# Patient Record
Sex: Male | Born: 1955 | Race: White | Hispanic: No | Marital: Married | Smoking: Former smoker
Health system: Southern US, Academic
[De-identification: ages and names within clinical notes are randomized; demographics above are authoritative.]

## PROBLEM LIST (undated history)

## (undated) ENCOUNTER — Encounter (HOSPITAL_COMMUNITY): Admission: RE | Payer: Self-pay | Source: Ambulatory Visit

## (undated) DIAGNOSIS — R12 Heartburn: Secondary | ICD-10-CM

## (undated) DIAGNOSIS — I82409 Acute embolism and thrombosis of unspecified deep veins of unspecified lower extremity: Secondary | ICD-10-CM

## (undated) DIAGNOSIS — M479 Spondylosis, unspecified: Secondary | ICD-10-CM

## (undated) DIAGNOSIS — L719 Rosacea, unspecified: Secondary | ICD-10-CM

## (undated) DIAGNOSIS — C439 Malignant melanoma of skin, unspecified: Secondary | ICD-10-CM

## (undated) DIAGNOSIS — G4730 Sleep apnea, unspecified: Secondary | ICD-10-CM

## (undated) DIAGNOSIS — R6889 Other general symptoms and signs: Secondary | ICD-10-CM

## (undated) DIAGNOSIS — H9319 Tinnitus, unspecified ear: Secondary | ICD-10-CM

## (undated) DIAGNOSIS — M1990 Unspecified osteoarthritis, unspecified site: Secondary | ICD-10-CM

## (undated) DIAGNOSIS — M47815 Spondylosis without myelopathy or radiculopathy, thoracolumbar region: Secondary | ICD-10-CM

## (undated) DIAGNOSIS — I82502 Chronic embolism and thrombosis of unspecified deep veins of left lower extremity: Secondary | ICD-10-CM

## (undated) DIAGNOSIS — K589 Irritable bowel syndrome without diarrhea: Secondary | ICD-10-CM

## (undated) DIAGNOSIS — G8929 Other chronic pain: Secondary | ICD-10-CM

## (undated) DIAGNOSIS — Z6841 Body Mass Index (BMI) 40.0 and over, adult: Secondary | ICD-10-CM

## (undated) DIAGNOSIS — Z973 Presence of spectacles and contact lenses: Secondary | ICD-10-CM

## (undated) DIAGNOSIS — K219 Gastro-esophageal reflux disease without esophagitis: Secondary | ICD-10-CM

## (undated) DIAGNOSIS — E785 Hyperlipidemia, unspecified: Secondary | ICD-10-CM

## (undated) DIAGNOSIS — M539 Dorsopathy, unspecified: Secondary | ICD-10-CM

## (undated) DIAGNOSIS — I10 Essential (primary) hypertension: Secondary | ICD-10-CM

## (undated) DIAGNOSIS — Z9989 Dependence on other enabling machines and devices: Secondary | ICD-10-CM

## (undated) DIAGNOSIS — L309 Dermatitis, unspecified: Secondary | ICD-10-CM

## (undated) DIAGNOSIS — R569 Unspecified convulsions: Secondary | ICD-10-CM

## (undated) DIAGNOSIS — K635 Polyp of colon: Secondary | ICD-10-CM

## (undated) DIAGNOSIS — G44009 Cluster headache syndrome, unspecified, not intractable: Secondary | ICD-10-CM

## (undated) DIAGNOSIS — E7800 Pure hypercholesterolemia, unspecified: Secondary | ICD-10-CM

## (undated) DIAGNOSIS — M542 Cervicalgia: Secondary | ICD-10-CM

## (undated) DIAGNOSIS — Z659 Problem related to unspecified psychosocial circumstances: Secondary | ICD-10-CM

## (undated) DIAGNOSIS — M5134 Other intervertebral disc degeneration, thoracic region: Secondary | ICD-10-CM

## (undated) DIAGNOSIS — M545 Low back pain, unspecified: Secondary | ICD-10-CM

## (undated) HISTORY — PX: GALLBLADDER SURGERY: SHX652

## (undated) HISTORY — PX: HX ERCP: SHX60

## (undated) HISTORY — DX: Irritable bowel syndrome, unspecified: K58.9

## (undated) HISTORY — DX: Body Mass Index (BMI) 40.0 and over, adult: Z684

## (undated) HISTORY — DX: Spondylosis, unspecified: M47.9

## (undated) HISTORY — DX: Malignant melanoma of skin, unspecified: C43.9

## (undated) HISTORY — DX: Morbid (severe) obesity due to excess calories (CMS HCC): E66.01

## (undated) HISTORY — DX: Hyperlipidemia, unspecified: E78.5

## (undated) HISTORY — PX: HX ANKLE FRACTURE TX: SHX122

## (undated) HISTORY — PX: HX APPENDECTOMY: SHX54

## (undated) HISTORY — PX: PB COLONOSCOPY,DIAGNOSTIC: 45378

## (undated) HISTORY — DX: Chronic embolism and thrombosis of unspecified deep veins of left lower extremity (CMS HCC): I82.502

## (undated) HISTORY — DX: Cluster headache syndrome, unspecified, not intractable: G44.009

## (undated) HISTORY — DX: Pure hypercholesterolemia, unspecified: E78.00

## (undated) HISTORY — DX: Low back pain, unspecified: M54.50

## (undated) HISTORY — DX: Low back pain: M54.5

## (undated) HISTORY — DX: Cervicalgia: M54.2

## (undated) HISTORY — DX: Problem related to unspecified psychosocial circumstances: Z65.9

## (undated) HISTORY — PX: COLONOSCOPY: SHX174

## (undated) HISTORY — DX: Polyp of colon: K63.5

## (undated) HISTORY — PX: CERVICAL DISCECTOMY: SHX98

## (undated) HISTORY — PX: KNEE ARTHROSCOPY: SUR90

## (undated) SURGERY — IR ARTHROGRAM - HIP LEFT
Laterality: Left

---
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
## 1988-02-17 ENCOUNTER — Encounter (HOSPITAL_COMMUNITY): Payer: Self-pay | Admitting: Internal Medicine

## 1997-11-12 ENCOUNTER — Ambulatory Visit (INDEPENDENT_AMBULATORY_CARE_PROVIDER_SITE_OTHER): Payer: Self-pay

## 1997-11-14 ENCOUNTER — Ambulatory Visit (INDEPENDENT_AMBULATORY_CARE_PROVIDER_SITE_OTHER): Payer: Self-pay

## 1997-12-11 ENCOUNTER — Ambulatory Visit (INDEPENDENT_AMBULATORY_CARE_PROVIDER_SITE_OTHER): Payer: Self-pay

## 1998-01-16 ENCOUNTER — Ambulatory Visit (INDEPENDENT_AMBULATORY_CARE_PROVIDER_SITE_OTHER): Payer: Self-pay | Admitting: Orthopaedic Surgery

## 1998-02-27 ENCOUNTER — Ambulatory Visit (INDEPENDENT_AMBULATORY_CARE_PROVIDER_SITE_OTHER): Payer: Self-pay | Admitting: Orthopaedic Surgery

## 1998-03-12 ENCOUNTER — Ambulatory Visit (INDEPENDENT_AMBULATORY_CARE_PROVIDER_SITE_OTHER): Payer: Self-pay

## 1998-04-01 ENCOUNTER — Ambulatory Visit (INDEPENDENT_AMBULATORY_CARE_PROVIDER_SITE_OTHER): Payer: Self-pay | Admitting: Orthopaedic Surgery

## 1998-04-16 ENCOUNTER — Ambulatory Visit (INDEPENDENT_AMBULATORY_CARE_PROVIDER_SITE_OTHER): Payer: Self-pay

## 1999-04-15 ENCOUNTER — Ambulatory Visit (INDEPENDENT_AMBULATORY_CARE_PROVIDER_SITE_OTHER): Payer: Self-pay | Admitting: Family Medicine

## 1999-07-11 ENCOUNTER — Ambulatory Visit (INDEPENDENT_AMBULATORY_CARE_PROVIDER_SITE_OTHER): Payer: Self-pay

## 2000-09-14 ENCOUNTER — Ambulatory Visit (INDEPENDENT_AMBULATORY_CARE_PROVIDER_SITE_OTHER): Payer: Self-pay | Admitting: Family Medicine

## 2000-09-22 ENCOUNTER — Ambulatory Visit (INDEPENDENT_AMBULATORY_CARE_PROVIDER_SITE_OTHER): Payer: Self-pay | Admitting: Family Medicine

## 2000-09-22 ENCOUNTER — Other Ambulatory Visit: Payer: Self-pay

## 2000-11-02 ENCOUNTER — Ambulatory Visit (INDEPENDENT_AMBULATORY_CARE_PROVIDER_SITE_OTHER): Payer: Self-pay

## 2001-05-23 ENCOUNTER — Ambulatory Visit (INDEPENDENT_AMBULATORY_CARE_PROVIDER_SITE_OTHER): Payer: Self-pay

## 2001-05-23 ENCOUNTER — Other Ambulatory Visit: Payer: Self-pay

## 2001-05-26 ENCOUNTER — Ambulatory Visit (HOSPITAL_COMMUNITY): Payer: Self-pay

## 2001-05-30 ENCOUNTER — Ambulatory Visit (INDEPENDENT_AMBULATORY_CARE_PROVIDER_SITE_OTHER): Payer: Self-pay

## 2001-06-09 ENCOUNTER — Ambulatory Visit (INDEPENDENT_AMBULATORY_CARE_PROVIDER_SITE_OTHER): Payer: Self-pay

## 2001-06-17 ENCOUNTER — Encounter: Payer: Self-pay | Admitting: Neurosurgery

## 2001-06-21 ENCOUNTER — Encounter: Payer: Self-pay | Admitting: Neurosurgery

## 2001-06-21 ENCOUNTER — Inpatient Hospital Stay (HOSPITAL_COMMUNITY): Admission: RE | Admit: 2001-06-21 | Discharge: 2001-06-24 | Payer: Self-pay | Admitting: Neurosurgery

## 2001-06-23 ENCOUNTER — Ambulatory Visit (INDEPENDENT_AMBULATORY_CARE_PROVIDER_SITE_OTHER): Payer: Self-pay

## 2001-06-23 ENCOUNTER — Other Ambulatory Visit: Payer: Self-pay

## 2001-07-14 ENCOUNTER — Ambulatory Visit (INDEPENDENT_AMBULATORY_CARE_PROVIDER_SITE_OTHER): Payer: Self-pay

## 2001-08-29 ENCOUNTER — Ambulatory Visit (INDEPENDENT_AMBULATORY_CARE_PROVIDER_SITE_OTHER): Payer: Self-pay

## 2001-09-15 ENCOUNTER — Inpatient Hospital Stay (HOSPITAL_COMMUNITY): Admission: EM | Admit: 2001-09-15 | Discharge: 2001-09-19 | Payer: Self-pay | Admitting: *Deleted

## 2001-09-16 ENCOUNTER — Encounter: Payer: Self-pay | Admitting: Internal Medicine

## 2001-11-28 ENCOUNTER — Ambulatory Visit (INDEPENDENT_AMBULATORY_CARE_PROVIDER_SITE_OTHER): Payer: Self-pay

## 2002-01-12 ENCOUNTER — Inpatient Hospital Stay (HOSPITAL_COMMUNITY): Admission: EM | Admit: 2002-01-12 | Discharge: 2002-01-18 | Payer: Self-pay | Admitting: Emergency Medicine

## 2002-01-12 ENCOUNTER — Encounter: Payer: Self-pay | Admitting: Emergency Medicine

## 2002-01-16 ENCOUNTER — Encounter: Payer: Self-pay | Admitting: Gastroenterology

## 2002-01-17 ENCOUNTER — Encounter (INDEPENDENT_AMBULATORY_CARE_PROVIDER_SITE_OTHER): Payer: Self-pay | Admitting: *Deleted

## 2002-01-23 ENCOUNTER — Ambulatory Visit (HOSPITAL_BASED_OUTPATIENT_CLINIC_OR_DEPARTMENT_OTHER): Payer: Self-pay

## 2002-03-20 ENCOUNTER — Ambulatory Visit (INDEPENDENT_AMBULATORY_CARE_PROVIDER_SITE_OTHER): Payer: Self-pay | Admitting: Family Medicine

## 2002-04-19 ENCOUNTER — Encounter: Payer: Self-pay | Admitting: General Surgery

## 2002-04-19 ENCOUNTER — Encounter: Payer: Self-pay | Admitting: *Deleted

## 2002-04-19 ENCOUNTER — Inpatient Hospital Stay (HOSPITAL_COMMUNITY): Admission: EM | Admit: 2002-04-19 | Discharge: 2002-04-23 | Payer: Self-pay | Admitting: Emergency Medicine

## 2002-04-22 ENCOUNTER — Encounter: Payer: Self-pay | Admitting: *Deleted

## 2002-05-11 ENCOUNTER — Ambulatory Visit (HOSPITAL_COMMUNITY): Admission: RE | Admit: 2002-05-11 | Discharge: 2002-05-11 | Payer: Self-pay | Admitting: Gastroenterology

## 2002-05-11 ENCOUNTER — Encounter (INDEPENDENT_AMBULATORY_CARE_PROVIDER_SITE_OTHER): Payer: Self-pay | Admitting: *Deleted

## 2002-06-02 ENCOUNTER — Ambulatory Visit (INDEPENDENT_AMBULATORY_CARE_PROVIDER_SITE_OTHER): Payer: Self-pay | Admitting: Family Medicine

## 2002-06-30 ENCOUNTER — Ambulatory Visit (INDEPENDENT_AMBULATORY_CARE_PROVIDER_SITE_OTHER): Payer: Self-pay | Admitting: Family Medicine

## 2002-10-27 ENCOUNTER — Ambulatory Visit (INDEPENDENT_AMBULATORY_CARE_PROVIDER_SITE_OTHER): Payer: Self-pay | Admitting: Family Medicine

## 2002-11-13 ENCOUNTER — Ambulatory Visit (INDEPENDENT_AMBULATORY_CARE_PROVIDER_SITE_OTHER): Payer: Self-pay | Admitting: Family Medicine

## 2003-01-05 ENCOUNTER — Ambulatory Visit (INDEPENDENT_AMBULATORY_CARE_PROVIDER_SITE_OTHER): Payer: Self-pay

## 2003-03-05 ENCOUNTER — Ambulatory Visit (INDEPENDENT_AMBULATORY_CARE_PROVIDER_SITE_OTHER): Payer: Self-pay | Admitting: Family Medicine

## 2003-04-02 ENCOUNTER — Ambulatory Visit (INDEPENDENT_AMBULATORY_CARE_PROVIDER_SITE_OTHER): Payer: Self-pay | Admitting: Family Medicine

## 2003-05-24 ENCOUNTER — Ambulatory Visit (INDEPENDENT_AMBULATORY_CARE_PROVIDER_SITE_OTHER): Payer: Self-pay | Admitting: Family Medicine

## 2003-06-13 ENCOUNTER — Ambulatory Visit (INDEPENDENT_AMBULATORY_CARE_PROVIDER_SITE_OTHER): Payer: Self-pay | Admitting: Family Medicine

## 2003-06-17 ENCOUNTER — Ambulatory Visit (INDEPENDENT_AMBULATORY_CARE_PROVIDER_SITE_OTHER): Payer: Self-pay

## 2003-06-26 ENCOUNTER — Ambulatory Visit (INDEPENDENT_AMBULATORY_CARE_PROVIDER_SITE_OTHER): Payer: Self-pay | Admitting: Family Medicine

## 2003-07-24 ENCOUNTER — Ambulatory Visit (HOSPITAL_COMMUNITY): Admission: RE | Admit: 2003-07-24 | Discharge: 2003-07-24 | Payer: Self-pay | Admitting: Gastroenterology

## 2004-08-22 ENCOUNTER — Encounter: Admission: RE | Admit: 2004-08-22 | Discharge: 2004-08-22 | Payer: Self-pay | Admitting: Gastroenterology

## 2004-08-25 ENCOUNTER — Ambulatory Visit (INDEPENDENT_AMBULATORY_CARE_PROVIDER_SITE_OTHER): Payer: Self-pay | Admitting: Family Medicine

## 2004-09-26 ENCOUNTER — Emergency Department (HOSPITAL_COMMUNITY): Admission: EM | Admit: 2004-09-26 | Discharge: 2004-09-26 | Payer: Self-pay | Admitting: Emergency Medicine

## 2004-09-26 ENCOUNTER — Ambulatory Visit: Payer: Self-pay | Admitting: Psychiatry

## 2004-09-26 ENCOUNTER — Inpatient Hospital Stay (HOSPITAL_COMMUNITY): Admission: RE | Admit: 2004-09-26 | Discharge: 2004-09-29 | Payer: Self-pay | Admitting: Psychiatry

## 2004-12-25 ENCOUNTER — Encounter: Admission: RE | Admit: 2004-12-25 | Discharge: 2004-12-25 | Payer: Self-pay | Admitting: Orthopedic Surgery

## 2005-07-07 ENCOUNTER — Ambulatory Visit (INDEPENDENT_AMBULATORY_CARE_PROVIDER_SITE_OTHER): Payer: Self-pay | Admitting: Family Medicine

## 2005-11-06 ENCOUNTER — Ambulatory Visit (INDEPENDENT_AMBULATORY_CARE_PROVIDER_SITE_OTHER): Payer: Self-pay | Admitting: Family Medicine

## 2006-01-02 ENCOUNTER — Ambulatory Visit (INDEPENDENT_AMBULATORY_CARE_PROVIDER_SITE_OTHER): Payer: Self-pay

## 2006-03-03 ENCOUNTER — Ambulatory Visit (INDEPENDENT_AMBULATORY_CARE_PROVIDER_SITE_OTHER): Payer: Self-pay | Admitting: Family Medicine

## 2006-03-04 ENCOUNTER — Ambulatory Visit (HOSPITAL_COMMUNITY): Payer: Self-pay | Admitting: GASTROENTEROLOGY

## 2006-04-30 ENCOUNTER — Encounter (FREE_STANDING_LABORATORY_FACILITY)
Admit: 2006-04-30 | Discharge: 2006-04-30 | Disposition: A | Discharge status: Home / Self Care | Payer: BC Managed Care – PPO | Attending: Emergency Medicine | Admitting: Emergency Medicine

## 2006-04-30 ENCOUNTER — Ambulatory Visit (INDEPENDENT_AMBULATORY_CARE_PROVIDER_SITE_OTHER): Payer: BC Managed Care – PPO

## 2006-04-30 DIAGNOSIS — R070 Pain in throat: Secondary | ICD-10-CM | POA: Insufficient documentation

## 2006-08-05 ENCOUNTER — Other Ambulatory Visit (INDEPENDENT_AMBULATORY_CARE_PROVIDER_SITE_OTHER): Payer: Self-pay | Admitting: Family Medicine

## 2006-10-22 ENCOUNTER — Other Ambulatory Visit (INDEPENDENT_AMBULATORY_CARE_PROVIDER_SITE_OTHER): Payer: BC Managed Care – PPO | Admitting: Family Medicine

## 2006-10-22 ENCOUNTER — Ambulatory Visit
Admission: RE | Admit: 2006-10-22 | Discharge: 2006-10-22 | Disposition: A | Discharge status: Home / Self Care | Payer: BC Managed Care – PPO | Attending: Family Medicine | Admitting: Family Medicine

## 2006-10-22 ENCOUNTER — Other Ambulatory Visit (INDEPENDENT_AMBULATORY_CARE_PROVIDER_SITE_OTHER): Payer: Self-pay | Admitting: Family Medicine

## 2006-10-22 DIAGNOSIS — M1710 Unilateral primary osteoarthritis, unspecified knee: Secondary | ICD-10-CM | POA: Insufficient documentation

## 2006-10-22 DIAGNOSIS — M25569 Pain in unspecified knee: Secondary | ICD-10-CM | POA: Insufficient documentation

## 2006-11-22 ENCOUNTER — Other Ambulatory Visit (INDEPENDENT_AMBULATORY_CARE_PROVIDER_SITE_OTHER): Payer: BC Managed Care – PPO | Admitting: Family Medicine

## 2006-12-10 ENCOUNTER — Other Ambulatory Visit (INDEPENDENT_AMBULATORY_CARE_PROVIDER_SITE_OTHER): Payer: BC Managed Care – PPO | Admitting: Family Medicine

## 2006-12-17 ENCOUNTER — Other Ambulatory Visit (INDEPENDENT_AMBULATORY_CARE_PROVIDER_SITE_OTHER): Payer: BC Managed Care – PPO | Admitting: Family Medicine

## 2007-04-22 ENCOUNTER — Other Ambulatory Visit (INDEPENDENT_AMBULATORY_CARE_PROVIDER_SITE_OTHER): Payer: BC Managed Care – PPO | Admitting: Family Medicine

## 2007-10-05 ENCOUNTER — Ambulatory Visit (INDEPENDENT_AMBULATORY_CARE_PROVIDER_SITE_OTHER): Payer: BC Managed Care – PPO

## 2007-10-25 ENCOUNTER — Ambulatory Visit (INDEPENDENT_AMBULATORY_CARE_PROVIDER_SITE_OTHER): Payer: BC Managed Care – PPO | Admitting: Family Medicine

## 2007-10-25 VITALS — BP 130/82 | HR 76 | Temp 99.8°F | Wt 278.0 lb

## 2007-10-25 MED ORDER — ATORVASTATIN 20 MG TABLET
20.00 mg | ORAL_TABLET | Freq: Every evening | ORAL | Status: DC
Start: 2007-10-25 — End: 2009-01-03

## 2007-10-25 NOTE — Progress Notes (Signed)
 Subjective:     Patient ID:  Henry [name] is an 52 y.o. male   Chief Complaint:  Chief Complaint   Patient presents with   . Ear Pain     otits         Ear Pain     The history is provided by the patient. This is a new problem. The current episode started 1 to 4 weeks ago. The problem has been occurring constantly. There is pain in both ears. The problem has been gradually improving since onset. There has been no fever. The pain is at a severity of 2/10. The pain is mild. There has been drainage and hearing loss. Past medical history is significant for no tympanostomy tube.       Review of Systems   HENT: Positive for hearing loss and ear pain.      Objective:   Physical Exam   Constitutional: He appears well-developed and well-nourished.   HENT:   Head: Normocephalic.   Right Ear: External ear normal.   Left Ear: External ear normal.        Tm retracted on both sides slight fluid no signs of infection   Cardiovascular: Normal rate and regular rhythm.    Pulm:  Effort normal, breath sounds normal.      .    Assessment & Plan:     388.70B Ear Pain  (primary encounter diagnosis)  Comment: Normal examination some slight fluid noted likely related to eustachian tube dysfunction no evidence for infection.  Plan: I gave him samples of a nonsedating antihistamine with Sudafed to take for one week then he will return to his regular dosing of Zyrtec.

## 2007-11-04 ENCOUNTER — Other Ambulatory Visit (INDEPENDENT_AMBULATORY_CARE_PROVIDER_SITE_OTHER): Payer: Self-pay | Admitting: Family Medicine

## 2007-11-04 MED ORDER — METOPROLOL SUCCINATE ER 25 MG TABLET,EXTENDED RELEASE 24 HR
25.0000 mg | ORAL_TABLET | Freq: Every day | ORAL | Status: DC
Start: 2007-11-04 — End: 2007-11-07

## 2007-11-04 MED ORDER — DICYCLOMINE 20 MG TABLET
20.00 mg | ORAL_TABLET | Freq: Four times a day (QID) | ORAL | Status: DC
Start: 2007-11-04 — End: 2007-11-07

## 2007-11-05 MED ORDER — EZETIMIBE 10 MG-SIMVASTATIN 20 MG TABLET
1.0000 | ORAL_TABLET | Freq: Every day | ORAL | Status: DC
Start: 2007-11-04 — End: 2008-03-12

## 2007-11-07 MED ORDER — DICYCLOMINE 20 MG TABLET
20.00 mg | ORAL_TABLET | Freq: Four times a day (QID) | ORAL | Status: DC
Start: 2007-11-07 — End: 2008-11-05

## 2007-11-07 MED ORDER — METOPROLOL SUCCINATE ER 25 MG TABLET,EXTENDED RELEASE 24 HR
25.0000 mg | ORAL_TABLET | Freq: Every day | ORAL | Status: DC
Start: 2007-11-07 — End: 2008-11-05

## 2007-11-09 ENCOUNTER — Telehealth (INDEPENDENT_AMBULATORY_CARE_PROVIDER_SITE_OTHER): Payer: Self-pay | Admitting: Family Medicine

## 2007-11-09 NOTE — Telephone Encounter (Signed)
Vytorin 10/20 mg requires a prior auth.   Call:  [phone].  ID# [id number].  Pt must use a generic or brand Crestor or Lipitor.

## 2007-11-14 ENCOUNTER — Encounter (INDEPENDENT_AMBULATORY_CARE_PROVIDER_SITE_OTHER): Payer: BC Managed Care – PPO | Admitting: Family Medicine

## 2008-03-06 ENCOUNTER — Ambulatory Visit
Admission: RE | Admit: 2008-03-06 | Discharge: 2008-03-06 | Disposition: A | Discharge status: Home / Self Care | Payer: BC Managed Care – PPO | Attending: Family Medicine | Admitting: Family Medicine

## 2008-03-06 LAB — CREATININE WITH EGFR
CREATININE: 0.92 mg/dL (ref 0.62–1.27)
ESTIMATED GLOMERULAR FILTRATION RATE: >59 ml/min/1.73m2 (ref >59)

## 2008-03-06 LAB — LIPID PANEL
CHOLESTEROL: 124 mg/dL (ref <200)
HDL-CHOLESTEROL: 37 mg/dL — ABNORMAL LOW (ref >39)
LDL (CALCULATED): 56 mg/dL (ref <100)
TRIGLYCERIDES: 156 mg/dL — ABNORMAL HIGH (ref <150)

## 2008-03-06 LAB — CREATINE KINASE (CK), TOTAL, SERUM OR PLASMA: CREATINE KINASE (CK): 90 U/L (ref 48–222)

## 2008-03-06 LAB — GLUCOSE FASTING: GLUCOSE, FASTING: 95 mg/dL (ref 70–105)

## 2008-03-06 LAB — ELECTROLYTES
ANION GAP: 6 mmol/L (ref 5–16)
CARBON DIOXIDE: 29 mmol/L (ref 23–33)
CHLORIDE: 104 mmol/L (ref 96–111)
POTASSIUM: 4.6 mmol/L (ref 3.5–5.1)
SODIUM: 139 mmol/L (ref 136–145)

## 2008-03-06 LAB — AST (SGOT): AST (SGOT): 26 U/L (ref 10–40)

## 2008-03-06 LAB — ALT (SGPT): ALT (SGPT): 43 U/L (ref 10–46)

## 2008-03-06 LAB — PSA SCREENING: PROSTATE SPECIFIC AG: 3.8 ng/mL (ref 0.0–4.0)

## 2008-03-06 LAB — BUN
BUN/CREAT RATIO: 10 (ref 6–22)
BUN: 9 mg/dL (ref 8–26)

## 2008-03-12 ENCOUNTER — Ambulatory Visit (INDEPENDENT_AMBULATORY_CARE_PROVIDER_SITE_OTHER): Payer: BC Managed Care – PPO | Admitting: Family Medicine

## 2008-03-12 NOTE — Progress Notes (Signed)
 Subjective:     Patient ID:  Henry [name] is an 53 y.o. male   Chief Complaint:  Chief Complaint   Patient presents with   . Hypertension   . Results     Lab work   . Non Cardiac Chest Pain         Hypertension     The history is provided by the patient. This is a chronic problem. The current episode started more than 1 year ago. The problem has been unchanged since onset. There has been chest pain.  There has been no orthopnea, no palpitations and no PND. Agents associated with hypertension include none. Risk factors include obesity and male gender.   Non Cardiac Chest Pain  The history is provided by the patient. This is a new problem. The current episode started more than 2 days ago. The problem has been unchanged since onset. Associated symptoms include chest pain. The symptoms are worsened by nothing. The symptoms are relieved by nothing. He has tried nothing for the symptoms.       Review of Systems   Cardiovascular: Positive for chest pain. Negative for palpitations, orthopnea, claudication, leg swelling and PND.   Respiratory: Negative for cough, hemoptysis and sputum production.      Objective:   Physical Exam   Constitutional: He is oriented. He appears well-developed and well-nourished.   HENT:   Head: Normocephalic and atraumatic.   Eyes: Pupils are equal, round, and reactive to light.   Neck: Normal range of motion.   Cardiovascular: Normal rate, regular rhythm and normal heart sounds.  Exam reveals no friction rub.    No murmur heard.       12-lead EKG obtained in the clinic revealed normal sinus rhythm normal EKG no acute processes.   Pulm:  Effort normal, breath sounds normal.    Abdomen:   Bowel sounds are normal. Soft.   Ortho/Musculoskeletal:   He exhibits no edema.   Neurological: He is alert and oriented.     .    Assessment & Plan:     786.20F Chest Pain  Comment: Chest pain most likely noncardiac but in a male over 50 but overweight I think it's prudent to get an exercise stress test.  Plan:  STRESS TEST - EXERCISE - ADULT            272.0 Hypercholesterolemia  Comment: Reviewed patient's cholesterol profile his total cholesterol HDL ratio is 3:20 PM at less than average risk  Plan: STRESS TEST - EXERCISE - ADULT        Plan as above    401.9 Hypertension  Comment: Blood pressure today except double  Plan: STRESS TEST - EXERCISE - ADULT        Continue current medication.

## 2008-03-26 ENCOUNTER — Ambulatory Visit
Admission: RE | Admit: 2008-03-26 | Discharge: 2008-03-26 | Disposition: A | Discharge status: Home / Self Care | Payer: BC Managed Care – PPO | Attending: Family Medicine | Admitting: Family Medicine

## 2008-03-26 DIAGNOSIS — R079 Chest pain, unspecified: Secondary | ICD-10-CM | POA: Insufficient documentation

## 2008-03-26 DIAGNOSIS — E7800 Pure hypercholesterolemia, unspecified: Secondary | ICD-10-CM | POA: Insufficient documentation

## 2008-03-26 DIAGNOSIS — I10 Essential (primary) hypertension: Secondary | ICD-10-CM | POA: Insufficient documentation

## 2008-05-14 ENCOUNTER — Encounter (INDEPENDENT_AMBULATORY_CARE_PROVIDER_SITE_OTHER): Payer: BC Managed Care – PPO | Admitting: Family Medicine

## 2008-05-14 ENCOUNTER — Encounter (INDEPENDENT_AMBULATORY_CARE_PROVIDER_SITE_OTHER): Payer: Self-pay | Admitting: Family Medicine

## 2008-05-14 ENCOUNTER — Ambulatory Visit (INDEPENDENT_AMBULATORY_CARE_PROVIDER_SITE_OTHER): Payer: BC Managed Care – PPO | Admitting: Family Medicine

## 2008-05-28 ENCOUNTER — Other Ambulatory Visit (INDEPENDENT_AMBULATORY_CARE_PROVIDER_SITE_OTHER): Payer: Self-pay | Admitting: Family Medicine

## 2008-05-28 MED ORDER — NAPROXEN 500 MG TABLET
500.00 mg | ORAL_TABLET | Freq: Two times a day (BID) | ORAL | Status: DC
Start: 2008-05-28 — End: 2009-07-24

## 2008-05-30 NOTE — Telephone Encounter (Addendum)
Naproxen E-rx by doctor.

## 2008-07-23 ENCOUNTER — Encounter: Admission: RE | Admit: 2008-07-23 | Discharge: 2008-07-23 | Payer: Self-pay | Admitting: Family Medicine

## 2008-09-24 ENCOUNTER — Encounter (INDEPENDENT_AMBULATORY_CARE_PROVIDER_SITE_OTHER): Payer: BC Managed Care – PPO | Admitting: Family Medicine

## 2008-10-01 ENCOUNTER — Encounter (INDEPENDENT_AMBULATORY_CARE_PROVIDER_SITE_OTHER): Payer: BC Managed Care – PPO | Admitting: Family Medicine

## 2008-10-03 ENCOUNTER — Encounter (INDEPENDENT_AMBULATORY_CARE_PROVIDER_SITE_OTHER): Payer: BC Managed Care – PPO | Admitting: Family Medicine

## 2008-10-09 ENCOUNTER — Ambulatory Visit (INDEPENDENT_AMBULATORY_CARE_PROVIDER_SITE_OTHER): Payer: BC Managed Care – PPO | Admitting: Family Medicine

## 2008-11-05 ENCOUNTER — Other Ambulatory Visit (INDEPENDENT_AMBULATORY_CARE_PROVIDER_SITE_OTHER): Payer: Self-pay | Admitting: Family Medicine

## 2008-11-05 MED ORDER — METOPROLOL SUCCINATE ER 25 MG TABLET,EXTENDED RELEASE 24 HR
25.0000 mg | ORAL_TABLET | Freq: Every day | ORAL | Status: DC
Start: 2008-11-05 — End: 2009-10-29

## 2008-11-05 MED ORDER — DICYCLOMINE 20 MG TABLET
20.00 mg | ORAL_TABLET | Freq: Four times a day (QID) | ORAL | Status: DC
Start: 2008-11-05 — End: 2010-01-29

## 2008-11-07 MED ORDER — METOPROLOL SUCCINATE ER 25 MG TABLET,EXTENDED RELEASE 24 HR
25.0000 mg | ORAL_TABLET | Freq: Every day | ORAL | Status: DC
Start: 2008-11-05 — End: 2010-01-27

## 2008-11-07 MED ORDER — DICYCLOMINE 20 MG TABLET
20.00 mg | ORAL_TABLET | Freq: Four times a day (QID) | ORAL | Status: DC
Start: 2008-11-05 — End: 2009-10-29

## 2008-11-08 NOTE — Telephone Encounter (Signed)
Bentyl, and toprol xl e-rx by doctor.

## 2008-11-09 ENCOUNTER — Encounter: Admission: RE | Admit: 2008-11-09 | Discharge: 2008-11-09 | Payer: Self-pay | Admitting: Family Medicine

## 2009-01-03 ENCOUNTER — Other Ambulatory Visit (INDEPENDENT_AMBULATORY_CARE_PROVIDER_SITE_OTHER): Payer: Self-pay | Admitting: Family Medicine

## 2009-01-04 MED ORDER — ATORVASTATIN 20 MG TABLET
20.00 mg | ORAL_TABLET | Freq: Every evening | ORAL | Status: DC
Start: 2009-01-03 — End: 2009-03-21

## 2009-01-07 NOTE — Telephone Encounter (Addendum)
Lipitor 62m e-rx by doctor.

## 2009-03-21 ENCOUNTER — Other Ambulatory Visit (INDEPENDENT_AMBULATORY_CARE_PROVIDER_SITE_OTHER): Payer: Self-pay | Admitting: Family Medicine

## 2009-03-21 MED ORDER — ATORVASTATIN 20 MG TABLET
20.00 mg | ORAL_TABLET | Freq: Every evening | ORAL | Status: DC
Start: 2009-03-21 — End: 2010-07-07

## 2009-03-21 NOTE — Telephone Encounter (Signed)
Lipitor e-rxd by doctor.

## 2009-04-10 ENCOUNTER — Encounter (INDEPENDENT_AMBULATORY_CARE_PROVIDER_SITE_OTHER): Payer: BC Managed Care – PPO | Admitting: Family Medicine

## 2009-06-07 ENCOUNTER — Other Ambulatory Visit (INDEPENDENT_AMBULATORY_CARE_PROVIDER_SITE_OTHER): Payer: Self-pay | Admitting: Family Medicine

## 2009-06-10 ENCOUNTER — Encounter (INDEPENDENT_AMBULATORY_CARE_PROVIDER_SITE_OTHER): Payer: BC Managed Care – PPO | Admitting: Family Medicine

## 2009-06-27 ENCOUNTER — Encounter: Admission: RE | Admit: 2009-06-27 | Discharge: 2009-06-27 | Payer: Self-pay | Admitting: Family Medicine

## 2009-07-02 ENCOUNTER — Encounter (INDEPENDENT_AMBULATORY_CARE_PROVIDER_SITE_OTHER): Payer: BC Managed Care – PPO | Admitting: Family Medicine

## 2009-07-11 ENCOUNTER — Inpatient Hospital Stay (HOSPITAL_BASED_OUTPATIENT_CLINIC_OR_DEPARTMENT_OTHER): Admission: RE | Admit: 2009-07-11 | Discharge: 2009-07-11 | Payer: Self-pay | Admitting: Interventional Cardiology

## 2009-07-13 ENCOUNTER — Emergency Department (HOSPITAL_COMMUNITY): Admission: EM | Admit: 2009-07-13 | Discharge: 2009-07-13 | Payer: Self-pay | Admitting: Emergency Medicine

## 2009-07-24 ENCOUNTER — Other Ambulatory Visit (INDEPENDENT_AMBULATORY_CARE_PROVIDER_SITE_OTHER): Payer: Self-pay | Admitting: Family Medicine

## 2009-07-24 MED ORDER — NAPROXEN 500 MG TABLET
500.0000 mg | ORAL_TABLET | Freq: Two times a day (BID) | ORAL | Status: DC
Start: 2009-07-24 — End: 2009-10-04

## 2009-08-04 DIAGNOSIS — R45851 Suicidal ideations: Secondary | ICD-10-CM | POA: Insufficient documentation

## 2009-08-07 ENCOUNTER — Emergency Department (HOSPITAL_COMMUNITY): Admission: EM | Admit: 2009-08-07 | Discharge: 2009-08-08 | Payer: Self-pay | Admitting: Emergency Medicine

## 2009-08-08 ENCOUNTER — Emergency Department (HOSPITAL_COMMUNITY): Admission: EM | Admit: 2009-08-08 | Discharge: 2009-08-08 | Payer: Self-pay | Admitting: Emergency Medicine

## 2009-08-09 ENCOUNTER — Emergency Department (HOSPITAL_COMMUNITY): Admission: EM | Admit: 2009-08-09 | Discharge: 2009-08-10 | Payer: Self-pay | Admitting: Emergency Medicine

## 2009-08-10 ENCOUNTER — Emergency Department (HOSPITAL_COMMUNITY): Admission: EM | Admit: 2009-08-10 | Discharge: 2009-08-10 | Payer: Self-pay | Admitting: Emergency Medicine

## 2009-08-12 ENCOUNTER — Emergency Department (HOSPITAL_COMMUNITY): Admission: EM | Admit: 2009-08-12 | Discharge: 2009-08-12 | Payer: Self-pay | Admitting: Emergency Medicine

## 2009-09-25 ENCOUNTER — Ambulatory Visit (INDEPENDENT_AMBULATORY_CARE_PROVIDER_SITE_OTHER): Payer: BC Managed Care – PPO | Admitting: Family Medicine

## 2009-09-25 ENCOUNTER — Ambulatory Visit
Admission: RE | Admit: 2009-09-25 | Discharge: 2009-09-25 | Disposition: A | Discharge status: Home / Self Care | Payer: BC Managed Care – PPO | Attending: Family Medicine | Admitting: Family Medicine

## 2009-09-25 ENCOUNTER — Other Ambulatory Visit (INDEPENDENT_AMBULATORY_CARE_PROVIDER_SITE_OTHER): Payer: Self-pay | Admitting: Family Medicine

## 2009-09-25 VITALS — BP 136/100 | HR 72 | Temp 97.6°F | Wt 293.0 lb

## 2009-09-25 DIAGNOSIS — M25569 Pain in unspecified knee: Secondary | ICD-10-CM | POA: Insufficient documentation

## 2009-09-25 DIAGNOSIS — M1990 Unspecified osteoarthritis, unspecified site: Secondary | ICD-10-CM | POA: Insufficient documentation

## 2009-09-25 NOTE — Progress Notes (Signed)
Subjective:     Patient ID:  Henry [name] is an 54 y.o. male   Chief Complaint:  Chief Complaint   Patient presents with   . Knee Problem     left         HPI Comments: 54 year old gentleman with a history of left knee problems from college who [place] left knee and left hand. The knee and the hand  took full impact. He had immediate pain and discomfort, has treated the pain for 3 weeks with Naprosyn 500 mg twice daily and regular cold gel pack and/or ice. He continues to have pain pain, it. The pain is worse on the medial aspect of the knee. The pain is starting to affect his gait and beginning to affect his right knee. Going down steps and down inclines are most painful although going up steps can also be painful. He's had no giveaway of the joint, no locking of the joint.    Current outpatient prescriptions:  naproxen (NAPROSYN) 500 mg Tab, take 1 Tab by mouth Twice daily with food. Bid  atorvastatin (LIPITOR) 20 mg Tab, take 1 Tab by mouth QPM.   Dicyclomine (BENTYL) 20 mg Tab, take 1 Tab by mouth Four times a day.   metoprolol succinate (TOPROL XL) 25 mg Tb24, take 1 Tab by mouth Once a day.   ASPIR-81 81 mg TbEC, qd  ZANTAC 150 mg Tab, qd  GLUCOS CHOND CPLX ADVANCED ORAL, qd  M-VIT ORAL, qd  metoprolol succinate (TOPROL XL) 25 mg Tb24, take 1 Tab by mouth Once a day.   Dicyclomine (BENTYL) 20 mg Tab, take 1 Tab by mouth Four times a day.   Chondroitin Sulfate A 250 mg Cap, Twice daily.     ALLERGIES:   -- Sulfa (Sulfonamide Antibiotics) -- Rash        Review of Systems   Constitutional: Negative.    Respiratory: Negative.    Cardiovascular: Negative.    Gastrointestinal: Negative.    Genitourinary: Negative.    Musculoskeletal: Positive for joint pain and falls.     Objective:   Physical Exam   Constitutional: He appears well-developed and well-nourished.        BP 136/100   Pulse 72   Temp 36.4 C (97.6 F)   Wt 132.904 kg (293 lb)      Cardiovascular: Normal rate and regular rhythm.    Pulm:  Breath sounds normal.    Abdomen:   Bowel sounds are normal.   Ortho/Musculoskeletal:            Left Knee Exam   He exhibits swelling.   Tenderness found.        Legs:         The small red circle to notes the area of abrasion and erythema. The air oh at the medial joint line is the area of most tenderness.     .    Assessment & Plan:   719.46AS Knee pain, left  (primary encounter diagnosis)  Comment: Continue his nonsteroidal anti-inflammatory as he spent doing I went to reinitiate the cold compress and ice and I ordered 4 views of the left knee and may referral to sports medicine  Plan: XR KNEE LEFT, AMB CONSULT/REFERRAL FAMILY         MED-SPORTS MED            272.4 Hyperlipidemia  Comment: Fasting labs to assess  Plan: AST (SGOT), ALT (  SGPT), CREATINE KINASE, CK,         LIPID PANEL            401.9 Hypertension  Comment: Labs to assess  Plan: ELECTROLYTES, BUN, CREATININE            600.90AJ BPH (benign prostatic hyperplasia)  Comment: Labs to assess  Plan: PSA SCREENING            V77.1D Diabetes mellitus screening  Comment: Labs to assess  Plan: GLUCOSE FASTING, HGA1C (HEMOGLOBIN A1C WITH EST        AVG GLUCOSE)

## 2009-09-27 ENCOUNTER — Emergency Department (HOSPITAL_COMMUNITY)
Admission: EM | Admit: 2009-09-27 | Discharge: 2009-09-27 | Payer: Self-pay | Source: Home / Self Care | Admitting: Emergency Medicine

## 2009-10-02 ENCOUNTER — Encounter (INDEPENDENT_AMBULATORY_CARE_PROVIDER_SITE_OTHER): Payer: BC Managed Care – PPO | Admitting: Family Medicine

## 2009-10-04 ENCOUNTER — Ambulatory Visit (INDEPENDENT_AMBULATORY_CARE_PROVIDER_SITE_OTHER): Payer: BC Managed Care – PPO | Admitting: Family Medicine

## 2009-10-04 DIAGNOSIS — S83209A Unspecified tear of unspecified meniscus, current injury, unspecified knee, initial encounter: Secondary | ICD-10-CM | POA: Insufficient documentation

## 2009-10-04 MED ORDER — DICLOFENAC SODIUM 75 MG TABLET,DELAYED RELEASE
75.00 mg | DELAYED_RELEASE_TABLET | Freq: Two times a day (BID) | ORAL | Status: DC
Start: 2009-10-04 — End: 2009-11-04

## 2009-10-15 NOTE — Progress Notes (Signed)
Sports Medicine at [facility]. [place] [facility]    Progress Note  Subjective:  Henry [name] is a 54 y.o. male new patient to the [place] here for evaluation of left knee.  I am seeing this patient at the request of Dr. Rosemarie Beath, MD, MD. Dr. Rosemarie Beath, MD, MD is consulting me regarding the causes of and treatment options available for the patient's pain. Pain has been present for 2 years, but especially prominent x 3 weeks. There has been antecedent trauma. Date of Injury (DOI) = 3 [place] ago at the beach. Mechanism of injury was: twisted on the sand and fell directly onto the left knee. The pain is localized to the medial aspect of the joint. Rated as 7 out of 10. Worsened with stairs and uneven ground. Associated symptoms include: no effusions, locking, mechanical sx. Prior treatments include: Naprosyn, ice, brace (made for him in the past. These are helpful. The patient is right handed.  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  PFMSHx: I have reviewed and updated as appropriate the past medical, family and social history.   PMHx: HTN, lipids, IBS, BPH. States he has allergies to sulfa drugs.   SocHx: Building services engineer.      Objective:  General WNWD in NAD.   Bilateral knees were examined. ROM = full. There is mild tenderness to palpation of the peripatellar structures. Palpation of the joint lines reveal medial tenderness. There is no effusion on exam today.  The ligament exams are negative. (In particular, the valgus and varus stress tests are normal. Lachman and pivot shift tests are normal. Posterior drawer and posterior sag signs are normal). The meniscus exams are negative in the office today, though he does have significant crepitus.     There is a normal neurovascular assessment distally.   There are no overlying skin erythema, ecchymosis, rashes or lesions.       Assessment:   1. OA knees  2. Degenerative meniscus tear left knee, medial      Plan:  1. Ice 15 minutes QID and prn.  2. I have written a prescription for physical therapy to improve the patient's symptoms and function. This is the primary treatment option for this patient at this time.  3. NSAID's: I recommend Diclofenac 75 mg PO BID with food for at least a week. The patient is to avoid other NSAID's concurrently.  4. Xrays have been ordered and the results are still pending.  5. Brace: continue to use his brace that he has at home. Bring this in next visit. I inadvertently placed a brace order in the system. I tried to cancel this, but Epic would not accept this. We did not fit a brace for him today.   6. Follow up in 6 weeks, sooner if worsening signs or symptoms.     Mackie Pai, MD  Associate Professor  Director, Division of Sports Medicine  Director, Primary Care Sports Medicine Fellowship  Dept of [place] [facility]

## 2009-10-29 ENCOUNTER — Other Ambulatory Visit (INDEPENDENT_AMBULATORY_CARE_PROVIDER_SITE_OTHER): Payer: Self-pay | Admitting: Family Medicine

## 2009-10-29 MED ORDER — METOPROLOL SUCCINATE ER 25 MG TABLET,EXTENDED RELEASE 24 HR
25.0000 mg | ORAL_TABLET | Freq: Every day | ORAL | Status: DC
Start: 2009-10-29 — End: 2011-12-18

## 2009-10-29 MED ORDER — DICYCLOMINE 20 MG TABLET
20.0000 mg | ORAL_TABLET | Freq: Four times a day (QID) | ORAL | Status: DC
Start: 2009-10-29 — End: 2011-01-29

## 2009-10-29 NOTE — Telephone Encounter (Signed)
Toprol XL and Bentyl E-rxd by doctor

## 2009-11-05 MED ORDER — DICLOFENAC SODIUM 75 MG TABLET,DELAYED RELEASE
75.0000 mg | DELAYED_RELEASE_TABLET | Freq: Two times a day (BID) | ORAL | Status: DC
Start: 2009-11-04 — End: 2010-10-30

## 2009-11-05 NOTE — Telephone Encounter (Signed)
Toprol XL, Bentyl, Voltaren E-rxd by doctor.

## 2009-11-15 ENCOUNTER — Ambulatory Visit (INDEPENDENT_AMBULATORY_CARE_PROVIDER_SITE_OTHER): Payer: BC Managed Care – PPO | Admitting: Family Medicine

## 2009-11-15 ENCOUNTER — Ambulatory Visit
Admission: RE | Admit: 2009-11-15 | Discharge: 2009-11-15 | Disposition: A | Discharge status: Home / Self Care | Payer: BC Managed Care – PPO | Attending: Family Medicine | Admitting: Family Medicine

## 2009-11-15 DIAGNOSIS — S6390XA Sprain of unspecified part of unspecified wrist and hand, initial encounter: Secondary | ICD-10-CM | POA: Insufficient documentation

## 2009-11-15 NOTE — Progress Notes (Signed)
Sports Medicine at [facility]. [place]  Department of [place] [facility]    Progress Note  Subjective:  Henry [name] is a 54 y.o. male here for follow up of left knee OA, meniscus tear. Also has new concern of left 4th finger injury. I previously recommended ice, PT and Diclofenac. The patient states the pain is Better. The patient rates the pain as a 2 out of 10, localized to the anterior and medial aspect of the joint. The pain is worse with stairs, prolonged driving. Redness, bruising, swelling or mechanical sx negative.  Paresis or paresthesias negative. The patient has been to 0 sessions of physical therapy. He also describes volar left 4th PIP pain and swelling. Fell at [employer] in July. Not bruised anymore, but still swollen.  Xrays: 09/25/09 reveal moderate OA left knee. No previous hand films.  Other 10-point ROS are negative.   PFMSHx: I have reviewed and updated as appropriate the past medical, family and social history.   PMHx: HTN, lipids, IBS, BPH. States he has allergies to sulfa drugs.   SocHx: Building services engineer.     Objective:   General WNWD in NAD. BP 126/86   Pulse 72  Hips and back are examined. ROM of the hips are full. Strength is normal. TTP none.   Hamstring flexibility is poor.  Bilateral knees were examined. ROM = full. There is mild tenderness to palpation of the peripatellar structures. Palpation of the joint lines reveal medial tenderness. There is no effusion on exam today. The ligament exams are negative. (In particular, the valgus and varus stress tests are normal. Lachman and pivot shift tests are normal. Posterior drawer and posterior sag signs are normal). The meniscus exams are negative in the office today, though he does have significant crepitus.    Bilateral wrists and hands were examined.  ROM of the wrists are full, TTP none.  Watson's scaphoid shift tests are negative. Ulnar grind tests negative. Finklestein's tests are negative. There is no thenar atrophy. Tinel's is negative; Phalen's is negative.    The hands demonstrate normal strength and tone. Deformities are not seen. There is swelling of the left 4th PIP. TTP volar and ulnar collateral lig as well, but no instability.    There is a normal neurovascular assessment distally.   There are no overlying skin erythema, ecchymosis, rashes or lesions.     Assessment:   1. OA knees   2. Degenerative meniscus tear left knee, medial  3. OA hips  4. New left 4th PIP Volar plate injury DOI July 2011      Plan:  1. Ice 15 minutes QID prn, especially at the finger.  2. We discussed the possibility of the patient learning exercises at formal PT at an outside facility. Due to time and schedule constraints the patient chose to learn an aggressive Home Exercise Program from our ATC. Therefore, I have ordered Therapeutic Exercise with our Product/process development scientist.   3. NSAID's: continue Diclofenac 75 mg PO BID with food as needed. The patient is to avoid other NSAID's concurrently.  4. Xrays: ordered new hand films.  5. Brace: no new braces today.   6. Follow up in 6 months, sooner if worsening signs or symptoms.     Mackie Pai, MD  Associate Professor  Director, Division of Sports Medicine  Director, Primary Care Sports Medicine Fellowship  Dept of [place] [facility]

## 2009-11-15 NOTE — Progress Notes (Signed)
Therapeutic Exercise:    After discussing our program of therapeutic exercise treatment and the other available options in the area, the patient wishes to proceed with therapeutic exercise treatment today. I have instructed this patient on therapeutic exercise. I have provided my typical General Knee Pain Exercise Xpress protocol for this patient in written form today. The patient demonstrated proficiency in these exercises in the office today. The patient is to perform these exercises daily, as discussed in the office. This took 30 minutes.      Aaron Edelman L. Larene Beach, MS, ATC  Director of Tour manager of Sports Medicine   Department of Family Medicine      Mackie Pai, MD  Associate Professor  Director, Division of Sports Medicine  Mudlogger, Primary Care Sports Medicine Fellowship  Dept of [place] [facility]

## 2009-11-15 NOTE — Progress Notes (Signed)
Therapeutic Exercise:    After discussing our program of therapeutic exercise treatment and the other available options in the area, the patient wishes to proceed with therapeutic exercise treatment today. I have instructed this patient on therapeutic exercise. I have provided my typical Finger/Hand Exercise Xpress protocol for this patient in written form today. The patient demonstrated proficiency in these exercises in the office today. The patient is to perform these exercises daily, as discussed in the office.  This took 15 minutes.      Aaron Edelman L. Larene Beach, MS, ATC  Director of Tour manager of Sports Medicine   Department of Family Medicine      Mackie Pai, MD  Associate Professor  Director, Division of Sports Medicine  Mudlogger, Primary Care Sports Medicine Fellowship  Dept of [place] [facility]

## 2009-12-13 ENCOUNTER — Ambulatory Visit (HOSPITAL_COMMUNITY): Admission: RE | Admit: 2009-12-13 | Discharge: 2009-12-13 | Payer: Self-pay | Admitting: Psychiatry

## 2009-12-16 ENCOUNTER — Encounter (INDEPENDENT_AMBULATORY_CARE_PROVIDER_SITE_OTHER): Payer: BC Managed Care – PPO | Admitting: Rehabilitative and Restorative Service Providers"

## 2009-12-18 ENCOUNTER — Ambulatory Visit (INDEPENDENT_AMBULATORY_CARE_PROVIDER_SITE_OTHER): Payer: BC Managed Care – PPO

## 2009-12-18 VITALS — BP 148/96 | HR 92 | Temp 97.2°F | Wt 296.0 lb

## 2009-12-25 ENCOUNTER — Ambulatory Visit (INDEPENDENT_AMBULATORY_CARE_PROVIDER_SITE_OTHER): Payer: Self-pay

## 2009-12-25 NOTE — Telephone Encounter (Signed)
Message copied by Wayland Salinas on Wed Dec 25, 2009  9:16 AM  ------       Message from: [place], [place]       Created: Wed Dec 25, 2009  9:14 AM         >> Jerrye Bushy [facility] 12/25/2009 09:14 AM       [facility]               Pt called. He is still having sleeping problems. It is effecting his job.        Please call him.        Thanks.

## 2009-12-30 ENCOUNTER — Encounter (INDEPENDENT_AMBULATORY_CARE_PROVIDER_SITE_OTHER): Payer: BC Managed Care – PPO | Admitting: Family Medicine
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
## 2009-12-30 NOTE — Telephone Encounter (Signed)
Pt calling that he has faxed the paperwork in; when completed pt states he needs a copy of this also.  Asked that it be faxed to him at : [phone].

## 2009-12-30 NOTE — Telephone Encounter (Signed)
Forms to Dr's mail box.

## 2010-01-01 NOTE — Telephone Encounter (Signed)
Pt calling that his work will need the leave of absence paperwork back by 11.28.11.

## 2010-01-04 ENCOUNTER — Telehealth (INDEPENDENT_AMBULATORY_CARE_PROVIDER_SITE_OTHER): Payer: Self-pay | Admitting: Family Medicine

## 2010-01-04 ENCOUNTER — Encounter (INDEPENDENT_AMBULATORY_CARE_PROVIDER_SITE_OTHER): Payer: Self-pay | Admitting: Family Medicine

## 2010-01-04 NOTE — Telephone Encounter (Signed)
I received forms for FMLA for short term disability, they were filled out and placed in the bin to fax last night.

## 2010-01-06 ENCOUNTER — Telehealth (INDEPENDENT_AMBULATORY_CARE_PROVIDER_SITE_OTHER): Payer: Self-pay | Admitting: Family Medicine

## 2010-01-06 NOTE — Telephone Encounter (Signed)
Completed paperwork faxed to the requested #'s.  Mailed to pt; copy to chart.

## 2010-01-06 NOTE — Telephone Encounter (Addendum)
Form faxed again.

## 2010-01-06 NOTE — Telephone Encounter (Addendum)
Pt calling back.  He received the fax.  Pt states that he worked until 11.12.11 so needs the paper to have from 11.13.11 and would need this re-faxed.

## 2010-01-07 ENCOUNTER — Encounter (INDEPENDENT_AMBULATORY_CARE_PROVIDER_SITE_OTHER): Payer: BC Managed Care – PPO | Admitting: Family Medicine

## 2010-01-07 NOTE — Telephone Encounter (Signed)
[employer] form needs completed.  Per Dr Angelia Mould, needs to see pt on 11.23.11 at noon.  Called pt; he can come in.    Pt scheduled as a special with Dr Angelia Mould.

## 2010-01-08 ENCOUNTER — Encounter (INDEPENDENT_AMBULATORY_CARE_PROVIDER_SITE_OTHER): Payer: Self-pay | Admitting: Family Medicine

## 2010-01-08 ENCOUNTER — Ambulatory Visit (INDEPENDENT_AMBULATORY_CARE_PROVIDER_SITE_OTHER): Payer: BC Managed Care – PPO | Admitting: Family Medicine
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
## 2010-01-08 NOTE — Progress Notes (Signed)
Short term disability forms faxed to [employer] at [place], [place]

## 2010-01-13 ENCOUNTER — Ambulatory Visit
Admission: RE | Admit: 2010-01-13 | Discharge: 2010-01-13 | Disposition: A | Discharge status: Home / Self Care | Payer: BC Managed Care – PPO | Source: Ambulatory Visit | Attending: Family Medicine | Admitting: Family Medicine

## 2010-01-13 DIAGNOSIS — N400 Enlarged prostate without lower urinary tract symptoms: Secondary | ICD-10-CM | POA: Insufficient documentation

## 2010-01-13 DIAGNOSIS — E785 Hyperlipidemia, unspecified: Secondary | ICD-10-CM | POA: Insufficient documentation

## 2010-01-13 DIAGNOSIS — I10 Essential (primary) hypertension: Secondary | ICD-10-CM | POA: Insufficient documentation

## 2010-01-13 LAB — CREATININE WITH EGFR
CREATININE: 0.89 mg/dL (ref 0.62–1.27)
ESTIMATED GLOMERULAR FILTRATION RATE: >59 ml/min/1.73m2 (ref >59)

## 2010-01-13 LAB — GLUCOSE FASTING: GLUCOSE, FASTING: 97 mg/dL (ref 70–105)

## 2010-01-13 LAB — ELECTROLYTES
ANION GAP: 9 mmol/L (ref 5–16)
CARBON DIOXIDE: 26 mmol/L (ref 23–33)
CHLORIDE: 104 mmol/L (ref 96–111)
POTASSIUM: 4.1 mmol/L (ref 3.5–5.1)
SODIUM: 139 mmol/L (ref 136–145)

## 2010-01-13 LAB — ALT (SGPT): ALT (SGPT): 49 U/L (ref 7–55)

## 2010-01-13 LAB — LIPID PANEL
CHOLESTEROL: 172 mg/dL (ref <200)
HDL-CHOLESTEROL: 43 mg/dL (ref >39)
LDL (CALCULATED): 80 mg/dL (ref <100)
NON - HDL (CALCULATED): 129 mg/dL (ref <190)
TRIGLYCERIDES: 247 mg/dL — ABNORMAL HIGH (ref <150)
VLDL (CALCULATED): 49 mg/dL — ABNORMAL HIGH (ref <30)

## 2010-01-13 LAB — BUN
BUN/CREAT RATIO: 12 (ref 6–22)
BUN: 11 mg/dL (ref 8–26)

## 2010-01-13 LAB — AST (SGOT): AST (SGOT): 30 U/L (ref 8–48)

## 2010-01-13 LAB — HGA1C (HEMOGLOBIN A1C WITH EST AVG GLUCOSE)
ESTIMATED AVERAGE GLUCOSE: 108 mg/dL
HEMOGLOBIN A1C: 5.4 % (ref 4.0–6.0)

## 2010-01-13 LAB — PSA SCREENING: PROSTATE SPECIFIC AG: 2.2 ng/mL (ref 0.0–4.0)

## 2010-01-13 LAB — CREATINE KINASE (CK), TOTAL, SERUM OR PLASMA: CREATINE KINASE (CK): 104 U/L (ref 48–222)

## 2010-01-14 NOTE — Telephone Encounter (Addendum)
Pt requesting a copy of the form be faxed to him at [phone].  Form faxed to pt.

## 2010-01-15 ENCOUNTER — Encounter (INDEPENDENT_AMBULATORY_CARE_PROVIDER_SITE_OTHER): Payer: BC Managed Care – PPO | Admitting: Family Medicine

## 2010-01-16 DIAGNOSIS — G4730 Sleep apnea, unspecified: Secondary | ICD-10-CM

## 2010-01-16 HISTORY — DX: Sleep apnea, unspecified: G47.30

## 2010-01-23 ENCOUNTER — Ambulatory Visit: Payer: BC Managed Care – PPO | Attending: Family Medicine

## 2010-01-23 DIAGNOSIS — G4730 Sleep apnea, unspecified: Secondary | ICD-10-CM | POA: Insufficient documentation

## 2010-01-27 ENCOUNTER — Other Ambulatory Visit (INDEPENDENT_AMBULATORY_CARE_PROVIDER_SITE_OTHER): Payer: Self-pay | Admitting: Family Medicine

## 2010-01-28 MED ORDER — METOPROLOL SUCCINATE ER 25 MG TABLET,EXTENDED RELEASE 24 HR
25.0000 mg | ORAL_TABLET | Freq: Every day | ORAL | Status: DC
Start: 2010-01-27 — End: 2010-12-29

## 2010-01-29 ENCOUNTER — Other Ambulatory Visit (INDEPENDENT_AMBULATORY_CARE_PROVIDER_SITE_OTHER): Payer: Self-pay | Admitting: Family Medicine

## 2010-01-29 ENCOUNTER — Encounter (INDEPENDENT_AMBULATORY_CARE_PROVIDER_SITE_OTHER): Payer: BC Managed Care – PPO | Admitting: Family Medicine

## 2010-01-30 ENCOUNTER — Encounter (INDEPENDENT_AMBULATORY_CARE_PROVIDER_SITE_OTHER): Payer: Self-pay | Admitting: Family Medicine

## 2010-01-30 ENCOUNTER — Ambulatory Visit (INDEPENDENT_AMBULATORY_CARE_PROVIDER_SITE_OTHER): Payer: BC Managed Care – PPO | Admitting: Family Medicine

## 2010-01-30 VITALS — BP 126/82 | HR 78 | Temp 97.2°F | Ht 69.0 in | Wt 297.0 lb

## 2010-01-30 MED ORDER — DICYCLOMINE 20 MG TABLET
20.0000 mg | ORAL_TABLET | Freq: Four times a day (QID) | ORAL | Status: DC
Start: 2010-01-29 — End: 2012-01-26

## 2010-01-30 NOTE — Progress Notes (Signed)
Subjective:     Patient ID:  Henry [name] is an 54 y.o. male   Chief Complaint:  Chief Complaint   Patient presents with   . Physical         HPI Comments: 54 year old gentleman here for physical examination following up for chronic medical conditions which include hyper cholesterolemia, hypertension and benign prostatic hypertrophy.  He had a normal colonoscopy in 2008.  His flu vaccine is up to date.    Current outpatient prescriptions:  Omeprazole 20 mg Oral Tablet, Delayed Release (E.C.), take  by mouth.  Diclofenac Sodium (VOLTAREN) 75 mg Oral Tablet, Delayed Release (E.C.), take 1 Tab by mouth Twice daily.  metoprolol succinate (TOPROL XL) 25 mg Oral Tablet Sustained Release 24 hr, take 1 Tab by mouth Once a day.  Dicyclomine (BENTYL) 20 mg Oral Tablet, take 1 Tab by mouth Four times a day.  atorvastatin (LIPITOR) 20 mg Tab, take 1 Tab by mouth QPM.   ASPIR-81 81 mg TbEC, qd  M-VIT ORAL, qd  Dicyclomine (BENTYL) 20 mg Oral Tablet, take 1 Tab by mouth Four times a day.  metoprolol succinate (TOPROL XL) 25 mg Oral Tablet Sustained Release 24 hr, take 1 Tab by mouth Once a day.  Chondroitin Sulfate A 250 mg Cap, Twice daily.   ZANTAC 150 mg Tab, qd  GLUCOS CHOND CPLX ADVANCED ORAL, qd      ALLERGIES:   -- Sulfa (Sulfonamide Antibiotics) -- Rash    Social History    Marital Status: Married             Spouse Name:                       Years of Education:                 Number of children:               Occupational History  Occupation          Fish farm manager            Comment                                   [employer]            Social History Main Topics    Smoking Status: Never Smoker                      Comment: occ. cigar    Alcohol Use: Yes                Comment: occ.    Drug Use: No            Other Topics            Concern  Abuse/Domestic Violence No  Caffeine Concern        No  Calcium intake adequate Yes  Computer Use            Yes  Drives                  Yes  Exercise Concern         No  Seat Belt               Yes  Special Diet            No  Sunscreen used  Yes  Right hand dominant     Yes      Review of patient's family history indicates:    Diabetes                       Mother                    Cancer                         Mother                    High Cholesterol               Mother                    Hypertension                   Mother                    Coronary Artery Disease        Mother                    Migraines                      Mother                    Cancer                         Father                    Asthma                         Father                    COPD                           Father                    Past Medical History:    HTN                                                           HYPERLIPIDEMIA                                                    Review of Systems   Constitutional: Negative.    HENT: Negative.    Eyes: Negative.    Respiratory: Negative.    Cardiovascular: Negative.    Gastrointestinal: Positive for heartburn. Negative for nausea, vomiting, abdominal pain, diarrhea, constipation, blood in stool and melena.   Genitourinary: Negative.    Musculoskeletal: Negative.    Skin: Negative.    Neurological: Negative.    Psychiatric/Behavioral: Negative for depression. The patient is not nervous/anxious and does not have insomnia.  Objective:   Physical Exam   Constitutional: He is oriented to person, place, and time. He appears well-developed and well-nourished.        BP 142/100  Pulse 78  Temp 36.2 C (97.2 F)  Ht 1.753 m (5' 9" )  Wt 134.718 kg (297 lb)  BMI 43.86 kg/m2     HENT:        Normocephalic/atraumatic. Pupils equal round reactive to light and accommodation. TMs clear bilaterally. Extraocular movement intact. Neck is supple no thyromegaly no carotid bruits. Posterior pharynx is clear mucous membranes are moist. Funduscopic exam reveals normal vasculature and flat discs.       Cardiovascular: Normal rate and  regular rhythm.  Exam reveals no gallop and no friction rub.    No murmur heard.  Pulm:  Breath sounds normal.  Observations:  no respiratory distress.    There are no rales present.    There are no wheezes present.  The chest exhibits no tenderness.    Abdomen:   Bowel sounds are normal. He exhibits no mass. No tenderness. He has no guarding.   GU:      Penile exam:  Penis normal to exam.      Prostate Exam:  Prostate normal to palpation.      Rectal Exam:   Rectum normal to exam.   Guaiac negative stool.    Ortho/Musculoskeletal:   He exhibits no edema.   Neurological: He is alert and oriented to person, place, and time. He has normal reflexes.   Skin: Skin is warm and dry.   Psychiatric: He has a normal mood and affect.     .    Assessment & Plan:     401.9 Hypertension  (primary encounter diagnosis)  Comment: Blood pressure well controlled  Plan: Continue current medication    V06.1V Need for DTaP vaccination  Comment: Patient's chart reviewed tetanus updated  Plan: DIPTHERIA/PERTUSSIS/TETANUS, AD(ADMIN)  Injection given without problems or complication    438.00AE Benign Prostatic Hypertropy  Comment: Patient is without significant complaints or symptoms  Plan: We discussed prostate-specific antigen testing his prostate-specific antigen at 2.2 will be followed in a year or 2.    272.0 Hypercholesterolemia  Comment:   Lab Results   Component Value Date    CHOLESTEROL 172 01/13/2010    HDLCHOL 43 01/13/2010    LDLCHOL 80 01/13/2010    TRIG 247* 01/13/2010          Plan: LDL below 100 HDL above 40 continue current care and medication.

## 2010-02-05 ENCOUNTER — Telehealth (INDEPENDENT_AMBULATORY_CARE_PROVIDER_SITE_OTHER): Payer: Self-pay | Admitting: Family Medicine

## 2010-02-05 NOTE — Telephone Encounter (Signed)
Pt calling to ask if Dr has received the results form the Sleep Study done on 12.8.11.  [phone] (home) [phone] (work)     Telephone Information:   Mobile [phone]       Results are in [place].

## 2010-02-06 NOTE — Telephone Encounter (Signed)
I reviewed the results of the sleep study revealing obstructive sleep apnea sleep lab indicates they will contact the patient for trial of CPAP I informed the patient and I asked that he discuss his continued off work status with his employer as this may take one month to 6 weeks to get his study done and his titration to the point he doesn't have symptoms of tiredness and fatigue.  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  Patient will contact me and let me know where we need to go from here.

## 2010-02-11 ENCOUNTER — Telehealth (INDEPENDENT_AMBULATORY_CARE_PROVIDER_SITE_OTHER): Payer: Self-pay | Admitting: Family Medicine

## 2010-02-11 NOTE — Telephone Encounter (Signed)
Pt calling that he is faxing a Fitness for Duty form for Dr to fill out for pt to go back to work on 1.2.12.  Fax the completed form to the pt at:  [phone].    [phone] (home) [phone] (work)

## 2010-02-14 ENCOUNTER — Other Ambulatory Visit (INDEPENDENT_AMBULATORY_CARE_PROVIDER_SITE_OTHER): Payer: Self-pay | Admitting: Family Medicine

## 2010-02-18 ENCOUNTER — Encounter (INDEPENDENT_AMBULATORY_CARE_PROVIDER_SITE_OTHER): Payer: Self-pay | Admitting: Family Medicine

## 2010-02-18 NOTE — Telephone Encounter (Signed)
Pt called that the earliest he can get a Sleep Study scheduled is 1.31.12.  States that Dr will be receiving a fax to extend the disability dates unless his appt can be moved up sooner, will need to have the leave extended to at least 2.14.12 so the results can be received.  [phone] (home) [phone] (work)    Telephone Information:   Mobile [phone]

## 2010-02-18 NOTE — Progress Notes (Signed)
Info faxed to [phone].  [place]

## 2010-02-19 NOTE — Telephone Encounter (Signed)
Pt calling that he sent a form over for Dr to sign today.  States Dr should also have a [employer] form that needs to be completed.  Please call pt with any questions.  [phone] (home) [phone] (work)    Telephone Information:   Mobile [phone]

## 2010-02-20 ENCOUNTER — Encounter (INDEPENDENT_AMBULATORY_CARE_PROVIDER_SITE_OTHER): Payer: Self-pay | Admitting: Family Medicine

## 2010-02-24 NOTE — Telephone Encounter (Signed)
FMLA form faxed to pt and his work.

## 2010-02-27 ENCOUNTER — Encounter (HOSPITAL_BASED_OUTPATIENT_CLINIC_OR_DEPARTMENT_OTHER): Payer: Self-pay

## 2010-03-03 ENCOUNTER — Ambulatory Visit: Payer: BC Managed Care – PPO | Attending: Family Medicine

## 2010-03-10 ENCOUNTER — Encounter: Payer: Self-pay | Admitting: Family Medicine

## 2010-03-17 ENCOUNTER — Encounter (HOSPITAL_BASED_OUTPATIENT_CLINIC_OR_DEPARTMENT_OTHER): Payer: Self-pay | Admitting: Neurology

## 2010-03-17 NOTE — Progress Notes (Signed)
Called pt to give results of CPAP study done on 03/03/10.  Pt seemed to understand results and would like to use [facility] as his DME.  Pt stated that he is looking forward to receiving his machine so that he can get a good night's sleep.  He also stated that he has been off work due to falling asleep.  He questioned when he would be able to go back to work.  I told him that he would need to follow up with his Dr for that.  I was unable to make that decision.  L Ravinder Hofland, RPSGT

## 2010-03-18 ENCOUNTER — Encounter (HOSPITAL_BASED_OUTPATIENT_CLINIC_OR_DEPARTMENT_OTHER): Payer: Self-pay

## 2010-03-19 ENCOUNTER — Telehealth (INDEPENDENT_AMBULATORY_CARE_PROVIDER_SITE_OTHER): Payer: Self-pay | Admitting: Family Medicine

## 2010-03-19 NOTE — Telephone Encounter (Signed)
Pt states that a [employer] form was faxed to Dr Angelia Mould yesterday, 1.31.12.  Please fill out and give to me to fax.  Also, has Dr seen the sleep study results.    Also, pt would like a copy of this.  Pt's fax # [phone].  Seleta Rhymes, RN

## 2010-03-19 NOTE — Telephone Encounter (Signed)
Message copied by Seleta Rhymes on Wed Mar 19, 2010  7:50 AM  ------       Message from: Barnabas Harries       Created: Tue Mar 18, 2010  3:49 PM       Regarding: work leave; sleep study results         Pt. states he has a short-term disability and a work leave that needs extended to 04/02/10 by Dr. Angelia Mould.  He also would like sleep study results.

## 2010-03-20 ENCOUNTER — Encounter (INDEPENDENT_AMBULATORY_CARE_PROVIDER_SITE_OTHER): Payer: Self-pay | Admitting: Family Medicine

## 2010-03-20 NOTE — Progress Notes (Signed)
[employer] papers faxed  Copy to chart, original to patient

## 2010-03-26 ENCOUNTER — Telehealth (INDEPENDENT_AMBULATORY_CARE_PROVIDER_SITE_OTHER): Payer: Self-pay | Admitting: Family Medicine

## 2010-03-26 NOTE — Telephone Encounter (Signed)
Completed form faxed to # provided.  Mailed to pt. Pt has started C-PAP and has found it very helpful.  Pt ready to go back to work.  States the machine has been saying he has been getting 6 hours of sleep a night.

## 2010-03-26 NOTE — Telephone Encounter (Signed)
Pt calling that he needs a fitness for return to duty certification filled out by Dr.  Marcelline Deist problem is that he has two different dates for return to work, on the [place] form the date for return is 2.20.12 but on the leave of absence it is 2.15.12.  Needs Dr to decide which date and put that on the form.  [phone] (home) [phone] (work)  Called pt; he is feeling pretty good and would like Dr to put the 2.15.12 date on the form.  Pt faxed the form earlier today.  Fax the form to [phone].

## 2010-04-09 ENCOUNTER — Encounter (HOSPITAL_BASED_OUTPATIENT_CLINIC_OR_DEPARTMENT_OTHER): Payer: Self-pay

## 2010-05-04 LAB — CBC
Hemoglobin: 10.7 g/dL — ABNORMAL LOW (ref 13.0–17.0)
MCH: 28.8 pg (ref 26.0–34.0)
MCH: 28.9 pg (ref 26.0–34.0)
MCHC: 33.4 g/dL (ref 30.0–36.0)
MCHC: 33.5 g/dL (ref 30.0–36.0)
MCV: 86.1 fL (ref 78.0–100.0)
MCV: 86.6 fL (ref 78.0–100.0)
Platelets: 222 10*3/uL (ref 150–400)
RDW: 15.1 % (ref 11.5–15.5)
RDW: 15.1 % (ref 11.5–15.5)
WBC: 6.2 10*3/uL (ref 4.0–10.5)
WBC: 7.8 10*3/uL (ref 4.0–10.5)

## 2010-05-04 LAB — COMPREHENSIVE METABOLIC PANEL
Alkaline Phosphatase: 29 U/L — ABNORMAL LOW (ref 39–117)
Calcium: 9.9 mg/dL (ref 8.4–10.5)
GFR calc non Af Amer: >60 mL/min (ref >60)
Glucose, Bld: 80 mg/dL (ref 70–99)
Sodium: 143 mEq/L (ref 135–145)

## 2010-05-04 LAB — DIFFERENTIAL
Basophils Absolute: 0 10*3/uL (ref 0.0–0.1)
Basophils Absolute: 0 10*3/uL (ref 0.0–0.1)
Basophils Relative: 0 % (ref 0–1)
Eosinophils Absolute: 0 10*3/uL (ref 0.0–0.7)
Eosinophils Relative: 0 % (ref 0–5)
Eosinophils Relative: 1 % (ref 0–5)
Lymphs Abs: 2.2 10*3/uL (ref 0.7–4.0)
Monocytes Absolute: 0.8 10*3/uL (ref 0.1–1.0)
Monocytes Relative: 13 % — ABNORMAL HIGH (ref 3–12)
Neutrophils Relative %: 50 % (ref 43–77)

## 2010-05-04 LAB — URINALYSIS, ROUTINE W REFLEX MICROSCOPIC
Bilirubin Urine: NEGATIVE
Hgb urine dipstick: NEGATIVE
Protein, ur: NEGATIVE mg/dL
Urobilinogen, UA: 0.2 mg/dL (ref 0.0–1.0)

## 2010-05-04 LAB — BASIC METABOLIC PANEL
BUN: 14 mg/dL (ref 6–23)
Calcium: 9.4 mg/dL (ref 8.4–10.5)
Creatinine, Ser: 0.88 mg/dL (ref 0.4–1.5)
GFR calc Af Amer: >60 mL/min (ref >60)
GFR calc non Af Amer: >60 mL/min (ref >60)
Glucose, Bld: 85 mg/dL (ref 70–99)

## 2010-05-04 LAB — ETHANOL: Alcohol, Ethyl (B): <5 mg/dL (ref 0–10)

## 2010-05-04 LAB — POCT CARDIAC MARKERS
CKMB, poc: 2.9 ng/mL (ref 1.0–8.0)
Myoglobin, poc: 266 ng/mL (ref 12–200)

## 2010-05-04 LAB — RAPID URINE DRUG SCREEN, HOSP PERFORMED
Amphetamines: NOT DETECTED
Amphetamines: NOT DETECTED
Barbiturates: NOT DETECTED
Benzodiazepines: POSITIVE — AB
Cocaine: NOT DETECTED
Opiates: POSITIVE — AB

## 2010-05-04 LAB — HEPATITIS B SURFACE ANTIGEN: Hepatitis B Surface Ag: NEGATIVE

## 2010-05-05 ENCOUNTER — Encounter (HOSPITAL_BASED_OUTPATIENT_CLINIC_OR_DEPARTMENT_OTHER): Payer: Self-pay | Admitting: Neurology

## 2010-05-05 NOTE — Progress Notes (Signed)
I received a compliance report on Henry [name] that was from 03/19/10 to 04/25/10 that shows he is 100% compliant.  His daily average is 5 hours and 52 minutess. His average AHI is 3.8.  He is not showing a problem with leaks. He used the machine for  96.7% of the time for more than 4 hours.  He is currently on CPAP of 7  With [employer] He states he feels better and has less daytime sleepiness.  Peggy Gunnoe,RPSGT

## 2010-05-22 ENCOUNTER — Encounter (HOSPITAL_BASED_OUTPATIENT_CLINIC_OR_DEPARTMENT_OTHER): Payer: BC Managed Care – PPO | Admitting: Neurology

## 2010-06-26 ENCOUNTER — Ambulatory Visit (HOSPITAL_BASED_OUTPATIENT_CLINIC_OR_DEPARTMENT_OTHER): Payer: BC Managed Care – PPO | Admitting: Neurology

## 2010-06-26 VITALS — BP 148/98 | HR 79 | Temp 98.3°F | Wt 295.2 lb

## 2010-06-26 NOTE — Progress Notes (Signed)
Name of Patient: Henry [name]  Date of Birth: [birth year]  Date of Service: 06/26/2010    CHIEF COMPLAINT:  Obstructive sleep apnea     HPI:   We had the pleasure of seeing Henry [name] in the Sleep Disorders Clinic at the [facility] for evaluation of his obstructive sleep apnea. He is currently on CPAP at a setting of 7 cm.    The patient reports that since starting CPAP he is doing excellent.  He reports using his CPAP 7 nights per week with a assessed nasal interface which he wears for approximately 6-8 hours per night.  The interface does fit well, he has not noted facial irritation, and he does not snore when using his machine.      The patient reports he feels CPAP has been helpful and he is less sleepy during the day.      PAST MEDICAL HISTORY:    Patient Active Problem List   Diagnoses Code   . Benign Prostatic Hypertropy 600.00AE   . Hyperlipidemia 272.4   . BPH 600.00N   . Hypertension 401.9   . Irritable bowel syndrome 564.1   . Hypercholesterolemia 272.0   . 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 Tear, meniscus 836.2K   . 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 Normal colonoscopy 2008 V76.51E          Surgeries:   Status post appendectomy and surgery for broken ankle      Outpatient prescriptions prior to encounter:  Dicyclomine (BENTYL) 20 mg Oral Tablet take 1 Tab by mouth Four times a day.   metoprolol succinate (TOPROL XL) 25 mg Oral Tablet Sustained Release 24 hr take 1 Tab by mouth Once a day.   Omeprazole 20 mg Oral Tablet, Delayed Release (E.C.) take  by mouth.   Diclofenac Sodium (VOLTAREN) 75 mg Oral Tablet, Delayed Release (E.C.) take 1 Tab by mouth Twice daily.   metoprolol succinate (TOPROL XL) 25 mg Oral Tablet Sustained Release 24 hr take 1 Tab by mouth Once a day.   Dicyclomine (BENTYL) 20 mg Oral Tablet take 1 Tab by mouth Four times a day.   atorvastatin (LIPITOR) 20 mg Tab take 1 Tab by mouth QPM.     Chondroitin Sulfate A 250 mg Cap Twice daily.    ASPIR-81 81 mg TbEC qd   ZANTAC 150 mg Tab qd   GLUCOS CHOND CPLX ADVANCED ORAL qd   M-VIT ORAL qd           FAMILY HISTORY:  No family history of sleep apnea. Family history positive for diabetes and cancer.    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 He is married with no children.  Patient does not smoke and reports social alcohol use.    REVIEW OF SYMPTOMS  In addition, the patient filled out a 13 point review of systems questionnaire in which all other systems were negative other then noted above.    PHYSICAL EXAM:   Blood pressure 148/98, pulse 79, temperature 36.8 C (98.3 F), weight 133.9 kg (295 lb 3.1 oz), SpO2 96.00%.   Gen: Patient is casually dressed and well-groomed. Pleasant and cooperative with examination. No psychomotor agitation.  Eyes: No abnormalities. No scleral injection.  ENT: Unremarkable The patient has a Mallampati class 3 airway.  Neck: No thyromegaly Neck size: 18 in.  Lymph: No adenopathy  Respiratory: Clear to  auscultation  CV: Regular rate and rhythm  Abdomen: No tenderness  Ext: No significant edema  Skin: No irritation on the face at the site of the CPAP mask  Neuro: Alert and oriented x4. No focal motor weakness. Gait normal.    LABORATORY DATA:   An Epworth Sleepiness Scale was completed by the patient in clinic today and the sum total score was 12.     IMPRESSION/RECOMMENDATIONS:   Obstructive sleep apnea, sleepiness is well controlled on CPAP.  Continue pressure of 7 cm We reminded him to replace his interface at least every 6 months to insure continued proper interface fit and seal.  We asked him to return to Sleep clinic in 6 months for follow up.       Mattalynn Crandle A. Annamaria Boots, MD

## 2010-07-04 NOTE — Discharge Summary (Signed)
NAME:  Martin Singleton, Martin Singleton                          ACCOUNT NO.:  [id number]   MEDICAL RECORD NO.:  [id number]                   PATIENT TYPE:  INP   LOCATION:  4709                                 FACILITY:  [facility]   PHYSICIAN:  Ermalene Searing. Leander Rams, M.D.                DATE OF BIRTH:  August 31, 1955   DATE OF ADMISSION:  04/19/2002  DATE OF DISCHARGE:  04/23/2002                                 DISCHARGE SUMMARY   DISCHARGE DIAGNOSES:  1. Abdominal pain secondary to partial proximal small bowel obstruction,     resolved.  2. Partial proximal small bowel obstruction, resolved.  3. Dehydration, resolved.   DISCHARGE MEDICATIONS:  1. Duragesic patch 75 mcg, change every three days.  2. Protonix 40 mg a day.  3. Effexor 37.5 mg p.o. daily x 7 days and then 75 mg p.o. daily.   ALLERGIES:  CODEINE.   PROCEDURES:  None.   HISTORY OF PRESENT ILLNESS:  This is a 55 year old white male with a long  history of abdominal pain and chronic pain syndrome on a significant number  of medications for his pain syndrome, including fentanyl, Neurontin, and  Celebrex.  The patient also has a known history of depression.  He presented  to the emergency department with a two-day history of emesis followed by  diffuse lower abdominal pain, and fevers on and off.  The emesis is  nonbloody.  No coffee grounds were noted.  The patient reports that the  emesis is yellow or yellow-green and profuse watery diarrhea which is  nonbloody.  No recent travel or no ill contacts.  The patient had problems  similar to this in September of 2003 and November of 2003 for which he was  seen at [facility]. [facility].  He denies other GI symptoms, any  GU symptoms, and problems with his extremities, skin, lymph system,  neurologic system, and endocrine system.  No change in weight.  The patient  was admitted for further evaluation and work-up.   HOSPITAL COURSE:  The patient was admitted to a telemetry bed.  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  CT  scan of the abdomen and pelvis reveals decompressed small bowel loops, no  evidence of bowel obstruction, small cyst noted in the caudate lobe of the  liver, and otherwise unremarkable.  No free fluid, free air, or adenopathy.  CT of the pelvis revealed normal appendix, no free fluid, no free air, no  adenopathy, and bony structure unremarkable.  The patient's abdomen was  decompressed with an NG tube.  The patient was placed NPO.  His symptoms did  resolve over the course of approximately 48 hours, at which point his diet  was advanced and he was able to tolerate the same.  The patient was  discontinued off of all of his medications, except for his fentanyl during  his stay  with the thought that his medication may be significantly  contributing to numerous episodes of bowel obstruction and/or ileus.  He was  also maintained on his Protonix while in the hospital.   The patient's wife had a private conference with the care manager away from  the patient, during which she expressed significant concern about the  patient's mental health in terms of his depression worsening, increasing  depressive symptoms, including inability to sleep, suicidal ideation with a  plan, and increasing narcotic use.  Due to her concerns, the patient did  receive a psychiatric evaluation from Geoffery Lyons, M.D., who recommended  that the patient be discharged on minimal narcotic pain medication, that his  antidepressants be changed to Effexor as noted above, and that he continue  to see his psychiatrist, Andee Poles, M.D.  The patient has an  appointment with Dr. Nolen Mu on May 02, 2002, but Dr. Dub Mikes encouraged him  to seek an appointment with her sooner.   Because the patient was febrile and did have an elevated white blood cell  count on presentation, a surgical consult was obtained from [facility].  Derrell Lolling, M.D., of general  surgery.  The patient was found not to have a  surgical abdomen.  In reviewing the patient's records during his surgical  consults, it was noted that in December of 2003 the patient had been  evaluated with an EGD and an upper GI with small bowel follow through, none  of which had findings significant for the type of pain he is currently  experiencing.  Therefore, it was suggested that the patient may be suffering  from recurrent ileus due to narcotic use.  The patient does have a follow-up  appointment with Danise Edge, M.D., for any other potential tests, such  as encapsulated camera study.  At the time of discharge, the patient's vital  signs are temperature 98.4 degrees, blood pressure 122/74, pulse 68,  respirations 13, and room air saturations 95%.  His white blood cell count  is 8.4.  He is free of abdominal pain, nausea, and vomiting and has been  able to tolerate a regular diet for greater than 24 hours.   DISCHARGE LABORATORY DATA:  Sodium 139, potassium 3.2, glucose 110, BUN 14,  creatinine 0.7.  White blood cell count 8.4, hemoglobin 13.1, hematocrit  37.5, platelet count 224,000.   CONSULTS:  1. Angelia Mould. Derrell Lolling, M.D., general surgery.  2. Geoffery Lyons, M.D., psychiatry.   CONDITION ON DISCHARGE:  Good.   DISPOSITION:  Discharged to home.   FOLLOW-UP:  Follow-up is to be with Danise Edge, M.D., in four weeks.  The patient is to see Molly Maduro L. Foy Guadalajara, M.D., in the next seven days.  He is  to follow up with Andee Poles, M.D., on May 02, 2002, or sooner if  possible regarding his depressive symptoms.     Ellender Hose. Sharol Given. Leander Rams, M.D.    SMD/MEDQ  D:  04/23/2002  T:  04/23/2002  Job:  [id number]   cc:   Danise Edge, M.D.  [address]  [address]  [place]  [place] [address]  Fax: [phone]   Angelia Mould. Derrell Lolling, M.D.  [address], [address]  [place]  [place] [address] Fax: [phone]   Doris Cheadle. Foy Guadalajara, M.D.  [address] [place]  [place] [address]  Fax: [phone]  Andee Poles, M.D.  [address], [address]  [place], [place] [address]  Fax: [phone]
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
## 2010-07-04 NOTE — Op Note (Signed)
   NAMEJAKORI, BURKETT                          ACCOUNT NO.:  [id number]   MEDICAL RECORD NO.:  [id number]                   PATIENT TYPE:  INP   LOCATION:  4736                                 FACILITY:  [facility]   PHYSICIAN:  Danise Edge, M.D.                DATE OF BIRTH:  [birth year]   DATE OF PROCEDURE:  01/12/2002  DATE OF DISCHARGE:                                 OPERATIVE REPORT   PROCEDURE:  Esophagogastroduodenoscopy.   INDICATIONS FOR PROCEDURE:  Mr. Namari Breton is a 55 year old male born  Jun 19, 1955. Mr. Zahner was admitted with unexplained nausea and  vomiting.   An upper GI small bowel follow through x-ray series reveals thickening of  the duodenal and jejunal wall.   ENDOSCOPIST:  Danise Edge, M.D.   PREMEDICATION:  Versed 10 mg, fentanyl 100 mcg.   ENDOSCOPE:  Olympus pediatric colonoscope.   DESCRIPTION OF PROCEDURE:  After obtaining informed consent, Mr. Pafford was  placed in the left lateral decubitus position. I administered intravenous  Versed and intravenous fentanyl to achieve conscious sedation for the  procedure. The patient's blood pressure, oxygen saturation and cardiac  rhythm were monitored throughout the procedure and documented in the medical  record.   The Olympus pediatric colonoscope was passed through the posterior  hypopharynx into the proximal esophagus without difficulty. The hypopharynx  and larynx appeared normal. I did not visualize the vocal cords.   ESOPHAGOSCOPY:  The proximal and mid segments of the esophagus appeared  normal.  There is erosive appearing mucosa at the esophagogastric junction  which could be related to his persistent nausea and vomiting. Four quadrant  biopsies were taken at the esophagogastric junction.   GASTROSCOPY:  There is a moderate sized hiatal hernia. Retroflexed view of  the gastric cardia and fundus was normal. The gastric body, antrum and  pylorus appeared normal.   DUODENOSCOPY:  The  duodenal bulb appeared normal. The mucosa is slightly  erythematous in the mid to distal duodenum and proximal jejunum without  ulceration or nodularity. Multiple biopsies were taken from the small bowel.   ASSESSMENT:  1. Erosive appearing mucosa at the esophagogastric junction biopsied.  2. Hiatal hernia.  3. Small bowel biopsies performed.                                              Danise Edge, M.D.   MJ/MEDQ  D:  01/17/2002  T:  01/17/2002  Job:  [id number]

## 2010-07-04 NOTE — H&P (Signed)
[address]. [facility]  Patient:    Martin Singleton, Martin Singleton Visit Number: [id number] MRN: [id number]          Service Type: SUR Location: 3000 [id number] Attending Physician:  Emeterio Reeve Dictated by:   Payton Doughty, M.D. 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  SERVICE:  Neurosurgery.  HISTORY OF PRESENT ILLNESS:  This is a 55 year old right-handed white gentleman who had an anterior cervical diskectomy and fusion done in 1995, had a visit with Dr. 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  He had some right arm pain.  He has constant right arm pain now and MRI shows a new herniated disk at C4-5.  He is admitted now for exploration of his nonunion, anterior cervical diskectomy and fusion at C4-5 and plating across the whole affair.  PAST MEDICAL HISTORY:  Medical history is otherwise benign.  He has had a knee operation in the past and has headaches.  CURRENT MEDICATIONS: 1. Celebrex 200 mg twice a day. 2. Celexa 60 mg twice a day. 3. Remeron 30 mg twice a day. 4. Neurontin 1200 mg twice a day. 5. Percocet 7.5 mg three times a day. 6. Imitrex on a p.r.n. basis.  ALLERGIES:  He gets headaches with CODEINE and leg cramps with SANSERT.  SOCIAL HISTORY:  He smokes a pack and a half of cigarettes a day, he drinks alcohol socially and is on disability.  FAMILY HISTORY:  Mom is 56 and in good health with hypertension.  Dad is 87 and in good health.  REVIEW OF SYSTEMS:  Review of systems is remarkable for glasses, injuries to his eyes, hearing loss, tinnitus, hypercholesterolemia, incontinence, arm weakness, leg weakness, back pain, arm pain, leg pain, joint pain, neck pain, difficulty with coordination and depression.   PHYSICAL EXAMINATION:  HEENT:  Within normal limits.  NECK:  He has reasonable range of motion in the neck and carries  it stiffly in the anterior flexed position.  CHEST:  Diffuse crackles.  CARDIAC:  Regular rate and rhythm.  ABDOMEN:  Nontender with no hepatosplenomegaly.  EXTREMITIES:  Without clubbing or cyanosis.  GU:  Deferred.  PERIPHERAL PULSES:  Good.  NEUROLOGIC:  He is awake, alert and oriented.  His cranial nerves appear to be intact.  Motor exam shows 5/5 strength throughout the upper extremities with the exception of the right deltoid, which is 4/5; this weakness has developed over the year that I have known him.  Reflexes are 3 at the biceps, 2 at the triceps, and 2 at the brachioradialis.  He has a positive Hoffmanns bilaterally.  Knee jerks are 3, ankle jerks are 3 and there is no clonus.  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  PLAN:  The plan is for an anterior cervical diskectomy and fusion at C4-C5 and re-exploration of the 5-6 fusion.  The risks and benefits of this approach have been discussed with him and he wishes to proceed. Dictated by:   Payton Doughty, M.D. Attending Physician:  Emeterio Reeve DD:  06/21/01 TD:  06/22/01 Job: [id number]

## 2010-07-04 NOTE — Op Note (Signed)
[place]. [facility]  Patient:    Martin Singleton, Martin Singleton Visit Number: [id number] MRN: [id number]          Service Type: SUR Location: 3000 [id number] 01 Attending Physician:  Emeterio Reeve Dictated by:   Payton Doughty, M.D. Proc. 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  POSTOPERATIVE DIAGNOSIS:  Solid fusion at C5-6, herniated disk at C4-5.  PROCEDURE:  Exploration of C5-6 fusion and anterior cervical diskectomy and fusion at C5-C6 with a tethered plate.  SURGEON:  Payton Doughty, M.D.  ANESTHESIA:  General endotracheal.  ESTIMATED BLOOD LOSS:  COMPLICATIONS:  None.  ASSISTANT:  Nurse Basilia Jumbo.  ASSISTANT SURGEON:  Danae Orleans. Venetia Maxon, M.D.  INDICATIONS FOR PROCEDURE:  This is a 55 year old right-handed white gentleman who had an anterior cervical done some years ago at the C5-6 level. There is a question of nonunion.  He also has a herniated disk at C4-5 and is admitted now for an anterior cervical diskectomy and fusion, and exploration of the C5-6 fusion.  DESCRIPTION OF PROCEDURE:  He was taken to the operating room.  He was intubated and placed supine on the operating table in the Holter head traction, following shave, prep and drape in usual sterile fashion.  The skin was incised in the midline and the median border of the sternocleidomastoid on the left side.  The platysma was identified, elevated and undermined.  The sternocleidomastoid was then identified and dissection revealed the carotid artery retracted laterally to left; the trachea and esophagus were retracted laterally to the right -- exposing the bones of the anterior cervical spine. Marker was placed; intraoperative x-ray was obtained.  The marker was at C4-5.  Careful exploration down along the vertebral body below that and to the next vertebral body revealed a solid  fusion across the C5-6 interspace.  It was therefore elected to do only the C4-5 level.  Diskectomy was carried out under gross observation.  The operating microscope was then brought in, and microdissection technique used to dissect the anterior epidural space, remove the remaining disk and decompress the neural foramina.  Having completed this, a 7 mm bone graft fashioned from patellar allograft was tapped into place.  A 14 mm tether plate was then placed, with two 13 mm screws (two NC4 and two NC5).  Intraoperative x-ray showed good placement of bone graft, plate and screws.  The wound was irrigated.  Hemostasis assured.  The platysma was reapproximated with 3-0 Vicryl in interrupted fashion.  Subcutaneous tissue was reapproximated with 3-0 Vicryl in interrupted fashion.  The skin was closed with 4-0 Vicryl in a running subcuticular fashion.  Benzoin and Steri-Strips were placed, made occlusive with Telfa at op site.  The patient then returned to recovery room in good condition. Dictated by:   Payton Doughty, M.D. Attending Physician:  Emeterio Reeve DD:  06/21/01 TD:  06/22/01 Job: [id number]

## 2010-07-04 NOTE — Discharge Summary (Signed)
NAMEARNOL, MCGIBBON                          ACCOUNT NO.:  [id number]   MEDICAL RECORD NO.:  [id number]                   PATIENT TYPE:  INP   LOCATION:  4736                                 FACILITY:  [facility]   PHYSICIAN:  Sherin Quarry, MD                   DATE OF BIRTH:  [birth year]   DATE OF ADMISSION:  01/12/2002  DATE OF DISCHARGE:                                 DISCHARGE SUMMARY   Martin Singleton is a 55 year old man with a previous history of chronic pain  syndrome, who presented on 11/27 with a 24 hour history of persisting nausea  and vomiting.  He was initially admitted by Dr. Julio Sicks who reports on  his physical exam; HEENT exam was within normal limits.  Chest was clear.  Cardiovascular exam revealed normal S1 and S2, without rubs, murmurs or  gallops.  The abdomen was completely benign with normal bowel sounds,  without masses, tenderness or organomegaly.   Initial laboratory studies included an amylase of 22, the sodium was 140,  potassium 3.0, lipase 19, CBC was within normal limits.  A drug screen was  positive for opiates and also for THC.  Urinalysis was normal.   Initially, the patient was treated with IV fluids in the form of D5 in half  normal saline with 10 mEq of Kay Ciel per liter at 150 cc per hour.  He was  also given two boluses of 10 mEq of potassium.  The patient continued to  vomit, Reglan was added to his regimen.  Ultimately, consultation was  obtained with Dr. Danise Edge who first recommended a upper GI and small  bowel follow through.  This showed duodenitis with localized location and  well thickening involving the proximal jejunum.  The remainder of the small  bowel was felt to be normal.  Because of this finding, Dr. Laural Benes went  ahead and did an upper endoscopy.  The upper endoscopy showed normal stomach  and duodenum.  A small bowel biopsy was performed.  The esophagus showed  evidence of erosive mucosa at the esophagogastric junction,  consistent with  reflux.  By 12/3, the patient was eating, he was no longer vomiting and  therefore decision was made to discharge him at that time.   DISCHARGE MEDICATIONS:  Consist of the patient's usual medicines which are;  1. Neurontin 600 mg b.i.d.  2. Remeron 30 mg b.i.d.  3. Lexapro 20 mg daily.   In addition, the patient is advised to take Reglan 10 mg a.c. and h.s. and  Protonix 40 mg daily.  He was strongly encouraged to return to see Dr. Foy Guadalajara  in about seven days as an outpatient.  Presumably, he will continue his  usual  pain regimen which consists of morphine 30 mg four times daily.  I suggested  to him that it would be extremely valuable to consider gradually tapering  his  narcotic medication.   The patient's condition at the time of discharge was good.                                               Sherin Quarry, MD    SY/MEDQ  D:  01/18/2002  T:  01/18/2002  Job:  [id number]   cc:   Molly Maduro L. Foy Guadalajara, M.D.  [address] [place]  [place] [address]  Fax: [phone]   Danise Edge, M.D.  [address]  [place]  [place] [address]  Fax: [phone]

## 2010-07-04 NOTE — Discharge Summary (Signed)
Martin Singleton, Martin Singleton                          ACCOUNT NO.:  [id number]   MEDICAL RECORD NO.:  [id number]                   PATIENT TYPE:  INP   LOCATION:  2033                                 FACILITY:  [facility]   PHYSICIAN:  Beth A. Sheppard Penton, M.D.                  DATE OF BIRTH:  [birth year]   DATE OF ADMISSION:  09/15/2001  DATE OF DISCHARGE:  09/19/2001                                 DISCHARGE SUMMARY   DISCHARGE DIAGNOSES:  1. Partial small bowel obstruction versus ileus.     A. Possibly precipitated by acute poisoning or gastroenteritis.     B. Aggravated by chronic narcotic use.     C. Resolved with conservative measures.  2. Acute narcotic withdrawal secondary to vomiting.     A. Transient confusion, resolved.  3. Rhabdomyolysis.     A. Recent strenuous exercise plus dehydration.     B. Peak creatine kinase 4296, discharge creatine kinase 148.     C. Renal function normal.  4. Transaminitis.     A. Abdominal ultrasound with contracted gallbladder (nonspecific        finding).  No right upper quadrant tenderness.     B. Hepatitis B core antibody negative, hepatitis C antibody negative, and        others pending.  5. Dehydration.     A. Hypokalemia, replaced.     B. Hypomagnesemia, replaced.  6. Diarrhea on the day of discharge.     A. Suspect secondary to three doses of Reglan.  7. Leukocytosis, resolved.  8. Chronic back pain.     A. Status post cervical diskectomy and fusion, C4-5.     B. Chronic narcotic use.  9. Borderline hypertension.  10.      Depression.  11.      History of migraine headaches.  12.      Polysubstance abuse.     A. Opiates and marijuana on urine drug screen.  13.      Hematuria likely secondary to rhabdomyolysis.  14.      Low thyroid-stimulating hormone (0.314).     A. Free T4 normal at 1.48, free T3 normal at 3.6.  15.      Transient hypercalcemia on D5 intravenous fluids.     A. Direct hemoglobin 5.7%.  16.      Tobacco use.     A. One pack  per day.  17.      Low-grade temperatures, etiology unclear.  18.      No known drug allergies.   DISCHARGE MEDICATIONS:  1. (Decreased dose) MS Contin 60 mg q.12h.  Dispense #28 with no refills.     (Previously on 100 mg twice a day.)  2. (Decreased dose) Percocet 7.5 mg one p.o. q.6h. p.r.n. (maximum of six     per day).  (Previously taking four to six per day.)  3. Lexapro 10 mg daily.  4. (Increased dose) Neurontin 600 mg two pills t.i.d. (previously b.i.d.).  5. Remeron 30 mg p.o. b.i.d.  6. Phenergan 25 mg q.6h. p.r.n. nausea.   Do not take Celebrex.   CONDITION ON DISCHARGE:  Stable.  The patient is tolerating a soft solid  diet.  Temperature maximum 100.4 degrees, BP 150/84, heart rate 66,  respiratory rate 18, oxygen saturation 97% on room air.   DISPOSITION:  Going home with wife.   ACTIVITY:  As tolerated.   DIET:  Bland, soft solid, lactose free.  Drink plenty of water.   SPECIAL INSTRUCTIONS:  1. Call or return if you develop pain, vomiting, or fever or have questions.  2. Do not smoke.   FOLLOW-UP:  The patient has an appointment to see Robert L. Foy Guadalajara, M.D., on  Friday, September 23, 2001, at 2:45 p.m.  At that visit, Dr. Foy Guadalajara will be  reassessing his abdominal symptoms, including nausea and diarrhea.  He  should check a comprehensive metabolic panel, focusing on liver function  tests and potassium, as well as rechecking a blood pressure.   CONSULTATIONS:  Surgery, Sandria Bales. Ezzard Standing, M.D.   PROCEDURES:  1. Abdominal CT:  Nonspecific small bowel obstruction.  No other significant     abnormalities.  2. Abdominal ultrasound (September 16, 2001):  Contracted gallbladder with     thickened wall.  Nonspecific finding, but no Murphy sign to suggest an     acute cholecystitis.  3. Chest x-ray (September 15, 2001):  No acute disease.  4. Electrocardiogram:  Normal sinus rhythm, incomplete right bundle branch     block, prolonged QT at 542.  The patient's potassium at the time  of that     EKG was 2.7.  Potassium was replaced.   HOSPITAL COURSE:  1. SMALL BOWEL OBSTRUCTION:  The patient is a 55 year old gentleman with     chronic neck pain who is on high doses of MS Contin daily.  He presents     with a 24-hour history of significant nausea, vomiting, and abdominal     pain.  He apparently had had sausage the evening before and thought that     may have triggered it.  He had not had any intake over the last 24 hours     and did some strenuous yard work the day prior to this happening.  On     presentation, he was noted to be shaking/tremulous with a temperature of     99.4 degrees, blood pressure 161/97, heart rate 61, and respiratory rate     20.  His abdominal exam revealed a mildly distended belly with decreased     bowel sounds and diffusely tender without rebound.  A CT scan done in the     emergency department suggested small bowel obstruction.  Initially this     was felt secondary to high-dose narcotic use.  As things progressed, it     may have been that this was triggered by food poisoning.  Regardless, the     narcotics aggravated it.  We did get surgery to evaluate the patient.     The patient was treated conservatively and did not require an NG tube.     Slowly over the next few days, we advanced his diet and he tolerated     this.  Surgery followed along for the entire hospital stay and on the day     of discharge felt that his ileus was resolving and signed off with no  further follow-up recommended.  We have attempted to reduce the patient's     MS Contin dose to 60 mg twice a day and increased his Neurontin.  He will     follow up with Molly Maduro L. Foy Guadalajara, M.D., and probably get referral to a pain     clinic in order to help minimize his narcotic use.   1. ACUTE NARCOTIC WITHDRAWAL:  Basically the patient went for 24 hours    without his MS Contin and presented with anxiety and confusion.  Once his     pain was controlled with IV morphine, his  mentation quickly improved.   1. RHABDOMYOLYSIS:  On presentation, the patient's total CK was 3300.  His     renal function showed a creatinine of 0.8 and a BUN of 23.  The history     revealed that he had done some strenuous yard work one day prior to     getting sick.  Over the course of the past 24 hours, he had not ate or     drank anything and was significantly dehydrated.  We followed his CKs     throughout the hospital stay.  At the time of discharge, his CK was 148.   1. LEUKOCYTOSIS:  The white count on admission was 18,300.  My guess is that     it was secondary to stress from the nausea and vomiting.  He had a low-     grade fever while he was here and I am not sure of the significance of     that, but he will not be going home on antibiotics as there was no clear     source for infection.  His discharge white count was 8000.   1. DEHYDRATION/HYPOKALEMIA/HYPOMAGNESEMIA:  Secondary to vomiting.  We did     replace this orally.  Ultimately I put him on telemetry to give him IV     magnesium as well.  At the time of discharge, he had been adequately     replaced with a potassium of 2.9 and a magnesium of 2.2.  He had a     prolonged QTC on initial EKG and I think his electrolytes abnormalities     were the etiology.   1. HYPERGLYCEMIA:  Likely stress related.  This was a nonfasting value.  The     glycohemoglobin was normal.   1. TRANSAMINITIS:  The patient had mildly elevated liver function tests with     an SGOT of 66 on admission and an SGPT of 33 with a total bilirubin of     0.7 and his alkaline phosphatase was 37.  He had a strong family history     of gallbladder disease.  Given the liver function tests, I did not     ultrasound his gallbladder.  It was contracted, but this was a     nonspecific finding and he was not felt to have acute cholecystitis.  We     followed these up with repeat blood draws.  On September 17, 2001, his SGOT     was 14 with an SGPT of 53.  On the  day of discharge, I had repeated them     again and his SGOT had come up to 63 and his SGPT at 155.  I did order an     acute viral hepatitis panel, which so far has shown the hepatitis B core     antibody to be negative and the hepatitis  C antibody to be negative.  My     guess is that this is residual from his elevated CK and rhabdomyolysis.     I will have him follow up on Friday with Robert L. Foy Guadalajara, M.D., to repeat     his liver function tests.  I have held his Celebrex as that is one of the     medications that he is on that can effect the liver.  I have instructed     him only to take a maximum of two Percocet per day.   1. CHRONIC NECK PAIN:  We are decreasing his MS Contin dose and increasing     his Neurontin.  He will follow up with Molly Maduro L. Foy Guadalajara, M.D., who will     consider referral to a pain clinic if needed.   LABORATORY DATA:  Total CK 148.  Sodium 141, potassium 3.9, chloride 106,  bicarbonate 27, BUN 11, creatinine 0.8, glucose 106, SGOT 63, SGPT 155,  albumin 3.2.  Hemoglobin 13.2, WBC 8000, platelet count 250.  Free T4 148,  free T3 3.6, TSH 0.314.  Other acute viral hepatitis studies are pending  upon discharge.   I spent greater than 30 minutes arranging discharge.                                               Beth A. Sheppard Penton, M.D.    BAW/MEDQ  D:  09/19/2001  T:  09/19/2001  Job:  [id number]   cc:   Molly Maduro L. Foy Guadalajara, M.D.   Payton Doughty, M.D.   Sandria Bales. Ezzard Standing, M.D.  Fax: [phone]

## 2010-07-04 NOTE — Op Note (Signed)
NAME:  Martin Singleton, Martin Singleton                          ACCOUNT NO.:  [id number]   MEDICAL RECORD NO.:  [id number]                   PATIENT TYPE:  AMB   LOCATION:  ENDO                                 FACILITY:  [facility]   PHYSICIAN:  Danise Edge, M.D.                DATE OF BIRTH:  1955-09-03   DATE OF PROCEDURE:  05/11/2002  DATE OF DISCHARGE:                                 OPERATIVE REPORT   PROCEDURE PERFORMED:  Colonoscopy.   REFERRING PHYSICIAN:  Robert L. Foy Guadalajara, M.D.   ENDOSCOPIST:  Charolett Bumpers, M.D.   INDICATIONS FOR PROCEDURE:  The patient is a 55 year old  male born 1955-07-21.  Over the past year, the patient has been admitted at least three  times to the hospital with symptoms and radiographic signs suggesting  partial small bowel obstruction which resolves after nasogastric suction.  He has undergone a rather complete gastrointestinal evaluation except for  colonoscopy, distal ileoscopy and capsule enteroscopy to evaluate this  problem.  He has been evaluated by Dr. Claud Kelp on a couple of  occasions and found not to have a surgical abdomen.   The patient is undergoing a colonoscopy with distal ileoscopy, looking for  signs of Crohn's disease.   PREMEDICATION:  Demerol 100 mg, Versed 10 mg.   INSTRUMENT USED:  Olympus colonoscope.   DESCRIPTION OF PROCEDURE:  After obtaining informed consent, the patient was  placed in the left lateral decubitus position.  I administered intravenous  Demerol and intravenous Versed to achieve conscious sedation for the  procedure.  The patient's blood pressure, oxygen saturations and cardiac  rhythm were monitored throughout the procedure and documented in the medical  record.   Anal inspection was normal.  Digital rectal exam was normal.  The pediatric  Olympus video colonoscope was introduced into the rectum and easily advanced  to the cecum.  The ileocecal valve was intubated and distal ileum inspected.  Colonic  preparation for the exam today was excellent.   Rectum:  Normal.   Sigmoid colon and descending colon:  Scattered mucosal hemorrhages without  mucosal friability or ulceration.   Splenic flexure:  Normal.   Transverse colon:  Normal.   Hepatic flexure:  Normal.   Ascending colon:  Normal.   Cecum and ileocecal valve:  Normal.   Distal ileum:  The patient has endoscopic signs for the presence of mild  ileitis characterized by patchy red mucosa associated with aphthous ulcers.  Ileal biopsies were obtained.                                                 Danise Edge, M.D.    MJ/MEDQ  D:  05/11/2002  T:  05/12/2002  Job:  [id number]   cc:  Robert L. Foy Guadalajara, M.D.  [address] [place]  [place] [address]  Fax: [phone]   Angelia Mould. Derrell Lolling, M.D.  [address]  [place]  [place] [address]  Fax: [phone]

## 2010-07-04 NOTE — Consult Note (Signed)
Martin Singleton, Martin Singleton                          ACCOUNT NO.:  [id number]   MEDICAL RECORD NO.:  [id number]                   PATIENT TYPE:  INP   LOCATION:  4709                                 FACILITY:  [facility]   PHYSICIAN:  Jimmye Norman III, M.D.               DATE OF BIRTH:  Jan 28, 1956   DATE OF CONSULTATION:  DATE OF DISCHARGE:                                   CONSULTATION   Thank you very much for asking me to see the patient, a 55 year old  gentleman with an apparent proximal partial small bowel obstruction.   HISTORY OF PRESENT ILLNESS:  The patient has been sick since Monday when he  developed nausea and vomiting, some abdominal discomfort, fevers and chills.  He has had similar episodes of this type over the past 6-8 months, requiring  repeat visits to the emergency department, where he has been seen and  subsequently treated and sent home.   Neurosurgical consultation has been done previously. His workup has included  CT scans, upper abdominal series, upper GI series, but he has not had upper  or lower endoscopies. He has not been followed by a gastroenterologist.   His history is significant for chronic pain secondary to a neck injury  starting back in 1992, when he had a fall resulting in a cervical  compression fracture. This has led to 2 different surgeries on his C-spine,  the last one in May of 2003 by Dr. Trey Sailors, from which he continues to have  neck pain. He is currently on multiple pain medications including MS Contin  p.r.n., morphine, a Duragesic patch, Lexapro and Neurontin and he continues  to be followed by the pain service and Dr. Thyra Breed.   This current episode has had some nausea which they said was feculent  smelling fluid, no rebound or guarding. He has had no previous surgery on  his abdomen, although he has had skin cancer on his face and also the neck  surgery as mentioned previously.   MEDICATIONS:  His medications are as mentioned.   ALLERGIES:  1. CODEINE.  2. SANSERT.   PHYSICAL EXAMINATION:  VITAL SIGNS:  Temperature 100.8, other vital signs  were stable, pulse 100, blood pressure 135/84.  GENERAL:  He does appear to be in acute distress in general. He does not  look very comfortable. Skin turgor is not down. He has about 800 cc of sort  of light brownish fluid in the NG cannister.  HEENT:  He is normocephalic.  NECK:  No palpable masses, no thyroid problems.  CHEST:  Clear to auscultation.  CARDIAC:  Regular rhythm, mildly tachycardic. He does have a grade 1/6  murmur at the left lower sternal border. No lifts or heaves.  ABDOMEN:  Flat. He does have very active bowel sounds. No point tenderness.  No rebound or guarding.  RECTAL:  Examination done at an outside facility showed  no masses and was  Guaiac negative by report.   LABORATORY DATA:  White count 15,000 and does have a left shift. Amylase and  lipase have not been done but those will be ordered.   KUB demonstrates air/fluid levels in his small bowel but also gas in the  colon and no markedly dilated small bowel loops but indicative either of  partial obstruction or ileus.   ASSESSMENT:  Based on this examination the patient has an abdominal process  which has not been clearly defined. I do believe that it may be some type of  inflammatory process, possibly a ruptured appendectomy or partial  obstruction, possibly some other type of ruptured viscous with an intra-  abdominal abscess, possible Crohn's disease or ulcerative colitis and  possibly just severe gastroenteritis, but at the current point we cannot  tell. It does not appear to be surgical at this point, however, I intend to  follow the patient closely while he is hospitalized.   Currently, I think a CAT scan will help [place] decide if there is a focal  inflammatory process to rule out a ruptured appendectomy or an abscess, and  if it is the case, then we will go ahead and do surgery to correct  that. If  not, then it may require exploration in the future to define what the  problem is. The risks and benefits of this have been explained to the  patient and his wife, and we will go ahead and proceed accordingly. In the  meantime the patient will be admitted by the general medicine or family  practice service and I will follow along in case it turns into a surgical  problem.                                               Kathrin Ruddy, M.D.    JW/MEDQ  D:  04/19/2002  T:  04/19/2002  Job:  [id number]   cc:   Molly Maduro L. Foy Guadalajara, M.D.  [address] [place]  [place] [address]  Fax: [phone]   Stacie Acres. White, M.D.  [address]  [place]  [place] [address]  Fax: [phone]

## 2010-07-04 NOTE — Discharge Summary (Signed)
[place]  Patient:    Martin Singleton, MONCUS Visit Number: [id number] MRN: [id number]          Service Type: SUR Location: [id number] Attending Physician:  Emeterio Reeve Dictated by:   Payton Doughty, M.D. 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  PROCEDURE:  C4-5 anterior cervical diskectomy and fusion.  COMPLICATIONS:  None.  CONDITION ON DISCHARGE:  Well.  HISTORY OF PRESENT ILLNESS:  A 55 year old right-handed white gentleman whose history and physical is recanted in the chart.  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  He had a new MRI that shows a herniated disk at C4-5 and was admitted for exploration of his possible nonunion as well as diskectomy C4-5.  Medical history is otherwise benign save for cluster headaches and depression.  He says he get headaches with Codeine and leg cramps with Sandsert.  General examination is unremarkable.  Neurological examination is intact.  He was noted to be on MS Contin 100 mg b.i.d. that was not included in his history and physical, nor in his report to my office.  HOSPITAL COURSE:  He was admitted after ascertainment of normal laboratory values and underwent exploration of the 5-6 fusion which was found to be very solid and had a herniated disk at C4-5 on which was performed diskectomy and fusion.  Postoperatively, he complained of excessive pain.  He was given adequate pain management with a PCA.  Following cessation of the PCA yesterday, he was placed on 60 mg of MS Contin twice a day as well as Percocet for pain.  Currently, his examination is intact, his incision is well healing, he is eating and voiding normally.  He is being discharged  home on the above medications with the understanding that they will be tapered by the time he visits my office.  He also knows that if he uses more Percocet than he has been written for, he will not be given any more. Dictated by:   Payton Doughty, M.D. Attending Physician:  Emeterio Reeve DD:  06/24/01 TD:  06/27/01 Job: [id number]

## 2010-07-07 ENCOUNTER — Other Ambulatory Visit (INDEPENDENT_AMBULATORY_CARE_PROVIDER_SITE_OTHER): Payer: Self-pay | Admitting: Family Medicine

## 2010-07-07 MED ORDER — ATORVASTATIN 20 MG TABLET
20.0000 mg | ORAL_TABLET | Freq: Every evening | ORAL | Status: DC
Start: 2010-07-07 — End: 2011-08-10

## 2010-07-21 NOTE — Procedures (Signed)
[facility]   Sleep Laboratory   [address], [place] [address]   [phone]         PATIENT NAME: Henry [name], Henry [name] [facility] NUMBER: [id number]  DATE OF BIRTH: 1955-02-22   DATE OF SERVICE: 03/03/2010      March 02, 2010    Karen Chafe, MD   [place], [place] [address]     Dear Dr. Tish Frederickson:    On the evening of March 03, 2010, Henry [name] returned to the [facility] for his followup sleep study. We originally studied him on January 23, 2010, and during that sleep study, he was found to have a severely elevated apnea-hypopnea index of 53.8 events per hour of sleep with oxygen desaturations down to 63%. He is now returning for a trial of positive airway pressure therapy.    TECHNIQUE: This polysomnography consisted of the following recorded channels: Frontal, central and occipital EEG, chin EMG, ROC and LOC, right and left anterior tibial, EKG, snoring microphone, nasal/oral airflow, thoracic effort, abdominal effort, body position and oxygen saturation.    STUDY RESULTS: On the evening of his sleep study, Henry [name] was provided with a 421-minute sleep opportunity of which he actually slept 372.5 minutes. Sleep efficiency was 93%. Sleep latency was 20.5 minutes and REM latency was 40.5 minutes. He spent 7% of recording time awake with 5.1% stage N1, 57.2% stage N2, 0% N3 and 30.7% REM.     There were 6 apneas recorded, of which 1 was obstructive and 5 were central. In addition, there were 4 obstructive hypopneas recorded. His apnea-hypopnea index for the evening was within normal limits at 1.61 events per hour of sleep. REM AHI was also normal at 4.39 events per hour of sleep. His lowest oxygen saturation went to 86%. He was started on CPAP and the pressure was titrated to a final setting of 7 cm. We studied him for 338.5 minutes at this pressure and 34.1% of the time at this pressure was spent in REM sleep. Apnea-hypopnea index for the period time he was on CPAP at 7 cm was normal at 1.52 events per hour of sleep.    There were 220 myoclonic events scored that had a periodic quality to their occurrence. He had a periodic limb movement index calculated of 35.4 events per hour of sleep, which would be considered elevated. In the ECG tracing, sinus rhythm was noted. Maximum heart rate while asleep was 75 beats per minute and minimum heart rate was 53 beats per minute. There were no significant EEG abnormalities noted. No parasomnias or abnormal behaviors were observed.    CLINICAL INTERPRETATION: The results of Henry [name] follow up sleep study indicates that CPAP at a pressure setting of 7 cm will be effective in maintaining a normal apnea-hypopnea index. I am going to prescribe this pressure for him and also suggest the use of heated humidity with a Fisher-Paykel Zest nasal mask. A followup appointment will be arranged for him in the sleep disorders clinic roughly 1-2 months after he has received his machine so that we can check on his progress. Thank you for referring Henry [name] to the [facility] and please do not hesitate to contact me if you have any questions or comments regarding his evaluation.    Sincerely,        Henry [name]  Ellyn Hack, MD  Director, Sleep Laboratory  [facility] Department of Neurology and Psychiatry     [id number]; D: 03/10/2010 08:13:88; T: 03/11/2010 11:32:12    cc: Henry Kidney MD   Henry [name]

## 2010-07-21 NOTE — Procedures (Signed)
[facility]   Sleep Laboratory   [address] [place], [place] [address]   [phone]         PATIENT NAME: Henry [name], Henry [name]   HOSPITAL NUMBER: [id number]  DATE OF BIRTH: 1955/10/31   DATE OF SERVICE: 01/23/2010      January 23, 2010     Karen Chafe MD  [address] [place], [place] [address]      Dear Dr. Tish Frederickson:    On the evening of January 23, 2010, Quitman Norberto reported to the [facility] to undergo an all-night polysomnographic study. He is a 55 year old, 69-inch, 287 pound male (body mass index of 42.3) who was referred in to evaluate for possible sleep apnea on the basis of fragmented sleep, excessive daytime sleepiness and loud snoring. His past medical history is significant for heartburn and arthritis. He has been prescribed dicyclomine for irritable bowel, metoprolol for hypertension, diclofenac for arthritis, Lipitor for high cholesterol and he also takes omeprazole for heartburn, aspirin, Tylenol, multivitamin and MetroGel ointment for rosacea. He does not smoke and reports drinking alcohol on a weekly basis. He only drinks decaffeinated beverages. His typical bedtime is between 11:00 p.m. and midnight. He usually falls asleep in under 5 minutes. He will awaken 2-3 times during the night and generally gets out of bed around 5:30 in the morning. He does not usually take naps during the day. He had a score of 17 on the Epworth scale which would indicate significant problems with excessive daytime sleepiness. On a routinely administered depression questionnaire, he did not endorse a significant degree of depressive symptoms.     TECHNIQUE: This polysomnography consisted of the following recorded channels: Frontal, central and occipital EEG, chin EMG, ROC and LOC, right and left anterior tibial, EKG, snoring microphone, nasal/oral airflow, thoracic effort, abdominal effort, oxygen saturation and body position.    STUDY RESULTS: On the evening of his sleep study, Hamdan Toscano was provided with a 413.5-minute sleep opportunity of which he actually slept 329 minutes. Sleep efficiency was 80%. Sleep latency was 9 minutes and REM latency was 227 minutes. He spent 15.5% of the recording time awake with 9.0% stage N1, 63.2% stage N2, 0% stage N3 and 12.3% REM.    There was 1 central apnea recorded; however, there were 294 obstructive hypopneas recorded. Apnea-hypopnea index for the evening was severely elevated at 53.8 events per hour of sleep. His respiratory events were associated with significant oxygen desaturations down to 63%.    There were 5 myoclonic events scored that had a periodic quality to their occurrence. He had a periodic limb movement index calculated that was within normal limits at 0.9 events per hour of sleep. In the ECG tracing, maximum heart rate while asleep was 95 beats per minute and minimum heart rate was 61 beats per minute. There were no significant EEG abnormalities noted. No parasomnias or abnormal behaviors were observed.     CLINICAL INTERPRETATION: The results of Sadao Hudgins's all-night polysomnographic study confirms he has a severe problem with obstructive-type sleep breathing problems. His apnea-hypopnea index for the evening was severely elevated at 53.80 events per hour of sleep. His respiratory events were associated with oxygen saturations down to 63%. On the basis of these findings, I am going to suggest that Mr. Welter return for a trial of positive airway pressure therapy to see if this might be an effective intervention for him.  Thank you for referring Primus Gritton to the [facility] and I will be notifying you of the results of his CPAP trial upon its completion.    Sincerely,        Awilda Bill, MD  Director, Sleep Laboratory  [facility] Department of Neurology and Psychiatry    XID/[id number]; D: 01/29/2010 17:03:09; T: 01/29/2010 17:31:23    cc: Stanton Kidney MD   Suanne Marker

## 2010-10-30 ENCOUNTER — Other Ambulatory Visit (INDEPENDENT_AMBULATORY_CARE_PROVIDER_SITE_OTHER): Payer: Self-pay | Admitting: Family Medicine

## 2010-10-30 MED ORDER — DICLOFENAC SODIUM 75 MG TABLET,DELAYED RELEASE
75.0000 mg | DELAYED_RELEASE_TABLET | Freq: Two times a day (BID) | ORAL | Status: DC
Start: 2010-10-30 — End: 2011-04-27

## 2010-11-03 ENCOUNTER — Encounter (INDEPENDENT_AMBULATORY_CARE_PROVIDER_SITE_OTHER): Payer: Self-pay | Admitting: Family Medicine

## 2010-11-03 NOTE — Progress Notes (Signed)
[facility]flu[facility] given at [facility]. Documented.

## 2010-12-25 ENCOUNTER — Encounter (HOSPITAL_BASED_OUTPATIENT_CLINIC_OR_DEPARTMENT_OTHER): Payer: BC Managed Care – PPO | Admitting: Neurology

## 2010-12-29 ENCOUNTER — Other Ambulatory Visit (INDEPENDENT_AMBULATORY_CARE_PROVIDER_SITE_OTHER): Payer: Self-pay | Admitting: Family Medicine

## 2010-12-29 NOTE — Telephone Encounter (Signed)
Pharmacy requesting medication refill

## 2010-12-30 MED ORDER — METOPROLOL SUCCINATE ER 25 MG TABLET,EXTENDED RELEASE 24 HR
25.0000 mg | ORAL_TABLET | Freq: Every day | ORAL | Status: DC
Start: 2010-12-29 — End: 2011-06-04

## 2010-12-30 NOTE — Telephone Encounter (Signed)
Pharmacy requesting medication

## 2011-01-23 DIAGNOSIS — R197 Diarrhea, unspecified: Secondary | ICD-10-CM | POA: Insufficient documentation

## 2011-01-23 DIAGNOSIS — R110 Nausea: Secondary | ICD-10-CM | POA: Insufficient documentation

## 2011-01-29 ENCOUNTER — Other Ambulatory Visit (INDEPENDENT_AMBULATORY_CARE_PROVIDER_SITE_OTHER): Payer: Self-pay | Admitting: Family Medicine

## 2011-01-29 MED ORDER — DICYCLOMINE 20 MG TABLET
20.0000 mg | ORAL_TABLET | Freq: Four times a day (QID) | ORAL | Status: DC
Start: 2011-01-29 — End: 2011-06-04

## 2011-01-29 NOTE — Telephone Encounter (Signed)
Pharmacy requesting medication refill

## 2011-03-19 ENCOUNTER — Encounter (HOSPITAL_BASED_OUTPATIENT_CLINIC_OR_DEPARTMENT_OTHER): Payer: BC Managed Care – PPO | Admitting: Neurology

## 2011-04-13 ENCOUNTER — Ambulatory Visit (INDEPENDENT_AMBULATORY_CARE_PROVIDER_SITE_OTHER): Payer: Self-pay | Admitting: Family Medicine

## 2011-04-13 NOTE — Telephone Encounter (Signed)
 Message copied by Heitor Steinhoff E on Mon Apr 13, 2011  3:28 PM  ------       Message from: GIBSON RAISIN C       Created: Mon Apr 13, 2011  2:11 PM         >> [name] 04/13/2011 02:11 PM       Henry [name]              [name]  Needs order  for blood work before apt on 06/04/11               Thank you

## 2011-04-14 ENCOUNTER — Other Ambulatory Visit (INDEPENDENT_AMBULATORY_CARE_PROVIDER_SITE_OTHER): Payer: Self-pay | Admitting: Family Medicine

## 2011-04-27 ENCOUNTER — Other Ambulatory Visit (INDEPENDENT_AMBULATORY_CARE_PROVIDER_SITE_OTHER): Payer: Self-pay | Admitting: Family Medicine

## 2011-04-27 MED ORDER — DICLOFENAC SODIUM 75 MG TABLET,DELAYED RELEASE
75.0000 mg | DELAYED_RELEASE_TABLET | Freq: Two times a day (BID) | ORAL | Status: DC
Start: 2011-04-27 — End: 2011-08-19

## 2011-04-27 NOTE — Telephone Encounter (Signed)
Voltaren E-rxd

## 2011-05-28 ENCOUNTER — Ambulatory Visit (HOSPITAL_BASED_OUTPATIENT_CLINIC_OR_DEPARTMENT_OTHER): Payer: BC Managed Care – PPO | Admitting: Neurology

## 2011-05-28 ENCOUNTER — Ambulatory Visit
Admission: RE | Admit: 2011-05-28 | Discharge: 2011-05-28 | Disposition: A | Discharge status: Home / Self Care | Payer: BC Managed Care – PPO | Source: Ambulatory Visit | Attending: Family Medicine | Admitting: Family Medicine

## 2011-05-28 VITALS — BP 145/92 | HR 79 | Temp 98.1°F | Wt 297.8 lb

## 2011-05-28 DIAGNOSIS — E7800 Pure hypercholesterolemia, unspecified: Secondary | ICD-10-CM | POA: Insufficient documentation

## 2011-05-28 DIAGNOSIS — I10 Essential (primary) hypertension: Secondary | ICD-10-CM | POA: Insufficient documentation

## 2011-05-28 DIAGNOSIS — N400 Enlarged prostate without lower urinary tract symptoms: Secondary | ICD-10-CM | POA: Insufficient documentation

## 2011-05-28 LAB — BASIC METABOLIC PANEL, FASTING
ANION GAP: 7 mmol/L (ref 5–16)
BUN/CREAT RATIO: 13 (ref 6–22)
BUN: 11 mg/dL (ref 8–26)
CALCIUM: 8.8 mg/dL (ref 8.5–10.4)
CARBON DIOXIDE: 26 mmol/L (ref 23–33)
CHLORIDE: 105 mmol/L (ref 96–111)
CREATININE: 0.86 mg/dL (ref 0.62–1.27)
ESTIMATED GLOMERULAR FILTRATION RATE: >59 ml/min/1.73m2 (ref >59)
GLUCOSE, FASTING: 110 mg/dL — ABNORMAL HIGH (ref 70–105)
POTASSIUM: 4 mmol/L (ref 3.5–5.1)
SODIUM: 138 mmol/L (ref 136–145)

## 2011-05-28 LAB — LIPID PANEL
CHOLESTEROL: 159 mg/dL (ref <200)
HDL-CHOLESTEROL: 40 mg/dL (ref >39)
LDL (CALCULATED): 87 mg/dL (ref <100)
NON - HDL (CALCULATED): 119 mg/dL (ref <190)
TRIGLYCERIDES: 161 mg/dL — ABNORMAL HIGH (ref <150)
VLDL (CALCULATED): 32 mg/dL — ABNORMAL HIGH (ref <30)

## 2011-05-28 LAB — ALT (SGPT): ALT (SGPT): 51 U/L (ref 7–55)

## 2011-05-28 LAB — AST (SGOT): AST (SGOT): 32 U/L (ref 8–48)

## 2011-05-28 LAB — PSA SCREENING: PROSTATE SPECIFIC AG: 2.8 ng/mL (ref 0.0–4.0)

## 2011-05-28 LAB — CREATINE KINASE (CK), TOTAL, SERUM OR PLASMA: CREATINE KINASE (CK): 102 U/L (ref 48–222)

## 2011-05-28 NOTE — Progress Notes (Signed)
 Name of Patient: Henry [name]  Date of Birth: 08-Aug-1955  Date of Service: 05/28/2011    CHIEF COMPLAINT:  Obstructive sleep apnea     SUBJECTIVE:   We had the pleasure of seeing Henry [name] in the Sleep Disorders Clinic at the [place]  [facility] for follow up of his obstructive sleep apnea. The patient reports that he is doing Well in regards to sleep.  He is currently on CPAP at a pressure setting of 7 cm    The patient is using his CPAP 7 nights per week with a Nasal interface which he wears for approximately 6-8 hours per night.  The interface does fit well, he has not noted facial irritation, and he does not snore when using his machine.      The patient reports he feels CPAP has been helpful and he is less sleepy during the day.      Patient Active Problem List   Diagnosis   . Benign Prostatic Hypertropy   . Hyperlipidemia   . BPH   . Hypertension   . Irritable bowel syndrome   . Hypercholesterolemia   . Osteoarthritis, knee   . Tear, meniscus   . Osteoarthritis of hip   . Normal colonoscopy 2008   . OSA on CPAP         Outpatient Prescriptions Prior to Visit:  diclofenac  sodium (VOLTAREN ) 75 mg Oral Tablet, Delayed Release (E.C.) Take 1 Tab (75 mg total) by mouth Twice daily   dicyclomine  (BENTYL ) 20 mg Oral Tablet take 1 Tab by mouth Four times a day.   metoprolol  succinate (TOPROL  XL) 25 mg Oral Tablet Sustained Release 24 hr take 1 Tab by mouth Once a day.   atorvastatin  (LIPITOR) 20 mg Oral Tablet take 1 Tab by mouth QPM.   Dicyclomine  (BENTYL ) 20 mg Oral Tablet take 1 Tab by mouth Four times a day.   Omeprazole  20 mg Oral Tablet, Delayed Release (E.C.) take  by mouth.   metoprolol  succinate (TOPROL  XL) 25 mg Oral Tablet Sustained Release 24 hr take 1 Tab by mouth Once a day.   Chondroitin Sulfate A 250 mg Cap Twice daily.    ASPIR-81 81 mg TbEC qd   ZANTAC 150 mg Tab qd   GLUCOS CHOND CPLX ADVANCED ORAL qd   M-VIT ORAL qd       OBJECTIVE:   Blood  pressure 145/92, pulse 79, temperature 98.1. Weight 135.1 kg. Oxygen saturation on room air 96%.   Gen: Well-dressed and well-groomed. Pleasant and cooperative with examination.   HEENT: Unremarkable  The patient has a Mallampati class 3 airway.  Neck size: 18-1/2 in.  Respiratory: Clear to auscultation in all fields   CV: Regular rate and rhythm without murmur  Skin: No irritation of the face at the site of the CPAP mask   Neuro: Alert and oriented x4. No focal motor weakness. Gait normal.    LABORATORY DATA:   An Epworth Sleepiness Scale was completed by the patient in clinic today and the sum total score was 8.     ASSESSMENT/PLAN:   Obstructive sleep apnea, Sleepiness is well controlled on CPAP.  Continue CPAP at 7 cm  We reminded him to replace his interface at least every 6 months to insure continued proper interface fit and seal.  We asked him to return to Sleep clinic in 12 months for follow up.     Total face-to-face time by staff:   minutes. Greater than  50% of that time was spent on counseling/coordination of care regarding:

## 2011-06-04 ENCOUNTER — Encounter (INDEPENDENT_AMBULATORY_CARE_PROVIDER_SITE_OTHER): Payer: Self-pay | Admitting: Family Medicine

## 2011-06-04 ENCOUNTER — Ambulatory Visit: Payer: BC Managed Care – PPO | Attending: Family Medicine | Admitting: Family Medicine

## 2011-06-04 VITALS — BP 142/80 | HR 100 | Temp 97.9°F | Ht 70.0 in | Wt 296.3 lb

## 2011-06-04 DIAGNOSIS — Z0000 Encounter for general adult medical examination without abnormal findings: Secondary | ICD-10-CM | POA: Insufficient documentation

## 2011-06-04 DIAGNOSIS — E785 Hyperlipidemia, unspecified: Secondary | ICD-10-CM | POA: Insufficient documentation

## 2011-06-04 DIAGNOSIS — G4733 Obstructive sleep apnea (adult) (pediatric): Secondary | ICD-10-CM | POA: Insufficient documentation

## 2011-06-04 DIAGNOSIS — N400 Enlarged prostate without lower urinary tract symptoms: Secondary | ICD-10-CM | POA: Insufficient documentation

## 2011-06-04 DIAGNOSIS — K589 Irritable bowel syndrome without diarrhea: Secondary | ICD-10-CM | POA: Insufficient documentation

## 2011-06-04 NOTE — Progress Notes (Signed)
Subjective:     Patient ID:  Henry [name] is an 56 y.o. male   Chief Complaint:    Chief Complaint   Patient presents with   . Physical       HPI Comments: 56 year old gentleman here for physical examination and followup on chronic medical conditions which include osteoarthritis, hypercholesterolemia, history of BPH. Patient reports relief but not resolution of the pain using over-the-counter medication Osteo Bi-Flex.    Current Outpatient Prescriptions:  diclofenac sodium (VOLTAREN) 75 mg Oral Tablet, Delayed Release (E.C.), Take 1 Tab (75 mg total) by mouth Twice daily  atorvastatin (LIPITOR) 20 mg Oral Tablet, take 1 Tab by mouth QPM.  Dicyclomine (BENTYL) 20 mg Oral Tablet, take 1 Tab by mouth Four times a day.  Omeprazole 20 mg Oral Tablet, Delayed Release (E.C.), take  by mouth.  metoprolol succinate (TOPROL XL) 25 mg Oral Tablet Sustained Release 24 hr, take 1 Tab by mouth Once a day.  ASPIR-81 81 mg TbEC, qd  M-VIT ORAL, qd  DISCONTD: dicyclomine (BENTYL) 20 mg Oral Tablet, take 1 Tab by mouth Four times a day.  DISCONTD: metoprolol succinate (TOPROL XL) 25 mg Oral Tablet Sustained Release 24 hr, take 1 Tab by mouth Once a day.  Chondroitin Sulfate A 250 mg Cap, Twice daily.   ZANTAC 150 mg Tab, qd  GLUCOS CHOND CPLX ADVANCED ORAL, qd      ALLERGIES:   -- Sulfa (Sulfonamides) -- Rash    Past Medical History:    HTN                                                           Hyperlipidemia                                                Past Surgical History:    PB COLONOSCOPY,DIAGNOSTIC                                     HX APPENDECTOMY                                               PB LEG/ANKLE SURGERY PROC UNLISTED                            Social History    Marital Status: Married             Spouse Name:                       Years of Education:                 Number of children:               Occupational History  Occupation          Tax inspector  [employer]            Social History Main Topics    Smoking Status: Never Smoker                      Smokeless Status: Never Used                        Comment: occ. cigar    Alcohol Use: Yes                Comment: occ.    Drug Use: No            Other Topics            Concern  Abuse/Domestic Violence No  Caffeine Concern        No  Calcium intake adequate Yes  Computer Use            Yes  Drives                  Yes  Exercise Concern        No  Seat Belt               Yes  Special Diet            No  Sunscreen used          Yes  Right hand dominant     Yes            Review of Systems   Constitutional: Negative.    HENT: Positive for neck pain.    Eyes: Negative.    Respiratory: Negative.    Cardiovascular: Negative.    Gastrointestinal: Negative.    Genitourinary: Negative.    Musculoskeletal: Positive for back pain.   Skin: Negative.    Neurological: Negative.    Psychiatric/Behavioral: Negative.      Objective:   Physical Exam   Constitutional: He is oriented to person, place, and time. He appears well-developed and well-nourished.        BP 142/80   Pulse 100   Temp(Src) 36.6 C (97.9 F) (Thermal Scan)   Ht 1.778 m (5' 10" )   Wt 134.4 kg (296 lb 4.8 oz)   BMI 42.51 kg/m2     HENT:   Mouth/Throat: Oropharynx is clear and moist. No oropharyngeal exudate.   Eyes: Pupils are equal, round, and reactive to light.   Neck: Neck supple. No thyromegaly present.   Cardiovascular: Normal rate and regular rhythm.  Exam reveals no gallop and no friction rub.    No murmur heard.  Pulm:  Effort normal and breath sounds normal.  Observations:  no respiratory distress.    There are no rales present.    There are no wheezes present.  The chest exhibits no tenderness.    Abdomen:   Bowel sounds are normal. He exhibits no mass. Soft. No tenderness.   GU:      Prostate Exam:  Prostate normal to palpation.      Rectal Exam:   Rectum normal to exam.   Guaiac negative stool.    Ortho/Musculoskeletal:    He exhibits no edema.   Lymphadenopathy:     He has no cervical adenopathy.   Neurological: He is alert and oriented to person, place, and time.   Skin: Skin is warm and dry.        There are several tan to dark brown "stuck on" lesions on the  chest and back consistent with a seborrheic keratosis.   Psychiatric: He has a normal mood and affect. His behavior is normal. Judgment and thought content normal.     .    Assessment & Plan:     V70.0 Physical exam, routine  (primary encounter diagnosis)  Comment: Normal physical examination for age patient is overweight for were filled out recording his BM and body fat percentage which were faxed to his managed care company  Plan:     272.4 Hyperlipidemia  Comment:   Lab Results   Component Value Date    CHOLESTEROL 159 05/28/2011    HDLCHOL 40 05/28/2011    LDLCHOL 87 05/28/2011    TRIG 161* 05/28/2011        Plan: He will continue current care    600.00 BPH  Comment: Patient is asymptomatic  Plan:     401.9 Hypertension  Comment: Blood pressure today acceptable  Plan:     564.1 Irritable bowel syndrome  Comment: Patient is asymptomatic  Plan: Continue current symptomatic care as he is done    327.23 OSA on CPAP  Comment: Patient has good result the CPAP he uses it every night takes it when traveling.  Plan: Continue to encourage use of CPAP.

## 2011-06-04 NOTE — Progress Notes (Signed)
 [facility] Screen form faxed to [phone].  Henry [name]

## 2011-06-08 DIAGNOSIS — F332 Major depressive disorder, recurrent severe without psychotic features: Secondary | ICD-10-CM | POA: Diagnosis not present

## 2011-06-08 DIAGNOSIS — M542 Cervicalgia: Secondary | ICD-10-CM | POA: Diagnosis not present

## 2011-06-08 DIAGNOSIS — F411 Generalized anxiety disorder: Secondary | ICD-10-CM | POA: Diagnosis not present

## 2011-06-08 DIAGNOSIS — G894 Chronic pain syndrome: Secondary | ICD-10-CM | POA: Diagnosis not present

## 2011-08-10 ENCOUNTER — Ambulatory Visit (INDEPENDENT_AMBULATORY_CARE_PROVIDER_SITE_OTHER): Payer: Self-pay | Admitting: Family Medicine

## 2011-08-10 NOTE — Telephone Encounter (Signed)
 Message copied by Levonte Molina on Mon Aug 10, 2011  2:27 PM  ------       Message from: WADDELL LINE       Created: Mon Aug 10, 2011  1:24 PM       Regarding: refill         >> LINE [facility] 08/10/2011 01:24 PM       Dr. Sergio pt.//Pharmacist states that the pt would like to request a 90 day supply.  He states the pt would also like to request that you decrease the mg's on the medicine or change the dosage to every other day since his levels improved.  Thank you.               Refill: atorvastatin  (LIPITOR) 20 mg Oral Tablet, take 1 Tab by mouth QPM.               [facility] [address] [place] [facility] [address]         [address] [facility] [place] [address]         Phone: [phone] Fax: [phone]

## 2011-08-14 MED ORDER — ATORVASTATIN 20 MG TABLET
20.0000 mg | ORAL_TABLET | Freq: Every evening | ORAL | Status: DC
Start: 2011-08-10 — End: 2012-08-09

## 2011-08-19 ENCOUNTER — Other Ambulatory Visit (INDEPENDENT_AMBULATORY_CARE_PROVIDER_SITE_OTHER): Payer: Self-pay | Admitting: Family Medicine

## 2011-08-19 MED ORDER — DICLOFENAC SODIUM 75 MG TABLET,DELAYED RELEASE
75.0000 mg | DELAYED_RELEASE_TABLET | Freq: Two times a day (BID) | ORAL | Status: DC
Start: 2011-08-19 — End: 2012-07-29

## 2011-08-19 NOTE — Telephone Encounter (Signed)
Message copied by Wayland Salinas on Wed Aug 19, 2011  9:13 AM  ------       Message from: [place], [place]: Wed Aug 19, 2011  8:57 AM         >> Salvatore Decent 08/19/2011 08:57 AM       SELBY PT               Pharmacy called for refill-- needs 90 day supply per insurance.               diclofenac sodium (VOLTAREN) 75 mg Oral Tablet, Delayed Release (E.C.) #180  Take 1 Tab (75 mg total) by mouth Twice daily               Preferred Pharmacy          [id number] [place] [place]        [id number]        Phone: [phone]: [phone]                Thanks.

## 2011-09-18 ENCOUNTER — Encounter (HOSPITAL_BASED_OUTPATIENT_CLINIC_OR_DEPARTMENT_OTHER): Payer: Self-pay | Admitting: Neurology

## 2011-09-18 NOTE — Progress Notes (Signed)
Henry [name] called and stated his wife says he is snoring through his mask. We are increasing his pressure from 7 cm to 9 cm to see if this helps. A script was sent to [facility] for this today.  Henry [name] is to call me in two weeks to see if this has improved or if we need to restudy him .   Peggy Gunnoe,RPSGT

## 2011-12-10 DIAGNOSIS — R6882 Decreased libido: Secondary | ICD-10-CM | POA: Diagnosis not present

## 2011-12-10 DIAGNOSIS — G894 Chronic pain syndrome: Secondary | ICD-10-CM | POA: Diagnosis not present

## 2011-12-10 DIAGNOSIS — Z23 Encounter for immunization: Secondary | ICD-10-CM | POA: Diagnosis not present

## 2011-12-18 ENCOUNTER — Other Ambulatory Visit (INDEPENDENT_AMBULATORY_CARE_PROVIDER_SITE_OTHER): Payer: Self-pay | Admitting: Family Medicine

## 2011-12-18 MED ORDER — METOPROLOL SUCCINATE ER 25 MG TABLET,EXTENDED RELEASE 24 HR
25.0000 mg | ORAL_TABLET | Freq: Every day | ORAL | Status: DC
Start: 2011-12-18 — End: 2012-12-13

## 2012-01-26 ENCOUNTER — Other Ambulatory Visit (INDEPENDENT_AMBULATORY_CARE_PROVIDER_SITE_OTHER): Payer: Self-pay | Admitting: Family Medicine

## 2012-01-26 ENCOUNTER — Encounter (INDEPENDENT_AMBULATORY_CARE_PROVIDER_SITE_OTHER): Payer: Self-pay | Admitting: Family Medicine

## 2012-01-27 MED ORDER — DICYCLOMINE 20 MG TABLET
20.0000 mg | ORAL_TABLET | Freq: Four times a day (QID) | ORAL | Status: DC
Start: 2012-01-26 — End: 2013-01-24

## 2012-03-22 ENCOUNTER — Ambulatory Visit (INDEPENDENT_AMBULATORY_CARE_PROVIDER_SITE_OTHER): Payer: BC Managed Care – PPO | Admitting: Family Medicine

## 2012-05-18 DIAGNOSIS — G894 Chronic pain syndrome: Secondary | ICD-10-CM | POA: Diagnosis not present

## 2012-05-18 DIAGNOSIS — H029 Unspecified disorder of eyelid: Secondary | ICD-10-CM | POA: Diagnosis not present

## 2012-05-18 DIAGNOSIS — E7800 Pure hypercholesterolemia, unspecified: Secondary | ICD-10-CM | POA: Diagnosis not present

## 2012-05-26 ENCOUNTER — Encounter (HOSPITAL_BASED_OUTPATIENT_CLINIC_OR_DEPARTMENT_OTHER): Payer: BC Managed Care – PPO | Admitting: Neurology

## 2012-06-02 ENCOUNTER — Ambulatory Visit (HOSPITAL_BASED_OUTPATIENT_CLINIC_OR_DEPARTMENT_OTHER): Payer: Self-pay | Admitting: Neurology

## 2012-06-02 VITALS — BP 150/95 | HR 74 | Temp 98.1°F | Wt 297.0 lb

## 2012-06-02 NOTE — Progress Notes (Signed)
Name of Patient: Henry [name]  Date of Birth: 24-May-1955  Date of Service: 06/02/2012    CHIEF COMPLAINT:  Obstructive sleep apnea     SUBJECTIVE:   We had the pleasure of seeing Henry [name] in the Sleep Disorders Clinic at the [place] for follow up of his obstructive sleep apnea. The patient reports that he is doing well in regards to sleep.  He is currently on CPAP at a pressure setting of 9 cm. Last summer we increase the pressure empirically from 7 cm to 9 cm because his wife was reporting snoring. Since the increased snoring is no longer an issue    The patient is using his CPAP 7 nights per week with a nasal mask interface which he wears for approximately 5 hours per night.  The interface does fit well, he has not noted facial irritation, and he does not snore when using his machine.      The patient reports he feels CPAP has been helpful and he is less sleepy during the day.      Patient Active Problem List   Diagnosis   . Benign Prostatic Hypertropy   . Hyperlipidemia   . BPH   . Hypertension   . Irritable bowel syndrome   . Hypercholesterolemia   . Osteoarthritis, knee   . Tear, meniscus   . Osteoarthritis of hip   . Normal colonoscopy 2008   . OSA on CPAP         Outpatient Prescriptions Prior to Visit:  ASPIR-81 81 mg TbEC qd   atorvastatin (LIPITOR) 20 mg Oral Tablet Take 1 Tab (20 mg total) by mouth Every evening   Chondroitin Sulfate A 250 mg Cap Twice daily.    diclofenac sodium (VOLTAREN) 75 mg Oral Tablet, Delayed Release (E.C.) Take 1 Tab (75 mg total) by mouth Twice daily   dicyclomine (BENTYL) 20 mg Oral Tablet Take 1 Tab (20 mg total) by mouth Four times a day   GLUCOS CHOND CPLX ADVANCED ORAL qd   M-VIT ORAL qd   metoprolol succinate (TOPROL XL) 25 mg Oral Tablet Sustained Release 24 hr Take 1 Tab (25 mg total) by mouth Once a day   Omeprazole 20 mg Oral Tablet, Delayed Release (E.C.) take  by mouth.   ZANTAC 150 mg Tab qd      No facility-administered medications prior to visit.    OBJECTIVE:   Blood pressure 150/95, pulse 74, temperature 98.1, weight 134.7 kg. Oxygen saturation on room air 97%.  Gen: Patient is well-dressed and well-groomed. Pleasant and cooperative.   HEENT: Unremarkable  The patient has a Mallampati class 2-3 airway.  Neck size: 18 in.  Respiratory: Clear to auscultation in all fields   CV: Regular rate and rhythm without murmur  Skin: No irritation of the face at the site of the CPAP mask   Neuro: Alert and oriented x4. No focal motor weakness. Gait normal.    LABORATORY DATA:   An Epworth Sleepiness Scale was completed by the patient in clinic today and the sum total score was 6.     ASSESSMENT/PLAN:   Obstructive sleep apnea, symptoms are well controlled on CPAP.  Continue CPAP at 9 cm  We reminded him to replace his interface at least every 6 months to insure continued proper interface fit and seal.  We asked him to return to Sleep clinic in 12 months for follow up.     Jill Side, MD 06/02/2012,  8:54 AM

## 2012-07-29 ENCOUNTER — Other Ambulatory Visit (INDEPENDENT_AMBULATORY_CARE_PROVIDER_SITE_OTHER): Payer: Self-pay | Admitting: Family Medicine

## 2012-07-31 MED ORDER — DICLOFENAC SODIUM 75 MG TABLET,DELAYED RELEASE
75.0000 mg | DELAYED_RELEASE_TABLET | Freq: Two times a day (BID) | ORAL | Status: DC
Start: 2012-07-29 — End: 2013-05-23

## 2012-08-09 ENCOUNTER — Other Ambulatory Visit (INDEPENDENT_AMBULATORY_CARE_PROVIDER_SITE_OTHER): Payer: Self-pay | Admitting: Family Medicine

## 2012-08-10 MED ORDER — ATORVASTATIN 20 MG TABLET
20.0000 mg | ORAL_TABLET | Freq: Every evening | ORAL | Status: DC
Start: 2012-08-09 — End: 2013-08-21

## 2012-11-21 DIAGNOSIS — G894 Chronic pain syndrome: Secondary | ICD-10-CM | POA: Diagnosis not present

## 2012-12-13 ENCOUNTER — Other Ambulatory Visit (INDEPENDENT_AMBULATORY_CARE_PROVIDER_SITE_OTHER): Payer: Self-pay | Admitting: Family Medicine

## 2012-12-14 MED ORDER — METOPROLOL SUCCINATE ER 25 MG TABLET,EXTENDED RELEASE 24 HR
25.0000 mg | ORAL_TABLET | Freq: Every day | ORAL | Status: DC
Start: 2012-12-13 — End: 2013-09-21

## 2013-01-23 DIAGNOSIS — R079 Chest pain, unspecified: Secondary | ICD-10-CM | POA: Diagnosis not present

## 2013-01-24 ENCOUNTER — Other Ambulatory Visit (INDEPENDENT_AMBULATORY_CARE_PROVIDER_SITE_OTHER): Payer: Self-pay | Admitting: Family Medicine

## 2013-01-24 NOTE — Telephone Encounter (Signed)
Order for refill of dicyclomine pended and routed to Dr. Angelia Mould per patient request.    Thurston Pounds, RN

## 2013-04-03 ENCOUNTER — Other Ambulatory Visit (INDEPENDENT_AMBULATORY_CARE_PROVIDER_SITE_OTHER): Payer: Self-pay | Admitting: Family Medicine

## 2013-04-04 ENCOUNTER — Encounter (HOSPITAL_BASED_OUTPATIENT_CLINIC_OR_DEPARTMENT_OTHER): Payer: Self-pay | Admitting: Orthopaedic Surgery

## 2013-04-04 ENCOUNTER — Ambulatory Visit: Payer: BC Managed Care – PPO | Attending: Orthopaedic Surgery | Admitting: Orthopaedic Surgery

## 2013-04-04 ENCOUNTER — Encounter (INDEPENDENT_AMBULATORY_CARE_PROVIDER_SITE_OTHER): Payer: Self-pay

## 2013-04-04 ENCOUNTER — Ambulatory Visit (HOSPITAL_BASED_OUTPATIENT_CLINIC_OR_DEPARTMENT_OTHER): Payer: BC Managed Care – PPO

## 2013-04-04 VITALS — BP 150/85 | HR 91 | Temp 98.3°F | Ht 69.69 in | Wt 299.6 lb

## 2013-04-04 DIAGNOSIS — G4733 Obstructive sleep apnea (adult) (pediatric): Secondary | ICD-10-CM | POA: Insufficient documentation

## 2013-04-04 DIAGNOSIS — M25551 Pain in right hip: Secondary | ICD-10-CM

## 2013-04-04 DIAGNOSIS — E7800 Pure hypercholesterolemia, unspecified: Secondary | ICD-10-CM | POA: Insufficient documentation

## 2013-04-04 DIAGNOSIS — M1610 Unilateral primary osteoarthritis, unspecified hip: Secondary | ICD-10-CM | POA: Insufficient documentation

## 2013-04-04 DIAGNOSIS — M25552 Pain in left hip: Principal | ICD-10-CM

## 2013-04-04 DIAGNOSIS — M169 Osteoarthritis of hip, unspecified: Secondary | ICD-10-CM

## 2013-04-04 DIAGNOSIS — M25559 Pain in unspecified hip: Secondary | ICD-10-CM

## 2013-04-04 DIAGNOSIS — I10 Essential (primary) hypertension: Secondary | ICD-10-CM | POA: Insufficient documentation

## 2013-04-04 DIAGNOSIS — Z7982 Long term (current) use of aspirin: Secondary | ICD-10-CM | POA: Insufficient documentation

## 2013-04-04 MED ORDER — TRAMADOL 50 MG TABLET
50.00 mg | ORAL_TABLET | Freq: Four times a day (QID) | ORAL | Status: DC | PRN
Start: 2013-04-04 — End: 2013-07-04

## 2013-04-04 NOTE — H&P (Addendum)
[facility]   DEPARTMENT OF ORTHOPAEDICS  HISTORY AND PHYSICAL    CHIEF COMPLAINT    Bilateral hip pain     HISTORY OF PRESENT ILLNESS    [name] is a 58 year old gentleman referred by his primary care physician, Dr. Raleigh Callas, for evaluation of bilateral hip pain. Patient reports right hip pain of 6-7 year duration with a 1 week history of acute left hip pain. He describes bilateral gluteal, lateral hip and right groin pain that he describes as dull, burning and aching. He is concerned that he may have pulled a muscle in his right groin because he has difficulty lifting his right leg. He also describes lower back pain and tightness. He will occasional experience pain that radiates down the legs to his feet. He denies any history of sciatica or prior treatment. He does report a lower back injury many years ago for which he completed physical therapy. He denies any history of hip injections. He has failed over the counter NSAIDs. For pain, he currently takes Tylenol and Voltaren 75 mg daily and has done so for several years. At it's very worst, he rates his pain an 8/10.  His pain is not constant. It is worse with weightbearing activities, primarily walking and standing. His pain is better with rest. He reports associated symptoms of catching, clicking and popping, giving way, weakness, night time pain and joint stiffness of the right hip. His pain will sometimes awaken him at night.  He does not regularly use ambulatory aids, but does rely on a grocery cart while shopping. He is unable to walk more than 100 yards without pain. He reports difficulty with turning in bed and donning and doffing his socks and shoes. He reports limitations in both recreational and routine daily activities.  He also reports a recent 20 lb weight gain which he attributes to the inability to exercise secondary to hip pain.       MEDICATIONS    Current Outpatient Prescriptions   Medication Sig     ASPIR-81 81 mg TbEC qd    atorvastatin (LIPITOR) 20 mg Oral Tablet Take 1 Tab (20 mg total) by mouth Every evening    diclofenac sodium (VOLTAREN) 75 mg Oral Tablet, Delayed Release (E.C.) Take 1 Tab (75 mg total) by mouth Twice daily    dicyclomine (BENTYL) 20 mg Oral Tablet Take 1 Tab (20 mg total) by mouth Four times a day    GLUCOS CHOND CPLX ADVANCED ORAL qd    M-VIT ORAL qd    metoprolol succinate (TOPROL XL) 25 mg Oral Tablet Sustained Release 24 hr Take 1 Tab (25 mg total) by mouth Once a day    Omeprazole 20 mg Oral Tablet, Delayed Release (E.C.) take  by mouth.    traMADol (ULTRAM) 50 mg Oral Tablet Take 1 Tab (50 mg total) by mouth Every 6 hours as needed     ALLERGIES    Shellfish and rash with Sulfa.     PAST MEDICAL HISTORY    Hypertension, benign prostatic hypertrophy, obstructive sleep apnea on CPAP, osteoarthritis, hypercholesterolemia and history of left lower extremity superficial clot after trauma at the age of 36.     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 He is married  and lives with his wife in two-story home with 14 steps. He can live on the primary floor if necessary. He denies tobacco use, but admits to 1 alcoholic beverage weekly.     FAMILY HISTORY    Cancer and arthritis    REVIEW OF SYSTEMS    General: Negative for fevers, chills or night sweats  Cardiovascular: Negative for chest pain  Respiratory: Negative for shortness of breath  Genitourinary: Negative for incontinence  Musculoskeletal: Positive for symptoms mentioned in above HPI. Also, positive for bilateral shoulder pain and chronic lower back pain.   Integumentary: Negative for lesions, abrasions or open sores.    All other systems reviewed and negative.    PHYSICAL EXAMINATION     BP 150/85   Pulse 91   Temp(Src) 36.8 C (98.3 F)    Ht 1.77 m (5' 9.69")   Wt 135.9 kg (299 lb 9.7 oz)   BMI 43.38 kg/m2  GEN:   NAD. Alert and oriented to person, place and time.    NECK:   Full ROM without pain or neurologic changes.  Trachea midline.  CV:   Pulse normal rate and rhythm. PT and DP pulses present bilaterally.   PULM:   Normal respiratory effort.    ABD: Non distended. Abdominal obesity.   MS:   Right hip ROM is 85 degrees flexion with obligate external rotation, 0 degrees internal rotation, 15 degrees external rotation, 15 degrees abduction and 10 degrees adduction. Positive Stinchfield. Groin and gluteal pain reproducible with passive range of motion of hip. Patient slightly tender over lumbar spine. He ambulates with a trendelenburg gait.   NEURO:  Normal DTR's bilaterally. Femoral, peroneal and tibial nerve function intact.   PSYCHOSOCIAL:   Pleasant.  Normal affect.    SKIN:  No lesions, abrasions or open sores over right hip or exposed skin. Prominent left lower extremity varicosities to proximal left leg.      RADIOGRAPHS    Radiographs were performed and reviewed at this visit.    Bilateral hip x-rays interpreted by MD in clinic to show advanced right hip osteoarthritis with complete joint space loss, subchondral cysts and sclerosis.     ASSESSMENT     1. Advanced right hip arthritis  2. Hypertension  3. Obstructive sleep apnea on CPAP    PLAN    Nothing short of a total hip arthroplasty will improve his pain and overall function.  Hip injection and physical therapy would be futile given the advanced stage of his arthritis.  He has failed multiple years of conservative care and reports significant limitations with diminished quality of life. NSAIDs are no longer effectively controlling his pain. The risks, benefits and expectations regarding hip replacement were reviewed. He was provided a hip replacement playbook with additional information to review. He was explained that hip replacement surgery will predictably alleviate the groin pain and  the grinding that he is experiencing. He may have a component of his spine that is amplifying his pain and he understands that. He would like to proceed with surgery. An OR card for a right total hip arthroplasty was completed today. He will require medical clearance. Dental clearance is not necessary since he visited with his dentist last week for a cleaning and denies any problematic teeth. For pain, the patient was prescribed Tramadol 50 mg, 1 tab q6h prn for pain, dispense 120 tab with 0 refills. He will return following surgical clearance for a pre-operative history and physical visit. All of the patient  questions and concerns addressed.     Elyse Jarvis, PA-C 04/04/2013, 15:18     Cc:   Rosemarie Beath, MD  [address]  [facility] [id number]    End stage DJD right hip   Only total hip is able to improve pain and function  Will proceed when he is medically fit  Sees dentist regularly so no need for dental clearance  I personally saw and examined the patient. See physician's assistant note for additional details.     Jacqualine Code, MD

## 2013-04-06 ENCOUNTER — Ambulatory Visit: Payer: BC Managed Care – PPO | Attending: Family Medicine | Admitting: Family Medicine

## 2013-04-06 VITALS — BP 138/82 | HR 81 | Temp 97.6°F | Wt 297.8 lb

## 2013-04-06 DIAGNOSIS — Z7982 Long term (current) use of aspirin: Secondary | ICD-10-CM | POA: Insufficient documentation

## 2013-04-06 DIAGNOSIS — I10 Essential (primary) hypertension: Secondary | ICD-10-CM | POA: Insufficient documentation

## 2013-04-06 DIAGNOSIS — Z01818 Encounter for other preprocedural examination: Secondary | ICD-10-CM | POA: Insufficient documentation

## 2013-04-06 DIAGNOSIS — E785 Hyperlipidemia, unspecified: Secondary | ICD-10-CM | POA: Insufficient documentation

## 2013-04-06 DIAGNOSIS — I517 Cardiomegaly: Secondary | ICD-10-CM

## 2013-04-06 DIAGNOSIS — M25559 Pain in unspecified hip: Secondary | ICD-10-CM

## 2013-04-06 NOTE — Progress Notes (Signed)
04/06/13 1617   Urine test  (Siemens Multistix 10 SG)   Time collected 1617   Color Yellow   Clarity Clear   Glucose Negative   Bilirubin Negative   Ketones Negative   Specific Gravity 1.015   Blood (urine) Trace Hemolyzed   pH 5.0   Protein ! Trace   Urobilinogen Normal    Nitrite Negative   Leukocytes Negative   Bottle Number   (Siemens Multistix 10 SG) [id number]   Lot # [id number]   Expiration Date 07/16/13   Initials dthomas

## 2013-04-06 NOTE — Progress Notes (Signed)
Subjective:     Patient ID:  Henry [name] is an 58 y.o. male   Chief Complaint:    Chief Complaint   Patient presents with    Preop Exam       HPI Comments: 58 year old gentleman here for preoperative evaluation for right total hip arthroplasty requested by Dr. Collier Flowers. Twelve-lead EKG obtained in the clinic today revealed normal sinus rhythm normal EKG. A urine dip obtained in the clinic today was essentially unremarkable no culture was sent.    Current Outpatient Prescriptions:  ASPIR-81 81 mg TbEC, qd  atorvastatin (LIPITOR) 20 mg Oral Tablet, Take 1 Tab (20 mg total) by mouth Every evening  diclofenac sodium (VOLTAREN) 75 mg Oral Tablet, Delayed Release (E.C.), Take 1 Tab (75 mg total) by mouth Twice daily  dicyclomine (BENTYL) 20 mg Oral Tablet, Take 1 Tab (20 mg total) by mouth Four times a day  GLUCOS CHOND CPLX ADVANCED ORAL, qd  M-VIT ORAL, qd  metoprolol succinate (TOPROL XL) 25 mg Oral Tablet Sustained Release 24 hr, Take 1 Tab (25 mg total) by mouth Once a day  Omeprazole 20 mg Oral Tablet, Delayed Release (E.C.), take  by mouth.  traMADol (ULTRAM) 50 mg Oral Tablet, Take 1 Tab (50 mg total) by mouth Every 6 hours as needed      ALLERGIES:   -- Shellfish Containing Products     --  shrimp   -- Sulfa (Sulfonamides) -- Rash    Past Surgical History:    PB COLONOSCOPY,DIAGNOSTIC                                      HX APPENDECTOMY                                                PB LEG/ANKLE SURGERY PROC UNLISTED                           The patient has had no prior complications with anesthesia    Past Medical History:    HTN                                                           Hyperlipidemia                                                BPH                                             11/06/2005     Benign Prostatic Hypertropy                     09/22/2000          Review of Systems   Constitutional: Negative.    HENT:  The patient has a partial plate is fixed.   Respiratory: Negative.     Cardiovascular: Negative.    Gastrointestinal: Negative.    Musculoskeletal: Positive for joint pain.   Skin: Negative.    Neurological: Negative.    Endo/Heme/Allergies: Negative.    Psychiatric/Behavioral: Negative.      Objective:   Physical Exam   Constitutional: He is oriented to person, place, and time. He appears well-developed and well-nourished.   BP 138/82   Pulse 81   Temp(Src) 36.4 C (97.6 F) (Thermal Scan)   Wt 135.1 kg (297 lb 13.5 oz)   BMI 43.12 kg/m2       HENT:   Mouth/Throat: Oropharynx is clear and moist.   Eyes: Pupils are equal, round, and reactive to light.   Neck: Neck supple. No thyromegaly present.   Cardiovascular: Normal rate.    No murmur heard.  Pulm:  Effort normal and breath sounds normal.  Observations:  no respiratory distress.    Abdomen:   Bowel sounds are normal. He exhibits no mass. No tenderness.   Ortho/Musculoskeletal:   He exhibits no edema.   Neurological: He is alert and oriented to person, place, and time. He has normal reflexes.   Psychiatric: He has a normal mood and affect.     .    Assessment & Plan:     (V72.84) Pre-op exam  (primary encounter diagnosis)  Plan: ECG 12-LEAD ([facility] ONLY),         CBC/DIFF, COMPREHENSIVE METABOLIC PANEL,         NON-FASTING, XR CHEST PA AND LATERAL, POCT         URINE DIPSTICK, CANCELED: ECG 12-LEAD  EKG and urine dip in the clinic unremarkable. Provided laboratory data is acceptable and chest x-ray shows no pathology patient is at low risk for complications due to upcoming surgery.

## 2013-04-14 ENCOUNTER — Ambulatory Visit (HOSPITAL_BASED_OUTPATIENT_CLINIC_OR_DEPARTMENT_OTHER)
Admission: RE | Admit: 2013-04-14 | Discharge: 2013-04-14 | Disposition: A | Discharge status: Home / Self Care | Payer: BC Managed Care – PPO | Source: Ambulatory Visit | Attending: Family Medicine | Admitting: Family Medicine

## 2013-04-14 ENCOUNTER — Ambulatory Visit
Admission: RE | Admit: 2013-04-14 | Discharge: 2013-04-14 | Disposition: A | Discharge status: Home / Self Care | Payer: BC Managed Care – PPO | Source: Ambulatory Visit | Attending: Family Medicine | Admitting: Family Medicine

## 2013-04-14 DIAGNOSIS — Z01818 Encounter for other preprocedural examination: Secondary | ICD-10-CM

## 2013-04-14 LAB — CBC/DIFF
BASOPHILS: 1 %
BASOS ABS: 0.067 10*3/uL (ref 0.000–0.200)
BASOS ABS: 0.067 THOU/uL (ref 0.000–0.200)
EOS ABS: 0.392 10*3/uL (ref 0.000–0.500)
EOSINOPHIL: 4 %
HCT: 38.5 % (ref 36.7–47.0)
HGB: 12.2 g/dL — ABNORMAL LOW (ref 12.5–16.3)
LYMPHOCYTES: 34 %
LYMPHS ABS: 3.016 10*3/uL (ref 1.000–4.800)
MCH: 22.5 pg — ABNORMAL LOW (ref 27.4–33.0)
MCHC: 31.6 g/dL — ABNORMAL LOW (ref 32.5–35.8)
MCHC: 31.6 g/dL — ABNORMAL LOW (ref 32.5–35.8)
MCV: 71.2 fL — ABNORMAL LOW (ref 78–100)
MONOCYTES: 7 %
MONOS ABS: 0.625 THOU/uL (ref 0.300–1.000)
MPV: 6.9 fL — ABNORMAL LOW (ref 7.5–11.5)
MPV: 6.9 fL — ABNORMAL LOW (ref 7.5–11.5)
PLATELET COUNT: 329 10*3/uL (ref 140–450)
PMN ABS: 4.769 THOU/uL (ref 1.500–7.700)
PMN'S: 54 %
RBC: 5.42 MIL/uL (ref 4.06–5.63)
RDW: 15.7 % — ABNORMAL HIGH (ref 12.0–15.0)
WBC: 8.9 10*3/uL (ref 3.5–11.0)

## 2013-04-14 LAB — COMPREHENSIVE METABOLIC PANEL, NON-FASTING
ALBUMIN: 3.8 g/dL (ref 3.5–5.0)
ALKALINE PHOSPHATASE: 110 U/L (ref <150)
ALT (SGPT): 43 U/L (ref <55)
ANION GAP: 9 mmol/L (ref 4–13)
AST (SGOT): 25 U/L (ref 8–48)
BILIRUBIN, TOTAL: 0.4 mg/dL (ref 0.3–1.3)
BUN/CREAT RATIO: 15 (ref 6–22)
BUN: 13 mg/dL (ref 8–25)
CALCIUM: 9.4 mg/dL (ref 8.5–10.4)
CARBON DIOXIDE: 28 mmol/L (ref 22–32)
CHLORIDE: 104 mmol/L (ref 96–111)
CREATININE: 0.85 mg/dL (ref 0.62–1.27)
ESTIMATED GLOMERULAR FILTRATION RATE: >59 ml/min/1.73m2 (ref >59)
GLUCOSE,NONFAST: 100 mg/dL (ref 65–139)
POTASSIUM: 4.2 mmol/L (ref 3.5–5.1)
SODIUM: 141 mmol/L (ref 136–145)
TOTAL PROTEIN: 7.3 g/dL (ref 6.4–8.3)

## 2013-05-15 DIAGNOSIS — G894 Chronic pain syndrome: Secondary | ICD-10-CM | POA: Diagnosis not present

## 2013-05-15 DIAGNOSIS — E7800 Pure hypercholesterolemia, unspecified: Secondary | ICD-10-CM | POA: Diagnosis not present

## 2013-05-15 DIAGNOSIS — K5289 Other specified noninfective gastroenteritis and colitis: Secondary | ICD-10-CM | POA: Diagnosis not present

## 2013-05-16 ENCOUNTER — Encounter (FREE_STANDING_LABORATORY_FACILITY)
Admit: 2013-05-16 | Discharge: 2013-05-16 | Disposition: A | Discharge status: Home / Self Care | Payer: BC Managed Care – PPO | Attending: Orthopaedic Surgery | Admitting: Orthopaedic Surgery

## 2013-05-16 ENCOUNTER — Ambulatory Visit (HOSPITAL_BASED_OUTPATIENT_CLINIC_OR_DEPARTMENT_OTHER): Payer: BC Managed Care – PPO

## 2013-05-16 ENCOUNTER — Ambulatory Visit (HOSPITAL_BASED_OUTPATIENT_CLINIC_OR_DEPARTMENT_OTHER): Payer: BC Managed Care – PPO | Admitting: Orthopaedic Surgery

## 2013-05-16 ENCOUNTER — Ambulatory Visit (HOSPITAL_BASED_OUTPATIENT_CLINIC_OR_DEPARTMENT_OTHER): Payer: Self-pay | Admitting: Orthopaedic Surgery

## 2013-05-16 ENCOUNTER — Encounter (HOSPITAL_COMMUNITY): Payer: Self-pay

## 2013-05-16 ENCOUNTER — Ambulatory Visit
Admission: RE | Admit: 2013-05-16 | Discharge: 2013-05-16 | Disposition: A | Discharge status: Home / Self Care | Payer: BC Managed Care – PPO | Source: Ambulatory Visit | Attending: Orthopaedic Surgery | Admitting: Orthopaedic Surgery

## 2013-05-16 ENCOUNTER — Encounter (HOSPITAL_BASED_OUTPATIENT_CLINIC_OR_DEPARTMENT_OTHER): Payer: Self-pay | Admitting: Orthopaedic Surgery

## 2013-05-16 ENCOUNTER — Encounter (HOSPITAL_BASED_OUTPATIENT_CLINIC_OR_DEPARTMENT_OTHER): Payer: Self-pay

## 2013-05-16 VITALS — BP 160/95 | HR 76 | Temp 98.1°F | Ht 69.96 in | Wt 300.7 lb

## 2013-05-16 DIAGNOSIS — R109 Unspecified abdominal pain: Secondary | ICD-10-CM | POA: Insufficient documentation

## 2013-05-16 DIAGNOSIS — M1710 Unilateral primary osteoarthritis, unspecified knee: Secondary | ICD-10-CM

## 2013-05-16 DIAGNOSIS — R29898 Other symptoms and signs involving the musculoskeletal system: Secondary | ICD-10-CM | POA: Insufficient documentation

## 2013-05-16 DIAGNOSIS — Z01818 Encounter for other preprocedural examination: Principal | ICD-10-CM | POA: Insufficient documentation

## 2013-05-16 DIAGNOSIS — E7800 Pure hypercholesterolemia, unspecified: Secondary | ICD-10-CM | POA: Insufficient documentation

## 2013-05-16 DIAGNOSIS — Z8261 Family history of arthritis: Secondary | ICD-10-CM | POA: Insufficient documentation

## 2013-05-16 DIAGNOSIS — IMO0002 Reserved for concepts with insufficient information to code with codable children: Secondary | ICD-10-CM

## 2013-05-16 DIAGNOSIS — K219 Gastro-esophageal reflux disease without esophagitis: Secondary | ICD-10-CM | POA: Insufficient documentation

## 2013-05-16 DIAGNOSIS — I10 Essential (primary) hypertension: Secondary | ICD-10-CM | POA: Insufficient documentation

## 2013-05-16 DIAGNOSIS — M1610 Unilateral primary osteoarthritis, unspecified hip: Secondary | ICD-10-CM | POA: Insufficient documentation

## 2013-05-16 DIAGNOSIS — Z833 Family history of diabetes mellitus: Secondary | ICD-10-CM | POA: Insufficient documentation

## 2013-05-16 DIAGNOSIS — M179 Osteoarthritis of knee, unspecified: Secondary | ICD-10-CM

## 2013-05-16 DIAGNOSIS — G4733 Obstructive sleep apnea (adult) (pediatric): Secondary | ICD-10-CM | POA: Insufficient documentation

## 2013-05-16 DIAGNOSIS — Z8249 Family history of ischemic heart disease and other diseases of the circulatory system: Secondary | ICD-10-CM | POA: Insufficient documentation

## 2013-05-16 DIAGNOSIS — Z809 Family history of malignant neoplasm, unspecified: Secondary | ICD-10-CM | POA: Insufficient documentation

## 2013-05-16 DIAGNOSIS — Z9989 Dependence on other enabling machines and devices: Secondary | ICD-10-CM | POA: Insufficient documentation

## 2013-05-16 DIAGNOSIS — N400 Enlarged prostate without lower urinary tract symptoms: Secondary | ICD-10-CM | POA: Insufficient documentation

## 2013-05-16 DIAGNOSIS — M169 Osteoarthritis of hip, unspecified: Principal | ICD-10-CM | POA: Insufficient documentation

## 2013-05-16 DIAGNOSIS — G4730 Sleep apnea, unspecified: Secondary | ICD-10-CM | POA: Insufficient documentation

## 2013-05-16 HISTORY — DX: Sleep apnea, unspecified: G47.30

## 2013-05-16 HISTORY — DX: Heartburn: R12

## 2013-05-16 HISTORY — DX: Gastro-esophageal reflux disease without esophagitis: K21.9

## 2013-05-16 HISTORY — DX: Essential (primary) hypertension: I10

## 2013-05-16 HISTORY — DX: Dependence on other enabling machines and devices: Z99.89

## 2013-05-16 LAB — ABO/RH AND ANTIBODY SCREEN
ABO/RH(D): O POS
ANTIBODY SCREEN: NEGATIVE
ANTIBODY SCREEN: NEGATIVE

## 2013-05-16 LAB — CBC/DIFF
BASOPHILS: 1 %
BASOS ABS: 0.05 10*3/uL (ref 0.000–0.200)
EOS ABS: 0.3 THOU/uL (ref 0.000–0.500)
EOSINOPHIL: 4 %
HCT: 38.2 % (ref 36.7–47.0)
HGB: 11.8 g/dL — ABNORMAL LOW (ref 12.5–16.3)
LYMPHOCYTES: 31 %
LYMPHS ABS: 2.621 THOU/uL (ref 1.000–4.800)
MCH: 21.9 pg — ABNORMAL LOW (ref 27.4–33.0)
MCHC: 31 g/dL — ABNORMAL LOW (ref 32.5–35.8)
MCHC: 31 g/dL — ABNORMAL LOW (ref 32.5–35.8)
MCV: 70.7 fL — ABNORMAL LOW (ref 78–100)
MONOCYTES: 6 %
MONOS ABS: 0.476 10*3/uL (ref 0.300–1.000)
MPV: 7.5 fL (ref 7.5–11.5)
PLATELET COUNT: 327 THOU/uL (ref 140–450)
PMN ABS: 5.137 THOU/uL (ref 1.500–7.700)
PMN'S: 58 %
RBC: 5.41 MIL/uL (ref 4.06–5.63)
RDW: 16.3 % — ABNORMAL HIGH (ref 12.0–15.0)
WBC: 8.6 THOU/uL (ref 3.5–11.0)

## 2013-05-16 LAB — COMPREHENSIVE METABOLIC PANEL, NON-FASTING
ALBUMIN: 4 g/dL (ref 3.5–5.0)
ALKALINE PHOSPHATASE: 111 U/L (ref <150)
ALT (SGPT): 42 U/L (ref <55)
ANION GAP: 7 mmol/L (ref 4–13)
AST (SGOT): 30 U/L (ref 8–48)
BILIRUBIN, TOTAL: 0.3 mg/dL (ref 0.3–1.3)
BUN/CREAT RATIO: 14 (ref 6–22)
BUN: 11 mg/dL (ref 8–25)
CALCIUM: 9.2 mg/dL (ref 8.5–10.4)
CARBON DIOXIDE: 27 mmol/L (ref 22–32)
CARBON DIOXIDE: 27 mmol/L (ref 22–32)
CHLORIDE: 105 mmol/L (ref 96–111)
CREATININE: 0.81 mg/dL (ref 0.62–1.27)
ESTIMATED GLOMERULAR FILTRATION RATE: >59 mL/min/{1.73_m2} (ref >59)
GLUCOSE,NONFAST: 93 mg/dL (ref 65–139)
POTASSIUM: 4 mmol/L (ref 3.5–5.1)
SODIUM: 139 mmol/L (ref 136–145)
TOTAL PROTEIN: 7.5 g/dL (ref 6.4–8.3)
TOTAL PROTEIN: 7.5 g/dL (ref 6.4–8.3)

## 2013-05-16 NOTE — Anesthesia Preprocedure Evaluation (Addendum)
Physical Exam:     Airway       Mallampati: I    TM distance: >3 FB    Neck ROM: full  Mouth Opening: good.  Facial hair  Beard        Dental       Dentition intact             Pulmonary    Breath sounds clear to auscultation  (-) no rhonchi, no decreased breath sounds, no wheezes, no rales and no stridor     Cardiovascular    Rhythm: regular  Rate: Normal  (-) no friction rub and no murmur     Other findings            Anesthesia Plan:  Planned anesthesia type: spinal  ASA 3       Patient's NPO status is appropriate for Anesthesia.    Anesthetic plan and risks discussed with patient.    Anesthesia issues/risks discussed are: Aspiration, Cardiac Events/MI, Nerve Injuries, PONV, Stroke, Intraoperative Awareness/ Recall and Blood Loss.    Use of blood products discussed with patient whom.     Plan discussed with resident.                    EKG: Within last six months   04/06/2013  CXR: In [place]. 04/14/13. IMPRESSION: Negative chest examination.  Other Studies:   Labs in [employer]. 2013-05-22     Stress test: In [place]. 03/26/08.  Reviewer's Comments : Exercise ECG; Symptom negative, ECG negative; Normal heart rate responses; Exercise capacity for age is Good; Pretest Probability of CAD was Intermediate.  Post-Exercise Test Probability of CAD is Low. Estimated Annual Cardiovascular Mortality is Low.    Consults:   Pre-op H&P on chart per Dr Angelia Mould. 04/06/13. ".. Is at low risk for complications due to upcoming surgery."    STOP BANG Score (0-8): Known sleep apnea.    Patient instructed to take the following medications day of surgery: metoprolol, omeprazole  Patient last took aspirin on 2022/05/23 per surgeon instruction.  Patient instructed to hold diclofenac, glucosamine, mvi 7 days before surgery.    Patient provided anesthesia consent in PAT. Instructed to review consent prior to OR date. Educated that consent will be signed morning of surgery with anesthesiologist.

## 2013-05-16 NOTE — Progress Notes (Signed)
Education material reviewed.  The patient will be on an injectable anti-coagulant post-operatively for DVT prophylaxis

## 2013-05-16 NOTE — H&P (Addendum)
[facility]   DEPARTMENT OF ORTHOPAEDICS  HISTORY AND PHYSICAL    CHIEF COMPLAINT  Pre-op H&P Exam for Right THA    HISTORY OF PRESENT ILLNESS  Henry [name] is a 58 year old gentleman here today for a pre-operative history and physical for a right total hip arthroplasty scheduled for May 22, 2013. In February, he was referred by his primary care physician, Dr. Raleigh [name], for complaints of right hip pain of 6-7 year duration with a 1 week history of acute left hip pain. He describes bilateral gluteal, lateral hip and right groin pain that he describes as dull, burning and aching.  He also describes lower back pain and tightness. He will occasional experience pain that radiates down the legs to his feet. He denies any history of sciatica or prior treatment. He does report a lower back injury many years ago for which he completed physical therapy. He denies any history of hip injections. He has failed over the counter NSAIDs. For pain, he takes Tramadol and Voltaren 75 mg daily. At it's very worst, he rates his pain a 9/10. His pain is not constant. It is worse with weightbearing activities, primarily walking and standing. His pain is better with rest. He reports associated symptoms of catching, clicking and popping, giving way, weakness, night time pain and joint stiffness of the right hip. His pain will sometimes awaken him at night. He does not regularly use ambulatory aids, but does rely on a grocery cart while shopping. He is unable to walk more than 100 yards without pain. He reports difficulty with turning in bed and donning and doffing his socks and shoes. He reports limitations in both recreational and routine daily activities. He also reports a recent 20 lb weight gain which he attributes to the inability to exercise secondary to hip pain.   He denies any overall change in his health or medication changes since his last visit. He is feeling well and is ready to proceed  with surgery.    MEDICATIONS  Current Outpatient Prescriptions   Medication Sig    ASPIR-81 81 mg TbEC qd    atorvastatin (LIPITOR) 20 mg Oral Tablet Take 1 Tab (20 mg total) by mouth Every evening    diclofenac sodium (VOLTAREN) 75 mg Oral Tablet, Delayed Release (E.C.) Take 1 Tab (75 mg total) by mouth Twice daily    dicyclomine (BENTYL) 20 mg Oral Tablet Take 1 Tab (20 mg total) by mouth Four times a day    GLUCOS CHOND CPLX ADVANCED ORAL qd    M-VIT ORAL qd    metoprolol succinate (TOPROL XL) 25 mg Oral Tablet Sustained Release 24 hr Take 1 Tab (25 mg total) by mouth Once a day    metroNIDAZOLE (METROGEL) 1 % Gel with Pump by Apply externally route Once a day    Omeprazole 20 mg Oral Tablet, Delayed Release (E.C.) take  by mouth.    traMADol (ULTRAM) 50 mg Oral Tablet Take 1 Tab (50 mg total) by mouth Every 6 hours as needed       ALLERGIES  Allergies   Allergen Reactions    Shellfish Containing Products      shrimp    Sulfa (Sulfonamides) Rash       PAST MEDICAL HISTORY  Hypertension, benign prostatic hypertrophy, obstructive sleep apnea on CPAP, osteoarthritis, hypercholesterolemia and history of left lower extremity superficial clot after trauma at the age of 40.     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 He is married and lives with his wife in two-story home with 14 steps. He can live on the primary floor if necessary. He denies tobacco use, but admits to 1 alcoholic beverage weekly on average.     FAMILY HISTORY  Cancer, heart disease, diabetes and arthritis.    REVIEW OF SYSTEMS  Positive for symptoms as mentioned in the above HPI.  General: Negative for fevers, chills or night sweats   Cardiovascular: Negative for chest pain   Respiratory: Negative for shortness of breath   Genitourinary: Negative for  incontinence   Gastrointestinal: Positive for abdominal pain and cramping secondary to irritable bowel syndrome.  Musculoskeletal: Positive for bilateral shoulder pain and chronic lower back pain.   Integumentary: Negative for lesions, abrasions or open sores.   All other systems reviewed and negative.      PHYSICAL EXAMINATION   BP 160/95   Pulse 76   Temp(Src) 36.7 C (98.1 F)   Ht 1.777 m (5' 9.96")   Wt 136.4 kg (300 lb 11.3 oz)   BMI 43.2 kg/m2   SpO2 96%  GEN:   NAD. Alert and oriented to person, place and time.  PSYCHOSOCIAL:   Pleasant.  Normal affect.    HEENT:   Normocephalic/Atraumatic. Full neck ROM without pain or neurologic changes.  Trachea midline.  CV:   Pulse normal rate and rhythm. PT and DP pulses present bilaterally.   PULM:   Normal respiratory effort.    ABD:  Obese. Non distended.  MS:  Right hip ROM is 85 degrees flexion with obligate external rotation, 0 degrees internal rotation, 15 degrees external rotation, 20 degrees abduction and 10 degrees adduction. Positive Stinchfield. Groin and gluteal pain reproducible with passive range of motion of hip. He ambulates with a trendelenburg gait.   NEURO:  Normal peroneal, tibial and femoral nerve function.   SKIN/WOUND/INCISION:   Warm and pink. No lesions or abrasions over future surgical site or exposed skin. Prominent left lower extremity varicosities to proximal left leg.      RADIOGRAPHS  Radiographs were performed and reviewed at this visit.   Bilateral hip x-rays interpreted by MD in clinic to show advanced right hip osteoarthritis with complete joint space loss, subchondral cysts and sclerosis.     Hemogram   Lab Results   Component Value Date/Time    WBC 8.6 05/16/2013 10:06 AM    HGB 11.8* 05/16/2013 10:06 AM    HCT 38.2 05/16/2013 10:06 AM    PLTCNT 327 05/16/2013 10:06 AM    RBC 5.41 05/16/2013 10:06 AM    MCV 70.7* 05/16/2013 10:06 AM    MCHC 31.0* 05/16/2013 10:06 AM    MCH 21.9* 05/16/2013 10:06 AM    RDW 16.3* 05/16/2013 10:06 AM    MPV  7.5 05/16/2013 10:06 AM        Electrolytes   Lab Results   Component Value Date/Time    SODIUM 139 05/16/2013 10:06 AM    POTASSIUM 4.0 05/16/2013 10:06 AM    CHLORIDE 105 05/16/2013 10:06 AM    CO2 27 05/16/2013 10:06 AM    ANIONGAP 7 05/16/2013 10:06 AM          ASSESSMENT   1. Advanced right hip arthritis   2. Hypertension   3. Obstructive sleep apnea on  CPAP    PLAN  1.  Past Medical, Surgical and Family History were reviewed at today's visit.    2.  The patient is scheduled for a right total hip arthroplasty to be performed on May 22, 2013.  The patient will be discharged home on aspirin and stockings for DVT Prophylaxis.  The patient reports 14 steps into or at his home.  He is able to live on primary floor. He will require a FWW for ambulation upon hospital discharge.  I anticipate that he will be able to be discharged home with assistance from his wife.  3.  Medical records were reviewed at today's visit and the patient has received surgical clearance from primary care physician.  The risks and benefits of the surgery were discussed in detail with the patient at today's visit to include, but not limited to: infection which could necessitate implant removal or amputation, need for additional surgery, bleeding, dislocation, nerve or vessel damage, fractures, implant loosening, poly wear and anesthetic or medicals complications like DVT, pulmonary embolism, kidney failure, heart attack, stroke, pneumonia, sepsis, even death.  Surgical Consent was obtained and the Consent Form was signed and witnessed.  It is my belief that this was Informed Consent and the patient understood all key aspects of the operation.  4.  Pre-operative orders were placed to include pre-operative antibiotic of Ancef and tranexamic acid.    5.  The patient was also evaluated by the Pre Admission Testing NP today, who was given a packet containing the patient's medical clearance and consent form prior to his leaving the clinic today.  6.  A  pre-operative instruction sheet was issued. The patient was instructed to stop any OTC Supplements 14 days prior to surgery, ASA 7 days prior to surgery and NSAIDS 72 hours prior to surgery.  7.  The patient was instructed to call with any health changes or concerns prior to surgery.  All of the patient's questions were answered at this time and he understood and agreed to this plan.        Henry Jarvis, PA-C 05/16/2013, 10:26     Severe right hip DJD ready for right tha  I personally saw and examined the patient. See physician's assistant note for additional details.     Henry Code, MD      Cc:   Henry Beath, MD  [address] [place] [id number]

## 2013-05-16 NOTE — Progress Notes (Signed)
Total Joint History and Physical Teaching  HPI:   Donaldo Teegarden is a 58 y.o. male seen in [place] for pre op teaching leading up to R THA with Dr. Caryl Comes.    Date of Surg: 4.6.15                       Past Medical History   Diagnosis Date    HTN     Hyperlipidemia     BPH 11/06/2005    Benign Prostatic Hypertropy 09/22/2000                        Past Surgical History   Procedure Laterality Date    Pb colonoscopy,diagnostic      Hx appendectomy      Pb leg/ankle surgery proc unlisted       Social Hx:  reports that he has never smoked. He has never used smokeless tobacco. He reports that he drinks alcohol. He reports that he does not use illicit drugs.                     History   Smoking status    Never Smoker    Smokeless tobacco    Never Used     Comment: occ. cigar                      Pre operative Assessment:   Home environment:  Lives in a split entry house.  Enters at basement level with 1 step up.  Full bathroom and den on that level.  7 and 7 steps to main level with 1 handrail.  Walk in showers.      Social situation:  Lives with wife.  Works at [employer]:  crutches    Current Level of Function: antalgic gait    Patient Goals: walk with wife and go shopping    Pt reviewed Playbook for Total Joint Replacement with patient.   Discussed home set up and adaptations to meet post op needs.      Anticipated D/C Needs:   Equipment: FWW, AE   Recommendation: home with assistance    Therapist :     Judie Bonus, PT 05/16/2013 09:43  Pager #: [phone]

## 2013-05-21 MED ORDER — CEFAZOLIN 10 GRAM SOLUTION FOR INJECTION
3.00 g | Freq: Once | INTRAMUSCULAR | Status: AC
Start: 2013-05-22 — End: 2013-05-22
  Administered 2013-05-22: 2 g via INTRAVENOUS
  Administered 2013-05-22: 3 g via INTRAVENOUS
  Filled 2013-05-21: qty 30

## 2013-05-21 MED ORDER — TRANEXAMIC ACID 1,000 MG/10 ML (100 MG/ML) INTRAVENOUS SOLUTION
1000.0000 mg | Freq: Once | INTRAVENOUS | Status: AC
Start: 2013-05-22 — End: 2013-05-22
  Administered 2013-05-22: 1000 mg via INTRAVENOUS
  Filled 2013-05-21: qty 10

## 2013-05-21 MED ORDER — SODIUM CHLORIDE 0.9 % INTRAVENOUS SOLUTION
1000.00 mg | Freq: Once | INTRAVENOUS | Status: AC
Start: 2013-05-22 — End: 2013-05-22
  Administered 2013-05-22: 1000 mg via INTRAVENOUS
  Filled 2013-05-21: qty 10

## 2013-05-22 ENCOUNTER — Inpatient Hospital Stay (HOSPITAL_COMMUNITY): Payer: BC Managed Care – PPO | Admitting: Orthopaedic Surgery

## 2013-05-22 ENCOUNTER — Inpatient Hospital Stay (HOSPITAL_COMMUNITY): Payer: BC Managed Care – PPO

## 2013-05-22 ENCOUNTER — Inpatient Hospital Stay (HOSPITAL_COMMUNITY): Payer: BC Managed Care – PPO | Admitting: Family

## 2013-05-22 ENCOUNTER — Inpatient Hospital Stay (HOSPITAL_COMMUNITY)
Admission: RE | Admit: 2013-05-22 | Discharge: 2013-05-22 | Disposition: A | Discharge status: Home / Self Care | Payer: BC Managed Care – PPO | Source: Ambulatory Visit

## 2013-05-22 ENCOUNTER — Encounter (HOSPITAL_COMMUNITY)
Admission: RE | Disposition: A | Discharge status: Home / Self Care | Payer: Self-pay | Source: Ambulatory Visit | Attending: Orthopaedic Surgery

## 2013-05-22 ENCOUNTER — Encounter (HOSPITAL_COMMUNITY): Payer: Self-pay

## 2013-05-22 ENCOUNTER — Inpatient Hospital Stay
Admission: RE | Admit: 2013-05-22 | Discharge: 2013-05-23 | DRG: 470 | Disposition: A | Discharge status: Home / Self Care | Payer: BC Managed Care – PPO | Source: Ambulatory Visit | Attending: Orthopaedic Surgery | Admitting: Orthopaedic Surgery

## 2013-05-22 ENCOUNTER — Encounter (HOSPITAL_COMMUNITY): Payer: Self-pay | Admitting: Family

## 2013-05-22 DIAGNOSIS — M169 Osteoarthritis of hip, unspecified: Secondary | ICD-10-CM

## 2013-05-22 DIAGNOSIS — N400 Enlarged prostate without lower urinary tract symptoms: Secondary | ICD-10-CM | POA: Diagnosis present

## 2013-05-22 DIAGNOSIS — M1610 Unilateral primary osteoarthritis, unspecified hip: Principal | ICD-10-CM | POA: Diagnosis present

## 2013-05-22 DIAGNOSIS — K219 Gastro-esophageal reflux disease without esophagitis: Secondary | ICD-10-CM | POA: Diagnosis present

## 2013-05-22 DIAGNOSIS — Z6841 Body Mass Index (BMI) 40.0 and over, adult: Secondary | ICD-10-CM

## 2013-05-22 DIAGNOSIS — Z9889 Other specified postprocedural states: Secondary | ICD-10-CM

## 2013-05-22 DIAGNOSIS — G4733 Obstructive sleep apnea (adult) (pediatric): Secondary | ICD-10-CM | POA: Diagnosis present

## 2013-05-22 DIAGNOSIS — Z96649 Presence of unspecified artificial hip joint: Secondary | ICD-10-CM

## 2013-05-22 DIAGNOSIS — E669 Obesity, unspecified: Secondary | ICD-10-CM | POA: Diagnosis present

## 2013-05-22 DIAGNOSIS — Z7982 Long term (current) use of aspirin: Secondary | ICD-10-CM

## 2013-05-22 DIAGNOSIS — E785 Hyperlipidemia, unspecified: Secondary | ICD-10-CM | POA: Diagnosis present

## 2013-05-22 DIAGNOSIS — I10 Essential (primary) hypertension: Secondary | ICD-10-CM | POA: Diagnosis present

## 2013-05-22 DIAGNOSIS — D62 Acute posthemorrhagic anemia: Secondary | ICD-10-CM | POA: Diagnosis not present

## 2013-05-22 HISTORY — PX: HX HIP REPLACEMENT: SHX124

## 2013-05-22 LAB — TYPE AND CROSS RED CELLS - UNITS
ABO/RH(D): O POS
ANTIBODY SCREEN: NEGATIVE
UNITS ORDERED: 2

## 2013-05-22 SURGERY — ARTHROPLASTY HIP TOTAL
Anesthesia: Spinal | Site: Hip | Laterality: Right | Wound class: Clean Wound: Uninfected operative wounds in which no inflammation occurred

## 2013-05-22 MED ORDER — HYDROMORPHONE 1 MG/ML INJECTION WRAPPER
0.40 mg | INJECTION | INTRAMUSCULAR | Status: DC | PRN
Start: 2013-05-22 — End: 2013-05-22

## 2013-05-22 MED ORDER — OXYCODONE 5 MG TABLET
10.0000 mg | ORAL_TABLET | ORAL | Status: DC | PRN
Start: 2013-05-22 — End: 2013-05-23
  Administered 2013-05-23: 10 mg via ORAL
  Filled 2013-05-22: qty 2

## 2013-05-22 MED ORDER — DIPHENHYDRAMINE 50 MG/ML INJECTION SOLUTION
12.50 mg | INTRAMUSCULAR | Status: DC | PRN
Start: 2013-05-22 — End: 2013-05-23

## 2013-05-22 MED ORDER — GELATIN SPONGE,ABSORBABLE-PORCINE SKIN 100 CM TOPICAL SPONGE
1.00 | VAGINAL_SPONGE | Freq: Once | CUTANEOUS | Status: DC | PRN
Start: 2013-05-22 — End: 2013-05-22
  Administered 2013-05-22: 1 via TOPICAL

## 2013-05-22 MED ORDER — BENZOCAINE-MENTHOL SORE THROAT LOZENGE WRAPPER
1.0000 | LOZENGE | Status: DC | PRN
Start: 2013-05-22 — End: 2013-05-23
  Filled 2013-05-22: qty 3

## 2013-05-22 MED ORDER — SODIUM CHLORIDE 0.9 % INTRAVENOUS SOLUTION
INTRAVENOUS | Status: DC
Start: 2013-05-22 — End: 2013-05-23
  Administered 2013-05-23: 0 via INTRAVENOUS

## 2013-05-22 MED ORDER — SODIUM CHLORIDE 0.9 % (FLUSH) INJECTION SYRINGE
2.00 mL | INJECTION | Freq: Three times a day (TID) | INTRAMUSCULAR | Status: DC
Start: 2013-05-22 — End: 2013-05-22

## 2013-05-22 MED ORDER — METRONIDAZOLE 1 % TOPICAL GEL WITH PUMP
1.0000 | Freq: Every day | CUTANEOUS | Status: DC
Start: 2013-05-23 — End: 2013-05-23
  Administered 2013-05-23: 0 via CUTANEOUS

## 2013-05-22 MED ORDER — DIPHENHYDRAMINE 25 MG CAPSULE
25.0000 mg | ORAL_CAPSULE | Freq: Four times a day (QID) | ORAL | Status: DC | PRN
Start: 2013-05-22 — End: 2013-05-23

## 2013-05-22 MED ORDER — FENTANYL (PF) 50 MCG/ML INJECTION SOLUTION
Freq: Once | INTRAMUSCULAR | Status: DC | PRN
Start: 2013-05-22 — End: 2013-05-22
  Administered 2013-05-22: 100 ug via INTRAVENOUS

## 2013-05-22 MED ORDER — MORPHINE 2 MG/ML INTRAVENOUS CARTRIDGE
2.0000 mg | CARTRIDGE | INTRAVENOUS | Status: DC | PRN
Start: 2013-05-22 — End: 2013-05-23

## 2013-05-22 MED ORDER — MELOXICAM 7.5 MG TABLET
7.5000 mg | ORAL_TABLET | Freq: Every day | ORAL | Status: DC
Start: 2013-05-22 — End: 2013-07-04

## 2013-05-22 MED ORDER — PROCHLORPERAZINE EDISYLATE 10 MG/2 ML (5 MG/ML) INJECTION SOLUTION
5.00 mg | Freq: Once | INTRAMUSCULAR | Status: DC | PRN
Start: 2013-05-22 — End: 2013-05-22

## 2013-05-22 MED ORDER — SODIUM PHOSPHATES 19 GRAM-7 GRAM/118 ML ENEMA
133.0000 mL | ENEMA | Freq: Every day | RECTAL | Status: DC | PRN
Start: 2013-05-22 — End: 2013-05-23

## 2013-05-22 MED ORDER — ONDANSETRON HCL (PF) 4 MG/2 ML INJECTION SOLUTION
Freq: Once | INTRAMUSCULAR | Status: DC | PRN
Start: 2013-05-22 — End: 2013-05-22
  Administered 2013-05-22: 4 mg via INTRAVENOUS

## 2013-05-22 MED ORDER — PHENYLEPHRINE 60MG/250ML IN NS INFUSION
INTRAVENOUS | Status: DC | PRN
Start: 2013-05-22 — End: 2013-05-22
  Administered 2013-05-22: 0.5 ug/kg/min via INTRAVENOUS
  Administered 2013-05-22: 0.3 ug/kg/min via INTRAVENOUS
  Administered 2013-05-22: 0.5 ug/kg/min via INTRAVENOUS
  Administered 2013-05-22 (×2): .4 ug/kg/min via INTRAVENOUS

## 2013-05-22 MED ORDER — LACTATED RINGERS INTRAVENOUS SOLUTION
INTRAVENOUS | Status: DC | PRN
Start: 2013-05-22 — End: 2013-05-22

## 2013-05-22 MED ORDER — OXYCODONE 5 MG TABLET
5.0000 mg | ORAL_TABLET | ORAL | Status: DC | PRN
Start: 2013-05-22 — End: 2013-05-23
  Administered 2013-05-22 – 2013-05-23 (×2): 5 mg via ORAL
  Filled 2013-05-22 (×2): qty 1

## 2013-05-22 MED ORDER — MIDAZOLAM 1 MG/ML INJECTION SOLUTION
Freq: Once | INTRAMUSCULAR | Status: DC | PRN
Start: 2013-05-22 — End: 2013-05-22
  Administered 2013-05-22: 2 mg via INTRAVENOUS

## 2013-05-22 MED ORDER — LACTATED RINGERS INTRAVENOUS SOLUTION
INTRAVENOUS | Status: DC
Start: 2013-05-22 — End: 2013-05-22

## 2013-05-22 MED ORDER — HYDROCODONE 7.5 MG-ACETAMINOPHEN 325 MG TABLET
1.00 | ORAL_TABLET | ORAL | Status: DC | PRN
Start: 2013-05-22 — End: 2013-06-06

## 2013-05-22 MED ORDER — ENOXAPARIN 40 MG/0.4 ML SUBCUTANEOUS SYRINGE
40.0000 mg | INJECTION | Freq: Every day | SUBCUTANEOUS | Status: DC
Start: 2013-05-22 — End: 2013-05-23

## 2013-05-22 MED ORDER — ONDANSETRON HCL (PF) 4 MG/2 ML INJECTION SOLUTION
4.00 mg | Freq: Once | INTRAMUSCULAR | Status: DC | PRN
Start: 2013-05-22 — End: 2013-05-22

## 2013-05-22 MED ORDER — ASPIRIN 81 MG CHEWABLE TABLET
81.0000 mg | CHEWABLE_TABLET | Freq: Every day | ORAL | Status: DC
Start: 2013-05-22 — End: 2013-05-23
  Administered 2013-05-22: 0 mg via ORAL
  Administered 2013-05-23: 81 mg via ORAL
  Filled 2013-05-22: qty 1

## 2013-05-22 MED ORDER — PANTOPRAZOLE 40 MG TABLET,DELAYED RELEASE
40.00 mg | DELAYED_RELEASE_TABLET | Freq: Every morning | ORAL | Status: DC
Start: 2013-05-23 — End: 2013-05-23
  Administered 2013-05-23: 40 mg via ORAL
  Filled 2013-05-22: qty 1

## 2013-05-22 MED ORDER — MORPHINE 2 MG/ML INTRAVENOUS CARTRIDGE
4.0000 mg | CARTRIDGE | INTRAVENOUS | Status: DC | PRN
Start: 2013-05-22 — End: 2013-05-23

## 2013-05-22 MED ORDER — ONDANSETRON HCL 4 MG TABLET
4.00 mg | ORAL_TABLET | Freq: Three times a day (TID) | ORAL | Status: DC | PRN
Start: 2013-05-22 — End: 2013-06-06

## 2013-05-22 MED ORDER — ALBUMIN, HUMAN 5 % INTRAVENOUS SOLUTION
INTRAVENOUS | Status: DC | PRN
Start: 2013-05-22 — End: 2013-05-22

## 2013-05-22 MED ORDER — ONDANSETRON HCL 4 MG TABLET
4.0000 mg | ORAL_TABLET | ORAL | Status: DC | PRN
Start: 2013-05-22 — End: 2013-05-23
  Filled 2013-05-22 (×2): qty 1

## 2013-05-22 MED ORDER — PROPOFOL 10 MG/ML INTRAVENOUS EMULSION
INTRAVENOUS | Status: DC | PRN
Start: 2013-05-22 — End: 2013-05-22
  Administered 2013-05-22: 60 ug/kg/min via INTRAVENOUS
  Administered 2013-05-22: 150 ug/kg/min via INTRAVENOUS
  Administered 2013-05-22: 120 ug/kg/min via INTRAVENOUS
  Administered 2013-05-22: 55 ug/kg/min via INTRAVENOUS
  Administered 2013-05-22: 45 ug/kg/min via INTRAVENOUS
  Administered 2013-05-22: 110 ug/kg/min via INTRAVENOUS
  Administered 2013-05-22: 80 ug/kg/min via INTRAVENOUS
  Administered 2013-05-22: 65 ug/kg/min via INTRAVENOUS
  Administered 2013-05-22: 100 ug/kg/min via INTRAVENOUS
  Administered 2013-05-22: 13:00:00 55 ug/kg/min via INTRAVENOUS
  Administered 2013-05-22: 70 ug/kg/min via INTRAVENOUS
  Administered 2013-05-22: 50 ug/kg/min via INTRAVENOUS

## 2013-05-22 MED ORDER — CEFAZOLIN 10 GRAM SOLUTION FOR INJECTION
2.00 g | Freq: Three times a day (TID) | INTRAMUSCULAR | Status: AC
Start: 2013-05-22 — End: 2013-05-23
  Administered 2013-05-22: 2 g via INTRAVENOUS
  Administered 2013-05-22: 0 g via INTRAVENOUS
  Administered 2013-05-23: 2 g via INTRAVENOUS
  Filled 2013-05-22 (×2): qty 20

## 2013-05-22 MED ORDER — ATORVASTATIN 10 MG TABLET
20.00 mg | ORAL_TABLET | Freq: Every evening | ORAL | Status: DC
Start: 2013-05-22 — End: 2013-05-23
  Administered 2013-05-22: 20 mg via ORAL
  Filled 2013-05-22 (×2): qty 2

## 2013-05-22 MED ORDER — GELATIN SPONGE,ABSORBABLE-PORCINE SKIN 100 CM TOPICAL SPONGE
VAGINAL_SPONGE | CUTANEOUS | Status: AC
Start: 2013-05-22 — End: 2013-05-22
  Filled 2013-05-22: qty 2

## 2013-05-22 MED ORDER — SODIUM CHLORIDE 0.9 % IRRIGATION SOLUTION
1000.00 mL | Status: DC | PRN
Start: 2013-05-22 — End: 2013-05-22
  Administered 2013-05-22: 3000 mL

## 2013-05-22 MED ORDER — PHENYLEPHRINE 0.5 MG/5 ML (100 MCG/ML)IN 0.9 % SOD.CHLORIDE IV SYRINGE
INJECTION | Freq: Once | INTRAVENOUS | Status: DC | PRN
Start: 2013-05-22 — End: 2013-05-22
  Administered 2013-05-22: 120 ug via INTRAVENOUS
  Administered 2013-05-22 (×2): 50 ug via INTRAVENOUS
  Administered 2013-05-22: 100 ug via INTRAVENOUS
  Administered 2013-05-22: 120 ug via INTRAVENOUS

## 2013-05-22 MED ORDER — KETOROLAC 15 MG/ML INJECTION SOLUTION
15.0000 mg | Freq: Four times a day (QID) | INTRAMUSCULAR | Status: DC | PRN
Start: 2013-05-22 — End: 2013-05-23
  Administered 2013-05-22: 15 mg via INTRAVENOUS
  Filled 2013-05-22: qty 1

## 2013-05-22 MED ORDER — DIPHENHYDRAMINE 50 MG/ML INJECTION SOLUTION
25.0000 mg | Freq: Four times a day (QID) | INTRAMUSCULAR | Status: DC | PRN
Start: 2013-05-22 — End: 2013-05-23

## 2013-05-22 MED ORDER — PROCHLORPERAZINE EDISYLATE 10 MG/2 ML (5 MG/ML) INJECTION SOLUTION
5.00 mg | Freq: Once | INTRAMUSCULAR | Status: DC | PRN
Start: 2013-05-22 — End: 2013-05-23

## 2013-05-22 MED ORDER — SODIUM CHLORIDE 0.9 % (FLUSH) INJECTION SYRINGE
2.0000 mL | INJECTION | INTRAMUSCULAR | Status: DC | PRN
Start: 2013-05-22 — End: 2013-05-23

## 2013-05-22 MED ORDER — NALBUPHINE 10 MG/ML INJECTION SOLUTION
10.00 mg | INTRAMUSCULAR | Status: DC | PRN
Start: 2013-05-22 — End: 2013-05-23
  Filled 2013-05-22: qty 1

## 2013-05-22 MED ORDER — SODIUM CHLORIDE 0.9 % (FLUSH) INJECTION SYRINGE
2.0000 mL | INJECTION | Freq: Three times a day (TID) | INTRAMUSCULAR | Status: DC
Start: 2013-05-22 — End: 2013-05-23
  Administered 2013-05-22: 2 mL
  Administered 2013-05-22 – 2013-05-23 (×2): 0 mL

## 2013-05-22 MED ORDER — PROMETHAZINE 25 MG TABLET
25.0000 mg | ORAL_TABLET | Freq: Four times a day (QID) | ORAL | Status: DC | PRN
Start: 2013-05-22 — End: 2013-05-23

## 2013-05-22 MED ORDER — BUPIVACAINE (PF) 0.75 % (7.5 MG/ML) INJECTION SOLUTION
Freq: Once | INTRAMUSCULAR | Status: DC | PRN
Start: 2013-05-22 — End: 2013-05-22
  Administered 2013-05-22: 2 mL

## 2013-05-22 MED ORDER — ACETAMINOPHEN 325 MG TABLET
650.0000 mg | ORAL_TABLET | ORAL | Status: DC | PRN
Start: 2013-05-22 — End: 2013-05-23

## 2013-05-22 MED ORDER — MORPHINE (PF) 1 MG/ML INJECTION SOLUTION
0.20 mg | Freq: Once | INTRAMUSCULAR | Status: DC
Start: 2013-05-22 — End: 2013-05-22
  Filled 2013-05-22: qty 0.2

## 2013-05-22 MED ORDER — DICYCLOMINE 10 MG CAPSULE
20.00 mg | ORAL_CAPSULE | Freq: Four times a day (QID) | ORAL | Status: DC
Start: 2013-05-22 — End: 2013-05-23
  Administered 2013-05-22 – 2013-05-23 (×3): 20 mg via ORAL
  Filled 2013-05-22 (×6): qty 2

## 2013-05-22 MED ORDER — ENOXAPARIN 40 MG/0.4 ML SUBCUTANEOUS SYRINGE
40.0000 mg | INJECTION | SUBCUTANEOUS | Status: DC
Start: 2013-05-23 — End: 2013-05-23
  Administered 2013-05-23: 40 mg via SUBCUTANEOUS
  Filled 2013-05-22: qty 0.4

## 2013-05-22 MED ORDER — FAMOTIDINE 20 MG TABLET
20.0000 mg | ORAL_TABLET | Freq: Every day | ORAL | Status: DC
Start: 2013-05-22 — End: 2013-05-23
  Administered 2013-05-22: 0 mg via ORAL
  Administered 2013-05-23: 20 mg via ORAL
  Filled 2013-05-22 (×2): qty 1

## 2013-05-22 MED ORDER — SIMETHICONE 80 MG CHEWABLE TABLET
80.0000 mg | CHEWABLE_TABLET | Freq: Three times a day (TID) | ORAL | Status: DC | PRN
Start: 2013-05-22 — End: 2013-05-23
  Filled 2013-05-22 (×2): qty 1

## 2013-05-22 MED ORDER — METOPROLOL SUCCINATE ER 25 MG TABLET,EXTENDED RELEASE 24 HR
25.0000 mg | ORAL_TABLET | Freq: Every day | ORAL | Status: DC
Start: 2013-05-22 — End: 2013-05-23
  Administered 2013-05-22 (×2): 0 mg via ORAL
  Administered 2013-05-23: 25 mg via ORAL
  Filled 2013-05-22 (×2): qty 1

## 2013-05-22 MED ORDER — SENNOSIDES 8.6 MG-DOCUSATE SODIUM 50 MG TABLET
2.0000 | ORAL_TABLET | Freq: Two times a day (BID) | ORAL | Status: DC
Start: 2013-05-22 — End: 2013-05-23
  Administered 2013-05-22 – 2013-05-23 (×2): 2 via ORAL
  Filled 2013-05-22 (×3): qty 2

## 2013-05-22 MED ORDER — SODIUM CHLORIDE 0.9 % (FLUSH) INJECTION SYRINGE
2.00 mL | INJECTION | INTRAMUSCULAR | Status: DC | PRN
Start: 2013-05-22 — End: 2013-05-22

## 2013-05-22 MED ORDER — MORPHINE (PF) 1 MG/ML INJECTION SOLUTION
Freq: Once | INTRAMUSCULAR | Status: DC | PRN
Start: 2013-05-22 — End: 2013-05-22
  Administered 2013-05-22: 0.2 mg via INTRATHECAL

## 2013-05-22 MED ORDER — BISACODYL 10 MG RECTAL SUPPOSITORY
10.0000 mg | Freq: Two times a day (BID) | RECTAL | Status: DC | PRN
Start: 2013-05-22 — End: 2013-05-23

## 2013-05-22 MED ORDER — ONDANSETRON HCL (PF) 4 MG/2 ML INJECTION SOLUTION
4.00 mg | Freq: Three times a day (TID) | INTRAMUSCULAR | Status: DC
Start: 2013-05-22 — End: 2013-05-23
  Administered 2013-05-22: 0 mg via INTRAVENOUS
  Administered 2013-05-22: 4 mg via INTRAVENOUS
  Administered 2013-05-23: 0 mg via INTRAVENOUS
  Filled 2013-05-22: qty 2

## 2013-05-22 MED ORDER — ZOLPIDEM 5 MG TABLET
5.0000 mg | ORAL_TABLET | Freq: Every evening | ORAL | Status: DC | PRN
Start: 2013-05-22 — End: 2013-05-23

## 2013-05-22 MED ORDER — SENNOSIDES 8.6 MG TABLET
8.60 mg | ORAL_TABLET | Freq: Two times a day (BID) | ORAL | Status: DC
Start: 2013-05-22 — End: 2013-07-04

## 2013-05-22 MED ORDER — ONDANSETRON HCL (PF) 4 MG/2 ML INJECTION SOLUTION
4.0000 mg | INTRAMUSCULAR | Status: DC | PRN
Start: 2013-05-22 — End: 2013-05-23

## 2013-05-22 MED ORDER — EPHEDRINE SULFATE 50 MG/ML INJECTION SOLUTION
Freq: Once | INTRAMUSCULAR | Status: DC | PRN
Start: 2013-05-22 — End: 2013-05-22
  Administered 2013-05-22 (×2): 10 mg via INTRAVENOUS
  Administered 2013-05-22: 5 mg via INTRAVENOUS
  Administered 2013-05-22: 10 mg via INTRAVENOUS

## 2013-05-22 MED ORDER — MAGNESIUM HYDROXIDE 400 MG/5 ML ORAL SUSPENSION
30.0000 mL | Freq: Two times a day (BID) | ORAL | Status: DC | PRN
Start: 2013-05-22 — End: 2013-05-23

## 2013-05-22 MED ADMIN — potassium chloride 20 mEq/L in dextrose 5 %-0.45 % sodium chloride IV: INTRAVENOUS | @ 12:00:00 | NDC 00338067104

## 2013-05-22 MED ADMIN — lactated Ringers intravenous solution: INTRAVENOUS | @ 22:00:00 | NDC 00338011704

## 2013-05-22 MED ADMIN — sodium chloride 0.9 % intravenous solution: INTRAVENOUS | @ 13:00:00 | NDC 00338004904

## 2013-05-22 SURGICAL SUPPLY — 67 items
BLADE SAW 3.543X.709IN 2 CUT SGTL THK.054IN STRL (SURGICAL CUTTING SUPPLIES) ×1 IMPLANT
BLADE SAW 3.543X.709IN 2 CUT S_GTL THK.054IN STRL (CUTTING ELEMENTS) ×1
BLADE SAW 77.6X11.2MM RECIPROCATE OFST HVDTY SS THK.77MM LONG STRL LF  DISP (SURGICAL CUTTING SUPPLIES) ×1 IMPLANT
BLADE SAW 77.6X11.2X.77MM RECI_PROCATE HVDTY SS LONG OFST (CUTTING ELEMENTS) ×1
BLANKET 3M BAIR HUG ADLT UPR B ODY 74X24IN PLMR 2 INCS ADH (MISCELLANEOUS PT CARE ITEMS) ×2 IMPLANT
COVER 48IN 2 TIER PAD HVDTY BCK STRL BLU EQP (EQUIPMENT MINOR) ×1 IMPLANT
COVER POSITION WHT HIPGRIP FBRC SET UPRT (DRAPE/PACKS/SHEETS/OR TOWEL) ×1 IMPLANT
COVERSET HIPGRIP_D030051 25SETS/CS (DRAPE/PACKS/SHEETS/OR TOWEL) ×1
DEVICE SECURE STATLK FOLEY ANCH PAD STAB TRCT SIL CATH ADULT STRL LF  DISP (UROLOGICAL SUPPLIES) IMPLANT
DEVICE SECURE STATLK FOLEY ANC_H PAD STAB TRCT SIL CATH ADLT (UROLOGICAL SUPPLIES)
DISC USE ITEM 163322 - SYRINGE LL 20ML LTX STRL MED (NEEDLES & SYRINGE SUPPLIES) ×1 IMPLANT
DISCONTINUED USE ITEM 102436 - NEEDLE HYPO 22GA 1.5IN REG WL_BD POLYPROP REG BVL LL HUB (NEEDLES & SYRINGE SUPPLIES) IMPLANT
DONUT EXTREMITY CUSHIONING 31143137 (POSITIONING PRODUCTS) ×2 IMPLANT
DRAPE 4IN BIG CA BCK TBL STD E_QP (EQUIPMENT MINOR) ×1
DRAPE BACKTABLE 72IN 10/CS 37501 (Other Miscellaneous) ×2 IMPLANT
DRAPE PCH HVDTY HIP SURG TBRN (PROTECTIVE PRODUCTS/GARMENTS) ×2 IMPLANT
DRESSING ABDOM 8 X 10IN 7198D 216/CS (WOUND CARE SUPPLY) IMPLANT
DRESSING ABDOM 8 X 10IN 7198D 216/CS (WOUND CARE/ENTEROSTOMAL SUPPLY)
DRESSING TRNSPR FILM 8 X 12IN_1629 TEGADERM LF STRL 10/BX (WOUND CARE SUPPLY) ×2 IMPLANT
DRESSING TRNSPR FILM 8 X 12IN_1629 TEGADERM LF STRL 10/BX (WOUND CARE/ENTEROSTOMAL SUPPLY) ×2
DRESSING XEROFORM 5 X 9IN 8884433605 50/BX 4BX/CS (WOUND CARE SUPPLY) IMPLANT
DRESSING XEROFORM 5 X 9IN 8884433605 50/BX 4BX/CS (WOUND CARE/ENTEROSTOMAL SUPPLY)
DURAPREP 26ML 8630 CS/20 (SSOP) ×2
ELECTRODE ESURG BLADE 4IN 3/32IN EDGE STRL 1.1IN DISP STD SHAFT XTD LF (CAUTERY SUPPLIES) ×1 IMPLANT
ELECTRODE ESURG BLADE 4IN 3/32_IN EDGE STRL DISP XTD BEND LF (CAUTERY SUPPLIES) ×1
GOWN SURG XL XLNG AAMI L4 IMPR_V RGLN SLEEVE A LINE STRL LF (DGOW) ×1
GOWN SURG XL XLNG L4 RGLN SLEEVE BRTHBL A LINE STRL LF  DISP SMARTGOWN (DGOW) ×1 IMPLANT
HEAD ECHLN 36MM +0MM 12/14 TAPER HIP OXNM FEM STRL LF ×1 IMPLANT
HIP POR FEM OX HD R3 SHELL_R3 XLPE LN 71703318 (JOINT COMMPONENT) ×1 IMPLANT
HOOD SRG SHLD T5 XL PLWY FACE SHLD STRL LF DISP (HDCV) ×8 IMPLANT
KIT DRAIN 10FR PVC 3 SPRG RESERVOIR TROCAR Y CONN JP 1/8IN RND 400ML STRL LF  DISP (WOUND CARE SUPPLY) ×1 IMPLANT
KIT PLS LAV PLSVC + FAN SPRAY STRL LF  DISP (IRR) ×1 IMPLANT
KIT PLS LAV PLSVC + FAN SPRAY_STRL LF DISP (IRR) ×1
KIT RM TURNOVER CLEANOP CSTM INFCT CONTROL (KITS & TRAYS (DISPOSABLE)) ×1
KIT RM TURNOVER CLEANOP CSTM I_NFCT CONTROL (KITS & TRAYS (DISPOSABLE)) ×1
KIT RM TURNOVER CLEANOP CUSTOM INFCT CONTROL (KITS & TRAYS (DISPOSABLE)) ×1 IMPLANT
KIT WND DRAIN 10FR_12/CS SU130402D (WOUND CARE/ENTEROSTOMAL SUPPLY) ×1
LABEL E-Z STICK_STLEZP1 100EA/CS (LABELS/CHART SUPPLIES) ×1
LABEL MED EZ PEEL MRKR LF (LABELS/CHART SUPPLIES) ×1 IMPLANT
LINER ACET R3 0D 58MM 36MM XLPE HIP STRL ×1 IMPLANT
NEEDLE HYPO 22GA 1.5IN REG WL_BD POLYPROP REG BVL LL HUB (NEEDLES & SYRINGE SUPPLIES)
PAD ARMBOARD FOAM BLU_FP-ECARM (POSITIONING PRODUCTS) ×2
PAD ARMBRD BLU (POSITIONING PRODUCTS) ×2 IMPLANT
PEN SURG MRKNG WRITESITE + RLR LBL SET 3X DARKER FORMULATE GNTN VIOL INK STRL LF  CHLRPRP (MISCELLANEOUS PT CARE ITEMS) ×1 IMPLANT
PEN SURG MRKNG WRITESITE + SKN_RLR LBL SET 3X DARKER (MISCELLANEOUS PT CARE ITEMS) ×1
SHELL ACET 58MM HIP 3 H POLY POR R3 STD STRL ×1 IMPLANT
SOL IRRG 0.9% NACL 1000ML PLASTIC PR BTL ISTNC N-PYRG STRL LF (SOLUTIONS) ×3 IMPLANT
SOL IRRG 0.9% NACL 3L PLASTIC CONTAINR UROMATIC LF (SOLUTIONS) IMPLANT
SOL SURG PREP 26ML DRPRP 74% ISPRP 0.7% IOD POVACRYLEX SLF CNTN APPL SKIN STRL PREOP (SSOP) ×2 IMPLANT
SOLUTION IRRG NS 2F7124 1000CC_12/CS (SOLUTIONS) ×3
SOLUTION IRRG NS 3000CC 2B7127_4/CS (SOLUTIONS)
SPONGE GAUZE 4X4IN EXCLN AMD PHMB 6 PLY ANTIMIC NWVN HYPOALL LF  STRL DISP (WOUND CARE SUPPLY) ×1 IMPLANT
SPONGE GAUZE STRL 4 X 4IN TUB_6939 1280/CS (WOUND CARE SUPPLY) ×1 IMPLANT
SPONGE GAUZE STRL 4 X 4IN TUB_6939 1280/CS (WOUND CARE/ENTEROSTOMAL SUPPLY) ×1
SPONGE SPLIT DRAIN_7088 12BX/CS 25PK/BX (WOUND CARE/ENTEROSTOMAL SUPPLY) ×1
STEM FEM SYNERGY 160MM PRIM DIST BLT TIP TI POR HIP 14 HI OFST TAPER STRL ×1 IMPLANT
SUTURE 1 CTX VICRYL 27IN VIOL BRD COAT ABS (SUTURE/WOUND CLOSURE) ×6 IMPLANT
SUTURE 1 CTX VICRYL 27IN VIOL_BRD COAT ABS (SUTURE/WOUND CLOSURE) ×12
SUTURE 2-0 CT1 VICRYL 27IN UNDYED BRD COAT ABS (SUTURE/WOUND CLOSURE) ×4 IMPLANT
SUTURE 2-0 CT1 VICRYL 27IN UND_YED BRD COAT ABS (SUTURE/WOUND CLOSURE) ×4
SUTURE PDS 2 .5 CRC QUILL 36CM VIOL 2 ARM MONOF RVRS CUT ABS 40MM (SUTURE/WOUND CLOSURE) ×1 IMPLANT
SUTURE PDS 2 .5 CRC QUILL 36CM_VIOL 2 ARM MONOF RVRS CUT ABS (SUTURE/WOUND CLOSURE) ×1
SYRINGE LL 20ML LTX STRL MED (NEEDLES & SYRINGE SUPPLIES) ×1
TAPE SURG CLOTH 4IN X 10YD_2864 12/CS (MED/SURG TAPES) ×2 IMPLANT
TRAY ARTHROPLASTY DYNJ49125B (Disposable Trays) ×2 IMPLANT
TRAY CATH 16FR BARDEX LUBRICATH STATLK SAF-FLOW FOLEY URMTR ADV NATURAL RUB DDRGL 350ML STRL LTX (TRAY) ×1 IMPLANT
TRAY CATH 16FR BARDEX LUBRICAT_H STATLK SAF-FLOW FOLEY URMTR (TRAY) ×1

## 2013-05-22 NOTE — Nurses Notes (Signed)
58 year old male admitted postoperatively via cart in stable condition accompanied by SA. Report received from [facility] PACU RN. Patient had right total hip arthroplasty done under dura morph anesthesia. Alert and oriented x 3. Lungs clear, respirations even non- labored. O2 SAT 96 % on 1 L/NC. Hemovac to right hip putting out bloody drainage. Denies pain. Numbness in left leg, but can dorsi and plantar flex. Mepilex dressing dressing to right hip CDI. Ice to right hip. Wife at bedside. Compression boots applied. Allergic to Sulfa and shellfish.

## 2013-05-22 NOTE — PT Evaluation (Signed)
[facility]  Physical Therapy Initial Evaluation    Patient Name: Henry [name]  Date of Birth: Feb 25, 1955  Height:  175.3 cm (5' 9" )  Weight: 134.4 kg (296 lb 4.8 oz)  Room/Bed: 718/A  Payor: BLUE CROSS BLUE SHIELD / Plan: [place] BC/BS PPO / Product Type: PPO /      Date/Time of Admission: 05/22/2013  7:44 AM  Admitting Diagnosis:  right hip DJD      HPI:     Henry [name] is a 58 y.o. 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 Caryl Comes      reports that he has never smoked. He has never used smokeless tobacco. He reports that he drinks alcohol. He reports that he does not use illicit drugs.  History   Smoking status    Never Smoker    Smokeless tobacco    Never Used     Comment: very rare cigar         Subjective:   Flowsheet Info:    05/22/13 1717   Mutuality/Individual Preferences   !!What Anxieties, Fears or Concerns Do You Have About Your Health or Care? none   !!What Questions Do You Have About Your Health or Care? none   !!What Information Would Help [place] Give You More Personalized Care? none   Living Environment   Lives With spouse   Living Arrangements house   Home Assessment: Stairs in [place] Accessibility stairs to enter home;stairs within home   Number of Stairs to [place] 1   Number of Stairs Within Home 14  (7 then landing then 7 more )   Living Environment Comment pt  can stay on lower level of home if necessary   Functional Level Prior   Prior Functional Level Comment ambulating I but limited by pain    Change in Functional Status Since Onset of Current Illness/Injury yes   Pain/Comfort   PreTreatment Rating (Numbers Scale) 0/10 - no pain   PostTreatment Rating (Numbers Scale) 0/10 - no pain   Cognitive/Perceptual/Neuro Assessments   Cognitive/Perceptual/Neuro WDL WDL   Musculoskeletal Interventions   Activity Type activity promoted;sitting, edge of bed;ambulated in hall;dorsiflexion, plantar flexion performed;ROM, active encouraged;ROM, active performed   Activity Level of Assistance assistance, 1 person   Activity Assistive Device walker   Ambulation Distance (Feet) 60   Positioning up in chair   Coping/Psychosocial Response Assessments   Observed Emotional State calm;cooperative   Verbalized Emotional State acceptance   Coping/Psychosocial Response Interventions   Plan of Care Reviewed with patient       Patient Goals: return to PLOF       Objective:  Cognition: Alert, Awake, Cooperative and Oriented Person, Place and Time  Follows Directions: Simple  and Complex      ROM  Left Right   Hip  [facility]  Not Assessed   Knee  [facility]  [facility]    Ankle  [facility]       STRENGTH  Left Right   Hip Flexion WFL not assessed    Extension WFL not assessed   Knee Flexion [facility]    Extension [facility]   Ankle DF [facility] WFL    PF WFL WFL       Bed Mobility: supine to sit contact guard ; VCs for technique   Transfers: sit to stand contact guard  Balance: Sitting Good Standing Fair/Fair+   Ambulation: 60 ft CGA using FWW; VCs for gait pattern. Initial LOB at EOB turning but able to self correct.   Other: Pt up in chair, reviewed exercises pt already had been doing: APs, QS, GS, and heel slides. Left with all needs in reach and VD applied.     Assessment:   Pt tolerated mobility 0 days s/p R THA very well. At this time pt presents with no pain and is only limited by slight dizziness. Pt had reviewed whole  Total Joint Playbook and knew all precautions/exercises and anticipate he will progress well assuming pain control. Anticipate no concerns to d/c home with assist and FWW in 1-2 days.     Rehab Potential: Good    Problem List: Decreased Ambulation, Decreased Balance, Pain  and Safety  Barriers to Discharge: none with assist     Goals:   Prior to d/c from this facility:     Negotiate 12 stairs with LRAD and CGA   Transfer supine to sit MI with no VCs for technique   Transfer sit to stand Supervision following hip precautions   Demonstrate independence with Home exercise program   Demonstrate good dynamic standing balance for increased safety with functional mobility   Ambulate 250 ft MI using LRAD.     Discharge Needs:   Equipment Recommendations: Front Wheeled Walker The patient presents with mobility limitations due to impaired balance, weight bearing restrictions and impaired functional activity tolerance that significantly impair/prevent patients ability to participate in mobility-related activities of daily living (MRADLs) including  ambulation and transfers in order to safely complete, toileting, safely entering/exiting the home. This functional mobility deficit can be sufficiently resolved with the use of a  front wheel walker in order to decrease the risk of falls, morbidity, and mortality in performance of these MRADLs.  Patient is able to safely use this assistive device.    Further Treatment Recommendations: Home with assist     Plan:   Current Intervention:Total Joint Protocol   To provide physical therapy services 2 times/day for 5 days/week until discontinued.    The risks/benefits of therapy have been discussed with the patient/caregiver and he/she is in agreement with the established plan of care.     Therapist:   Karie Georges, PT 05/22/2013 17:20  Pager #: [phone]  Evaluation Time: 40 minutes  Time may include review of medical chart, obtaining patient's functional history from  patient/family/medical staff/case management/ancillary personnel, collaboration on findings and treatment options (with the above mentioned individuals), re-assessment, and acute care rehabilitation.

## 2013-05-22 NOTE — H&P (Signed)
[facility]                                                     H&P Update Form    Henry [name], Henry [name], 58 y.o. male  Date of Admission:  05/22/2013  Date of Birth:  Mar 02, 1955    05/22/2013    STOP: IF H&P IS GREATER THAN 30 DAYS FROM SURGICAL DAY COMPLETE NEW H&P IS REQUIRED.    Outpatient Pre-Surgical H & P updated the day of the procedure.  1.  H&P completed within 30 days of surgical procedure was performed by Dr. Caryl Comes on 05/16/2013 and has been reviewed, the patient has been examined, and no change has occured in the patients condition since the H&P was completed.       Change in medications: No      Last Menstrual Period: Not applicable      Comments:     2.  Patient continues to be appropiate candidate for planned surgical procedure. YES      Jacqualine Code, MD

## 2013-05-22 NOTE — OR Surgeon (Signed)
[place] [facility]                                     OPERATION SUMMARY    PATIENT NAME: Henry [name], Henry [name] [facility] DEYCXK:[id number]  DATE OF SERVICE:05/22/2013  DATE OF BIRTH: Jan 27, 1956    PREOPERATIVE DIAGNOSIS:  Severe osteoarthritis, right hip.    POSTOPERATIVE DIAGNOSIS:  Severe osteoarthritis, right hip.    NAME OF PROCEDURE:  Right total hip replacement.    SURGEON:  Jacqualine Code, MD.    ASSISTANTS:    1. Tomasa Rand, MD.  2. Consuelo Pandy, MD.    ANESTHESIA:  Surgical spinal with bupivacaine and Duramorph.    INDICATIONS FOR PROCEDURE:  Carman Essick is a 58 year old gentleman referred by Dr. Raleigh Callas with unremitting right hip pain after years of conservative care.  Total hip replacement surgery was offered.  The procedure, well-known risks and expectations for recovery were reviewed.  Consent for the procedure was provided.  Medical clearance was provided.    DESCRIPTION OF PROCEDURE:  The patient was taken to the operating room on May 22, 2013.  He underwent a bupivacaine and Duramorph spinal.  A Foley catheter was inserted.  A preoperative antibiotic was administered.  He was then positioned in the lateral decubitus position with the operative hip up.  A timeout was performed to ensure the correct side.  The operative extremity was then degreased with alcohol, prepped with DuraPrep and draped in the sterile fashion.  A timeout was repeated.  A curvilinear incision was created over the lateral hip.  Dissection was carried down to fascia using sharp dissection.  Bleeding points were identified and cauterized.  The fascia was then split along the length of the incision.  A self-retaining retractor was inserted.  The short external rotators were detached and tagged for later repair.  The hip capsule was opened and each corner tagged for later repair.  The femoral neck was exposed.  The  hip was dislocated.  The femoral neck was osteotomized according to preoperative templating.  An anterior capsulotomy was performed to enhance exposure.  The acetabulum was then exposed in a circumferential fashion.  The acetabular labrum was excised.  The acetabular teardrop was identified.  The acetabulum was then sequentially reamed with progressively larger reamers until a hemispherical bed of bleeding bone was encountered.  Satisfied with this, a trial acetabular component was impacted and found to be stable.  The final acetabular implant was selected and impacted into place.  Satisfied with the pressfit fixation, an appropriate-sized highly cross-linked polyethylene bearing was impacted into place and its stability assessed.  Satisfied with the acetabulum, attention was then turned to femoral preparation.  The femoral medullary canal was opened with a box-cutting chisel and T-handled canal finder.  The canal was then sequentially broached until axial and rotational stability was achieved.  Trial reductions were performed with various combinations of neck length and offset.  Once satisfied with stability, range of motion and leg length restoration, trial components were removed.  The final implant was selected and impacted down to the level of the calcar.  Trial reduction was repeated with various length neck trials.  The final component was  selected and inserted onto a clean and dry Morse taper.  The hip was reduced.  The wound was lavaged with pulsatile irrigation.  The posterior capsular tissue was repaired with #2 Vicryl suture.  Stability was then reassessed.  The fascia was then closed with interrupted #1 Vicryl suture.  Subcutaneous tissue was repaired with 2-0 Vicryl.  The skin was closed with a running subQ.  Dressings were applied.  The patient was awakened, extubated and taken to recovery in satisfactory condition.       COMPLICATIONS:  None.    ESTIMATED BLOOD LOSS:  500 mL.    SPECIMENS:   None.    OPERATIVE FINDINGS:  Severe osteoarthritis of the right hip.    FINAL COMPONENTS IMPLANTED:  Smith and Nephew size 14 high offset Synergy stem, size 0 x 36 Oxinium femoral head, size 58 R3 acetabular component, size 36-mm highly cross-linked polyethylene liner.      Jacqualine Code, MD  Assistant Professor  [facility] Department of Orthopaedics    PV/[id number]/[id number]; D: 05/22/2013 13:24:54; T: 05/22/2013 19:34:05    cc: Rosemarie Beath MD      Suanne Marker

## 2013-05-22 NOTE — OR PostOp (Signed)
Patient's wife updated by lasion, Keenan Bachelor.

## 2013-05-22 NOTE — OR PreOp (Signed)
Wife, Tammy, with pt.

## 2013-05-22 NOTE — OR Surgeon (Signed)
[facility]    Pre-Op Diagnosis: DJD right hip    Post Op Diagnosis: DJD right hip    Procedure: Right THA    Surgeon: Peyton Bottoms MD    Assistant: Arlis Porta MD, Ileene Patrick    Anesthesia: Duramorph and Bupivicaine Spinal    Complications: None    EBL: 500    Fluids: Crystalloid    Drains: Hemovac    Specimens: Femoral head    Implants: Tamala Julian and Nephew    Findings: Severe DJD     Wound Classification: clean    Disposition: The patient was transported to recovery in stable condition.    Peyton Bottoms MD

## 2013-05-22 NOTE — Care Plan (Signed)
Problem: General Plan of Care(Adult,OB)  Goal: Individualization/Patient Specific Goal(Adult/OB)  Outcome: Ongoing (see interventions/notes)  Plan of care reviewed with patient. OOb with walker and assist to prevent fall. Pain managed with dura morph spinal block. Ambulate in hall with assist . Up in chair for all meals.

## 2013-05-22 NOTE — Anesthesia Transfer of Care (Cosign Needed)
ANESTHESIA TRANSFER OF CARE NOTE        Anesthesia Service      [facility]         Last Vitals: Temperature: 36.3 C (97.3 F) (05/22/13 1339)  Heart Rate: 87 (05/22/13 1339)  BP (Non-Invasive): 119/60 mmHg (05/22/13 1339)  Respiratory Rate: 14 (05/22/13 1339)  SpO2-1: 100 % (05/22/13 1339)  Pain Score (Numeric, Faces): 0 (05/22/13 0800)    Patient transferred to PACU in stable condition. Report given to RN. Patient awake and communicating in PACU. Denies pain, Minimal movement of bilateral lower extremities in PACU.    4/6/2015at 13:43.

## 2013-05-22 NOTE — OR Nursing (Signed)
Asked patient about his shellfish allergy and whether or not he has been exposed to betadine or contrast with any problems.  Patient stated he had been exposed to these with no problems.  Patient's gold ring on finger taped with paper tape.

## 2013-05-22 NOTE — Progress Notes (Addendum)
Ortho Recon Post-op Note  Henry [name]   05/22/2013    Henry [name] [facility]  04/23/1955  [id number]    Day of Surgery - R THA    SUBJECTIVE: Patient stable in PACU, pain controlled    OBJECTIVE:   Filed Vitals:    05/22/13 0848   BP: 144/75   Pulse: 83   Temp: 36.9 C (98.4 F)   Resp: 14   SpO2: 96%     Gen: NAD  Lungs: non-labored breathing  CV: pulses regular rate  Abd: non-distended  MS: atraumatic, distal pulses palpable, No C/C/E  RLE: ted hose and hemovac in place, dressing C/D/I.  No motor/sensation secondary to block.  CR <2s in all toes.  Palpable DP/PT    ASSESSMENT: 58 y.o. yo male Day of Surgery s/p R THA    PLAN:    Admit per Dr. Caryl [name] and the Ortho service   WBAT RLE   Abx - 24 hours post op ancef   PT/OT - ambulation assistance   DVT ppx - Lovenox 40 mg daily x 2 weeks   Pain control - Tylenol, Morphine, Oxycodone   Diet - Regular   Dispo: Pending patient progress, pain control, PT/OT    Henry Cornelia, MD  [place]  PGY-3  Pg # [phone]  Henry Bumpers, MD 05/22/2013, 13:38      Patient awake and alert  Hemodynamically stable  Post-operative x-rays show a well aligned and located total joint  Peroneal and tibial nerve function is intact  Baseline pedal pulses - operative foot warm and well perfused  Stable post-op  Henry Bottoms MD  Assistant Professor Orthopaedic Surgery

## 2013-05-22 NOTE — Anesthesia Procedure Notes (Signed)
Spinal    Indication: surgical analgesia            Block additional notes: A 25 gauge 159m  sprotte needle was used.    Sterile Prep/Technique: Chlorhexidine, gloves , mask and patient draped  site marked, H&P updated and consent obtained, Patient monitors applied, Timeout performed, Emergency drugs and equipment available and Patient positioned    Position: sitting    Skin local: Lidocaine 1%      Catheter events: no complications      CRNA/Resident: Damier Disano  Anesthesiologist: DAVIS, CHRISTOPHER

## 2013-05-22 NOTE — Anesthesia Postprocedure Evaluation (Signed)
ANESTHESIA POSTOP EVALUATION NOTE        Anesthesia Service      [facility]     05/22/2013     Last Vitals: Temperature: 36.8 C (98.2 F) (05/22/13 1400)  Heart Rate: 83 (05/22/13 1400)  BP (Non-Invasive): 115/59 mmHg (05/22/13 1400)  Respiratory Rate: 17 (05/22/13 1400)  SpO2-1: 99 % (05/22/13 1400)  Pain Score (Numeric, Faces): 3 (05/22/13 1345)    Procedure(s):  ARTHROPLASTY HIP TOTAL    Patient is sufficiently recovered from the effects of anesthesia to participate in the evaluation and has returned to their pre-procedure level.  I have reviewed and evaluated the following:  Respiratory Function: Consistent with pre anesthetic level  Cardiovascular Function: Consistent with pre anesthetic level  Mental Status: Return to pre anesthetic baseline level  Pain: Sufficiently controlled with medication  Nausea and Vomiting: Absent or sufficiently controlled with medication  Post-op Anesthetic Complications: None    Comment/ re-evaluation for any variations: None

## 2013-05-23 LAB — BASIC METABOLIC PANEL
ANION GAP: 9 mmol/L (ref 4–13)
BUN/CREAT RATIO: 20 (ref 6–22)
BUN: 17 mg/dL (ref 8–25)
CALCIUM: 8.6 mg/dL (ref 8.5–10.4)
CARBON DIOXIDE: 24 mmol/L (ref 22–32)
CHLORIDE: 103 mmol/L (ref 96–111)
CREATININE: 0.83 mg/dL (ref 0.62–1.27)
ESTIMATED GLOMERULAR FILTRATION RATE: >59 ml/min/1.73m2 (ref >59)
GLUCOSE,NONFAST: 154 mg/dL — ABNORMAL HIGH (ref 65–139)
POTASSIUM: 4.5 mmol/L (ref 3.5–5.1)
SODIUM: 136 mmol/L (ref 136–145)

## 2013-05-23 LAB — CBC
HCT: 29.8 % — ABNORMAL LOW (ref 36.7–47.0)
HGB: 9.4 g/dL — ABNORMAL LOW (ref 12.5–16.3)
MCH: 22.4 pg — ABNORMAL LOW (ref 27.4–33.0)
MCHC: 31.5 g/dL — ABNORMAL LOW (ref 32.5–35.8)
MCV: 71 fL — ABNORMAL LOW (ref 78–100)
MPV: 7.1 fL — ABNORMAL LOW (ref 7.5–11.5)
PLATELET COUNT: 264 10*3/uL (ref 140–450)
RBC: 4.2 MIL/uL (ref 4.06–5.63)
RDW: 16.6 % — ABNORMAL HIGH (ref 12.0–15.0)
WBC: 13.1 10*3/uL — ABNORMAL HIGH (ref 3.5–11.0)

## 2013-05-23 MED ORDER — CHOLECALCIFEROL (VITAMIN D3) 1,250 MCG (50,000 UNIT) CAPSULE
50000.00 [IU] | ORAL_CAPSULE | ORAL | Status: DC
Start: 2013-05-23 — End: 2013-07-04

## 2013-05-23 MED ORDER — ASPIRIN 325 MG TABLET
325.0000 mg | ORAL_TABLET | Freq: Every day | ORAL | Status: AC
Start: 2013-05-23 — End: 2013-07-04

## 2013-05-23 MED ORDER — ASPIRIN 325 MG TABLET
325.0000 mg | ORAL_TABLET | Freq: Every day | ORAL | Status: DC
Start: 2013-05-23 — End: 2013-05-23

## 2013-05-23 MED ADMIN — nystatin 100,000 unit/gram topical cream: INTRAVENOUS | @ 05:00:00 | NDC 00168005415

## 2013-05-23 MED ADMIN — furosemide 40 mg tablet: ORAL | @ 09:00:00

## 2013-05-23 NOTE — PT Treatment (Signed)
[facility]  Rehabilitation Services  Physical Therapy Progress Note    Patient Name: Henry [name]  Date of Birth: 10-18-1955  Height:  175.3 cm (5' 9" )  Weight:  134.4 kg (296 lb 4.8 oz)  Room/Bed: 718/A  Payor: BLUE CROSS BLUE SHIELD / Plan: [place] BC/BS PPO / Product Type: PPO /     Date/Time of Admission: 05/22/2013  7:44 AM  Admitting Diagnosis:  right hip DJD      Subjective:   Pleasant and cooperative. 4/10 pain which only slightly limits him.    Objective:   Precautions:  WBAT RLE, hip precautions  Pt in bed upon arrival;agreeable to PT.     Bed Mobility: sit to supine supervision with use of straight cane as leg lifter.   Transfers: sit to stand supervision  Balance: Sitting good Standing Good  Ambulation: 300 ft using FWW SBA with good pace and proper gait on R.   Stair training: negotiated 12 stairs using one cane and one hand railing SBA.    Therapeutic Exercise: reviewed seated exercises and pt demonstrated understanding.   Other: Pt up in chair post treatment, left with all needs in reach and no concerns.     Assessment:   Pt tolerating mobility well and is safe negotiating stairs with cane and a hand rail. No concerns for d/c home today from PT standpoint.     Discharge Needs:   Equipment Recommendations: The patient presents with mobility limitations due to impaired balance, weight bearing restrictions and impaired functional activity tolerance that significantly impair/prevent patients ability to participate in mobility-related activities of daily living (MRADLs) including  ambulation and transfers in order to safely complete, toileting. This functional mobility deficit can be sufficiently resolved with the use of a  front wheel walker in order to decrease the risk of falls, morbidity, and mortality in performance of these MRADLs.  Patient is able to safely use this assistive device.    Further Treatment Recommendations: Home with assist   Plan:   Continue to  follow patient according to established plan of care.    The risks/benefits of therapy have been discussed with the patient/caregiver and he/she is in agreement with the established plan of care.     Therapist:   Karie Georges, PT 05/23/2013 13:13  Pager #: [phone]  Treatment Time: 20 minutes

## 2013-05-23 NOTE — Discharge Summary (Addendum)
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5 mg total) by mouth Once a day  Qty: 30 Tab, Refills: 0      ondansetron (ZOFRAN) 4 mg Oral Tablet Take 1 Tab (4 mg total) by mouth Every 8 hours as needed for nausea/vomiting  Qty: 20 Tab, Refills: 0      sennosides (SENNA) 8.6 mg Oral Tablet Take 1 Tab (8.6 mg total) by mouth Twice daily  Qty: 60 Tab,  Refills: 0         CONTINUE these medications which have NOT CHANGED    Details   atorvastatin (LIPITOR) 20 mg Oral Tablet Take 1 Tab (20 mg total) by mouth Every evening  Qty: 90 Tab, Refills: 3      dicyclomine (BENTYL) 20 mg Oral Tablet Take 1 Tab (20 mg total) by mouth Four times a day  Qty: 360 Tab, Refills: 3      GLUCOS CHOND CPLX ADVANCED ORAL qd      M-VIT ORAL qd      metoprolol succinate (TOPROL XL) 25 mg Oral Tablet Sustained Release 24 hr Take 1 Tab (25 mg total) by mouth Once a day  Qty: 90 Tab, Refills: 3      metroNIDAZOLE (METROGEL) 1 % Gel with Pump by Apply externally route Once a day      Omeprazole 20 mg Oral Tablet, Delayed Release (E.C.) take  by mouth.      traMADol (ULTRAM) 50 mg Oral Tablet Take 1 Tab (50 mg total) by mouth Every 6 hours as needed  Qty:  120 Tab, Refills: 0         STOP taking these medications       ASPIR-81 81 mg TbEC Comments:   Reason for Stopping:         diclofenac sodium (VOLTAREN) 75 mg Oral Tablet, Delayed Release (E.C.) Comments:   Reason for Stopping:             DISCHARGE INSTRUCTIONS:      DISCHARGE INSTRUCTION - MISC   _________________________________________________    Total Hip Replacement Discharge Instructions    -You may Fully bear weight on the operative leg    -You Must use crutches or a walker until further notice.    - You May Not switch to the use of a cane.     - No driving until further notice.  The decision to commence driving will be decided at your first postoperative visit.    - Resume a healthy diet when you get home.    - Resume your usual home medications when you get home unless otherwise indicated at the time of discharge.    - Smoking raises one's risk of post-operative infection, wound healing problems and DVT (blood clots).  If you are a smoker and cannot quit permanently, it is best that you wait at least four weeks from surgery before you resume smoking.        Exercise and restrictions    - No weightlifting (resistive exercise) with  the operative leg for the first 6 weeks.    - No straight leg raising exercise after total hip replacement for 6 weeks as it causes groin pain.    -Observe hip dislocation precautions as instructed for the first 6 weeks.  Please refer to the physical therapist's instructions and therapy diagrams.     -Posterior hip precautions as follows:  1. Do not sit in low chair or commode  2. Do not cross your legs  3. Do not allow your operative knee to cross behind the opposite knee  4. You may sleep on either side as long as there is a pillow between your legs  5. You are always safest with your legs apart and your feet turned out        Wound care    - Change the bandage daily with dry gauze.  The incision may be left open to air if there is no drainage.  If there is any drainage 7 days after surgery, please call me as this may be a sign of infection.    - Two days after wound drainage stops, you may shower and wet the incision but you may not swim or soak in a tub.      DVT (blood clots)    -Swelling of the operative leg is normal after discharge because you will be more active at home. If however you develop new pain in the groin, thigh, or calf and if swelling is unrelieved with elevation, this may be a sign of a DVT (blood clot). Please notify your surgeon immediately if this occurs or go the nearest ER to have an ultrasound of your leg.    -Bruising of the operative leg is normal and sometimes the bruising travels all the way to the ankle, foot and toes.  This will gradually go from purple to green to yellow before it disappears completely.    -Aspirin is being used to prevent DVT (blood clots).  Please take one regular strength aspirin once per day for 6 weeks.    -  Additional DVT prevention will be TED hose (stockings) for 4 weeks.        Post-operative Infection    -Increasing pain, redness, warmth, shaking chills, sweats or fever may be a sign of a post-operative infection.  Please notify me immediately by calling  [phone] and ask to speak to the resident on call.    -A fever is not uncommon the first three days after surgery.  A fever of 100.5 or higher more than 72 hours after surgery is a concern and you should notify us if this occurs.    -Any drainage from the operative site after the first dressing change may be a sign of infection and requires my immediate attention.  Please call [phone] and ask to speak to the resident on call.    - No dental procedures for 6 weeks as this can cause infection in your new joint replacement    - You must receive antibiotics immediately prior to dental visits and procedures like colonoscopy and cystoscopy.  This is a life-long recommendation.          Falls    -A fall after surgery can be devastating.  Falls can result in dislocation, rupturing a ligament or tendon, or breaking a bone around the new prosthesis. These injuries can be extremely difficult to repair and often require a revision of the entire prosthesis.  Revision operations are associated with a higher complication rate.  Therefore it is crucial that you avoid falling after surgery.  Many falls are caused by poor lighting, a throw rug, a small pet, or too much haste.  Please take time to remove obstacles, secure pets somewhere safe, and improve lighting at home with the use of properly placed nightlights.        Post-operative pain    -Ice may be applied 20 minutes at at time every 2 hours as needed.  Be careful not to apply to bare skin as ice can cause burns just like heat.    -PRESCRIPTION NARCOTIC PAIN RELIEVERS ARE HABIT FORMING.  THEY SHOULD BE RESERVED FOR MODERATE TO SEVERE PAIN.  THE PRESCRIPTION NARCOTIC WILL NOT BE REFILLED FOR THIS PROBLEM.    -For mild pain, use 2 regular strength Tylenol every 6 hours as needed  -For moderate pain, take 1 prescription pain pill every 4 hours as needed  -For more severe pain, take 2 prescription pain pills every 4 hours as needed    - Prescription pain relievers are  constipating for most people.  Be prepared with over the counter remedies like Milk of Magnesia, Prune Juice, Hot Tea, etc.  Fleets enemas and suppositories are available over the counter as well.    - Mobic (meloxicam) is an anti-inflammatory medication that can help control post-operative pain and swelling.  After you complete a 30 day course, you can discontinue it's use and then resume over the counter anti-inflammatory medications like Advil or Aleve.  If you have a history of kidney disease, gastro-intestinal bleeding, or true allergy to Aspirin, may not want to fill this prescription.        - If you experience chest pain or shortness of breath, please call 911.  This may be a sign of a heart attack or pulmonary embolus (blood clot that travels to the heart and lung).    -Follow up with your surgeon 10 to 14 days from discharge.  The appointment date and time will be given to you either before discharge, or you will be contacted via  phone/mail with the date and time.     -Please re-read these discharge instructions when you return home as many of your questions may be answered here.     [facility] wishes you a speedy recovery - [phone]    _______________________________________________________________________     DME [facility] - FRONT WHEELED   Please note - If patient is 250 lbs or greater please order bariatric or heavy duty items.   Ht 175.3 cm    Wt 134.4 kg    Current Attending: Dr. Radene Journey    Medical Condition or Diagnosis which is primary reason for equipment: S/P  Right THA    Patient has mobility limits that significantly impairs ability to participate in one or more mobility related ADL's (MRADL's): Yes    Moblity Limitations: Pt at heightened risk of injury r/t attempts to fulfill MRADL's & can safely use walker which resolves issue    Walker Type: Front Wheeled    Estimated Length of Need (in months; 99 mo.= lifetime) 4         Cecil:  This is a 58 y.o., male who underwent a Right THA with Dr. Caryl Comes on 05/22/13.  Postoperatively, the pt received 24 hrs of IV antibiotics (Ancef) and was started on Lovenox 31m for DVT prophylaxis. His Foley catheter was removed on postop day #1,  his drain was pulled on postop day #2, and his dressing was changed on postop day #2. At the time of discharge his wound was clean, dry, and intact with no signs of infection. At the time of discharge his pain was well-controlled with po pain medicine, he was taking po well, and was passing gas.  Patient is weight bearing as tolerated right lower extremity and during his stay, worked with physical therapy who deemed him appropriate for Home discharge . He will follow up in 2 weeks with Dr. KCaryl Comesas instructed in discharge packet.      CONDITION ON DISCHARGE:  A. Ambulation: Ambulation with assistive device  B. Self-care Ability: Complete  C. Cognitive Status Alert and Oriented x 3    DISCHARGE DISPOSITION:  Home discharge           ABud Face PA-C        Copies sent to Care Team      Relationship Specialty Notifications Start End    SRosemarie Beath MD PCP - General   08/16/06     Phone: [phone]: [phone]        [id number]         Referring providers can utilize [email or web address] to access their referred [facility] information.      I personally reviewed the post-hospital care with the patient at the time of discharge.  Very specific instructions were reviewed with the patient in person and issued to the patient in print form.  All questions were answered and instructions regarding follow up were made.  The patient was encouraged to call my office with any questions or concerns.    APeyton BottomsMD  Assistant Professor Orthopaedic Surgery

## 2013-05-23 NOTE — Nurses Notes (Signed)
Foley cath removed per order. Patient tolerated without complaint.

## 2013-05-23 NOTE — OT Evaluation (Signed)
Occupational Therapy Initial Evaluation    Recent Diagnosis/Procedures: Henry [name] is a 58 y.o. 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 Caryl Comes     History     Social History Narrative    No narrative on file       SUBJECTIVE:   Pt is pleasant and cooperative. Pt and wife with multiple questions about the best way to manage ADL's and transfers at home as well as how much activity he should do upon d/c. Pt reports 5/10 pain in R hip, somewhat less with activity, per pt.     Flowsheet Info:  Pt lives with wife, 7 and 7 steps to enter main level of home with handrail on L side and landing in between two sets. Pt has an 18 inch toilet and walk in shower with ~2 inch step.     Patient Goals to go home    ROM  Left Upper Extremity: WFL  Right Upper Extremity: WFL    Upper Extremity Motor/Strength  Fine Motor Control: WFL  Gross Motor Control: WFL  Ataxia: no  Spontaneous Movement: yes  Hand Dominance: right  Endurance: fair +  Sensation: intact in BUE's  UE Strength: WFL  Cognition  Cognition: alert and cooperative  Orientation: oriented  Attention: WFL  Follows Commands: simple and complex  Short Term Memory: WFL  Long Term Memory: WFL  Problem Solving: WFL  Follow Through: WFL  Initiation: WFL  Safety: WFL  Perception  R/L Discrimination: WFL  Body Scheme: WFL  Vision  Visual Tracking: fluent  Visual Correction: glasses  Vision Neglect: no  Hemianopsia: no      Activities  of Daily Living  Eating: mod I  Grooming: supervision  Bath: Upper Extremity - set up A           Lower Extremity - set up A using AE  Dressing: Upper Extremity - supervision                 Lower Extremity - set up A using AE  Toileting: supervision  Weight Bearing Restrictions: WBAT RLE  Sitting Balance: Static good                            Dynamic good  Standing Balance: Static good                                 Dynamic good  Bed Mobility: SBA, cues for managing THA precautions  Functional Transfers: sit to stand/stand to sit from EOB, chair and elevated toilet with SBA  Posture: midline  Functional Activity: functional mobility within room  and into hallway a household distance using FWW with SBA, pt up/down 12 steps using cane and L handrail ascending with SBA. Pt returned to room and seated in chair at end of session, call light within reach and pt and wife with no further questions.   Patient/Family Education: Instructed on increasing activity tolerance, how much activity he should complete at home, hip precautions, adaptations and safety    IADL Performance  Not able to formally assess    Rehabilitation Potential  Good    Problems  ADL's, IADL's, mobility and safety and transfers      Assessment:   Henry [name] responded well to OT evaluation. Pt is able to use adaptive equipment for ADL tasks while maintaining hip precautions. Pt is able to complete toilet, shower, bed and chair transfers without assistance. Pt uses FWW safely and will need one for home. Safe for d/c home with wife.   Barriers to Discharge: none anticipated   D/C Recommendations/Needs:   Activity Recommendations: up ad lib  Equipment - FWW - Necessary for accomplishing basic life tasks such as use of the restroom, access to self care items, mobility in the home, and participation in social and community activities  Other Treatment Recommendations -  Home with assist    Occupational Therapy Goals:  By d/c the patient will demo good  endurance throughout OT session to improve activity tolerance.   By d/c the patient will complete bed mobility and transfers with mod I/I to increase safety and independence with ADLs.   By d/c the patient will complete UB and LB ADLs with mod I/I using AE prn.   By d/c the patient will complete household distances of functional mobility with mod I/I using LRAD as needed for safety and increased independence.    PLAN   ADL retraining, IADL retraining, functional mobility/transfer training, energy conservation/endurance training, therapeutic exercise, home safety training and patient/family education    To provide occupational therapy services 1 times/day for a minimum of 3 days/week until d/c      Recommended referral while in hospital for none        Evaluation Time:   55 minutes    Henry [name], [place], OTR/L   Therapist beeper #  [phone]    Time may include review of medical chart, obtaining patient's functional history from patient/family/medical staff/case management/ancillary personnel, collaboration on findings and treatment options (with the above mentioned individuals), re-assessment, documentation and acute care rehabilitation.

## 2013-05-23 NOTE — Nurses Notes (Signed)
Pt ambulated 250 feet in the hallway with one person assistance and a walker. Pt was strong on feet and ambulated without any complaints. Pt rating pain 3/10 upon arrival to his room and placed back in bed per pt request. Call bell in reach and encouarged to call out before getting out of bed to prevent falls. Will monitor.

## 2013-05-23 NOTE — Care Management Notes (Signed)
[facility] Management Initial Evaluation    Patient Name: Henry [name] [facility]  Date of Birth: December 01, 1955  Sex: male  Date/Time of Admission: 05/22/2013  7:44 AM  Room/Bed: 718/A  Payor: BLUE CROSS BLUE SHIELD / Plan: [place] BC/BS PPO / Product Type: PPO /   PCP: Rosemarie Beath, MD    Pharmacy Info:   Preferred Pharmacy    [id number] [place], [address]   [id number]   Phone: [phone]: [phone]   Open 24 Hours?: No        Emergency Contact Info:   Extended Emergency Contact Information  [facility] Emergency Contact[facility] [address] [place] [address]nette Litterof AManteePhone: [phone] Work Phone: [phone] Mobile Phone: [phone] Relation: Wife    History:   Henry Glasscockis a 58y.o., male, admitted S/P Right THA      Height/Weight: 175.3 cm (5' 9" ) / 134.4 kg (296 lb 4.8 oz)     LOS: 1 day   Admitting Diagnosis: right hip DJD    Assessment:    05/23/13 1000   Assessment Detail   Assessment Type Admission   Date of Care Management Update 05/23/13   Date of Next DCP Update 05/26/13   Care Management  Plan   Discharge Planning Status initial meeting   Projected Discharge Date 05/23/13   Anticipated Discharge Disposition home with assist   CM will evaluate for rehabilitation potential yes   Patient choice offered to patient/family yes   Form for patient choice reviewed/signed and on chart yes   [facility] or [facility]   Patient/Family in Agreement with Plan yes   Discharge Needs Assessment   Concerns to be Addressed discharge planning concerns   Equipment Currently Used at Home (high toilet)   Equipment Needed After Discharge walker, rolling   Discharge Facility/Level of Care Needs Home with DME (code 1)   Transportation Available car;family or friend will provide   Referral Information   Admission Type inpatient      Arrived From post anesthesia care unit   Address Verified verified-no changes   Insurance Verified verified-no change   Source of Information Patient;Spouse   Referral Source Physician   Reason for Consult discharge planning   Care Manager Assigned to Case CMarcelline Deist  Employment/Financial   Patient has Prescription Coverage?  Yes       Name of Insurance Coverage for Medications cvs   Living Environment   Lives With spouse   Living Arrangements house   Home Safety   Home Assessment: [place] in [place] to enter [place]   [place] St[place] to [place]   Number of Stairs Within Home 14       Discharge Plan:  Home with DME (code 1)  S/P Right THA  Met with pt and wife/Tammy at bedside. Per therapy, will need FWW. [facility]. Informed pt that delivery may take 1-2 hours. Will follow.    The patient will continue to be evaluated for developing discharge needs.     Case Manager: BHattie Perch RN 05/23/2013, 11:00  Phone: [phone]

## 2013-05-23 NOTE — Nurses Notes (Signed)
Pt sitting up in chair and resting comfortably. Pt has no complaints of pain at this time. Call bell in reach. Will monitor.

## 2013-05-23 NOTE — Care Plan (Signed)
Problem: General Plan of Care(Adult,OB)  Goal: Plan of Care Review(Adult,OB)  The patient and/or their representative will communicate an understanding of their plan of care   Outcome: Unable to achieve outcome at discharge (see discharge plan/notes) Date Met:  05/23/13  Pt's pain managed with PRN Toradol and Oxy IR. Pt able to rest aqeduate amounts throughout the evening. Pt ambualted safely in the hallway 250 ft and 10 feet to the chair this morning with one person assistance and a walker. SCDs and TED hose maintained. Right hip dressing clean, dry, and intact. Foley draining clear yellow urine. Hemovac draining small/moderate amounts of bloody drainage. Pillow maintained between knees. Duramorph precautions maintained. WBAT to RLE. Pt is possible discharge to home today. Point of care reviewed with pt and spouse.

## 2013-05-23 NOTE — Nurses Notes (Signed)
Discharged in stable condition to family via wheelchair accompanied by SA. All belongings taken

## 2013-05-23 NOTE — Care Plan (Signed)
Problem: General Plan of Care(Adult,OB)  Goal: Plan of Care Review(Adult,OB)  The patient and/or their representative will communicate an understanding of their plan of care   Outcome: Ongoing (see interventions/notes)  Discharge Plan:  Home with DME (code 1)  S/P Right THA  Met with pt and wife/Tammy at bedside. Per therapy, will need FWW. Gets [facility]ap from [facility] and will task RC to arrange delivery to room. Informed pt that delivery may take 1-2 hours. Will follow.    The patient will continue to be evaluated for developing discharge needs.

## 2013-05-23 NOTE — Nurses Notes (Signed)
Discharge instructions given per AVS to patient and wife. Instructed to follow up with Dr. Caryl Comes in 2 weeks. Dressing change instructions and dressing supplies given. Extra set of ted hose given. Instruction on bathing and soaking restrictions given. Reviewed S/S of infection and hip precautions. Instructed to take Aspirin for DVT prophylaxis. Reviewed signs of DVT. Instructed to use walker until cleared by Dr. Caryl Comes and to report any falls. Discharge prescriptions given to patient; to be filled at home pharmacy. Awaiting delivery of walker to unit for discharge.

## 2013-05-23 NOTE — Progress Notes (Addendum)
[facility]  Orthopaedics Progress Note      Date of service: 05/23/2013  Post-op day: 1 Day Post-Op S/P  Right THA    Subjective:  Patient still feeling good, with little pain.  Objective  AFVSs  Dressing c/d/i  Hemovac drain removed.  Able to flex/ext right toes and ankle  Sensation intact right toes  Palpable right PT pulse    Labs Reviewed  CBC Results Differential Results   Recent Labs      05/23/13   0423   WBC  13.1*   HGB  9.4*   HCT  29.8*   PLTCNT  264    No results found for this or any previous visit (from the past 30 hour(s)).       Assessment/ Plan:   PT/OT WBAT RLE  DVT Prophylaxis: Lovenox 53m daily  Post-op antibiotics: ancef to be completed today.  Pain control: tylenol, Toradol, Oxycodone, and Morphine  Dispo: Possibly today. We will see how he does with PT today.  He has stairs at home to climb, so we would like him to work on this.     ADonnetta Hail MD 05/23/2013, 08:53  Pager [phone]    Acute post-operative blood loss anemia without symptoms - will observe for now.  Pain well controlled  I personally rounded on the patient.  I examined the patient, reviewed vital signs, and reviewed laboratory data.  The above note has been reviewed and I concur with its findings.  APeyton BottomsMD  Assistant Professor Orthopaedic Surgery

## 2013-05-23 NOTE — Nurses Notes (Signed)
Pt has no complaints of nausea. Pt refusing scheduled zofran at this time. Pt encouraged to call for medication if pt begins to feel nauseous. Call bell within reach. Will monitor.

## 2013-05-24 NOTE — Care Management Notes (Signed)
Referral Information  ++++++ Placed Provider #1 ++++++  Case Manager: Kristjan Derner  Provider Type: DME  Provider Name: [facility]  Address:  [address]  [place], [place] [address]  Contact:    Fax:   Fax:

## 2013-05-27 ENCOUNTER — Telehealth (HOSPITAL_COMMUNITY): Payer: Self-pay | Admitting: Orthopaedic Surgery

## 2013-05-27 NOTE — Telephone Encounter (Signed)
[place] for [facility] Replacement  I called the patient to see how he was feeling.  He is surprised that he is doing as well as he is.  The patient was encouraged to call with questions or concerns.  Peyton Bottoms MD  Assistant Professor Orthopaedic Surgery

## 2013-05-31 ENCOUNTER — Encounter (HOSPITAL_BASED_OUTPATIENT_CLINIC_OR_DEPARTMENT_OTHER): Payer: Self-pay | Admitting: Internal Medicine

## 2013-06-01 ENCOUNTER — Encounter (HOSPITAL_BASED_OUTPATIENT_CLINIC_OR_DEPARTMENT_OTHER): Payer: Self-pay | Admitting: Neurology

## 2013-06-06 ENCOUNTER — Ambulatory Visit: Payer: BC Managed Care – PPO | Attending: Orthopaedic Surgery | Admitting: Orthopaedic Surgery

## 2013-06-06 ENCOUNTER — Encounter (HOSPITAL_BASED_OUTPATIENT_CLINIC_OR_DEPARTMENT_OTHER): Payer: Self-pay | Admitting: Orthopaedic Surgery

## 2013-06-06 DIAGNOSIS — Z09 Encounter for follow-up examination after completed treatment for conditions other than malignant neoplasm: Secondary | ICD-10-CM | POA: Insufficient documentation

## 2013-06-06 DIAGNOSIS — Z96649 Presence of unspecified artificial hip joint: Secondary | ICD-10-CM

## 2013-06-06 DIAGNOSIS — Z7982 Long term (current) use of aspirin: Secondary | ICD-10-CM | POA: Insufficient documentation

## 2013-06-06 NOTE — Progress Notes (Addendum)
[place], [place] [address]                                PATIENT NAME: Henry [name], [name] [id number]  DATE OF SERVICE:06/06/2013  DATE OF BIRTH: [birth year]    PROGRESS NOTE    DATE OF SURGERY:  May 22, 2013, right total hip arthroplasty.    SUBJECTIVE:  Henry [name] is a 58 year old male who is now 2 weeks status post the above-mentioned procedure.  He presents today reporting that besides a little startup pain when he is walking in his hip, he is overall doing much better.  He is walking without a front-wheeled walker, using only a cane as needed for long distances.  He denied any fevers or chills.  He denies any redness or drainage around the incision site and he is continuing to take his aspirin for DVT prophylaxis, but has discontinued the use of the TED hose.  On examination, he is alert and oriented, in no acute distress.  His incision site is well-sealed without any signs of erythema or drainage.  When he walks, he can consciously avoid walking with a waddling gait.  On Examination of the distal lower extremities, his sensation is intact to light touch in the right foot and he is able to dorsiflex and plantar flex the right ankle.    RADIOGRAPHS:  No x-rays were obtained at this time.    ASSESSMENT:  Status post a right total hip arthroplasty.    PLAN:  Today, we told Henry [name] that he can begin driving as long as he is off of his narcotic pain medications.  Also, we told him that he is to continue to take his aspirin for 4 more weeks and he is to avoid tub baths or soaks.  He can continue showering at this time and he can discontinue the use of the cane for short distances, but use it for long distances.  We will see him back in our clinic in approximately 4 weeks, at which time he will need x-rays of the right hip.      Tomasa Rand,  MD  Resident  [place] Department of Orthopaedics    Jacqualine Code, MD  Assistant Professor  [facility] Department of Orthopaedics    [id number]; D: 06/06/2013 09:42:53; T: 06/06/2013 22:25:57    cc: Rosemarie Beath MD      INBASKET      Unremarkable x-rays of the right hip  No sign of SSI or DVT    I saw and examined the patient.  I reviewed the resident's note.  I agree with the findings and plan of care as documented in the resident's note.  Any exceptions/additions are edited/noted.    Jacqualine Code, MD 06/08/2013, 09:55

## 2013-06-21 ENCOUNTER — Ambulatory Visit (HOSPITAL_BASED_OUTPATIENT_CLINIC_OR_DEPARTMENT_OTHER): Payer: Self-pay | Admitting: Physician Assistant

## 2013-06-21 NOTE — Telephone Encounter (Addendum)
Returned patient call. He reports cervical radiculopathic symptoms on the right side for the last 2 weeks. He does state that he has been diagnosed with arthritis in his neck. He thinks he irritated a nerve with the use of the FWW and cane after surgery. He describes sharp pain that radiate across the shoulder blade down into his elbow. It is reproducible with hyperextension of the neck and turning his head to the right. He denies any weakness in the hand. He also denies any chest pain or SOB. I suggested he try stretching, massage, ice and anti-inflammatories. If it does not improve or worsen before his next visit, he will let us know.     Elyse Jarvis, PA-C  06/21/2013, 15:18       ----- Message from Sunday Shams sent at 06/21/2013  2:52 PM EDT -----  >> Sunday Shams 06/21/2013 02:52 PM  Caryl Comes pt-pt calling stating he is having pain in his neck and is wanting to discuss this with you. Pt states he had surgery on 05/22/13 to replace right hip. Pt states he thinks it may be irritated due to having to use his arms more since surgery. Please advise pt at [phone].  Thank you.

## 2013-07-04 ENCOUNTER — Ambulatory Visit (HOSPITAL_BASED_OUTPATIENT_CLINIC_OR_DEPARTMENT_OTHER): Payer: BC Managed Care – PPO | Admitting: Family Medicine

## 2013-07-04 ENCOUNTER — Ambulatory Visit
Admission: RE | Admit: 2013-07-04 | Discharge: 2013-07-04 | Disposition: A | Discharge status: Home / Self Care | Payer: BC Managed Care – PPO | Source: Ambulatory Visit | Attending: Orthopaedic Surgery | Admitting: Orthopaedic Surgery

## 2013-07-04 ENCOUNTER — Encounter (HOSPITAL_BASED_OUTPATIENT_CLINIC_OR_DEPARTMENT_OTHER): Payer: Self-pay | Admitting: Orthopaedic Surgery

## 2013-07-04 ENCOUNTER — Encounter (HOSPITAL_BASED_OUTPATIENT_CLINIC_OR_DEPARTMENT_OTHER): Payer: Self-pay | Admitting: Family Medicine

## 2013-07-04 ENCOUNTER — Ambulatory Visit (HOSPITAL_BASED_OUTPATIENT_CLINIC_OR_DEPARTMENT_OTHER): Payer: BC Managed Care – PPO | Admitting: Orthopaedic Surgery

## 2013-07-04 ENCOUNTER — Ambulatory Visit (HOSPITAL_BASED_OUTPATIENT_CLINIC_OR_DEPARTMENT_OTHER): Payer: BC Managed Care – PPO

## 2013-07-04 VITALS — BP 138/79 | HR 80 | Temp 98.1°F | Ht 69.0 in | Wt 300.5 lb

## 2013-07-04 DIAGNOSIS — I10 Essential (primary) hypertension: Secondary | ICD-10-CM | POA: Insufficient documentation

## 2013-07-04 DIAGNOSIS — E7800 Pure hypercholesterolemia, unspecified: Secondary | ICD-10-CM | POA: Insufficient documentation

## 2013-07-04 DIAGNOSIS — Z96649 Presence of unspecified artificial hip joint: Secondary | ICD-10-CM | POA: Insufficient documentation

## 2013-07-04 DIAGNOSIS — E785 Hyperlipidemia, unspecified: Secondary | ICD-10-CM

## 2013-07-04 DIAGNOSIS — M169 Osteoarthritis of hip, unspecified: Secondary | ICD-10-CM

## 2013-07-04 DIAGNOSIS — D649 Anemia, unspecified: Secondary | ICD-10-CM | POA: Insufficient documentation

## 2013-07-04 DIAGNOSIS — M5090 Cervical disc disorder, unspecified, unspecified cervical region: Secondary | ICD-10-CM

## 2013-07-04 DIAGNOSIS — Z9989 Dependence on other enabling machines and devices: Secondary | ICD-10-CM | POA: Insufficient documentation

## 2013-07-04 DIAGNOSIS — M1610 Unilateral primary osteoarthritis, unspecified hip: Secondary | ICD-10-CM

## 2013-07-04 DIAGNOSIS — M5412 Radiculopathy, cervical region: Secondary | ICD-10-CM | POA: Insufficient documentation

## 2013-07-04 DIAGNOSIS — R209 Unspecified disturbances of skin sensation: Secondary | ICD-10-CM | POA: Insufficient documentation

## 2013-07-04 DIAGNOSIS — Z471 Aftercare following joint replacement surgery: Secondary | ICD-10-CM | POA: Insufficient documentation

## 2013-07-04 LAB — CBC/DIFF
BASOPHILS: 1 %
BASOS ABS: 0.089 THOU/uL (ref 0.000–0.200)
EOS ABS: 0.356 THOU/uL (ref 0.000–0.500)
EOSINOPHIL: 4 %
EOSINOPHIL: 4 %
HCT: 30.1 % — ABNORMAL LOW (ref 36.7–47.0)
HGB: 9.5 g/dL — ABNORMAL LOW (ref 12.5–16.3)
LYMPHOCYTES: 32 %
LYMPHS ABS: 2.848 THOU/uL (ref 1.000–4.800)
MCH: 21.3 pg — ABNORMAL LOW (ref 27.4–33.0)
MCHC: 31.5 g/dL — ABNORMAL LOW (ref 32.5–35.8)
MCV: 67.5 fL — ABNORMAL LOW (ref 78–100)
MONOCYTES: 8 %
MONOS ABS: 0.712 THOU/uL (ref 0.300–1.000)
MPV: 6.9 fL — ABNORMAL LOW (ref 7.5–11.5)
PLATELET COUNT: 312 10*3/uL (ref 140–450)
PMN ABS: 4.895 10*3/uL (ref 1.500–7.700)
PMN'S: 55 %
RBC: 4.46 MIL/uL (ref 4.06–5.63)
RDW: 16.7 % — ABNORMAL HIGH (ref 12.0–15.0)
WBC: 8.9 THOU/uL (ref 3.5–11.0)

## 2013-07-04 LAB — LDL CHOLESTEROL, DIRECT: LDL CHOLESTEROL,DIRECT: 65 mg/dL (ref <100)

## 2013-07-04 LAB — IRON TRANSFERRIN AND TIBC
IRON BINDING CAPACITY: 419 ug/dL — ABNORMAL HIGH (ref 260–400)
IRON SATURATION: 5 % — ABNORMAL LOW (ref 16–50)

## 2013-07-04 LAB — FERRITIN: FERRITIN: 16 ng/mL — ABNORMAL LOW (ref 20–300)

## 2013-07-04 LAB — IRON: IRON: 23 ug/dL — ABNORMAL LOW (ref 55–175)

## 2013-07-04 NOTE — Progress Notes (Signed)
[place] for [facility] Replacement    SUBJECTIVE:  The patient returns today 6 weeks status post right total hip replacement surgery.  The patient reports moderate pain.  Patient denies fever, chills or sweats.  Patient denies chest pain or shortness of breath.  The patient has completed Lovenox for DVT prophylaxis.  He complains of right arm pain and right hand tingling when he tilts or turns the head to the right.    EXAM:  On exam, there is no sign of infection.  There is no drainage.  The incision appears to be healed.  There is no obvious clinical sign of DVT.  He walks without a detectable limp.  ROM is as follows: 90/5/20/30/10    XRAYS:  Well aligned well fixed right THA, cementless Synergy and R3.  The left hip is mild to moderately arthritic.    IMPRESSION: Status post right total hip replacement, right sided cervical radiculitis.    PLAN:  Signs and symptoms of peri-prosthetic infection and thromboemobolism were re-iterated. Hip precautions were relaxed. The patient was encouraged to increase activity as tolerated.  Low impact activity was advised. He may continue with Diclofenac for the neck and arm pain.  PT is prescribed for the neck pain and right arm pain.  An MRI of the c-spine may be needed if he fails to improve with PT.  The patient will follow up in 6 weeks with a right hip series and c-spine series.     Peyton Bottoms MD  Assistant Professor Orthopaedic Surgery

## 2013-07-04 NOTE — Progress Notes (Signed)
Subjective:     Patient ID:  Henry [name] is an 58 y.o. male   Chief Complaint:    Chief Complaint   Patient presents with    Physical     annual check up        HPI Comments: 58 year old recently status post hip replacement following up for hypertension and hypercholesterolemia.He uses CPAP regularly and has no complaints. I reviewed his laboratory data and he was anemic post operatively with a hemoglobin of 9.4 hematocrit 29.8. He otherwise doing well feels the hip replacement was a great success.    Current Outpatient Prescriptions:  aspirin 325 mg Oral Tablet, Take 1 Tab (325 mg total) by mouth Once a day for 42 days  atorvastatin (LIPITOR) 20 mg Oral Tablet, Take 1 Tab (20 mg total) by mouth Every evening  diclofenac sodium (VOLTAREN) 75 mg Oral Tablet, Delayed Release (E.C.), Take 75 mg by mouth Twice daily  dicyclomine (BENTYL) 20 mg Oral Tablet, Take 1 Tab (20 mg total) by mouth Four times a day  GLUCOS CHOND CPLX ADVANCED ORAL, qd  M-VIT ORAL, qd  metoprolol succinate (TOPROL XL) 25 mg Oral Tablet Sustained Release 24 hr, Take 1 Tab (25 mg total) by mouth Once a day  metroNIDAZOLE (METROGEL) 1 % Gel with Pump, by Apply externally route Once a day  Omeprazole 20 mg Oral Tablet, Delayed Release (E.C.), take  by mouth.      ALLERGIES:   -- Sulfa (Sulfonamides) -- Rash   -- Shellfish Containing Products -- Itching and Swelling    --  Shrimp - eye swelling, itching          Review of Systems   Constitutional: Negative.    Respiratory: Negative.    Cardiovascular: Negative.    Gastrointestinal: Negative.    Genitourinary: Negative.    Musculoskeletal: Negative for joint pain.   Psychiatric/Behavioral: Negative.      Objective:   Physical Exam   Constitutional: He is oriented to person, place, and time. He appears well-developed and well-nourished.   BP 138/79    Pulse 80    Temp(Src) 36.7 C (98.1 F)    Ht 1.753 m (5' 9" )    Wt 136.3 kg (300 lb 7.8 oz)    BMI 44.35 kg/m2        HENT:   Mouth/Throat:  Oropharynx is clear and moist.   Cardiovascular: Normal rate, regular rhythm and normal heart sounds.  Exam reveals no gallop and no friction rub.    No murmur heard.  Pulm:  Breath sounds normal.    Ortho/Musculoskeletal:   He exhibits no edema.   Neurological: He is alert and oriented to person, place, and time.   Psychiatric: He has a normal mood and affect.     .    Assessment & Plan:     (272.4) Hyperlipidemia  (primary encounter diagnosis)  Plan:   Lab Results   Component Value Date    CHOLESTEROL 159 05/28/2011    HDLCHOL 40 05/28/2011    LDLCHOL 87 05/28/2011    TRIG 161* 05/28/2011      Continue current care repeat direct LDL with next blood draw    (V46.8) CPAP (continuous positive airway pressure) dependence  Plan: doing well with current set up    (401.9) Hypertension  Plan: repeat blood pressure acceptable encouraged weight loss with increased activity as he rehabs his hip    (285.9) Anemia  Plan: IRON, FERRITIN, TOTAL IRON BINDING CAPACITY,  CBC/DIFF        Iron studies have recommended multivitamin with iron for now

## 2013-07-07 ENCOUNTER — Other Ambulatory Visit (HOSPITAL_BASED_OUTPATIENT_CLINIC_OR_DEPARTMENT_OTHER): Payer: Self-pay | Admitting: Family Medicine

## 2013-07-07 DIAGNOSIS — D649 Anemia, unspecified: Secondary | ICD-10-CM

## 2013-07-07 MED ORDER — FERROUS SULFATE 325 MG (65 MG IRON) TABLET
325.00 mg | ORAL_TABLET | Freq: Three times a day (TID) | ORAL | Status: DC
Start: 2013-07-07 — End: 2016-01-27

## 2013-07-19 ENCOUNTER — Encounter (HOSPITAL_BASED_OUTPATIENT_CLINIC_OR_DEPARTMENT_OTHER): Payer: BC Managed Care – PPO | Admitting: Internal Medicine

## 2013-07-26 ENCOUNTER — Other Ambulatory Visit (HOSPITAL_BASED_OUTPATIENT_CLINIC_OR_DEPARTMENT_OTHER): Payer: Self-pay | Admitting: Family Medicine

## 2013-07-26 MED ORDER — DICLOFENAC SODIUM 75 MG TABLET,DELAYED RELEASE
75.0000 mg | DELAYED_RELEASE_TABLET | Freq: Two times a day (BID) | ORAL | Status: DC
Start: 2013-07-26 — End: 2014-09-03

## 2013-07-26 NOTE — Telephone Encounter (Signed)
Appts.  07-04-13 and 09-01-13  Henry [name]

## 2013-07-31 NOTE — Telephone Encounter (Signed)
Med e-rxd

## 2013-08-11 ENCOUNTER — Other Ambulatory Visit (HOSPITAL_BASED_OUTPATIENT_CLINIC_OR_DEPARTMENT_OTHER): Payer: Self-pay | Admitting: Orthopaedic Surgery

## 2013-08-11 DIAGNOSIS — Z96649 Presence of unspecified artificial hip joint: Secondary | ICD-10-CM

## 2013-08-11 DIAGNOSIS — M542 Cervicalgia: Secondary | ICD-10-CM

## 2013-08-21 ENCOUNTER — Other Ambulatory Visit (HOSPITAL_BASED_OUTPATIENT_CLINIC_OR_DEPARTMENT_OTHER): Payer: Self-pay | Admitting: Family Medicine

## 2013-08-21 MED ORDER — ATORVASTATIN 20 MG TABLET
20.0000 mg | ORAL_TABLET | Freq: Every evening | ORAL | Status: DC
Start: 2013-08-21 — End: 2014-08-22

## 2013-08-21 NOTE — Telephone Encounter (Signed)
Order for refill of Lipitor pended and routed to Dr. Angelia Mould per patient request.    Thurston Pounds, RN

## 2013-08-22 ENCOUNTER — Ambulatory Visit (HOSPITAL_BASED_OUTPATIENT_CLINIC_OR_DEPARTMENT_OTHER): Payer: BC Managed Care – PPO | Admitting: Orthopaedic Surgery

## 2013-08-22 ENCOUNTER — Ambulatory Visit (HOSPITAL_BASED_OUTPATIENT_CLINIC_OR_DEPARTMENT_OTHER): Payer: BC Managed Care – PPO

## 2013-08-22 ENCOUNTER — Ambulatory Visit
Admission: RE | Admit: 2013-08-22 | Discharge: 2013-08-22 | Disposition: A | Discharge status: Home / Self Care | Payer: BC Managed Care – PPO | Source: Ambulatory Visit | Attending: Orthopaedic Surgery | Admitting: Orthopaedic Surgery

## 2013-08-22 ENCOUNTER — Encounter (HOSPITAL_BASED_OUTPATIENT_CLINIC_OR_DEPARTMENT_OTHER): Payer: Self-pay | Admitting: Orthopaedic Surgery

## 2013-08-22 VITALS — BP 164/85 | HR 70 | Temp 97.6°F | Resp 18 | Ht 69.0 in | Wt 301.6 lb

## 2013-08-22 DIAGNOSIS — Z96649 Presence of unspecified artificial hip joint: Secondary | ICD-10-CM

## 2013-08-22 DIAGNOSIS — R29890 Loss of height: Secondary | ICD-10-CM

## 2013-08-22 DIAGNOSIS — M169 Osteoarthritis of hip, unspecified: Secondary | ICD-10-CM

## 2013-08-22 DIAGNOSIS — M4310 Spondylolisthesis, site unspecified: Secondary | ICD-10-CM

## 2013-08-22 DIAGNOSIS — Z471 Aftercare following joint replacement surgery: Secondary | ICD-10-CM | POA: Insufficient documentation

## 2013-08-22 DIAGNOSIS — M5030 Other cervical disc degeneration, unspecified cervical region: Secondary | ICD-10-CM

## 2013-08-22 DIAGNOSIS — M1610 Unilateral primary osteoarthritis, unspecified hip: Secondary | ICD-10-CM

## 2013-08-22 DIAGNOSIS — M542 Cervicalgia: Secondary | ICD-10-CM

## 2013-08-22 NOTE — Progress Notes (Addendum)
[place], [place] [address]                                PATIENT NAME: Henry [name], Henry [name] [facility] [id number]  DATE OF SERVICE:08/22/2013  DATE OF BIRTH: [birth year]    PROGRESS NOTE    SUBJECTIVE:  Mr. Soffer is a 58 year old male here for his 83-monthfollowup for right total hip arthroplasty.  He states he is doing very well, experiencing minimal to no pain.  He is back to work and not taking any pain medications or using any assistive devices for ambulation.  He denies any fever, chills, nausea, vomiting, diarrhea or constipation.    OBJECTIVE:  The patient is in no acute distress.  Mood and affect congruent and appropriate.  Right lower extremity, skin warm and dry, no lacerations or abrasions, and the incision is well-healed.  Range of motion is as follows: 90, 3, 15, 30, 10.  Strength is 5/5 with flexion/extension of the hip.  Sensation intact to light touch over the dorsal/plantar surfaces of the foot.    IMAGING:  Images were reviewed in the clinic with Dr. KCaryl Comestoday on the PACS system showing right total hip arthroplasty with well-fixed implants and no migration.    ASSESSMENT: s/p right THA    PLAN:  58year old male 3 months status post right total hip replacement.  The patient is doing very well with no issues.  He has no hip precautions and he is to continue physical activity as tolerable.  He was encouraged to lose weight. He is to return to clinic in 9 months.      MCarmina Miller MD  Resident  [place] Department of Orthopaedics    AJacqualine Code MD  Assistant Professor  [facility]    MLN/LGX/[id number] D: 08/22/2013 13:34:24; T: 08/22/2013 19:17:21

## 2013-09-01 ENCOUNTER — Encounter (HOSPITAL_BASED_OUTPATIENT_CLINIC_OR_DEPARTMENT_OTHER): Payer: BC Managed Care – PPO | Admitting: Family Medicine

## 2013-09-01 ENCOUNTER — Encounter: Payer: Self-pay | Admitting: *Deleted

## 2013-09-21 ENCOUNTER — Encounter (HOSPITAL_BASED_OUTPATIENT_CLINIC_OR_DEPARTMENT_OTHER): Payer: Self-pay | Admitting: Family Medicine

## 2013-09-21 ENCOUNTER — Ambulatory Visit (HOSPITAL_BASED_OUTPATIENT_CLINIC_OR_DEPARTMENT_OTHER): Payer: BC Managed Care – PPO | Admitting: Family Medicine

## 2013-09-21 ENCOUNTER — Ambulatory Visit
Admission: RE | Admit: 2013-09-21 | Discharge: 2013-09-21 | Disposition: A | Discharge status: Home / Self Care | Payer: BC Managed Care – PPO | Source: Ambulatory Visit | Attending: Family Medicine | Admitting: Family Medicine

## 2013-09-21 VITALS — BP 142/96 | HR 70 | Temp 97.9°F | Resp 20 | Wt 302.7 lb

## 2013-09-21 DIAGNOSIS — I10 Essential (primary) hypertension: Secondary | ICD-10-CM

## 2013-09-21 DIAGNOSIS — Z8042 Family history of malignant neoplasm of prostate: Secondary | ICD-10-CM

## 2013-09-21 DIAGNOSIS — D649 Anemia, unspecified: Secondary | ICD-10-CM

## 2013-09-21 DIAGNOSIS — N400 Enlarged prostate without lower urinary tract symptoms: Principal | ICD-10-CM

## 2013-09-21 LAB — CBC/DIFF
BASOPHILS: 1 %
BASOS ABS: 0.062 THOU/uL (ref 0.000–0.200)
EOS ABS: 0.326 10*3/uL (ref 0.000–0.500)
EOSINOPHIL: 4 %
HCT: 43.7 % (ref 36.7–47.0)
HGB: 14.4 g/dL (ref 12.5–16.3)
LYMPHOCYTES: 34 %
LYMPHS ABS: 2.633 THOU/uL (ref 1.000–4.800)
MCH: 25.7 pg — ABNORMAL LOW (ref 27.4–33.0)
MCHC: 33 g/dL (ref 32.5–35.8)
MCV: 77.7 fL — ABNORMAL LOW (ref 78–100)
MONOCYTES: 7 %
MONOS ABS: 0.536 10*3/uL (ref 0.300–1.000)
MPV: 7.2 fL — ABNORMAL LOW (ref 7.5–11.5)
PLATELET COUNT: 275 10*3/uL (ref 140–450)
PMN ABS: 4.233 THOU/uL (ref 1.500–7.700)
PMN'S: 54 %
RBC: 5.63 MIL/uL (ref 4.06–5.63)
RDW: 22.1 % — ABNORMAL HIGH (ref 12.0–15.0)
WBC: 7.8 THOU/uL (ref 3.5–11.0)

## 2013-09-21 LAB — IRON: IRON: 77 ug/dL (ref 55–175)

## 2013-09-21 LAB — PSA, TOTAL AND FREE, SERUM
FREE PSA RATIO: 28.9 %
FREE PSA: 1.1 ng/mL (ref <2.5)
PROSTATE SPECIFIC AG: 3.8 ng/mL (ref <4.00)

## 2013-09-21 MED ORDER — METOPROLOL TARTRATE 25 MG TABLET
25.00 mg | ORAL_TABLET | Freq: Two times a day (BID) | ORAL | Status: DC
Start: 2013-09-21 — End: 2013-10-25

## 2013-09-21 NOTE — Progress Notes (Signed)
Dr. Angelia Mould in with patient. Erie Noe, LPN

## 2013-09-24 ENCOUNTER — Encounter (HOSPITAL_BASED_OUTPATIENT_CLINIC_OR_DEPARTMENT_OTHER): Payer: Self-pay | Admitting: Family Medicine

## 2013-09-25 NOTE — Progress Notes (Signed)
Subjective:     Patient ID:  Henry [name] is an 58 y.o. male   Chief Complaint:    Chief Complaint   Patient presents with    Anemia    Blood Work Request     follow up for anemia since starting FeSO4       HPI Comments: Henry [name] is a 58 y.o. Male with a history of iron deficiency anemia. He has a brother with prostate cancer diagnosed at age 47.    Current Outpatient Prescriptions:  atorvastatin (LIPITOR) 20 mg Oral Tablet, Take 1 Tab (20 mg total) by mouth Every evening  diclofenac sodium (VOLTAREN) 75 mg Oral Tablet, Delayed Release (E.C.), Take 1 Tab (75 mg total) by mouth Twice daily  dicyclomine (BENTYL) 20 mg Oral Tablet, Take 1 Tab (20 mg total) by mouth Four times a day  ferrous sulfate (FEOSOL) 325 mg (65 mg iron) Oral Tablet, Take 1 Tab (325 mg total) by mouth Three times daily with meals  GLUCOS CHOND CPLX ADVANCED ORAL, qd  M-VIT ORAL, qd  metoprolol (LOPRESSOR) 25 mg Oral Tablet, Take 1 Tab (25 mg total) by mouth Twice daily  metroNIDAZOLE (METROGEL) 1 % Gel with Pump, by Apply externally route Once a day  Omeprazole 20 mg Oral Tablet, Delayed Release (E.C.), take  by mouth.      ALLERGIES:   -- Sulfa (Sulfonamides) -- Rash   -- Shellfish Containing Products -- Itching and Swelling    --  Shrimp - eye swelling, itching            Review of Systems   Constitutional: Negative.    Respiratory: Negative.    Cardiovascular: Negative.    Genitourinary: Negative.    Psychiatric/Behavioral: Negative.      Objective:   Physical Exam   Constitutional: He is oriented to person, place, and time. He appears well-developed and well-nourished.   BP 142/96 mmHg   Pulse 70   Temp(Src) 36.6 C (97.9 F) (Thermal Scan)   Resp 20   Wt 137.3 kg (302 lb 11.1 oz)   SpO2 95%     HENT:   Mouth/Throat: Oropharynx is clear and moist.   Eyes: Pupils are equal, round, and reactive to light.   Neck: Neck supple.   Cardiovascular: Normal rate, regular rhythm and intact distal pulses.    No murmur  heard.  Pulmonary/Chest: Effort normal and breath sounds normal.   Abdominal: Bowel sounds are normal.   Neurological: He is oriented to person, place, and time.   Psychiatric: He has a normal mood and affect.     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) Benign Prostatic Hypertropy  (primary encounter diagnosis)  Plan: PSA, TOTAL AND FREE, SERUM          PROSTATE SPECIFIC ANTIGEN   Lab Results   Component Value Date/Time    PROSSPECAG 3.8 09/21/2013 04:20 PM            (285.9) Anemia  Plan: CBC/DIFF, IRON          CBC  Diff   Lab Results   Component Value Date/Time    WBC 7.8 09/21/2013 04:20 PM    HGB 14.4 09/21/2013 04:20 PM    HCT 43.7 09/21/2013 04:20 PM    PLTCNT 275 09/21/2013 04:20 PM    RBC 5.63 09/21/2013 04:20 PM    MCV 77.7* 09/21/2013 04:20 PM    MCHC 33.0 09/21/2013 04:20 PM  MCH 25.7* 09/21/2013 04:20 PM    RDW 22.1* 09/21/2013 04:20 PM    MPV 7.2* 09/21/2013 04:20 PM    Lab Results   Component Value Date/Time    PMNS 54 09/21/2013 04:20 PM    LYMPHOCYTES 34 09/21/2013 04:20 PM    EOSINOPHIL 4 09/21/2013 04:20 PM    MONOCYTES 7 09/21/2013 04:20 PM    BASOPHILS 1 09/21/2013 04:20 PM    PMNABS 4.233 09/21/2013 04:20 PM    LYMPHSABS 2.633 09/21/2013 04:20 PM    EOSABS 0.326 09/21/2013 04:20 PM    MONOSABS 0.536 09/21/2013 04:20 PM    BASOSABS 0.062 09/21/2013 04:20 PM        Lab Results   Component Value Date/Time    IRONBINDCAP 419* 07/04/2013 04:59 PM    IRONSAT 5* 07/04/2013 04:59 PM         (V16.42) Family hx of prostate cancer  Plan: PSA, TOTAL AND FREE, SERUM          PROSTATE SPECIFIC ANTIGEN   Lab Results   Component Value Date/Time    PROSSPECAG 3.8 09/21/2013 04:20 PM            (401.9) Hypertension  Plan: metoprolol (LOPRESSOR) 25 mg Oral Tablet

## 2013-09-26 ENCOUNTER — Ambulatory Visit (HOSPITAL_BASED_OUTPATIENT_CLINIC_OR_DEPARTMENT_OTHER): Payer: Self-pay | Admitting: Physician Assistant

## 2013-09-26 MED ORDER — AMOXICILLIN 500 MG CAPSULE
ORAL_CAPSULE | ORAL | Status: DC
Start: 2013-09-26 — End: 2013-10-31

## 2013-09-26 NOTE — Telephone Encounter (Addendum)
Prescription for Amoxicillin 2000 mg total po 1 hour prior to dental procedure, dispense 4 capsules with 2 refills.     Elyse Jarvis, PA-C  09/26/2013, 14:06      ----- Message from Ubaldo Glassing sent at 09/26/2013  2:01 PM EDT -----  >> Ubaldo Glassing 09/26/2013 02:01 PM  KLEIN PT    Pt has a dental cleaning Thurs AM, can you call in something?    Thank you          Preferred Pharmacy    [id number] [place] [place]   [address] [address]   Phone: [phone]: [phone]   Open 24 Hours?: No

## 2013-10-03 ENCOUNTER — Ambulatory Visit (HOSPITAL_BASED_OUTPATIENT_CLINIC_OR_DEPARTMENT_OTHER): Payer: BC Managed Care – PPO | Admitting: Internal Medicine

## 2013-10-03 DIAGNOSIS — G4733 Obstructive sleep apnea (adult) (pediatric): Secondary | ICD-10-CM

## 2013-10-03 NOTE — Progress Notes (Signed)
Chief Complaint: Henry [name] [facility] presents today for follow up for OSA.   Sleep Disorders Therapy   Positive Pressure: CPAP 7 cm    Mask: Nasal   Humidifier: Heated   Ramp: Yes   Oxygen:No   History of the Present Illness: The patient is a 58 y.o. year old male with severe Sleep Apnea presenting for routine follow-up. No concerns or complaints today. Doing well on currently therapy.     Apnea hypopnea index: 53.8 events per hour    Current use of positive pressure therapy: Nightly, throughout the night while sleep   Hours of sleep per night: 5-6 hours    Problems with therapy:   Mask: denied    Machine: denied    Nasal: denied    Epworth Sleepiness Scale: 4   Past Medical History   Diagnosis Date    HTN     BPH 11/06/2005    Benign Prostatic Hypertropy 09/22/2000    Sleep apnea     CPAP (continuous positive airway pressure) dependence      settings unknown    Hypertension     Hyperlipidemia     GERD (gastroesophageal reflux disease)      well controlled on omeprazole    Heartburn       No outpatient prescriptions have been marked as taking for the 10/03/13 encounter (Appointment) with Weldon Picking, MD.     Physical Examination:   BP 130/80 mmHg   Pulse 66   Temp(Src) 36.7 C (98 F)   Resp 12   Ht 1.753 m (5' 9" )   Wt 138.1 kg (304 lb 7.3 oz)   BMI 44.94 kg/m2   SpO2 96%  General appearance - alert, no distress   Nose/Sinuses - Nares normal, septum midline, mucosa normal, no drainage or sinus tenderness   Oropharynx - Oropharynx Mallampati class III and Hard palate is normal   Neck - Circumference 50 cm @ cirsothyroid membrane and neck supple   Lungs - Clear to auscultation bilaterally and Chest symmetric with normal A/P diameter   Heart - S1,S2 regular. No murmurs, gallops, or rubs.   Extremities - No edema or skin discoloration    Neuro - Glossy intact and Gait normal     Data download:   Did not bring SD card    Assessment and Plans:   1. Obstructive sleep apnea   2. Morbid Obesity   * Excellent  subjective compliance   * Clinically apnea seems well controlled   * Continues to have an excellent response to therapy   * Advise obtain replacement equipment every six months.   * Fitness and weight loss encouraged  * RTC 1 year      Weldon Picking, MD

## 2013-10-17 ENCOUNTER — Ambulatory Visit (HOSPITAL_BASED_OUTPATIENT_CLINIC_OR_DEPARTMENT_OTHER): Payer: Self-pay | Admitting: Physician Assistant

## 2013-10-17 NOTE — Telephone Encounter (Addendum)
Returned patient call. Over the last couple of weeks he reports discomfort when sitting on hard surfaces, especially metal or wooden chairs. He has associated tingling. It is uncomfortable and he has to switch positions but he would consider it more of a nuisance. He also reports an unusual feeling just under the incision. He thinks he feels something "moving". This only occurs when he shifts his weight to the other leg. He has no pain or a sensation of movement in the hip with walking and other activities. Possible explanation for the discomfort with sitting may be SI joint dysfunction/spine. I suggested he try stretching exercises, core strengthening, heating pad and anti-inflammatories. 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 If he is concerned, I strongly suggested he arrange a follow-up visit for further evaluation. He prefers to wait this out for now. He will call if something worsens or changes.    Elyse Jarvis, PA-C  10/17/2013, 17:10      ----- Message from Deberah Castle sent at 10/17/2013 12:44 PM EDT -----  Regarding: Patient Call  >> Deberah Castle 10/17/2013 12:44 PM  Pt called to speak with Dr. Caryl Comes regarding right hip pain - he states that when he sits/drives, there is additional pain where his hip touches the seat surface. He also describes a "stinging" sensation internally behind his incision. Lastly, he states he feels the prosthesis "moving" or is more aware of the hip components and he would like to know what is considered normal. Please call [phone]. Thank you!    -Kathlee Nations

## 2013-10-25 ENCOUNTER — Other Ambulatory Visit (HOSPITAL_BASED_OUTPATIENT_CLINIC_OR_DEPARTMENT_OTHER): Payer: Self-pay | Admitting: Family Medicine

## 2013-10-25 DIAGNOSIS — I10 Essential (primary) hypertension: Secondary | ICD-10-CM

## 2013-10-25 MED ORDER — METOPROLOL TARTRATE 25 MG TABLET
25.0000 mg | ORAL_TABLET | Freq: Two times a day (BID) | ORAL | Status: DC
Start: 2013-10-25 — End: 2013-11-22

## 2013-10-31 ENCOUNTER — Encounter (HOSPITAL_BASED_OUTPATIENT_CLINIC_OR_DEPARTMENT_OTHER): Payer: Self-pay | Admitting: Family Medicine

## 2013-10-31 ENCOUNTER — Ambulatory Visit: Payer: BC Managed Care – PPO | Attending: Family Medicine | Admitting: Family Medicine

## 2013-10-31 VITALS — BP 128/82 | HR 65 | Temp 97.7°F | Ht 68.66 in | Wt 300.3 lb

## 2013-10-31 DIAGNOSIS — I10 Essential (primary) hypertension: Secondary | ICD-10-CM | POA: Insufficient documentation

## 2013-10-31 DIAGNOSIS — E7800 Pure hypercholesterolemia, unspecified: Secondary | ICD-10-CM | POA: Insufficient documentation

## 2013-10-31 DIAGNOSIS — D229 Melanocytic nevi, unspecified: Secondary | ICD-10-CM

## 2013-10-31 DIAGNOSIS — D509 Iron deficiency anemia, unspecified: Secondary | ICD-10-CM | POA: Insufficient documentation

## 2013-10-31 DIAGNOSIS — E611 Iron deficiency: Secondary | ICD-10-CM

## 2013-10-31 DIAGNOSIS — Z6841 Body Mass Index (BMI) 40.0 and over, adult: Secondary | ICD-10-CM

## 2013-10-31 DIAGNOSIS — D2330 Other benign neoplasm of skin of unspecified part of face: Secondary | ICD-10-CM | POA: Insufficient documentation

## 2013-10-31 NOTE — Progress Notes (Signed)
Subjective:     Patient ID:  Henry [name] is an 58 y.o. male   Chief Complaint:    Chief Complaint   Patient presents with    Medication Check       HPI Comments: Following for HTN, low iron and fatigue, weight, and has questions about a mole on his left side forehead. Mole not painful, been present for months. There was associated bleeding when he struck his head on an object.    Current Outpatient Prescriptions:  aspirin (ECOTRIN) 81 mg Oral Tablet, Delayed Release (E.C.), Take 81 mg by mouth Once a day  atorvastatin (LIPITOR) 20 mg Oral Tablet, Take 1 Tab (20 mg total) by mouth Every evening  diclofenac sodium (VOLTAREN) 75 mg Oral Tablet, Delayed Release (E.C.), Take 1 Tab (75 mg total) by mouth Twice daily  dicyclomine (BENTYL) 20 mg Oral Tablet, Take 1 Tab (20 mg total) by mouth Four times a day  ferrous sulfate (FEOSOL) 325 mg (65 mg iron) Oral Tablet, Take 1 Tab (325 mg total) by mouth Three times daily with meals  GLUCOS CHOND CPLX ADVANCED ORAL, qd  M-VIT ORAL, qd  metoprolol (LOPRESSOR) 25 mg Oral Tablet, Take 1 Tab (25 mg total) by mouth Twice daily  metroNIDAZOLE (METROGEL) 1 % Gel with Pump, by Apply externally route Once a day  Omeprazole 20 mg Oral Tablet, Delayed Release (E.C.), take  by mouth.      ALLERGIES:   -- Sulfa (Sulfonamides) -- Rash   -- Shellfish Containing Products -- Itching and Swelling    --  Shrimp - eye swelling, itching          Review of Systems   Constitutional: Negative.    HENT: Positive for sinus pressure.         Congestion and cold symptoms.   Cardiovascular: Negative.    Gastrointestinal: Negative.    Neurological: Negative.    Psychiatric/Behavioral: Negative.      Objective:   Physical Exam   Constitutional: He is oriented to person, place, and time. He appears well-developed and well-nourished.   BP 128/82 mmHg   Pulse 65   Temp(Src) 36.5 C (97.7 F) (Thermal Scan)   Ht 1.744 m (5' 8.66")   Wt 136.2 kg (300 lb 4.3 oz)   BMI 44.78 kg/m2     Cardiovascular:  Normal rate and regular rhythm.  Exam reveals no gallop and no friction rub.    No murmur heard.  Pulmonary/Chest: Effort normal and breath sounds normal. No respiratory distress.   Abdominal: Bowel sounds are normal.   Musculoskeletal: He exhibits no edema.   Neurological: He is alert and oriented to person, place, and time.   Psychiatric: He has a normal mood and affect.     Ortho Exam    Assessment & Plan:     (401.9) Hypertension  (primary encounter diagnosis)  Plan: Repeat B/P good    (272.0) Hypercholesterolemia  Plan:   Lab Results   Component Value Date    CHOLESTEROL 159 05/28/2011    HDLCHOL 40 05/28/2011    LDLCHOL 87 05/28/2011    LDLCHOLDIR 65 07/04/2013    TRIG 161* 05/28/2011          (280.9) Low iron  Plan:   Lab Results   Component Value Date/Time    IRONBINDCAP 419* 07/04/2013 04:59 PM    IRONSAT 5* 07/04/2013 04:59 PM     Fatigue resolved Iron level now normal. Continue replacrment.

## 2013-11-13 DIAGNOSIS — F331 Major depressive disorder, recurrent, moderate: Secondary | ICD-10-CM | POA: Diagnosis not present

## 2013-11-13 DIAGNOSIS — M545 Low back pain, unspecified: Secondary | ICD-10-CM | POA: Diagnosis not present

## 2013-11-13 DIAGNOSIS — G894 Chronic pain syndrome: Secondary | ICD-10-CM | POA: Diagnosis not present

## 2013-11-13 DIAGNOSIS — M542 Cervicalgia: Secondary | ICD-10-CM | POA: Diagnosis not present

## 2013-11-17 ENCOUNTER — Other Ambulatory Visit: Payer: Self-pay

## 2013-11-22 ENCOUNTER — Other Ambulatory Visit (HOSPITAL_BASED_OUTPATIENT_CLINIC_OR_DEPARTMENT_OTHER): Payer: Self-pay | Admitting: Family Medicine

## 2013-11-22 NOTE — Telephone Encounter (Signed)
-----   Message from [place] sent at 11/22/2013 10:45 AM EDT -----  Regarding: Needs new 90 day RX   >> PAMELA LONG 11/22/2013 10:45 AM  Angelia Mould pt    Pt states his insurance sent him a letter stating he needs a new 90 day RX for metoprolol sent to his mail order pharmacy.  Thanks    metoprolol (LOPRESSOR) 25 mg Oral Tablet 180 Tab 3 10/25/2013  NEEDS TO BE A 90 DAY SUPPLY   Sig - Route: Take 1 Tab (25 mg total) by mouth Twice daily - Oral      [facility]erred [facility]acy     [facility] [place] [place] [address]    Phone: [phone] Fax: [phone]    Open 24 Hours?: No

## 2013-11-23 MED ORDER — METOPROLOL TARTRATE 25 MG TABLET
25.0000 mg | ORAL_TABLET | Freq: Two times a day (BID) | ORAL | Status: DC
Start: 2013-11-22 — End: 2016-01-27

## 2013-11-27 NOTE — Telephone Encounter (Signed)
E-rxd

## 2014-01-01 ENCOUNTER — Encounter (HOSPITAL_BASED_OUTPATIENT_CLINIC_OR_DEPARTMENT_OTHER): Payer: Self-pay | Admitting: Family

## 2014-01-01 ENCOUNTER — Ambulatory Visit: Payer: BC Managed Care – PPO | Attending: Family Medicine | Admitting: Family

## 2014-01-01 VITALS — BP 136/80 | HR 75 | Temp 98.2°F | Ht 68.82 in | Wt 302.3 lb

## 2014-01-01 DIAGNOSIS — Z6841 Body Mass Index (BMI) 40.0 and over, adult: Secondary | ICD-10-CM

## 2014-01-01 DIAGNOSIS — H109 Unspecified conjunctivitis: Secondary | ICD-10-CM | POA: Insufficient documentation

## 2014-01-01 DIAGNOSIS — J069 Acute upper respiratory infection, unspecified: Secondary | ICD-10-CM | POA: Insufficient documentation

## 2014-01-01 MED ORDER — BENZONATATE 200 MG CAPSULE
200.00 mg | ORAL_CAPSULE | Freq: Three times a day (TID) | ORAL | Status: DC
Start: 2014-01-01 — End: 2014-04-26

## 2014-01-01 MED ORDER — MOXIFLOXACIN 0.5 % EYE DROPS
1.00 [drp] | Freq: Three times a day (TID) | OPHTHALMIC | Status: DC
Start: 2014-01-01 — End: 2014-04-26

## 2014-01-01 NOTE — Progress Notes (Addendum)
Department of Family Medicine   Henry [name]  DOB: 29-Aug-1955, 58 y.o.   MRN: [id number]  Date of encounter: 01/01/2014    Chief Complaint   Patient presents with    Pink Eye     Has drainage and itches, started on Saturday    Coughing    Chest Congestion    Life Style Modification     none       Subjective:  Patient presents today for possible pink eye and he has a cold. The cold started this past Friday and his irritation in his eye started Saturday. Denies fever, SOB, chest pain, or sinus pressure. His right eye is draining and has had some crusting in the mornings. He states that it is a little itchy as well. He does wear contacts, but has been unable to do so since Saturday. His wife is a 3rd grade teacher and he thinks that she may have brought pink eye home from work. He does reports that right eye vision is a little blurry but he is unable to wear his contacts on that side. He also reports sore throat, productive cough at times, but mostly dry, and some chest congestion. He took Human resources officer for his cold symptoms which he reports is helping some.    Review of Systems:  10 point ROS negative except for that in HPI    Allergies   Allergen Reactions    Sulfa (Sulfonamides) Rash    Shellfish Containing Products Itching and Swelling     Shrimp - eye swelling, itching     Current Outpatient Prescriptions   Medication Sig    aspirin (ECOTRIN) 81 mg Oral Tablet, Delayed Release (E.C.) Take 81 mg by mouth Once a day    atorvastatin (LIPITOR) 20 mg Oral Tablet Take 1 Tab (20 mg total) by mouth Every evening    diclofenac sodium (VOLTAREN) 75 mg Oral Tablet, Delayed Release (E.C.) Take 1 Tab (75 mg total) by mouth Twice daily    dicyclomine (BENTYL) 20 mg Oral Tablet Take 1 Tab (20 mg total) by mouth Four times a day    ferrous sulfate (FEOSOL) 325 mg (65 mg iron) Oral Tablet Take 1 Tab (325 mg total) by mouth Three times daily with meals    GLUCOS CHOND CPLX ADVANCED ORAL qd    M-VIT ORAL qd     metoprolol (LOPRESSOR) 25 mg Oral Tablet Take 1 Tab (25 mg total) by mouth Twice daily    metroNIDAZOLE (METROGEL) 1 % Gel with Pump by Apply externally route Once a day    Omeprazole 20 mg Oral Tablet, Delayed Release (E.C.) take  by mouth.     Medical, Surgical, Family, Social history reviewed, and the appropriate changes have been made in the EMR    Objective:  BP 136/80 mmHg   Pulse 75   Temp(Src) 36.8 C (98.2 F) (Thermal Scan)   Ht 1.748 m (5' 8.82")   Wt 137.1 kg (302 lb 4 oz)   BMI 44.87 kg/m2  Constitutional: Alert and in no acute distress.   HEENT: PERRLA. Right conjunctiva erythematous that extends throughout right eye. No laceration present with fluorescene applied and observed with wood lamp. Neck supple. No thyromegaly present. No carotid bruit.No lymphadenopathy or tonsillar edema present. Oropharynx clear with no exudates present. No maxillary or frontal sinus tenderness. TMs clear bilaterally.   Cardiovascular: Normal rate, regular rhythm, no murmer appreciated.    Pulm:  Effort normal and breath sounds normal. There are no wheezes appreciated.  Skin: Skin is warm and dry. No rash noted.     Assessment/Plan:  (H10.9) Conjunctivitis  (primary encounter diagnosis)  Plan:   Vigamox to right eye TID until clear    (J06.9) Viral URI  Plan:   Tessalon TID PRN for sore throat  Cont Allegra daily--advised to avoid sudafed  Okay to use Aleve for pain    RTC should symptoms worsen or fail to improve  Patient seen independently with co-signing physician present in clinic.  Helayne Seminole, APRN  01/01/2014, 09:14          Mackie Pai, MD  Associate Professor  Director, Division of Sports Medicine  [facility]  Operated by [facility]

## 2014-01-19 ENCOUNTER — Other Ambulatory Visit (HOSPITAL_BASED_OUTPATIENT_CLINIC_OR_DEPARTMENT_OTHER): Payer: Self-pay | Admitting: Family Medicine

## 2014-01-19 ENCOUNTER — Encounter (HOSPITAL_BASED_OUTPATIENT_CLINIC_OR_DEPARTMENT_OTHER): Payer: Self-pay | Admitting: Family Medicine

## 2014-01-19 NOTE — Progress Notes (Signed)
Documented flu vaccine given at [facility] [phone] on 01/17/2014.  Dorathy Kinsman, [place]

## 2014-01-20 MED ORDER — DICYCLOMINE 20 MG TABLET
20.0000 mg | ORAL_TABLET | Freq: Four times a day (QID) | ORAL | Status: DC
Start: 2014-01-20 — End: 2015-01-11

## 2014-02-20 ENCOUNTER — Encounter (HOSPITAL_BASED_OUTPATIENT_CLINIC_OR_DEPARTMENT_OTHER): Payer: BC Managed Care – PPO | Admitting: Family Medicine

## 2014-04-17 ENCOUNTER — Encounter (HOSPITAL_BASED_OUTPATIENT_CLINIC_OR_DEPARTMENT_OTHER): Payer: Self-pay | Admitting: Orthopaedic Surgery

## 2014-04-17 ENCOUNTER — Ambulatory Visit (HOSPITAL_BASED_OUTPATIENT_CLINIC_OR_DEPARTMENT_OTHER): Payer: BC Managed Care – PPO | Admitting: Orthopaedic Surgery

## 2014-04-17 ENCOUNTER — Ambulatory Visit
Admission: RE | Admit: 2014-04-17 | Discharge: 2014-04-17 | Disposition: A | Discharge status: Home / Self Care | Payer: BC Managed Care – PPO | Source: Ambulatory Visit | Attending: Orthopaedic Surgery | Admitting: Orthopaedic Surgery

## 2014-04-17 ENCOUNTER — Encounter (HOSPITAL_COMMUNITY): Payer: Self-pay

## 2014-04-17 VITALS — Temp 97.0°F | Ht 69.0 in | Wt 304.5 lb

## 2014-04-17 DIAGNOSIS — M25552 Pain in left hip: Secondary | ICD-10-CM

## 2014-04-17 DIAGNOSIS — M47812 Spondylosis without myelopathy or radiculopathy, cervical region: Secondary | ICD-10-CM

## 2014-04-17 DIAGNOSIS — Z96641 Presence of right artificial hip joint: Secondary | ICD-10-CM

## 2014-04-17 DIAGNOSIS — M1612 Unilateral primary osteoarthritis, left hip: Secondary | ICD-10-CM

## 2014-04-17 DIAGNOSIS — Z9660 Presence of unspecified orthopedic joint implant: Secondary | ICD-10-CM | POA: Insufficient documentation

## 2014-04-17 DIAGNOSIS — Z6841 Body Mass Index (BMI) 40.0 and over, adult: Secondary | ICD-10-CM | POA: Insufficient documentation

## 2014-04-17 DIAGNOSIS — Z96649 Presence of unspecified artificial hip joint: Secondary | ICD-10-CM

## 2014-04-17 DIAGNOSIS — Z96642 Presence of left artificial hip joint: Secondary | ICD-10-CM

## 2014-04-17 DIAGNOSIS — M542 Cervicalgia: Secondary | ICD-10-CM | POA: Insufficient documentation

## 2014-04-17 DIAGNOSIS — M5412 Radiculopathy, cervical region: Secondary | ICD-10-CM

## 2014-04-17 NOTE — Ancillary Notes (Addendum)
[facility] Record for: Ananias, Kolander  Created: 04/17/2014 9:58:23 AM  MRN: [id number]  DOB: February 03, 1956  SSN: [id number]  Sex: Male  Height:  Feet  Inches - Weight:   [facility] Name:   Address:   [address]: [place], [place] [address]  E-Mail:   Day Phone: [phone] Night Phone:  Other Phone: [phone]  Phone comments:   Call Back time:   Authorized contact:   Intake Date: 04/17/2014 9:58:23 AM  PCP:    RefMD: Caryl Comes MD, Adam   Primary Insurance: BCBS PPO  Secondary Insurance:   Insurance Comments:      -------Referral Assignment------  Where was the original referral directed?: [facility]  Was a specific reviewer requested?: Unassigned Referral  How was the reviewer selected?: Rotation  Who requested the specific reviewer?:   How did you hear of our spine program?:   Is this a second opinion?:      -------Field seismologist----------  [employer]:   City of Birth:      ---------- 1st Review ----------     Review Date:   Review Completed by:   Impression:   Disposition:   Appt w/ Colleague:   Colleague Name:   Appt How Soon:   Pre Treatment Type:   Pre Treatment Type Details:   Pre Treatment Other Test:   Appt Type:   Instructions:      ---------- Symptoms ----------     Chief complaint:   Diagnosis from Other MD:      Symptoms:   Other Symptom Description:   Pain Location:   Other Pain Location Description:   Where is your pain the worst?:   Pain Type:   Pain Rating:   Does the pain radiate to other parts of your body?    Radiate Where:   Other Description:   Does it radiate to the fingers?   Does it radiate below the elbow?   Which specific part of your arm?   Which fingers?   Which part of your arm does pain go to?   How does it radiate to the arm?   Does it radiate to the toes?   Does it radiate below the knee?   Which specific part of your leg?   Which toes?   Which part of your leg does pain go to?   How does it radiate to the leg?   Additional pain information         Location of Numbness/tingling:   Other Description:  Does numbness/tingling radiate to other parts of your body?:  Where does the numbess/tingling radiate?:  Other Description:  Does the numbness/tingling radiate below your elbow?:  Which specific part of your arm?:  Which fingers:  Which pat of your arm does the numbness/tingling go to?:  How does the numbness/tingling radiate to your arm?:  Does the numbness/tingling radiate below your knee?:  Which specific part of your leg?:  Which toes:  Which part of your leg does the numbness/tingling go to?:  How does the numbness/tingling radiate to your leg?:  Additional Numbness/tingling information:  Other Description:      Location of Weakness:   Other Description:   Additional Weakness Information:      The symptoms have been present for:   The symptoms began:   Was there a specific event that caused your symptoms?:   Additional Narrative Description:   Are you able to perform your daily activities with these symptoms?:  Since what date have you been unable to perform your daily routine?:   The symptoms improve when you:   Other activities that improve your symptoms:   The symptoms worsen when you:   Other activities that worsen your symptoms:      ---------- Work History ----------     Are you able to work?:   Reasons for not working:   Other Reason for not working:   Do you have Work Restrictions:   If applicable, maximum lifting restriction:   How long have you been unable to work:   Have you ever filed a W/C claim related to a neck or back injury?:   Occupation:   Other Occupation Information:      ---------- Bowel/Bladder/Incontinence Issues ----------     Since the onset of symptoms, have you experienced any new problems urinating or having bowel movements?:   Description:   Other Description:   How long have you had these bowel/bladder problems?:      ---------- Allergies ----------     Do you have any medication allergies?   Allergies:   Other Details:    Allergic to Latex?:   Allergic to intravenous contrast dyes?:   Allergic to steroids?:      ---------- Treatment/Testing ----------      Taking prescription medication for this problem?:   Are you using any now?:   Have you received a Medrol dose pack for this problem?:   When did you last take the dosepack?:   Were your symptoms improved?:   Would you describe your relief as:   How long did you experience that amount of relief?:   Other Medrol dosepack information:      --------------- PT ---------------     PT:   When Received:   Where Received:   Other where received information:   Visits:   Types:   Other Types:   Improved:      If improved, describe level of relief:   If improved, how long did you experience relief:   Other Physical Therapy Treatment Information:      ---------- Chiropractic Services ----------     Chiro:   When:   Who:   Visits:   Types:   Other Types:   Were Symptoms Improved:   If improved, describe level of relief:   If improved, how long did you experience relief:   Other Chiropractic Treatment Information:      --------------- ESI ---------------     ESI:   When:   Who:   Visits:   Types:   Improved:   If improved, describe level of relief:   If improved, how long did you experience relief:   Other Injection Treatment Information:      ---------- Diagnostic Tests ----------     Plain spine X-rays On:07/07/2015MR  [id number] Where: [facility] PACS  Area(s) Scanned: Cervical  x-ray [facility], X-ray joint cetner hip series right On:03/01/201607/08/2013 Where: [facility] PACS   Do you have any of the following in case an MRI is ordered?:   Other MRI factors:      ---------- Past Medical History ----------     What other doctors/providers have treated you for these spine issues?     Ever diagnosed with Spine deformity?:   Prior neck or back surgery (1)?:   When:   Who:   Area of neck/spine operated on:   Level:   Were symptoms improved?:   If improved, describe level of relief:  If  improved, how long did you experience relief?:   Prior neck or back surgery (2)?:   When:   Who:   Area of neck/spine operated on:   Level:   Were symptoms improved?:   If improved, describe level of relief:  If improved, how long did you experience relief?:   Prior neck or back surgery (3)?:   When:   Who:   Area of neck/spine operated on:   Level:   Were symptoms improved?:   If improved, describe level of relief:  If improved, how long did you experience relief?:   Do you have any of the following assistive devices?   How long have you required these assistive devices?   Currently being treated for any other medical condition?:   Conditions:   Other Conditions:   Type of neurologic disorder:   Cancer Type:   Other Treatment/Medication:   What blood thinners are you currently taking?:   Which physicians are treating you for your other medical conditions?:   Do you smoke?:   Are you pre-menopausal or post-menopausal?:   If recommended, are you willing to consider surgery?:   What is your goal in seeking treatment?:   Other pertinent information/ general comments/ goals for treatment:      ---------- Care Coordinator Information ----------     Care Coordinator:   Activity Log:      Letter/Test Coordinator: Letters  Letter/Test Coordinator Log: 04/18/2014 8:32:03 AM Lowell Bouton Sencindiver]: Attempted to reach patient he answered but then his phone went silent will try calling back later.JSencindiver 04/18/2014 9:03:54 AM Malachy Mood Mailin Coglianese]: Spoke with patient and scheduled appointment with Dr. Aldona Bar for 06/07/2014 at  08:00 am. CChidester.     Intake Specialist Comments:      Clinic Staff Comments:      Last Edited byUla Lingo on 04/18/2014 9:32:41 AM  Last Review by:  on

## 2014-04-18 ENCOUNTER — Ambulatory Visit (HOSPITAL_BASED_OUTPATIENT_CLINIC_OR_DEPARTMENT_OTHER): Payer: Self-pay | Admitting: Family Medicine

## 2014-04-18 NOTE — Telephone Encounter (Signed)
-----   Message from Chase sent at 04/18/2014  9:06 AM EST -----  Regarding: lab order prior to apt?  >> PAMELA LONG 04/18/2014 09:06 AM  Angelia Mould pt    Pt made an apt to follow up for his iron on March 10th, and wants to know if the Dr wants to order labs prior to the apt. Please call pt when order is in place, if so. Thanks

## 2014-04-18 NOTE — Progress Notes (Addendum)
[place], [place] [address]                                PATIENT NAME: Henry [name], Henry [name] [facility] [id number]  DATE OF SERVICE:04/17/2014  DATE OF BIRTH: September 04, 1955    PROGRESS NOTE    SUBJECTIVE:  [name] is a 59 year old gentleman nearly 1 year status post right total hip arthroplasty.  He returns to clinic today reporting increased left hip pain.  He remains very pleased with his right hip and denies any complaints on that side.  Over the last several months, he has experienced gradual onset of left hip pain with symptoms very similar to his preoperative symptoms of the contralateral hip.  His symptoms have substantially increased over the last couple of weeks.  He states that he does okay up until the afternoon.  His pain is aggravated with prolonged standing and walking.  The pain is localized to the side of the hip with associated symptoms of burning.  He denies any groin, buttock or pain that radiates down the leg.  Today, he rates the pain as a 6/10 on the pain scale.  He currently takes Voltaren 75 mg twice daily as needed.  He is not currently using any assistive devices. In addition to the hip, he reports neck pain with numbness and tingling in the right lower extremity with certain movements.  The neck is more problematic than the hip.  He is wondering if we could refer him to someone for further evaluation.     OBJECTIVE:  On exam today, the patient is a well-appearing in no acute distress.  Mood and affect are appropriate for clinic.  Weight 138.1 kg and height 5 feet 9 inches.  BMI is 45.  Evaluation of the right hip reveals a well-healed incision.  Range of motion of the right hip is 80 degrees of flexion, 2 degrees internal rotation, 10 degrees external rotation, 20 degrees of abduction and 10 degrees of adduction.  Range of motion does not  elicit any discomfort.  Evaluation of the left hip reveals flexion to 85 degrees, 2 degrees internal rotation, 40 degrees external rotation, 45 degrees abduction and 10 degrees adduction.  He denies any tenderness to palpation of the greater trochanteric bursa.  He has 5/5 motor strength in the bilateral lower extremities with sensation intact to light touch bilaterally.  He has a positive Spurling test's with extension and axial loading. Neck distraction relieves symptoms.  He is neurovascularly intact with testing of the bilateral upper extremities with 5/5 motor strength.    IMAGING:  Bilateral hip radiographs obtained at today's visit were reviewed in PACS and reveals a well-aligned, well-fixed right total hip arthroplasty.  He has moderate arthrosis of the left hip with some associated joint space narrowing and subchondral cyst formation.  Previous C-spine radiographs obtained in July 2015 were reviewed and reveal significant degenerative changed with disk space loss at C5 through C7 with facet arthropathy and grade I anterolisthesis of C7-T1.    ASSESSMENT:  1.  Status  post satisfactory right total hip arthroplasty.  2.  Moderate left hip arthrosis.  3.  Cervical spine arthritis with right-sided radiculopathy.  4.  Morbid obesity.     PLAN:  The patient remains very pleased with his right total hip.  He is now experiencing symptoms in the left hip which are very similar to his pre-operative symptoms on the right. Unlike the right hip, the arthritis and symptoms are not yet as severe. Our preference is to manage this conservatively for awhile longer. He will continue use of Voltaren 75 mg twice daily with routine monitoring of kidney function. A left hip injection was also ordered today. Someone will be in touch with him once it has been approved by his insurance. If effective, injections can be repeated every 3 months. Weight loss was strongly encouraged.  We set a weight loss goal of at least 50 pounds prior  to a future hip replacement.  For further evaluation of his neck, we referred him to Dr. Radford Pax in the spine center. He will return to our clinic in about 3 months' time for re-evaluation.       Elyse Jarvis, PA-C  [facility] of Orthopaedics    Jacqualine Code, MD  Assistant Professor  [facility] Department of Orthopaedics    DX/IPJ/[id number]; D: 04/17/2014 12:38:43; T: 04/18/2014 76:73:41    cc: Rosemarie Beath MD      INBASKET          Satisfactory right THA - moderate left hip OA.  Some L spine issues - appreciate non op spine input.  I personally saw and examined the patient. See physician's assistant note for additional details.     Jacqualine Code, MD

## 2014-04-19 ENCOUNTER — Ambulatory Visit (HOSPITAL_BASED_OUTPATIENT_CLINIC_OR_DEPARTMENT_OTHER): Payer: Self-pay | Admitting: Physician Assistant

## 2014-04-19 DIAGNOSIS — M5412 Radiculopathy, cervical region: Secondary | ICD-10-CM

## 2014-04-19 DIAGNOSIS — M47812 Spondylosis without myelopathy or radiculopathy, cervical region: Secondary | ICD-10-CM

## 2014-04-19 NOTE — Telephone Encounter (Addendum)
Returned patient call. He is not scheduled to see Dr. Aldona Bar for evaluation of his neck until April 21st. I did send out a message requesting that he be seen sooner if possible. He is desperate for some relief. He tried PT last year and that seemed to help. I suggested we try that again. He was agreeable. I faxed a PT order to [facility] at the [facility], [place] location.     Elyse Jarvis, PA-C  04/19/2014, 14:53      ----- Message from Odis Hollingshead sent at 04/19/2014  1:06 PM EST -----  >> Odis Hollingshead 04/19/2014 01:06 PM  Caryl Comes Patient    Patient called, stated he has an appointment to see Dr. Aldona Bar about his neck but it's not until April 21st. Patient said that's a long time for him to wait and wants to know if there are exercises that Dr. Caryl Comes could advise him on to help with his pain until his appointment. Please call patient to advise.

## 2014-04-20 ENCOUNTER — Other Ambulatory Visit (HOSPITAL_BASED_OUTPATIENT_CLINIC_OR_DEPARTMENT_OTHER): Payer: Self-pay | Admitting: Family Medicine

## 2014-04-20 DIAGNOSIS — D649 Anemia, unspecified: Secondary | ICD-10-CM

## 2014-04-25 ENCOUNTER — Ambulatory Visit
Admission: RE | Admit: 2014-04-25 | Discharge: 2014-04-25 | Disposition: A | Discharge status: Home / Self Care | Payer: BC Managed Care – PPO | Source: Ambulatory Visit | Attending: Family Medicine | Admitting: Family Medicine

## 2014-04-25 DIAGNOSIS — D649 Anemia, unspecified: Secondary | ICD-10-CM | POA: Insufficient documentation

## 2014-04-25 LAB — IRON: IRON: 70 ug/dL (ref 55–175)

## 2014-04-26 ENCOUNTER — Encounter (HOSPITAL_BASED_OUTPATIENT_CLINIC_OR_DEPARTMENT_OTHER): Payer: Self-pay | Admitting: Family Medicine

## 2014-04-26 ENCOUNTER — Ambulatory Visit: Payer: BC Managed Care – PPO | Attending: Family Medicine | Admitting: Family Medicine

## 2014-04-26 VITALS — BP 136/82 | HR 76 | Temp 97.9°F | Ht 68.82 in | Wt 304.0 lb

## 2014-04-26 DIAGNOSIS — Z8042 Family history of malignant neoplasm of prostate: Secondary | ICD-10-CM | POA: Insufficient documentation

## 2014-04-26 DIAGNOSIS — D509 Iron deficiency anemia, unspecified: Secondary | ICD-10-CM | POA: Insufficient documentation

## 2014-04-26 DIAGNOSIS — Z6841 Body Mass Index (BMI) 40.0 and over, adult: Secondary | ICD-10-CM

## 2014-04-26 DIAGNOSIS — E611 Iron deficiency: Secondary | ICD-10-CM

## 2014-04-26 DIAGNOSIS — Z7982 Long term (current) use of aspirin: Secondary | ICD-10-CM | POA: Insufficient documentation

## 2014-04-26 DIAGNOSIS — Z96641 Presence of right artificial hip joint: Secondary | ICD-10-CM | POA: Insufficient documentation

## 2014-04-26 DIAGNOSIS — M5090 Cervical disc disorder, unspecified, unspecified cervical region: Secondary | ICD-10-CM | POA: Insufficient documentation

## 2014-04-26 MED ORDER — FERROUS SULFATE ER 142 MG (45 MG IRON) TABLET,EXTENDED RELEASE
1.00 | ORAL_TABLET | Freq: Three times a day (TID) | ORAL | Status: DC | PRN
Start: 2014-04-26 — End: 2021-04-15

## 2014-04-26 NOTE — Progress Notes (Signed)
Subjective:     Patient ID:  Henry [name] is an 59 y.o. male   Chief Complaint:    Chief Complaint   Patient presents with    Low Iron       HPI Comments: PATIENT NAME: Henry [name], Henry [name] [facility] CLEXNT:[id number]  DATE OF SERVICE:04/26/2014  DATE OF BIRTH: 1955-09-30    PROGRESS NOTE    HISTORY OF PRESENT ILLNESS:  This 59 year old gentleman is here following up for low iron.  He also has a history of a right total hip replacement by Dr.  Caryl Comes.  When also seen by orthopaedics he had complaints of neck pain and was referred to the spine center for that problem.  He has not been tolerating his iron supplement, complaining of nausea and abdominal pain.  A consultation with the departmental pharmacist and discussions resulted in a change in medication today and he will attempt his iron replacement with slow iron b.i.d with a repeat iron in about 3 months.        ALLERGIES:   -- Sulfa (Sulfonamides) -- Rash   -- Shellfish Containing Products -- Itching and Swelling    --  Shrimp - eye swelling, itching    Current Outpatient Prescriptions:  aspirin (ECOTRIN) 81 mg Oral Tablet, Delayed Release (E.C.), Take 81 mg by mouth Once a day  atorvastatin (LIPITOR) 20 mg Oral Tablet, Take 1 Tab (20 mg total) by mouth Every evening  diclofenac sodium (VOLTAREN) 75 mg Oral Tablet, Delayed Release (E.C.), Take 1 Tab (75 mg total) by mouth Twice daily  dicyclomine (BENTYL) 20 mg Oral Tablet, Take 1 Tab (20 mg total) by mouth Four times a day  ferrous sulfate (FEOSOL) 325 mg (65 mg iron) Oral Tablet, Take 1 Tab (325 mg total) by mouth Three times daily with meals  Ferrous Sulfate (SLOW RELEASE IRON) 142 mg (45 mg iron) Oral Tablet Sustained Release, Take 1 Tab (142 mg total) by mouth Three times a day as needed  GLUCOS CHOND CPLX ADVANCED ORAL, qd  metoprolol (LOPRESSOR) 25 mg Oral Tablet, Take 1 Tab (25 mg total) by mouth Twice daily  metroNIDAZOLE (METROGEL) 1 % Gel with Pump, by Apply externally route Once a  day  Omeprazole 20 mg Oral Tablet, Delayed Release (E.C.), take  by mouth.            Review of Systems   Constitutional: Negative.    HENT: Negative.    Eyes: Negative.    Respiratory: Negative.    Cardiovascular: Negative.    Gastrointestinal: Positive for abdominal pain.   Musculoskeletal: Positive for back pain and arthralgias.   Psychiatric/Behavioral: Negative.      Objective:   Physical Exam   Constitutional: He is oriented to person, place, and time.   BP 136/82 mmHg   Pulse 76   Temp(Src) 36.6 C (97.9 F)   Ht 1.748 m (5' 8.82")   Wt 137.9 kg (304 lb 0.2 oz)   BMI 45.13 kg/m2     HENT:   Mouth/Throat: Oropharynx is clear and moist.   Neck: Neck supple. No thyromegaly present.   Cardiovascular: Normal rate and regular rhythm.    Pulmonary/Chest: Effort normal and breath sounds normal.   Abdominal: Bowel sounds are normal.   Neurological: He is alert and oriented to person, place, and time.   Psychiatric: He has a normal mood and affect.     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

## 2014-04-30 NOTE — Progress Notes (Signed)
[place]                                [place], [place] [address]                                PATIENT NAME: Henry [name], Henry [name] [facility] UDTHYH:[id number]  DATE OF SERVICE:04/26/2014  DATE OF BIRTH: 1955/04/02    PROGRESS NOTE    HISTORY OF PRESENT ILLNESS:  This 59 year old gentleman is here following up for low iron.  He also has a history of a right total hip replacement by Dr.  Caryl Comes.  When also seen by orthopaedics he had complaints of neck pain and was referred to the spine center for that problem.  He has not been tolerating his iron supplement, complaining of nausea and abdominal pain.  A consultation with the departmental pharmacist and discussions resulted in a change in medication today and he will attempt his iron replacement with slow iron b.i.d with a repeat iron in about 3 months.      Rosemarie Beath, MD  Professor  [facility] Department of Family Medicine    QA/SUO/[id number]; D: 04/30/2014 07:52:29; T: 04/30/2014 11:44:42

## 2014-05-02 ENCOUNTER — Ambulatory Visit (HOSPITAL_COMMUNITY): Payer: BC Managed Care – PPO | Admitting: Nuclear Radiology

## 2014-05-02 ENCOUNTER — Ambulatory Visit
Admission: RE | Admit: 2014-05-02 | Discharge: 2014-05-02 | Disposition: A | Discharge status: Home / Self Care | Payer: BC Managed Care – PPO | Source: Ambulatory Visit | Attending: Nuclear Radiology | Admitting: Nuclear Radiology

## 2014-05-02 ENCOUNTER — Encounter (HOSPITAL_COMMUNITY)
Admission: RE | Disposition: A | Discharge status: Home / Self Care | Payer: Self-pay | Source: Ambulatory Visit | Attending: Nuclear Radiology

## 2014-05-02 DIAGNOSIS — M25552 Pain in left hip: Secondary | ICD-10-CM

## 2014-05-02 DIAGNOSIS — M1612 Unilateral primary osteoarthritis, left hip: Secondary | ICD-10-CM

## 2014-05-02 SURGERY — IR ARTHROGRAM - HIP LEFT
Laterality: Left

## 2014-05-02 MED ORDER — LIDOCAINE HCL 10 MG/ML (1 %) INJECTION SOLUTION
6.0000 mL | Freq: Once | INTRAMUSCULAR | Status: AC
Start: 2014-05-02 — End: 2014-05-02
  Administered 2014-05-02: 60 mg

## 2014-05-02 MED ORDER — ROPIVACAINE (PF) 5 MG/ML (0.5 %) INJECTION SOLUTION
4.0000 mL | Freq: Once | INTRAMUSCULAR | Status: AC
Start: 2014-05-02 — End: 2014-05-02
  Administered 2014-05-02: 4 mL via INTRA_ARTICULAR

## 2014-05-02 MED ORDER — TRIAMCINOLONE ACETONIDE 40 MG/ML SUSPENSION FOR INJECTION
40.0000 mg | Freq: Once | INTRAMUSCULAR | Status: AC
Start: 2014-05-02 — End: 2014-05-02
  Administered 2014-05-02: 40 mg via INTRA_ARTICULAR

## 2014-05-02 MED ORDER — LIDOCAINE HCL 10 MG/ML (1 %) INJECTION SOLUTION
INTRAMUSCULAR | Status: AC
Start: 2014-05-02 — End: 2014-05-02
  Filled 2014-05-02: qty 20

## 2014-05-02 MED ORDER — TRIAMCINOLONE ACETONIDE 40 MG/ML SUSPENSION FOR INJECTION
INTRAMUSCULAR | Status: AC
Start: 2014-05-02 — End: 2014-05-02
  Filled 2014-05-02: qty 5

## 2014-05-02 MED ORDER — ROPIVACAINE (PF) 5 MG/ML (0.5 %) INJECTION SOLUTION
INTRAMUSCULAR | Status: AC
Start: 2014-05-02 — End: 2014-05-02
  Filled 2014-05-02: qty 1

## 2014-05-02 MED ORDER — IOPAMIDOL 300 MG IODINE/ML (61 %) INTRAVENOUS SOLUTION
4.00 mL | INTRAVENOUS | Status: AC
Start: 2014-05-02 — End: 2014-05-02
  Administered 2014-05-02: 11:00:00 4 mL via INTRA_ARTICULAR

## 2014-05-02 MED ADMIN — erythromycin 5 mg/gram (0.5 %) eye ointment: @ 11:00:00 | NDC 17478082401

## 2014-05-02 SURGICAL SUPPLY — 1 items: CONV USE 316258 - TRAY MYLGR CLR 3.5IN 22GA SFTM_YL STD HUB SPINAL NEEDLE (KITS & TRAYS (DISPOSABLE)) ×2 IMPLANT

## 2014-05-08 DIAGNOSIS — E440 Moderate protein-calorie malnutrition: Secondary | ICD-10-CM | POA: Diagnosis not present

## 2014-05-08 DIAGNOSIS — K219 Gastro-esophageal reflux disease without esophagitis: Secondary | ICD-10-CM | POA: Diagnosis not present

## 2014-05-08 DIAGNOSIS — F332 Major depressive disorder, recurrent severe without psychotic features: Secondary | ICD-10-CM | POA: Diagnosis not present

## 2014-05-08 DIAGNOSIS — K529 Noninfective gastroenteritis and colitis, unspecified: Secondary | ICD-10-CM | POA: Diagnosis not present

## 2014-05-08 DIAGNOSIS — E780 Pure hypercholesterolemia: Secondary | ICD-10-CM | POA: Diagnosis not present

## 2014-05-08 DIAGNOSIS — M542 Cervicalgia: Secondary | ICD-10-CM | POA: Diagnosis not present

## 2014-05-08 DIAGNOSIS — G894 Chronic pain syndrome: Secondary | ICD-10-CM | POA: Diagnosis not present

## 2014-05-08 DIAGNOSIS — M545 Low back pain: Secondary | ICD-10-CM | POA: Diagnosis not present

## 2014-06-06 ENCOUNTER — Ambulatory Visit
Payer: BC Managed Care – PPO | Attending: Orthopaedics-Spine Service Non-Operative | Admitting: Orthopaedics-Spine Service Non-Operative

## 2014-06-06 DIAGNOSIS — Z882 Allergy status to sulfonamides status: Secondary | ICD-10-CM | POA: Insufficient documentation

## 2014-06-06 DIAGNOSIS — M47812 Spondylosis without myelopathy or radiculopathy, cervical region: Secondary | ICD-10-CM

## 2014-06-06 DIAGNOSIS — E785 Hyperlipidemia, unspecified: Secondary | ICD-10-CM | POA: Insufficient documentation

## 2014-06-06 DIAGNOSIS — Z7982 Long term (current) use of aspirin: Secondary | ICD-10-CM | POA: Insufficient documentation

## 2014-06-06 DIAGNOSIS — G4730 Sleep apnea, unspecified: Secondary | ICD-10-CM | POA: Insufficient documentation

## 2014-06-06 DIAGNOSIS — Z91013 Allergy to seafood: Secondary | ICD-10-CM | POA: Insufficient documentation

## 2014-06-06 DIAGNOSIS — I10 Essential (primary) hypertension: Secondary | ICD-10-CM | POA: Insufficient documentation

## 2014-06-06 DIAGNOSIS — K219 Gastro-esophageal reflux disease without esophagitis: Secondary | ICD-10-CM | POA: Insufficient documentation

## 2014-06-06 DIAGNOSIS — M5412 Radiculopathy, cervical region: Secondary | ICD-10-CM

## 2014-06-06 DIAGNOSIS — R12 Heartburn: Secondary | ICD-10-CM | POA: Insufficient documentation

## 2014-06-06 DIAGNOSIS — Z720 Tobacco use: Secondary | ICD-10-CM | POA: Insufficient documentation

## 2014-06-06 MED ORDER — GABAPENTIN 100 MG CAPSULE
ORAL_CAPSULE | ORAL | Status: DC
Start: 2014-06-06 — End: 2014-08-21

## 2014-06-06 NOTE — Progress Notes (Signed)
[place]        Patient Name:  Henry [name]   Date of Birth:  1955/09/07   MRN#:  [id number]  Date of Service:  06/06/2014         WPY:KDXIPJ B Selby, MD  [address]  [place] [address]  Referring:Klein, Henry Iles, MD  [address] [place], [place] [address] , thank you for this request for consultation.    Chief Complaint   Patient presents with    Neck Pain       History of Present Illness: Henry [name]  is a 59 y.o. male who presents with a complaint of neck .      Pain began 06/2013  Pain is located at neck with radiation to R arm.   The pain is described as numbness, throbbing in character, 3-6/10 in severity  Exacerbated with activity.  Alleviated with rest.    There is no associated numbness or tingling  There is no weakness.  There is no new bowel/bladder symptoms    Prior Treatments:  -Physical Therapy? PT for 5 weeks twice a week-- 70% better  -Prior injections? Only in hip  -Medications? 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 Caryl [name]           Outpatient Prescriptions Marked as Taking for the 06/06/14 encounter (Office Visit) with Radford Pax, MD   Medication Sig    aspirin (ECOTRIN) 81 mg Oral Tablet, Delayed Release (E.C.) Take 81 mg by mouth Once a day    atorvastatin (LIPITOR) 20 mg Oral Tablet Take 1 Tab (20 mg total) by mouth Every evening    diclofenac sodium (VOLTAREN) 75 mg Oral Tablet, Delayed  Release (E.C.) Take 1 Tab (75 mg total) by mouth Twice daily    dicyclomine (BENTYL) 20 mg Oral Tablet Take 1 Tab (20 mg total) by mouth Four times a day    ferrous sulfate (FEOSOL) 325 mg (65 mg iron) Oral Tablet Take 1 Tab (325 mg total) by mouth Three times daily with meals    Ferrous Sulfate (SLOW RELEASE IRON) 142 mg (45 mg iron) Oral Tablet Sustained Release Take 1 Tab (142 mg total) by mouth Three times a day as needed    GLUCOS CHOND CPLX ADVANCED ORAL qd    metoprolol (LOPRESSOR) 25 mg Oral Tablet Take 1 Tab (25 mg total) by mouth Twice daily    metroNIDAZOLE (METROGEL) 1 % Gel with Pump by Apply externally route Once a day    Omeprazole 20 mg Oral Tablet, Delayed Release (E.C.) take  by mouth.       Allergy History as of 06/06/14  SULFA (SULFONAMIDES)       Noted Status Severity Type Reaction    10/25/07 Elmyra Ricks, RN 10/25/07 Active   Rash          SHELLFISH CONTAINING PRODUCTS       Noted Status Severity Type Reaction    05/16/13 1057 Janann Colonel, ANP 04/04/13 Active Low  Itching, Swelling    Comments:  Shrimp - eye swelling, itching     04/04/13 1402 Gerri Lins 04/04/13 Active       Comments:  shrimp                 Family History   Problem Relation Age of Onset    Diabetes Mother     Cancer Mother     High Cholesterol Mother     Hypertension Mother     Coronary Artery Disease Mother     Migraines Mother     Cancer Father     Asthma Father     COPD Father            Otherwise, PMH, PSH, and family history for Spine is negative.  History     Social History    Marital Status: Married     Spouse Name: N/A    Number of Children: N/A    Years of Education: N/A     Occupational History     [employer]     Social History Main Topics    Smoking status: Never Smoker     Smokeless tobacco: Never Used      Comment: very rare cigar    Alcohol Use: Yes      Comment: 2-3 times monthly    Drug Use: No    Sexual Activity: Not on file     Other Topics Concern    Abuse/Domestic  Violence No    Caffeine Concern No    Calcium Intake Adequate Yes    Computer Use Yes    Drives Yes    Exercise Concern No    Seat Belt Yes    Special Diet No    Sunscreen Used Yes    Right Hand Dominant Yes    Routine Exercise No    Ability To Walk 2 Flight Of Steps Without Sob/Cp Yes    Ability To Do Own Adl's Yes    Other Activity Level Yes     14 steps at home. walks a lot. shoveled snow this winter. limited by pain.     Social History Narrative             REVIEW OF SYSTEMS:   Except as noted above, pt denies fevers, chills, night sweats, unintentional weight loss or gain, incoordination, falling, GI bleeds or saddle anesthesia. Review is otherwise as per the HPI.     PHYSICAL EXAMINATION    Filed Vitals:    06/06/14 1211   BP: 173/96   Pulse: 63   Height: 1.753 m (5' 9" )     There is no weight on file to calculate BMI.  General Appearance:Well developed, well nourished and in no acute distress, alert and oriented x3 and cooperative for evaluation  Psychiatric: Mood and affect are normal.  Answers questions appropriately.  Respiratory: Breathing comfortably, no accessory muscle use.  Cardiovascular:  2+ distal pulses in lower limbs.  No peripheral edema or cyanosis.  Lymphatics  No adenopathy in the extremities.  Dermatologic: Skin is intact and without notable lesions.  HEENT: Normocephalic, atraumatic, anicteric. Mucous membranes moist.  Extraocular  movements are intact.  Abdomen:  Soft, nontender.    CERVICAL & UPPER EXTREMITY EXAM  Neck:   Inspection: Midline with normal cervical lordosis. Rounded shoulders, forward head  ROM: Full.  Tenderness to Palpation: C4-7      Bilateral upper extremities:   ROM: Full ROM of all joints without crepitus, locking, effusions, warmth or instability.  Strength: 5/5 in Bilat UE     Neurologic:   Sensation: Intact in upper limbs.    Reflexes: 2+ and symmetric in the upper limbs.   Special Tests: Hoffman's is negative.   Spurling is negative. Lhermitte's sign is  negative.   Gait: Pain-free and stable in tandem and reciprocal patterns. Romberg testing is negative.            Images and report reviewed:  XR Hips 03/2014: Degenerative arthrosis are demonstrated in the bilateral hips, which is more advanced on the right. In fact, there are significant joint space loss and eburnation, as well as prominent subchondral cystic changes of the right hip. 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 No acute bony abnormality is detected.  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 Moderate left hip degenerative arthrosis is also redemonstrated. No new bony abnormality is detected.  XR Cervical 08/2013: There is no evidence of acute bony abnormality. There is loss of disc space height at C5-C6 and C6-C7. There is facet arthropathy at C7-T1 with grade 1 anterolisthesis of C7 relative to T1. There is uncovertebral degenerative changes in the cervical spine most pronounced at C2-C3 and C4-C5.          IMPRESSION:  1. Radicular cervical pain with intermittent numbness both proximally and distally, possibly 2/2 changes at C5-6     There are no signs of upper tract signs or bowel/bladder dysfunction or significant weight loss    PLAN:  Diagnostic Studies: Ordered MRI  Medication:  Rx Neurontin today   Therapy: Continue HEP.  Completed a month ago.  Did PT for 5 weeks twice a week-- 70% better and was discharged.  Interventions:   04/2014: Hip arthrogram with steroid- good relief  Education: Reviewed benefits and side effects of medications.  Discussed role and goals of PT.   Follow-up: Return to clinic in 6 weeks.

## 2014-06-07 ENCOUNTER — Ambulatory Visit (INDEPENDENT_AMBULATORY_CARE_PROVIDER_SITE_OTHER): Payer: BC Managed Care – PPO | Admitting: Orthopaedics-Spine Service Non-Operative

## 2014-07-18 ENCOUNTER — Ambulatory Visit
Payer: Managed Care, Other (non HMO) | Attending: Orthopaedics-Spine Service Non-Operative | Admitting: Orthopaedics-Spine Service Non-Operative

## 2014-07-18 ENCOUNTER — Ambulatory Visit (HOSPITAL_BASED_OUTPATIENT_CLINIC_OR_DEPARTMENT_OTHER): Payer: Self-pay | Admitting: Orthopaedic Surgery

## 2014-07-18 ENCOUNTER — Encounter (INDEPENDENT_AMBULATORY_CARE_PROVIDER_SITE_OTHER): Payer: Self-pay | Admitting: Orthopaedics-Spine Service Non-Operative

## 2014-07-18 DIAGNOSIS — K219 Gastro-esophageal reflux disease without esophagitis: Secondary | ICD-10-CM | POA: Insufficient documentation

## 2014-07-18 DIAGNOSIS — K589 Irritable bowel syndrome without diarrhea: Secondary | ICD-10-CM | POA: Insufficient documentation

## 2014-07-18 DIAGNOSIS — E785 Hyperlipidemia, unspecified: Secondary | ICD-10-CM | POA: Insufficient documentation

## 2014-07-18 DIAGNOSIS — Z7982 Long term (current) use of aspirin: Secondary | ICD-10-CM | POA: Insufficient documentation

## 2014-07-18 DIAGNOSIS — M5412 Radiculopathy, cervical region: Secondary | ICD-10-CM | POA: Insufficient documentation

## 2014-07-18 DIAGNOSIS — I10 Essential (primary) hypertension: Secondary | ICD-10-CM | POA: Insufficient documentation

## 2014-07-18 DIAGNOSIS — Z79899 Other long term (current) drug therapy: Secondary | ICD-10-CM | POA: Insufficient documentation

## 2014-07-18 NOTE — Progress Notes (Signed)
[name]        Patient Name:  Henry [name]   Date of Birth:  18-Sep-1955   MRN#:  [id number]  Date of Service:  07/18/2014         TGG:YIRSWN B Angelia Mould, MD  [address]  [place] [address]  Referring:Klein, Heywood Iles, MD  [address] [place], [place] [address] , thank you for this request for consultation.    Chief Complaint   Patient presents with    Neck Pain       History of Present Illness: Henry [name]  is a 59 y.o. male who presents with a complaint of neck .      Pain began 06/2013  Pain is located at neck with radiation to R arm.   The pain is described as numbness, throbbing in character, 3-6/10 in severity  Exacerbated with activity.  Alleviated with rest.    There is no associated numbness or tingling  There is no weakness.  There is no new bowel/bladder symptoms    Prior Treatments:  -Physical Therapy? PT for 5 weeks twice a week-- 70% better  -Prior injections? Only in hip  -Medications? diclofenac      Work: Archivist-  Driving, desk work    INTERVAL VISIT  07/18/14:  Doing HEP which helps.  Work duties don't cause significant pain.  Taking Neurontin 300 mg at night without side effects.  No longer having arm symptoms.  Has mild symptoms axially.  3/10 neck pain.      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 Caryl Comes           Outpatient Prescriptions Marked as Taking for the 07/18/14 encounter (Office Visit) with Radford Pax, MD   Medication Sig    aspirin (ECOTRIN) 81  mg Oral Tablet, Delayed Release (E.C.) Take 81 mg by mouth Once a day    atorvastatin (LIPITOR) 20 mg Oral Tablet Take 1 Tab (20 mg total) by mouth Every evening    diclofenac sodium (VOLTAREN) 75 mg Oral Tablet, Delayed Release (E.C.) Take 1 Tab (75 mg total) by mouth Twice daily    dicyclomine (BENTYL) 20 mg Oral Tablet Take 1 Tab (20 mg total) by mouth Four times a day    ferrous sulfate (FEOSOL) 325 mg (65 mg iron) Oral Tablet Take 1 Tab (325 mg total) by mouth Three times daily with meals    Ferrous Sulfate (SLOW RELEASE IRON) 142 mg (45 mg iron) Oral Tablet Sustained Release Take 1 Tab (142 mg total) by mouth Three times a day as needed    gabapentin (NEURONTIN) 100 mg Oral Capsule Take one capsule by mouth every night for 3 nights, then increase to 3 caps at  night.    GLUCOS CHOND CPLX ADVANCED ORAL qd    metoprolol (LOPRESSOR) 25 mg Oral Tablet Take 1 Tab (25 mg total) by mouth Twice daily    metroNIDAZOLE (METROGEL) 1 % Gel with Pump by Apply externally route Once a day    Omeprazole 20 mg Oral Tablet, Delayed Release (E.C.) take  by mouth.       Allergy History as of 07/18/14     SULFA (SULFONAMIDES)       Noted Status Severity Type Reaction    10/25/07 Elmyra Ricks, RN 10/25/07 Active   Rash          SHELLFISH CONTAINING PRODUCTS       Noted Status Severity Type Reaction    05/16/13 1057 Janann Colonel, ANP 04/04/13 Active Low  Itching, Swelling    Comments:  Shrimp - eye swelling, itching     04/04/13 1402 Gerri Lins 04/04/13 Active       Comments:  shrimp                 Family History   Problem Relation Age of Onset    Diabetes Mother     Cancer Mother     High Cholesterol Mother     Hypertension Mother     Coronary Artery Disease Mother     Migraines Mother     Cancer Father     Asthma Father     COPD Father            Otherwise, PMH, PSH, and family history for Spine is negative.  History     Social History    Marital Status: Married     Spouse Name: N/A    Number of  Children: N/A    Years of Education: N/A     Occupational History     [employer]     Social History Main Topics    Smoking status: Never Smoker     Smokeless tobacco: Never Used      Comment: very rare cigar    Alcohol Use: Yes      Comment: 2-3 times monthly    Drug Use: No    Sexual Activity: Not on file     Other Topics Concern    Abuse/Domestic Violence No    Caffeine Concern No    Calcium Intake Adequate Yes    Computer Use Yes    Drives Yes    Exercise Concern No    Seat Belt Yes    Special Diet No    Sunscreen Used Yes    Right Hand Dominant Yes    Routine Exercise No    Ability To Walk 2 Flight Of Steps Without Sob/Cp Yes    Ability To Do Own Adl's Yes    Other Activity Level Yes     14 steps at home. walks a lot. shoveled snow this winter. limited by pain.     Social History Narrative             REVIEW OF SYSTEMS:   Except as noted above, pt denies fevers, chills, night sweats, unintentional weight loss or gain, incoordination, falling, GI bleeds or saddle anesthesia. Review is otherwise as per the HPI.     PHYSICAL EXAMINATION    There were no vitals filed for this visit.  There is no weight on file to calculate BMI.  General Appearance:Well developed, well nourished and in no acute distress, alert and oriented x3 and cooperative for evaluation  Psychiatric:  Mood and affect are normal.  Answers questions appropriately.  Respiratory: Breathing comfortably, no accessory muscle use.  Cardiovascular:  2+ distal pulses in lower limbs.  No peripheral edema or cyanosis.  Lymphatics  No adenopathy in the extremities.  Dermatologic: Skin is intact and without notable lesions.  HEENT: Normocephalic, atraumatic, anicteric. Mucous membranes moist.  Extraocular movements are intact.  Abdomen:  Soft, nontender.    CERVICAL & UPPER EXTREMITY EXAM  Neck:   Inspection: Midline with normal cervical lordosis. Rounded shoulders, forward head  ROM: Full.  Tenderness to Palpation: C4-7      Bilateral  upper extremities:   ROM: Full ROM of all joints without crepitus, locking, effusions, warmth or instability.  Strength: 5/5 in Bilat UE     Neurologic:   Sensation: Intact in upper limbs.    Reflexes: 2+ and symmetric in the upper limbs.   Special Tests: Hoffman's is negative.   Spurling is negative. Lhermitte's sign is negative.   Gait: Pain-free and stable in tandem and reciprocal patterns. Romberg testing is negative.            Images and report reviewed:  XR Hips 03/2014: Degenerative arthrosis are demonstrated in the bilateral hips, which is more advanced on the right. In fact, there are significant joint space loss and eburnation, as well as prominent subchondral cystic changes of the right hip. 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 No acute bony abnormality is detected.  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 Moderate left hip degenerative arthrosis is also redemonstrated. No new bony abnormality is detected.  XR Cervical 08/2013: There is no evidence of acute bony abnormality. There is loss of disc space height at C5-C6 and C6-C7. There is facet arthropathy at C7-T1 with grade 1 anterolisthesis of C7 relative to T1. There is uncovertebral degenerative changes in the cervical spine most pronounced at C2-C3 and C4-C5.          IMPRESSION:  1. Radicular cervical pain with intermittent numbness both proximally and distally, possibly 2/2 changes at C5-6     There are no signs of upper tract signs or bowel/bladder dysfunction or significant weight loss    PLAN:  Diagnostic Studies: Order MRI if pain recurs  Medication:  Decr Neurontin today   Therapy: Continue HEP.  Completed a month ago.  Did PT for 5 weeks twice a week-- 70% better and was discharged.  Interventions:   04/2014: Hip arthrogram with steroid- good relief  Education: Reviewed benefits and side effects of medications.  Discussed role and  goals of PT.   Follow-up: Return to clinic in 6 weeks.

## 2014-07-19 ENCOUNTER — Encounter (HOSPITAL_BASED_OUTPATIENT_CLINIC_OR_DEPARTMENT_OTHER): Payer: Self-pay | Admitting: Orthopaedic Surgery

## 2014-07-19 ENCOUNTER — Ambulatory Visit: Payer: Managed Care, Other (non HMO) | Attending: Orthopaedic Surgery | Admitting: Orthopaedic Surgery

## 2014-07-19 VITALS — BP 160/93 | HR 74 | Temp 98.4°F | Ht 69.69 in | Wt 300.5 lb

## 2014-07-19 DIAGNOSIS — E669 Obesity, unspecified: Secondary | ICD-10-CM | POA: Insufficient documentation

## 2014-07-19 DIAGNOSIS — Z96641 Presence of right artificial hip joint: Secondary | ICD-10-CM | POA: Insufficient documentation

## 2014-07-19 DIAGNOSIS — Z471 Aftercare following joint replacement surgery: Secondary | ICD-10-CM | POA: Insufficient documentation

## 2014-07-19 DIAGNOSIS — Z96649 Presence of unspecified artificial hip joint: Secondary | ICD-10-CM

## 2014-07-19 DIAGNOSIS — M1612 Unilateral primary osteoarthritis, left hip: Secondary | ICD-10-CM

## 2014-07-19 DIAGNOSIS — Z9660 Presence of unspecified orthopedic joint implant: Secondary | ICD-10-CM

## 2014-07-20 NOTE — Progress Notes (Signed)
[place], [place] [id number]                                PATIENT NAME: JEREMYAH, JELLEY [facility] LYYTKP:[id number]  DATE OF SERVICE:07/19/2014  DATE OF BIRTH: 07-Jan-1956    PROGRESS NOTE    PRIMARY CARE PHYSICIAN:  Rosemarie Beath, MD.    SUBJECTIVE:  Urias is the 59 year old status post right total hip replacement surgery on May 22, 2013.  At his last visit, he was complaining of left hip pain.  I ordered a fluoroscopically-guided left hip injection for moderate-to-severe arthritis.  The injection worked right away.  It remains effective.  He is extremely pleased with the outcome of the left hip injection.  The right hip replacement is performing beautifully.  He has absolutely no limitations.    OBJECTIVE:  On exam, he is well-appearing and in no apparent distress.  He walks beautifully with a normal reciprocating gait.  There is absolutely no detectable limp.  The surgical site is without signs of infection.  Leg lengths appear to be equal.  Range of motion of the right hip is 80 degrees of flexion, 5 degrees internal rotation, 30 degrees external rotation, 20 degrees of abduction and 10 degrees of adduction.  He has similar motion on the left and the extremes of flexion with adduction and internal rotation produces left groin pain.    Prior radiographs of the hips reveal a well-aligned, well-fixed Smith and Nephew right total hip replacement.  The left hip is moderately-to-severely arthritic with large Cam lesion best seen on the frogleg lateral and joint space narrowing involving the superior joint space.    IMPRESSION:  1.  Status post satisfactory right total hip replacement.  2.  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  3.  Obesity with a height of 177.0 cm and weight of 136.3 kilograms.    PLAN:  Left total hip replacement surgery is inevitable but he has  obtained substantial pain relief with the injection.  The injection can be repeated 3 months from the last injection and we can do them every 3-4 months as needed until such time that he is ready to undergo left total hip replacement.  He understands that his BMI needs to be 40 or less before he undergoes left hip replacement.  With respect to the right hip, he has a well-functioning total hip replacement.  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  I would like to see him back at his 3-year anniversary, which would be 2 years from now with an updated bilateral hip x-ray series or sooner if the left hip becomes more symptomatic.  The right hip; however, does not need to be imaged before June 2018 unless right hip pain escalates.      Jacqualine Code, MD  Assistant Professor  [facility] Department of Orthopaedics    NT/[id number]; D: 07/19/2014 16:46:53; T: 07/20/2014 08:52:12    cc: Rosemarie Beath MD      Suanne Marker

## 2014-07-31 ENCOUNTER — Encounter (HOSPITAL_BASED_OUTPATIENT_CLINIC_OR_DEPARTMENT_OTHER): Payer: BC Managed Care – PPO | Admitting: Family Medicine

## 2014-08-01 ENCOUNTER — Other Ambulatory Visit (INDEPENDENT_AMBULATORY_CARE_PROVIDER_SITE_OTHER): Payer: Self-pay | Admitting: Orthopaedics-Spine Service Non-Operative

## 2014-08-01 ENCOUNTER — Ambulatory Visit (INDEPENDENT_AMBULATORY_CARE_PROVIDER_SITE_OTHER): Payer: Self-pay | Admitting: Orthopaedics-Spine Service Non-Operative

## 2014-08-01 NOTE — Telephone Encounter (Signed)
Faxed 90 day supply request for [facility] to [facility] [id number] (gabapentin 100 mg take one by mouth at bedtime for 3 nights then increase to 3 caps at night, #90 caps with 2 refills).  Informed pharmacy by phone that patient needs to obtain Gabapentin from his PCP.   Reading my last note, it looks like we were tapering the gabapentin, so I'm not sure he needs a 3 month supply. Since I'm unsure which dose he's going to end up with, I think he should probably just get it from his PCP.     Thanks         Previous Messages      ----- Message -----    From: Manus Rudd, RN    Sent: 07/31/2014  3:32 PM     To: Radford Pax, MD   Subject: FW: 90 DAY SUPPLY                   Do you want to order 90 day supply or have PCP order?   ----- Message -----    From: Rayburn Go    Sent: 07/31/2014  3:30 PM     To: [facility] Radford Pax Service   Subject: RE: 90 DAY SUPPLY                                       Documentation      Summary: RE: 90 DAY SUPPLY     >> SHERRY ICE 07/31/2014 03:30 PM  DR. Vallonia called to check on getting medication for a 90 day supply    gabapentin (NEURONTIN) 100 mg Oral Capsule 90 Cap 2 06/06/2014    Sig: Take one capsule by mouth every night for 3 nights, then increase to 3 caps at night.       [facility]                     Call History         Type Hotel manager     07/31/2014 03:28 PM Phone ([facility]coming) [id number] [address] [facility]armacy[facility]

## 2014-08-02 ENCOUNTER — Encounter (HOSPITAL_BASED_OUTPATIENT_CLINIC_OR_DEPARTMENT_OTHER): Payer: BC Managed Care – PPO | Admitting: Family Medicine

## 2014-08-21 ENCOUNTER — Ambulatory Visit (INDEPENDENT_AMBULATORY_CARE_PROVIDER_SITE_OTHER): Payer: Self-pay | Admitting: Orthopaedics-Spine Service Non-Operative

## 2014-08-21 ENCOUNTER — Other Ambulatory Visit (HOSPITAL_BASED_OUTPATIENT_CLINIC_OR_DEPARTMENT_OTHER): Payer: Self-pay | Admitting: Family Medicine

## 2014-08-21 NOTE — Telephone Encounter (Signed)
-----   Message from Jori Moll sent at 08/21/2014 11:52 AM EDT -----  >> REBECCA E WESTBROOK 08/21/2014 11:52 AM  Angelia Mould PT,    Pharmacy calling to request a refill on the following medication.  States the PT needs it for a 90 day supply.  Thank you.    gabapentin (NEURONTIN) 100 mg Oral Capsule 90 Cap 2 06/06/2014     Sig: Take one capsule by mouth every night for 3 nights, then increase to 3 caps at night.     Preferred Pharmacy     [id number] [place] [place]   [id number]   Phone: [phone]: [phone]   Open 24 Hours?: No

## 2014-08-21 NOTE — Telephone Encounter (Signed)
Received the following In Basket Message:  [facility] pt     Pharmacy called. Pt's insurance is requiring this to be a 90 day supply. Asking for a new Rx.     agabapentin (NEURONTIN) 100 mg Oral Capsule 90 Cap 2 06/06/2014    Sig: Take one capsule by mouth every night for 3 nights, then increase to 3 caps at night.    Preferred Pharmacy   [id number] [place], [place] [place]  [id number]  Phone: [phone]: [phone]    Phone call to Pharmacy to inform them Dr. SAldona Baris no longer at this institution. They need to contact patient's PCP for a new Neurontin RX.

## 2014-08-22 ENCOUNTER — Other Ambulatory Visit (HOSPITAL_BASED_OUTPATIENT_CLINIC_OR_DEPARTMENT_OTHER): Payer: Self-pay | Admitting: Family Medicine

## 2014-08-22 MED ORDER — GABAPENTIN 100 MG CAPSULE
ORAL_CAPSULE | ORAL | Status: DC
Start: 2014-08-22 — End: 2016-01-27

## 2014-08-22 MED ORDER — ATORVASTATIN 20 MG TABLET
20.0000 mg | ORAL_TABLET | Freq: Every evening | ORAL | Status: DC
Start: 2014-08-22 — End: 2015-10-18

## 2014-08-23 ENCOUNTER — Other Ambulatory Visit (HOSPITAL_BASED_OUTPATIENT_CLINIC_OR_DEPARTMENT_OTHER): Payer: Self-pay | Admitting: Family Medicine

## 2014-08-27 ENCOUNTER — Other Ambulatory Visit (HOSPITAL_BASED_OUTPATIENT_CLINIC_OR_DEPARTMENT_OTHER): Payer: Self-pay | Admitting: Family Medicine

## 2014-08-27 ENCOUNTER — Ambulatory Visit (HOSPITAL_BASED_OUTPATIENT_CLINIC_OR_DEPARTMENT_OTHER): Payer: Self-pay | Admitting: Family Medicine

## 2014-08-27 DIAGNOSIS — E7800 Pure hypercholesterolemia, unspecified: Secondary | ICD-10-CM

## 2014-08-27 DIAGNOSIS — Z8042 Family history of malignant neoplasm of prostate: Secondary | ICD-10-CM

## 2014-08-27 DIAGNOSIS — E611 Iron deficiency: Secondary | ICD-10-CM

## 2014-08-27 NOTE — Telephone Encounter (Signed)
-----   Message from Jori Moll sent at 08/27/2014 11:14 AM EDT -----  >> REBECCA E WESTBROOK 08/27/2014 11:14 AM  Angelia Mould PT,    PT calling to request that lab orders to test his iron levels and to test to make sure his prostates is not having any issues to be placed in the system.  States he would like to have this done before his appointment on 8.9.16.  Requesting a call when the lab orders are in the system.  Thank you.

## 2014-08-28 ENCOUNTER — Encounter (HOSPITAL_BASED_OUTPATIENT_CLINIC_OR_DEPARTMENT_OTHER): Payer: Self-pay | Admitting: Family Medicine

## 2014-08-28 ENCOUNTER — Ambulatory Visit (HOSPITAL_BASED_OUTPATIENT_CLINIC_OR_DEPARTMENT_OTHER): Payer: Self-pay | Admitting: Orthopaedic Surgery

## 2014-08-28 ENCOUNTER — Other Ambulatory Visit (HOSPITAL_BASED_OUTPATIENT_CLINIC_OR_DEPARTMENT_OTHER): Payer: Self-pay | Admitting: Physician Assistant

## 2014-08-28 DIAGNOSIS — M25552 Pain in left hip: Secondary | ICD-10-CM

## 2014-08-28 DIAGNOSIS — M1612 Unilateral primary osteoarthritis, left hip: Secondary | ICD-10-CM

## 2014-08-28 NOTE — Telephone Encounter (Signed)
Returned patient call.  Left message that we will order the hip injection and someone from radiology will contact him to schedule. Order placed. Left my number of [phone] to call if he had any questions

## 2014-08-28 NOTE — Telephone Encounter (Signed)
-----   Message from Salvatore Decent sent at 08/28/2014  1:13 PM EDT -----  >> Judson Roch R SHRIVER 08/28/2014 01:13 PM  KLEIN PT     Pt called. Would like another injection in his Left Hip. Can you please order that for him?    He would like to get it prior to 7/22 if possible.     Thanks.

## 2014-09-03 ENCOUNTER — Other Ambulatory Visit (HOSPITAL_BASED_OUTPATIENT_CLINIC_OR_DEPARTMENT_OTHER): Payer: Self-pay | Admitting: Family Medicine

## 2014-09-04 MED ORDER — DICLOFENAC SODIUM 75 MG TABLET,DELAYED RELEASE
75.0000 mg | DELAYED_RELEASE_TABLET | Freq: Two times a day (BID) | ORAL | Status: DC
Start: 2014-09-04 — End: 2015-08-16

## 2014-09-21 ENCOUNTER — Ambulatory Visit: Payer: Managed Care, Other (non HMO) | Attending: Family Medicine

## 2014-09-21 DIAGNOSIS — E7800 Pure hypercholesterolemia, unspecified: Secondary | ICD-10-CM

## 2014-09-21 DIAGNOSIS — E611 Iron deficiency: Secondary | ICD-10-CM

## 2014-09-21 DIAGNOSIS — Z8042 Family history of malignant neoplasm of prostate: Secondary | ICD-10-CM | POA: Insufficient documentation

## 2014-09-21 DIAGNOSIS — D509 Iron deficiency anemia, unspecified: Secondary | ICD-10-CM | POA: Insufficient documentation

## 2014-09-21 LAB — FREE PSA
FREE PSA/PSA RATIO: 0.26
PSA FREE: 1.1 ng/mL (ref <=2.50)

## 2014-09-21 LAB — LIPID PANEL
CHOL/HDL RATIO: 4.1
CHOLESTEROL: 153 mg/dL (ref <200)
HDL CHOL: 37 mg/dL — ABNORMAL LOW (ref >=39)
LDL CALC: 76 mg/dL (ref <=100)
NON-HDL: 116 mg/dL (ref <=190)
TRIGLYCERIDES: 199 mg/dL — ABNORMAL HIGH (ref <=150)
VLDL CALC: 40 mg/dL — ABNORMAL HIGH (ref <30)

## 2014-09-21 LAB — PSA DIAGNOSTIC WITH FREE PSA REFLEX: PSA: 4.3 ng/mL — ABNORMAL HIGH (ref <=4.0)

## 2014-09-21 LAB — IRON: IRON: 90 ug/dL (ref 55–175)

## 2014-09-25 ENCOUNTER — Ambulatory Visit: Payer: Managed Care, Other (non HMO) | Attending: Family Medicine | Admitting: Family Medicine

## 2014-09-25 VITALS — BP 146/84 | HR 80 | Temp 97.2°F | Ht 68.82 in | Wt 304.2 lb

## 2014-09-25 DIAGNOSIS — Z8042 Family history of malignant neoplasm of prostate: Secondary | ICD-10-CM

## 2014-09-25 DIAGNOSIS — G4730 Sleep apnea, unspecified: Secondary | ICD-10-CM | POA: Insufficient documentation

## 2014-09-25 DIAGNOSIS — E785 Hyperlipidemia, unspecified: Principal | ICD-10-CM | POA: Insufficient documentation

## 2014-09-25 DIAGNOSIS — K5900 Constipation, unspecified: Secondary | ICD-10-CM | POA: Insufficient documentation

## 2014-09-25 DIAGNOSIS — I10 Essential (primary) hypertension: Secondary | ICD-10-CM | POA: Insufficient documentation

## 2014-09-25 DIAGNOSIS — D509 Iron deficiency anemia, unspecified: Secondary | ICD-10-CM | POA: Insufficient documentation

## 2014-09-25 DIAGNOSIS — E611 Iron deficiency: Secondary | ICD-10-CM

## 2014-09-25 DIAGNOSIS — K219 Gastro-esophageal reflux disease without esophagitis: Secondary | ICD-10-CM

## 2014-09-25 NOTE — Progress Notes (Addendum)
Subjective:     Patient ID:  Henry [name] is an 59 y.o. male   Chief Complaint:    Chief Complaint   Patient presents with    Lab Results       HPI Comments: PATIENT NAME: Henry [name], Henry [name] [facility] [id number]  DATE OF SERVICE:09/25/2014  DATE OF BIRTH: 13-Oct-1955    PROGRESS NOTE    HISTORY OF PRESENT ILLNESS:  This 59 year old gentleman is following up for chronic medical conditions.  He has a history of low iron and is on replacement.  His last iron level was on the low end of normal.  He has elevated cholesterol but his LDL and total cholesterol are at goal on medication.  He has constipation that is controlled with supplement as a result of iron replacement.  He is using his CPAP with good effect.  There is a family history, brother with prostate cancer and I reviewed his most recent PSA that is slightly elevated at 4.3.  He is on chronic nonsteroidal anti-inflammatory.  Renal function and liver functions are periodically monitored.        ALLERGIES:   -- Sulfa (Sulfonamides) -- Rash   -- Shellfish Containing Products -- Itching and Swelling    --  Shrimp - eye swelling, itching    Current Outpatient Prescriptions:  atorvastatin (LIPITOR) 20 mg Oral Tablet, Take 1 Tab (20 mg total) by mouth Every evening  diclofenac sodium (VOLTAREN) 75 mg Oral Tablet, Delayed Release (E.C.), Take 1 Tab (75 mg total) by mouth Twice daily  dicyclomine (BENTYL) 20 mg Oral Tablet, Take 1 Tab (20 mg total) by mouth Four times a day  ferrous sulfate (FEOSOL) 325 mg (65 mg iron) Oral Tablet, Take 1 Tab (325 mg total) by mouth Three times daily with meals  Ferrous Sulfate (SLOW RELEASE IRON) 142 mg (45 mg iron) Oral Tablet Sustained Release, Take 1 Tab (142 mg total) by mouth Three times a day as needed  gabapentin (NEURONTIN) 100 mg Oral Capsule, Take one capsule by mouth every night for 3 nights, then increase to 3 caps at night.  GLUCOS CHOND CPLX ADVANCED ORAL, qd  metoprolol (LOPRESSOR) 25 mg Oral Tablet, Take  1 Tab (25 mg total) by mouth Twice daily  metroNIDAZOLE (METROGEL) 1 % Gel with Pump, by Apply externally route Once a day  Omeprazole 20 mg Oral Tablet, Delayed Release (E.C.), take  by mouth.            Review of Systems   Constitutional: Negative.    Respiratory: Negative.    Cardiovascular: Negative.    Gastrointestinal: Negative.    Musculoskeletal: Positive for back pain.   Neurological:        Sleep apnea on Cpap   Psychiatric/Behavioral: Negative.      Objective:   Physical Exam   Constitutional: He is oriented to person, place, and time.   BP 146/84 mmHg   Pulse 80   Temp(Src) 36.2 C (97.2 F)   Ht 1.748 m (5' 8.82")   Wt 138 kg (304 lb 3.8 oz)   BMI 45.16 kg/m2     HENT:   Mouth/Throat: Oropharynx is clear and moist.   Eyes: Pupils are equal, round, and reactive to light.   Neck: Neck supple. No thyromegaly present.   Cardiovascular: Normal rate and regular rhythm.    Pulmonary/Chest: Breath sounds normal.   Abdominal: Bowel sounds are normal.   Musculoskeletal: He exhibits no edema.   Neurological: He is alert and oriented  to person, place, and time. He has normal reflexes.   Psychiatric: He has a normal mood and affect.     Ortho Exam    Assessment & Plan:     (E78.5) Hyperlipemia  (primary encounter diagnosis)  Plan: Profile reviewed assess at follow up      (I10) Hypertension  Plan: AST (SGOT), ALT (SGPT)            (G47.30) Sleep apnea  Plan: Sleep study this week to assess current settings    (K21.9) GERD (gastroesophageal reflux disease)  Plan:     (Z80.42) Family hx of prostate cancer  Plan: PSA, TOTAL WITH FREE PSA REFLEX            (D50.9) Low iron  Plan: IRON

## 2014-09-26 ENCOUNTER — Ambulatory Visit (HOSPITAL_BASED_OUTPATIENT_CLINIC_OR_DEPARTMENT_OTHER): Payer: Managed Care, Other (non HMO) | Admitting: NURSE PRACTITIONER

## 2014-09-26 ENCOUNTER — Encounter (HOSPITAL_BASED_OUTPATIENT_CLINIC_OR_DEPARTMENT_OTHER): Payer: BC Managed Care – PPO | Admitting: Internal Medicine

## 2014-09-26 VITALS — BP 138/85 | HR 69 | Temp 98.0°F | Resp 16 | Ht 69.0 in | Wt 305.3 lb

## 2014-09-26 DIAGNOSIS — G4730 Sleep apnea, unspecified: Secondary | ICD-10-CM

## 2014-09-26 NOTE — Progress Notes (Addendum)
[facility] [place]    Patient Name: Henry [name] [facility]  MRN#: [id number]  DOB: 08/29/1955  Date of Service: 09/26/2014    CC: Sleep Apnea        Last Clinic Visit: 10/03/13    Sleep Disorders Therapy:  Positive Pressure:  CPAP 9 cm     Mask: Nasal     Humidifier: Heated    Ramp: Yes    Oxyen: No       HPI: The patient is a 59 year old male  with history of severe sleep apnea presenting for routine follow up.No concerns or complaints. Continues to do well on therapy.     Apnea Hypopnea index:  53.8 Desat 63%(01/23/10)    Current use of positive pressure therapy: every night    Hours of sleep per night: approx 5 hr 11    Problems with Therapy    Mask: None     Machine: None    Nasal: None    Epworth Sleepiness Scale: 6    Sleep related symptoms:  Excessive day time sleepiness: Denies   Nonrestorative sleep: Denies  Witnessed apneas by bed partner: Denies   Awakening with choking: Denies   Nocturnal restlessness: Denies  Insomnia with frequent awakenings: Denies   Lack of concentration: Denies   Cognitive deficits: Denies   Changes in mood: Denies   Morning headaches: Denies   Vivid, strange, or threatening dreams Denies    Sleep structure:   - sleep latency 5 min, sleep maintenance 5 hours, Frequent night time awakening none  - excessive caffeine, soda, alcohol intake: No. Decaffeinated only       Associated diseases: Obesity (Body mass index is 45.07 kg/(m^2).), GERD, DM   Systemic complications:   Hypertension,   Cardiovascular disease,        PAP compliance report: (data card download 90days)  DME: [facility]  CPAP   Setting: 9 cm of H2O  % of sleep >=4 hours use: 92.2%  Average Usage (All Days): 5 hrs 11 min  Avg air leak time: 0 sec  Residual AHI: 0.5    PAST MEDICAL HISTORY:   Past Medical History   Diagnosis Date    HTN     Sleep apnea     CPAP (continuous positive airway pressure) dependence      settings unknown    Hypertension     Hyperlipidemia     GERD (gastroesophageal reflux disease)     well controlled on omeprazole    Heartburn     Osteoarthritis of back     Irritable bowel syndrome        ALLERGIES:   Allergies   Allergen Reactions    Sulfa (Sulfonamides) Rash    Shellfish Containing Products Itching and Swelling     Shrimp - eye swelling, itching         CURRENT MEDICATIONS:   Prior to Admission medications    Medication Sig Start Date End Date Taking? Authorizing Provider   atorvastatin (LIPITOR) 20 mg Oral Tablet Take 1 Tab (20 mg total) by mouth Every evening 08/22/14   Rosemarie Beath, MD   diclofenac sodium (VOLTAREN) 75 mg Oral Tablet, Delayed Release (E.C.) Take 1 Tab (75 mg total) by mouth Twice daily 09/04/14   Rosemarie Beath, MD   dicyclomine (BENTYL) 20 mg Oral Tablet Take 1 Tab (20 mg total) by mouth Four times a day 01/20/14   Rosemarie Beath, MD   ferrous sulfate (FEOSOL) 325 mg (65  mg iron) Oral Tablet Take 1 Tab (325 mg total) by mouth Three times daily with meals 07/07/13   Rosemarie Beath, MD   Ferrous Sulfate (SLOW RELEASE IRON) 142 mg (45 mg iron) Oral Tablet Sustained Release Take 1 Tab (142 mg total) by mouth Three times a day as needed 04/26/14   Rosemarie Beath, MD   gabapentin (NEURONTIN) 100 mg Oral Capsule Take one capsule by mouth every night for 3 nights, then increase to 3 caps at night. 08/22/14   Rosemarie Beath, MD   GLUCOS CHOND CPLX ADVANCED ORAL qd    Provider, Historical   metoprolol (LOPRESSOR) 25 mg Oral Tablet Take 1 Tab (25 mg total) by mouth Twice daily 11/22/13   Jaclyn Prime, MD   metroNIDAZOLE (METROGEL) 1 % Gel with Pump by Apply externally route Once a day    Provider, Historical   Omeprazole 20 mg Oral Tablet, Delayed Release (E.C.) take  by mouth.    Provider, Historical       REVIEW OF SYSTEMS:   Constitutional: negative  Eyes: negative  Ears, nose, mouth, throat, and face: negative  Respiratory: negative  Cardiovascular: negative  Gastrointestinal: negative  Genitourinary:negative  Integument/breast: negative  Hematologic/lymphatic:  negative  Musculoskeletal:negative  Neurological: negative  Behavioral/Psych: negative  Endocrine: negative  Allergic/Immunologic: negative    PHYSICAL EXAM: The patient's current vitals are BP 138/85 mmHg   Pulse 69   Temp(Src) 36.7 C (98 F) (Oral)   Resp 16   Ht 1.753 m (5' 9" )   Wt 138.5 kg (305 lb 5.4 oz)   BMI 45.07 kg/m2   SpO2 95%.      General: NAD, good appearance   Eyes: Conjunctiva clear, EOMI.   Oropharynx: Oropharynx Mallampati Class I  Neck: 19Inches @ cirsothyroid membrane and neck supple  Lungs: clear to auscultation bilaterally  Cardiovascular: S1, S2,  RRR without murmur  Abdomen: soft, non-tender, bowel sounds normal and non-tender   Extremities: no cyanosis, clubbing or edema  Skin: Skin warm and dry  Neurologic: grossly normal, AO x 3  Lymphatics: No nodes appreciated      ASSESSMENT/ PLAN:   Henry [name] is a 59 y.o. Male with hx of  1. Obstructive sleep apnea-   2. Morbid obesity- fitness and healthy eating habits encouraged  3. Hypertension  *Continue to do well on positive airway therapy and excellent subjective and objective compliance.  *Advise obtain replacement equipment every three to six months.   *Advise patient to avoid operating heavy machinery or equipment if experiencing fatigue, drowsiness or sleepiness.     RTC: one year     Pt was seen independently with cosigning physician available in clinic for consultation.     Lorette Ang, APRN  09/26/2014, 09:46        Gerrie Nordmann. [place], [place]  Family Nurse Practitioner  [facility] Medicine  Pulmonary and Sleep Medicine.  Department of Medicine       Pt was seen independently by APRN. I was available in clinic and available for consultation if necessary.

## 2014-09-26 NOTE — Progress Notes (Signed)
[place]                                [place], [place] [address]                                PATIENT NAME: Henry [name], Henry [name] [facility] [id number]  DATE OF SERVICE:09/25/2014  DATE OF BIRTH: [birth year]    PROGRESS NOTE    HISTORY OF PRESENT ILLNESS:  This 59 year old gentleman is following up for chronic medical conditions.  He has a history of low iron and is on replacement.  His last iron level was on the low end of normal.  He has elevated cholesterol but his LDL and total cholesterol are at goal on medication.  He has constipation that is controlled with supplement as a result of iron replacement.  He is using his CPAP with good effect.  There is a family history, brother with prostate cancer and I reviewed his most recent PSA that is slightly elevated at 4.3.  He is on chronic nonsteroidal anti-inflammatory.  Renal function and liver functions are periodically monitored.      Rosemarie Beath, MD  Professor  [facility] Department of Family Medicine    [id number]; D: 09/25/2014 16:55:42; T: 09/26/2014 12:10:06

## 2014-09-27 ENCOUNTER — Encounter (HOSPITAL_BASED_OUTPATIENT_CLINIC_OR_DEPARTMENT_OTHER): Payer: Self-pay | Admitting: Internal Medicine

## 2014-09-27 ENCOUNTER — Ambulatory Visit (HOSPITAL_COMMUNITY): Payer: Managed Care, Other (non HMO) | Admitting: Nuclear Radiology

## 2014-09-27 ENCOUNTER — Ambulatory Visit
Admission: RE | Admit: 2014-09-27 | Discharge: 2014-09-27 | Disposition: A | Discharge status: Home / Self Care | Payer: Managed Care, Other (non HMO) | Source: Ambulatory Visit | Attending: Nuclear Radiology | Admitting: Nuclear Radiology

## 2014-09-27 ENCOUNTER — Encounter (HOSPITAL_COMMUNITY)
Admission: RE | Disposition: A | Discharge status: Home / Self Care | Payer: Self-pay | Source: Ambulatory Visit | Attending: Nuclear Radiology

## 2014-09-27 DIAGNOSIS — M25552 Pain in left hip: Secondary | ICD-10-CM | POA: Insufficient documentation

## 2014-09-27 DIAGNOSIS — G4730 Sleep apnea, unspecified: Secondary | ICD-10-CM | POA: Insufficient documentation

## 2014-09-27 DIAGNOSIS — K219 Gastro-esophageal reflux disease without esophagitis: Secondary | ICD-10-CM | POA: Insufficient documentation

## 2014-09-27 DIAGNOSIS — E785 Hyperlipidemia, unspecified: Secondary | ICD-10-CM | POA: Insufficient documentation

## 2014-09-27 DIAGNOSIS — Z791 Long term (current) use of non-steroidal anti-inflammatories (NSAID): Secondary | ICD-10-CM | POA: Insufficient documentation

## 2014-09-27 DIAGNOSIS — M1612 Unilateral primary osteoarthritis, left hip: Secondary | ICD-10-CM | POA: Insufficient documentation

## 2014-09-27 DIAGNOSIS — I10 Essential (primary) hypertension: Secondary | ICD-10-CM | POA: Insufficient documentation

## 2014-09-27 DIAGNOSIS — Z79899 Other long term (current) drug therapy: Secondary | ICD-10-CM | POA: Insufficient documentation

## 2014-09-27 SURGERY — IR ARTHROGRAM - HIP LEFT
Laterality: Left

## 2014-09-27 MED ORDER — LIDOCAINE HCL 10 MG/ML (1 %) INJECTION SOLUTION
INTRAMUSCULAR | Status: AC
Start: 2014-09-27 — End: 2014-09-27
  Filled 2014-09-27: qty 20

## 2014-09-27 MED ORDER — ROPIVACAINE (PF) 5 MG/ML (0.5 %) INJECTION SOLUTION
INTRAMUSCULAR | Status: AC
Start: 2014-09-27 — End: 2014-09-27
  Filled 2014-09-27: qty 1

## 2014-09-27 MED ORDER — IOPAMIDOL 300 MG IODINE/ML (61 %) INTRAVENOUS SOLUTION
3.00 mL | INTRAVENOUS | Status: AC
Start: 2014-09-27 — End: 2014-09-27
  Administered 2014-09-27: 09:00:00 3 mL via INTRA_ARTICULAR

## 2014-09-27 MED ORDER — TRIAMCINOLONE ACETONIDE 40 MG/ML SUSPENSION FOR INJECTION
INTRAMUSCULAR | Status: AC
Start: 2014-09-27 — End: 2014-09-27
  Filled 2014-09-27: qty 5

## 2014-09-27 MED ORDER — TRIAMCINOLONE ACETONIDE 40 MG/ML SUSPENSION FOR INJECTION
60.0000 mg | Freq: Once | INTRAMUSCULAR | Status: AC
Start: 2014-09-27 — End: 2014-09-27
  Administered 2014-09-27: 60 mg via INTRA_ARTICULAR

## 2014-09-27 MED ORDER — ROPIVACAINE (PF) 5 MG/ML (0.5 %) INJECTION SOLUTION
4.0000 mL | Freq: Once | INTRAMUSCULAR | Status: AC
Start: 2014-09-27 — End: 2014-09-27
  Administered 2014-09-27: 4 mL via INTRA_ARTICULAR

## 2014-09-27 MED ORDER — LIDOCAINE HCL 10 MG/ML (1 %) INJECTION SOLUTION
8.0000 mL | Freq: Once | INTRAMUSCULAR | Status: AC
Start: 2014-09-27 — End: 2014-09-27
  Administered 2014-09-27: 80 mg

## 2014-09-27 SURGICAL SUPPLY — 1 items: CONV USE 316258 - TRAY MYLGR CLR 3.5IN 22GA SFTM_YL STD HUB SPINAL NEEDLE (KITS & TRAYS (DISPOSABLE)) ×2 IMPLANT

## 2014-09-27 NOTE — Progress Notes (Signed)
Tech sent script to DME: [facility] for supplies for CPAP 9 cmH20 heated humdification, wearing N10 mask tubing and filters as needed....Marland KitchenMarland Kitchen8/11/16 TMT

## 2014-11-08 DIAGNOSIS — G894 Chronic pain syndrome: Secondary | ICD-10-CM | POA: Diagnosis not present

## 2014-11-08 DIAGNOSIS — Z23 Encounter for immunization: Secondary | ICD-10-CM | POA: Diagnosis not present

## 2014-11-08 DIAGNOSIS — E780 Pure hypercholesterolemia: Secondary | ICD-10-CM | POA: Diagnosis not present

## 2014-11-08 DIAGNOSIS — Z0000 Encounter for general adult medical examination without abnormal findings: Secondary | ICD-10-CM | POA: Diagnosis not present

## 2014-11-08 DIAGNOSIS — Z79899 Other long term (current) drug therapy: Secondary | ICD-10-CM | POA: Diagnosis not present

## 2014-12-03 ENCOUNTER — Other Ambulatory Visit (HOSPITAL_BASED_OUTPATIENT_CLINIC_OR_DEPARTMENT_OTHER): Payer: Self-pay | Admitting: Physician Assistant

## 2014-12-03 MED ORDER — AMOXICILLIN 500 MG CAPSULE
2000.00 mg | ORAL_CAPSULE | Freq: Once | ORAL | Status: AC
Start: 2014-12-03 — End: 2014-12-03

## 2014-12-08 ENCOUNTER — Other Ambulatory Visit: Payer: Self-pay

## 2015-01-08 DIAGNOSIS — F17210 Nicotine dependence, cigarettes, uncomplicated: Secondary | ICD-10-CM | POA: Diagnosis not present

## 2015-01-08 DIAGNOSIS — G894 Chronic pain syndrome: Secondary | ICD-10-CM | POA: Diagnosis not present

## 2015-01-08 DIAGNOSIS — Z79891 Long term (current) use of opiate analgesic: Secondary | ICD-10-CM | POA: Diagnosis not present

## 2015-01-11 ENCOUNTER — Other Ambulatory Visit (HOSPITAL_BASED_OUTPATIENT_CLINIC_OR_DEPARTMENT_OTHER): Payer: Self-pay | Admitting: Family Medicine

## 2015-01-11 MED ORDER — DICYCLOMINE 20 MG TABLET
20.0000 mg | ORAL_TABLET | Freq: Four times a day (QID) | ORAL | Status: DC
Start: 2015-01-11 — End: 2016-01-19

## 2015-01-15 DIAGNOSIS — K2900 Acute gastritis without bleeding: Secondary | ICD-10-CM | POA: Diagnosis not present

## 2015-01-15 DIAGNOSIS — K219 Gastro-esophageal reflux disease without esophagitis: Secondary | ICD-10-CM | POA: Insufficient documentation

## 2015-01-28 ENCOUNTER — Ambulatory Visit: Payer: Managed Care, Other (non HMO) | Attending: Family Medicine

## 2015-01-28 DIAGNOSIS — I10 Essential (primary) hypertension: Secondary | ICD-10-CM | POA: Insufficient documentation

## 2015-01-28 DIAGNOSIS — E611 Iron deficiency: Secondary | ICD-10-CM | POA: Insufficient documentation

## 2015-01-28 DIAGNOSIS — Z8042 Family history of malignant neoplasm of prostate: Secondary | ICD-10-CM | POA: Insufficient documentation

## 2015-01-28 LAB — AST (SGOT): AST (SGOT): 27 U/L (ref 8–48)

## 2015-01-28 LAB — FREE PSA
FREE PSA/PSA RATIO: 0.37
PSA FREE: 1.7 ng/mL (ref <=2.50)
PSA FREE: 1.7 ng/mL (ref <=2.50)

## 2015-01-28 LAB — PSA DIAGNOSTIC WITH FREE PSA REFLEX: PSA: 4.6 ng/mL — ABNORMAL HIGH (ref <=4.0)

## 2015-01-28 LAB — ALT (SGPT): ALT (SGPT): 59 U/L — ABNORMAL HIGH (ref <55)

## 2015-01-28 LAB — IRON: IRON: 80 ug/dL (ref 55–175)

## 2015-01-29 ENCOUNTER — Encounter (HOSPITAL_BASED_OUTPATIENT_CLINIC_OR_DEPARTMENT_OTHER): Payer: Managed Care, Other (non HMO) | Admitting: Family Medicine

## 2015-01-31 ENCOUNTER — Other Ambulatory Visit (HOSPITAL_BASED_OUTPATIENT_CLINIC_OR_DEPARTMENT_OTHER): Payer: Self-pay | Admitting: Family Medicine

## 2015-02-05 ENCOUNTER — Ambulatory Visit (HOSPITAL_BASED_OUTPATIENT_CLINIC_OR_DEPARTMENT_OTHER): Payer: Managed Care, Other (non HMO) | Admitting: Orthopaedic Surgery

## 2015-02-05 ENCOUNTER — Encounter (HOSPITAL_BASED_OUTPATIENT_CLINIC_OR_DEPARTMENT_OTHER): Payer: Self-pay | Admitting: Orthopaedic Surgery

## 2015-02-05 ENCOUNTER — Encounter (HOSPITAL_BASED_OUTPATIENT_CLINIC_OR_DEPARTMENT_OTHER): Payer: Self-pay | Admitting: Family Medicine

## 2015-02-05 ENCOUNTER — Ambulatory Visit (HOSPITAL_BASED_OUTPATIENT_CLINIC_OR_DEPARTMENT_OTHER): Payer: Managed Care, Other (non HMO) | Admitting: Family Medicine

## 2015-02-05 ENCOUNTER — Ambulatory Visit
Admission: RE | Admit: 2015-02-05 | Discharge: 2015-02-05 | Disposition: A | Discharge status: Home / Self Care | Payer: Managed Care, Other (non HMO) | Source: Ambulatory Visit | Attending: Orthopaedic Surgery | Admitting: Orthopaedic Surgery

## 2015-02-05 VITALS — BP 146/79 | HR 70 | Temp 97.5°F | Ht 69.96 in | Wt 307.8 lb

## 2015-02-05 VITALS — BP 151/98 | HR 82 | Temp 97.3°F | Ht 69.96 in | Wt 308.2 lb

## 2015-02-05 DIAGNOSIS — M25559 Pain in unspecified hip: Secondary | ICD-10-CM

## 2015-02-05 DIAGNOSIS — M25551 Pain in right hip: Secondary | ICD-10-CM

## 2015-02-05 DIAGNOSIS — M6208 Separation of muscle (nontraumatic), other site: Secondary | ICD-10-CM

## 2015-02-05 DIAGNOSIS — Z8042 Family history of malignant neoplasm of prostate: Principal | ICD-10-CM

## 2015-02-05 DIAGNOSIS — M1612 Unilateral primary osteoarthritis, left hip: Secondary | ICD-10-CM

## 2015-02-05 DIAGNOSIS — Z96649 Presence of unspecified artificial hip joint: Secondary | ICD-10-CM

## 2015-02-05 DIAGNOSIS — D649 Anemia, unspecified: Secondary | ICD-10-CM | POA: Insufficient documentation

## 2015-02-05 DIAGNOSIS — Z96641 Presence of right artificial hip joint: Secondary | ICD-10-CM | POA: Insufficient documentation

## 2015-02-05 DIAGNOSIS — Z09 Encounter for follow-up examination after completed treatment for conditions other than malignant neoplasm: Secondary | ICD-10-CM | POA: Insufficient documentation

## 2015-02-05 NOTE — Progress Notes (Addendum)
[facility]   DEPARTMENT OF ORTHOPAEDICS    SUBJECTIVE  Henry [name] is a 59 y.o. male who returns to clinic today with a chief complaint of bilateral hip pain, right hip greater than left. 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 His left hip was injected in June 2016. He states he has been experiencing dull achy right hip pain for the past three weeks. This pain is worsened with standing for long periods of time. This pain is an 8/10 if he has been standing all day. He takes diclofenac and tylenol for pain. He is aware of signs of infection and denies recent fever or chills. He denies any clicking, popping or loosening and states his replacement feels very stable. Patient recently started doing sit-ups at home. Denies recent injury or fall.    REVIEW OF SYSTEMS  General: negative for fevers or night sweats  Cardiovascular: negative for chest pain  Respiratory: negative for shortness of breath  Gastrointestinal: negative for digestive problems  Genitourinary: negative for urinary problems  Musculoskeletal: positive for right and left hip pain      All other systems reviewed and negative    PHYSICAL EXAMINATION   Visit Vitals    BP (!) 151/98    Pulse 82    Temp 36.3 C (97.3 F)    Ht 1.777 m (5' 9.96")    Wt (!) 139.8 kg (308 lb 3.3 oz)    BMI 44.27 kg/m2     GEN:   NAD  CV:   PT and DP pulses present bilaterally.  PULM:   Normal respiratory effort.    MS:   Tenderness to palpation right posterior lateral thigh. ROM: 75, 5, 20, 20 5. Well healed scar on posterior lateral R thigh. Calves soft, supple, nontender and without erythema. No edema or erythema of right hip.  NEURO:   Alert and oriented to person, place and time.    PSYCHOSOCIAL:   Pleasant.  Normal affect.    WOUND/INCISION:   No wounds present.      RADIOGRAPHS  Radiographs were performed and reviewed at this visit.    X-Ray of  bilateral hips was interpreted by MD in clinic to show stable r total hip arthroplasty, moderate arthritis to left hip    ASSESSMENT     ICD-10-CM    1. Status post total replacement of hip, unspecified laterality Z96.649 XR HIPS B[facility]ATER[facility]INT [facility]   2. Hip pain M25.559 XR HIPS [facility]  Henry [name] is a 59 y.o. 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 We discussed lower back pain being referred to bilateral hips. Patient admits to a recent weight gain and states he is trying to shed some weight this winter. We will give him a script for PT for aerobic therapy for hips and shoulders. Signs of infection and what to watch for were reiterated. Patient was advised to do PT, aerobic training for weight loss, NSAIDs for pain control and heating pad use at home. We would like to see the patient back in 6 weeks to follow up with this recent pain.         Mauri Pole, PA-C 02/05/2015, 08:32     Well functioning total hip  Increased weight   Chronic low back pain  Will refer to PT  I personally  saw and examined the patient. See physician's assistant note for additional details.     Jacqualine Code, MD  [phone]      Cc:   Henry Beath, MD  [address] [place] [address]

## 2015-02-05 NOTE — Progress Notes (Signed)
Subjective:     Patient ID:  Corney Knighton is an 59 y.o. male   Chief Complaint:    Chief Complaint   Patient presents with    Results     PSA, Low iron       HPI Comments: PATIENT NAME: REYHAN, MORONTA [facility] [id number]  DATE OF SERVICE:02/05/2015  DATE OF BIRTH: 23-Jun-1955    PROGRESS NOTE    HISTORY OF PRESENT ILLNESS:  This is a 59 year old gentleman here in followup.  His back and shoulder discomfort is improving with rehabilitation.  He has a history of iron deficiency anemia.  He has cut back on his iron supplement.  His iron is within normal limits but dropped so he is back to his usual dose.  We will check that again in the springtime.  His brother had prostate cancer and we are following his PSA.  His last PSA was a couple of tenths  out of the reference range but not much higher than baseline.  His free PSA is well within normal limits and we have agreed to recheck in 6 months.  He is taking advantage of retirement within the next couple of weeks and will keep me posted as to how that is going.  During some of his exercise sessions while doing sit ups he noticed a mid abdominal bulge that he wants to discuss.        ALLERGIES:   -- Sulfa (Sulfonamides) -- Rash   -- Shellfish Containing Products -- Itching and Swelling    --  Shrimp - eye swelling, itching  Current Outpatient Prescriptions:  atorvastatin (LIPITOR) 20 mg Oral Tablet, Take 1 Tab (20 mg total) by mouth Every evening  diclofenac sodium (VOLTAREN) 75 mg Oral Tablet, Delayed Release (E.C.), Take 1 Tab (75 mg total) by mouth Twice daily  dicyclomine (BENTYL) 20 mg Oral Tablet, Take 1 Tab (20 mg total) by mouth Four times a day  ferrous sulfate (FEOSOL) 325 mg (65 mg iron) Oral Tablet, Take 1 Tab (325 mg total) by mouth Three times daily with meals  Ferrous Sulfate (SLOW RELEASE IRON) 142 mg (45 mg iron) Oral Tablet Sustained Release, Take 1 Tab (142 mg total) by mouth Three times a day as needed  gabapentin (NEURONTIN) 100  mg Oral Capsule, Take one capsule by mouth every night for 3 nights, then increase to 3 caps at night. (Patient not taking: Reported on 02/05/2015)  GLUCOS CHOND CPLX ADVANCED ORAL, qd  metoprolol (LOPRESSOR) 25 mg Oral Tablet, Take 1 Tab (25 mg total) by mouth Twice daily  metoprolol (LOPRESSOR) 25 mg Oral Tablet, Take 1 Tab (25 mg total) by mouth Twice daily  metroNIDAZOLE (METROGEL) 1 % Gel with Pump, by Apply externally route Once a day  Omeprazole 20 mg Oral Tablet, Delayed Release (E.C.), take  by mouth.            Review of Systems   Constitutional: Negative.    Respiratory: Negative.    Cardiovascular: Negative.    Gastrointestinal:        Mid abdominal bulge with valsalva   Skin: Negative.    Psychiatric/Behavioral: Negative.      Objective:   Physical Exam   Constitutional: He is oriented to person, place, and time. He appears well-developed and well-nourished.   BP (!) 146/79  Pulse 70  Temp 36.4 C (97.5 F) (Thermal Scan)   Ht 1.777 m (5' 9.96")  Wt (!) 139.6 kg (307 lb 12.2 oz)  SpO2  95%  BMI 44.21 kg/m2     HENT:   Mouth/Throat: Oropharynx is clear and moist.   Cardiovascular: Normal rate and regular rhythm.    Repeat blood pressure 136/84   Pulmonary/Chest: Breath sounds normal.   Abdominal: Bowel sounds are normal.   Slight mid line bulge at abdomen wit attempted sit up-   Neurological: He is alert and oriented to person, place, and time.   Psychiatric: He has a normal mood and affect.     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

## 2015-02-06 NOTE — Progress Notes (Addendum)
[place]                                [place], [place] [address]                                PATIENT NAME: MACARIUS, RUARK [facility] [id number]  DATE OF SERVICE:02/05/2015  DATE OF BIRTH: [birth year]    PROGRESS NOTE    HISTORY OF PRESENT ILLNESS:  This is a 59 year old gentleman here in followup.  His back and shoulder discomfort is improving with rehabilitation.  He has a history of iron deficiency anemia.  He has cut back on his iron supplement.  His iron is within normal limits but dropped so he is back to his usual dose.  We will check that again in the springtime.  His brother had prostate cancer and we are following his PSA.  His last PSA was a couple of tenths  out of the reference range but not much higher than baseline.  His free PSA is well within normal limits and we have agreed to recheck in 6 months.  He is taking advantage of retirement within the next couple of weeks and will keep me posted as to how that is going.  During some of his exercise sessions while doing sit ups he noticed a mid abdominal bulge that he wants to discuss.      Rosemarie Beath, MD  Professor  [place] Department of Family Medicine    [id number]; D: 02/05/2015 14:51:40; T: 02/06/2015 07:53:46  ALLERGIES:  Allergies   Allergen Reactions    Sulfa (Sulfonamides) Rash    Shellfish Containing Products Itching and Swelling     Shrimp - eye swelling, itching     Current Outpatient Prescriptions   Medication Sig    atorvastatin (LIPITOR) 20 mg Oral Tablet Take 1 Tab (20 mg total) by mouth Every evening    diclofenac sodium (VOLTAREN) 75 mg Oral Tablet, Delayed Release (E.C.) Take 1 Tab (75 mg total) by mouth Twice daily    dicyclomine (BENTYL) 20 mg Oral Tablet Take 1 Tab (20 mg total) by mouth Four times a day    ferrous sulfate (FEOSOL) 325 mg (65 mg iron) Oral Tablet Take 1 Tab (325 mg total) by  mouth Three times daily with meals    Ferrous Sulfate (SLOW RELEASE IRON) 142 mg (45 mg iron) Oral Tablet Sustained Release Take 1 Tab (142 mg total) by mouth Three times a day as needed    gabapentin (NEURONTIN) 100 mg Oral Capsule Take one capsule by mouth every night for 3 nights, then increase to 3 caps at night. (Patient not taking: Reported on 02/05/2015)    GLUCOS CHOND CPLX ADVANCED ORAL qd    metoprolol (LOPRESSOR) 25 mg Oral Tablet Take 1 Tab (25 mg total) by mouth Twice daily    metoprolol (LOPRESSOR) 25 mg Oral Tablet Take 1 Tab (25 mg total) by mouth Twice daily    metroNIDAZOLE (METROGEL) 1 % Gel with Pump by Apply externally route Once a day    Omeprazole 20 mg Oral Tablet, Delayed Release (E.C.) take  by mouth.  Subjective:     Patient ID:  Cole Eastridge is an 59 y.o. male   Chief Complaint:    Chief Complaint   Patient presents with    Results     PSA, Low iron       HPI    Review of Systems   Constitutional: Negative.    Respiratory: Negative.    Cardiovascular: Negative.    Gastrointestinal: Positive for abdominal distention.   Genitourinary: Negative.    Musculoskeletal: Positive for back pain and myalgias.   Psychiatric/Behavioral: Negative.      Objective:   Physical Exam   Constitutional: He is oriented to person, place, and time. He appears well-developed and well-nourished.   BP (!) 146/79  Pulse 70  Temp 36.4 C (97.5 F) (Thermal Scan)   Ht 1.777 m (5' 9.96")  Wt (!) 139.6 kg (307 lb 12.2 oz)  SpO2 95%  BMI 44.21 kg/m2     HENT:   Mouth/Throat: Oropharynx is clear and moist.   Eyes: Pupils are equal, round, and reactive to light.   Cardiovascular: Normal rate and regular rhythm.    Pulmonary/Chest: Breath sounds normal.   Abdominal: Bowel sounds are normal.   Musculoskeletal: Normal range of motion. He exhibits no edema, tenderness or deformity.   Neurological: He is alert and oriented to person, place, and time.   Psychiatric: He has a normal mood and affect. His  behavior is normal. Judgment and thought content normal.     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

## 2015-02-12 ENCOUNTER — Encounter (HOSPITAL_BASED_OUTPATIENT_CLINIC_OR_DEPARTMENT_OTHER): Payer: Managed Care, Other (non HMO) | Admitting: Orthopaedic Surgery

## 2015-03-19 ENCOUNTER — Ambulatory Visit (HOSPITAL_BASED_OUTPATIENT_CLINIC_OR_DEPARTMENT_OTHER): Payer: Managed Care, Other (non HMO) | Admitting: Orthopaedic Surgery

## 2015-03-19 ENCOUNTER — Ambulatory Visit
Admission: RE | Admit: 2015-03-19 | Discharge: 2015-03-19 | Disposition: A | Discharge status: Home / Self Care | Payer: Managed Care, Other (non HMO) | Source: Ambulatory Visit | Attending: Orthopaedic Surgery | Admitting: Orthopaedic Surgery

## 2015-03-19 VITALS — BP 179/101 | HR 76 | Temp 98.1°F | Ht 69.76 in | Wt 302.0 lb

## 2015-03-19 DIAGNOSIS — M545 Low back pain, unspecified: Secondary | ICD-10-CM

## 2015-03-19 DIAGNOSIS — M1990 Unspecified osteoarthritis, unspecified site: Secondary | ICD-10-CM

## 2015-03-19 DIAGNOSIS — Z6841 Body Mass Index (BMI) 40.0 and over, adult: Secondary | ICD-10-CM | POA: Insufficient documentation

## 2015-03-19 DIAGNOSIS — Z471 Aftercare following joint replacement surgery: Secondary | ICD-10-CM | POA: Insufficient documentation

## 2015-03-19 DIAGNOSIS — M4696 Unspecified inflammatory spondylopathy, lumbar region: Secondary | ICD-10-CM

## 2015-03-19 DIAGNOSIS — M4316 Spondylolisthesis, lumbar region: Secondary | ICD-10-CM

## 2015-03-19 DIAGNOSIS — Z96641 Presence of right artificial hip joint: Secondary | ICD-10-CM | POA: Insufficient documentation

## 2015-03-19 DIAGNOSIS — M5136 Other intervertebral disc degeneration, lumbar region: Secondary | ICD-10-CM

## 2015-03-19 DIAGNOSIS — R29890 Loss of height: Secondary | ICD-10-CM

## 2015-03-19 DIAGNOSIS — M4697 Unspecified inflammatory spondylopathy, lumbosacral region: Secondary | ICD-10-CM

## 2015-03-19 NOTE — Progress Notes (Addendum)
[place], [place] [address]                                PATIENT NAME: Henry, [name] [facility] NPYYFR:[id number]  DATE OF SERVICE:03/19/2015  DATE OF BIRTH: [birth year]    PROGRESS NOTE    DATE OF SURGERY:  May 22, 2013, for a right total hip arthroplasty.      SUBJECTIVE:  Henry [name] is a 60 year old male who returned to clinic today, almost 2 years out from his right total hip arthroplasty.  He was seen recently for bilateral hip pain, and it seemed to be coming more from his back.  He has been going to physical therapy and says that his pain has improved.  He says by the afternoon his hip pain is a little bit worse.  He has since semi-retired and is trying to figure out what is going to do when his severance is completed at his work.  He is taking diclofenac for pain control.  His left hip is causing zero pain at this point.  He is actively trying to lose weight, and since his last visit, he lost about 8 pounds.    OBJECTIVE:  On physical exam, Henry [name] is a well-appearing 60 year old male in no acute distress.  He is obese.  He is normocephalic, atraumatic with nonlabored breathing.  He is alert and oriented x3.  Mood and affect are normal.  Blood pressure 179/101, pulse 76, temperature 36.7 degrees Celsius, height 177.2 cm and weight 137 kg.  With regards to his right hip, the incision is well healed with no signs of infection, erythema, warmth or drainage.  He has intact ankle plantarflexion and dorsiflexion, EHL and FHL.  He has sensation intact to light touch throughout his right lower extremity.  Incisions are well healed.  He ambulates with a normal gait.    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  He has some diffuse degenerative changes.  No evidence of any  fractures or dislocations.    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  Morbid Obesity Body mass index is 43.63 kg/(m^2).    PLAN:  We explained to Henry [name] today that it seems like more of his pain is coming from his back.  It does seem almost a little bit radicular in nature that goes down the lateral side of his leg but does not go quite down to his knee.  We want him to continue going to therapy and working on range of motion.  We encouraged weight loss and abdominal strengthening and stretching.  We will plan to see him back in April 2018 for his 3-year followup from his surgery.  If he has any issues prior to then, we will be happy to see him.  All  of his questions were answered.      Laurey Arrow, MD  Resident  [place] Department of Orthopaedics    Jacqualine Code, MD  Assistant Professor  [facility] Department of Orthopaedics    PN/[id number]; D: 03/19/2015 10:21:04; T: 03/19/2015 23:04:45      I saw and examined the patient.  I reviewed the resident's note.  I agree with the findings and plan of care as documented in the resident's note.  Any exceptions/additions are edited/noted.  Peyton Bottoms MD  Assistant Professor Orthopaedic Surgery  [phone]        cc: Rosemarie Beath MD      Suanne Marker

## 2015-04-02 ENCOUNTER — Encounter (HOSPITAL_BASED_OUTPATIENT_CLINIC_OR_DEPARTMENT_OTHER): Payer: Self-pay | Admitting: Family Medicine

## 2015-04-02 NOTE — Progress Notes (Signed)
Note for patient to participate in a fitness program at [employer]. [phone]

## 2015-04-08 NOTE — Telephone Encounter (Signed)
Regarding: status of my chart request  ----- Message from Lum Babe sent at 04/08/2015 10:21 AM EST -----  Pt calling to check the status of his my chart request.  Thank you.     Clayburn Pert Torok   to Rosemarie Beath, MD        04/02/15 2:52 PM   I finished rehab (at [facility]) for my shoulders and lower back 03/29/15 and would like to continue a fitness program to help avoid a reoccurrence as much as possible. For me to join a fitness program at [facility] (due to my blood pressure & cholesterol) I need a written medical clearance from Dr Angelia Mould sent to [facility]. Please let me know if you can supply the medical clearance.

## 2015-04-10 DIAGNOSIS — E7800 Pure hypercholesterolemia, unspecified: Secondary | ICD-10-CM | POA: Diagnosis not present

## 2015-04-10 DIAGNOSIS — F332 Major depressive disorder, recurrent severe without psychotic features: Secondary | ICD-10-CM | POA: Diagnosis not present

## 2015-04-10 DIAGNOSIS — G894 Chronic pain syndrome: Secondary | ICD-10-CM | POA: Diagnosis not present

## 2015-04-12 NOTE — Telephone Encounter (Signed)
Regarding: my chart message status  ----- Message from Lum Babe sent at 04/12/2015 10:12 AM EST -----  Pt calling to check the status of the clearance.  Thank you.     Henry [name]   to Rosemarie Beath, MD        2:52 PM   I finished rehab (at [facility]) for my shoulders and lower back 03/29/15 and would like to continue a fitness program to help avoid a reoccurrence as much as possible. For me to join a fitness program at [facility] (due to my blood pressure & cholesterol) I need a written medical clearance from Dr Angelia Mould sent to [facility]. Please let me know if you can supply the medical clearance.

## 2015-06-24 DIAGNOSIS — G894 Chronic pain syndrome: Secondary | ICD-10-CM | POA: Diagnosis not present

## 2015-06-24 DIAGNOSIS — M791 Myalgia: Secondary | ICD-10-CM | POA: Diagnosis not present

## 2015-06-24 DIAGNOSIS — M5442 Lumbago with sciatica, left side: Secondary | ICD-10-CM | POA: Diagnosis not present

## 2015-06-24 DIAGNOSIS — M533 Sacrococcygeal disorders, not elsewhere classified: Secondary | ICD-10-CM | POA: Diagnosis not present

## 2015-06-24 DIAGNOSIS — M542 Cervicalgia: Secondary | ICD-10-CM | POA: Diagnosis not present

## 2015-06-25 ENCOUNTER — Ambulatory Visit: Payer: Managed Care, Other (non HMO) | Attending: Family Medicine

## 2015-06-25 DIAGNOSIS — D649 Anemia, unspecified: Secondary | ICD-10-CM | POA: Insufficient documentation

## 2015-06-25 DIAGNOSIS — Z8042 Family history of malignant neoplasm of prostate: Secondary | ICD-10-CM | POA: Insufficient documentation

## 2015-06-25 LAB — CBC
HCT: 45 % (ref 36.7–47.0)
HGB: 15.2 g/dL (ref 12.5–16.3)
MCH: 28.2 pg (ref 27.4–33.0)
MCHC: 33.7 g/dL (ref 32.5–35.8)
MCV: 83.6 fL (ref 78.0–100.0)
MPV: 7.5 fL (ref 7.5–11.5)
PLATELETS: 296 x10ˆ3/uL (ref 140–450)
RBC: 5.39 x10ˆ6/uL (ref 4.06–5.63)
RDW: 13.4 % (ref 12.0–15.0)
WBC: 9.1 x10ˆ3/uL (ref 3.5–11.0)

## 2015-06-25 LAB — IRON: IRON: 78 ug/dL (ref 55–175)

## 2015-06-25 LAB — FREE PSA
FREE PSA/PSA RATIO: 0.2
PSA FREE: 1.1 ng/mL (ref <=2.50)

## 2015-06-25 LAB — PSA DIAGNOSTIC WITH FREE PSA REFLEX: PSA: 5.5 ng/mL — ABNORMAL HIGH (ref <=4.0)

## 2015-07-02 ENCOUNTER — Encounter (HOSPITAL_BASED_OUTPATIENT_CLINIC_OR_DEPARTMENT_OTHER): Payer: Managed Care, Other (non HMO) | Admitting: Family Medicine

## 2015-07-22 DIAGNOSIS — M533 Sacrococcygeal disorders, not elsewhere classified: Secondary | ICD-10-CM | POA: Diagnosis not present

## 2015-07-22 DIAGNOSIS — M791 Myalgia: Secondary | ICD-10-CM | POA: Diagnosis not present

## 2015-07-22 DIAGNOSIS — M5442 Lumbago with sciatica, left side: Secondary | ICD-10-CM | POA: Diagnosis not present

## 2015-07-22 DIAGNOSIS — M542 Cervicalgia: Secondary | ICD-10-CM | POA: Diagnosis not present

## 2015-07-22 DIAGNOSIS — G894 Chronic pain syndrome: Secondary | ICD-10-CM | POA: Diagnosis not present

## 2015-08-16 ENCOUNTER — Other Ambulatory Visit (HOSPITAL_BASED_OUTPATIENT_CLINIC_OR_DEPARTMENT_OTHER): Payer: Self-pay | Admitting: Family Medicine

## 2015-08-27 ENCOUNTER — Ambulatory Visit: Payer: No Typology Code available for payment source | Attending: Family Medicine | Admitting: Family Medicine

## 2015-08-27 ENCOUNTER — Encounter (HOSPITAL_BASED_OUTPATIENT_CLINIC_OR_DEPARTMENT_OTHER): Payer: Self-pay | Admitting: Family Medicine

## 2015-08-27 VITALS — BP 148/90 | HR 71 | Temp 97.5°F | Ht 69.96 in | Wt 297.4 lb

## 2015-08-27 DIAGNOSIS — R030 Elevated blood-pressure reading, without diagnosis of hypertension: Secondary | ICD-10-CM

## 2015-08-27 DIAGNOSIS — I10 Essential (primary) hypertension: Secondary | ICD-10-CM | POA: Insufficient documentation

## 2015-08-27 DIAGNOSIS — Z880 Allergy status to penicillin: Secondary | ICD-10-CM | POA: Insufficient documentation

## 2015-08-27 DIAGNOSIS — IMO0001 Reserved for inherently not codable concepts without codable children: Secondary | ICD-10-CM

## 2015-08-27 DIAGNOSIS — Z79899 Other long term (current) drug therapy: Secondary | ICD-10-CM | POA: Insufficient documentation

## 2015-08-27 DIAGNOSIS — Z91013 Allergy to seafood: Secondary | ICD-10-CM | POA: Insufficient documentation

## 2015-08-27 DIAGNOSIS — Z8042 Family history of malignant neoplasm of prostate: Secondary | ICD-10-CM

## 2015-08-27 DIAGNOSIS — R972 Elevated prostate specific antigen [PSA]: Secondary | ICD-10-CM

## 2015-08-28 NOTE — Progress Notes (Signed)
Subjective:     Patient ID:  Henry [name] is an 60 y.o. male   Chief Complaint:    Chief Complaint   Patient presents with    Lab Results     follow up after blood work       HPI Comments: 60 year old gentleman here following up for an elevated PSA.  He has a family history his father was diagnosed with prostate cancer in his 40s and was treated his grandfather died of prostate cancer but was a well into his late 63s or early 54s.  Repeat PSA reveals a slight elevation total at 5.5 free PSA 1.10 his ratio of 0.20 puts him at a 18-30% likelihood that a biopsy would show cancer.  Risks benefits were discussed additional time, 15 minutes of a 25 minutes visit, was spent in coordination of care and the patient is agreeable to a referral to Urology for consideration of biopsy.    ALLERGIES:   -- Sulfa (Sulfonamides) -- Rash   -- Shellfish Containing Products -- Itching and Swelling    --  Shrimp - eye swelling, itching    Current Outpatient Prescriptions:  atorvastatin (LIPITOR) 20 mg Oral Tablet, Take 1 Tab (20 mg total) by mouth Every evening  diclofenac sodium (VOLTAREN) 75 mg Oral Tablet, Delayed Release (E.C.), TAKE 1 TABLET (75 MG TOTAL) BY MOUTH TWICE DAILY  dicyclomine (BENTYL) 20 mg Oral Tablet, Take 1 Tab (20 mg total) by mouth Four times a day  ferrous sulfate (FEOSOL) 325 mg (65 mg iron) Oral Tablet, Take 1 Tab (325 mg total) by mouth Three times daily with meals  Ferrous Sulfate (SLOW RELEASE IRON) 142 mg (45 mg iron) Oral Tablet Sustained Release, Take 1 Tab (142 mg total) by mouth Three times a day as needed  gabapentin (NEURONTIN) 100 mg Oral Capsule, Take one capsule by mouth every night for 3 nights, then increase to 3 caps at night. (Patient not taking: Reported on 02/05/2015)  GLUCOS CHOND CPLX ADVANCED ORAL, qd  metoprolol (LOPRESSOR) 25 mg Oral Tablet, Take 1 Tab (25 mg total) by mouth Twice daily  metoprolol (LOPRESSOR) 25 mg Oral Tablet, Take 1 Tab (25 mg total) by mouth Twice  daily  metroNIDAZOLE (METROGEL) 1 % Gel with Pump, by Apply externally route Once a day  Omeprazole 20 mg Oral Tablet, Delayed Release (E.C.), take  by mouth.            Review of Systems   Constitutional: Negative.    Respiratory: Negative.    Cardiovascular: Negative.    Genitourinary: Negative.      Objective:   Physical Exam   Constitutional: He is oriented to person, place, and time. He appears well-developed and well-nourished.   BP (!) 148/90 Comment: manual pressure taken   Pulse 71   Temp 36.4 C (97.5 F) (Thermal Scan)    Ht 1.777 m (5' 9.96")   Wt 134.9 kg (297 lb 6.4 oz)   SpO2 96%   BMI 42.72 kg/m2     HENT:   Mouth/Throat: Oropharynx is clear and moist.   Cardiovascular: Normal rate and regular rhythm.    Pulmonary/Chest: Breath sounds normal.   Abdominal: Bowel sounds are normal.   Neurological: He is alert and oriented to person, place, and time.   Psychiatric: He has a normal mood and affect.     Ortho Exam    Assessment & Plan:     (R97.20) Elevated PSA  (primary encounter diagnosis)  Plan: Refer to [facility]  Urology        All questions asked were answered regarding to referral to Urology and high possibility that they will suggest biopsy    (Z80.42) Family hx of prostate cancer  Plan: Refer to [facility] Urology        Same as above    (I10) Elevated blood pressure  Plan:  His blood pressures at home have been running in the low 120s to 130 over 70-84.  I will continue to follow blood pressure as he returns to the clinic.  He was a little anxious today regarding the PSA results and the referral to Urology.

## 2015-09-10 ENCOUNTER — Ambulatory Visit: Payer: No Typology Code available for payment source | Attending: Urology | Admitting: Urology

## 2015-09-10 ENCOUNTER — Encounter (INDEPENDENT_AMBULATORY_CARE_PROVIDER_SITE_OTHER): Payer: Self-pay | Admitting: Urology

## 2015-09-10 DIAGNOSIS — I10 Essential (primary) hypertension: Secondary | ICD-10-CM | POA: Insufficient documentation

## 2015-09-10 DIAGNOSIS — Z96641 Presence of right artificial hip joint: Secondary | ICD-10-CM | POA: Insufficient documentation

## 2015-09-10 DIAGNOSIS — Z79899 Other long term (current) drug therapy: Secondary | ICD-10-CM | POA: Insufficient documentation

## 2015-09-10 DIAGNOSIS — Z9989 Dependence on other enabling machines and devices: Secondary | ICD-10-CM | POA: Insufficient documentation

## 2015-09-10 DIAGNOSIS — K219 Gastro-esophageal reflux disease without esophagitis: Secondary | ICD-10-CM | POA: Insufficient documentation

## 2015-09-10 DIAGNOSIS — R972 Elevated prostate specific antigen [PSA]: Secondary | ICD-10-CM | POA: Insufficient documentation

## 2015-09-10 DIAGNOSIS — K589 Irritable bowel syndrome without diarrhea: Secondary | ICD-10-CM | POA: Insufficient documentation

## 2015-09-10 DIAGNOSIS — Z882 Allergy status to sulfonamides status: Secondary | ICD-10-CM | POA: Insufficient documentation

## 2015-09-10 DIAGNOSIS — Z91013 Allergy to seafood: Secondary | ICD-10-CM | POA: Insufficient documentation

## 2015-09-10 DIAGNOSIS — M159 Polyosteoarthritis, unspecified: Secondary | ICD-10-CM | POA: Insufficient documentation

## 2015-09-10 DIAGNOSIS — Z8042 Family history of malignant neoplasm of prostate: Secondary | ICD-10-CM | POA: Insufficient documentation

## 2015-09-10 DIAGNOSIS — G4730 Sleep apnea, unspecified: Secondary | ICD-10-CM | POA: Insufficient documentation

## 2015-09-10 DIAGNOSIS — Z01818 Encounter for other preprocedural examination: Secondary | ICD-10-CM | POA: Insufficient documentation

## 2015-09-10 DIAGNOSIS — E785 Hyperlipidemia, unspecified: Secondary | ICD-10-CM | POA: Insufficient documentation

## 2015-09-10 MED ORDER — CIPROFLOXACIN 500 MG TABLET
500.0000 mg | ORAL_TABLET | Freq: Once | ORAL | 0 refills | Status: AC
Start: 2015-09-10 — End: 2015-09-10

## 2015-09-10 NOTE — H&P (Signed)
[name] OF SURGERY  DIVISION OF UROLOGY  PRE-SURGICAL HISTORY & PHYSICAL         Henry [name]  60 y.o.  April 19, 1955  09/10/2015        Chief Complaint   Patient presents with    Elevated PSA           HPI: Henry [name] is a 60 y.o. male patient that returns today for consent and orders for local TRUS prostate bx. He is a new patient today for concerning elevated PSA. + family history of PCA in Brother and grandfather. Brother was treated with prostatectomy 3 years ago. Patient currently is asymptomaic has no complaints or concerns.   He has no GU complaints today.      Allergies:  Allergies   Allergen Reactions    Sulfa (Sulfonamides) Rash    Shellfish Containing Products Itching and Swelling     Shrimp - eye swelling, itching       Past Medical History:  Past Medical History:   Diagnosis Date    CPAP (continuous positive airway pressure) dependence     settings unknown    GERD (gastroesophageal reflux disease)     well controlled on omeprazole    Heartburn     HTN     Hyperlipidemia     Hypertension     Irritable bowel syndrome     Osteoarthritis of back     Sleep apnea            Past Surgical History:  Past Surgical History:   Procedure Laterality Date    HX ANKLE FRACTURE TX Right     HX APPENDECTOMY      HX HIP REPLACEMENT Right 05/22/13    Per Dr. Caryl Comes    PB COLONOSCOPY,DIAGNOSTIC             Family History:  Family History   Problem Relation Age of Onset    Diabetes Mother     Cancer Mother     High Cholesterol Mother     Hypertension Mother     Coronary Artery Disease Mother     Migraines Mother     Cancer Father     Asthma Father     COPD Father            Medications:    Outpatient Medications Prior to Visit:  atorvastatin (LIPITOR) 20 mg Oral Tablet Take 1 Tab (20 mg total) by mouth Every evening   diclofenac sodium (VOLTAREN) 75 mg Oral Tablet, Delayed Release (E.C.) TAKE 1 TABLET (75 MG TOTAL) BY MOUTH TWICE DAILY      dicyclomine (BENTYL) 20 mg Oral Tablet Take 1 Tab (20 mg total) by mouth Four times a day   ferrous sulfate (FEOSOL) 325 mg (65 mg iron) Oral Tablet Take 1 Tab (325 mg total) by mouth Three times daily with meals   Ferrous Sulfate (SLOW RELEASE IRON) 142 mg (45 mg iron) Oral Tablet Sustained Release Take 1 Tab (142 mg total) by mouth Three times a day as needed   gabapentin (NEURONTIN) 100 mg Oral Capsule Take one capsule by mouth every night for 3 nights, then increase to 3 caps at night. (Patient not taking: Reported on 02/05/2015)   GLUCOS CHOND CPLX ADVANCED ORAL qd   metoprolol (LOPRESSOR) 25 mg Oral Tablet Take 1 Tab (25 mg total) by mouth Twice daily   metoprolol (LOPRESSOR) 25 mg Oral Tablet Take 1 Tab (25 mg total) by mouth  Twice daily   metroNIDAZOLE (METROGEL) 1 % Gel with Pump by Apply externally route Once a day   Omeprazole 20 mg Oral Tablet, Delayed Release (E.C.) take  by mouth.     No facility-administered medications prior to visit.         Social History: pleasant married  Occupation: Retired Acupuncturist  ETOH use: social   Illegal drug use: no    ROS:  General/Constitutional-Neg.    Skin/Breast-Neg.     Eyes/Ears/Nose/Mouth/Throat-Neg.    Cardiovascular-Neg.    Respiratory -Neg.    Gastrointestinal-Neg.    Genitourinary -See HPI    Hematologic -Neg.    Endocrine -Neg.    Musculoskeletal -Neg.    Neurologic-Neg.    Psychiatric -Neg.    Allergic/Immunologic -Neg.      Vital signs:  BP (!) 151/87   Pulse 71   Temp 36.5 C (97.7 F) (Thermal Scan)    Ht 1.753 m (5' 9" )   Wt 134.8 kg (297 lb 2.9 oz)   SpO2 96% Comment: room air   BMI 43.89 kg/m2    Physical Exam:     General:  Pt is vitally stable (see values). Appears calm and at rest. Dressed appropriately.   Skin is warm and dry.    HEENT:  Eyes are clear.   Respiratory effort is unlabored.     Psych: Pt is alert and in no acute distress.      MS:  Pt is able to ambulate independently with active ROM in all extremities.        Neuro:   Neurological exam is consistent with patients age.     GI: The abdomen is soft, nontender and nondisdended. No CVAT   GU:  Genital exam DRE: 30 grams smooth soft and non nodule. .        Labs:  Results for Henry, [name] (MRN [id number]) as of 09/10/2015 08:45   Ref. Range 05/28/2011 08:13 09/21/2013 16:20 09/21/2014 09:43 01/28/2015 14:28 06/25/2015 12:07   PROSTATE SPECIFIC AG Latest Ref Range: <=4.0 ng/mL 2.8 3.8 4.3 (H) 4.6 (H) 5.5 (H)   FREE PSA Latest Ref Range: <2.5 ng/mL  1.1      FREE PSA RATIO Latest Units: %  28.9      PSA FREE Latest Ref Range: <=2.50 ng/mL   1.10 1.70 1.10         Urine:  Urine Dip Results:   Time collected: 0810  Glucose: Negative  Bilirubin: Negative  Ketones: Negative  Urine Specific Gravity: 1.015  Blood (urine): Moderate Non Hemolyzed  pH: 6.0  Protein: Negative  Urobilinogen: Normal   Nitrite: Negative  Leukocytes: Negative            Assessment:   Encounter Diagnoses   Name Primary?    Elevated PSA     Family hx of prostate cancer          Last PSA: 5.5 ( May 2017)  Highest PSA: 5.5 ( may 2017)  PSA Doubling Time: 25.41 months  PSA Density (based on [place] size): will obtain  PSA Velocity: 1.60ng/ml/yr  PCPT Calculator (% of high risk): 7%  First Biopsy/Pathology: never had   Last Biopsy/Pathology: never had  Most Recent Imaging: XR lumbar  Prostate Size (Method, Median Lobe Y/N): will obtain  Clinical Stage: cT1  AUA SS: 8 (Mild BPH  SHIM: 25, mild ED  Prior BPH Medications: none  Treatment Plan:   1: Plan on TRUS prostate bx under local. No ASA  or clearance needs  2: Bowel prep instructions given verbally. Cipro single dose to pharmacy  3: All risks and benefits explained  4: COnsent obtained. Scanned      Consent Statement.       I had a lengthy conversation with Henry [name] [facility] regarding the surgical procedure they are having completed: Procedure Local TRUS prostate bx . I explained that the risks of the procedure include but are no generally limited to: pain acute and or  chronic, infection, sepsis, bleeding, damage to the tissue and or surrounding organs, side effects and or complications of medications given in the operative setting, need for additional surgeries to correct and or repair damage from initial surgery, or to correct underlying pathology. Risk of hypertension, stroke, and or death. After this discussion Henry [name] was given the chance to ask questions regarding these risks. They had none. They are accepting of the above risks regarding surgery and are willing to proceed with procedure.     Patient was seen without cosigning MD whom was present in clinic today.     Henry Billings, APRN, AGNP-C   [name]  Department of Surgery  Division of Urology    Henry Ganja, MD  Professor and Chief  Urology Residency Program Director  Division of Urology

## 2015-09-25 ENCOUNTER — Ambulatory Visit (HOSPITAL_BASED_OUTPATIENT_CLINIC_OR_DEPARTMENT_OTHER): Payer: No Typology Code available for payment source | Admitting: NURSE PRACTITIONER

## 2015-09-25 VITALS — BP 122/86 | HR 60 | Temp 98.3°F | Resp 14 | Ht 69.0 in | Wt 296.7 lb

## 2015-09-25 DIAGNOSIS — Z6841 Body Mass Index (BMI) 40.0 and over, adult: Secondary | ICD-10-CM

## 2015-09-25 DIAGNOSIS — G4733 Obstructive sleep apnea (adult) (pediatric): Secondary | ICD-10-CM

## 2015-09-25 NOTE — Progress Notes (Signed)
[facility]    Patient Name: Henry [name] [facility]  MRN#: [id number]  DOB: 07/08/1955  Date of Service: 09/25/2015    CC: Sleep Apnea        Last Clinic Visit: 09/26/14    Sleep Disorders Therapy:  Positive Pressure:  CPAP 9 cm     Mask: Nasal     Humidifier: Heated    Ramp: Yes    Oxyen: No       HPI: The patient is a 60 year old male  with history of severe sleep apnea presenting for routine follow up.No concerns or complaints. Continues to do well on therapy.     Apnea Hypopnea index:  53.8 Desat 63%(01/23/10)    Current use of positive pressure therapy: every night    Hours of sleep per night: approx 5 hr to 6 hours a night     Problems with Therapy    Mask: None     Machine: None    Nasal: None    Epworth Sleepiness Scale:9    Sleep related symptoms:  Excessive day time sleepiness: Denies   Nonrestorative sleep: Denies  Witnessed apneas by bed partner: Denies   Awakening with choking: Denies   Nocturnal restlessness: Denies  Insomnia with frequent awakenings: Denies   Lack of concentration: Denies   Cognitive deficits: Denies   Changes in mood: Denies   Morning headaches: Denies   Vivid, strange, or threatening dreams Denies    Sleep structure:              - sleep latency 5 min, sleep maintenance 5 hours, Frequent night time awakening none  - excessive caffeine, soda, alcohol intake: No. Decaffeinated only     Associated diseases: Obesity (Body mass index is 43.82 kg/(m^2).), GERD  Systemic complications:   Hypertension,   Cardiovascular disease,    PAP compliance report:(06/27/15-09/24/15)  DME: [facility]  CPAP  Setting: 9 cm of H2O  % of sleep >=4 hours use: 85.6%  Average usage (Days Used): 5 hours 19 mins 42 secs  Avg air leak time: 0 secs  Residual AHI: 0.4    PAST MEDICAL HISTORY:   Past Medical History:   Diagnosis Date    CPAP (continuous positive airway pressure) dependence     settings unknown    GERD (gastroesophageal reflux disease)     well controlled on omeprazole     Heartburn     HTN     Hyperlipidemia     Hypertension     Irritable bowel syndrome     Osteoarthritis of back     Sleep apnea            ALLERGIES:   Allergies   Allergen Reactions    Sulfa (Sulfonamides) Rash    Shellfish Containing Products Itching and Swelling     Shrimp - eye swelling, itching         CURRENT MEDICATIONS:   Prior to Admission medications    Medication Sig Start Date End Date Taking? Authorizing Provider   atorvastatin (LIPITOR) 20 mg Oral Tablet Take 1 Tab (20 mg total) by mouth Every evening 08/22/14   Rosemarie Beath, MD   diclofenac sodium (VOLTAREN) 75 mg Oral Tablet, Delayed Release (E.C.) TAKE 1 TABLET (75 MG TOTAL) BY MOUTH TWICE DAILY 08/19/15   Rosemarie Beath, MD   dicyclomine (BENTYL) 20 mg Oral Tablet Take 1 Tab (20 mg total) by mouth Four times a day 01/11/15   Rosemarie Beath,  MD   ferrous sulfate (FEOSOL) 325 mg (65 mg iron) Oral Tablet Take 1 Tab (325 mg total) by mouth Three times daily with meals 07/07/13   Rosemarie Beath, MD   Ferrous Sulfate (SLOW RELEASE IRON) 142 mg (45 mg iron) Oral Tablet Sustained Release Take 1 Tab (142 mg total) by mouth Three times a day as needed 04/26/14   Rosemarie Beath, MD   gabapentin (NEURONTIN) 100 mg Oral Capsule Take one capsule by mouth every night for 3 nights, then increase to 3 caps at night.  Patient not taking: Reported on 02/05/2015 08/22/14   Rosemarie Beath, MD   GLUCOS CHOND CPLX ADVANCED ORAL qd    Provider, Historical   metoprolol (LOPRESSOR) 25 mg Oral Tablet Take 1 Tab (25 mg total) by mouth Twice daily 11/22/13   Jaclyn Prime, MD   metoprolol (LOPRESSOR) 25 mg Oral Tablet Take 1 Tab (25 mg total) by mouth Twice daily 01/31/15   Rosemarie Beath, MD   metroNIDAZOLE (METROGEL) 1 % Gel with Pump by Apply externally route Once a day    Provider, Historical   Omeprazole 20 mg Oral Tablet, Delayed Release (E.C.) take  by mouth.    Provider, Historical       REVIEW OF SYSTEMS:   Constitutional: negative  Eyes: negative  Ears,  nose, mouth, throat, and face: negative  Respiratory: obstructive sleep apnea  Cardiovascular: negative  Gastrointestinal: negative  Genitourinary:negative  Integument/breast: negative  Hematologic/lymphatic: negative  Musculoskeletal:negative  Neurological: negative  Behavioral/Psych: negative  Endocrine: negative  Allergic/Immunologic: negative        PHYSICAL EXAM: The patient's current vitals are BP 122/86   Pulse 60   Temp 36.8 C (98.3 F) (Thermal Scan)    Resp 14   Ht 1.753 m (5' 9" )   Wt 134.6 kg (296 lb 11.8 oz)   SpO2 96%   BMI 43.82 kg/m2.  His   General: NAD, good appearance   Eyes: Conjunctiva clear, EOMI.   Oropharynx: Oropharynx Mallampati Class I  Neck: 19Inches @ cirsothyroid membrane and neck supple  Lungs: clear to auscultation bilaterally  Cardiovascular: S1, S2,  RRR without murmur  Abdomen: soft, non-tender, bowel sounds normal and non-tender   Extremities: no cyanosis, clubbing or edema  Skin: Skin warm and dry  Neurologic: grossly normal, AO x 3  Lymphatics: No nodes appreciated    ASSESSMENT/ PLAN:   Henry [name] is a 60 y.o. Male with hx of   1. Obstructive sleep apnea-   Requested script for SO CLEAN device.   *Good response and benefit of therapy  *Apnea well controlled.   *Continue to do well on positive airway pressure therapy and excellent subjective and objective compliance.  *Advise obtain replacement equipment every three to six months.   *Advised patient do not drive or operate heavy equipment or machinery if fatigued, drowsy or sleepy due to risk of injury.   2. Morbid obesity- fitness and healthy eating habits encouraged  3. Hypertension  *Continue to do well on positive airway therapy and excellent subjective and objective compliance.  *Advise obtain replacement equipment every three to six months.   *Advise patient to avoid operating heavy machinery or equipment if experiencing fatigue, drowsiness or sleepiness.    RTC: one year     Pt was seen independently and  cosigning physician available for consultation.    Lorette Ang, APRN  09/25/2015, 11:14        Gerrie Nordmann. Wilson-Carr, APRN  Family Nurse Practitioner  [facility] Medicine  Pulmonary and Sleep Medicine.  Department of Medicine       The patient was seen independently by APRN. I was available for consultation.    Weldon Picking, MD  09/25/2015, 11:38

## 2015-09-27 ENCOUNTER — Encounter (HOSPITAL_BASED_OUTPATIENT_CLINIC_OR_DEPARTMENT_OTHER): Payer: Self-pay | Admitting: NURSE PRACTITIONER

## 2015-09-27 NOTE — Progress Notes (Signed)
Tech sent script to DME: [facility] FOR [facility]IES    CPAP 9 cmH20  Heated humidification  ZEST NASAL MASK  Headgear  Filters  Tubing  Water chamber.... 09/27/15 TMT RRT RPSGT

## 2015-10-10 ENCOUNTER — Telehealth (INDEPENDENT_AMBULATORY_CARE_PROVIDER_SITE_OTHER): Payer: Self-pay | Admitting: Urology

## 2015-10-10 NOTE — Telephone Encounter (Signed)
I attempted to contact this patient to schedule him for a procedure with Dr.  Josephine Cables          However, I was unsuccessful and left a message with my name and number for Henry [name] to return my call to schedule.

## 2015-10-18 ENCOUNTER — Other Ambulatory Visit (HOSPITAL_BASED_OUTPATIENT_CLINIC_OR_DEPARTMENT_OTHER): Payer: Self-pay | Admitting: Family Medicine

## 2015-11-07 ENCOUNTER — Encounter (HOSPITAL_COMMUNITY)
Admission: RE | Disposition: A | Discharge status: Home / Self Care | Payer: Self-pay | Source: Ambulatory Visit | Attending: Urology

## 2015-11-07 ENCOUNTER — Other Ambulatory Visit (HOSPITAL_COMMUNITY): Payer: Self-pay | Admitting: Urology

## 2015-11-07 ENCOUNTER — Inpatient Hospital Stay
Admission: RE | Admit: 2015-11-07 | Discharge: 2015-11-07 | Disposition: A | Discharge status: Home / Self Care | Payer: No Typology Code available for payment source | Source: Ambulatory Visit | Attending: Urology | Admitting: Urology

## 2015-11-07 ENCOUNTER — Ambulatory Visit (HOSPITAL_BASED_OUTPATIENT_CLINIC_OR_DEPARTMENT_OTHER): Payer: No Typology Code available for payment source | Admitting: Urology

## 2015-11-07 DIAGNOSIS — K219 Gastro-esophageal reflux disease without esophagitis: Secondary | ICD-10-CM | POA: Insufficient documentation

## 2015-11-07 DIAGNOSIS — N410 Acute prostatitis: Secondary | ICD-10-CM

## 2015-11-07 DIAGNOSIS — C61 Malignant neoplasm of prostate: Secondary | ICD-10-CM | POA: Insufficient documentation

## 2015-11-07 DIAGNOSIS — E785 Hyperlipidemia, unspecified: Secondary | ICD-10-CM | POA: Insufficient documentation

## 2015-11-07 DIAGNOSIS — Z8042 Family history of malignant neoplasm of prostate: Secondary | ICD-10-CM | POA: Insufficient documentation

## 2015-11-07 DIAGNOSIS — R972 Elevated prostate specific antigen [PSA]: Secondary | ICD-10-CM

## 2015-11-07 DIAGNOSIS — G4730 Sleep apnea, unspecified: Secondary | ICD-10-CM | POA: Insufficient documentation

## 2015-11-07 DIAGNOSIS — Z9981 Dependence on supplemental oxygen: Secondary | ICD-10-CM | POA: Insufficient documentation

## 2015-11-07 DIAGNOSIS — I10 Essential (primary) hypertension: Secondary | ICD-10-CM | POA: Insufficient documentation

## 2015-11-07 DIAGNOSIS — K589 Irritable bowel syndrome without diarrhea: Secondary | ICD-10-CM | POA: Insufficient documentation

## 2015-11-07 SURGERY — BIOPSY PROSTATE TRANSRECTAL WITH NEEDLE
Anesthesia: Local (Nurse-Monitored) | Wound class: Clean Contaminated Wounds-The respiratory, GI, Genital, or urinary

## 2015-11-07 MED ORDER — LIDOCAINE 2 % MUCOSAL JELLY IN APPLICATOR
10.00 mL | Freq: Once | Status: DC | PRN
Start: 2015-11-07 — End: 2015-11-07
  Administered 2015-11-07: 10 mL via URETHRAL

## 2015-11-07 MED ORDER — LIDOCAINE (PF) 10 MG/ML (1 %) INJECTION SOLUTION
30.00 mL | Freq: Once | INTRAMUSCULAR | Status: DC | PRN
Start: 2015-11-07 — End: 2015-11-07
  Administered 2015-11-07: 10 mL via INTRAMUSCULAR

## 2015-11-07 SURGICAL SUPPLY — 35 items
BAG DRAIN NONST LF  VNYL ACT 8MM DISP GE/OEC SYS (UROLOGICAL SUPPLIES) ×1 IMPLANT
BAG UROLOGY DRAIN CMS954VS_CS/20 (UROLOGICAL SUPPLIES) ×1
BASIN MED GRAD 32OZ BLU STRL (MISCELLANEOUS PT CARE ITEMS) ×1
DEVICE SECURE STATLK FOLEY ANCH PAD STAB TRCT SIL CATH ADULT STRL LF  DISP (UROLOGICAL SUPPLIES) IMPLANT
DEVICE SECURE STATLK FOLEY ANC_H PAD STAB TRCT SIL CATH ADLT (UROLOGICAL SUPPLIES)
DISCONTINUED USE 338708 - BASIN MED GRAD 32OZ BLU STRL (MISCELLANEOUS PT CARE ITEMS) ×1
DISCONTINUED USE 340506 - BASIN MED GRAD 32OZ BLU STRL (MISCELLANEOUS PT CARE ITEMS) ×1 IMPLANT
DISCONTINUED USE ITEM 307130 - GOWN SURG XL XLNG AAMI L4 IMPR_V BRTHBL HKLP CLSR STRL LF (PROTECTIVE PRODUCTS/GARMENTS) ×1
DISCONTINUED USE ITEM 328361 - JELLY LUB EZ BCTRST H2O SOL NG_RS FLPTP TUBE STRL 2OZ LF (MISCELLANEOUS PT CARE ITEMS) ×1 IMPLANT
DISCONTINUED USE ITEM 342873 - GOWN SURG XL XLNG AAMI L4 IMPR_V BRTHBL HKLP CLSR STRL LF (PROTECTIVE PRODUCTS/GARMENTS) ×1 IMPLANT
GOWN SURG XL AAMI L3 NONREINFO_RCE HKLP CLSR STRL LTX PNK SMS (DGOW)
GOWN SURG XL L3 NONREINFORCE HKLP CLSR STRL LTX PNK SMS 47IN (DGOW) IMPLANT
GOWN SURG XL XLNG AAMI L4 IMPR_V BRTHBL HKLP CLSR STRL LF (PROTECTIVE PRODUCTS/GARMENTS) ×1
INSTR BIOPSY PNK 25CM 18GA MXCOR 22MM ANG 2 TRGR UL SHRP TIP BVL 13.8CM STRL LF  DISP (UROLOGICAL SUPPLIES) ×1 IMPLANT
INSTR BIOPSY PNK 25CM 18GA MXC_OR 22MM ANG 2 TRGR UL SHRP TIP (UROLOGICAL SUPPLIES) ×1
JELLY LUB EZ BCTRST H2O SOL NG_RS FLPTP TUBE STRL 2OZ LF (MISCELLANEOUS PT CARE ITEMS) ×2
KIT RM TURNOVER CLEANOP CSTM INFCT CONTROL (KITS & TRAYS (DISPOSABLE)) ×1
KIT RM TURNOVER CLEANOP CSTM I_NFCT CONTROL (KITS & TRAYS (DISPOSABLE)) ×1
KIT RM TURNOVER CLEANOP CUSTOM INFCT CONTROL (KITS & TRAYS (DISPOSABLE)) ×1 IMPLANT
LABEL E-Z STICK_STLEZP1 100EA/CS (LABELS/CHART SUPPLIES) ×1
LABEL MED EZ PEEL MRKR LF (LABELS/CHART SUPPLIES) ×1 IMPLANT
NEEDLE HYPO  18GA 1.5IN REG WL BD PRCSNGL POLYPROP REG BVL LL HUB CLR CD DEHP-FR STRL LF  DISP (NEEDLES & SYRINGE SUPPLIES) ×1 IMPLANT
NEEDLE HYPO 18GA 1.5IN REG WL_BD PRCSNGL POLYPROP REG BVL (NEEDLES & SYRINGE SUPPLIES) ×1
NEEDLE SPINAL 8IN 22GA ACCUTARG QUINCKE HTLHB LL STY (NEEDLES & SYRINGE SUPPLIES) ×1 IMPLANT
NEEDLE SPINAL 8IN 22GA ACCUTAR_G QUINCKE HTLHB LL STY (NEEDLES & SYRINGE SUPPLIES) ×1
PACK SURG CSCP TBRN (DRAPE/PACKS/SHEETS/OR TOWEL) ×1
PACK SURG T SIRUS CSCP III REINF TBL CVR DRP IMPRV STRL 76X44IN 39X18IN POLY LF (DRAPE/PACKS/SHEETS/OR TOWEL) ×1 IMPLANT
PAD ARMBOARD FOAM BLU_FP-ECARM (POSITIONING PRODUCTS) ×1
PAD ARMBRD BLU (POSITIONING PRODUCTS) ×1 IMPLANT
PAD RELEASE 3 X 4 IN_1050 50/BX (WOUND CARE SUPPLY) ×1 IMPLANT
PAD RELEASE 3 X 4 IN_1050 50/BX (WOUND CARE/ENTEROSTOMAL SUPPLY) ×1
SPONGE GAUZE STRL 4 X 4IN TUB_6939 1280/CS (WOUND CARE SUPPLY) ×1 IMPLANT
SPONGE GAUZE STRL 4 X 4IN TUB_6939 1280/CS (WOUND CARE/ENTEROSTOMAL SUPPLY) ×1
SYRINGE LL 10ML LF  STRL GRAD N-PYRG DEHP-FR PVC FREE MED DISP (NEEDLES & SYRINGE SUPPLIES) ×1 IMPLANT
SYRINGE LL 10ML LF STRL MED D_ISP (NEEDLES & SYRINGE SUPPLIES) ×1

## 2015-11-07 NOTE — Nurses Notes (Signed)
After Visit Summary (AVS) and education hand-outs were given to family after instructional information was reviewed & understood.

## 2015-11-07 NOTE — Care Plan (Signed)

## 2015-11-07 NOTE — Discharge Instructions (Signed)
SURGICAL DISCHARGE INSTRUCTIONS     Dr. Josephine Cables, Benjamine Sprague, MD  performed your BIOPSY PROSTATE TRANSRECTAL WITH NEEDLE today at the [facility]:  Monday through Friday from 6 a.m. - 7 p.m.: [phone]  Between 7 p.m. - 6 a.m., weekends and holidays:  Call [facility] at [phone] or [phone].    PLEASE SEE WRITTEN HANDOUTS AS DISCUSSED BY YOUR NURSE:  See handouts    SIGNS AND SYMPTOMS OF A WOUND / INCISION INFECTION   Be sure to watch for the following:   Increase in redness or red streaks near or around the wound or incision.   Increase in pain that is intense or severe and cannot be relieved by the pain medication that your doctor has given you.   Increase in swelling that cannot be relieved by elevation of a body part, or by applying ice, if permitted.   Increase in drainage, or if yellow / green in color and smells bad. This could be on a dressing or a cast.   Increase in fever for longer than 24 hours, or an increase that is higher than 101 degrees Fahrenheit (normal body temperature is 98 degrees Fahrenheit). The incision may feel warm to the touch.    **CALL YOUR DOCTOR IF ONE OR MORE OF THESE SIGNS / SYMPTOMS SHOULD OCCUR.    ANESTHESIA INFORMATION   LOCAL ANESTHETIC:  You have receieved a local anesthetic, the effects should disappear in a few hours.    REMEMBER   If you experience any difficulty breathing, chest pain, bleeding that you feel is excessive, persistent nausea or vomiting or for any other concerns:  Call your physician Dr. Josephine Cables at [phone] or [phone]. You may also ask to have the  doctor on call paged. They are available to you 24 hours a day.    SPECIAL INSTRUCTIONS / COMMENTS       FOLLOW-UP APPOINTMENTS   Please call patient services at [phone] or [phone] to schedule a date / time of return. They are open Monday - Friday from 7:30 am - 5:00 pm.

## 2015-11-07 NOTE — OR Surgeon (Signed)
NAME :Henry [name]  AGE: 60 y.o.  DATE: 11/07/2015  DOB: 1956-01-03    Pre op diagnosis: Elevated PSA, 5.5    Post op diagnosis: Same    Procedure: TRUS biopsy of the prostate    Surgeons: Omelia Blackwater. Josephine Cables, MD    Anesthesia: 1% lidocaine without epinephrine    EBL: none    Complications: none    Drains: none    Specimens: prostate biopsies    Indications for procedure: Henry [name] is a 60 y.o. male    Operative Findings:   1. DRE revealed a 40+ gram prostate smooth, nontender, nonnodular, and symmetric.    2. The prostate measured 47.2 mm in length, 60.8 mm in width, and 37.8 mm in height for a volume of 56.8 cm3.   3. There were no hypo/hyperechoic areas within the prostate.  The bladder and seminal vesicles were normal. 6 biopsies from the right and left lobes of the prostate.  4. Median Lobe - No   5. PSA Density - 0.10    Description of procedure:  After informed consent was obtained the patient was brought back to the procedure room and placed in the left lateral decubitis position.   A DRE was then performed.  Next the ultrasound probe was advanced into the rectum and the prostate, SV's, and bladder were visualized.  Each NVB was anesthetized with lidocaine. The prostate was mapped.  2 biopsies from the right base, mid, and apex of the gland were taken and sent to pathology.  A similar procedure was carried out on the contralateral side.  The probe was removed and rectal pressure was administered revealing no evidence of hematoma.   The procedure was then terminated.  He tolerated the procedure well without any complications.      Disposition:  The patient will be discharged in stable condition.  He will follow up in 2 weeks to go over the results of his pathology. He was advised that it is not uncommon to have blood in his stool, urine, and ejaculate.  He should return to the ER if he has a fever over 101.5 degree F or intense pain.

## 2015-11-07 NOTE — Progress Notes (Signed)
[facility]  Discharge Day Note    Henry [name] [facility]  Date of service: 11/07/2015  Date of Admission:  11/07/2015  Hospital Day:  LOS: 0 days       Examination at discharge:  Vital Signs:  Temperature: 36.6 C (97.9 F) (11/07/15 1228)  Heart Rate: 74 (11/07/15 1400)  BP (Non-Invasive): (!) 170/89 (11/07/15 1400)  Respiratory Rate: 18 (11/07/15 1400)  SpO2-1: 98 % (11/07/15 1400)    Constitutional - NAD  Eyes - Clear  Neuro - AAO x 3   CV - RR  Pulm - CTA B  GI - S-NTND + BS, No flank tenderness  Skin - Warm and Dry      Assessment/ Plan:   1. Elevated PSA - S/P TRUS Prostate Biiopsy    Disposition : D/C home            Renee Rival, MD

## 2015-11-07 NOTE — H&P (Signed)
[facility]                                                     H&P Update Form    Henry [name], Henry [name], 60 y.o. male  Date of Admission:  11/07/2015  Date of Birth:  01/02/1956    11/07/2015    STOP: IF H&P IS GREATER THAN 30 DAYS FROM SURGICAL DAY COMPLETE NEW H&P IS REQUIRED.     H & P updated the day of the procedure.  1.  H&P completed within 30 days of surgical procedure by Dr. Vena Rua on 09/10/15  and has been reviewed within 24 hours of the surgery, the patient has been examined, and no change has occured in the patients condition since the H&P was completed.       Change in medications: No      Last Menstrual Period: Not applicable      Comments:     2.  Patient continues to be appropiate candidate for planned surgical procedure. YES      Renee Rival, MD        HPI: He is a new patient today for concerning elevated PSA. + family history of PCA in Brother and grandfather. Brother was treated with prostatectomy 3 years ago. Patient currently is asymptomaic has no complaints or concerns. He has no GU complaints today.      Past Medical History:   Diagnosis Date    CPAP (continuous positive airway pressure) dependence     settings unknown    GERD (gastroesophageal reflux disease)     well controlled on omeprazole    Heartburn     HTN     Hyperlipidemia     Hypertension     Irritable bowel syndrome     Osteoarthritis of back     Sleep apnea      Past Surgical History:   Procedure Laterality Date    HX ANKLE FRACTURE TX Right     HX APPENDECTOMY      HX HIP REPLACEMENT Right 05/22/13    Per Dr. Caryl Comes    PB COLONOSCOPY,DIAGNOSTIC       Social History   Substance Use Topics    Smoking status: Never Smoker    Smokeless tobacco: Never Used      Comment: very rare cigar    Alcohol use Yes      Comment: 2-3 times monthly     Allergies   Allergen Reactions    Sulfa (Sulfonamides) Rash    Shellfish Containing Products Itching and Swelling      Shrimp - eye swelling, itching     Constitutional - NAD  Eyes - Clear  Neuro - AAO x 3   CV - RR  Pulm - CTA B  GI - S-NTND + BS, No flank tenderness  Skin - Warm and Dry    A/P:    1. Elevated PSA -     To OR for trus prostate biopsy    The risks of the surgery have been explained to the patient. There is risk of heart attack, need for intensive care, blood clot, pulmonary embolus, and possibly death. There is risk of damage to the blood loss requiring blood transfusion, prolonged rehabilitation, chronic/debiliating pain, need for readmission to the hospital, loss of sensation, or erectile dysfunction. Blood in  the semen, urine, or stool for several weeks to months.  There is also a risk that the patient may have prostate cancer but may be not diagnosed during this prostate biopsy.

## 2015-11-15 LAB — HISTORICAL PROSTATE BIOPSY SPECIMENS

## 2015-11-17 DIAGNOSIS — C801 Malignant (primary) neoplasm, unspecified: Secondary | ICD-10-CM

## 2015-11-17 HISTORY — DX: Malignant (primary) neoplasm, unspecified: C80.1

## 2015-11-26 ENCOUNTER — Ambulatory Visit (HOSPITAL_BASED_OUTPATIENT_CLINIC_OR_DEPARTMENT_OTHER): Payer: No Typology Code available for payment source

## 2015-11-26 ENCOUNTER — Ambulatory Visit: Payer: No Typology Code available for payment source | Attending: Urology | Admitting: Urology

## 2015-11-26 VITALS — BP 130/82 | HR 66 | Temp 98.8°F | Resp 20 | Ht 69.0 in | Wt 300.5 lb

## 2015-11-26 DIAGNOSIS — E669 Obesity, unspecified: Secondary | ICD-10-CM | POA: Insufficient documentation

## 2015-11-26 DIAGNOSIS — Z6841 Body Mass Index (BMI) 40.0 and over, adult: Secondary | ICD-10-CM | POA: Insufficient documentation

## 2015-11-26 DIAGNOSIS — I10 Essential (primary) hypertension: Secondary | ICD-10-CM | POA: Insufficient documentation

## 2015-11-26 DIAGNOSIS — C61 Malignant neoplasm of prostate: Secondary | ICD-10-CM | POA: Insufficient documentation

## 2015-11-26 DIAGNOSIS — G4733 Obstructive sleep apnea (adult) (pediatric): Secondary | ICD-10-CM | POA: Insufficient documentation

## 2015-11-26 DIAGNOSIS — Z9989 Dependence on other enabling machines and devices: Secondary | ICD-10-CM | POA: Insufficient documentation

## 2015-11-26 DIAGNOSIS — M1990 Unspecified osteoarthritis, unspecified site: Secondary | ICD-10-CM | POA: Insufficient documentation

## 2015-11-26 DIAGNOSIS — E785 Hyperlipidemia, unspecified: Secondary | ICD-10-CM | POA: Insufficient documentation

## 2015-11-26 DIAGNOSIS — K219 Gastro-esophageal reflux disease without esophagitis: Secondary | ICD-10-CM | POA: Insufficient documentation

## 2015-11-26 DIAGNOSIS — R972 Elevated prostate specific antigen [PSA]: Secondary | ICD-10-CM | POA: Insufficient documentation

## 2015-11-26 NOTE — Progress Notes (Signed)
[name]  PROGRESS NOTE    Name: Henry [name]  MRN: [id number]  Date of Birth: 1955/11/20  Date of Consultation: 11/26/2015    CC: Review results of prostate biopsy     SUBJECTIVE: Henry [name] is a 60 y.o. male with a h/o HTN, HLD, GERD, arthritis, OSA (on CPAP), obesity (BMI 44), and an elevated PSA (5.5) who presents to [facility] today review results of recent prostate biopsy.  Patient underwent transrectal ultrasound-guided prostate biopsy by Dr. Josephine Cables on 11/07/2015.  The pathology results were positive for prostate cancer Gleason 3 + 4 = 7 involving 1 core and 3 additional cores of Gleason 3 + 3 = 6 on the left side as well as multiple atypical foci on the right side. The patient has recovered well from the biopsy, with no issues today. He has looked into the different treatments for prostate cancer, but is unsure which treatment he would like to pursue at this time.     Prostate Cancer Summary:  Clinical Stage: cT1cPXMX  Pathologic Stage: NA  Prostate Biopsy: Gleason 3 + 4 = 7 (11/07/15) in 1 core and Gleason 3 + 3 = 6 in 3 cores on left, multiple foci of atypical cells on the right  Local Treatment: None  Hormonal Therapy: None  Last PSA: 5.5  PSA at Diagnosis: 5.5  PSA Doubling Time: NA  PSA Density (based on [place] size): 0.10  Most Recent Imaging: TRUS 56.8 cm3   Prostate Size (Method, Median Lobe Y/N): 56.8 cm3, no median lobe  AUA SS: 4 (mild BPH symptoms), QOL 1 (pleased)  SHIM: 25 (no degree of ED)  Prior BPH Medications: None    ROS:  See subjective for pertinent GU symptoms, all other systems negative.    PHYSICAL EXAM:  BP 130/82   Pulse 66   Temp 37.1 C (98.8 F)   Resp 20   Ht 1.753 m (5' 9" )   Wt (!) 136.3 kg (300 lb 7.8 oz)   BMI 44.37 kg/m2  VS: Appears vitally stable  General - 60 y.o. male, NAD, obese  Lungs - Unlabored respiratory effort  Abdomen - Soft, NT/ND, obese (no significant pannus), right lower quadrant scar from  open appendectomy  Musculoskeletal - FROM in all 4 extremities  Neurological - CN II-XII Grossly intact  GU system - No CVAT bilaterally. No suprapubic fullness or tenderness.  Skin - Warm and dry   Psych - Normal affect      [facility]    [address], [place] [address]    Phone: [phone]          Fax: [phone]        Surgical Pathology Report        Patient Name: Henry, [name]. Accession #: [id number] Med. Rec. #: [id number]  Client: Rojelio Brenner Taken: 11/07/2015 DOB: [birth year] (Age: 44) Location: [facility]  Received: 11/07/2015 Gender: M Service: Urology Reported: 11/15/2015  Billing #:  [id number]  Physician(s): Jalen Daluz Salley Hews, M.D.  Copy To:    Specimen(s) Received     A:RIGHT LATERAL APEX PROSTATE BIOPSY    B:RIGHT MEDIAL APEX PROSTATE BIOPSY    C:RIGHT LATERAL MID PROSTATE BIOPSY    D:RIGHT MEDIAL MID PROSTATE BIOPSY    E:RIGHT LATERAL BASE PROSTATE BIOPSY    F:RIGHT MEDIAL BASE PROSTATE BIOPSY    G:LEFT LATERAL APEX PROSTATE BIOPSY    H:LEFT  MEDIAL APEX PROSTATE BIOPSY    I:LEFT LATERAL MID PROSTATE BIOPSY    J:LEFT MEDIAL MID PROSTATE BIOPSY    K:LEFT LATERAL BASE PROSTATE BIOPSY    L:LEFT MEDIAL BASE PROSTATE BIOPSY          Final Pathologic Diagnosis    A. RIGHT PROSTATE, LATERAL APEX, BIOPSY:    - Prostate tissue with no evidence of malignancy.     B. RIGHT PROSTATE, MEDIAL APEX, BIOPSY:    - Prostate tissue with no evidence of malignancy.    C. RIGHT PROSTATE, LATERAL MID, BIOPSY:    - Small focus of atypical glands suspicious for adenocarcinoma.    D. RIGHT PROSTATE, MEDIAL MID, BIOPSY:    - Focal acute prostatitis.    - No evidence of malignancy.    E. RIGHT PROSTATE, LATERAL BASE, BIOPSY:    - Small focus of atypical glands suspicious for adenocarcinoma.    F. RIGHT PROSTATE, MEDIAL BASE, BIOPSY:    - Prostate tissue with no evidence  of malignancy.    G. LEFT PROSTATE, LATERAL APEX, BIOPSY:    - Prostate tissue with no evidence of malignancy.    H. LEFT PROSTATE, MEDIAL APEX, BIOPSY:    - Small focus of adenocarcinoma, Gleason pattern 3.    I. LEFT PROSTATE, LATERAL MID, BIOPSY:    - Prostate tissue with no evidence of malignancy.    J. LEFT PROSTATE, MEDIAL MID, BIOPSY:    - Adenocarcinoma, Gleason score 3+3=6, involving one of one core (10% of  the submitted tissue).    K. LEFT PROSTATE, LATERAL BASE, BIOPSY:    - Adenocarcinoma, Gleason score 3+3=6, involving one of one core (5% of  the submitted tissue).    L. LEFT PROSTATE, MEDIAL BASE, BIOPSY:    - Adenocarcinoma, Gleason score 3+4=7, involving one of one core (10% of  the submitted tissue).        lctr - 11/15/2015 Electronically Signed By: Floyce Stakes, MD       Microscopic  Performed.      Clinical History  Pre-Op Diagnosis: Elevated PSA.           ASSESSMENT:  60 y.o. male with:    1. 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 Co-morbidities include HTN, HLD, GERD, arthritis, OSA (on CPAP), obesity (BMI 44)    PLAN:  We reviewed the pathology results with the patient and had a long discussion regarding the risks and benefits of radiation therapy vs radical prostatectomy vs active surveillance   All questions were answered   Provided him with patient information from [employer] regarding prostate cancer diagnosis and treatment options  The patient would like to consider his options and discuss with his family   Dr. Josephine Cables will arrange for the patient to meet with Dr. Lenore Manner to discuss risks and benefits of radiation therapy   The patient will return to clinic in 2 weeks with appointments with Urology and Radiation Oncology to further discuss treatment plan    Lenore Cordia, MD 11/26/2015, 12:32  [name]  Division of Urology Resident                  Late entry for 11/26/15. I saw and examined the patient.  I reviewed the resident's note.  I agree with the findings and plan of care as documented in the resident's note.  Any exceptions/additions are edited/noted.    Renee Rival, MD

## 2015-11-30 ENCOUNTER — Other Ambulatory Visit: Payer: Self-pay

## 2015-12-04 ENCOUNTER — Ambulatory Visit (HOSPITAL_COMMUNITY): Payer: No Typology Code available for payment source | Admitting: Radiation Oncology

## 2015-12-08 ENCOUNTER — Other Ambulatory Visit (HOSPITAL_BASED_OUTPATIENT_CLINIC_OR_DEPARTMENT_OTHER): Payer: Self-pay | Admitting: Family Medicine

## 2015-12-10 ENCOUNTER — Ambulatory Visit (HOSPITAL_BASED_OUTPATIENT_CLINIC_OR_DEPARTMENT_OTHER): Payer: No Typology Code available for payment source

## 2015-12-10 ENCOUNTER — Encounter (HOSPITAL_BASED_OUTPATIENT_CLINIC_OR_DEPARTMENT_OTHER): Payer: Self-pay | Admitting: Urology

## 2015-12-10 ENCOUNTER — Ambulatory Visit (HOSPITAL_BASED_OUTPATIENT_CLINIC_OR_DEPARTMENT_OTHER): Payer: No Typology Code available for payment source | Admitting: Urology

## 2015-12-10 ENCOUNTER — Inpatient Hospital Stay (HOSPITAL_BASED_OUTPATIENT_CLINIC_OR_DEPARTMENT_OTHER)
Admission: RE | Admit: 2015-12-10 | Discharge: 2015-12-10 | Disposition: A | Discharge status: Still In Hospital | Payer: No Typology Code available for payment source | Source: Ambulatory Visit | Admitting: Radiation Oncology

## 2015-12-10 DIAGNOSIS — I10 Essential (primary) hypertension: Secondary | ICD-10-CM | POA: Insufficient documentation

## 2015-12-10 DIAGNOSIS — Z802 Family history of malignant neoplasm of other respiratory and intrathoracic organs: Secondary | ICD-10-CM | POA: Insufficient documentation

## 2015-12-10 DIAGNOSIS — Z8489 Family history of other specified conditions: Secondary | ICD-10-CM | POA: Insufficient documentation

## 2015-12-10 DIAGNOSIS — Z833 Family history of diabetes mellitus: Secondary | ICD-10-CM | POA: Insufficient documentation

## 2015-12-10 DIAGNOSIS — Z6841 Body Mass Index (BMI) 40.0 and over, adult: Secondary | ICD-10-CM | POA: Insufficient documentation

## 2015-12-10 DIAGNOSIS — Z8042 Family history of malignant neoplasm of prostate: Secondary | ICD-10-CM | POA: Insufficient documentation

## 2015-12-10 DIAGNOSIS — Z825 Family history of asthma and other chronic lower respiratory diseases: Secondary | ICD-10-CM | POA: Insufficient documentation

## 2015-12-10 DIAGNOSIS — C61 Malignant neoplasm of prostate: Secondary | ICD-10-CM

## 2015-12-10 DIAGNOSIS — Z01818 Encounter for other preprocedural examination: Secondary | ICD-10-CM | POA: Insufficient documentation

## 2015-12-10 DIAGNOSIS — E785 Hyperlipidemia, unspecified: Secondary | ICD-10-CM | POA: Insufficient documentation

## 2015-12-10 DIAGNOSIS — R12 Heartburn: Secondary | ICD-10-CM | POA: Insufficient documentation

## 2015-12-10 DIAGNOSIS — Z79899 Other long term (current) drug therapy: Secondary | ICD-10-CM | POA: Insufficient documentation

## 2015-12-10 DIAGNOSIS — Z9989 Dependence on other enabling machines and devices: Secondary | ICD-10-CM | POA: Insufficient documentation

## 2015-12-10 DIAGNOSIS — Z8342 Family history of familial hypercholesterolemia: Secondary | ICD-10-CM | POA: Insufficient documentation

## 2015-12-10 DIAGNOSIS — K589 Irritable bowel syndrome without diarrhea: Secondary | ICD-10-CM | POA: Insufficient documentation

## 2015-12-10 DIAGNOSIS — E669 Obesity, unspecified: Secondary | ICD-10-CM | POA: Insufficient documentation

## 2015-12-10 DIAGNOSIS — K219 Gastro-esophageal reflux disease without esophagitis: Secondary | ICD-10-CM | POA: Insufficient documentation

## 2015-12-10 DIAGNOSIS — Z5112 Encounter for antineoplastic immunotherapy: Secondary | ICD-10-CM | POA: Insufficient documentation

## 2015-12-10 DIAGNOSIS — G4733 Obstructive sleep apnea (adult) (pediatric): Secondary | ICD-10-CM | POA: Insufficient documentation

## 2015-12-10 DIAGNOSIS — N410 Acute prostatitis: Secondary | ICD-10-CM | POA: Insufficient documentation

## 2015-12-10 DIAGNOSIS — Z8249 Family history of ischemic heart disease and other diseases of the circulatory system: Secondary | ICD-10-CM | POA: Insufficient documentation

## 2015-12-10 DIAGNOSIS — Z9889 Other specified postprocedural states: Secondary | ICD-10-CM | POA: Insufficient documentation

## 2015-12-10 DIAGNOSIS — M479 Spondylosis, unspecified: Secondary | ICD-10-CM | POA: Insufficient documentation

## 2015-12-10 NOTE — Cancer Center Note (Addendum)
[place]  RADIATION/ONCOLOGY CONSULTATION    Patient: Henry [name] MRN: [id number] Date of Birth: 13-Feb-1956       Subjective:     Diagnosis/Stage: favorable int risk prostate cancer (T1c, GS3+4, PSA 5.5)  Chief Complaint:  Rising PSA    History of Present Illness (+ relevant laboratory, imaging and pathologic findings):   Henry [name] is a 60 y.o. male with a history of rising PSA over the past few years, reaching as high as 5.5 on 06/25/15.  Prostate biopsy on 11/07/15 showed Adenocarcinoma GS3+4 in 1 cores (L med base, 10% involved) and GS6 in 2 cores (L lat base and L medial mid, 5-10% involvement). He denies any problems with urination and has never been on medication for BPH. He has good sexual function. Bowel movements are normal and he has had a colonoscopy in the past few years which was normal. He denies any other symptoms.       Past Medical/Surgical History:  Past Medical History:   Diagnosis Date    CPAP (continuous positive airway pressure) dependence     settings unknown    GERD (gastroesophageal reflux disease)     well controlled on omeprazole    Heartburn     HTN     Hyperlipidemia     Hypertension     Irritable bowel syndrome     Osteoarthritis of back     Sleep apnea          Past Surgical History:   Procedure Laterality Date    HX ANKLE FRACTURE TX Right     HX APPENDECTOMY      HX HIP REPLACEMENT Right 05/22/13    Per Dr. Caryl Comes    PB COLONOSCOPY,DIAGNOSTIC           Medication History:  Current Outpatient Prescriptions   Medication Sig    atorvastatin (LIPITOR) 20 mg Oral Tablet TAKE 1 TAB (20 MG TOTAL) BY MOUTH EVERY EVENING    diclofenac sodium (VOLTAREN) 75 mg Oral Tablet, Delayed Release (E.C.) TAKE 1 TABLET (75 MG TOTAL) BY MOUTH TWICE DAILY    dicyclomine (BENTYL) 20 mg Oral Tablet Take 1 Tab (20 mg total) by mouth Four times a day    ferrous sulfate (FEOSOL) 325 mg (65 mg iron) Oral Tablet Take 1 Tab (325 mg total) by mouth Three times  daily with meals    Ferrous Sulfate (SLOW RELEASE IRON) 142 mg (45 mg iron) Oral Tablet Sustained Release Take 1 Tab (142 mg total) by mouth Three times a day as needed    gabapentin (NEURONTIN) 100 mg Oral Capsule Take one capsule by mouth every night for 3 nights, then increase to 3 caps at night. (Patient not taking: Reported on 02/05/2015)    GLUCOS CHOND CPLX ADVANCED ORAL qd    metoprolol (LOPRESSOR) 25 mg Oral Tablet Take 1 Tab (25 mg total) by mouth Twice daily    metoprolol (LOPRESSOR) 25 mg Oral Tablet Take 1 Tab (25 mg total) by mouth Twice daily    metroNIDAZOLE (METROGEL) 1 % Gel with Pump by Apply externally route Once a day    Omeprazole 20 mg Oral Tablet, Delayed Release (E.C.) take  by mouth.     Allergies   Allergen Reactions    Sulfa (Sulfonamides) Rash    Shellfish Containing Products Itching and Swelling     Shrimp - eye swelling, itching     Family History:   Family Medical History     Problem  Relation (Age of Onset)    Asthma Father    COPD Father    Cancer Mother, Father    Coronary Artery Disease Mother    Diabetes Mother    High Cholesterol Mother    Hypertension Mother    Migraines Mother      Brother with prostate cancer s/p RP  Grandfather prostate cancer at old age  Mother with ovarian cancer  Father with laryngeal cancer    Social History:   Social History     Social History    Marital status: Married     Spouse name: N/A    Number of children: N/A    Years of education: N/A     Occupational History     [employer]     Social History Main Topics    Smoking status: Never Smoker    Smokeless tobacco: Never Used      Comment: very rare cigar    Alcohol use Yes      Comment: 2-3 times monthly    Drug use: No    Sexual activity: Not on file     Other Topics Concern    Abuse/Domestic Violence No    Caffeine Concern No    Calcium Intake Adequate Yes    Computer Use Yes    Drives Yes    Exercise Concern No    Seat Belt Yes    Special Diet No    Sunscreen Used Yes       Right Hand Dominant Yes    Routine Exercise No    Ability To Walk 2 Flight Of Steps Without Sob/Cp Yes    Ability To Do Own Adl's Yes    Other Activity Level Yes     14 steps at home. walks a lot. shoveled snow this winter. limited by pain.     Social History Narrative   Occasional alcohol  Barrister's clerk for [employer]      Review of Systems:  Constitutional:  negative for fevers, chills, fatigue, anorexia and weight loss  Eyes:  negative for visual disturbance and irritation  Ears, nose, mouth, throat:  negative for hearing loss, nasal congestion, sore throat, hoarseness and voice change  Cardiac/vascular:  negative for chest pain, palpitations and lower extremity edema  Respiratory:  negative for cough, hemoptysis, pleurisy/chest pain or dyspnea on exertion  Gastrointestinal:  negative for dysphagia, nausea, vomiting, change in bowel habits, melena, diarrhea and abdominal pain  Genitourinary:  negative for dysuria, urinary incontinence and hematuria  Musculoskeletal:  negative for myalgias, neck pain, back pain, muscle weakness and bone pain  Neurological:  negative for headaches, dizziness, seizures, speech problems, gait problems and weakness  Behavioral/Psych:  negative for behavior problems  Hematologic/lymphatic:  negative for bleeding and lymphadenopathy  Integumentary (skin, breast):  negative for rash, skin lesion(s), pruritus and dryness  Endocrine:  negative  Allergic/Immunologic:  negative      Objective:   Most Recent Vitals       Office Visit from 11/26/2015 in Urology Oncology Clinic, [facility]    Temperature 37.1 C (98.8 F) filed at... 11/26/2015 1139    Heart Rate 66 filed at... 11/26/2015 1139    Respiratory Rate 20 filed at... 11/26/2015 1139    BP (Non-Invasive) 130/82 filed at... 11/26/2015 1139    Height 1.753 m (5' 9" ) filed at... 11/26/2015 1139    Weight (!)  136.3 kg (300 lb 7.8 oz) filed at... 11/26/2015 1139    BMI (Calculated) 44.47 filed  at... 11/26/2015 1139    BSA  (Calculated) 2.58 filed at... 11/26/2015 1139      CONSTITUTIONAL: No acute distress, KPS 100%  EYES: conjunctiva clear, pupils equal round and reactive to light  ENT: Normocephalic, atraumatic, moist oral cavity mucous membranes  NECK: No masses or palpable lymphadenopathy  RESPIRATORY: Clear to auscultation bilaterally  CARDIOVASCULAR:Regular rate and rhythm, no murmurs appreciated.  GASTROINTESTINAL: Soft, non-tender, non-distended, no palpable hepatosplenomegaly or masses.    GENITOURINARY:  No palpable prostate nodularity  MUSCULOSKELETAL:  No tenderness to palpation in joints or spine.    NEUROLOGIC: AOx3, cranial nerves II-XII intact, grossly normal motor and sensory exams  LYMPHATICS: No palpable lymphadenopathy  PSYCHIATRIC: Pleasant, normal affect      Assessment/Recommendations:     59M with favorable int risk prostate cancer (T1c, GS3+4, PSA 5.5). We discussed his treatment options, including active surveillance, radical prostatectomy, EBRT+/-ADT, brachytherapy, and EBRT+brachytherapy. We also discussed use of the SpaceOAR to prevent rectal toxicity if he elects to receive radiation (either external or implant). He appears to be favoring external beam RT + 6 months ADT at the moment due to its non-invasive nature, but he will discuss further with Dr. Josephine Cables today and consider his options before making a final decision.     The indications, time course, benefits, risks and side effects of radiation treatment were explained to the patient, and his questions were answered to his apparent satisfaction. I encouraged him to contact us at any time should he have any further questions or concerns. I personally saw and examined the patient, and reviewed all prior imaging and pathologic findings with him. I spent greater than 50% of a 60 minute visit in discussion of the patient's diagnosis and management.    Rexanne Mano. Lenore Manner, MD 12/10/2015, 14:22

## 2015-12-10 NOTE — Progress Notes (Signed)
[place]  PROGRESS NOTE    Name: Henry [name]  MRN: [id number]  Date of Birth: 12-17-1955  Date of Consultation: 12/10/2015    CC: Intermediate risks prostate cancer.     SUBJECTIVE: Henry [name] is a 60 y.o. male with a h/o HTN, HLD, GERD, arthritis, OSA (on CPAP), obesity (BMI 44), and an elevated PSA (5.5) who presents to [facility] today reviewhis final decision regarding treatment options for his prostate cancer.   Patient underwent transrectal ultrasound-guided prostate biopsy by Dr. Josephine Cables on 11/07/2015.  The pathology results were positive for prostate cancer Gleason 3 + 4 = 7 involving 1 core and 3 additional cores of Gleason 3 + 3 = 6 on the left side as well as multiple atypical foci on the right side. He saw Dr Lenore Manner today as well, and is considering EBRT.  He has several questions today regarding possible EBRT vs surgery.           ROS:  See subjective for pertinent GU symptoms, all other systems negative.    PHYSICAL EXAM:  There were no vitals taken for this visit.  VS: Appears vitally stable  General - 60 y.o. male, NAD, obese  Lungs - Unlabored respiratory effort  Abdomen - Soft, NT/ND, obese (no significant pannus), right lower quadrant scar from open appendectomy  Musculoskeletal - FROM in all 4 extremities  Neurological - CN II-XII Grossly intact  GU system - No CVAT bilaterally. No suprapubic fullness or tenderness.  Skin - Warm and dry   Psych - Normal affect      [facility]    [place], [place] [address]    Phone: [phone]          Fax: [phone]        Surgical Pathology Report        Patient Name: Henry [name]. Accession #: [id number] Med. Rec. #: [id number]  Client: Rojelio Brenner Taken: 11/07/2015 DOB: 1955-12-09 (Age: 61) Location: [facility]  Received: 11/07/2015 Gender: M Service: Urology Reported: 11/15/2015  Billing #:  [id number]   Physician(s): Aniel Hubble Salley Hews, M.D.  Copy To:    Specimen(s) Received     A:RIGHT LATERAL APEX PROSTATE BIOPSY    B:RIGHT MEDIAL APEX PROSTATE BIOPSY    C:RIGHT LATERAL MID PROSTATE BIOPSY    D:RIGHT MEDIAL MID PROSTATE BIOPSY    E:RIGHT LATERAL BASE PROSTATE BIOPSY    F:RIGHT MEDIAL BASE PROSTATE BIOPSY    G:LEFT LATERAL APEX PROSTATE BIOPSY    H:LEFT MEDIAL APEX PROSTATE BIOPSY    I:LEFT LATERAL MID PROSTATE BIOPSY    J:LEFT MEDIAL MID PROSTATE BIOPSY    K:LEFT LATERAL BASE PROSTATE BIOPSY    L:LEFT MEDIAL BASE PROSTATE BIOPSY          Final Pathologic Diagnosis    A. RIGHT PROSTATE, LATERAL APEX, BIOPSY:    - Prostate tissue with no evidence of malignancy.     B. RIGHT PROSTATE, MEDIAL APEX, BIOPSY:    - Prostate tissue with no evidence of malignancy.    C. RIGHT PROSTATE, LATERAL MID, BIOPSY:    - Small focus of atypical glands suspicious for adenocarcinoma.    D. RIGHT PROSTATE, MEDIAL MID, BIOPSY:    - Focal acute prostatitis.    - No evidence of malignancy.    E. RIGHT PROSTATE, LATERAL BASE, BIOPSY:    - Small focus of atypical glands suspicious  for adenocarcinoma.    F. RIGHT PROSTATE, MEDIAL BASE, BIOPSY:    - Prostate tissue with no evidence of malignancy.    G. LEFT PROSTATE, LATERAL APEX, BIOPSY:    - Prostate tissue with no evidence of malignancy.    H. LEFT PROSTATE, MEDIAL APEX, BIOPSY:    - Small focus of adenocarcinoma, Gleason pattern 3.    I. LEFT PROSTATE, LATERAL MID, BIOPSY:    - Prostate tissue with no evidence of malignancy.    J. LEFT PROSTATE, MEDIAL MID, BIOPSY:    - Adenocarcinoma, Gleason score 3+3=6, involving one of one core (10% of  the submitted tissue).    K. LEFT PROSTATE, LATERAL BASE, BIOPSY:    - Adenocarcinoma, Gleason score 3+3=6, involving one of one core (5% of  the submitted tissue).    L. LEFT PROSTATE, MEDIAL BASE, BIOPSY:    - Adenocarcinoma,  Gleason score 3+4=7, involving one of one core (10% of  the submitted tissue).        lctr - 11/15/2015 Electronically Signed By: Floyce Stakes, MD       Microscopic  Performed.      Clinical History  Pre-Op Diagnosis: Elevated PSA.           ASSESSMENT:  60 y.o. male with:    1. 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 Co-morbidities include HTN, HLD, GERD, arthritis, OSA (on CPAP), obesity (BMI 44)      ASSESSMENT/PLAN:    Last PSA: 5.5 (06-2015)   Highest PSA: 5.5 (06-2015)   PSA Doubling Time: 25 months  PSA Density (based on [place] size): 0.10  PSA Velocity: 0.1ng/ml/cc  PCPT Calculator (% of high risk): 7%  First Biopsy/Pathology: 10-2015  Last Biopsy/Pathology: Intermediate risks prostate cancer 3=4=7   Most Recent Imaging: XR lumbar spine 2017   Prostate Size (Method, Median Lobe Y/N): 56.8cm3  Clinical Stage: cT1c   AUA SS: 8  SHIM: 25  Prior BPH Medications: active  Treatment Plan:   1:  Long conversation had regarding the options. Patient may benefit from radiation given his high BMI. Surgery is an option though.  2: Business card provided, if patient elects surgery he can contact us. If he decides to follow with radiation oncology, he can contact them.           Patient seen as a shared visit today with Dr Josephine Cables.    Rosalene Billings, APRN, AGNP-C  Jarvis Newcomer MD  Staff.   [facility]  [facility]     Spent 13 at of 25 minutes visit with patient regarding his prostate cancer, education and question answering regarding surgical versus nonsurgical options, and discussion of patient's final decision.      Late entry for 12/10/15. I personally saw and evaluated the patient. See mid-level's note for additional details. My findings/participation are     -At this current time, the patient is leaning towards radiation. He does not want any official treatment plans made, he states he will call us when he has made a decision.          The risks of the surgery have been explained to the patient. There is risk of heart attack, prolonged mechanical ventilation, need for intensive care, blood clot, pulmonary embolus, and possibly death. There is risk of damage to the bowel requiring future/emergent open surgery and the need for permanent colostomy, blood loss requiring blood transfusion, lymphocele requiring prolonged percutaneous drainage and/or  a special diet, blindness, prolonged rehabilitation (months to years), chronic/debiliating pain (which could be life long), need for readmission to the hospital, incisional hernia, incisional dehiscence, loss of sensation, loss of muscle strength, need for nasogastric decompression, parenteral nutrition, need for a wound vac or dressing changes for a nonhealing wound.   Patient was appraised of potential pelvic nerve(s) injury during the procedure from either positioning or from the surgery itself. Estimated to be < 1% in my experience, nerve injuries can require intense physical therapy, medical therapy and even require surgery.  In addition, post-operatively, there is risk of impotence (refractory to medications requiring a penile implant to achieve an erection) even if a nerve sparing procedure is performed, need for prolonged foley catheter, need for ureteral re-implant (needing a major surgery), complete incontinence that would require an artifical urinary sphincter to be placed, sexual dysfunction, penile shortening, and the need for adjuvant or salvage radiation +/- hormonal therapy. There is also a risk that the cancer may return and despite aggressive treatment, may be incurable leading to death. The treatments for advanced prostate cancer are both costly and severely impact a patient's quality of life.       Renee Rival, MD

## 2015-12-10 NOTE — Nurses Notes (Signed)
Pt arrived to clinic accompanied by wife, here to discuss radiation options. Reviewed medications and obtained VS, he denied pain. NCCN  And Functional Health completed. Seen and evaluated by Dr Lenore Manner. Pt has not made his mind up concerning tx.

## 2015-12-11 ENCOUNTER — Ambulatory Visit (INDEPENDENT_AMBULATORY_CARE_PROVIDER_SITE_OTHER): Payer: Self-pay | Admitting: Adult Health

## 2015-12-11 ENCOUNTER — Encounter (HOSPITAL_COMMUNITY): Payer: Self-pay | Admitting: Radiation Oncology

## 2015-12-11 DIAGNOSIS — C61 Malignant neoplasm of prostate: Secondary | ICD-10-CM

## 2015-12-11 HISTORY — DX: Malignant neoplasm of prostate: C61

## 2015-12-11 NOTE — Telephone Encounter (Signed)
Patient has elected radiation therapy. Follow with Dr Lenore Manner as directed.    Edythe Clarity, APRN,AGPCNP-BC  12/11/2015, 15:31

## 2015-12-11 NOTE — Telephone Encounter (Signed)
-----   Message from Judd Gaudier sent at 12/11/2015  2:46 PM EDT -----  "L" patient was seen on 12/10/15 in consult for newly diagnosed intermediate risk prostate cancer (cT1cNxMx, GS 7, PSA 5.5).  Patient called today to say that he has decided to have RADIATION TREATMENTS.  Thanks, BS    From 12/10/15 Note:  >>>>>>>>>>>>>>>>>>>>>>>>>>>>>>>>>>>>>>>  At this current time, the patient is leaning towards radiation. He does not want any official treatment plans made, he states he will call us when he has made a decision.   <<<<<<<<<<<<<<<<<<<<<<<<<<<<<<<<<<<<<<<

## 2015-12-12 ENCOUNTER — Other Ambulatory Visit (HOSPITAL_COMMUNITY): Payer: Self-pay | Admitting: Radiation Oncology

## 2015-12-12 DIAGNOSIS — C61 Malignant neoplasm of prostate: Secondary | ICD-10-CM

## 2015-12-12 MED ORDER — BICALUTAMIDE 50 MG TABLET
50.0000 mg | ORAL_TABLET | Freq: Every day | ORAL | 5 refills | Status: DC
Start: 2015-12-12 — End: 2016-05-26

## 2015-12-20 ENCOUNTER — Encounter (HOSPITAL_BASED_OUTPATIENT_CLINIC_OR_DEPARTMENT_OTHER): Payer: Self-pay

## 2015-12-20 NOTE — Progress Notes (Signed)
PER JACKIE AT [facility], PT RECEIVED SLEEP SUPPLIES 8.14.17 /SPASCHKE 11.3.17

## 2015-12-23 ENCOUNTER — Other Ambulatory Visit (HOSPITAL_COMMUNITY): Payer: Self-pay | Admitting: Radiation Oncology

## 2015-12-23 ENCOUNTER — Ambulatory Visit (HOSPITAL_COMMUNITY)
Admission: RE | Admit: 2015-12-23 | Discharge: 2015-12-23 | Disposition: A | Discharge status: Home / Self Care | Payer: No Typology Code available for payment source | Source: Ambulatory Visit | Attending: Radiation Oncology | Admitting: Radiation Oncology

## 2015-12-23 ENCOUNTER — Ambulatory Visit
Admission: RE | Admit: 2015-12-23 | Discharge: 2015-12-23 | Disposition: A | Discharge status: Home / Self Care | Payer: No Typology Code available for payment source | Source: Ambulatory Visit | Attending: Radiation Oncology | Admitting: Radiation Oncology

## 2015-12-23 ENCOUNTER — Ambulatory Visit (HOSPITAL_BASED_OUTPATIENT_CLINIC_OR_DEPARTMENT_OTHER)
Admission: RE | Admit: 2015-12-23 | Discharge: 2015-12-23 | Disposition: A | Discharge status: Home / Self Care | Payer: No Typology Code available for payment source | Source: Ambulatory Visit | Attending: Radiation Oncology | Admitting: Radiation Oncology

## 2015-12-23 DIAGNOSIS — C61 Malignant neoplasm of prostate: Secondary | ICD-10-CM

## 2015-12-23 LAB — POC ISTAT CREATININE (RESULT): CREATININE, POC: 0.9 mg/dL (ref 0.62–1.27)

## 2015-12-23 MED ORDER — DIATRIZOATE MEGLUMINE-DIATRIZOATE SODIUM 66 %-10 % ORAL SOLUTION
8.00 mL | ORAL | Status: AC
Start: 2015-12-23 — End: 2015-12-23
  Administered 2015-12-23: 8 mL via ORAL

## 2015-12-23 MED ORDER — IOPAMIDOL 300 MG IODINE/ML (61 %) INTRAVENOUS SOLUTION
100.00 mL | INTRAVENOUS | Status: AC
Start: 2015-12-23 — End: 2015-12-23
  Administered 2015-12-23: 100 mL via INTRAVENOUS

## 2015-12-23 MED ADMIN — diatrizoate meglumine-diatrizoate sodium 66 %-10 % oral solution: ORAL | @ 09:00:00

## 2015-12-25 ENCOUNTER — Telehealth (HOSPITAL_COMMUNITY): Payer: Self-pay

## 2015-12-25 ENCOUNTER — Ambulatory Visit: Payer: No Typology Code available for payment source | Attending: Radiation Oncology

## 2015-12-25 DIAGNOSIS — C61 Malignant neoplasm of prostate: Secondary | ICD-10-CM | POA: Insufficient documentation

## 2015-12-25 LAB — CBC WITH DIFF
BASOPHIL #: 0.07 x10ˆ3/uL (ref 0.00–0.20)
BASOPHIL %: 1 %
EOSINOPHIL #: 0.32 x10ˆ3/uL (ref 0.00–0.50)
EOSINOPHIL %: 4 %
HCT: 45.1 % (ref 36.7–47.0)
HGB: 15.2 g/dL (ref 12.5–16.3)
LYMPHOCYTE #: 2.87 x10ˆ3/uL (ref 1.00–4.80)
LYMPHOCYTE %: 32 %
MCH: 28.7 pg (ref 27.4–33.0)
MCHC: 33.8 g/dL (ref 32.5–35.8)
MCV: 84.9 fL (ref 78.0–100.0)
MONOCYTE #: 0.49 x10ˆ3/uL (ref 0.30–1.00)
MONOCYTE %: 6 %
MPV: 7.5 fL (ref 7.5–11.5)
NEUTROPHIL #: 5.13 x10ˆ3/uL (ref 1.50–7.70)
NEUTROPHIL %: 58 %
PLATELETS: 275 x10ˆ3/uL (ref 140–450)
RBC: 5.31 x10ˆ6/uL (ref 4.06–5.63)
RDW: 13.8 % (ref 12.0–15.0)
WBC: 8.9 x10ˆ3/uL (ref 3.5–11.0)

## 2015-12-25 LAB — BASIC METABOLIC PANEL
ANION GAP: 7 mmol/L (ref 4–13)
BUN/CREA RATIO: 14 (ref 6–22)
BUN: 13 mg/dL (ref 8–25)
CALCIUM: 9.4 mg/dL (ref 8.5–10.2)
CHLORIDE: 102 mmol/L (ref 96–111)
CO2 TOTAL: 28 mmol/L (ref 22–32)
CREATININE: 0.91 mg/dL (ref 0.62–1.27)
ESTIMATED GFR: >59 mL/min/1.73mˆ2 (ref >59)
GLUCOSE: 122 mg/dL (ref 65–139)
POTASSIUM: 4.2 mmol/L (ref 3.5–5.1)
SODIUM: 137 mmol/L (ref 136–145)

## 2015-12-25 LAB — PSA, DIAGNOSTIC: PSA: 4.8 ng/mL — ABNORMAL HIGH (ref <=4.0)

## 2015-12-25 NOTE — Telephone Encounter (Signed)
Spoke with pt about his labs, he stated that he will get them today since he is off, he stated understanding about the Lupron and starting Casodex three days prior to the injection.

## 2015-12-26 ENCOUNTER — Other Ambulatory Visit (HOSPITAL_COMMUNITY): Payer: Self-pay | Admitting: Radiation Oncology

## 2015-12-26 DIAGNOSIS — C61 Malignant neoplasm of prostate: Secondary | ICD-10-CM

## 2015-12-30 ENCOUNTER — Inpatient Hospital Stay
Admission: RE | Admit: 2015-12-30 | Discharge: 2015-12-30 | Disposition: A | Discharge status: Still In Hospital | Payer: No Typology Code available for payment source | Source: Ambulatory Visit | Attending: Radiation Oncology | Admitting: Radiation Oncology

## 2015-12-30 ENCOUNTER — Encounter (HOSPITAL_BASED_OUTPATIENT_CLINIC_OR_DEPARTMENT_OTHER): Payer: Self-pay

## 2015-12-30 DIAGNOSIS — C61 Malignant neoplasm of prostate: Secondary | ICD-10-CM

## 2015-12-30 MED ORDER — LEUPROLIDE ACETATE (6 MONTH) 45 MG INTRAMUSCULAR SYRINGE KIT
45.0000 mg | INJECTION | Freq: Once | INTRAMUSCULAR | Status: AC
Start: 2015-12-30 — End: 2015-12-30
  Administered 2015-12-30: 45 mg via INTRAMUSCULAR
  Filled 2015-12-30: qty 45

## 2015-12-30 NOTE — Nurses Notes (Addendum)
Patient arrived to infusion center for injection, orders released.  Raliegh Ip Swager RN  [phone] Lupron given per mar to Left Buttock. Gauze and band-aid applied and pt ambulatory upon d/c. C. Gabrian Hoque RN

## 2015-12-31 ENCOUNTER — Telehealth (HOSPITAL_COMMUNITY): Payer: Self-pay

## 2015-12-31 NOTE — Telephone Encounter (Signed)
Spoke with Henry [name] about taking his Casodex daily and about his CT and Ultrasound appts. He said he had spoken with radiology.

## 2016-01-06 DIAGNOSIS — Z79891 Long term (current) use of opiate analgesic: Secondary | ICD-10-CM | POA: Diagnosis not present

## 2016-01-07 ENCOUNTER — Other Ambulatory Visit (HOSPITAL_COMMUNITY): Payer: Self-pay

## 2016-01-07 ENCOUNTER — Other Ambulatory Visit (INDEPENDENT_AMBULATORY_CARE_PROVIDER_SITE_OTHER): Payer: Self-pay

## 2016-01-15 ENCOUNTER — Ambulatory Visit
Admission: RE | Admit: 2016-01-15 | Discharge: 2016-01-15 | Disposition: A | Discharge status: Home / Self Care | Payer: No Typology Code available for payment source | Source: Ambulatory Visit | Attending: Radiation Oncology | Admitting: Radiation Oncology

## 2016-01-15 ENCOUNTER — Ambulatory Visit (HOSPITAL_BASED_OUTPATIENT_CLINIC_OR_DEPARTMENT_OTHER)
Admission: RE | Admit: 2016-01-15 | Discharge: 2016-01-15 | Disposition: A | Discharge status: Home / Self Care | Payer: No Typology Code available for payment source | Source: Ambulatory Visit | Attending: Radiation Oncology | Admitting: Radiation Oncology

## 2016-01-15 DIAGNOSIS — C61 Malignant neoplasm of prostate: Secondary | ICD-10-CM

## 2016-01-16 ENCOUNTER — Other Ambulatory Visit (HOSPITAL_COMMUNITY): Payer: Self-pay | Admitting: Radiation Oncology

## 2016-01-16 ENCOUNTER — Ambulatory Visit
Admission: RE | Admit: 2016-01-16 | Discharge: 2016-01-16 | Disposition: A | Discharge status: Still In Hospital | Payer: No Typology Code available for payment source | Source: Ambulatory Visit | Attending: Radiation Oncology | Admitting: Radiation Oncology

## 2016-01-16 DIAGNOSIS — C61 Malignant neoplasm of prostate: Secondary | ICD-10-CM | POA: Insufficient documentation

## 2016-01-16 DIAGNOSIS — Z510 Encounter for antineoplastic radiation therapy: Secondary | ICD-10-CM | POA: Insufficient documentation

## 2016-01-17 ENCOUNTER — Other Ambulatory Visit (HOSPITAL_COMMUNITY): Payer: Self-pay | Admitting: Radiation Oncology

## 2016-01-17 ENCOUNTER — Ambulatory Visit
Admission: RE | Admit: 2016-01-17 | Discharge: 2016-01-17 | Disposition: A | Discharge status: Home / Self Care | Payer: No Typology Code available for payment source | Source: Ambulatory Visit | Attending: Radiation Oncology | Admitting: Radiation Oncology

## 2016-01-17 ENCOUNTER — Encounter (HOSPITAL_COMMUNITY): Payer: Self-pay | Admitting: Radiation Oncology

## 2016-01-17 DIAGNOSIS — I10 Essential (primary) hypertension: Secondary | ICD-10-CM

## 2016-01-17 DIAGNOSIS — C61 Malignant neoplasm of prostate: Secondary | ICD-10-CM | POA: Insufficient documentation

## 2016-01-17 LAB — AST (SGOT): AST (SGOT): 31 U/L (ref 8–48)

## 2016-01-17 NOTE — Progress Notes (Signed)
Consent Documentation  Consent statements/Questions YES NO NA   1.  Study Name    RTOG 0924 - - -   2.  Date      01-17-2016 - - -   3. Preferred language of subject/LAR     English - - -   4.  Protocol and consent form/s were thoroughly discussed by the treating physician and  Study coordinator    Consent discussion included the following people  Hulan Fess, MD and Lorn Junes, RN   X     5.  Discussion included purpose of study, risks, benefits, treatment alternatives, medications, study procedures, study duration and voluntary participation?   X     6.  Authorization for use and disclosure of personally identified health information (HIPPA) was discussed? X     7. Subject/LAR was given ample time to read the consent/s in private, to ask questions and to have questions answered. X     8.  Did the patient take the consent form home?  X    9.   Did the subject/LAR have any specific questions?    X    10.  Questions were answered to patient satisfaction?   X   11.   Costs associated with the study were discussed with the subject/LAR? X     12.   The subject/LAR was able to verbalize an understanding of the main purpose of the study and the treatment plan? X     13.   The subject/LAR was alert, oriented and freely consented to study participation? X     14.   All boxes or questions on the consent form/s were answered? X     15.   Each page of the consent form/s were initialed and dated with the correct date by subject/LAR?  X     16.   The consent form/s were signed and dated by the subject/LAR and treating physician? X     17.   Consent/s was signed before any study specific procedures were performed? X     18.   Subject/LAR was given a signed copy of the consent form/s and a copy was placed for scanning into EPIC and On[employer]? X     19.   Subject/LAR was provided with study staff contact information? X     20.  Additional comments - - -   Signature  Lorn Junes, RN - - -

## 2016-01-19 ENCOUNTER — Other Ambulatory Visit (HOSPITAL_BASED_OUTPATIENT_CLINIC_OR_DEPARTMENT_OTHER): Payer: Self-pay | Admitting: Family Medicine

## 2016-01-20 ENCOUNTER — Encounter (HOSPITAL_COMMUNITY): Payer: Self-pay | Admitting: Radiation Oncology

## 2016-01-20 NOTE — Progress Notes (Signed)
RTOG 2548 - Pt signed consent for study 01-17-2016.  Pt does not have a history of any cancers in the past and has been newly diagnosed with prostate cancer.  Has not had any intervention or treatments for this cancer.  Lupron administred 12-30-2015 and Casodex was started 12-27-2015.  He confirms that he has taken this daily.  Pt will be given a calendar to document Casodex use once randomized to trial.     Pt has no history of unstable angina or congestive heart failure and has no history of a myocardial infarction.    Pt does not have an acute bacterial or fungal infection requiring the use of IV antibiotics    Pt does not have a history of COPD and has not had a respiratory illness requiring hospitalization    Pt does not have a history of hepatic isufficiency, severe liver dysfunction or coagulation defects    Pt does not have a history of AIDS or contact with any persons who are HIV+    Pt agrees to use 2 forms of birth control (condoms, foam) during sexual activity during the conduct of the trial .    Lorn Junes, RN

## 2016-01-21 ENCOUNTER — Encounter (HOSPITAL_COMMUNITY): Payer: Self-pay | Admitting: Radiation Oncology

## 2016-01-21 NOTE — Progress Notes (Signed)
RTOG 5694 - Clarification note - Per Dr. Lenore Manner the lung nodules noted on CT of Chest imaging on 01-15-2016 appear benign  and unrelated to the prostate cancer.  The nodules will continue to be monitored with repeat CT scanning in 6 months as recommended.  Lorn Junes, RN

## 2016-01-27 ENCOUNTER — Encounter (HOSPITAL_COMMUNITY): Payer: Self-pay

## 2016-01-27 ENCOUNTER — Ambulatory Visit
Admission: RE | Admit: 2016-01-27 | Discharge: 2016-01-27 | Disposition: A | Discharge status: Home / Self Care | Payer: No Typology Code available for payment source | Source: Ambulatory Visit

## 2016-01-29 ENCOUNTER — Other Ambulatory Visit (HOSPITAL_COMMUNITY): Payer: Self-pay

## 2016-01-29 MED ORDER — CEFAZOLIN 3 GRAM/20 ML IN STERILE WATER INTRAVENOUS SYRINGE
3.0000 g | INJECTION | Freq: Once | INTRAVENOUS | Status: DC
Start: 2016-01-29 — End: 2016-01-30
  Filled 2016-01-29: qty 20

## 2016-01-30 ENCOUNTER — Inpatient Hospital Stay
Admission: RE | Admit: 2016-01-30 | Discharge: 2016-01-30 | Disposition: A | Discharge status: Home / Self Care | Payer: No Typology Code available for payment source | Source: Ambulatory Visit | Attending: Urology | Admitting: Urology

## 2016-01-30 ENCOUNTER — Encounter (HOSPITAL_COMMUNITY)
Admission: RE | Disposition: A | Discharge status: Home / Self Care | Payer: Self-pay | Source: Ambulatory Visit | Attending: Urology

## 2016-01-30 ENCOUNTER — Other Ambulatory Visit (HOSPITAL_COMMUNITY): Payer: Self-pay | Admitting: Radiation Oncology

## 2016-01-30 ENCOUNTER — Ambulatory Visit (HOSPITAL_BASED_OUTPATIENT_CLINIC_OR_DEPARTMENT_OTHER): Payer: No Typology Code available for payment source | Admitting: ANESTHESIOLOGY

## 2016-01-30 ENCOUNTER — Encounter (HOSPITAL_COMMUNITY): Payer: Self-pay

## 2016-01-30 ENCOUNTER — Ambulatory Visit (HOSPITAL_BASED_OUTPATIENT_CLINIC_OR_DEPARTMENT_OTHER): Payer: No Typology Code available for payment source | Admitting: Urology

## 2016-01-30 ENCOUNTER — Ambulatory Visit (HOSPITAL_COMMUNITY): Payer: No Typology Code available for payment source | Admitting: ANESTHESIOLOGY

## 2016-01-30 DIAGNOSIS — Z6841 Body Mass Index (BMI) 40.0 and over, adult: Secondary | ICD-10-CM | POA: Insufficient documentation

## 2016-01-30 DIAGNOSIS — K589 Irritable bowel syndrome without diarrhea: Secondary | ICD-10-CM | POA: Insufficient documentation

## 2016-01-30 DIAGNOSIS — E669 Obesity, unspecified: Secondary | ICD-10-CM | POA: Insufficient documentation

## 2016-01-30 DIAGNOSIS — I10 Essential (primary) hypertension: Secondary | ICD-10-CM

## 2016-01-30 DIAGNOSIS — E785 Hyperlipidemia, unspecified: Secondary | ICD-10-CM

## 2016-01-30 DIAGNOSIS — Z9981 Dependence on supplemental oxygen: Secondary | ICD-10-CM | POA: Insufficient documentation

## 2016-01-30 DIAGNOSIS — R972 Elevated prostate specific antigen [PSA]: Secondary | ICD-10-CM

## 2016-01-30 DIAGNOSIS — M1990 Unspecified osteoarthritis, unspecified site: Secondary | ICD-10-CM | POA: Insufficient documentation

## 2016-01-30 DIAGNOSIS — C61 Malignant neoplasm of prostate: Secondary | ICD-10-CM

## 2016-01-30 DIAGNOSIS — G4730 Sleep apnea, unspecified: Secondary | ICD-10-CM | POA: Insufficient documentation

## 2016-01-30 DIAGNOSIS — K219 Gastro-esophageal reflux disease without esophagitis: Secondary | ICD-10-CM | POA: Insufficient documentation

## 2016-01-30 HISTORY — DX: Presence of spectacles and contact lenses: Z97.3

## 2016-01-30 SURGERY — INSERTION HYDROGEL SPACER
Anesthesia: Monitor Anesthesia Care | Wound class: Clean Contaminated Wounds-The respiratory, GI, Genital, or urinary

## 2016-01-30 MED ORDER — IOPAMIDOL 200 MG IODINE/ML (41 %) INJECTION SOLUTION
10.0000 mL | Freq: Once | INTRAMUSCULAR | Status: DC | PRN
Start: 2016-01-30 — End: 2016-01-30
  Administered 2016-01-30: 11:00:00 1 mL via INTRAVENOUS

## 2016-01-30 MED ORDER — HYDROCODONE 5 MG-ACETAMINOPHEN 325 MG TABLET
1.00 | ORAL_TABLET | Freq: Four times a day (QID) | ORAL | 0 refills | Status: DC | PRN
Start: 2016-01-30 — End: 2016-06-30

## 2016-01-30 MED ORDER — SODIUM CHLORIDE 0.9 % (FLUSH) INJECTION SYRINGE
2.00 mL | INJECTION | Freq: Three times a day (TID) | INTRAMUSCULAR | Status: DC
Start: 2016-01-30 — End: 2016-01-30

## 2016-01-30 MED ORDER — FENTANYL (PF) 50 MCG/ML INJECTION SOLUTION
25.0000 ug | INTRAMUSCULAR | Status: DC | PRN
Start: 2016-01-30 — End: 2016-01-30

## 2016-01-30 MED ORDER — DEXMEDETOMIDINE 400 MCG/100 ML (4 MCG/ML) IN 0.9 % SODIUM CHLORIDE IV
INTRAVENOUS | Status: DC | PRN
Start: 2016-01-30 — End: 2016-01-30
  Administered 2016-01-30: 0.5 ug/kg/h via INTRAVENOUS
  Administered 2016-01-30: 3 ug/kg/h via INTRAVENOUS
  Administered 2016-01-30: 1 ug/kg/h via INTRAVENOUS

## 2016-01-30 MED ORDER — PROPOFOL 10 MG/ML INTRAVENOUS EMULSION
Freq: Once | INTRAVENOUS | Status: DC | PRN
Start: 2016-01-30 — End: 2016-01-30
  Administered 2016-01-30: 25 ug via INTRAVENOUS

## 2016-01-30 MED ORDER — LIDOCAINE 2 % MUCOSAL JELLY IN APPLICATOR
10.0000 mL | Freq: Once | Status: DC | PRN
Start: 2016-01-30 — End: 2016-01-30
  Administered 2016-01-30: 10 mL via URETHRAL

## 2016-01-30 MED ORDER — FENTANYL (PF) 50 MCG/ML INJECTION SOLUTION
12.5000 ug | INTRAMUSCULAR | Status: DC | PRN
Start: 2016-01-30 — End: 2016-01-30

## 2016-01-30 MED ORDER — LIDOCAINE 2 % MUCOSAL JELLY IN APPLICATOR
Status: AC
Start: 2016-01-30 — End: 2016-01-30
  Filled 2016-01-30: qty 10

## 2016-01-30 MED ORDER — ONDANSETRON HCL (PF) 4 MG/2 ML INJECTION SOLUTION
Freq: Once | INTRAMUSCULAR | Status: DC | PRN
Start: 2016-01-30 — End: 2016-01-30
  Administered 2016-01-30: 4 mg via INTRAVENOUS

## 2016-01-30 MED ORDER — SODIUM CHLORIDE 0.9 % IRRIGATION SOLUTION
1000.0000 mL | Status: DC | PRN
Start: 2016-01-30 — End: 2016-01-30
  Administered 2016-01-30: 1000 mL

## 2016-01-30 MED ORDER — DEXMEDETOMIDINE 200 MCG IN NS 50 ML INFUSION - FOR ANES
INTRAVENOUS | Status: DC | PRN
Start: 2016-01-30 — End: 2016-01-30
  Administered 2016-01-30: 1 ug/kg/h via INTRAVENOUS
  Administered 2016-01-30: 0 ug/kg/h via INTRAVENOUS
  Administered 2016-01-30: 1 ug/kg/h via INTRAVENOUS
  Administered 2016-01-30: 3 ug/kg/h via INTRAVENOUS

## 2016-01-30 MED ORDER — KETAMINE 10 MG/ML INJECTION SOLUTION
Freq: Once | INTRAMUSCULAR | Status: DC | PRN
Start: 2016-01-30 — End: 2016-01-30
  Administered 2016-01-30: 20 mg via INTRAVENOUS
  Administered 2016-01-30 (×3): 10 mg via INTRAVENOUS

## 2016-01-30 MED ORDER — SODIUM CHLORIDE 0.9 % INTRAVENOUS SOLUTION
6.2500 mg | Freq: Once | INTRAVENOUS | Status: DC | PRN
Start: 2016-01-30 — End: 2016-01-30

## 2016-01-30 MED ORDER — SODIUM CHLORIDE 0.9 % (FLUSH) INJECTION SYRINGE
2.0000 mL | INJECTION | Freq: Three times a day (TID) | INTRAMUSCULAR | Status: DC
Start: 2016-01-30 — End: 2016-01-30

## 2016-01-30 MED ORDER — LIDOCAINE (PF) 10 MG/ML (1 %) INJECTION SOLUTION
30.0000 mL | Freq: Once | INTRAMUSCULAR | Status: DC | PRN
Start: 2016-01-30 — End: 2016-01-30
  Administered 2016-01-30: 10 mL via INTRAMUSCULAR

## 2016-01-30 MED ORDER — LIDOCAINE (PF) 10 MG/ML (1 %) INJECTION SOLUTION
INTRAMUSCULAR | Status: AC
Start: 2016-01-30 — End: 2016-01-30
  Filled 2016-01-30: qty 30

## 2016-01-30 MED ORDER — KETAMINE 100 MG/ML INJECTION SOLUTION
Freq: Once | INTRAMUSCULAR | Status: DC | PRN
Start: 2016-01-30 — End: 2016-01-30

## 2016-01-30 MED ORDER — MIDAZOLAM 1 MG/ML INJECTION SOLUTION
Freq: Once | INTRAMUSCULAR | Status: DC | PRN
Start: 2016-01-30 — End: 2016-01-30
  Administered 2016-01-30: 2 mg via INTRAVENOUS

## 2016-01-30 MED ORDER — CIPROFLOXACIN 500 MG TABLET
500.00 mg | ORAL_TABLET | Freq: Two times a day (BID) | ORAL | 0 refills | Status: AC
Start: 2016-01-30 — End: 2016-02-02

## 2016-01-30 MED ORDER — SODIUM CHLORIDE 0.9 % (FLUSH) INJECTION SYRINGE
2.00 mL | INJECTION | INTRAMUSCULAR | Status: DC | PRN
Start: 2016-01-30 — End: 2016-01-30

## 2016-01-30 MED ORDER — PROPOFOL 10 MG/ML INTRAVENOUS EMULSION
INTRAVENOUS | Status: DC | PRN
Start: 2016-01-30 — End: 2016-01-30
  Administered 2016-01-30: 25 ug/kg/min via INTRAVENOUS
  Administered 2016-01-30: 50 ug/kg/min via INTRAVENOUS
  Administered 2016-01-30: 0 ug/kg/min via INTRAVENOUS
  Administered 2016-01-30: 25 ug/kg/min via INTRAVENOUS
  Administered 2016-01-30: 50 ug/kg/min via INTRAVENOUS
  Administered 2016-01-30: 25 ug/kg/min via INTRAVENOUS

## 2016-01-30 MED ORDER — DEXAMETHASONE SODIUM PHOSPHATE 10 MG/ML INJECTION SOLUTION
Freq: Once | INTRAMUSCULAR | Status: DC | PRN
Start: 2016-01-30 — End: 2016-01-30
  Administered 2016-01-30: 4 mg via INTRAVENOUS

## 2016-01-30 MED ORDER — SODIUM CHLORIDE 0.9 % (FLUSH) INJECTION SYRINGE
2.0000 mL | INJECTION | INTRAMUSCULAR | Status: DC | PRN
Start: 2016-01-30 — End: 2016-01-30

## 2016-01-30 MED ORDER — FENTANYL (PF) 50 MCG/ML INJECTION SOLUTION
Freq: Once | INTRAMUSCULAR | Status: DC | PRN
Start: 2016-01-30 — End: 2016-01-30
  Administered 2016-01-30 (×4): 25 ug via INTRAVENOUS

## 2016-01-30 MED ORDER — LACTATED RINGERS INTRAVENOUS SOLUTION
INTRAVENOUS | Status: DC
Start: 2016-01-30 — End: 2016-01-30

## 2016-01-30 MED ORDER — LACTATED RINGERS INTRAVENOUS SOLUTION
INTRAVENOUS | Status: DC
Start: 2016-01-30 — End: 2016-01-30
  Administered 2016-01-30: 0 via INTRAVENOUS

## 2016-01-30 MED ADMIN — dexMEDEtomidine 100 mcg/mL intravenous solution: INTRAVENOUS | @ 10:00:00

## 2016-01-30 MED ADMIN — sodium chloride 0.9 % (flush) injection syringe: INTRAVENOUS | @ 11:00:00

## 2016-01-30 MED ADMIN — nystatin 100,000 unit/gram topical powder: INTRAVENOUS | @ 10:00:00 | NDC 68308015215

## 2016-01-30 MED ADMIN — sodium chloride 0.9 % intravenous solution: INTRAVENOUS | @ 11:00:00 | NDC 00338004904

## 2016-01-30 SURGICAL SUPPLY — 61 items
BAG DRAIN NONST LF  VNYL ACT 8MM DISP GE/OEC SYS (UROLOGICAL SUPPLIES) IMPLANT
BAG UROLOGY DRAIN CMS954VS_CS/20 (UROLOGICAL SUPPLIES)
BANDAGE KERLIX 4.1YDX4.5IN 6 PLY ABS CRNKL WV LINT FREE COTTON LRG GAUZE STRL LF  DISP (WOUND CARE SUPPLY) IMPLANT
BANDAGE KERLIX 4.1YDX4.5IN 6 P_LY ABS SFT PCH LINT FR COT LRG (WOUND CARE/ENTEROSTOMAL SUPPLY)
CATH URETH BARDEX LUBRICATH 18FR STD FOLEY 2W DRAIN EYE MED OLV CDE RUB DDRGL MALE 5CC STRL LTX DISP (UROLOGICAL SUPPLIES) IMPLANT
CATH URETH BDX LBR 18FR STD FL_2W DRAIN EYE MED OLV CDE RUB (UROLOGICAL SUPPLIES)
CONRAY 43 50ML CS/25 3573813 (CONTRAST) IMPLANT
CONTRAST ISOVUE 250 10X200ML_131741 10/BX (CONTRAST) ×2 IMPLANT
CONV USE ITEM 313634 - COVER PROBE KOVER 96X6IN OR 4_FLG 2 SHEET 24 PRNT STRL LF (DRAPE/PACKS/SHEETS/OR TOWEL) ×2 IMPLANT
CONV USE ITEM 325441 - GEL US AQSN 100 TRNMS OVRWRAP COMPLETE AQ FOIL PCH LF  STRL 20GM CLR (GEL) IMPLANT
COUNTER 40 CNT MAG DVN BOXLOCKS STRL LF  BLK SHARP 1840 PLASTIC FOAM XL DISP (NEEDLES & SYRINGE SUPPLIES) ×1 IMPLANT
COUNTER 40 CNT MAG DVN BXLK ST_RL LF BLK SHARP 1840 PLASTIC (NEEDLES & SYRINGE SUPPLIES) ×1
COVER 53X24IN MAYOSTAND PRXM STRL DISP EQP SMS LF (DRAPE/PACKS/SHEETS/OR TOWEL) ×1 IMPLANT
CUP MED 2OZ PLASTIC GRAD STRL LF  DISP (CUP) ×1 IMPLANT
CUP MED 2OZ PLASTIC GRAD STRL_LF DISP (CUP) ×2
DEVICE SECURE STATLK FOLEY ANCH PAD STAB TRCT SIL CATH ADULT STRL LF  DISP (UROLOGICAL SUPPLIES) IMPLANT
DEVICE SECURE STATLK FOLEY ANC_H PAD STAB TRCT SIL CATH ADLT (UROLOGICAL SUPPLIES)
DISC USE 162466 - BAG DRAIN UROLOGY 2000ML LF_154003 20EA/CS (UROLOGICAL SUPPLIES) IMPLANT
DISC USE 162466 BAG DRAIN UROLOGY 2000ML LF_154003 20EA/CS (UROLOGICAL SUPPLIES)
DISCONTINUED USE ITEM 307130 - GOWN SURG XL XLNG AAMI L4 IMPR_V BRTHBL HKLP CLSR STRL LF (PROTECTIVE PRODUCTS/GARMENTS) ×1
DISCONTINUED USE ITEM 328361 - JELLY LUB EZ BCTRST H2O SOL NG_RS FLPTP TUBE STRL 2OZ LF (MISCELLANEOUS PT CARE ITEMS) ×2 IMPLANT
DISCONTINUED USE ITEM 342873 - GOWN SURG XL XLNG AAMI L4 IMPR_V BRTHBL HKLP CLSR STRL LF (PROTECTIVE PRODUCTS/GARMENTS) ×1 IMPLANT
DRAPE 44X44IN FLD WRM TEMP CNT_RL VSB DSPL PORT STND EQP ORS (DRAPE/PACKS/SHEETS/OR TOWEL) ×1
DRAPE CARM POLY STRAP MBL XRY 72X42IN LF  EQP (DRAPE/PACKS/SHEETS/OR TOWEL) ×1 IMPLANT
DRAPE CARM POLY STRAP MBL XRY_72X42IN LF EQP (DRAPE/PACKS/SHEETS/OR TOWEL) ×1
DRAPE FLUID WRMR TEMP CONTROL PORT STAND 44X44IN ORS LF  STRL DISP EQP POLYUR CLR (DRAPE/PACKS/SHEETS/OR TOWEL) ×1 IMPLANT
DRAPE MAYOSTAND CVR 53X24IN PR_XM LF STRL DISP EQP SMS (DRAPE/PACKS/SHEETS/OR TOWEL) ×1
DRAPE PCH DRAIN PORT 33.5X32X14IN UNDR BUTT PRXM LF  STRL DISP SURG SMS 46X33.5IN (CUSTOM TRAYS & PACK) ×1 IMPLANT
DRAPE PCH DRAIN PORT 33.5X32X1_4IN UNDR BUTT PRXM LF STRL (CUSTOM TRAYS & PACK) ×1
FIDUCIAL MARKER KIT (Other Miscellaneous) ×6 IMPLANT
GARMENT COMPRESS LRG CALF CENTAURA NYL VASOGRAD LTWT BRTHBL SEQ FIL BLU 24- IN (ORTHOPEDICS (NOT IMPLANTS)) ×1 IMPLANT
GARMENT COMPRESS LRG CALF CENT_AURA NYL VASOGRAD LTWT BRTHBL (ORTHOPEDICS (NOT IMPLANTS)) ×1
GAUZE SURG 4X4IN RFDETECT RF A_SSURE COTTON 16 PLY XRY DTBL (WOUND CARE/ENTEROSTOMAL SUPPLY) ×1
GAUZE SURG 4X4IN STD RFDETECT COTTON 16 PLY XRY ABS LF  STRL DISP (WOUND CARE SUPPLY) ×1 IMPLANT
GEL US AQSN 100 COMPLETE AQ TR_NMS FOIL PCH LF STRL 20GM (GEL)
GOWN SURG LRG AAMI L3 NONREINF_ORCE SET IN SLEEVE HKLP CLSR (DGOW) ×2
GOWN SURG LRG L3 NONREINFORCE HKLP CLSR SET IN SLEEVE STRL LF  DISP BLU SIRUS SMS 43IN (DGOW) ×2 IMPLANT
GOWN SURG XL L3 NONREINFORCE HKLP CLSR SET IN SLEEVE STRL LF  DISP BLU SIRUS SMS 47IN (DGOW) ×1 IMPLANT
GOWN SURG XL L3 NONREINFORCE H_KLP CLSR SET IN SLEEVE STRL LF (DGOW) ×1
GOWN SURG XL XLNG AAMI L4 IMPR_V BRTHBL HKLP CLSR STRL LF (PROTECTIVE PRODUCTS/GARMENTS) ×2
JELLY LUB EZ BCTRST H2O SOL NG_RS FLPTP TUBE STRL 2OZ LF (MISCELLANEOUS PT CARE ITEMS) ×4
KIT SETUP MINOR SURGICAL (KITS & TRAYS (DISPOSABLE)) ×2 IMPLANT
NEEDLE SPINAL BLK 3.5IN 22GA QUINCKE REG WL POLYPROP QUINCKE TIP STRL LF  DISP (ANETHESIA SUPPLIES) IMPLANT
NEEDLE SPINAL BLK 3.5IN 22GA Q_UINCKE REG WL POLYPROP REG BVL (ANETHESIA SUPPLIES)
NEEDLE SPINAL PNK 3.5IN 18GA QUINCKE REG WL POLYPROP QUINCKE TIP STRL LF  DISP (ANETHESIA SUPPLIES) IMPLANT
NEEDLE SPINAL PNK 3.5IN 18GA Q_UINCKE STD LGTH BVL FIT STY (ANETHESIA SUPPLIES)
PACK SURG SIRUS LITH II UNDERBUTTOCK DRP REINF TBL CVR TAPE STRL 90X50IN 47X35IN LF (DRAPE/PACKS/SHEETS/OR TOWEL) ×1 IMPLANT
PACK SURG SIRUS LITH II UNDERB_UTTOCK DRP REINF TBL CVR TAPE (DRAPE/PACKS/SHEETS/OR TOWEL) ×1
PEN SURG MRKNG WRITESITE + RLR LBL SET 3X DARKER FORMULATE GNTN VIOL INK STRL LF  CHLRPRP (MISCELLANEOUS PT CARE ITEMS) ×1 IMPLANT
PEN SURG MRKNG WRITESITE + SKN_RLR LBL SET 3X DARKER (MISCELLANEOUS PT CARE ITEMS) ×1
SUTURE SILK 0 CT1 PERMAHAND 30IN BLK BRD NONAB (SUTURE/WOUND CLOSURE) ×21 IMPLANT
SUTURE SILK 0 CT1 PERMAHAND 30_IN BLK BRD NONAB (SUTURE/WOUND CLOSURE) ×21
SYRINGE 50ML LF  STRL GRAD N-PYRG DEHP-FR PVC FREE CATH TIP DISP CLR (NEEDLES & SYRINGE SUPPLIES) ×1 IMPLANT
SYRINGE 50ML LF STRL GRAD N-P_YRG DEHP-FR PVC FREE CATH TIP (NEEDLES & SYRINGE SUPPLIES) ×1
SYRINGE LL 10ML LF  STRL GRAD N-PYRG DEHP-FR PVC FREE MED DISP (NEEDLES & SYRINGE SUPPLIES) ×1 IMPLANT
SYRINGE LL 10ML LF STRL MED D_ISP (NEEDLES & SYRINGE SUPPLIES) ×1
SpaceOAR System ×1 IMPLANT
TRAY CATH 16FR BARDEX LUBRICATH STATLK SAF-FLOW FOLEY URMTR ADV NATURAL RUB DDRGL 350ML STRL LTX (TRAY) IMPLANT
TRAY CATH 16FR BARDEX LUBRICAT_H STATLK SAF-FLOW FOLEY URMTR (TRAY)
TRAY SKIN SCRUB 8IN VNYL COTTON 6 WNG 6 SPONGE STICK 2 TIP APPL DRY STRL LF (KITS & TRAYS (DISPOSABLE)) ×1 IMPLANT
TRAY SURG PREP SCR CR ESTM (KITS & TRAYS (DISPOSABLE)) ×1

## 2016-01-30 NOTE — OR PostOp (Signed)
Patient discharged from PACU and per dr request is being transported by wheelchair to radiation therapy to speak with Dr.  Deborha Payment will send pt home from there.  Condition stable.  Patient has no complaints at this time.  Wife at side.

## 2016-01-30 NOTE — Care Plan (Signed)
Problem: Thought Process Alteration (Adult)  Goal: Identify Related Risk Factors and Signs and Symptoms  Related risk factors and signs and symptoms are identified upon initiation of Human Response Clinical Practice Guideline (CPG).   Outcome: Ongoing (see interventions/notes)

## 2016-01-30 NOTE — H&P (Signed)
[facility]  PREOPERATIVE HISTORY AND PHYSICAL EXAMINATION    Name: Henry [name]   ZMCEY'E Date: 01/30/16    CC: History of   1. Intermediate risk prostate cancer Gleason 3+4=7     History of present illness:  The patient is 60 y.o. male who presents for insertion of hydrogel spacer prior to receiving EBRT. He has a history of Gleason 3+4=7 prostate cancer, PSA 5.5, intermediate risk for which he underwent workup by Dr. Josephine Cables and Lenore Manner. He ultimately elected for EBRT + 6 months ADT and presents today for placement of SpaceOAR. No new issues.    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    PHYSICAL EXAMINATION:   Well-developed, well-nourished male in no apparent distress.  Head and neck are normocephalic atraumatic  Respiratory examination reveals good inspiratory effort  bilaterally  Abdomen is non-distended.  Neurological exam is grossly normal  Skin is normal  Musculoskeletal exam reveals movement of all extremities    ASSESSMENT: Pre Op for Insertion Hydrogel Spacer.     PLAN:    Consent obtained    PAU orders completed   Patient appraised of risks and benefits of the procedure.     All questions of patient and family answered.     Merian Capron, MD 01/30/2016, 08:16  Department of Urology - PGY 3  [place]  [phone]      I saw and examined the patient.  I reviewed the resident's note.  I agree with the findings and plan of care as documented in the resident's note.  Any exceptions/additions are edited/noted.    Renee Rival, MD

## 2016-01-30 NOTE — Care Plan (Signed)
Problem: Anxiety (Adult)  Goal: Identify Related Risk Factors and Signs and Symptoms  Related risk factors and signs and symptoms are identified upon initiation of Human Response Clinical Practice Guideline (CPG).   Outcome: Ongoing (see interventions/notes)

## 2016-01-30 NOTE — Anesthesia Postprocedure Evaluation (Signed)
Anesthesia Post Op Evaluation    Patient: Henry [name] Car  Procedure(s) Performed:Procedure(s) with comments:  INSERTION HYDROGEL SPACER - fiducial placement  Last Vitals:Temperature: 36.4 C (97.5 F) (01/30/16 1125)  Heart Rate: 64 (01/30/16 1125)  BP (Non-Invasive): 111/63 (01/30/16 1125)  Respiratory Rate: 18 (01/30/16 1125)  SpO2-1: 98 % (01/30/16 1125)  Pain Score (Numeric, Faces): 0 (01/30/16 1125)  Patient is sufficiently recovered from the effects of anesthesia to participate in the evaluation and has returned to their pre-procedure level.  Patient location during evaluation: PACU   Post-procedure handoff checklist completed    Patient participation: complete - patient participated  Level of consciousness: awake and alert and responsive to verbal stimuli  Pain management: adequate  Airway patency: patent  Anesthetic complications: no  Cardiovascular status: acceptable  Respiratory status: acceptable  Hydration status: acceptable  Patient post-procedure temperature: Pt Normothermic   PONV Status: Absent

## 2016-01-30 NOTE — Discharge Instructions (Signed)
SURGICAL DISCHARGE INSTRUCTIONS     Dr. Josephine Cables, Benjamine Sprague, MD  performed your INSERTION HYDROGEL SPACER today at the [place]:  Monday through Friday from 6 a.m. - 7 p.m.: [phone]  Between 7 p.m. - 6 a.m., weekends and holidays:  Call [facility] at [phone] or [phone].    PLEASE SEE WRITTEN HANDOUTS AS DISCUSSED BY YOUR NURSE:      SIGNS AND SYMPTOMS OF A WOUND / INCISION INFECTION   Be sure to watch for the following:   Increase in redness or red streaks near or around the wound or incision.   Increase in pain that is intense or severe and cannot be relieved by the pain medication that your doctor has given you.   Increase in swelling that cannot be relieved by elevation of a body part, or by applying ice, if permitted.   Increase in drainage, or if yellow / green in color and smells bad. This could be on a dressing or a cast.   Increase in fever for longer than 24 hours, or an increase that is higher than 101 degrees Fahrenheit (normal body temperature is 98 degrees Fahrenheit). The incision may feel warm to the touch.    **CALL YOUR DOCTOR IF ONE OR MORE OF THESE SIGNS / SYMPTOMS SHOULD OCCUR.    ANESTHESIA INFORMATION   ANESTHESIA -- ADULT PATIENTS:  You have received intravenous sedation / general anesthesia, and you may feel drowsy and light-headed for several hours. You may even experience some forgetfulness of the procedure. DO NOT DRIVE A MOTOR VEHICLE or perform any activity requiring complete alertness or coordination until you feel fully awake in about 24-48 hours. Do not drink alcoholic beverages for at least 24 hours. Do not stay alone, you must have a responsible adult available to be with you. You may also experience a dry mouth or nausea for 24 hours. This is a normal side effect and will disappear as the effects of the medication wear off.    REMEMBER   If you experience any difficulty breathing, chest pain, bleeding that you  feel is excessive, persistent nausea or vomiting or for any other concerns:  Call your physician Dr. Josephine Cables at [phone] or [phone]. You may also ask to have the  doctor on call paged. They are available to you 24 hours a day.    SPECIAL INSTRUCTIONS / COMMENTS       FOLLOW-UP APPOINTMENTS   Please call patient services at [phone] or [phone] to schedule a date / time of return. They are open Monday - Friday from 7:30 am - 5:00 pm.  SURGICAL DISCHARGE INSTRUCTIONS     Dr. Renee Rival, MD  performed your INSERTION HYDROGEL SPACER today at the [place]:  Monday through Friday from 6 a.m. - 7 p.m.: [phone]  Between 7 p.m. - 6 a.m., weekends and holidays:  Call [facility] at [phone] or [phone].    PLEASE SEE WRITTEN HANDOUTS AS DISCUSSED BY YOUR NURSE:      SIGNS AND SYMPTOMS OF A WOUND / INCISION INFECTION   Be sure to watch for the following:   Increase in redness or red streaks near or around the wound or incision.   Increase in pain that is intense or severe and cannot be relieved by the pain medication that your doctor has given you.   Increase  in swelling that cannot be relieved by elevation of a body part, or by applying ice, if permitted.   Increase in drainage, or if yellow / green in color and smells bad. This could be on a dressing or a cast.   Increase in fever for longer than 24 hours, or an increase that is higher than 101 degrees Fahrenheit (normal body temperature is 98 degrees Fahrenheit). The incision may feel warm to the touch.    **CALL YOUR DOCTOR IF ONE OR MORE OF THESE SIGNS / SYMPTOMS SHOULD OCCUR.    ANESTHESIA INFORMATION   ANESTHESIA -- ADULT PATIENTS:  You have received intravenous sedation / general anesthesia, and you may feel drowsy and light-headed for several hours. You may even experience some forgetfulness of the procedure. DO NOT DRIVE A MOTOR VEHICLE or perform any activity requiring  complete alertness or coordination until you feel fully awake in about 24-48 hours. Do not drink alcoholic beverages for at least 24 hours. Do not stay alone, you must have a responsible adult available to be with you. You may also experience a dry mouth or nausea for 24 hours. This is a normal side effect and will disappear as the effects of the medication wear off.    REMEMBER   If you experience any difficulty breathing, chest pain, bleeding that you feel is excessive, persistent nausea or vomiting or for any other concerns:  Call your physician Dr. Josephine Cables at [phone] or [phone]. You may also ask to have the  doctor on call paged. They are available to you 24 hours a day.    SPECIAL INSTRUCTIONS / COMMENTS       FOLLOW-UP APPOINTMENTS   Please call patient services at [phone] or [phone] to schedule a date / time of return. They are open Monday - Friday from 7:30 am - 5:00 pm.

## 2016-01-30 NOTE — Care Plan (Signed)

## 2016-01-30 NOTE — OR Surgeon (Signed)
[place] [facility] SURGERY   OPERATION SUMMARY    Pryce Folts [facility]  04/04/1955  [id number]  01/30/16    PREOPERATIVE DIAGNOSES:   1.Intermediate risk prostate cancer, Gleason 3+4, 4 cores, LMA, LMM, LLB, LMB location, 10% of tissue  2. Prostate specific antigen (PSA) - 5.5  3. American Urological Association (AUA) symptom score - 8   4. Sexual Health Inventory for Men (SHIM) - 25    POSTOPERATIVE DIAGNOSES:       NAME OF PROCEDURES:  1. Rectal Spacer, transperineal, Space OAR - CPT [id number]   3. Gold Fiducials x3  4. Intraoperative ultrasound.  5. Placement of gold fiducial markers x3.     OPERATIVE FINDINGS:  1. On transrectal ultrasound of the prostate, the prostate measured 56.8 mL.  2.  Gold fiducials placed at apex, R and L base  3.  Space OAR placed with good separation of prostate off of rectum      SURGEONS: Jarvis Newcomer, MD (attending - urologic oncologist) and Rexanne Mano. Lenore Manner, MD (radiation oncologist). No qualified resident to assist.     ANESTHESIA:MAC    ESTIMATED BLOOD LOSS: Minimal.    BLOOD GIVEN: None.    FLUIDS GIVEN: See anesthesia report.    COMPLICATIONS: None.    CHARACTERISTIC EVENT: None.    TUBES: None.    DRAINS: None.    SPECIMENS: None.    IMPLANTS: Gold Fiducials x 3, Space OAR     INDICATIONS FOR PROCEDURE: This is a very pleasant, 60 year-old male that has intermediate risk prostate cancer. I explained to him the risks and benefits of the operation to him and his wife as well as Dr. Lenore Manner. There is a risk of heart attack, prolonged mechanical ventilation, need for intensive care, blood clots, pulmonary embolus, spinal hematoma causing paralysis and possibly death. There is risk of damage to the bowel requiring future emergent open surgery and the need for permanent colostomy, blood loss requiring blood transfusion, prolonged rehabilitation, chronic  debilitating pain and need for readmission to the hospital, loss of sensation, loss of muscle strength, need for Wound VAC or dressing changes for a nonhealing wound. The patient was also appraised of the potential pelvic nerve injury during the procedure from positioning or from the surgery itself estimated to be less than 1%. These can include nerve injuries requiring intensive physical therapy, medical therapy and even surgery. he patient wishes to proceed with today's surgery.MAC anesthesia was then induced by anesthesia.    DESCRIPTION OF PROCEDURE: The patient was taken back to the operating room. MAC anesthesia was administered. A surgical timeout was then preformed identifying the correct patient, MRN, DOB, anti-biotics, surgical site and procedure. He was then placed in the dorsal lithotomy position. We then prepped the perineal area including the anus and we blotted dry the prep. After the prep, an 18-French Coude catheter was placed without difficulty. We then placed the legs up in extended lithotomy position where they were well prepped and draped and secured. The patient was given intravenous antibiotics within 1 hour prior to surgical start. The scrotum was secured out of the field. We then inserted the transrectal ultrasound and measured the prostate. Please see the above operative findings. We identified the apex on ultrasound and placed 1 gold marker seed at the apex. Two additional fiducials were placed at the right and left base. These will help with contouring later with the radiation oncologist. Using a spinal needle angled into the perirectal space  we placed 10 cc of the SpaceOAR to create more space between the prostate and rectum. Using sagittal and axial views to confirm good separation. The patient tolerated the procedure well. He will f/u with Dr. Lenore Manner for radiotherapy.    Jarvis Newcomer, MD  Assistant Professor, [facility] Department of Surgery; Section of  Urology

## 2016-01-30 NOTE — Anesthesia Transfer of Care (Signed)
ANESTHESIA TRANSFER OF CARE   Henry [name] is a 60 y.o. ,male, Weight: 135.8 kg (299 lb 6.2 oz)   had Procedure(s) with comments:  INSERTION HYDROGEL SPACER - fiducial placement  performed  01/30/16   Primary Service: Renee Rival*    Past Medical History:   Diagnosis Date    Cancer ([place]) 11/2015    prostate cancer    CPAP (continuous positive airway pressure) dependence     settings unknown    GERD (gastroesophageal reflux disease)     well controlled on omeprazole    Heartburn     HTN     Hyperlipidemia     Hypertension     Irritable bowel syndrome     Osteoarthritis of back     Prostate cancer ([place]) 12/11/2015    Sleep apnea     Wears glasses       Allergy History as of 01/30/16     SULFA (SULFONAMIDES)       Noted Status Severity Type Reaction    10/25/07 Elmyra Ricks, RN 10/25/07 Active   Rash          SHELLFISH CONTAINING PRODUCTS       Noted Status Severity Type Reaction    05/16/13 1057 Janann Colonel 04/04/13 Active Low  Itching, Swelling    Comments:  Shrimp - eye swelling, itching     04/04/13 1402 Gerri Lins, MA 04/04/13 Active       Comments:  shrimp               I completed my transfer of care / handoff to the receiving personnel during which we discussed:  Access, All key/critical aspects of case discussed, Antibiotics, Fluids/Product, Airway, Analgesia, Expectation of post procedure, Gave opportunity for questions and acknowledgement of understanding and PMHx                                            Additional Info:Patient transferred to PACU, vitals stable and WNL, AQA with PACU RN, PACU orders are in.    Aura Camps, MD  Anesthesiology, CA-1 (PGY-2)  Pager [phone]                      Last OR Temp: Temperature: 37.2 C (99 F)  ABG:  POTASSIUM   Date Value Ref Range Status   12/25/2015 4.2 3.5 - 5.1 mmol/L Final   05/23/2013 4.5 3.5 - 5.1 mmol/L Final     CALCIUM   Date Value Ref Range Status   12/25/2015 9.4 8.5 - 10.2 mg/dL Final   05/23/2013 8.6 8.5 - 10.4  mg/dL Final     Airway:   Blood pressure 128/66, pulse 70, temperature 37.2 C (99 F), resp. rate 13, height 1.753 m (5' 9" ), weight 135.8 kg (299 lb 6.2 oz), SpO2 97 %.

## 2016-01-30 NOTE — Cancer Center Note (Signed)
[place]    Patient Name:  Brolin Dambrosia         Date of Birth:  1956-01-14   Medical Record #:  [id number] Date of Service:  01/30/2016       DISCHARGE SUMMARY  PRINCIPAL DIAGNOSIS: prostate cancer  PROCEDURE PERFORMED: Insertion of SpaceOAR and fiducial markers  UROLOGIST: Jarvis Newcomer, MD  RADIATION ONCOLOGIST: Hulan Fess, MD     HPI: Devian Bartolomei is a 60 y.o. male with history of prostate cancer which will be treated with external beam, radiotherapy on RTOG 0924.     OPERATIVE COURSE: The patient was taken to the OR for insertion of 3 fiducials in the prostate as well as the SpaceOAR hydrogel, which creates separation between the rectum and prostate in order to reduce the rectal dose from the RT and reduce the risk of side effects. The patient's perineum was prepped in a sterile fashion and the prostate visualized using transrectal ultrasound. 3 fiducials were placed, 2 at the base bilaterally and one in the apex. Hydrodissection was then carried out in the space between bladder and rectum and the SpaceOAR hydrogel injected into this space. He tolerated the procedure well and was taken to the PACU before discharge.     FOLLOW-UP PLAN: He will return in 2 weeks for MRI prostate and CT simulation for external beam RT planning. He will take Ciprofloxacin for infection prophylaxis for the next 3 days.     Rexanne Mano. Lenore Manner, MD

## 2016-01-30 NOTE — Anesthesia Preprocedure Evaluation (Signed)
ANESTHESIA PRE-OP EVALUATION  Review of Systems     anesthesia history negative    patient summary reviewed          Pulmonary   sleep apnea and CPAP  Cardiovascular    Hypertension       GI/Hepatic/Renal   GERD and well controlled    Endo/Other   neg endo/other ROS      Neuro/Psych/MS  negative neuro/psych ROS  Cancer  CA                              Physical Assessment      Patient summary reviewed   Airway       Mallampati: II    TM distance: >3 FB    Neck ROM: limited  Mouth Opening: good.      No endotracheal tube present  Tracheostomy present    Dental           (+) edentulous           Pulmonary    Breath sounds clear to auscultation  (-) no rhonchi, no decreased breath sounds, no wheezes, no rales and no stridor     Cardiovascular    Rhythm: regular  Rate: Normal  (-) no friction rub and no murmur     Other findings            Plan  Planned anesthesia type: regional block and MAC    ASA 2     Intravenous induction   Anesthetic plan and risks discussed with patient.     Anesthesia issues/risks discussed are: Nerve Injuries, PONV, Blood Loss, Dental Injuries, Stroke, Difficult Airway, Post-op Pain Management, Post-op Intubation/Ventilation, Art Line Placement, Post-op Cognitive Dysfunction, Intraoperative Awareness/ Recall, Sore Throat, Eye /Visual Loss, [employer] Placement, Post-op Agitation/Tantrum, Aspiration and Cardiac Events/MI.    Use of blood products discussed with patient whom consented to blood products.     Patient's NPO status is appropriate for Anesthesia.           Plan discussed with resident and attending.

## 2016-02-13 ENCOUNTER — Other Ambulatory Visit: Payer: No Typology Code available for payment source

## 2016-02-13 ENCOUNTER — Other Ambulatory Visit (INDEPENDENT_AMBULATORY_CARE_PROVIDER_SITE_OTHER): Payer: Self-pay

## 2016-02-24 ENCOUNTER — Other Ambulatory Visit (HOSPITAL_COMMUNITY): Payer: Self-pay | Admitting: Radiation Oncology

## 2016-02-24 DIAGNOSIS — C61 Malignant neoplasm of prostate: Secondary | ICD-10-CM

## 2016-02-25 ENCOUNTER — Ambulatory Visit (HOSPITAL_BASED_OUTPATIENT_CLINIC_OR_DEPARTMENT_OTHER): Payer: No Typology Code available for payment source | Admitting: Family Medicine

## 2016-02-25 ENCOUNTER — Encounter (HOSPITAL_BASED_OUTPATIENT_CLINIC_OR_DEPARTMENT_OTHER): Payer: Self-pay | Admitting: Family Medicine

## 2016-02-25 ENCOUNTER — Ambulatory Visit (INDEPENDENT_AMBULATORY_CARE_PROVIDER_SITE_OTHER)
Admission: RE | Admit: 2016-02-25 | Discharge: 2016-02-25 | Disposition: A | Discharge status: Home / Self Care | Payer: No Typology Code available for payment source | Source: Ambulatory Visit | Attending: Radiation Oncology | Admitting: Radiation Oncology

## 2016-02-25 ENCOUNTER — Inpatient Hospital Stay (HOSPITAL_BASED_OUTPATIENT_CLINIC_OR_DEPARTMENT_OTHER)
Admission: RE | Admit: 2016-02-25 | Discharge: 2016-02-25 | Disposition: A | Discharge status: Still In Hospital | Payer: No Typology Code available for payment source | Source: Ambulatory Visit | Attending: Radiation Oncology | Admitting: Radiation Oncology

## 2016-02-25 VITALS — BP 150/86 | HR 76 | Temp 96.8°F | Ht 69.96 in | Wt 302.0 lb

## 2016-02-25 DIAGNOSIS — I10 Essential (primary) hypertension: Principal | ICD-10-CM

## 2016-02-25 DIAGNOSIS — C61 Malignant neoplasm of prostate: Secondary | ICD-10-CM

## 2016-02-25 DIAGNOSIS — N402 Nodular prostate without lower urinary tract symptoms: Secondary | ICD-10-CM

## 2016-02-25 DIAGNOSIS — E7800 Pure hypercholesterolemia, unspecified: Secondary | ICD-10-CM | POA: Insufficient documentation

## 2016-02-25 DIAGNOSIS — Z510 Encounter for antineoplastic radiation therapy: Secondary | ICD-10-CM | POA: Insufficient documentation

## 2016-02-25 DIAGNOSIS — N3289 Other specified disorders of bladder: Secondary | ICD-10-CM

## 2016-02-25 LAB — POC ISTAT CREATININE (RESULT): CREATININE, POC: 0.8 mg/dL (ref 0.62–1.27)

## 2016-02-25 MED ORDER — GADOBENATE DIMEGLUMINE 529 MG/ML(0.1 MMOL/0.2 ML) INTRAVENOUS SOLUTION
20.00 mL | INTRAVENOUS | Status: AC
Start: 2016-02-25 — End: 2016-02-25
  Administered 2016-02-25: 14:00:00 20 mL via INTRAVENOUS

## 2016-02-25 NOTE — Progress Notes (Signed)
Subjective:     Patient ID:  Henry [name] is an 61 y.o. male   Chief Complaint:    Chief Complaint   Patient presents with    Hypertension    Prostate Cancer       HPI Comments: Henry [name] is a 61 y.o. male following up for hypertension.  As he has been recently diagnosed with prostate cancer and will be undergoing radiation therapy.  His procedures start next week and will run between 5 and 8 weeks.  Patient discussed with me his understanding of the risks benefits and alternatives and he feels all questions were answered to his satisfaction.      ALLERGIES:   -- Sulfa (Sulfonamides) -- Rash   -- Shellfish Containing Products -- Itching and Swelling    --  Shrimp - eye swelling, itching    Current Outpatient Prescriptions:  atorvastatin (LIPITOR) 20 mg Oral Tablet, TAKE 1 TAB (20 MG TOTAL) BY MOUTH EVERY EVENING  bicalutamide (CASODEX) 50 mg Oral Tablet, Take 1 Tab (50 mg total) by mouth Once a day  diclofenac sodium (VOLTAREN) 75 mg Oral Tablet, Delayed Release (E.C.), TAKE 1 TABLET (75 MG TOTAL) BY MOUTH TWICE DAILY  dicyclomine (BENTYL) 20 mg Oral Tablet, Take 1 Tab (20 mg total) by mouth Four times a day  Ferrous Sulfate (SLOW RELEASE IRON) 142 mg (45 mg iron) Oral Tablet Sustained Release, Take 1 Tab (142 mg total) by mouth Three times a day as needed  GLUCOS CHOND CPLX ADVANCED ORAL, qd  HYDROcodone-acetaminophen (NORCO) 5-325 mg Oral Tablet, Take 1 Tab by mouth Every 6 hours as needed for Pain (Patient not taking: Reported on 02/25/2016)  metoprolol (LOPRESSOR) 25 mg Oral Tablet, Take 1 Tab (25 mg total) by mouth Twice daily  metroNIDAZOLE (METROGEL) 1 % Gel with Pump, by Apply externally route Once a day  Omeprazole 20 mg Oral Tablet, Delayed Release (E.C.), take  by mouth.          Patient is a 61 y.o. male presenting with hypertension.   Hypertension         Review of Systems   Constitutional: Negative.    HENT: Negative.    Respiratory: Negative.    Cardiovascular: Negative.       Genitourinary: Negative.    Psychiatric/Behavioral: Negative.      Objective:   Physical Exam   Constitutional: He is oriented to person, place, and time. He appears well-developed and well-nourished.   BP (!) 150/86   Pulse 76   Temp 36 C (96.8 F)   Ht 1.777 m (5' 9.96")   Wt (!) 137 kg (302 lb 0.5 oz)   BMI 43.39 kg/m2    His repeat blood pressure in the clinic by me 138/84     Cardiovascular: Regular rhythm.    Pulmonary/Chest: Effort normal and breath sounds normal.   Neurological: He is alert and oriented to person, place, and time.   Psychiatric: He has a normal mood and affect.     Ortho Exam    Assessment & Plan:     (I10) Hypertension  (primary encounter diagnosis)  Plan:  Blood pressure acceptable my repeat 138/84.  Continue current care and follow-up    (C61) Prostate cancer [facility])  Plan:  Treatment plan and follow up has been established if he needs my assistance or has questions he will message me through my chart       (E78.00) Hypercholesterolemia  Plan: LIPID PANEL  Fasting lipid panel ordered I will review and inform him of results.

## 2016-03-03 ENCOUNTER — Ambulatory Visit (HOSPITAL_BASED_OUTPATIENT_CLINIC_OR_DEPARTMENT_OTHER)
Admission: RE | Admit: 2016-03-03 | Discharge: 2016-03-03 | Disposition: A | Discharge status: Still In Hospital | Payer: No Typology Code available for payment source | Source: Ambulatory Visit

## 2016-03-03 DIAGNOSIS — C61 Malignant neoplasm of prostate: Secondary | ICD-10-CM

## 2016-03-04 ENCOUNTER — Inpatient Hospital Stay (HOSPITAL_BASED_OUTPATIENT_CLINIC_OR_DEPARTMENT_OTHER)
Admission: RE | Admit: 2016-03-04 | Discharge: 2016-03-04 | Disposition: A | Discharge status: Still In Hospital | Payer: No Typology Code available for payment source | Source: Ambulatory Visit

## 2016-03-04 DIAGNOSIS — C61 Malignant neoplasm of prostate: Secondary | ICD-10-CM

## 2016-03-05 ENCOUNTER — Ambulatory Visit (HOSPITAL_BASED_OUTPATIENT_CLINIC_OR_DEPARTMENT_OTHER)
Admission: RE | Admit: 2016-03-05 | Discharge: 2016-03-05 | Disposition: A | Discharge status: Still In Hospital | Payer: No Typology Code available for payment source | Source: Ambulatory Visit | Attending: Radiation Oncology | Admitting: Radiation Oncology

## 2016-03-05 ENCOUNTER — Encounter (HOSPITAL_COMMUNITY): Payer: Self-pay | Admitting: Radiation Oncology

## 2016-03-05 DIAGNOSIS — C61 Malignant neoplasm of prostate: Secondary | ICD-10-CM

## 2016-03-05 DIAGNOSIS — Z510 Encounter for antineoplastic radiation therapy: Secondary | ICD-10-CM

## 2016-03-05 NOTE — Progress Notes (Signed)
RTOG 0924-Prior to radiation therapy, study labs were drawn and processed as per protocol.  Samples to be frozen and held in [facility] lab until the next set of study labs is drawn the last week of RT.  Pt is doing well.  Denies any new problems since I last spoke with him.      Baseline conditions and conmeds were reviewed with patient as below:    Hypertension-since about 2008-treated with metoprolol 25 mg bid since 2008  Hyperlipidemia - since around 2008-treated with Lipitor 20 mg daily since 2008  Osteoarthritis- since around 2008-treated with Voltaren 75 mg bid since 2008 and Glucosamine chondroitin once daily since 2008.   Rosacea-since 2011-Uses metrogel 1% gel with pump daily since 2011  Gastroesophageal Reflux- since 2011-takes Omeprazol 20 mg daily  Irritable Bowel Syndrome - since 1983- He uses Metamucil daily and Bentyl 20 mg qid.  Both of these medications were started in 1983.  Sleep Apnea since about 2012-Treated with CPAP at night    Pt also takes Ferrous Sulfate 142 mg daily and a Multivitamin since he had an episode of anemia in 2015.  Pt is no longer anemic, but remains on the iron and MVI for his health.    Pt is still taking Casodex daily and does have hot flashes.  Can happen anywhere from no hot flashes a day or numerous hot flashes a day.  Pt has not been keeping his Casodex diary, but assures me that he has not missed any doses.  Pt would like to remain in trial and will be here daily for radiation treatments.  Pt has my number and the number of the clinic and knows to call me with any new questions or issues.  Lorn Junes, RN

## 2016-03-06 ENCOUNTER — Ambulatory Visit
Admission: RE | Admit: 2016-03-06 | Discharge: 2016-03-06 | Disposition: A | Discharge status: Still In Hospital | Payer: No Typology Code available for payment source | Source: Ambulatory Visit | Attending: Radiation Oncology | Admitting: Radiation Oncology

## 2016-03-06 DIAGNOSIS — C61 Malignant neoplasm of prostate: Secondary | ICD-10-CM

## 2016-03-09 ENCOUNTER — Ambulatory Visit (HOSPITAL_BASED_OUTPATIENT_CLINIC_OR_DEPARTMENT_OTHER)
Admission: RE | Admit: 2016-03-09 | Discharge: 2016-03-09 | Disposition: A | Discharge status: Still In Hospital | Payer: No Typology Code available for payment source | Source: Ambulatory Visit | Attending: Radiation Oncology | Admitting: Radiation Oncology

## 2016-03-09 DIAGNOSIS — C61 Malignant neoplasm of prostate: Secondary | ICD-10-CM

## 2016-03-09 DIAGNOSIS — Z510 Encounter for antineoplastic radiation therapy: Secondary | ICD-10-CM | POA: Insufficient documentation

## 2016-03-10 ENCOUNTER — Encounter (HOSPITAL_COMMUNITY): Payer: Self-pay | Admitting: Radiation Oncology

## 2016-03-10 ENCOUNTER — Ambulatory Visit (HOSPITAL_BASED_OUTPATIENT_CLINIC_OR_DEPARTMENT_OTHER)
Admission: RE | Admit: 2016-03-10 | Discharge: 2016-03-10 | Disposition: A | Discharge status: Still In Hospital | Payer: No Typology Code available for payment source | Source: Ambulatory Visit | Attending: Radiation Oncology | Admitting: Radiation Oncology

## 2016-03-10 DIAGNOSIS — C61 Malignant neoplasm of prostate: Secondary | ICD-10-CM

## 2016-03-10 NOTE — Progress Notes (Signed)
RTOG 0233 - Pt seen today prior to radiation therapy treatment and is doing well.  He states that he might be using the restroom more frequently than previous, but he is not sure if he is just more aware now that he has started treatments.  His biggest complaint is hot flashes associated with the Casodex dosing.  He is still taking the Casodex daily and states he has not missed any doses.  He is not keeping a Casodex diary.  Denies any GI upset or diarrhea at this point. Has not taken any new medications since last encounter.   Pt would like to continue in trial and will be here daily to complete the radiation therapy.  He has my number and the number of the clinic and knows to call with any new questions or problems.  Lorn Junes, RN

## 2016-03-11 ENCOUNTER — Inpatient Hospital Stay (HOSPITAL_BASED_OUTPATIENT_CLINIC_OR_DEPARTMENT_OTHER)
Admission: RE | Admit: 2016-03-11 | Discharge: 2016-03-11 | Disposition: A | Discharge status: Still In Hospital | Payer: No Typology Code available for payment source | Source: Ambulatory Visit | Attending: Radiation Oncology | Admitting: Radiation Oncology

## 2016-03-11 DIAGNOSIS — C61 Malignant neoplasm of prostate: Secondary | ICD-10-CM

## 2016-03-11 NOTE — Progress Notes (Signed)
[facility]  RADIATION ONCOLOGY ON-TREATMENT NOTE    Patient Name: Henry [name]  Med Record #: [id number]  Date of Birth:  December 18, 1955    DIAGNOSIS & STAGE int risk prostate cancer (T1c, GS3+4, PSA 5.5)   RADIOTHERAPY DATES 03/05/16   RADIOTHERAPY TECHNIQUE IMRT   DOSE PRESCRIPTION & SITE 9 Gy in 5/25 fractions to prostate + seminal vesicles + pelvic nodes, followed by a 19 fraction boost to prostate + proximal seminal vesicles   CHEMO/SYSTEMIC THERAPY 6 months Lupron/Casodex   LABS/IMAGING All radiation therapy-related imaging (portal images, IGRT) was reviewed offline. The patient's daily setup has been appropriate for treatment.    ASSESSMENT (exam/pain/tolerance) No side effects, exam stable, ECOG 0, denies pain   PLAN Continue RT

## 2016-03-12 ENCOUNTER — Ambulatory Visit (HOSPITAL_BASED_OUTPATIENT_CLINIC_OR_DEPARTMENT_OTHER)
Admission: RE | Admit: 2016-03-12 | Discharge: 2016-03-12 | Disposition: A | Discharge status: Still In Hospital | Payer: No Typology Code available for payment source | Source: Ambulatory Visit | Attending: Radiation Oncology | Admitting: Radiation Oncology

## 2016-03-12 DIAGNOSIS — C61 Malignant neoplasm of prostate: Secondary | ICD-10-CM

## 2016-03-12 DIAGNOSIS — Z006 Encounter for examination for normal comparison and control in clinical research program: Secondary | ICD-10-CM | POA: Insufficient documentation

## 2016-03-12 DIAGNOSIS — Z510 Encounter for antineoplastic radiation therapy: Secondary | ICD-10-CM

## 2016-03-13 ENCOUNTER — Inpatient Hospital Stay (HOSPITAL_BASED_OUTPATIENT_CLINIC_OR_DEPARTMENT_OTHER)
Admission: RE | Admit: 2016-03-13 | Discharge: 2016-03-13 | Disposition: A | Discharge status: Still In Hospital | Payer: No Typology Code available for payment source | Source: Ambulatory Visit | Attending: Radiation Oncology | Admitting: Radiation Oncology

## 2016-03-13 DIAGNOSIS — C61 Malignant neoplasm of prostate: Secondary | ICD-10-CM

## 2016-03-13 NOTE — Progress Notes (Signed)
[facility]  RADIATION ONCOLOGY ON-TREATMENT NOTE    Patient Name: Henry [name]  Med Record #: [id number]  Date of Birth:  [birth year]      DIAGNOSIS & STAGE int risk prostate cancer (T1c, GS3+4, PSA 5.5)   RADIOTHERAPY DATES 03/05/16   RADIOTHERAPY TECHNIQUE IMRT   DOSE PRESCRIPTION & SITE 12.6 Gy in 7/25 fractions to prostate + seminal vesicles + pelvic nodes, followed by a 19 fraction boost to prostate + proximal seminal vesicles   CHEMO/SYSTEMIC THERAPY 6 months Lupron/Casodex   LABS/IMAGING All radiation therapy-related imaging (portal images, IGRT) was reviewed offline. The patient's daily setup has been appropriate for treatment.    ASSESSMENT (exam/pain/tolerance) No side effects, exam stable, ECOG 0, denies pain   PLAN Continue RT

## 2016-03-16 ENCOUNTER — Ambulatory Visit (HOSPITAL_BASED_OUTPATIENT_CLINIC_OR_DEPARTMENT_OTHER)
Admission: RE | Admit: 2016-03-16 | Discharge: 2016-03-16 | Disposition: A | Discharge status: Still In Hospital | Payer: No Typology Code available for payment source | Source: Ambulatory Visit | Attending: Radiation Oncology | Admitting: Radiation Oncology

## 2016-03-16 DIAGNOSIS — C61 Malignant neoplasm of prostate: Secondary | ICD-10-CM

## 2016-03-17 ENCOUNTER — Inpatient Hospital Stay (HOSPITAL_BASED_OUTPATIENT_CLINIC_OR_DEPARTMENT_OTHER)
Admission: RE | Admit: 2016-03-17 | Discharge: 2016-03-17 | Disposition: A | Discharge status: Still In Hospital | Payer: No Typology Code available for payment source | Source: Ambulatory Visit | Attending: Radiation Oncology | Admitting: Radiation Oncology

## 2016-03-17 DIAGNOSIS — C61 Malignant neoplasm of prostate: Secondary | ICD-10-CM

## 2016-03-18 ENCOUNTER — Ambulatory Visit (HOSPITAL_BASED_OUTPATIENT_CLINIC_OR_DEPARTMENT_OTHER)
Admission: RE | Admit: 2016-03-18 | Discharge: 2016-03-18 | Disposition: A | Discharge status: Still In Hospital | Payer: No Typology Code available for payment source | Source: Ambulatory Visit | Attending: Radiation Oncology | Admitting: Radiation Oncology

## 2016-03-18 ENCOUNTER — Encounter (HOSPITAL_COMMUNITY): Payer: Self-pay | Admitting: Radiation Oncology

## 2016-03-18 DIAGNOSIS — C61 Malignant neoplasm of prostate: Secondary | ICD-10-CM

## 2016-03-18 NOTE — Progress Notes (Signed)
RTOG 8871 - Pt was in for RT treatment today.  He is currently in his 3rd week of radiation treatment.  Pt is doing well. Still has complaints of periodic hot flashes that were documented at baseline prior to starting radiation therapy.  Also states that he has noticed some urinary frequency during the daytime hours.  Is not up to use the restroom any more at night.  Also is now complaining of intermittent diarrhea.  He states this is not an everyday occurrence and his diarrhea improves over the weekend.  The patient has a history of irritable bowel syndrome and  is not sure if this is a flare up of this or as a result of radiation therapy.  The diarrhea is not bad enough that treatment is warranted at this time.  Pt denies any other new problems or new medication use. CBC or imaging not indicated for today's visit. Pt would like to continue in trial and will return every day for treatments.  Pt has my number and the number of the clinic and knows to call with any new problems or questions.  Lorn Junes, RN

## 2016-03-19 ENCOUNTER — Ambulatory Visit (HOSPITAL_BASED_OUTPATIENT_CLINIC_OR_DEPARTMENT_OTHER)
Admission: RE | Admit: 2016-03-19 | Discharge: 2016-03-19 | Disposition: A | Discharge status: Still In Hospital | Payer: No Typology Code available for payment source | Source: Ambulatory Visit | Attending: Radiation Oncology | Admitting: Radiation Oncology

## 2016-03-19 DIAGNOSIS — Z510 Encounter for antineoplastic radiation therapy: Secondary | ICD-10-CM

## 2016-03-19 DIAGNOSIS — C61 Malignant neoplasm of prostate: Secondary | ICD-10-CM

## 2016-03-20 ENCOUNTER — Inpatient Hospital Stay (HOSPITAL_BASED_OUTPATIENT_CLINIC_OR_DEPARTMENT_OTHER)
Admission: RE | Admit: 2016-03-20 | Discharge: 2016-03-20 | Disposition: A | Discharge status: Still In Hospital | Payer: No Typology Code available for payment source | Source: Ambulatory Visit | Attending: Radiation Oncology | Admitting: Radiation Oncology

## 2016-03-20 DIAGNOSIS — C61 Malignant neoplasm of prostate: Secondary | ICD-10-CM

## 2016-03-20 NOTE — Progress Notes (Signed)
[facility]  RADIATION ONCOLOGY ON-TREATMENT NOTE    Patient Name: Henry [name]  Med Record #: [id number]  Date of Birth:  04/07/1955      DIAGNOSIS & STAGE int risk prostate cancer (T1c, GS3+4, PSA 5.5)   RADIOTHERAPY DATES 03/05/16 - present   RADIOTHERAPY TECHNIQUE IMRT   DOSE PRESCRIPTION & SITE 21.6 Gy in 12/25 fractions to prostate + seminal vesicles + pelvic nodes, followed by a 19 fraction boost to prostate + proximal seminal vesicles   CHEMO/SYSTEMIC THERAPY 6 months Lupron/Casodex   LABS/IMAGING All radiation therapy-related imaging (portal images, IGRT) was reviewed offline. The patient's daily setup has been appropriate for treatment.    ASSESSMENT (exam/pain/tolerance) Exam stable, ECOG 0, denies pain. Grade 2 diarrhea for which I started immodium. Also mild urinary frequency.    PLAN Continue RT

## 2016-03-23 ENCOUNTER — Ambulatory Visit (HOSPITAL_BASED_OUTPATIENT_CLINIC_OR_DEPARTMENT_OTHER)
Admission: RE | Admit: 2016-03-23 | Discharge: 2016-03-23 | Disposition: A | Discharge status: Still In Hospital | Payer: No Typology Code available for payment source | Source: Ambulatory Visit | Attending: Radiation Oncology | Admitting: Radiation Oncology

## 2016-03-23 DIAGNOSIS — C61 Malignant neoplasm of prostate: Secondary | ICD-10-CM

## 2016-03-24 ENCOUNTER — Encounter (HOSPITAL_COMMUNITY): Payer: Self-pay | Admitting: Radiation Oncology

## 2016-03-24 ENCOUNTER — Inpatient Hospital Stay (HOSPITAL_BASED_OUTPATIENT_CLINIC_OR_DEPARTMENT_OTHER)
Admission: RE | Admit: 2016-03-24 | Discharge: 2016-03-24 | Disposition: A | Discharge status: Still In Hospital | Payer: No Typology Code available for payment source | Source: Ambulatory Visit | Attending: Radiation Oncology | Admitting: Radiation Oncology

## 2016-03-24 DIAGNOSIS — C61 Malignant neoplasm of prostate: Secondary | ICD-10-CM

## 2016-03-24 NOTE — Progress Notes (Signed)
RTOG 0924 - Pt was in for week 4 of radiation treatments.  He is feeling ok.  After physician talked with patient last week, he felt diarrhea required treatment and the pt was started on Immodium. The patient states that he is only taking one or two tablets a day and the diarrhea is under control.  Pt has also noticed that he is having some urinary hesitancy with some difficulty starting stream.  Dr. Lenore Manner feels he is showing slight symptoms of urinary obstruction.  No intervention required at this time.  Pt has also noticed and increase in fatigue.  He is able to complete all activities, but feels he has to push himself to get everything done.  He is taking Casodex daily and denies any missed doses.  He is not keeping a calendar of Casodex dosing.  Current Ongoing AEs:    Urinary Frequency  Gr 1  Intermittent Diarrhea Gr 2  Urinary Tract Obstruction Gr 1  Fatigue Gr 1    Pt wants to continue in trial and will return for daily radiation treatments.  CBC or imaging not indicated at this time.  He has my number and the number of the clinic and knows to call with any questions or new problems.  Lorn Junes, RN

## 2016-03-24 NOTE — Progress Notes (Signed)
[facility]  RADIATION ONCOLOGY ON-TREATMENT NOTE    Patient Name: Henry [name]  Med Record #: [id number]  Date of Birth:  September 02, 1955      DIAGNOSIS & STAGE int risk prostate cancer (T1c, GS3+4, PSA 5.5)   RADIOTHERAPY DATES 03/05/16 - present   RADIOTHERAPY TECHNIQUE IMRT   DOSE PRESCRIPTION & SITE 25.2Gy in 14/25 fractions to prostate + seminal vesicles + pelvic nodes, followed by a 19 fraction boost to prostate + proximal seminal vesicles   CHEMO/SYSTEMIC THERAPY 6 months Lupron/Casodex   LABS/IMAGING All radiation therapy-related imaging (portal images, IGRT) was reviewed offline. The patient's daily setup has been appropriate for treatment.    ASSESSMENT (exam/pain/tolerance) Exam stable, ECOG 0, denies pain. Grade 2 diarrhea improved on immodium. Also mild urinary obstructive symptoms not requiring medications.    PLAN Continue RT

## 2016-03-25 ENCOUNTER — Ambulatory Visit (HOSPITAL_BASED_OUTPATIENT_CLINIC_OR_DEPARTMENT_OTHER)
Admission: RE | Admit: 2016-03-25 | Discharge: 2016-03-25 | Disposition: A | Discharge status: Still In Hospital | Payer: No Typology Code available for payment source | Source: Ambulatory Visit

## 2016-03-25 ENCOUNTER — Ambulatory Visit (HOSPITAL_COMMUNITY): Admission: RE | Admit: 2016-03-25 | Payer: No Typology Code available for payment source | Source: Ambulatory Visit

## 2016-03-25 DIAGNOSIS — C61 Malignant neoplasm of prostate: Secondary | ICD-10-CM

## 2016-03-26 ENCOUNTER — Ambulatory Visit (HOSPITAL_BASED_OUTPATIENT_CLINIC_OR_DEPARTMENT_OTHER)
Admission: RE | Admit: 2016-03-26 | Discharge: 2016-03-26 | Disposition: A | Discharge status: Still In Hospital | Payer: No Typology Code available for payment source | Source: Ambulatory Visit | Attending: Radiation Oncology | Admitting: Radiation Oncology

## 2016-03-26 DIAGNOSIS — C61 Malignant neoplasm of prostate: Secondary | ICD-10-CM

## 2016-03-27 ENCOUNTER — Inpatient Hospital Stay (HOSPITAL_BASED_OUTPATIENT_CLINIC_OR_DEPARTMENT_OTHER)
Admission: RE | Admit: 2016-03-27 | Discharge: 2016-03-27 | Disposition: A | Discharge status: Still In Hospital | Payer: No Typology Code available for payment source | Source: Ambulatory Visit | Attending: Radiation Oncology | Admitting: Radiation Oncology

## 2016-03-27 DIAGNOSIS — Z510 Encounter for antineoplastic radiation therapy: Secondary | ICD-10-CM

## 2016-03-27 DIAGNOSIS — C61 Malignant neoplasm of prostate: Secondary | ICD-10-CM

## 2016-03-27 NOTE — Progress Notes (Signed)
[facility]  RADIATION ONCOLOGY ON-TREATMENT NOTE    Patient Name: Henry [name]  Med Record #: [id number]  Date of Birth:  [birth year]    DIAGNOSIS & STAGE int risk prostate cancer (T1c, GS3+4, PSA 5.5)   RADIOTHERAPY DATES 03/05/16 - present   RADIOTHERAPY TECHNIQUE IMRT   DOSE PRESCRIPTION & SITE 28.8Gy in 16/25 fractions to prostate + seminal vesicles + pelvic nodes, followed by a 19 fraction boost to prostate + proximal seminal vesicles   CHEMO/SYSTEMIC THERAPY 6 months Lupron/Casodex   LABS/IMAGING All radiation therapy-related imaging (portal images, IGRT) was reviewed offline. The patient's daily setup has been appropriate for treatment.    ASSESSMENT (exam/pain/tolerance) Exam stable, ECOG 0, Grade 2 diarrhea improved on immodium. Grade 1 dysuria and frequency not requiring medications.    PLAN Continue RT

## 2016-03-30 ENCOUNTER — Ambulatory Visit (HOSPITAL_BASED_OUTPATIENT_CLINIC_OR_DEPARTMENT_OTHER)
Admission: RE | Admit: 2016-03-30 | Discharge: 2016-03-30 | Disposition: A | Discharge status: Still In Hospital | Payer: No Typology Code available for payment source | Source: Ambulatory Visit | Attending: Radiation Oncology | Admitting: Radiation Oncology

## 2016-03-30 DIAGNOSIS — C61 Malignant neoplasm of prostate: Secondary | ICD-10-CM

## 2016-03-31 ENCOUNTER — Inpatient Hospital Stay (HOSPITAL_BASED_OUTPATIENT_CLINIC_OR_DEPARTMENT_OTHER)
Admission: RE | Admit: 2016-03-31 | Discharge: 2016-03-31 | Disposition: A | Discharge status: Still In Hospital | Payer: No Typology Code available for payment source | Source: Ambulatory Visit | Attending: Radiation Oncology | Admitting: Radiation Oncology

## 2016-03-31 ENCOUNTER — Encounter (HOSPITAL_BASED_OUTPATIENT_CLINIC_OR_DEPARTMENT_OTHER): Payer: Self-pay | Admitting: Radiation Oncology

## 2016-03-31 DIAGNOSIS — C61 Malignant neoplasm of prostate: Secondary | ICD-10-CM

## 2016-03-31 NOTE — Nursing Note (Signed)
0924  RT Day 18.  He reports infrequent loose stool eased by Imodium, he reports continued urinary frequency, urinary tract obstruction and fatigue.  He indicated that nothing noted above is worse.  He denies new complaints or problems.  He also will see Dr. Lenore Manner today.  He returns tomorrow.  Roylene Reason RN

## 2016-03-31 NOTE — Progress Notes (Signed)
[facility]  RADIATION ONCOLOGY ON-TREATMENT NOTE    Patient Name: Henry [name]  Med Record #: [id number]  Date of Birth:  12/21/1955      DIAGNOSIS & STAGE int risk prostate cancer (T1c, GS3+4, PSA 5.5)   RADIOTHERAPY DATES 03/05/16 - present   RADIOTHERAPY TECHNIQUE IMRT   DOSE PRESCRIPTION & SITE 32.4 Gy in 18/25 fractions to prostate + seminal vesicles + pelvic nodes, followed by a 19 fraction boost to prostate + proximal seminal vesicles   CHEMO/SYSTEMIC THERAPY 6 months Lupron/Casodex   LABS/IMAGING All radiation therapy-related imaging (portal images, IGRT) was reviewed offline. The patient's daily setup has been appropriate for treatment.    ASSESSMENT (exam/pain/tolerance) Exam stable, ECOG 0, Grade 2 diarrhea improved on immodium. Grade 1 dysuria and frequency not requiring medications.    PLAN Continue RT

## 2016-04-01 ENCOUNTER — Ambulatory Visit (HOSPITAL_BASED_OUTPATIENT_CLINIC_OR_DEPARTMENT_OTHER)
Admission: RE | Admit: 2016-04-01 | Discharge: 2016-04-01 | Disposition: A | Discharge status: Still In Hospital | Payer: No Typology Code available for payment source | Source: Ambulatory Visit | Attending: Radiation Oncology | Admitting: Radiation Oncology

## 2016-04-01 DIAGNOSIS — C61 Malignant neoplasm of prostate: Secondary | ICD-10-CM

## 2016-04-02 ENCOUNTER — Ambulatory Visit (HOSPITAL_BASED_OUTPATIENT_CLINIC_OR_DEPARTMENT_OTHER)
Admission: RE | Admit: 2016-04-02 | Discharge: 2016-04-02 | Disposition: A | Discharge status: Still In Hospital | Payer: No Typology Code available for payment source | Source: Ambulatory Visit | Attending: Radiation Oncology | Admitting: Radiation Oncology

## 2016-04-02 DIAGNOSIS — C61 Malignant neoplasm of prostate: Secondary | ICD-10-CM

## 2016-04-03 ENCOUNTER — Ambulatory Visit (HOSPITAL_BASED_OUTPATIENT_CLINIC_OR_DEPARTMENT_OTHER)
Admission: RE | Admit: 2016-04-03 | Discharge: 2016-04-03 | Disposition: A | Discharge status: Still In Hospital | Payer: No Typology Code available for payment source | Source: Ambulatory Visit | Attending: Radiation Oncology | Admitting: Radiation Oncology

## 2016-04-03 DIAGNOSIS — C61 Malignant neoplasm of prostate: Secondary | ICD-10-CM

## 2016-04-03 DIAGNOSIS — Z510 Encounter for antineoplastic radiation therapy: Secondary | ICD-10-CM

## 2016-04-06 ENCOUNTER — Ambulatory Visit (HOSPITAL_BASED_OUTPATIENT_CLINIC_OR_DEPARTMENT_OTHER)
Admission: RE | Admit: 2016-04-06 | Discharge: 2016-04-06 | Disposition: A | Discharge status: Still In Hospital | Payer: No Typology Code available for payment source | Source: Ambulatory Visit | Attending: Radiation Oncology | Admitting: Radiation Oncology

## 2016-04-06 DIAGNOSIS — C61 Malignant neoplasm of prostate: Secondary | ICD-10-CM

## 2016-04-07 ENCOUNTER — Ambulatory Visit
Admission: RE | Admit: 2016-04-07 | Discharge: 2016-04-07 | Disposition: A | Discharge status: Still In Hospital | Payer: No Typology Code available for payment source | Source: Ambulatory Visit | Attending: Radiation Oncology | Admitting: Radiation Oncology

## 2016-04-07 DIAGNOSIS — C61 Malignant neoplasm of prostate: Secondary | ICD-10-CM

## 2016-04-08 ENCOUNTER — Encounter (HOSPITAL_COMMUNITY): Payer: Self-pay | Admitting: Radiation Oncology

## 2016-04-08 ENCOUNTER — Inpatient Hospital Stay (HOSPITAL_BASED_OUTPATIENT_CLINIC_OR_DEPARTMENT_OTHER)
Admission: RE | Admit: 2016-04-08 | Discharge: 2016-04-08 | Disposition: A | Discharge status: Still In Hospital | Payer: No Typology Code available for payment source | Source: Ambulatory Visit | Attending: Radiation Oncology | Admitting: Radiation Oncology

## 2016-04-08 DIAGNOSIS — Z510 Encounter for antineoplastic radiation therapy: Secondary | ICD-10-CM | POA: Insufficient documentation

## 2016-04-08 DIAGNOSIS — C61 Malignant neoplasm of prostate: Secondary | ICD-10-CM

## 2016-04-08 DIAGNOSIS — R350 Frequency of micturition: Secondary | ICD-10-CM | POA: Insufficient documentation

## 2016-04-08 DIAGNOSIS — R3915 Urgency of urination: Secondary | ICD-10-CM | POA: Insufficient documentation

## 2016-04-08 DIAGNOSIS — R300 Dysuria: Secondary | ICD-10-CM | POA: Insufficient documentation

## 2016-04-08 DIAGNOSIS — R197 Diarrhea, unspecified: Secondary | ICD-10-CM | POA: Insufficient documentation

## 2016-04-08 NOTE — Progress Notes (Signed)
RTOG 0924 - Pt is currently in week 6 of radiation therapy.  He is doing well, but still having problems with intermittent diarrhea.  He states it is worse later in the week.  Immodium is still keeping the diarrhea under control pretty much and does not want to change treatments at this time.  If the diarrhea gets any worse , he will let us know.  Grade 1 urinary frequency, urinary tract obstruction, and fatigue are still ongoing and unchanged.  Pt wants to continue in trial and will continue daily treatments for radiation therapy. Pt has my number and the number of the clinic and knows to call with any new problems or questions.  Lorn Junes, RN

## 2016-04-09 ENCOUNTER — Inpatient Hospital Stay (HOSPITAL_BASED_OUTPATIENT_CLINIC_OR_DEPARTMENT_OTHER)
Admission: RE | Admit: 2016-04-09 | Discharge: 2016-04-09 | Disposition: A | Discharge status: Still In Hospital | Payer: No Typology Code available for payment source | Source: Ambulatory Visit | Attending: Radiation Oncology | Admitting: Radiation Oncology

## 2016-04-09 DIAGNOSIS — C61 Malignant neoplasm of prostate: Secondary | ICD-10-CM

## 2016-04-09 NOTE — Progress Notes (Signed)
[facility]  RADIATION ONCOLOGY ON-TREATMENT NOTE    Patient Name: Henry [name]  Med Record #: [id number]  Date of Birth:  19-Aug-1955    DIAGNOSIS & STAGE int risk prostate cancer (T1c, GS3+4, PSA 5.5)   RADIOTHERAPY DATES 03/05/16 - present   RADIOTHERAPY TECHNIQUE IMRT   DOSE PRESCRIPTION & SITE 43.2 Gy in 24/25 fractions to prostate + seminal vesicles + pelvic nodes, followed by a 19 fraction boost to prostate + proximal seminal vesicles   CHEMO/SYSTEMIC THERAPY 6 months Lupron/Casodex   LABS/IMAGING All radiation therapy-related imaging (portal images, IGRT) was reviewed offline. The patient's daily setup has been appropriate for treatment.    ASSESSMENT (exam/pain/tolerance) Exam stable, ECOG 0, continued Grade 2 diarrhea improved on immodium. Grade 1 dysuria and frequency not requiring medications.    PLAN Continue RT

## 2016-04-10 ENCOUNTER — Ambulatory Visit (HOSPITAL_BASED_OUTPATIENT_CLINIC_OR_DEPARTMENT_OTHER)
Admission: RE | Admit: 2016-04-10 | Discharge: 2016-04-10 | Disposition: A | Discharge status: Still In Hospital | Payer: No Typology Code available for payment source | Source: Ambulatory Visit | Attending: Radiation Oncology | Admitting: Radiation Oncology

## 2016-04-10 DIAGNOSIS — C61 Malignant neoplasm of prostate: Secondary | ICD-10-CM

## 2016-04-10 DIAGNOSIS — Z510 Encounter for antineoplastic radiation therapy: Secondary | ICD-10-CM

## 2016-04-13 ENCOUNTER — Ambulatory Visit (HOSPITAL_BASED_OUTPATIENT_CLINIC_OR_DEPARTMENT_OTHER)
Admission: RE | Admit: 2016-04-13 | Discharge: 2016-04-13 | Disposition: A | Discharge status: Still In Hospital | Payer: No Typology Code available for payment source | Source: Ambulatory Visit | Attending: Radiation Oncology | Admitting: Radiation Oncology

## 2016-04-13 DIAGNOSIS — C61 Malignant neoplasm of prostate: Secondary | ICD-10-CM

## 2016-04-14 ENCOUNTER — Encounter (HOSPITAL_COMMUNITY): Payer: Self-pay | Admitting: Radiation Oncology

## 2016-04-14 ENCOUNTER — Inpatient Hospital Stay (HOSPITAL_BASED_OUTPATIENT_CLINIC_OR_DEPARTMENT_OTHER)
Admission: RE | Admit: 2016-04-14 | Discharge: 2016-04-14 | Disposition: A | Discharge status: Still In Hospital | Payer: No Typology Code available for payment source | Source: Ambulatory Visit | Attending: Radiation Oncology | Admitting: Radiation Oncology

## 2016-04-14 DIAGNOSIS — C61 Malignant neoplasm of prostate: Secondary | ICD-10-CM

## 2016-04-14 NOTE — Progress Notes (Signed)
[facility]  RADIATION ONCOLOGY ON-TREATMENT NOTE    Patient Name: Henry [name]  Med Record #: [id number]  Date of Birth:  1955/09/06      DIAGNOSIS & STAGE int risk prostate cancer (T1c, GS3+4, PSA 5.5)   RADIOTHERAPY DATES 03/05/16 - present   RADIOTHERAPY TECHNIQUE IMRT (enrolled in RTOG 0924)   DOSE PRESCRIPTION & SITE 45 Gy in 25/25 fractions to prostate + seminal vesicles + pelvic nodes, followed by a 5.4 Gy in 3/19 fraction boost to prostate + proximal seminal vesicles   CHEMO/SYSTEMIC THERAPY 6 months Lupron/Casodex   LABS/IMAGING All radiation therapy-related imaging (portal images, IGRT) was reviewed offline. The patient's daily setup has been appropriate for treatment.    ASSESSMENT (exam/pain/tolerance) Exam stable, ECOG 0, continued Grade 2 diarrhea improved on immodium. Grade 1 dysuria and frequency not requiring medications.    PLAN Continue RT

## 2016-04-14 NOTE — Progress Notes (Signed)
RTOG 1594 - Pt was in for his radiation treatment today.  He is currently in week 6 of his treatments and is feeling ok.  Still taking Casodex daily.  Pt is not keeping a calendar, but states he has been taking this every day and has not missed any doses.  Urinary frequency is still present as is diarrhea.  Diarrhea seems to be a little worse now, but is using Immodium which keeps things in control.  This AE will be increased to a gr 2.  Still having some hesitancy with urine, so this will remain a gr 1 AE.  Fatigue remains, but is manageable.  Patient is also now complaining of some pain during urination, gr 1.  Denies any other new complaints and denies new conmed use.  Pt would like to continue in trial and will continue to come daily for treatments.  Pt has my number and the number of the clinic and knows to call with any new problems or questions.  Lorn Junes, RN    Current open AEs:  Urinary frequency gr 1  Intermittent diarrhea gr 2  Urinary tract obstruction gr 1  Fatigue gr 1  Dysuria gr 1    Lorn Junes, RN

## 2016-04-15 ENCOUNTER — Ambulatory Visit (HOSPITAL_BASED_OUTPATIENT_CLINIC_OR_DEPARTMENT_OTHER)
Admission: RE | Admit: 2016-04-15 | Discharge: 2016-04-15 | Disposition: A | Discharge status: Still In Hospital | Payer: No Typology Code available for payment source | Source: Ambulatory Visit | Attending: Radiation Oncology | Admitting: Radiation Oncology

## 2016-04-15 DIAGNOSIS — C61 Malignant neoplasm of prostate: Secondary | ICD-10-CM

## 2016-04-16 ENCOUNTER — Ambulatory Visit (HOSPITAL_BASED_OUTPATIENT_CLINIC_OR_DEPARTMENT_OTHER)
Admission: RE | Admit: 2016-04-16 | Discharge: 2016-04-16 | Disposition: A | Discharge status: Still In Hospital | Payer: No Typology Code available for payment source | Source: Ambulatory Visit | Attending: Radiation Oncology | Admitting: Radiation Oncology

## 2016-04-16 DIAGNOSIS — C61 Malignant neoplasm of prostate: Secondary | ICD-10-CM

## 2016-04-17 ENCOUNTER — Inpatient Hospital Stay (HOSPITAL_BASED_OUTPATIENT_CLINIC_OR_DEPARTMENT_OTHER)
Admission: RE | Admit: 2016-04-17 | Discharge: 2016-04-17 | Disposition: A | Discharge status: Still In Hospital | Payer: No Typology Code available for payment source | Source: Ambulatory Visit | Attending: Radiation Oncology | Admitting: Radiation Oncology

## 2016-04-17 DIAGNOSIS — Z510 Encounter for antineoplastic radiation therapy: Secondary | ICD-10-CM

## 2016-04-17 DIAGNOSIS — C61 Malignant neoplasm of prostate: Secondary | ICD-10-CM

## 2016-04-20 ENCOUNTER — Ambulatory Visit (HOSPITAL_BASED_OUTPATIENT_CLINIC_OR_DEPARTMENT_OTHER)
Admission: RE | Admit: 2016-04-20 | Discharge: 2016-04-20 | Disposition: A | Discharge status: Still In Hospital | Payer: No Typology Code available for payment source | Source: Ambulatory Visit | Attending: Radiation Oncology | Admitting: Radiation Oncology

## 2016-04-20 DIAGNOSIS — C61 Malignant neoplasm of prostate: Secondary | ICD-10-CM

## 2016-04-21 ENCOUNTER — Inpatient Hospital Stay (HOSPITAL_BASED_OUTPATIENT_CLINIC_OR_DEPARTMENT_OTHER)
Admission: RE | Admit: 2016-04-21 | Discharge: 2016-04-21 | Disposition: A | Discharge status: Still In Hospital | Payer: No Typology Code available for payment source | Source: Ambulatory Visit | Attending: Radiation Oncology | Admitting: Radiation Oncology

## 2016-04-21 DIAGNOSIS — C61 Malignant neoplasm of prostate: Secondary | ICD-10-CM

## 2016-04-21 NOTE — Progress Notes (Signed)
[facility]  RADIATION ONCOLOGY ON-TREATMENT NOTE    Patient Name: Henry [name]  Med Record #: [id number]  Date of Birth:  [birth year]      DIAGNOSIS & STAGE int risk prostate cancer (T1c, GS3+4, PSA 5.5)   RADIOTHERAPY DATES 03/05/16 - present   RADIOTHERAPY TECHNIQUE IMRT (enrolled in RTOG 0924)   DOSE PRESCRIPTION & SITE 45Gy in 25/25 fractions to prostate + seminal vesicles + pelvic nodes, followed by a 12.6 Gy in 7/19 fraction boost to prostate + proximal seminal vesicles   CHEMO/SYSTEMIC THERAPY 6 months Lupron/Casodex   LABS/IMAGING All radiation therapy-related imaging (portal images, IGRT) was reviewed offline. The patient's daily setup has been appropriate for treatment.    ASSESSMENT (exam/pain/tolerance) Exam stable, ECOG 0, continued Grade 2 diarrhea improved on immodium. Grade 1 dysuria and frequency not requiring medications.    PLAN Continue RT

## 2016-04-22 ENCOUNTER — Ambulatory Visit (HOSPITAL_BASED_OUTPATIENT_CLINIC_OR_DEPARTMENT_OTHER)
Admission: RE | Admit: 2016-04-22 | Discharge: 2016-04-22 | Disposition: A | Discharge status: Still In Hospital | Payer: No Typology Code available for payment source | Source: Ambulatory Visit | Attending: Radiation Oncology | Admitting: Radiation Oncology

## 2016-04-22 ENCOUNTER — Encounter (HOSPITAL_COMMUNITY): Payer: Self-pay | Admitting: Radiation Oncology

## 2016-04-22 DIAGNOSIS — C61 Malignant neoplasm of prostate: Secondary | ICD-10-CM

## 2016-04-22 NOTE — Progress Notes (Signed)
RTOG 0924 - Pt in for radiation treatment today and is feeling good.  All current open AEs are listed below and have not changed in grade or severity:    Urinary frequency Gr 1  Urinary Tract Obstruction   Gr 1  Fatigue  Gr 1  Dysuria  Gr 1  Diarrhea  Gr 2    Pt denies any new problems.  Pt is still taking Casodex daily and states that no doses have been missed. Pt is not keeping a diary of the Casodex dosing.  Pt would like to continue in trial and will be in daily to continue with radiation treatments.    Lorn Junes, RN

## 2016-04-23 ENCOUNTER — Ambulatory Visit (HOSPITAL_BASED_OUTPATIENT_CLINIC_OR_DEPARTMENT_OTHER)
Admission: RE | Admit: 2016-04-23 | Discharge: 2016-04-23 | Disposition: A | Discharge status: Still In Hospital | Payer: No Typology Code available for payment source | Source: Ambulatory Visit | Attending: Radiation Oncology | Admitting: Radiation Oncology

## 2016-04-23 DIAGNOSIS — C61 Malignant neoplasm of prostate: Secondary | ICD-10-CM

## 2016-04-24 ENCOUNTER — Ambulatory Visit (HOSPITAL_BASED_OUTPATIENT_CLINIC_OR_DEPARTMENT_OTHER)
Admission: RE | Admit: 2016-04-24 | Discharge: 2016-04-24 | Disposition: A | Discharge status: Still In Hospital | Payer: No Typology Code available for payment source | Source: Ambulatory Visit | Attending: Radiation Oncology | Admitting: Radiation Oncology

## 2016-04-24 DIAGNOSIS — Z510 Encounter for antineoplastic radiation therapy: Secondary | ICD-10-CM

## 2016-04-24 DIAGNOSIS — C61 Malignant neoplasm of prostate: Secondary | ICD-10-CM

## 2016-04-27 ENCOUNTER — Ambulatory Visit (HOSPITAL_BASED_OUTPATIENT_CLINIC_OR_DEPARTMENT_OTHER)
Admission: RE | Admit: 2016-04-27 | Discharge: 2016-04-27 | Disposition: A | Discharge status: Still In Hospital | Payer: No Typology Code available for payment source | Source: Ambulatory Visit | Attending: Radiation Oncology | Admitting: Radiation Oncology

## 2016-04-27 DIAGNOSIS — C61 Malignant neoplasm of prostate: Secondary | ICD-10-CM

## 2016-04-28 ENCOUNTER — Encounter (HOSPITAL_COMMUNITY): Payer: Self-pay | Admitting: Radiation Oncology

## 2016-04-28 ENCOUNTER — Inpatient Hospital Stay (HOSPITAL_BASED_OUTPATIENT_CLINIC_OR_DEPARTMENT_OTHER)
Admission: RE | Admit: 2016-04-28 | Discharge: 2016-04-28 | Disposition: A | Discharge status: Still In Hospital | Payer: No Typology Code available for payment source | Source: Ambulatory Visit | Attending: Radiation Oncology | Admitting: Radiation Oncology

## 2016-04-28 DIAGNOSIS — C61 Malignant neoplasm of prostate: Secondary | ICD-10-CM

## 2016-04-28 NOTE — Progress Notes (Signed)
[facility]  RADIATION ONCOLOGY ON-TREATMENT NOTE    Patient Name: Henry [name]  Med Record #: [id number]  Date of Birth:  01/30/1956      DIAGNOSIS & STAGE int risk prostate cancer (T1c, GS3+4, PSA 5.5)   RADIOTHERAPY DATES 03/05/16 - present   RADIOTHERAPY TECHNIQUE IMRT (enrolled in RTOG 0924)   DOSE PRESCRIPTION & SITE 45Gy in 25/25 fractions to prostate + seminal vesicles + pelvic nodes, followed by a 23.4 Gy in 13/19 fraction boost to prostate + proximal seminal vesicles   CHEMO/SYSTEMIC THERAPY 6 months Lupron/Casodex   LABS/IMAGING All radiation therapy-related imaging (portal images, IGRT) was reviewed offline. The patient's daily setup has been appropriate for treatment.    ASSESSMENT (exam/pain/tolerance) Exam stable, ECOG 0, continued Grade 2 diarrhea improved on immodium. Grade 1 dysuria and frequency not requiring medications.    PLAN Continue RT

## 2016-04-28 NOTE — Progress Notes (Signed)
RTOG 7544- Pt was in for weekly physician visit as per protocol during radiation therapy treatments.  Pt is feeling pretty good.  Denies any new problems and current open AEs remain ongoing without change in grade or severity. Pt is taking Casodex daily and has not missed any dosing days.  Pt is not keeping a calendar of Casodex dosing.  Pt denies any new medication use or any new problems. Last day of radiation therapy is scheduled for next week on 05/06/2016.  Study labs will be drawn next week as per protocol.  No indication today for labs or DRE with today's visit.    Current open ongoing AEs are:    Urinary frequency gr 1  Intermittent diarrhea gr 1  Urinary tract obstruction gr 1  Fatigue gr 1  Dysuria gr 1  Diarrhea gr 2    Pt has my number and the number of the clinic and knows to call with any new questions or problems.  Lorn Junes, RN

## 2016-04-29 ENCOUNTER — Ambulatory Visit (HOSPITAL_BASED_OUTPATIENT_CLINIC_OR_DEPARTMENT_OTHER)
Admission: RE | Admit: 2016-04-29 | Discharge: 2016-04-29 | Disposition: A | Discharge status: Still In Hospital | Payer: No Typology Code available for payment source | Source: Ambulatory Visit | Attending: Radiation Oncology | Admitting: Radiation Oncology

## 2016-04-29 DIAGNOSIS — C61 Malignant neoplasm of prostate: Secondary | ICD-10-CM

## 2016-04-29 DIAGNOSIS — M25552 Pain in left hip: Secondary | ICD-10-CM | POA: Diagnosis not present

## 2016-04-30 ENCOUNTER — Encounter (HOSPITAL_BASED_OUTPATIENT_CLINIC_OR_DEPARTMENT_OTHER): Payer: Self-pay | Admitting: Family Medicine

## 2016-04-30 ENCOUNTER — Ambulatory Visit (HOSPITAL_BASED_OUTPATIENT_CLINIC_OR_DEPARTMENT_OTHER)
Admission: RE | Admit: 2016-04-30 | Discharge: 2016-04-30 | Disposition: A | Discharge status: Still In Hospital | Payer: No Typology Code available for payment source | Source: Ambulatory Visit | Attending: Radiation Oncology | Admitting: Radiation Oncology

## 2016-04-30 DIAGNOSIS — C61 Malignant neoplasm of prostate: Secondary | ICD-10-CM

## 2016-04-30 NOTE — Nursing Note (Signed)
Fax sent to [facility] [phone]) with Consent to participate in a Fitness Program. Confirmation received April 30, 2016 @ 8:56 am.  Cindee Salt, LPN

## 2016-04-30 NOTE — Progress Notes (Signed)
RTOG 0924 - Pt in for week 8 of treatment.  Pt feeling good and denies any new problems.  All current open AEs were reviewed with patient and remain the same with no change in grade or severity.  Pt states he is still taking Casodex daily and has not missed any doses.  Denies any new medication use.  Pt will complete treatment on the 21st of March so study labs will be drawn next week prior to him completing treatment.  DRE or laboratories not indicated with this visit.  Pt has my number and knows to call with any new problems or questions.  Lorn Junes, RN

## 2016-05-01 ENCOUNTER — Ambulatory Visit (HOSPITAL_BASED_OUTPATIENT_CLINIC_OR_DEPARTMENT_OTHER)
Admission: RE | Admit: 2016-05-01 | Discharge: 2016-05-01 | Disposition: A | Discharge status: Still In Hospital | Payer: No Typology Code available for payment source | Source: Ambulatory Visit | Attending: Radiation Oncology | Admitting: Radiation Oncology

## 2016-05-01 DIAGNOSIS — C61 Malignant neoplasm of prostate: Secondary | ICD-10-CM

## 2016-05-01 DIAGNOSIS — Z510 Encounter for antineoplastic radiation therapy: Secondary | ICD-10-CM

## 2016-05-04 ENCOUNTER — Ambulatory Visit (HOSPITAL_BASED_OUTPATIENT_CLINIC_OR_DEPARTMENT_OTHER)
Admission: RE | Admit: 2016-05-04 | Discharge: 2016-05-04 | Disposition: A | Discharge status: Still In Hospital | Payer: No Typology Code available for payment source | Source: Ambulatory Visit | Attending: Radiation Oncology | Admitting: Radiation Oncology

## 2016-05-04 DIAGNOSIS — C61 Malignant neoplasm of prostate: Secondary | ICD-10-CM

## 2016-05-05 ENCOUNTER — Inpatient Hospital Stay (HOSPITAL_BASED_OUTPATIENT_CLINIC_OR_DEPARTMENT_OTHER)
Admission: RE | Admit: 2016-05-05 | Discharge: 2016-05-05 | Disposition: A | Discharge status: Still In Hospital | Payer: No Typology Code available for payment source | Source: Ambulatory Visit | Attending: Radiation Oncology | Admitting: Radiation Oncology

## 2016-05-05 ENCOUNTER — Encounter (HOSPITAL_COMMUNITY): Payer: Self-pay | Admitting: Radiation Oncology

## 2016-05-05 DIAGNOSIS — C61 Malignant neoplasm of prostate: Secondary | ICD-10-CM

## 2016-05-05 NOTE — Progress Notes (Signed)
[facility]  RADIATION ONCOLOGY ON-TREATMENT NOTE    Patient Name: Henry [name]  Med Record #: [id number]  Date of Birth:  1955/04/04      DIAGNOSIS & STAGE int risk prostate cancer (T1c, GS3+4, PSA 5.5)   RADIOTHERAPY DATES 03/05/16 - present   RADIOTHERAPY TECHNIQUE IMRT (enrolled in RTOG 0924)   DOSE PRESCRIPTION & SITE 45Gy in 25/25 fractions to prostate + seminal vesicles + pelvic nodes, followed by a 32.4Gy in 18/19 fraction boost to prostate + proximal seminal vesicles   CHEMO/SYSTEMIC THERAPY 6 months Lupron/Casodex   LABS/IMAGING All radiation therapy-related imaging (portal images, IGRT) was reviewed offline. The patient's daily setup has been appropriate for treatment.    ASSESSMENT (exam/pain/tolerance) Exam stable, ECOG 0, diarrhea improved and is not requiring medications (grade 1). Grade 1 dysuria and frequency not requiring medications are stable.    PLAN Continue RT

## 2016-05-05 NOTE — Progress Notes (Signed)
RTOG 0924 - Pt in during last week of radiation therapy. Pt is feeling good.  Actually, better now than he has been.  States that diarrhea has improved and has not had to use anything for the diarrhea since Friday of last week. This AE will be decreased from a gr 2 to a gr 1.  Still has complaints of fatigue, urinary urgency and frequency and slow to start urine stream and weaker stream than he had prior to treatment.These AEs will remain a a gr 1 and will be monitored at next visit to physician.  Study labs were drawn and taken to the [facility] lab for processing.  They will be frozen in the -80 freezer with his pre-treatment samples in preparation for shipment to the sponsor. Pt remains on Casodex and is still experiencing hot flashes.  Casodex was started 12/27/2015 and will continue for 6 months as per protocol.   His last dose of Casodex is to be  06-25-2016. He has not been keeping a diary to log Casodex dosing.  Pts last day of treatment is tomorrow, 05-06-2016, and follow up appt to be scheduled for 3 months.  Pt has my number and knows to call with any worsening or new problems.  Lorn Junes, RN

## 2016-05-06 ENCOUNTER — Inpatient Hospital Stay
Admission: RE | Admit: 2016-05-06 | Discharge: 2016-05-06 | Disposition: A | Discharge status: Still In Hospital | Payer: No Typology Code available for payment source | Source: Ambulatory Visit | Attending: Radiation Oncology | Admitting: Radiation Oncology

## 2016-05-06 DIAGNOSIS — C61 Malignant neoplasm of prostate: Secondary | ICD-10-CM

## 2016-05-07 ENCOUNTER — Ambulatory Visit (HOSPITAL_COMMUNITY): Payer: No Typology Code available for payment source

## 2016-05-07 NOTE — Cancer Center Note (Addendum)
[place] TREATMENT SUMMARY    Patient Name: Henry [name]  Med Record #: [id number]  Date of Birth:  1955/05/11      DIAGNOSIS & STAGE int risk prostate cancer (T1c, GS3+4, PSA 5.5)   RADIOTHERAPY DATES 03/05/16 - 05/06/16   RADIOTHERAPY TECHNIQUE IMRT (enrolled in RTOG 0924)   DOSE PRESCRIPTION & SITE 45Gy in 25 fractions to prostate + seminal vesicles + pelvic nodes, followed by a 34.2Gy in 19 fraction boost to prostate + proximal seminal vesicles   CHEMO/SYSTEMIC THERAPY 6 months Lupron/Casodex   ASSESSMENT The patient tolerated RT very well with some mild diarrhea, urinary frequency and urgency but no major side effects. ECOG performance status was 0 throughout treatment.    PLAN He will follow-up in 3 months with repeat PSA.     Survivorship Information: In addition to standard surveillance procedures to detect cancer recurrence, Henry [name] should also be monitored for development of long term side effects from the radiation therapy. The more common long term side effects of radiation therapy for prostate cancer include (1) decreased sexual function, (2) change in bowel habits, and (3) increased frequency of urination. Other less common long term side effects may include (4) bleeding from the rectum or bladder, or (5) urethral stricture leading to inability to urinate. If there is concern that any of these side effects may have arisen as a result of the radiation, the Radiation Oncologist should be contacted to facilitate further assessment and management. Of note, if Henry [name] undergoes a colonoscopy that finds scarred or bleeding rectal tissue overlying the prostate, it is strongly discouraged to biopsy this area, as it may lead to formation of an ulcer which can take many months to heal. There is no problem having a biopsy or removing a polyp from any other part of the bowel tract.

## 2016-05-26 ENCOUNTER — Other Ambulatory Visit (HOSPITAL_COMMUNITY): Payer: Self-pay | Admitting: Radiation Oncology

## 2016-06-16 ENCOUNTER — Ambulatory Visit
Admission: RE | Admit: 2016-06-16 | Discharge: 2016-06-16 | Disposition: A | Discharge status: Home / Self Care | Payer: No Typology Code available for payment source | Source: Ambulatory Visit | Attending: Orthopaedic Surgery | Admitting: Orthopaedic Surgery

## 2016-06-16 ENCOUNTER — Encounter (HOSPITAL_BASED_OUTPATIENT_CLINIC_OR_DEPARTMENT_OTHER): Payer: Self-pay | Admitting: Orthopaedic Surgery

## 2016-06-16 ENCOUNTER — Ambulatory Visit (HOSPITAL_BASED_OUTPATIENT_CLINIC_OR_DEPARTMENT_OTHER): Payer: No Typology Code available for payment source | Admitting: Orthopaedic Surgery

## 2016-06-16 VITALS — BP 162/99 | HR 73 | Temp 97.3°F | Ht 69.76 in | Wt 300.9 lb

## 2016-06-16 DIAGNOSIS — Z923 Personal history of irradiation: Secondary | ICD-10-CM | POA: Insufficient documentation

## 2016-06-16 DIAGNOSIS — Z96641 Presence of right artificial hip joint: Secondary | ICD-10-CM | POA: Insufficient documentation

## 2016-06-16 DIAGNOSIS — M5090 Cervical disc disorder, unspecified, unspecified cervical region: Secondary | ICD-10-CM

## 2016-06-16 DIAGNOSIS — M79605 Pain in left leg: Secondary | ICD-10-CM

## 2016-06-16 DIAGNOSIS — R202 Paresthesia of skin: Secondary | ICD-10-CM | POA: Insufficient documentation

## 2016-06-16 DIAGNOSIS — M1612 Unilateral primary osteoarthritis, left hip: Principal | ICD-10-CM

## 2016-06-16 DIAGNOSIS — Z6841 Body Mass Index (BMI) 40.0 and over, adult: Secondary | ICD-10-CM | POA: Insufficient documentation

## 2016-06-16 DIAGNOSIS — C61 Malignant neoplasm of prostate: Secondary | ICD-10-CM | POA: Insufficient documentation

## 2016-06-16 DIAGNOSIS — G629 Polyneuropathy, unspecified: Secondary | ICD-10-CM | POA: Insufficient documentation

## 2016-06-16 DIAGNOSIS — M47812 Spondylosis without myelopathy or radiculopathy, cervical region: Secondary | ICD-10-CM | POA: Insufficient documentation

## 2016-06-16 NOTE — Progress Notes (Signed)
[facility]  Patient returns with left 'hip" pain  Left lateral hip and groin pain  Left foot feels cold and numb  Bilateral paresthesia hands and soles of feet  Recent radiation for prostate cancer  No chemotherapy    BP (!) 162/99  Pulse 73  Temp 36.3 C (97.3 F) (Thermal Scan)   Ht 1.772 m (5' 9.76")  Wt (!) 136.5 kg (300 lb 14.9 oz)  BMI 43.47 kg/m2  No pain with passive ROM left hip  Right total hip with painless ROM  T berg gait mild on the left  5/5 motor both feet   No hyper-reflexia both arms and legs    Imaging: Today's images of the bilateral hip reveals a well aligned well fixed right THA and moderate DJD left hip with JSN, sclerosis, and osteophytes.     Prior c spine films show severe cervical disc disease, spondylosis    Impression:  Moderate DJD left hip  Well functioning right THA   Morbid obesity Body mass index is 43.47 kg/(m^2).   Generalized peripheral neuropathy    Plan:  Weight loss  Eventual left THA  Follow up 6 months with repeat left hip series to see the trajectory of the hip  Referral to neurology for paresthesia   Peyton Bottoms MD  Assistant Professor Orthopaedic Surgery  [phone]

## 2016-06-30 ENCOUNTER — Ambulatory Visit: Payer: No Typology Code available for payment source | Attending: NEUROLOGY | Admitting: NEUROLOGY

## 2016-06-30 ENCOUNTER — Encounter (INDEPENDENT_AMBULATORY_CARE_PROVIDER_SITE_OTHER): Payer: Self-pay | Admitting: NEUROLOGY

## 2016-06-30 DIAGNOSIS — Z9089 Acquired absence of other organs: Secondary | ICD-10-CM | POA: Insufficient documentation

## 2016-06-30 DIAGNOSIS — Z9989 Dependence on other enabling machines and devices: Secondary | ICD-10-CM | POA: Insufficient documentation

## 2016-06-30 DIAGNOSIS — Z8546 Personal history of malignant neoplasm of prostate: Secondary | ICD-10-CM | POA: Insufficient documentation

## 2016-06-30 DIAGNOSIS — G4733 Obstructive sleep apnea (adult) (pediatric): Secondary | ICD-10-CM | POA: Insufficient documentation

## 2016-06-30 DIAGNOSIS — E611 Iron deficiency: Secondary | ICD-10-CM | POA: Insufficient documentation

## 2016-06-30 DIAGNOSIS — Z79899 Other long term (current) drug therapy: Secondary | ICD-10-CM | POA: Insufficient documentation

## 2016-06-30 DIAGNOSIS — Z923 Personal history of irradiation: Secondary | ICD-10-CM | POA: Insufficient documentation

## 2016-06-30 DIAGNOSIS — Z91013 Allergy to seafood: Secondary | ICD-10-CM | POA: Insufficient documentation

## 2016-06-30 DIAGNOSIS — K219 Gastro-esophageal reflux disease without esophagitis: Secondary | ICD-10-CM | POA: Insufficient documentation

## 2016-06-30 DIAGNOSIS — K589 Irritable bowel syndrome without diarrhea: Secondary | ICD-10-CM | POA: Insufficient documentation

## 2016-06-30 DIAGNOSIS — I10 Essential (primary) hypertension: Secondary | ICD-10-CM | POA: Insufficient documentation

## 2016-06-30 DIAGNOSIS — Z96641 Presence of right artificial hip joint: Secondary | ICD-10-CM | POA: Insufficient documentation

## 2016-06-30 DIAGNOSIS — Z882 Allergy status to sulfonamides status: Secondary | ICD-10-CM | POA: Insufficient documentation

## 2016-06-30 DIAGNOSIS — R202 Paresthesia of skin: Secondary | ICD-10-CM | POA: Insufficient documentation

## 2016-06-30 NOTE — H&P (Signed)
NAME:  Henry [name]  DOB:  04-29-1955  VISIT DATE:  06/30/2016    CC:    Chief Complaint   Patient presents with   . New Patient   . Numbness       Patient seen in consultation at the request of Dr. Radene Journey, MD  History obtained from the patient and chart/records  Age of patient:  61 y.o.    HPI:   Henry [name] is a 61 year old white male with a history of prostate cancer status post radiation, hyperlipidemia, GERD, IBS, low iron, hypertension and OSA on CPAP. He does have a history of a right total hip replacement, appendectomy, and right ankle fracture fixation. He presents today due to upper and lower extremity paresthesias. Of note the dysesthesias started during radiation which completed March 2018. He received 44 treatments of focal/beam radiation. His bilateral hands tingle as if they're going numb. He denies pain however the dysesthesias will wake him up in the middle of the night. He describes the dysesthesias is being low intensity however worse with activity and driving. The bilateral hand dysesthesias have been occurring for proximally 2 months duration. He does report pain approximately one day per week which is relieved by ibuprofen.    Reports intermittent numbness of the plantar surface of the feet particularly over the ball of the foot. The numbness occurs in the stands in one place for an extended period of time. This has been occurring for proximally 2 months duration.    He does have hip pain which is sharp and radiating down the left lateral side of the thigh which is accompanied by back pain. The hip pain is exacerbated by prolong standing, walking or sitting. This is been occurring for approximately 2 months duration. Of note, he has been to orthopaedics for left hip evaluation which did not warrant surgical intervention. Marland Kitchen     PMHx  Patient Active Problem List   Diagnosis   . Hyperlipemia   . Irritable bowel syndrome   . Hypercholesterolemia   . Osteoarthritis, knee   . Tear, meniscus    . Osteoarthritis of hip   . Normal colonoscopy 2008   . OSA on CPAP   . Sleep apnea   . CPAP (continuous positive airway pressure) dependence   . Hypertension   . GERD (gastroesophageal reflux disease)   . S/P right total hip arthroplasty   . Cervical disc disease   . Prostate cancer ([place])   . Clinical trial participant   . Paresthesia of upper and lower extremities of both sides   . Left leg pain     Past Surgical History:   Procedure Laterality Date   . HX ANKLE FRACTURE TX Right    . HX APPENDECTOMY     . 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 Caryl Comes   . PB COLONOSCOPY,DIAGNOSTIC       Family Medical History     Problem Relation (Age of Onset)    Asthma Father    COPD Father    Cancer Mother, Father    Coronary Artery Disease Mother    Diabetes Mother    High Cholesterol Mother    Hypertension Mother    Migraines Mother        Current Outpatient Prescriptions   Medication Sig Dispense Refill   . atorvastatin (LIPITOR) 20 mg Oral Tablet TAKE 1 TAB (20 MG TOTAL) BY MOUTH EVERY EVENING 90 Tab 3   . diclofenac sodium (VOLTAREN) 75  mg Oral Tablet, Delayed Release (E.C.) TAKE 1 TABLET (75 MG TOTAL) BY MOUTH TWICE DAILY 180 Tab 3   . dicyclomine (BENTYL) 20 mg Oral Tablet Take 1 Tab (20 mg total) by mouth Four times a day 360 Tab 3   . Ferrous Sulfate (SLOW RELEASE IRON) 142 mg (45 mg iron) Oral Tablet Sustained Release Take 1 Tab (142 mg total) by mouth Three times a day as needed 90 Tab 2   . GLUCOS CHOND CPLX ADVANCED ORAL qd     . metoprolol (LOPRESSOR) 25 mg Oral Tablet Take 1 Tab (25 mg total) by mouth Twice daily 180 Tab 3   . metroNIDAZOLE (METROGEL) 1 % Gel with Pump by Apply externally route Once a day     . multivitamin Oral Tablet Take 1 Tab by mouth Once a day     . Omeprazole 20 mg Oral Tablet, Delayed Release (E.C.) take  by mouth.       No current facility-administered medications for this visit.      Allergies   Allergen Reactions   . Sulfa (Sulfonamides) Rash   . Shellfish Containing Products  Itching and Swelling     Shrimp - eye swelling, itching     Social History     Social History   . Marital status: Married     Spouse name: N/A   . Number of children: N/A   . Years of education: N/A     Occupational History   .  [employer]     Social History Main Topics   . Smoking status: Never Smoker   . Smokeless tobacco: Never Used      Comment: very rare cigar   . Alcohol use 1.2 oz/week     2 Cans of beer per week      Comment: per week   . Drug use: No   . Sexual activity: Not on file     Other Topics Concern   . Abuse/Domestic Violence No   . Caffeine Concern No   . Calcium Intake Adequate Yes   . Computer Use Yes   . Drives Yes   . Exercise Concern No   . Seat Belt Yes   . Special Diet No   . Sunscreen Used Yes   . Right Hand Dominant Yes   . Routine Exercise No   . Ability To Walk 2 Flight Of Steps Without Sob/Cp Yes   . Ability To Do Own Adl's Yes   . Other Activity Level Yes     14 steps at home. walks a lot. shoveled snow this winter. limited by pain.     Social History Narrative     ROS  Constitutional - No fever, chills or weight changes.  HENT - Good dentition. No dentures.  No hearing loss.  No swollen nodes.  Eyes - No diplopia  CV - No chest pain.  Resp - No dyspnea  GI - No difficulty swallowing.  No diarrhea or constipation  GU - No urinary incontinence  MS - + OA of bilateral hips s/p right total hip replacement.  Skin - No rash  Psych - No memory problems, confusion, anxiety, or depression.  No suicidal ideations.  Neuro - See HPI.  Endo - No diabetes or thyroid problems  Heme - No anemia.  No easy bruising or bleeding.    EXAMINATION    BP 140/84  Pulse 67  Ht 1.753 m (5' 9" )  Wt 134.1 kg (295 lb 10.2  oz)  SpO2 96%  BMI 43.66 kg/m2    General/Appearance - Alert.  No acute distress.  Well kempt  HENT - Head atraumatic.  No corrective lenses.  No dentures  Fundi - Normal.  Discs are flat.  Cardiovascular - RRR.  Bilateral radial and pedal pulses 2+  Chest - Clear to auscultation.  Good  cough.    Mental Status:  Orientation - Alert. Oriented x3  Attention - Normal  Memory - registration 3/3, recall 3/3, objects after 5 min.  Knowledge - normal  Language - normal  Speech - Normal.    Cranial Nerves:   2: No visual defect to confrontation.  PERRLA   3,4,6:  EOMI, no nystagmus   5:  Facial sensation intact to touch and pinprick in V1-V3.   7:  Face symmetric,  No facial weakness.   8: Hearing intact.  Finger rubbing heard bilaterally.   9,10:  Palate movement symmetric, uvula midline   11:  Sternocleidomastoid and trapezius muscles have full bulk and strength   12:  Tongue midline.  No fasciculations.  Good strength. No atrophy.    Gait: - Normal.  Able to walk on toes, heels and in tandem    Coordination:  No tremor.  No ataxia or dysmetria on finger to nose or heel to shin.  RAM intact.  Romberg sign was negative.    Sensory - normal except as noted below   Pin: Diminished over left lateral thigh and lower lateral leg.    Vibration: mild-moderate deficit at the toes Position    Tone:  Normal    Motor - Normal strength (5/5) throughout except as noted:  Upper Ext Right  Left   Deltoid     Biceps     Triceps     Wrist ext     Wrist Flex     Finger ext     Finger flex     FDI     APB     ADM          Lower Ext     Gluteii     Iliopsoas     Quadriceps     Hamstrings     Ab/Adductors     Ant tibial     Gastrocnemii     Post tibial (inversion)     Peronei (eversion)     Hallux dorsiflexion     Hallux plantarflexion       Neck Flex    Neck Ext      Reflexes - 2+ throughout and no Babinski or Hoffman signs unless noted below:   RJ BJ TJ KJ AJ Plantars Hoffman's   Right    1+ 1+     Left    1+ 0     + bilateral Phalens, No Tinels at wrists    Myotonia - None  Fasciculations: None.     Personal review of images,  Tracings, specimens, outside records:    labs:  01-17-16: AST WNL  12-25-15: CBC diff, BMP WNL     Studies   MRI cervical spine (06-17-2003): there is a small disc herniation of the protrusion type into  the left central zone at C5 - 6. This intense the ventral thecal sac but does not cause cord compression. The neural foramen are patent bilaterally.    Independent interpretation:   MRI cervical spine (06-17-2003): no cord signal changes. No significant spinal canal or neuroforaminal stenosis.    Independent Interpretation:  None.  Assessment and Plan:    ICD-10-CM    1. Paresthesia of upper and lower extremities of both sides R20.2 Neurology Consult - [facility]     NCS/EMG     Henry [name] is a 61 year old white male with a history of prostate cancer status post radiation, hyperlipidemia, GERD, IBS, low iron, hypertension and OSA on CPAP. He does have a history of a right total hip replacement, appendectomy, and right ankle fracture fixation. He presents today due to upper and lower extremity paresthesias.     - Recommend NCS/EMG of bilateral upper extremities and the legs to assess for possible carpal tunnel syndrome, left cervical radiculopathy and left lumbosacral radiculopathy versus neuropathy.  - Follow-up pending NCS/EMG        Continue home meds except as noted above.  Total face-to-face time by staff:  60 minutes.  Total counseling/coordination of care time by staff:  32 minutes regarding:  Issues noted above plus review/discussion of prior MRI, NCS/EMG procedure.     Leigh Aurora, MD, PhD  06/30/2016, 09:13  Assistant Professor of Neurology  Neuromuscular Medicine

## 2016-07-07 ENCOUNTER — Ambulatory Visit
Admission: RE | Admit: 2016-07-07 | Discharge: 2016-07-07 | Disposition: A | Discharge status: Home / Self Care | Payer: No Typology Code available for payment source | Source: Ambulatory Visit | Attending: NEUROLOGY | Admitting: NEUROLOGY

## 2016-07-07 DIAGNOSIS — R202 Paresthesia of skin: Principal | ICD-10-CM | POA: Insufficient documentation

## 2016-07-14 DIAGNOSIS — R45851 Suicidal ideations: Secondary | ICD-10-CM | POA: Diagnosis not present

## 2016-07-14 DIAGNOSIS — G894 Chronic pain syndrome: Secondary | ICD-10-CM | POA: Diagnosis not present

## 2016-07-17 DIAGNOSIS — L98491 Non-pressure chronic ulcer of skin of other sites limited to breakdown of skin: Secondary | ICD-10-CM | POA: Diagnosis not present

## 2016-07-21 ENCOUNTER — Ambulatory Visit (INDEPENDENT_AMBULATORY_CARE_PROVIDER_SITE_OTHER): Payer: No Typology Code available for payment source | Admitting: NEUROLOGY

## 2016-07-29 DIAGNOSIS — R109 Unspecified abdominal pain: Secondary | ICD-10-CM | POA: Diagnosis not present

## 2016-08-06 ENCOUNTER — Ambulatory Visit (HOSPITAL_COMMUNITY): Payer: No Typology Code available for payment source | Admitting: Radiation Oncology

## 2016-08-13 ENCOUNTER — Inpatient Hospital Stay
Admission: RE | Admit: 2016-08-13 | Discharge: 2016-08-13 | Disposition: A | Discharge status: Still In Hospital | Payer: No Typology Code available for payment source | Source: Ambulatory Visit | Attending: Radiation Oncology | Admitting: Radiation Oncology

## 2016-08-13 ENCOUNTER — Other Ambulatory Visit (HOSPITAL_COMMUNITY): Payer: Self-pay | Admitting: Radiation Oncology

## 2016-08-13 ENCOUNTER — Encounter (HOSPITAL_COMMUNITY): Payer: Self-pay | Admitting: Radiation Oncology

## 2016-08-13 VITALS — BP 151/85 | HR 65 | Temp 98.0°F | Wt 296.3 lb

## 2016-08-13 DIAGNOSIS — Z08 Encounter for follow-up examination after completed treatment for malignant neoplasm: Secondary | ICD-10-CM | POA: Insufficient documentation

## 2016-08-13 DIAGNOSIS — M479 Spondylosis, unspecified: Secondary | ICD-10-CM | POA: Insufficient documentation

## 2016-08-13 DIAGNOSIS — Z882 Allergy status to sulfonamides status: Secondary | ICD-10-CM | POA: Insufficient documentation

## 2016-08-13 DIAGNOSIS — C61 Malignant neoplasm of prostate: Secondary | ICD-10-CM

## 2016-08-13 DIAGNOSIS — Z79899 Other long term (current) drug therapy: Secondary | ICD-10-CM | POA: Insufficient documentation

## 2016-08-13 DIAGNOSIS — Z7189 Other specified counseling: Secondary | ICD-10-CM | POA: Insufficient documentation

## 2016-08-13 DIAGNOSIS — Z91013 Allergy to seafood: Secondary | ICD-10-CM | POA: Insufficient documentation

## 2016-08-13 DIAGNOSIS — G5600 Carpal tunnel syndrome, unspecified upper limb: Secondary | ICD-10-CM | POA: Insufficient documentation

## 2016-08-13 DIAGNOSIS — R911 Solitary pulmonary nodule: Secondary | ICD-10-CM | POA: Insufficient documentation

## 2016-08-13 LAB — PSA, DIAGNOSTIC: PSA: 0.2 ng/mL (ref <=4.0)

## 2016-08-13 NOTE — Cancer Center Note (Addendum)
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 Since treatment his frequency and obstructive urinary symptoms have resolved. Bowel movements are also back to normal. His PSA is 0.2 today. He has some numbness in his hands thought to be due to carpal tunnel syndrome and his feet though to be due to arthritis in his back. He denies any other symptoms.       Medications:  Current Outpatient Prescriptions   Medication Sig   . atorvastatin (LIPITOR) 20 mg Oral Tablet TAKE 1 TAB (20 MG TOTAL) BY MOUTH EVERY EVENING   . diclofenac sodium (VOLTAREN) 75 mg Oral Tablet, Delayed Release (E.C.) TAKE 1 TABLET (75 MG TOTAL) BY MOUTH TWICE DAILY   . dicyclomine (BENTYL) 20 mg Oral Tablet Take 1 Tab (20 mg total) by mouth Four times a day   . Ferrous Sulfate (SLOW RELEASE IRON) 142 mg (45 mg iron) Oral Tablet Sustained Release Take 1 Tab (142 mg total) by mouth Three times a day as needed   . GLUCOS CHOND CPLX ADVANCED ORAL qd   . metoprolol (LOPRESSOR) 25 mg Oral Tablet Take 1 Tab (25 mg total) by mouth Twice daily   . metroNIDAZOLE (METROGEL) 1 % Gel with Pump by Apply externally route Once a day   . multivitamin Oral Tablet Take 1 Tab by mouth Once a day   . Omeprazole 20 mg Oral Tablet, Delayed Release (E.C.) take  by mouth.       Allergies   Allergen Reactions   . Sulfa (Sulfonamides) Rash   . Shellfish Containing Products Itching and Swelling     Shrimp - eye swelling, itching       Review of  Systems:  Constitutional:  negative for fevers, chills, anorexia, fatigue and weight loss  Eyes:  negative for visual disturbance and irritation  Ears, nose, mouth, throat:  negative for hearing loss, nasal congestion, sore throat, hoarseness and voice change  Cardiac/vascular:  negative for chest pain, palpitations and lower extremity edema  Respiratory:  negative for hemoptysis, pleurisy/chest pain, cough and dyspnea  Gastrointestinal:  negative for dysphagia, nausea, vomiting, melena, diarrhea and abdominal pain  Genitourinary:  negative for dysuria, urinary incontinence and hematuria  Musculoskeletal:  negative for myalgias, muscle weakness, neck pain, back pain and hip pain   Neurological:  negative for seizures, speech problems, gait problems, dizziness and headaches  Behavioral/Psych:  negative for behavior problems  Hematologic/lymphatic:  negative for bleeding and lymphadenopathy  Integumentary (skin, breast):  negative for rash, skin lesion(s), pruritus and dryness  Endocrine:  negative  Allergic/Immunologic:  negative      Objective:   Temperature: 36.7 C (98 F)  Heart Rate: 65  BP (Non-Invasive): (!) 151/85  SpO2-1: 97 %  Pain Score (Numeric, Faces): 0  Weight: 134.4 kg (296 lb 4.8 oz)    CONSTITUTIONAL: No acute distress, ECOG (0) Fully active, able to carry on all predisease performance without  restriction  EYES: conjunctiva clear, pupils equal round and reactive to light  ENT: Normocephalic, atraumatic, moist oral cavity mucous membranes  NECK: No masses or palpable lymphadenopathy  RESPIRATORY: Clear to auscultation bilaterally  CARDIOVASCULAR:Regular rate and rhythm, no murmurs appreciated.  GASTROINTESTINAL: Soft, non-tender, non-distended, no palpable hepatosplenomegaly or masses.    GENITOURINARY:  No palpable prostate nodularity  MUSCULOSKELETAL:  No tenderness to palpation in joints or spine.    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 There is no clinical or biochemical evidence of recurrence. He will follow-up in 3 months with repeat PSA. Of note, I ordered a CT chest to follow-up the prior lung nodule seen at the time of initial staging. I personally saw and examined the patient, and reviewed all recent findings with him.  I spent greater than 50% of a 15 minute visit in discussion of the patient's diagnosis and management.    Rexanne Mano. Lenore Manner, MD 08/13/2016, 11:35

## 2016-08-13 NOTE — Progress Notes (Signed)
RTOG 1102 - Pt was in for 3 month appt following completion of radiation therapy and is doing well.  AEs were reviewed with patient and urination has gone back to baseline and the flow is now back to normal.  Denies pain during urination.  Pt no longer has diarrhea, except occasionally from eating something he shouldn't.  Energy levels have returned to baseline levels as well.  Him and his wife will be moving to a new house soon and he has been busy in preparation for the move.  He does complain of paresthesias in both hands and both feet.  He states that this occurred right at the end of radiation therapy and was not reported to [place] at that time.  He saw neurology for this in May of this year and after work up, they attributed the hand tingling/numbness to carpal tunnel.  He has been doing exercises for this and it is feeling much better.  He has been told that his feet paresthesias may be from arthritis in his back.  He will continue follow up with neurology for both issues.  He is also being evaluated and treated by ortho for some Lt. Hip pain that is attributed to a sciatic issue.  His last dose of Casodex was 06-25-2016 as instructed.  He was hoping the hot flashes would stop, but he is still having hot flashes.      He denies any changes in conmeds.  Dr. Lenore Manner asked that a PSA be done today, although not required by protocol.  PSA result 0.2.  Pt notified of results and that it would be repeated at his next appt.   Pt to return for 6 month follow up in 3 months.  Appt to be scheduled.  Lorn Junes, RN

## 2016-08-25 ENCOUNTER — Ambulatory Visit: Payer: No Typology Code available for payment source | Attending: Family Medicine

## 2016-08-25 ENCOUNTER — Other Ambulatory Visit (HOSPITAL_BASED_OUTPATIENT_CLINIC_OR_DEPARTMENT_OTHER): Payer: Self-pay | Admitting: Family Medicine

## 2016-08-25 DIAGNOSIS — E7800 Pure hypercholesterolemia, unspecified: Secondary | ICD-10-CM | POA: Insufficient documentation

## 2016-08-25 LAB — LIPID PANEL
CHOL/HDL RATIO: 4.1
CHOLESTEROL: 157 mg/dL (ref <200)
HDL CHOL: 38 mg/dL — ABNORMAL LOW (ref >=39)
LDL CALC: 80 mg/dL (ref <=100)
NON-HDL: 119 mg/dL (ref <=190)
TRIGLYCERIDES: 195 mg/dL — ABNORMAL HIGH (ref <=150)
VLDL CALC: 39 mg/dL — ABNORMAL HIGH (ref <30)

## 2016-08-25 MED ORDER — DICLOFENAC SODIUM 75 MG TABLET,DELAYED RELEASE
75.0000 mg | DELAYED_RELEASE_TABLET | Freq: Two times a day (BID) | ORAL | 3 refills | Status: DC
Start: 2016-08-25 — End: 2017-07-05

## 2016-08-27 ENCOUNTER — Ambulatory Visit: Payer: No Typology Code available for payment source | Attending: Family Medicine | Admitting: Family Medicine

## 2016-08-27 VITALS — BP 134/82 | HR 78 | Temp 97.3°F | Ht 69.0 in | Wt 293.2 lb

## 2016-08-27 DIAGNOSIS — I10 Essential (primary) hypertension: Principal | ICD-10-CM | POA: Insufficient documentation

## 2016-08-27 DIAGNOSIS — Z23 Encounter for immunization: Secondary | ICD-10-CM | POA: Insufficient documentation

## 2016-08-27 DIAGNOSIS — D649 Anemia, unspecified: Secondary | ICD-10-CM | POA: Insufficient documentation

## 2016-08-27 DIAGNOSIS — E785 Hyperlipidemia, unspecified: Secondary | ICD-10-CM | POA: Insufficient documentation

## 2016-08-27 DIAGNOSIS — K219 Gastro-esophageal reflux disease without esophagitis: Secondary | ICD-10-CM | POA: Insufficient documentation

## 2016-08-27 DIAGNOSIS — L989 Disorder of the skin and subcutaneous tissue, unspecified: Secondary | ICD-10-CM | POA: Insufficient documentation

## 2016-08-27 DIAGNOSIS — K589 Irritable bowel syndrome without diarrhea: Secondary | ICD-10-CM | POA: Insufficient documentation

## 2016-08-27 DIAGNOSIS — Z79899 Other long term (current) drug therapy: Secondary | ICD-10-CM | POA: Insufficient documentation

## 2016-08-27 DIAGNOSIS — Z96641 Presence of right artificial hip joint: Secondary | ICD-10-CM | POA: Insufficient documentation

## 2016-08-27 DIAGNOSIS — Z8546 Personal history of malignant neoplasm of prostate: Secondary | ICD-10-CM | POA: Insufficient documentation

## 2016-08-27 DIAGNOSIS — G4730 Sleep apnea, unspecified: Secondary | ICD-10-CM | POA: Insufficient documentation

## 2016-08-27 DIAGNOSIS — Z1159 Encounter for screening for other viral diseases: Secondary | ICD-10-CM | POA: Insufficient documentation

## 2016-08-27 NOTE — Progress Notes (Signed)
Subjective:     Patient ID:  Henry [name] is an 61 y.o. male   Chief Complaint:    Chief Complaint   Patient presents with   . Hypertension       HPI Comments: Elih Mooney is a 61 y.o.  Presents today following up for chronic medical conditions.  Patient has a history of hypertension, sleep apnea, reflux and irritable bowel disease, prostate cancer survivor, status post right hip arthroplasty.  He has had symptoms of numbness and tingling in the extremities thought to be due to combination of carpal tunnel and medication side effect.  He works out regularly at [employer] 3 times per week.  He follows with Dermatology for changing skin lesions.      ALLERGIES:   -- Sulfa (Sulfonamides) -- Rash   -- Shellfish Containing Products -- Itching and Swelling    --  Shrimp - eye swelling, itching    Current Outpatient Prescriptions:  atorvastatin (LIPITOR) 20 mg Oral Tablet, TAKE 1 TAB (20 MG TOTAL) BY MOUTH EVERY EVENING  diclofenac sodium (VOLTAREN) 75 mg Oral Tablet, Delayed Release (E.C.), Take 1 Tab (75 mg total) by mouth Twice daily  dicyclomine (BENTYL) 20 mg Oral Tablet, Take 1 Tab (20 mg total) by mouth Four times a day  Ferrous Sulfate (SLOW RELEASE IRON) 142 mg (45 mg iron) Oral Tablet Sustained Release, Take 1 Tab (142 mg total) by mouth Three times a day as needed  GLUCOS CHOND CPLX ADVANCED ORAL, qd  metoprolol (LOPRESSOR) 25 mg Oral Tablet, Take 1 Tab (25 mg total) by mouth Twice daily  metroNIDAZOLE (METROGEL) 1 % Gel with Pump, by Apply externally route Once a day  multivitamin Oral Tablet, Take 1 Tab by mouth Once a day  Omeprazole 20 mg Oral Tablet, Delayed Release (E.C.), take  by mouth.          Patient is a 61 y.o. male presenting with hypertension.   Hypertension         Review of Systems   Constitutional: Negative.    Respiratory: Negative.    Cardiovascular: Negative.    Gastrointestinal: Positive for constipation and diarrhea. Negative for abdominal pain.   Endocrine: Negative.     Musculoskeletal: Positive for arthralgias.   Skin: Positive for color change.   Neurological: Positive for numbness.   Psychiatric/Behavioral: Negative.      Objective:   Physical Exam   Constitutional: He is oriented to person, place, and time.   BP 134/82  Pulse 78  Temp 36.3 C (97.3 F)  Ht 1.753 m (5' 9" )  Wt 133 kg (293 lb 3.4 oz)  SpO2 98%  BMI 43.3 kg/m2     HENT:   Mouth/Throat: Oropharynx is clear and moist.   Neck: Neck supple.   Cardiovascular: Normal rate and regular rhythm.    No murmur heard.  Pulmonary/Chest: Effort normal. He has no wheezes.   Abdominal: Bowel sounds are normal. He exhibits no distension and no mass. There is no tenderness. There is no rebound and no guarding.   Musculoskeletal: He exhibits no edema.   Neurological: He is alert and oriented to person, place, and time. He has normal reflexes.   Skin: Skin is warm and dry.   Psychiatric: He has a normal mood and affect. His behavior is normal. Thought content normal.     Ortho Exam    Assessment & Plan:     (I10) Hypertension  (primary encounter diagnosis)  Plan: BASIC METABOLIC PANEL, FASTING, CBC  Blood pressure today acceptable he will continue current care    (K21.9) Gastroesophageal reflux disease, esophagitis presence not specified  Plan:  Reflux symptoms under control    (E78.5) Hyperlipidemia, unspecified hyperlipidemia type  Plan:   Lab Results   Component Value Date    CHOLESTEROL 157 08/25/2016    CHOLESTEROL 159 05/28/2011    HDLCHOL 38 (L) 08/25/2016    HDLCHOL 40 05/28/2011    LDLCHOL 80 08/25/2016    LDLCHOL 87 05/28/2011    LDLCHOLDIR 65 07/04/2013    TRIG 195 (H) 08/25/2016    TRIG 161 (H) 05/28/2011          (Z11.59) Need for hepatitis C screening test  Plan: HEPATITIS C ANTIBODY SCREEN WITH REFLEX TO HCV         PCR            (D64.9) Anemia, unspecified type  Plan: IRON            (Z23) Need for shingles vaccine  Plan: [place] (ADMIN), [place] ([place])            He will continue to follow up for his other chronic medical conditions with his specialists and keep me posted.

## 2016-09-04 ENCOUNTER — Other Ambulatory Visit (HOSPITAL_COMMUNITY): Payer: Self-pay

## 2016-09-10 ENCOUNTER — Ambulatory Visit (HOSPITAL_BASED_OUTPATIENT_CLINIC_OR_DEPARTMENT_OTHER): Payer: No Typology Code available for payment source

## 2016-09-10 ENCOUNTER — Ambulatory Visit
Admission: RE | Admit: 2016-09-10 | Discharge: 2016-09-10 | Disposition: A | Discharge status: Home / Self Care | Payer: No Typology Code available for payment source | Source: Ambulatory Visit | Attending: Radiation Oncology | Admitting: Radiation Oncology

## 2016-09-10 DIAGNOSIS — I10 Essential (primary) hypertension: Secondary | ICD-10-CM

## 2016-09-10 DIAGNOSIS — C61 Malignant neoplasm of prostate: Secondary | ICD-10-CM | POA: Insufficient documentation

## 2016-09-10 DIAGNOSIS — R918 Other nonspecific abnormal finding of lung field: Secondary | ICD-10-CM

## 2016-09-10 DIAGNOSIS — I2510 Atherosclerotic heart disease of native coronary artery without angina pectoris: Secondary | ICD-10-CM

## 2016-09-10 DIAGNOSIS — Z1159 Encounter for screening for other viral diseases: Secondary | ICD-10-CM

## 2016-09-10 DIAGNOSIS — D649 Anemia, unspecified: Secondary | ICD-10-CM

## 2016-09-10 LAB — CBC
HCT: 39.6 % (ref 36.7–47.0)
HCT: 39.6 % (ref 36.7–47.0)
HGB: 13.6 g/dL (ref 12.5–16.3)
MCH: 28.8 pg (ref 27.4–33.0)
MCHC: 34.4 g/dL (ref 32.5–35.8)
MCV: 83.9 fL (ref 78.0–100.0)
MCV: 83.9 fL (ref 78.0–100.0)
MPV: 6.9 fL — ABNORMAL LOW (ref 7.5–11.5)
PLATELETS: 232 x10ˆ3/uL (ref 140–450)
RBC: 4.72 x10ˆ6/uL (ref 4.06–5.63)
RDW: 13.9 % (ref 12.0–15.0)
WBC: 4.7 x10ˆ3/uL (ref 3.5–11.0)

## 2016-09-10 LAB — BASIC METABOLIC PANEL, FASTING
ANION GAP: 7 mmol/L (ref 4–13)
BUN/CREA RATIO: 20 (ref 6–22)
BUN: 16 mg/dL (ref 8–25)
CALCIUM: 9.7 mg/dL (ref 8.5–10.2)
CALCIUM: 9.7 mg/dL (ref 8.5–10.2)
CHLORIDE: 105 mmol/L (ref 96–111)
CO2 TOTAL: 28 mmol/L (ref 22–32)
CO2 TOTAL: 28 mmol/L (ref 22–32)
CREATININE: 0.82 mg/dL (ref 0.62–1.27)
ESTIMATED GFR: >59 mL/min/1.73mˆ2 (ref >59)
GLUCOSE: 116 mg/dL — ABNORMAL HIGH (ref 70–105)
POTASSIUM: 4.3 mmol/L (ref 3.5–5.1)
SODIUM: 140 mmol/L (ref 136–145)

## 2016-09-10 LAB — HEPATITIS C ANTIBODY SCREEN WITH REFLEX TO HCV PCR
HCV ANTIBODY QUALITATIVE: NEGATIVE
HCV ANTIBODY QUALITATIVE: NEGATIVE

## 2016-09-10 LAB — IRON: IRON: 84 ug/dL (ref 55–175)

## 2016-09-15 ENCOUNTER — Other Ambulatory Visit (HOSPITAL_BASED_OUTPATIENT_CLINIC_OR_DEPARTMENT_OTHER): Payer: Self-pay | Admitting: Family Medicine

## 2016-09-15 MED ORDER — DICYCLOMINE 20 MG TABLET
20.0000 mg | ORAL_TABLET | Freq: Four times a day (QID) | ORAL | 3 refills | Status: DC
Start: 2016-09-15 — End: 2016-09-16

## 2016-09-16 ENCOUNTER — Other Ambulatory Visit (HOSPITAL_BASED_OUTPATIENT_CLINIC_OR_DEPARTMENT_OTHER): Payer: Self-pay | Admitting: Family Medicine

## 2016-09-17 MED ORDER — DICYCLOMINE 20 MG TABLET
20.0000 mg | ORAL_TABLET | Freq: Four times a day (QID) | ORAL | 3 refills | Status: DC
Start: 2016-09-17 — End: 2017-09-18

## 2016-09-22 ENCOUNTER — Ambulatory Visit (HOSPITAL_BASED_OUTPATIENT_CLINIC_OR_DEPARTMENT_OTHER): Payer: No Typology Code available for payment source | Admitting: NURSE PRACTITIONER

## 2016-09-22 VITALS — BP 134/82 | HR 88 | Temp 97.0°F | Resp 14 | Ht 69.0 in | Wt 296.3 lb

## 2016-09-22 DIAGNOSIS — G4733 Obstructive sleep apnea (adult) (pediatric): Secondary | ICD-10-CM

## 2016-09-22 NOTE — Progress Notes (Signed)
[facility] [place]    Patient Name: Henry [name] [facility]  MRN#: [id number]  DOB: 1955/10/16  Date of Service: 09/22/2016    CC: Sleep Apnea        Last Clinic Visit: 09/25/15    Sleep Disorders Therapy:  Positive Pressure: CPAP 9 cm     Mask: Zest Nasal     Humidifier: Heated    Ramp: Yes    Oxyen: No       HPI: The patient is a 61 year old male with history of severe sleep apnea presenting for routine follow up.No concerns or complaints. Continues to do well on therapy.     Apnea Hypopnea index: 53.8 Desat 63%(01/23/10)    Current use of positive pressure therapy: every night    Hours of sleep per night: approx 5 hr to 6 hours a night     Problems with Therapy    Mask: None     Machine: None    Nasal: None    Epworth Sleepiness Scale:7    Sleep related symptoms:  Excessive day time sleepiness: Denies   Nonrestorative sleep: Denies  Witnessed apneas by bed partner: Denies   Awakening with choking: Denies   Nocturnal restlessness: Denies  Insomnia with frequent awakenings: Denies   Lack of concentration: Denies   Cognitive deficits: Denies   Changes in mood: Denies   Morning headaches: Denies   Vivid, strange, or threatening dreams Denies    Sleep structure:  - sleep latency 5 min, sleep maintenance 5 hours, Frequent night time awakening none  - excessive caffeine, soda, alcohol intake: No. Decaffeinated only       Associated diseases: Obesity (Body mass index is 43.76 kg/(m^2).), GERD  Systemic complications:   Hypertension,   Cardiovascular disease,        PAP compliance report:(06/24/16-09/21/16)  DME: [facility]  CPAP  Setting: 9 cm of H2O  % of sleep >=4 hours use: 80%  Average usage (Days Used): 4 hours 47 mins 18 secs  Avg air leak time: 0 secs  Residual AHI: 0.7    PAST MEDICAL HISTORY:   Past Medical History:   Diagnosis Date   . Cancer (CMS [facility]) 11/2015    prostate cancer   . CPAP (continuous positive airway pressure) dependence     settings unknown   . GERD  (gastroesophageal reflux disease)     well controlled on omeprazole   . Heartburn    . HTN    . Hyperlipidemia    . Hypertension    . Irritable bowel syndrome    . Osteoarthritis of back    . Prostate cancer (CMS [place]) 12/11/2015   . Sleep apnea    . Wears glasses            ALLERGIES:   Allergies   Allergen Reactions   . Sulfa (Sulfonamides) Rash   . Shellfish Containing Products Itching and Swelling     Shrimp - eye swelling, itching         CURRENT MEDICATIONS:   Prior to Admission medications    Medication Sig Start Date End Date Taking? Authorizing Provider   atorvastatin (LIPITOR) 20 mg Oral Tablet TAKE 1 TAB (20 MG TOTAL) BY MOUTH EVERY EVENING 10/18/15   Rosemarie Beath, MD   diclofenac sodium (VOLTAREN) 75 mg Oral Tablet, Delayed Release (E.C.) Take 1 Tab (75 mg total) by mouth Twice daily 08/25/16   Rosemarie Beath, MD   dicyclomine (BENTYL) 20 mg Oral Tablet  Take 1 Tab (20 mg total) by mouth Four times a day 09/17/16   Rosemarie Beath, MD   Ferrous Sulfate (SLOW RELEASE IRON) 142 mg (45 mg iron) Oral Tablet Sustained Release Take 1 Tab (142 mg total) by mouth Three times a day as needed 04/26/14   Rosemarie Beath, MD   GLUCOS CHOND CPLX ADVANCED ORAL qd    Provider, Historical   metoprolol (LOPRESSOR) 25 mg Oral Tablet Take 1 Tab (25 mg total) by mouth Twice daily 12/10/15   Rosemarie Beath, MD   metroNIDAZOLE (METROGEL) 1 % Gel with Pump by Apply externally route Once a day    Provider, Historical   multivitamin Oral Tablet Take 1 Tab by mouth Once a day    Provider, Historical   Omeprazole 20 mg Oral Tablet, Delayed Release (E.C.) take  by mouth.    Provider, Historical       REVIEW OF SYSTEMS:   Constitutional: negative  Eyes: negative  Ears, nose, mouth, throat, and face: negative  Respiratory: obstructive sleep apnea  Cardiovascular: negative  Gastrointestinal: negative  Genitourinary:negative  Integument/breast: negative  Hematologic/lymphatic: negative  Musculoskeletal:negative  Neurological: negative   Behavioral/Psych: negative  Endocrine: negative  Allergic/Immunologic: negative          PHYSICAL EXAM: The patient's current vitals are BP 134/82  Pulse 88  Temp 36.1 C (97 F) (Thermal Scan)   Resp 14  Ht 1.753 m (5' 9" )  Wt 134.4 kg (296 lb 4.8 oz)  SpO2 96%  BMI 43.76 kg/m2.  His     General: NAD, good appearance   Eyes: Conjunctiva clear, EOMI.   Oropharynx: Oropharynx Mallampati Class I  Neck: 19Inches @ cirsothyroid membrane and neck supple  Lungs: clear to auscultation bilaterally  Cardiovascular: S1, S2, RRR without murmur  Abdomen: soft, non-tender, bowel sounds normal and non-tender   Extremities: no cyanosis, clubbing or edema  Skin: Skin warm and dry  Neurologic: grossly normal, AO x 3  Lymphatics: No nodes appreciated      ASSESSMENT/ PLAN:   [name] is a 61 y.o. Male with hx of  1. Obstructive sleep apnea-   *Good response and benefit of therapy  *Apnea well controlled.   *Continue to do well on positive airway pressure therapy and excellent subjective and objective compliance.  *Advise obtain replacement equipment every three to six months.   *Advised patient do not drive or operate heavy equipment or machinery if fatigued, drowsy or sleepy due to risk of injury.   2. Morbid obesity- fitness and healthy eating habits encouraged  3. Hypertension      RTC: one year     Pt was seen independently and cosigning physician available for consultation.     Lorette Ang, APRN, FNP-BC   09/22/2016, 13:08        Gerrie Nordmann. Wilson-Carr, APRN, FNP-BC  Family Nurse Practitioner  [facility]  Pulmonary and Sleep Medicine.  Department of Medicine       Pt was seen independently by APRN. I was available for consultation.     Weldon Picking, MD  09/22/2016, 13:15

## 2016-09-23 ENCOUNTER — Encounter (HOSPITAL_BASED_OUTPATIENT_CLINIC_OR_DEPARTMENT_OTHER): Payer: No Typology Code available for payment source | Admitting: NURSE PRACTITIONER

## 2016-09-23 ENCOUNTER — Encounter (HOSPITAL_BASED_OUTPATIENT_CLINIC_OR_DEPARTMENT_OTHER): Payer: Self-pay | Admitting: NURSE PRACTITIONER

## 2016-09-23 NOTE — Progress Notes (Signed)
Tech sent script to DME: [facility] for supplies    CPAP 9 cmH20  Heated humidification  ZEST NASAL MASK  Headgear   Filters  Tubing  Water chamber.... 09/23/16 TMS RRT RPSGT

## 2016-10-08 ENCOUNTER — Other Ambulatory Visit (HOSPITAL_BASED_OUTPATIENT_CLINIC_OR_DEPARTMENT_OTHER): Payer: Self-pay | Admitting: Family Medicine

## 2016-10-12 NOTE — Telephone Encounter (Signed)
Regarding: refill request  ----- Message from Lajoyce Corners sent at 10/12/2016  9:14 AM EDT -----  Rosemarie Beath, MD    Pharmacy calling to request a refill on the following medication. Thank you.    atorvastatin (LIPITOR) 20 mg Oral Tablet 90 Tab 3 10/18/2015     Sig: TAKE 1 TAB (20 MG TOTAL) BY MOUTH EVERY [facility]    Preferred Pharmacy     [id number] [place] [place]   [address] [address]   Phone: [phone]: [phone]   Not a 24 hour pharmacy; exact hours not known

## 2016-11-17 ENCOUNTER — Ambulatory Visit
Admission: RE | Admit: 2016-11-17 | Discharge: 2016-11-17 | Disposition: A | Discharge status: Home / Self Care | Payer: No Typology Code available for payment source | Source: Ambulatory Visit | Attending: Radiation Oncology | Admitting: Radiation Oncology

## 2016-11-17 ENCOUNTER — Encounter (HOSPITAL_COMMUNITY): Payer: Self-pay | Admitting: Radiation Oncology

## 2016-11-17 ENCOUNTER — Other Ambulatory Visit (HOSPITAL_COMMUNITY): Payer: Self-pay | Admitting: Radiation Oncology

## 2016-11-17 DIAGNOSIS — E785 Hyperlipidemia, unspecified: Secondary | ICD-10-CM | POA: Insufficient documentation

## 2016-11-17 DIAGNOSIS — Z809 Family history of malignant neoplasm, unspecified: Secondary | ICD-10-CM | POA: Insufficient documentation

## 2016-11-17 DIAGNOSIS — Z9989 Dependence on other enabling machines and devices: Secondary | ICD-10-CM | POA: Insufficient documentation

## 2016-11-17 DIAGNOSIS — C61 Malignant neoplasm of prostate: Secondary | ICD-10-CM | POA: Insufficient documentation

## 2016-11-17 DIAGNOSIS — K219 Gastro-esophageal reflux disease without esophagitis: Secondary | ICD-10-CM | POA: Insufficient documentation

## 2016-11-17 DIAGNOSIS — Z91013 Allergy to seafood: Secondary | ICD-10-CM | POA: Insufficient documentation

## 2016-11-17 DIAGNOSIS — I10 Essential (primary) hypertension: Secondary | ICD-10-CM | POA: Insufficient documentation

## 2016-11-17 DIAGNOSIS — M479 Spondylosis, unspecified: Secondary | ICD-10-CM | POA: Insufficient documentation

## 2016-11-17 DIAGNOSIS — Z96641 Presence of right artificial hip joint: Secondary | ICD-10-CM | POA: Insufficient documentation

## 2016-11-17 DIAGNOSIS — Z006 Encounter for examination for normal comparison and control in clinical research program: Secondary | ICD-10-CM

## 2016-11-17 DIAGNOSIS — Z79899 Other long term (current) drug therapy: Secondary | ICD-10-CM | POA: Insufficient documentation

## 2016-11-17 DIAGNOSIS — K589 Irritable bowel syndrome without diarrhea: Secondary | ICD-10-CM | POA: Insufficient documentation

## 2016-11-17 DIAGNOSIS — Z923 Personal history of irradiation: Secondary | ICD-10-CM | POA: Insufficient documentation

## 2016-11-17 DIAGNOSIS — Z08 Encounter for follow-up examination after completed treatment for malignant neoplasm: Secondary | ICD-10-CM | POA: Insufficient documentation

## 2016-11-17 DIAGNOSIS — Z882 Allergy status to sulfonamides status: Secondary | ICD-10-CM | POA: Insufficient documentation

## 2016-11-17 LAB — PSA, DIAGNOSTIC: PSA: 0.2 ng/mL (ref <=4.0)

## 2016-11-17 NOTE — Cancer Center Note (Signed)
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 Since treatment his frequency and obstructive urinary symptoms have resolved. Bowel movements are also back to normal. His PSA is 0.2 today, which is stable from the last visit. He has some numbness in his hands thought to be due to carpal tunnel syndrome and his feet though to be due to arthritis in his back. He denies any other symptoms.     Implantable Cardiac Device:   none    Past Medical/Surgical History:  Past Medical History:   Diagnosis Date   . Cancer (CMS [facility]) 11/2015    prostate cancer   . CPAP (continuous positive airway pressure) dependence     settings unknown   . GERD (gastroesophageal reflux disease)     well controlled on omeprazole   . Heartburn    . HTN    . Hyperlipidemia    . Hypertension    . Irritable bowel syndrome    . Osteoarthritis of back    . Prostate cancer (CMS [place]) 12/11/2015   . Sleep apnea    . Wears glasses          Past Surgical History:   Procedure Laterality Date   . HX ANKLE FRACTURE TX Right    . HX APPENDECTOMY     . 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 Caryl Comes   . PB COLONOSCOPY,DIAGNOSTIC             Medication History:  Current Outpatient Prescriptions   Medication Sig   . atorvastatin (LIPITOR) 20 mg Oral Tablet Take 1 Tab (20 mg total) by mouth Every evening   . diclofenac  sodium (VOLTAREN) 75 mg Oral Tablet, Delayed Release (E.C.) Take 1 Tab (75 mg total) by mouth Twice daily   . dicyclomine (BENTYL) 20 mg Oral Tablet Take 1 Tab (20 mg total) by mouth Four times a day   . Ferrous Sulfate (SLOW RELEASE IRON) 142 mg (45 mg iron) Oral Tablet Sustained Release Take 1 Tab (142 mg total) by mouth Three times a day as needed   . GLUCOS CHOND CPLX ADVANCED ORAL qd   . metoprolol (LOPRESSOR) 25 mg Oral Tablet Take 1 Tab (25 mg total) by mouth Twice daily   . metroNIDAZOLE (METROGEL) 1 % Gel with Pump by Apply externally route Once a day   . multivitamin Oral Tablet Take 1 Tab by mouth Once a day   . Omeprazole 20 mg Oral Tablet, Delayed Release (E.C.) take  by mouth.     Allergies   Allergen Reactions   . Sulfa (Sulfonamides) Rash   . Shellfish Containing Products Itching  and Swelling     Shrimp - eye swelling, itching       Family History:   Family Medical History:     Problem Relation (Age of Onset)    Asthma Father    COPD Father    Cancer Mother, Father    Coronary Artery Disease Mother    Diabetes Mother    High Cholesterol Mother    Hypertension Mother    Migraines Mother              Social History:   Social History     Social History   . Marital status: Married     Spouse name: N/A   . Number of children: N/A   . Years of education: N/A     Occupational History   .  [employer]     Social History Main Topics   . Smoking status: Never Smoker   . Smokeless tobacco: Never Used      Comment: very rare cigar   . Alcohol use 1.2 oz/week     2 Cans of beer per week      Comment: per week   . Drug use: No   . Sexual activity: Not on file     Other Topics Concern   . Abuse/Domestic Violence No   . Caffeine Concern No   . Calcium Intake Adequate Yes   . Computer Use Yes   . Drives Yes   . Exercise Concern No   . Seat Belt Yes   . Special Diet No   . Sunscreen Used Yes   . Right Hand Dominant Yes   . Routine Exercise No   . Ability To Walk 2 Flight Of Steps Without Sob/Cp Yes   . Ability To Do  Own Adl's Yes   . Other Activity Level Yes     14 steps at home. walks a lot. shoveled snow this winter. limited by pain.     Social History Narrative       Review of Systems:  Constitutional:  negative for fevers, chills, anorexia, fatigue and weight loss  Eyes:  negative for visual disturbance and irritation  Ears, nose, mouth, throat:  negative for hearing loss, nasal congestion, sore throat, hoarseness and voice change  Cardiac/vascular:  negative for chest pain, palpitations and lower extremity edema  Respiratory:  negative for hemoptysis, pleurisy/chest pain, cough and dyspnea  Gastrointestinal:  negative for dysphagia, nausea, vomiting, melena, diarrhea and abdominal pain  Genitourinary:  negative for dysuria, urinary incontinence and hematuria  Musculoskeletal:  negative for myalgias, muscle weakness, neck pain, back pain and hip pain   Neurological:  negative for seizures, speech problems, gait problems, dizziness and headaches  Behavioral/Psych:  negative for behavior problems  Hematologic/lymphatic:  negative for bleeding and lymphadenopathy  Integumentary (skin, breast):  negative for rash, skin lesion(s), pruritus and dryness  Endocrine:  negative  Allergic/Immunologic:  negative      Objective:   Most Recent Vitals       Return Patient Visit from 11/17/2016 in [facility]    Temperature 36.6 C (97.9 F) filed at... 11/17/2016 1004    Heart Rate 66 filed at... 11/17/2016 1004    Respiratory Rate     BP (Non-Invasive) 120/80 filed at... 11/17/2016 1004    Height     Weight 135.1 kg (297 lb 13.5 oz) filed at... 11/17/2016 1004    BMI (Calculated)     BSA (Calculated)       CONSTITUTIONAL:  No acute distress, KPS 90% - capable of normal activity, few symptoms or signs of disease, ECOG (0) Fully active, able to carry on all predisease performance without restriction  EYES: conjunctiva clear, pupils equal round and reactive to light  ENT: Normocephalic, atraumatic, moist oral cavity mucous  membranes  NECK: No masses or palpable lymphadenopathy  RESPIRATORY: Clear to auscultation bilaterally  CARDIOVASCULAR:Regular rate and rhythm, no murmurs appreciated.  GASTROINTESTINAL: Soft, non-tender, non-distended, no palpable hepatosplenomegaly or masses.    GENITOURINARY:  No palpable abnormality in prostate  MUSCULOSKELETAL:  No tenderness to palpation in joints or spine.    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 There is no clinical or biochemical evidence of recurrence. He will follow-up in 6 months with repeat PSA. I personally saw and examined the patient, and reviewed all recent findings with him.  I spent greater than 50% of a 15 minute visit in discussion of the patient's diagnosis and management.    Rexanne Mano. Lenore Manner, MD 11/17/2016, 11:31
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
## 2016-11-26 ENCOUNTER — Encounter (HOSPITAL_BASED_OUTPATIENT_CLINIC_OR_DEPARTMENT_OTHER): Payer: Self-pay

## 2016-12-05 ENCOUNTER — Other Ambulatory Visit: Payer: Self-pay

## 2016-12-17 ENCOUNTER — Encounter (HOSPITAL_BASED_OUTPATIENT_CLINIC_OR_DEPARTMENT_OTHER): Payer: Self-pay | Admitting: Orthopaedic Surgery

## 2017-01-06 ENCOUNTER — Other Ambulatory Visit (HOSPITAL_BASED_OUTPATIENT_CLINIC_OR_DEPARTMENT_OTHER): Payer: Self-pay | Admitting: Family Medicine

## 2017-02-03 ENCOUNTER — Other Ambulatory Visit: Payer: Self-pay

## 2017-02-03 ENCOUNTER — Encounter (HOSPITAL_COMMUNITY): Payer: Self-pay | Admitting: Emergency Medicine

## 2017-02-03 ENCOUNTER — Ambulatory Visit (HOSPITAL_COMMUNITY)
Admission: EM | Admit: 2017-02-03 | Discharge: 2017-02-03 | Disposition: A | Discharge status: Home / Self Care | Payer: Self-pay | Attending: Family Medicine | Admitting: Family Medicine

## 2017-02-03 DIAGNOSIS — G8929 Other chronic pain: Secondary | ICD-10-CM

## 2017-02-03 DIAGNOSIS — M549 Dorsalgia, unspecified: Secondary | ICD-10-CM

## 2017-02-03 MED ORDER — DICLOFENAC SODIUM 75 MG PO TBEC: 75.0000 mg | DELAYED_RELEASE_TABLET | Freq: Two times a day (BID) | ORAL | 0 refills | Status: DC

## 2017-02-03 NOTE — Discharge Instructions (Signed)
HOME CARE INSTRUCTIONS: For many people, back pain returns. Since back pain is rarely dangerous, it is often a condition that people can learn to manage on their own. Please remain active. It is stressful on the back to sit or stand in one place. Do not sit, drive, or stand in one place for more than 30 minutes at a time. Take short walks on level surfaces as soon as pain allows. Try to increase the length of time you walk each day. Do not stay in bed. Resting more than 1 or 2 days can delay your recovery. Do not avoid exercise or work. Your body is made to move. It is not dangerous to be active, even though your back may hurt. Your back will likely heal faster if you return to being active before your pain is gone. Over-the-counter medicines to reduce pain and inflammation are often the most helpful. ° °SEEK MEDICAL CARE IF: °You have pain that is not relieved with rest or medicine. °You have pain that does not improve in 1 week. °You have new symptoms. °You are generally not feeling well. ° °SEEK IMMEDIATE MEDICAL CARE IF: °You have pain that radiates from your back into your legs. °You develop new bowel or bladder control problems. °You have unusual weakness or numbness in your arms or legs. °You develop nausea or vomiting. °You develop abdominal pain. °You feel faint. ° °
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
## 2017-02-18 ENCOUNTER — Encounter (HOSPITAL_BASED_OUTPATIENT_CLINIC_OR_DEPARTMENT_OTHER): Payer: Self-pay | Admitting: Orthopaedic Surgery

## 2017-02-22 ENCOUNTER — Other Ambulatory Visit (HOSPITAL_COMMUNITY): Payer: Self-pay | Admitting: Radiation Oncology

## 2017-02-22 DIAGNOSIS — C61 Malignant neoplasm of prostate: Secondary | ICD-10-CM

## 2017-02-22 DIAGNOSIS — Z006 Encounter for examination for normal comparison and control in clinical research program: Secondary | ICD-10-CM

## 2017-02-23 ENCOUNTER — Encounter (HOSPITAL_COMMUNITY): Payer: Self-pay | Admitting: Radiation Oncology

## 2017-02-23 ENCOUNTER — Inpatient Hospital Stay
Admission: RE | Admit: 2017-02-23 | Discharge: 2017-02-23 | Disposition: A | Discharge status: Still In Hospital | Payer: No Typology Code available for payment source | Source: Ambulatory Visit | Attending: Radiation Oncology | Admitting: Radiation Oncology

## 2017-02-23 ENCOUNTER — Encounter (HOSPITAL_BASED_OUTPATIENT_CLINIC_OR_DEPARTMENT_OTHER): Payer: Self-pay

## 2017-02-23 DIAGNOSIS — Z006 Encounter for examination for normal comparison and control in clinical research program: Secondary | ICD-10-CM

## 2017-02-23 DIAGNOSIS — Z923 Personal history of irradiation: Secondary | ICD-10-CM | POA: Insufficient documentation

## 2017-02-23 DIAGNOSIS — Z882 Allergy status to sulfonamides status: Secondary | ICD-10-CM | POA: Insufficient documentation

## 2017-02-23 DIAGNOSIS — Z79899 Other long term (current) drug therapy: Secondary | ICD-10-CM | POA: Insufficient documentation

## 2017-02-23 DIAGNOSIS — C61 Malignant neoplasm of prostate: Secondary | ICD-10-CM | POA: Insufficient documentation

## 2017-02-23 DIAGNOSIS — Z08 Encounter for follow-up examination after completed treatment for malignant neoplasm: Secondary | ICD-10-CM

## 2017-02-23 LAB — PSA, DIAGNOSTIC: PSA: 0.3 ng/mL (ref <=4.0)

## 2017-02-23 NOTE — Progress Notes (Addendum)
[id number] - Pt was in for 9 month follow up after completion of radiation therapy. AEs were reviewed with patient and pt states that parasthesia of hands and feet has ended after physical therapy.  Lt hip pain is still present.  Pt questioned about diarrhea or urinary problems and these are denied.  He states he has no new problems. Physical exam was performed by Dr. Lenore Manner. PSA was drawn and is 0.3 today.    Dr. Lenore Manner reviewed this result and feels this result is stable without indicating recurrence.  Pt is to return in 3 months for 12 month follow up.  Pt is aware of appt time.  Pt has my number and the number of the clinic and was encouraged to call with any new questions or problems.      Current open AEs:    Lt hip pain gr 1    Henry Junes, RN

## 2017-02-23 NOTE — Progress Notes (Signed)
MSW was consulted to meet with pt for the purpose of completing a Combined MPOA/LW. MSW met with pt in MSW office; Combined MPOA/LW was explained, discussed, and completed. Pt appointed [employer] [phone]) as his first representative and Larone Kliethermes [phone]) as his second representative. Pt opted to have MPOA faxed into the [facility] e-Directive Registry; MSW faxed in pt's Combined MPOA/LW. MSW provided pt with original and 3 copies of Combined MPOA/LW and placed one copy into the scan bin to be scanned into pt's electronic chart.       [facility] e-Directive Registry:  F: [phone]      Laqueta Due, MSW

## 2017-02-23 NOTE — Cancer Center Note (Addendum)
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 Since treatment his frequency and obstructive urinary symptoms have resolved. Bowel movements are also back to normal. His PSA increased slightly from 0.2 to 0.3 today. He had some numbness in his hands thought to be due to carpal tunnel syndrome which has now resolved with physical therapy. He denies any other symptoms.       Medications:  Current Outpatient Medications   Medication Sig   . atorvastatin (LIPITOR) 20 mg Oral Tablet Take 1 Tab (20 mg total) by mouth Every evening   . diclofenac sodium (VOLTAREN) 75 mg Oral Tablet, Delayed Release (E.C.) Take 1 Tab (75 mg total) by mouth Twice daily   . dicyclomine (BENTYL) 20 mg Oral Tablet Take 1 Tab (20 mg total) by mouth Four times a day   . Ferrous Sulfate (SLOW RELEASE IRON) 142 mg (45 mg iron) Oral Tablet Sustained Release Take 1 Tab (142 mg total) by mouth Three times a day as needed   . GLUCOS CHOND CPLX ADVANCED ORAL qd   . metoprolol tartrate (LOPRESSOR) 25 mg Oral Tablet Take 1 Tab (25 mg total) by mouth Twice daily   . metroNIDAZOLE (METROGEL) 1 % Gel with Pump by Apply externally route Once a day   . multivitamin Oral Tablet Take 1 Tab by mouth Once a day   . Omeprazole 20 mg Oral Tablet, Delayed Release (E.C.) take  by mouth.       Allergies   Allergen Reactions   . Sulfa (Sulfonamides) Rash   . Shellfish Containing Products Itching and Swelling     Shrimp - eye swelling,  itching       Review of Systems:  Constitutional:  negative for fevers, chills, anorexia, fatigue and weight loss  Eyes:  negative for visual disturbance and irritation  Ears, nose, mouth, throat:  negative for hearing loss, nasal congestion, sore throat, hoarseness and voice change  Cardiac/vascular:  negative for chest pain, palpitations and lower extremity edema  Respiratory:  negative for hemoptysis, pleurisy/chest pain, cough and dyspnea  Gastrointestinal:  negative for dysphagia, nausea, vomiting, melena, diarrhea and abdominal pain  Genitourinary:  negative for dysuria, urinary incontinence and hematuria  Musculoskeletal:  negative for myalgias, muscle weakness, neck pain, back pain and hip pain   Neurological:  negative for seizures, speech problems, gait problems, dizziness and headaches  Behavioral/Psych:  negative for behavior problems  Hematologic/lymphatic:  negative for bleeding and lymphadenopathy  Integumentary (skin, breast):  negative for rash, skin lesion(s), pruritus and dryness  Endocrine:  negative  Allergic/Immunologic:  negative      Objective:     Vitals:    02/23/17 1012   BP: 140/90   Pulse: 63   Temp: 36.5 C (97.7 F)   SpO2: 95%   Weight: 135 kg (297 lb 9.9 oz)     Pain Score (Numeric, Faces): 4  CONSTITUTIONAL:  No acute distress, KPS 90% - capable of normal activity, few symptoms or signs of disease, ECOG (0) Fully active, able to carry on all predisease performance without restriction  EYES: conjunctiva clear, pupils equal round and reactive to light  ENT: Normocephalic, atraumatic, moist oral cavity mucous membranes  NECK: No masses or palpable lymphadenopathy  RESPIRATORY: Clear to auscultation bilaterally  CARDIOVASCULAR:Regular rate and rhythm, no murmurs appreciated.  GASTROINTESTINAL: Soft, non-tender, non-distended, no palpable hepatosplenomegaly or masses.    GENITOURINARY:  No palpable prostate nodularity  MUSCULOSKELETAL:  No tenderness to palpation in joints or spine.       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 There is no clinical or biochemical evidence of recurrence. He will follow-up in 3 months with repeat PSA.      I personally saw and examined the patient, and reviewed all recent findings with him.  I spent greater than 50% of a 15 minute visit in discussion of the patient's diagnosis and management.    Rexanne Mano. Lenore Manner, MD 02/23/2017, 10:22

## 2017-03-29 DIAGNOSIS — S61402A Unspecified open wound of left hand, initial encounter: Secondary | ICD-10-CM | POA: Diagnosis not present

## 2017-03-29 DIAGNOSIS — T148XXA Other injury of unspecified body region, initial encounter: Secondary | ICD-10-CM | POA: Diagnosis not present

## 2017-03-29 DIAGNOSIS — R202 Paresthesia of skin: Secondary | ICD-10-CM | POA: Diagnosis not present

## 2017-03-29 DIAGNOSIS — G8929 Other chronic pain: Secondary | ICD-10-CM | POA: Diagnosis not present

## 2017-03-29 DIAGNOSIS — S61231A Puncture wound without foreign body of left index finger without damage to nail, initial encounter: Secondary | ICD-10-CM | POA: Diagnosis not present

## 2017-03-29 DIAGNOSIS — M542 Cervicalgia: Secondary | ICD-10-CM | POA: Diagnosis not present

## 2017-03-29 DIAGNOSIS — F17290 Nicotine dependence, other tobacco product, uncomplicated: Secondary | ICD-10-CM | POA: Diagnosis not present

## 2017-04-01 ENCOUNTER — Emergency Department (HOSPITAL_COMMUNITY)
Admission: EM | Admit: 2017-04-01 | Discharge: 2017-04-01 | Disposition: A | Discharge status: Home / Self Care | Payer: Medicare Other | Attending: Emergency Medicine | Admitting: Emergency Medicine

## 2017-04-01 ENCOUNTER — Encounter (HOSPITAL_COMMUNITY): Payer: Self-pay | Admitting: Emergency Medicine

## 2017-04-01 DIAGNOSIS — Z1330 Encounter for screening examination for mental health and behavioral disorders, unspecified: Secondary | ICD-10-CM | POA: Diagnosis present

## 2017-04-01 DIAGNOSIS — F1210 Cannabis abuse, uncomplicated: Secondary | ICD-10-CM | POA: Insufficient documentation

## 2017-04-01 DIAGNOSIS — F17210 Nicotine dependence, cigarettes, uncomplicated: Secondary | ICD-10-CM | POA: Diagnosis not present

## 2017-04-01 DIAGNOSIS — F4310 Post-traumatic stress disorder, unspecified: Secondary | ICD-10-CM | POA: Insufficient documentation

## 2017-04-01 DIAGNOSIS — Z79899 Other long term (current) drug therapy: Secondary | ICD-10-CM | POA: Diagnosis not present

## 2017-04-01 HISTORY — DX: Other intervertebral disc degeneration, thoracic region: M51.34

## 2017-04-01 NOTE — ED Notes (Signed)
Patient is stating he needs to go due to people expecting him-[facility] the [facility]-states he is not SI or HI-states he voiced HI a few days ago [place] hot[place]

## 2017-04-01 NOTE — ED Notes (Signed)
Pt denies SI, HI at discharge. He also denies recheck of vitals

## 2017-04-01 NOTE — Discharge Instructions (Signed)
Your evaluated in the emergency department for concerns that you may harm herself or somebody else.  You were offered behavioral health evaluation which she declined.  Currently I do not feel you warrant an involuntary commitment.  You understand that you should call 911 or re-present to the emergency department if you feel your anger is out of control or you are thinking of hurting herself or others.

## 2017-04-01 NOTE — ED Triage Notes (Signed)
[facility] making remarks of "blowing his head of with a gun" [facility] because [facility] called them wanting "to talk" and he has been hung up on multiple times. They have changed his PCP and psychiatrist and patient is upset about it.

## 2017-04-01 NOTE — ED Provider Notes (Signed)
[facility]EMERGENCY DEPT Provider Note   CSN: [id number] Arrival date & time: 04/01/17  1454     History   Chief Complaint Chief Complaint  Patient presents with  . Suicidal    HPI Martin Singleton is a 62 y.o. male. 62 year old male with history of PTSD presents in police custody after being brought here by them from home.  Patient was very upset with an interaction at the [place] [facility] where he reports he repeatedly was hung up on and was not treated with respect.  Patient states he then called suicide hotline and told him he would shoot himself with a gun.  Patient states he does have access to a gun.  He denies here that he would ever do something like that.  He states he was just very angry.  He has a Veterinary surgeon and was there yesterday and it sounds like he is using his medication and following with a counselor routinely.  States he was hospitalized for psychiatric issues about 5 years ago.  He denies any active medical complaints but does have chronic back pain.   The history is provided by the patient and the police.  Mental Health Problem  Presenting symptoms: aggressive behavior and suicidal threats   Patient accompanied by:  Law enforcement Onset quality:  Sudden Duration:  1 day Progression:  Resolved Context: stressful life event   Context: not alcohol use and not drug abuse   Treatment compliance:  All of the time Ineffective treatments:  None tried Associated symptoms: no abdominal pain, no chest pain, no headaches and no hypersomnia   Risk factors: hx of mental illness     Past Medical History:  Diagnosis Date  . Cluster headaches    entered in Seroquel study at [facility] 9-09  . Colon polyps    5 tubular adenomas on 11-06-11  . Degenerative disc disease, thoracic   . Low back pain    from Spinal stenosis  . Neck pain    from cervical spondylosis  . Other social stressor    with depression  . Pure hypercholesterolemia     There are no active  problems to display for this patient.   Past Surgical History:  Procedure Laterality Date  . COLONOSCOPY         Home Medications    Prior to Admission medications   Medication Sig Start Date End Date Taking? Authorizing Provider  cholecalciferol (VITAMIN D) 400 UNITS TABS tablet Take 400 Units by mouth. Taking 3 capsules    [provider]  clonazePAM (KLONOPIN) 2 MG tablet Take 2 mg by mouth 2 (two) times daily.    [provider]  diclofenac (VOLTAREN) 75 MG EC tablet Take 1 tablet (75 mg total) by mouth 2 (two) times daily. 02/03/17   Mardella Layman, MD  Mirtazapine (REMERON PO) Take 40 mg by mouth daily.    [provider]  morphine (MS CONTIN) 60 MG 12 hr tablet Take 60 mg by mouth every 12 (twelve) hours.    [provider]  MORPHINE SULFATE PO Take 30 mg by mouth as needed.    [provider]  Multiple Vitamin (MULTIVITAMIN) capsule Take 1 capsule by mouth daily.    [provider]  Multiple Vitamins-Minerals (PRESERVISION AREDS PO) Take 1 capsule by mouth 2 (two) times daily.    [provider]  Omega-3 Fatty Acids (FISH OIL) 1000 MG CAPS Take 1,000 mg by mouth daily.    [provider]  omeprazole (PRILOSEC) 20 MG capsule Take 40 mg by mouth daily.    [provider]  sertraline (ZOLOFT) 100 MG tablet Take 100 mg by mouth daily.    [provider]    Family History No family history on file.  Social History Social History   Tobacco Use  . Smoking status: Current Every Day Smoker    Types: Cigars  . Smokeless tobacco: Never Used  Substance Use Topics  . Alcohol use: Yes  . Drug use: Yes    Types: Marijuana     Allergies   Codeine; Hydrocodone bitartrate er; Lyrica [pregabalin]; Methadone hcl; Nucynta [tapentadol]; Sansert [methysergide]; and Xanax [alprazolam]   Review of Systems Review of Systems  Constitutional: Negative for fever.  HENT: Negative for sore throat.     Respiratory: Negative for shortness of breath.   Cardiovascular: Negative for chest pain.  Gastrointestinal: Negative for abdominal pain.  Genitourinary: Negative for dysuria.  Skin: Negative for rash.  Neurological: Negative for headaches.     Physical Exam Updated Vital Signs BP (!) 157/89 (BP Location: Left Arm)   Pulse 71   Temp 98.1 F (36.7 C) (Oral)   Resp 18   Ht 6\' 1"  (1.854 m)   SpO2 99%   Physical Exam  Constitutional: He appears well-developed and well-nourished.  HENT:  Head: Normocephalic and atraumatic.  Eyes: Conjunctivae are normal.  Neck: Neck supple.  Pulmonary/Chest: Effort normal.  Neurological: He is alert. GCS eye subscore is 4. GCS verbal subscore is 5. GCS motor subscore is 6.  Skin: Skin is warm and dry.  Psychiatric: He has a normal mood and affect. His speech is normal. Thought content normal. He is agitated. Cognition and memory are normal.  Nursing note and vitals reviewed.    ED Treatments / Results  Labs (all labs ordered are listed, but only abnormal results are displayed) Labs Reviewed - No data to display  EKG  EKG Interpretation None       Radiology No results found.  Procedures Procedures (including critical care time)  Medications Ordered in ED Medications - No data to display   Initial Impression / Assessment and Plan / ED Course  I have reviewed the triage vital signs and the nursing notes.  Pertinent labs & imaging results that were available during my care of the patient were reviewed by me and considered in my medical decision making (see chart for details).   Long discussion with the patient and then with the police officer brought him in.  His officer said he has been voluntary and was appropriate during their time but he does get angry when [place] [facility].  He states he is never had any run-ins with him before.  Patient denies that he would never follow through on these actions.  He attributes it to  being very frustrated with the system.    Final Clinical Impressions(s) / ED Diagnoses   Final diagnoses:  PTSD (post-traumatic stress disorder)    ED Discharge Orders    None       Terrilee FilesButler, Graycee Greeson C, MD 04/03/17 1154

## 2017-04-01 NOTE — ED Notes (Signed)
Bed: WLPT4 Expected date:  Expected time:  Means of arrival:  Comments: 

## 2017-04-07 DIAGNOSIS — G8929 Other chronic pain: Secondary | ICD-10-CM | POA: Diagnosis not present

## 2017-04-10 ENCOUNTER — Encounter (HOSPITAL_BASED_OUTPATIENT_CLINIC_OR_DEPARTMENT_OTHER): Payer: Self-pay | Admitting: Family Medicine

## 2017-04-12 ENCOUNTER — Encounter (HOSPITAL_BASED_OUTPATIENT_CLINIC_OR_DEPARTMENT_OTHER): Payer: Self-pay | Admitting: FAMILY PRACTICE

## 2017-04-15 ENCOUNTER — Encounter (HOSPITAL_BASED_OUTPATIENT_CLINIC_OR_DEPARTMENT_OTHER): Payer: Self-pay | Admitting: Orthopaedic Surgery

## 2017-04-19 DIAGNOSIS — M5489 Other dorsalgia: Secondary | ICD-10-CM | POA: Diagnosis not present

## 2017-04-19 DIAGNOSIS — R402441 Other coma, without documented Glasgow coma scale score, or with partial score reported, in the field [EMT or ambulance]: Secondary | ICD-10-CM | POA: Diagnosis not present

## 2017-04-21 ENCOUNTER — Emergency Department (HOSPITAL_COMMUNITY)
Admission: EM | Admit: 2017-04-21 | Discharge: 2017-04-22 | Disposition: A | Discharge status: Home / Self Care | Payer: Medicare Other | Attending: Emergency Medicine | Admitting: Emergency Medicine

## 2017-04-21 ENCOUNTER — Encounter (HOSPITAL_COMMUNITY): Payer: Self-pay

## 2017-04-21 ENCOUNTER — Emergency Department (HOSPITAL_COMMUNITY): Payer: Medicare Other

## 2017-04-21 DIAGNOSIS — F4310 Post-traumatic stress disorder, unspecified: Secondary | ICD-10-CM | POA: Insufficient documentation

## 2017-04-21 DIAGNOSIS — R454 Irritability and anger: Secondary | ICD-10-CM | POA: Diagnosis not present

## 2017-04-21 DIAGNOSIS — F4325 Adjustment disorder with mixed disturbance of emotions and conduct: Secondary | ICD-10-CM | POA: Diagnosis not present

## 2017-04-21 DIAGNOSIS — R451 Restlessness and agitation: Secondary | ICD-10-CM | POA: Diagnosis not present

## 2017-04-21 DIAGNOSIS — R45851 Suicidal ideations: Secondary | ICD-10-CM | POA: Insufficient documentation

## 2017-04-21 DIAGNOSIS — R200 Anesthesia of skin: Secondary | ICD-10-CM | POA: Insufficient documentation

## 2017-04-21 DIAGNOSIS — Z79899 Other long term (current) drug therapy: Secondary | ICD-10-CM | POA: Insufficient documentation

## 2017-04-21 DIAGNOSIS — R569 Unspecified convulsions: Secondary | ICD-10-CM | POA: Diagnosis not present

## 2017-04-21 DIAGNOSIS — R4182 Altered mental status, unspecified: Secondary | ICD-10-CM | POA: Diagnosis not present

## 2017-04-21 DIAGNOSIS — F17210 Nicotine dependence, cigarettes, uncomplicated: Secondary | ICD-10-CM | POA: Diagnosis not present

## 2017-04-21 DIAGNOSIS — R5383 Other fatigue: Secondary | ICD-10-CM | POA: Diagnosis not present

## 2017-04-21 HISTORY — DX: Unspecified convulsions: R56.9

## 2017-04-21 LAB — CBC
HCT: 35.6 % — ABNORMAL LOW (ref 39.0–52.0)
Hemoglobin: 11.4 g/dL — ABNORMAL LOW (ref 13.0–17.0)
MCH: 29.3 pg (ref 26.0–34.0)
MCHC: 32 g/dL (ref 30.0–36.0)
MCV: 91.5 fL (ref 78.0–100.0)
PLATELETS: 231 10*3/uL (ref 150–400)
RBC: 3.89 MIL/uL — ABNORMAL LOW (ref 4.22–5.81)
RDW: 13.9 % (ref 11.5–15.5)
WBC: 5.5 10*3/uL (ref 4.0–10.5)

## 2017-04-21 LAB — BASIC METABOLIC PANEL
Anion gap: 7 (ref 5–15)
BUN: 17 mg/dL (ref 6–20)
CALCIUM: 8.9 mg/dL (ref 8.9–10.3)
CO2: 27 mmol/L (ref 22–32)
Chloride: 105 mmol/L (ref 101–111)
Creatinine, Ser: 0.8 mg/dL (ref 0.61–1.24)
Glucose, Bld: 126 mg/dL — ABNORMAL HIGH (ref 65–99)
Potassium: 3.6 mmol/L (ref 3.5–5.1)
SODIUM: 139 mmol/L (ref 135–145)

## 2017-04-21 LAB — URINALYSIS, ROUTINE W REFLEX MICROSCOPIC
BILIRUBIN URINE: NEGATIVE
Glucose, UA: NEGATIVE mg/dL
Hgb urine dipstick: NEGATIVE
KETONES UR: NEGATIVE mg/dL
Leukocytes, UA: NEGATIVE
NITRITE: NEGATIVE
PH: 5 (ref 5.0–8.0)
PROTEIN: NEGATIVE mg/dL
Specific Gravity, Urine: 1.017 (ref 1.005–1.030)

## 2017-04-21 LAB — SALICYLATE LEVEL: Salicylate Lvl: <7 mg/dL (ref 2.8–30.0)

## 2017-04-21 LAB — I-STAT TROPONIN, ED: TROPONIN I, POC: 0.01 ng/mL (ref 0.00–0.08)

## 2017-04-21 LAB — RAPID URINE DRUG SCREEN, HOSP PERFORMED
Amphetamines: NOT DETECTED
BARBITURATES: NOT DETECTED
Benzodiazepines: NOT DETECTED
COCAINE: NOT DETECTED
Opiates: POSITIVE — AB
Tetrahydrocannabinol: POSITIVE — AB

## 2017-04-21 LAB — ACETAMINOPHEN LEVEL: Acetaminophen (Tylenol), Serum: <10 ug/mL — ABNORMAL LOW (ref 10–30)

## 2017-04-21 MED ORDER — LORAZEPAM 2 MG/ML IJ SOLN: 0.0000 mg | Freq: Four times a day (QID) | INTRAMUSCULAR | Status: DC

## 2017-04-21 MED ORDER — LORAZEPAM 1 MG PO TABS: 0.0000 mg | ORAL_TABLET | Freq: Two times a day (BID) | ORAL | Status: DC

## 2017-04-21 MED ORDER — PANTOPRAZOLE SODIUM 40 MG PO TBEC
40.0000 mg | DELAYED_RELEASE_TABLET | Freq: Every day | ORAL | Status: DC
  Administered 2017-04-21 – 2017-04-22 (×2): 40 mg via ORAL
  Filled 2017-04-21 (×2): qty 1

## 2017-04-21 MED ORDER — PROMETHAZINE HCL 25 MG PO TABS: 25.0000 mg | ORAL_TABLET | Freq: Three times a day (TID) | ORAL | Status: DC | PRN

## 2017-04-21 MED ORDER — LORAZEPAM 2 MG/ML IJ SOLN: 0.0000 mg | Freq: Two times a day (BID) | INTRAMUSCULAR | Status: DC

## 2017-04-21 MED ORDER — VITAMIN B-1 100 MG PO TABS
100.0000 mg | ORAL_TABLET | Freq: Every day | ORAL | Status: DC
  Administered 2017-04-21 – 2017-04-22 (×2): 100 mg via ORAL
  Filled 2017-04-21 (×2): qty 1

## 2017-04-21 MED ORDER — CHOLECALCIFEROL 10 MCG (400 UNIT) PO TABS
400.0000 [IU] | ORAL_TABLET | Freq: Every day | ORAL | Status: DC
  Administered 2017-04-21 – 2017-04-22 (×2): 400 [IU] via ORAL
  Filled 2017-04-21 (×2): qty 1

## 2017-04-21 MED ORDER — LORAZEPAM 1 MG PO TABS
0.0000 mg | ORAL_TABLET | Freq: Four times a day (QID) | ORAL | Status: DC
  Administered 2017-04-21: 1 mg via ORAL
  Administered 2017-04-22: 2 mg via ORAL
  Filled 2017-04-21 (×2): qty 1
  Filled 2017-04-21: qty 2

## 2017-04-21 MED ORDER — THIAMINE HCL 100 MG/ML IJ SOLN: 100.0000 mg | Freq: Every day | INTRAMUSCULAR | Status: DC

## 2017-04-21 MED ORDER — CLONAZEPAM 0.5 MG PO TABS: 0.5000 mg | ORAL_TABLET | Freq: Three times a day (TID) | ORAL | Status: DC | PRN

## 2017-04-21 NOTE — ED Notes (Signed)
RN went with Security and locked the specified below into  Safe with security.  Pt had signed saying that he attested to his belongings being placed in safe
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
## 2017-04-21 NOTE — ED Notes (Signed)
Bed: WA28 Expected date:  Expected time:  Means of arrival:  Comments: 

## 2017-04-21 NOTE — Discharge Instructions (Addendum)
You will need to refrain from driving until you are seen, evaluated, and cleared by the neurologist. You need to call the neurology office and schedule an appointment for follow-up within the next two weeks.  You will need to schedule an outpatient EEG and MRI.  You will also need to follow-up with your primary care doctor about your visit today within the next week.  You will need to return to the emergency department for any new or worsening symptoms including any chest pain, shortness of breath, passing out, weakness/numbness to your arms or legs, changes in mental status, vision changes.

## 2017-04-21 NOTE — ED Provider Notes (Addendum)
[facility]-EMERGENCY DEPT Provider Note   CSN: [id number] Arrival date & time: 04/21/17  1148     History   Chief Complaint No chief complaint on file.   HPI Martin Singleton is a 62 y.o. male.  HPI   Pt is a 62 y/o male with a h/o seizures, reported PTSD, chronic neck/back pain who presents to the ED today to be evaluated for a reported seizure that occurred PTA. States he was sitting and talking with a friend PTA, became very upset talking about his [employer], and then he started jerking, twitching, and shaking all over. States that he was not able to speak during the episode, but remembers the episodes. States, "I have a vague recollection of what went on during the seizure." No tongue biting or urinary incontinence. He states he had blurry vision and became lightheaded during the episode and that it lasted 5-10 minutes.  States he currently feels generalized fatigue and left sided headache consistent with prior headaches. No unilateral weakness. States fingers and toes are numb which is chronic for him but worse today. States his last seizure was 10 days ago and he came to the ER in [place] and was later discharged.   States he is not on medication for seizures, but was diagnosed with absence seizures about 5 years ago. States he was seen by neurologist at the [place] several years ago for this.  Patient endorses cannabis use, however but denies any other drug use stating that he quit using all other drugs several years ago after an overdose.  States that he had some alcohol on his coffee this morning.  States he has about half a cup of [place] cream daily.  States he also takes 15-20 mg of morphine daily.   Per EMS, pt had petite mal seizure for 5 minutes at his pharmacy. Reports from EMS of no incontinence, no oral trauma, no head injury or fall (as pt was sitting in chair).  Vitals were 180/100 initially, then decreased to 140/84.  Blood sugar was 147.    During  my exam, patient became tearful stating that he wants to die.  States that he wishes a Emergency planning/management officer would kill him.  States he also feels homicidal ideations went people "pissed me off ".  He has no particular homicidal ideations currently.  Denies AVH.  Past Medical History:  Diagnosis Date  . Cluster headaches    entered in Seroquel study at [facility] 9-09  . Colon polyps    5 tubular adenomas on 11-06-11  . Degenerative disc disease, thoracic   . Low back pain    from Spinal stenosis  . Neck pain    from cervical spondylosis  . Other social stressor    with depression  . Pure hypercholesterolemia   . Seizures (HCC)     There are no active problems to display for this patient.   Past Surgical History:  Procedure Laterality Date  . COLONOSCOPY         Home Medications    Prior to Admission medications   Medication Sig Start Date End Date Taking? Authorizing Provider  cholecalciferol (VITAMIN D) 400 UNITS TABS tablet Take 400 Units by mouth. Taking 3 capsules   Yes [provider]  clonazePAM (KLONOPIN) 0.5 MG tablet Take 0.5 mg by mouth 3 (three) times daily as needed for anxiety.    Yes [provider]  MORPHINE SULFATE PO Take 30 mg by mouth as needed.  Yes [provider]  omeprazole (PRILOSEC) 20 MG capsule Take 40 mg by mouth daily.   Yes [provider]  promethazine (PHENERGAN) 25 MG tablet Take 25 mg by mouth every 8 (eight) hours as needed for nausea.  04/05/08  Yes [provider]  diclofenac (VOLTAREN) 75 MG EC tablet Take 1 tablet (75 mg total) by mouth 2 (two) times daily. Patient not taking: Reported on 04/21/2017 02/03/17   Mardella LaymanHagler, Brian, MD    Family History No family history on file.  Social History Social History   Tobacco Use  . Smoking status: Current Every Day Smoker    Types: Cigars  . Smokeless tobacco: Never Used  Substance Use Topics  . Alcohol use: Yes  . Drug use: Yes    Types: Marijuana      Allergies   Codeine; Hydrocodone bitartrate er; Lyrica [pregabalin]; Methadone hcl; Nucynta [tapentadol]; Sansert [methysergide]; and Xanax [alprazolam]   Review of Systems Review of Systems  Constitutional: Negative for fever.  HENT: Negative for trouble swallowing.        No tongue biting  Eyes: Negative for pain.       Dim vision  Respiratory: Negative for cough and shortness of breath.   Cardiovascular: Negative for chest pain.  Gastrointestinal: Negative for abdominal pain, constipation, diarrhea, nausea and vomiting.  Genitourinary:       No urinary incontinence  Musculoskeletal: Positive for back pain (chronic).  Neurological: Positive for light-headedness, numbness and headaches. Negative for dizziness and weakness.       Reported seizure, no loss of consciousness     Physical Exam Updated Vital Signs BP (!) 148/86   Pulse 73   Temp 98.2 F (36.8 C) (Oral)   Resp 14   SpO2 99%   Physical Exam  Constitutional: He appears well-developed and well-nourished. No distress.  HENT:  Head: Normocephalic and atraumatic.  Right Ear: External ear normal.  Left Ear: External ear normal.  Eyes: Conjunctivae and EOM are normal. Pupils are equal, round, and reactive to light.  Neck: Normal range of motion. Neck supple.  Cardiovascular: Normal rate, regular rhythm, normal heart sounds and intact distal pulses.  No murmur heard. Pulmonary/Chest: Effort normal and breath sounds normal. No respiratory distress. He has no wheezes.  Abdominal: Soft. Bowel sounds are normal. He exhibits no distension. There is no tenderness.  Musculoskeletal: He exhibits no edema.  Neurological: He is alert.  Mental Status:  Alert, able to give a coherent history. Speech fluent without evidence of aphasia. Able to follow 2 step commands without difficulty.  Cranial Nerves:  II:  Peripheral visual fields grossly normal, pupils equal, round, reactive to light III,IV, VI: ptosis not present,  extra-ocular motions intact bilaterally  V,VII: smile symmetric, see below for sensation comment VIII: hearing grossly normal to voice  X: uvula elevates symmetrically  XI: bilateral shoulder shrug symmetric and strong XII: midline tongue extension without fassiculations Motor:  Normal tone. 5/5 strength to lue and lle, rue with 4/5 and appears to give way during elbow extension, however this is not consistent and intermittently has 5/5 strength with extension, 5/5 strength elbow flexion, rle with decreased strength with hip flexion 4/5, 5/5 dorsiflexion/plantarflexion Sensory: subjective decreased sensation to rue, rle, and face, however facial numbness crosses midline Cerebellar: normal finger-to-nose with bilateral upper extremities CV: 2+ radial and DP/PT pulses No facial droop and no pronator drift when held for full 60 seconds  Skin: Skin is warm and dry.  Psychiatric: He has a  normal mood and affect.  Nursing note and vitals reviewed.    ED Treatments / Results  Labs (all labs ordered are listed, but only abnormal results are displayed) Labs Reviewed  CBC - Abnormal; Notable for the following components:      Result Value   RBC 3.89 (*)    Hemoglobin 11.4 (*)    HCT 35.6 (*)    All other components within normal limits  BASIC METABOLIC PANEL - Abnormal; Notable for the following components:   Glucose, Bld 126 (*)    All other components within normal limits  RAPID URINE DRUG SCREEN, HOSP PERFORMED - Abnormal; Notable for the following components:   Opiates POSITIVE (*)    Tetrahydrocannabinol POSITIVE (*)    All other components within normal limits  ACETAMINOPHEN LEVEL - Abnormal; Notable for the following components:   Acetaminophen (Tylenol), Serum <10 (*)    All other components within normal limits  URINALYSIS, ROUTINE W REFLEX MICROSCOPIC  SALICYLATE LEVEL  I-STAT TROPONIN, ED    EKG  EKG Interpretation None      Date: 04/21/2017  Rate: 60  Rhythm:  normal sinus rhythm  QRS Axis: normal  Intervals: normal  ST/T Wave abnormalities: normal  Conduction Disutrbances: none  Narrative Interpretation:   Old EKG Reviewed: no prior ecg   Radiology Ct Head Wo Contrast  Result Date: 04/21/2017 CLINICAL DATA:  Seizure and altered mental status. EXAM: CT HEAD WITHOUT CONTRAST TECHNIQUE: Contiguous axial images were obtained from the base of the skull through the vertex without intravenous contrast. COMPARISON:  CT head dated August 10, 2009. FINDINGS: Brain: No evidence of acute infarction, hemorrhage, hydrocephalus, extra-axial collection or mass lesion/mass effect. Stable mild cerebral atrophy. Vascular: No hyperdense vessel or unexpected calcification. Skull: Normal. Negative for fracture or focal lesion. Sinuses/Orbits: No acute finding. Minimal bilateral maxillary sinus mucosal thickening. Other: None. IMPRESSION: 1.  No acute intracranial abnormality. Electronically Signed   By: Obie Dredge M.D.   On: 04/21/2017 14:40    Procedures Procedures (including critical care time)  Medications Ordered in ED Medications  LORazepam (ATIVAN) injection 0-4 mg ( Intravenous See Alternative 04/21/17 1840)    Or  LORazepam (ATIVAN) tablet 0-4 mg (1 mg Oral Given 04/21/17 1840)  LORazepam (ATIVAN) injection 0-4 mg (not administered)    Or  LORazepam (ATIVAN) tablet 0-4 mg (not administered)  thiamine (VITAMIN B-1) tablet 100 mg (100 mg Oral Given 04/21/17 1815)    Or  thiamine (B-1) injection 100 mg ( Intravenous See Alternative 04/21/17 1815)  cholecalciferol (VITAMIN D) tablet 400 Units (400 Units Oral Given 04/21/17 1814)  clonazePAM (KLONOPIN) tablet 0.5 mg (not administered)  pantoprazole (PROTONIX) EC tablet 40 mg (40 mg Oral Given 04/21/17 1814)  promethazine (PHENERGAN) tablet 25 mg (not administered)     Initial Impression / Assessment and Plan / ED Course  I have reviewed the triage vital signs and the nursing notes.  Pertinent labs & imaging  results that were available during my care of the patient were reviewed by me and considered in my medical decision making (see chart for details).    Reviewed narcotics database.  Patient had last Rx filled 08/2016 for clonazepam by provider in [place].  Patient states he lives in [place].  Discussed pt presentation, exam findings, and workup with Dr. Criss Alvine, who personally evaluated the patient and agrees that patient's presentation does not seem consistent with acute seizure given patient's memory of the event. His neuro exam revealed some inconsistencies as well with  symmetric strength with shoulder abduction/adduction and facial numbness crossing midline. Discussed neg head CT and neuro recommendations, and agrees that further inpt workup not necessary from neuro standpoint. Agrees that if remainder of laboratory work is benign, and TTS clears patient, then he is safe for discharge with outpatient follow-up with neurology.  Tele neuro was consulted by Dr. Criss Alvine who spoke with Dr. Idell Pickles. Dr. Idell Pickles evaluated the patient, and recommended giving the patient an ambulatory referral to neurology with plan to schedule outpatient MRI and EEG, unless he returns to ER with similar sxs. In that case recommend admission for eeg/mri.   Final Clinical Impressions(s) / ED Diagnoses   Final diagnoses:  None   62 year old male presents the ED today with reported seizure occurred prior to arrival.   Patient's vital signs stable, mildly hypertensive however do not suspect hypertensive urgency/emergency, given no evidence of endorgan damage at this time.  Blood sugar normal after supposed seizure.  Was also normal here on labs.  Given patient's description of events today, and his reports that he remembers the episode, this is less likely a seizure.  Doubt CVA with negative non-contrast head CT, and inconsistent physical exam findings. No aphasia, no visual field cuts, no facial droop or pronator  drift.  neurology note suggested that patient apparently had lack of postictal state.  Patient will have so had no oral trauma or bowel or bladder incontinence.  Neurology felt that outpatient referral for MRI/EEG would be appropriate.   Doubt syncope as pt did not lose consciousness. doubt cardiac etiology causing sxs as pt states symptoms were triggered after he became very angry/emotional. No cp/sob/palpitations. ECG WNL and no ischemic changes or arrhythmia. [place] syncope rule will likely be low risk, pending trop currently.  Patient also complaining of suicidal ideations during my exam and became very emotional stating that he was she was diet he had no friends.  Reported intermittent HI when someone makes him mad.  No AVH.  Will consult TTS as patient is medically cleared.  CBC at baseline with mild anemia, however improved from past. BMP grossly negative. UA negative. UDS positive for THC and opiates. Acetaminophen and salicylate negative.   Pt cleared for TTS consult.   TTS pending at shift change, and pt signed out to [facility] to f/u on trop and results of TTS consult.   ED Discharge Orders        Ordered    Ambulatory referral to Neurology    Comments:  An appointment is requested in approximately: 2 weeks   04/21/17 1539       Ysenia Filice S, PA-C 04/21/17 2041    Karrie Meres, PA-C 04/21/17 2345    Pricilla Loveless, MD 04/24/17 [phone]

## 2017-04-21 NOTE — ED Notes (Signed)
Pt getting dressed in paper scrubs at this time.

## 2017-04-21 NOTE — ED Triage Notes (Signed)
Per EMS: pt had a witnessed petite mal seizure for 5 minutes at his pharmacy.   Reports from EMS of no incontinence, no oral trauma, no head injury or fall (as pt was sitting in chair).  Vitals were 180/100 initially, then decreased to 140/84.  Blood sugar was 147.  EMS placed a 20 gauge IV in LFA.  Pt pt c/o at this time.

## 2017-04-21 NOTE — ED Notes (Signed)
After pt arrived he began talking about strange things that had nothing to do with Triage. Pt reports to nurse that he is not speaking to his daughter and that he is getting "rid of her trust fund that has over two million dollars in it." When asking if pt was suicidal, pt reported to nursing staff that he was not today, but he has been in the past.  Pt also reports to nursing staff that he has had multiple failed surgeries for his back and that he takes pain medication for that, but the pt states he has to have "something made from God's hands that is like from opium" and that "Genesis speaks of pain being eased from God's creations"

## 2017-04-21 NOTE — Consult Note (Signed)
[facility] Tele[facility] Consult Services   Asked to see this patient in telemedicine consultation. Consultation was performed with assistance of ancillary / medical staff at bedside. ?   Verbal consent to perform the examination with telemedicine was obtained. Patient and family agreed to proceed with the consultation.   Impression: Spells  Unusual spells increasing in frequency  Seems to be a partial loss of awareness but not full loss of consciousness  No B/B incontinence, no oral trauma  Seizure in differential diagnosis but seems less likely  No work-up in the past, no past eval by neurology   Differential Diagnosis:   Seizure  Syncope  Conversion Disorder   Metrics: STAT neuro consult request  Recommendations:    Outpatient neurology referral for EEG/MRI  No AEDs indicated at this time as spells are poorly characterized  If he returns to ER with recurrence of spells prior to any neuro follow-up, then would advised admission for EEG/MRI at that time  Discussed with ED MD     CC:  Stroke Alert  Date of Consult:  04/21/17    HPI:  Patient to ER today after spell  He refers to these spells as seizure  Ongoing over the last 7-10 years per patient, escalating the last couple of weeks  Typical event with him recalling shaking in arms/legs, loss of awareness without loss of consciousness  Last minutes, then stops  No B/B incontinence ever reported  When asked about post-ictal symptoms, states he is still confused although very appropriate in his verbal responses and interactions  When asked states never seen by neurology and no EEG/MRI in the past  No past use of any AEDs  Numbness in hands and feet bilaterally worse since event this morning, 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 Cluster headaches    entered in Seroquel study at [facility] 9-09  . Colon polyps    5 tubular adenomas on 11-06-11  . Degenerative disc disease, thoracic   . Low back pain    from Spinal stenosis  . Neck pain    from cervical spondylosis  . Other social stressor    with depression  . Pure hypercholesterolemia   . Seizures (HCC)     Prior to Admission medications   Medication Sig Start Date End Date Taking? Authorizing Provider  cholecalciferol (VITAMIN D) 400 UNITS TABS tablet Take 400 Units by mouth. Taking 3 capsules   Yes [provider]  clonazePAM (KLONOPIN) 0.5 MG tablet Take 0.5 mg by mouth 3 (three) times daily as needed for anxiety.    Yes [provider]  MORPHINE SULFATE PO Take 30 mg by mouth as needed.   Yes [provider]  omeprazole (PRILOSEC) 20 MG capsule Take 40 mg by mouth daily.   Yes [provider]  promethazine (PHENERGAN) 25 MG tablet Take 25 mg by mouth every 8 (eight) hours as needed for nausea.  04/05/08  Yes [provider]  diclofenac (VOLTAREN) 75 MG EC tablet Take 1 tablet (75 mg total) by mouth 2 (two) times daily. Patient not taking: Reported on 04/21/2017 02/03/17   Mardella Layman, MD     Allergies  Allergen Reactions  . Codeine     headaches  . Hydrocodone Bitartrate Er     headaches  . Lyrica [Pregabalin]   .  Methadone Hcl   . Nucynta [Tapentadol]   . Sansert [Methysergide]   . Xanax [Alprazolam]     sedation    Social History   Tobacco Use  . Smoking status: Current Every Day Smoker    Types: Cigars  . Smokeless tobacco: Never Used  Substance Use Topics  . Alcohol use: Yes  . Drug use: Yes    Types: Marijuana    No family history on file.      Physical Exam  Vitals:   04/21/17 1313  BP: (!) 150/84  Pulse: 65  Resp: 14  SpO2: 98%     NIHSS  LOC - 0  LOC questions - 0  LOC commands - 0  EOM - 0  VF - 0  Face - 0  Motor Upper Ext -  0  Motor Lower Ext - 0  Coordination - 0  Sensory - 1  Language - 0  Speech - 0  Extinction - 0  NIHSS total - 1   Lab/Data Review  CT Head - reviewed - no acute process      Medical Decision Making:  - Extensive number of diagnosis or management options are considered above.   - Extensive amount of complex data reviewed.   - High risk of complication and/or morbidity or mortality are associated with differential diagnostic considerations above.  - There may be Uncertain outcome and increased probability of prolonged functional impairment or high probability of severe prolonged functional impairment associated with some of these differential diagnosis.  Medical Data Reviewed:  1.Data reviewed include clinical labs, radiology,  Medical Tests;   2.Tests results discussed w/performing or interpreting physician;   3.Obtaining/reviewing old medical records;  4.Obtaining case history from another source;  5.Independent review of image, tracing or specimen.

## 2017-04-21 NOTE — ED Notes (Addendum)
Pt's belongings of clothes, shoes, and keys placed in locker 28.  Pt's belongings placed in a locked safe including [employer] 1043$ cash 1 [employer] with Credit cards x3 and Drivers license

## 2017-04-21 NOTE — ED Notes (Signed)
Teleneurology computer at bedside
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
## 2017-04-21 NOTE — ED Notes (Signed)
Bed: WA06 Expected date:  Expected time:  Means of arrival:  Comments: Held/EMS absent seizure

## 2017-04-22 DIAGNOSIS — R45851 Suicidal ideations: Secondary | ICD-10-CM

## 2017-04-22 DIAGNOSIS — R451 Restlessness and agitation: Secondary | ICD-10-CM

## 2017-04-22 DIAGNOSIS — F17290 Nicotine dependence, other tobacco product, uncomplicated: Secondary | ICD-10-CM

## 2017-04-22 DIAGNOSIS — F1290 Cannabis use, unspecified, uncomplicated: Secondary | ICD-10-CM

## 2017-04-22 DIAGNOSIS — R454 Irritability and anger: Secondary | ICD-10-CM

## 2017-04-22 DIAGNOSIS — F4325 Adjustment disorder with mixed disturbance of emotions and conduct: Secondary | ICD-10-CM

## 2017-04-22 MED ORDER — ZIPRASIDONE MESYLATE 20 MG IM SOLR: 10.0000 mg | Freq: Once | INTRAMUSCULAR | Status: DC

## 2017-04-22 NOTE — [facility] Suicide Risk Assessment (Signed)
Suicide Risk Assessment  Discharge Assessment   [facility]   Principal Problem: Adjustment disorder with mixed disturbance of emotions and conduct Discharge Diagnoses:  Patient Active Problem List   Diagnosis Date Noted  . Adjustment disorder with mixed disturbance of emotions and conduct [F43.25] 04/22/2017    Priority: High    Total Time spent with patient: 45 minutes   Musculoskeletal: Strength & Muscle Tone: within normal limits Gait & Station: normal Patient leans: N/A  Psychiatric Specialty Exam: Physical Exam  Constitutional: He is oriented to person, place, and time. He appears well-developed and well-nourished.  HENT:  Head: Normocephalic.  Neurological: He is alert and oriented to person, place, and time. He has normal strength.  Skin: Skin is warm and dry.  Psychiatric: His speech is normal. Judgment and thought content normal. His affect is angry. He is agitated. Cognition and memory are normal.    Review of Systems  All other systems reviewed and are negative.   Blood pressure 133/76, pulse 68, temperature 98.1 F (36.7 C), temperature source Oral, resp. rate 16, SpO2 99 %.There is no height or weight on file to calculate BMI.  General Appearance: Casual  Eye Contact:  Poor  Speech:  Clear and Coherent  Volume:  Normal  Mood:  Angry  Affect:  Congruent  Thought Process:  Coherent  Orientation:  Full (Time, Place, and Person)  Thought Content:  Logical  Suicidal Thoughts:  No  Homicidal Thoughts:  No  Memory:  Immediate;   Good Recent;   Good Remote;   Good  Judgement:  Fair  Insight:  Fair  Psychomotor Activity:  Normal  Concentration:  Concentration: Good and Attention Span: Good  Recall:  Good  Fund of Knowledge:  Good  Language:  Good  Akathisia:  No  Handed:  Right  AIMS (if indicated):     Assets:  Communication Skills Physical Health Resilience  ADL's:  Intact  Cognition:  WNL  Sleep:      Mental Status  Per Nursing Assessment::   On Admission:   suicidal ideations  Demographic Factors:  Male, caucasian  Loss Factors: NA  Historical Factors: NA  Risk Reduction Factors:   Sense of responsibility to family and Living with another person, especially a relative  Continued Clinical Symptoms:  Irritable   Cognitive Features That Contribute To Risk:  None    Suicide Risk:  Minimal: No identifiable suicidal ideation.  Patients presenting with no risk factors but with morbid ruminations; may be classified as minimal risk based on the severity of the depressive symptoms  Follow-up Information    [facility]. Schedule an appointment as soon as possible for a visit in 2 weeks.   Contact information: [address], [address] [place] [id number] [phone]       [facility]-EMERGENCY DEPT.   Specialty:  Emergency Medicine Why:  Return to the ER with any new or worsening symptoms Contact information: [address] [id number] [place] [id number] [phone]          Plan Of Care/Follow-up recommendations:  Activity:  as tolerated Diet:  heart healthy diet  Johathon Overturf, NP 04/22/2017, 4:15 PM
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
## 2017-04-22 NOTE — BH Assessment (Signed)
[facility] Assessment Progress Note  Per Jacqueline Norman, DO, this pt does not require psychiatric hospitalization at this time.  [facility].  No outpatient referrals are necessary.  Pt's nurse, Randi, has been notified.  Omarius Grantham, MA Triage Specialist [phone]     

## 2017-04-22 NOTE — Consult Note (Addendum)
[facility] Consult   Reason for Consult:  Suicidal ideations Referring Physician:  EDP Patient Identification: Martin Singleton MRN:  [id number] Principal Diagnosis: Adjustment disorder with mixed disturbance of emotions and conduct Diagnosis:   Patient Active Problem List   Diagnosis Date Noted  . Adjustment disorder with mixed disturbance of emotions and conduct [F43.25] 04/22/2017    Priority: High    Total Time spent with patient: 45 minutes  HPI:   Martin Singleton is a 62 y.o. male patient admitted to the ED for an evaluation of a recent seizure. During the course of patients medical evaluation he endorsed suicidal ideation. During the course of this evaluation patient notes that "sometimes people piss me off". Patient is unable to endorse any other reasons for having felt suicidal at that time. This morning after having slept last night, patient states that he no longer feels suicidal, denies any thoughts of hurting himself, and feels safe at home. Patient angrily stating that he feels mistreated here because he did not get Morphine like he normally does in the ED and he only received Ativan. When further discussing his frustration about not getting the pain medications that he wants patient states, " I should beat that nurse" while yelling in bed. Patient states that he just wants to go home if he is not getting what he wants. Patient denies auditory or visual hallucinations. Patient denies any other concerns at this time.  Past Psychiatric History:  PTSD Substance Use Disorder  Risk to Self: None  Risk to Others: Homicidal Ideation: No Thoughts of Harm to Others: No Current Homicidal Intent: No Current Homicidal Plan: No Access to Homicidal Means: No Identified Victim: n/a History of harm to others?: No Assessment of Violence: None Noted Violent Behavior Description: none known Does patient have access to weapons?: Yes (Comment)(pt owns many guns ) Criminal  Charges Pending?: No Does patient have a court date: No Prior Inpatient Therapy: Prior Inpatient Therapy: Yes Prior Therapy Dates: weekly  Prior Therapy Facilty/Provider(s): [facility]  Reason for Treatment: mental health  Prior Outpatient Therapy: Prior Outpatient Therapy: No Does patient have an ACCT team?: No Does patient have Intensive In-House Services?  : No Does patient have [facility] services? : No Does patient have P4CC services?: No  Past Medical History:  Past Medical History:  Diagnosis Date  . Cluster headaches    entered in Seroquel study at [facility] 9-09  . Colon polyps    5 tubular adenomas on 11-06-11  . Degenerative disc disease, thoracic   . Low back pain    from Spinal stenosis  . Neck pain    from cervical spondylosis  . Other social stressor    with depression  . Pure hypercholesterolemia   . Seizures ([facility])     Past Surgical History:  Procedure Laterality Date  . COLONOSCOPY     Family History: No family history on file.  Social History:  Social History   Substance and Sexual Activity  Alcohol Use Yes     Social History   Substance and Sexual Activity  Drug Use Yes  . Types: Marijuana    Social History   Socioeconomic History  . Marital status: Divorced    Spouse name: None  . Number of children: None  . Years of education: None  . Highest education level: None  Social Needs  . Financial resource strain: None  . Food insecurity - worry: None  . Food insecurity - inability: None  . Transportation  needs - medical: None  . Transportation needs - non-medical: None  Occupational History  . None  Tobacco Use  . Smoking status: Current Every Day Smoker    Types: Cigars  . Smokeless tobacco: Never Used  Substance and Sexual Activity  . Alcohol use: Yes  . Drug use: Yes    Types: Marijuana  . Sexual activity: None  Other Topics Concern  . None  Social History Narrative  . None   Allergies:   Allergies  Allergen Reactions  .  Codeine     headaches  . Hydrocodone Bitartrate Er     headaches  . Lyrica [Pregabalin]   . Methadone Hcl   . Nucynta [Tapentadol]   . Sansert [Methysergide]   . Xanax [Alprazolam]     sedation    Labs:  Results for orders placed or performed during the hospital encounter of 04/21/17 (from the past 48 hour(s))  CBC     Status: Abnormal   Collection Time: 04/21/17  1:10 PM  Result Value Ref Range   WBC 5.5 4.0 - 10.5 K/uL   RBC 3.89 (L) 4.22 - 5.81 MIL/uL   Hemoglobin 11.4 (L) 13.0 - 17.0 g/dL   HCT 35.6 (L) 39.0 - 52.0 %   MCV 91.5 78.0 - 100.0 fL   MCH 29.3 26.0 - 34.0 pg   MCHC 32.0 30.0 - 36.0 g/dL   RDW 13.9 11.5 - 15.5 %   Platelets 231 150 - 400 K/uL    Comment: Performed at [facility], [place] [address] [place], [place] [address]  Basic metabolic panel     Status: Abnormal   Collection Time: 04/21/17  1:10 PM  Result Value Ref Range   Sodium 139 135 - 145 mmol/L   Potassium 3.6 3.5 - 5.1 mmol/L   Chloride 105 101 - 111 mmol/L   CO2 27 22 - 32 mmol/L   Glucose, Bld 126 (H) 65 - 99 mg/dL   BUN 17 6 - 20 mg/dL   Creatinine, Ser 0.80 0.61 - 1.24 mg/dL   Calcium 8.9 8.9 - 10.3 mg/dL   GFR calc non Af Amer >60 >60 mL/min   GFR calc Af Amer >60 >60 mL/min    Comment: (NOTE) The eGFR has been calculated using the CKD EPI equation. This calculation has not been validated in all clinical situations. eGFR's persistently <60 mL/min signify possible Chronic Kidney Disease.    Anion gap 7 5 - 15    Comment: Performed at [facility], [place] [address], [place], [place] [address]  Urinalysis, Routine w reflex microscopic     Status: None   Collection Time: 04/21/17  1:10 PM  Result Value Ref Range   Color, Urine YELLOW YELLOW   APPearance CLEAR CLEAR   Specific Gravity, Urine 1.017 1.005 - 1.030   pH 5.0 5.0 - 8.0   Glucose, UA NEGATIVE NEGATIVE mg/dL   Hgb urine dipstick NEGATIVE NEGATIVE   Bilirubin Urine NEGATIVE NEGATIVE    Ketones, ur NEGATIVE NEGATIVE mg/dL   Protein, ur NEGATIVE NEGATIVE mg/dL   Nitrite NEGATIVE NEGATIVE   Leukocytes, UA NEGATIVE NEGATIVE    Comment: Performed at [facility] [address] [place], [place] [address]  Rapid urine drug screen (hospital performed)     Status: Abnormal   Collection Time: 04/21/17  1:10 PM  Result Value Ref Range   Opiates POSITIVE (A) NONE DETECTED   Cocaine NONE DETECTED NONE DETECTED   Benzodiazepines NONE DETECTED NONE  DETECTED   Amphetamines NONE DETECTED NONE DETECTED   Tetrahydrocannabinol POSITIVE (A) NONE DETECTED   Barbiturates NONE DETECTED NONE DETECTED    Comment: (NOTE) DRUG SCREEN FOR MEDICAL PURPOSES ONLY.  IF CONFIRMATION IS NEEDED FOR ANY PURPOSE, NOTIFY LAB WITHIN 5 DAYS. LOWEST DETECTABLE LIMITS FOR URINE DRUG SCREEN Drug Class                     Cutoff (ng/mL) Amphetamine and metabolites    1000 Barbiturate and metabolites    200 Benzodiazepine                 353 Tricyclics and metabolites     300 Opiates and metabolites        300 Cocaine and metabolites        300 THC                            50 Performed at [facility], [place] [address], [place], [place] [address]   Acetaminophen level     Status: Abnormal   Collection Time: 04/21/17  1:10 PM  Result Value Ref Range   Acetaminophen (Tylenol), Serum <10 (L) 10 - 30 ug/mL    Comment:        THERAPEUTIC CONCENTRATIONS VARY SIGNIFICANTLY. A RANGE OF 10-30 ug/mL MAY BE AN EFFECTIVE CONCENTRATION FOR MANY PATIENTS. HOWEVER, SOME ARE BEST TREATED AT CONCENTRATIONS OUTSIDE THIS RANGE. ACETAMINOPHEN CONCENTRATIONS >150 ug/mL AT 4 HOURS AFTER INGESTION AND >50 ug/mL AT 12 HOURS AFTER INGESTION ARE OFTEN ASSOCIATED WITH TOXIC REACTIONS. Performed at [facility], [place] [address] [place], [place] [address]   Salicylate level     Status: None   Collection Time: 04/21/17  1:10 PM  Result Value Ref Range    Salicylate Lvl <1.9 2.8 - 30.0 mg/dL    Comment: Performed at [facility], [place] [address], [place], [place] [address]  I-Stat Troponin, ED (not at [facility])     Status: None   Collection Time: 04/21/17  8:00 PM  Result Value Ref Range   Troponin i, poc 0.01 0.00 - 0.08 ng/mL   Comment 3            Comment: Due to the release kinetics of cTnI, a negative result within the first hours of the onset of symptoms does not rule out myocardial infarction with certainty. If myocardial infarction is still suspected, repeat the test at appropriate intervals.     No current facility-administered medications for this encounter.    Current Outpatient Medications  Medication Sig Dispense Refill  . cholecalciferol (VITAMIN D) 400 UNITS TABS tablet Take 400 Units by mouth. Taking 3 capsules    . omeprazole (PRILOSEC) 20 MG capsule Take 40 mg by mouth daily.    . promethazine (PHENERGAN) 25 MG tablet Take 25 mg by mouth every 8 (eight) hours as needed for nausea.       Musculoskeletal: Strength & Muscle Tone: within normal limits Gait & Station: normal Patient leans: N/A  Psychiatric Specialty Exam: Physical Exam  Nursing note and vitals reviewed. Constitutional: He is oriented to person, place, and time. He appears well-developed and well-nourished.  HENT:  Head: Normocephalic and atraumatic.  Neck: Normal range of motion.  Respiratory: Effort normal.  Musculoskeletal: Normal range of motion.  Neurological: He is alert and oriented to person, place, and time. He has normal strength.  Skin: Skin is warm and dry.  Psychiatric: His speech is normal. Judgment and thought content normal. His affect is angry. He is agitated. Cognition and memory are normal.    Review of Systems  All other systems reviewed and are negative.   Blood pressure 133/76, pulse 68, temperature 98.1 F (36.7 C), temperature source Oral, resp. rate 16, SpO2 99 %.There is no height or weight on file to  calculate BMI.  General Appearance: Casual  Eye Contact:  Poor  Speech:  Clear and Coherent  Volume:  Normal  Mood:  Angry  Affect:  Congruent  Thought Process:  Coherent  Orientation:  Full (Time, Place, and Person)  Thought Content:  Logical  Suicidal Thoughts:  No  Homicidal Thoughts:  No  Memory:  Immediate;   Good Recent;   Good Remote;   Good  Judgement:  Poor  Insight:  Fair  Psychomotor Activity:  Normal  Concentration:  Concentration: Good and Attention Span: Good  Recall:  Good  Fund of Knowledge:  Good  Language:  Good  Akathisia:  No  Handed:  Right  AIMS (if indicated):   N/A  Assets:  Communication Skills Physical Health Resilience  ADL's:  Intact  Cognition:  WNL  Sleep:   N/A     Treatment Plan Summary: Daily contact with patient to assess and evaluate symptoms and progress in treatment, Medication management and Plan adjustment disorder with disturbance of emotions and conduct:  -Crisis stabilization -Medication management:  Medical medications continued  -Individual counseling  Disposition: No evidence of imminent risk to self or others at present.   Patient does not meet criteria for psychiatric inpatient admission.  Waylan Boga, NP 04/22/2017 3:41 PM   Patient seen face-to-face for psychiatric evaluation, chart reviewed and case discussed with the physician extender and developed treatment plan. Reviewed the information documented and agree with the treatment plan.  Buford Dresser, DO 04/22/17 5:18 PM
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
## 2017-04-22 NOTE — ED Notes (Addendum)
Martin Singleton again observed crying states remorse about current relationship with daughter and family. Also request pain medication morphine but prefers "[name]". "If I can't  have morphine I want weed". Intermittant verbal outburst with yelling at staff but was redirectable. Copy of unit rules given to Martin Singleton. Profanity noted.

## 2017-05-07 ENCOUNTER — Encounter: Payer: Self-pay | Admitting: Neurology

## 2017-05-13 ENCOUNTER — Ambulatory Visit (HOSPITAL_BASED_OUTPATIENT_CLINIC_OR_DEPARTMENT_OTHER): Payer: Self-pay | Admitting: Family Medicine

## 2017-05-13 ENCOUNTER — Ambulatory Visit (HOSPITAL_BASED_OUTPATIENT_CLINIC_OR_DEPARTMENT_OTHER): Payer: Self-pay | Admitting: Orthopaedic Surgery

## 2017-05-13 DIAGNOSIS — Z1211 Encounter for screening for malignant neoplasm of colon: Secondary | ICD-10-CM

## 2017-05-13 NOTE — Telephone Encounter (Signed)
Regarding: Advise  ----- Message from Laurann Montana sent at 05/13/2017 11:58 AM EDT -----  Rosemarie Beath, MD    Hello!  The pt would like to speak with a nurse about his overdue health maintenance issues. Please advise the pt at [phone]    Thanks!  Laurann Montana  05/13/2017, 11:56

## 2017-05-18 ENCOUNTER — Encounter (HOSPITAL_BASED_OUTPATIENT_CLINIC_OR_DEPARTMENT_OTHER): Payer: Self-pay

## 2017-05-18 ENCOUNTER — Ambulatory Visit
Admission: RE | Admit: 2017-05-18 | Discharge: 2017-05-18 | Disposition: A | Discharge status: Home / Self Care | Payer: No Typology Code available for payment source | Source: Ambulatory Visit | Attending: Orthopaedic Surgery | Admitting: Orthopaedic Surgery

## 2017-05-18 ENCOUNTER — Ambulatory Visit (HOSPITAL_BASED_OUTPATIENT_CLINIC_OR_DEPARTMENT_OTHER): Payer: No Typology Code available for payment source | Admitting: Orthopaedic Surgery

## 2017-05-18 ENCOUNTER — Encounter (HOSPITAL_BASED_OUTPATIENT_CLINIC_OR_DEPARTMENT_OTHER): Payer: Self-pay | Admitting: Orthopaedic Surgery

## 2017-05-18 VITALS — BP 174/96 | HR 59 | Temp 97.3°F | Ht 69.49 in | Wt 294.3 lb

## 2017-05-18 DIAGNOSIS — M25552 Pain in left hip: Secondary | ICD-10-CM

## 2017-05-18 DIAGNOSIS — M25559 Pain in unspecified hip: Secondary | ICD-10-CM | POA: Insufficient documentation

## 2017-05-18 DIAGNOSIS — I8392 Asymptomatic varicose veins of left lower extremity: Secondary | ICD-10-CM | POA: Insufficient documentation

## 2017-05-18 DIAGNOSIS — M1612 Unilateral primary osteoarthritis, left hip: Secondary | ICD-10-CM | POA: Insufficient documentation

## 2017-05-18 DIAGNOSIS — I8393 Asymptomatic varicose veins of bilateral lower extremities: Secondary | ICD-10-CM

## 2017-05-18 NOTE — Progress Notes (Signed)
[facility]    The patient returns today with worsening left hip pain.  With respect to the previous problem, he is worse.  He rates the pain a 8/10.    Review of Systems:  The patient denies recent change in health    The ROS is positive for weight gain, negative for fevers, chills, sweats, fatigue, malaise, anorexia and weight loss    The remainder of the review of systems is negative    Exam  BP (!) 174/96   Pulse 59   Temp 36.3 C (97.3 F) (Thermal Scan)   Ht 1.765 m (5' 9.49")   Wt 133.5 kg (294 lb 5 oz)   BMI 42.85 kg/m       General:  well nourished, well developed, non acute distress  Gait:  Trendelenberg  HEENT:  Head: Normocephalic, no lesions, without obvious abnormality.  Respirations:  Normal/Unlabored  Reflexes:  not tested  Lower Extremity Motor Strength: grossly normal  Wound:  none  Venous varicosities  Left hip range of motion as follows:  Flexion - 70  Internal rotation - 0  External rotation - 60  Abduction - 20  Adduction - 5      X-rays:  Today's images of the bilateral hip reveal moderate DJD left hip as evidenced by joint space narrowing and bony sclerosis.  He has a well fixed right THA.    Impression:   ENCOUNTER DIAGNOSES     ICD-10-CM   1. Hip pain M25.559       Plan:   He has had a number of injections left hip  We will order another injection left hip as he loses weight  Target weight 270  He will follow up  for pre-op visit once he reaches his target weight  Radiographs needed at next visit:  None  He was encouraged to call or e-mail with questions or concerns    Jacqualine Code, MD  05/18/2017, 12:45        [place], [place]  [place] [address] [place] [address]    [place], [place], [place]  [place], [place] [address]

## 2017-05-20 ENCOUNTER — Other Ambulatory Visit (HOSPITAL_COMMUNITY): Payer: Self-pay | Admitting: Radiation Oncology

## 2017-05-20 DIAGNOSIS — C61 Malignant neoplasm of prostate: Secondary | ICD-10-CM

## 2017-05-23 ENCOUNTER — Encounter (HOSPITAL_COMMUNITY): Payer: Self-pay | Admitting: *Deleted

## 2017-05-23 ENCOUNTER — Ambulatory Visit (INDEPENDENT_AMBULATORY_CARE_PROVIDER_SITE_OTHER): Payer: Medicare Other

## 2017-05-23 ENCOUNTER — Ambulatory Visit (HOSPITAL_COMMUNITY)
Admission: EM | Admit: 2017-05-23 | Discharge: 2017-05-23 | Disposition: A | Discharge status: Home / Self Care | Payer: Medicare Other | Attending: Internal Medicine | Admitting: Internal Medicine

## 2017-05-23 DIAGNOSIS — S52614A Nondisplaced fracture of right ulna styloid process, initial encounter for closed fracture: Secondary | ICD-10-CM

## 2017-05-23 NOTE — Discharge Instructions (Signed)
Please wear splint at all times. Ice and elevation for pain control. May use previously prescribed morphine as needed for pain control. Please call and schedule follow up with orthopedics for definitive treatment.
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
## 2017-05-24 ENCOUNTER — Other Ambulatory Visit (HOSPITAL_COMMUNITY): Payer: Self-pay | Admitting: Radiation Oncology

## 2017-05-24 DIAGNOSIS — Z09 Encounter for follow-up examination after completed treatment for conditions other than malignant neoplasm: Secondary | ICD-10-CM

## 2017-05-24 DIAGNOSIS — C61 Malignant neoplasm of prostate: Secondary | ICD-10-CM

## 2017-05-24 DIAGNOSIS — Z789 Other specified health status: Secondary | ICD-10-CM

## 2017-05-25 ENCOUNTER — Encounter (HOSPITAL_COMMUNITY): Payer: Self-pay | Admitting: Radiation Oncology

## 2017-05-25 ENCOUNTER — Inpatient Hospital Stay
Admission: RE | Admit: 2017-05-25 | Discharge: 2017-05-25 | Disposition: A | Discharge status: Still In Hospital | Payer: No Typology Code available for payment source | Source: Ambulatory Visit | Attending: Radiation Oncology | Admitting: Radiation Oncology

## 2017-05-25 ENCOUNTER — Other Ambulatory Visit (HOSPITAL_BASED_OUTPATIENT_CLINIC_OR_DEPARTMENT_OTHER): Payer: Self-pay | Admitting: Orthopaedic Surgery

## 2017-05-25 DIAGNOSIS — Z789 Other specified health status: Secondary | ICD-10-CM

## 2017-05-25 DIAGNOSIS — Z923 Personal history of irradiation: Secondary | ICD-10-CM | POA: Insufficient documentation

## 2017-05-25 DIAGNOSIS — Z882 Allergy status to sulfonamides status: Secondary | ICD-10-CM | POA: Insufficient documentation

## 2017-05-25 DIAGNOSIS — C61 Malignant neoplasm of prostate: Secondary | ICD-10-CM | POA: Insufficient documentation

## 2017-05-25 DIAGNOSIS — Z09 Encounter for follow-up examination after completed treatment for conditions other than malignant neoplasm: Secondary | ICD-10-CM

## 2017-05-25 DIAGNOSIS — Z08 Encounter for follow-up examination after completed treatment for malignant neoplasm: Secondary | ICD-10-CM

## 2017-05-25 DIAGNOSIS — Z8546 Personal history of malignant neoplasm of prostate: Secondary | ICD-10-CM

## 2017-05-25 DIAGNOSIS — Z79899 Other long term (current) drug therapy: Secondary | ICD-10-CM | POA: Insufficient documentation

## 2017-05-25 DIAGNOSIS — M25559 Pain in unspecified hip: Secondary | ICD-10-CM

## 2017-05-25 LAB — PSA, DIAGNOSTIC: PSA: 0.4 ng/mL (ref <=4.0)

## 2017-05-25 NOTE — Progress Notes (Signed)
RTOG 0924 - Pt is in today for 12 month follow up.  Pt states that he is feeling good.  AEs reviewed with patient and he still has ongoing gr 1 L hip pain.  He recently saw his orthopedic dr who is now recommending replacement.  This will continue to be managed by ortho.  Pt denies any new problems.  Denies urinary urgency, burning or pain when urinating or increased nocturia.  Pt denies any new conmed use. PSA was drawn today per patient request, though not requested by protocol.  Results still pending at time of this note, but will be contacted if there is any sign of biochemical recurrence.      Dr. Lenore Manner in to see patient for physical exam and DRE.  Dr. Lenore Manner feels there is no indication today for CBC or radiographic assessment.  Next appt to be scheduled for 6 months as per protocol.  PSA to be drawn at that time.  Pt has my contact and clinic information and knows to call with any concerns or questions.  Lorn Junes, RN

## 2017-05-25 NOTE — Cancer Center Note (Addendum)
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 Since treatment his frequency and obstructive urinary symptoms have resolved. Bowel movements are also back to normal. His PSA increased slightly from 0.2 to 0.3 on 02/23/17 and 0.4 on 05/25/17. He had some numbness in his hands thought to be due to carpal tunnel syndrome which has now resolved with physical therapy. He denies any other symptoms.      Medications:  Current Outpatient Medications   Medication Sig   . atorvastatin (LIPITOR) 20 mg Oral Tablet Take 1 Tab (20 mg total) by mouth Every evening   . diclofenac sodium (VOLTAREN) 75 mg Oral Tablet, Delayed Release (E.C.) Take 1 Tab (75 mg total) by mouth Twice daily   . dicyclomine (BENTYL) 20 mg Oral Tablet Take 1 Tab (20 mg total) by mouth Four times a day   . Ferrous Sulfate (SLOW RELEASE IRON) 142 mg (45 mg iron) Oral Tablet Sustained Release Take 1 Tab (142 mg total) by mouth Three times a day as needed   . GLUCOS CHOND CPLX ADVANCED ORAL qd   . metoprolol tartrate (LOPRESSOR) 25 mg Oral Tablet Take 1 Tab (25 mg total) by mouth Twice daily   . metroNIDAZOLE (METROGEL) 1 % Gel with Pump by Apply externally route Once a day   . multivitamin Oral Tablet Take 1 Tab by mouth Once a day   . Omeprazole 20 mg Oral Tablet, Delayed Release (E.C.) take  by mouth.       Allergies   Allergen Reactions   . Sulfa (Sulfonamides) Rash   . Shellfish Containing Products Itching and Swelling      Shrimp - eye swelling, itching       Review of Systems:  Constitutional:  negative for fevers, chills, anorexia, fatigue and weight loss  Eyes:  negative for visual disturbance and irritation  Ears, nose, mouth, throat:  negative for hearing loss, nasal congestion, sore throat, hoarseness and voice change  Cardiac/vascular:  negative for chest pain, palpitations and lower extremity edema  Respiratory:  negative for hemoptysis, pleurisy/chest pain, cough and dyspnea  Gastrointestinal:  negative for dysphagia, nausea, vomiting, melena, diarrhea and abdominal pain  Genitourinary:  negative for dysuria, urinary incontinence and hematuria  Musculoskeletal:  negative for myalgias, muscle weakness, neck pain, back pain and hip pain   Neurological:  negative for seizures, speech problems, gait problems, dizziness and headaches  Behavioral/Psych:  negative for behavior problems  Hematologic/lymphatic:  negative for bleeding and lymphadenopathy  Integumentary (skin, breast):  negative for rash, skin lesion(s), pruritus and dryness  Endocrine:  negative  Allergic/Immunologic:  negative      Objective:     Vitals:    05/25/17 0834   BP: (!) 159/91   Pulse: 94   Temp: 36.6 C (97.9 F)   SpO2: 96%   Weight: 133.9 kg (295 lb 3.1 oz)     Pain Score (  Numeric, Faces): 0  CONSTITUTIONAL: No acute distress, ECOG (1) Restricted in physically strenuous activity, ambulatory and able to do work of light nature  EYES: conjunctiva clear, pupils equal round and reactive to light  ENT: Normocephalic, atraumatic, moist oral cavity mucous membranes  NECK: No masses or palpable lymphadenopathy  RESPIRATORY: Clear to auscultation bilaterally  CARDIOVASCULAR:Regular rate and rhythm, no murmurs appreciated.  GASTROINTESTINAL: Soft, non-tender, non-distended, no palpable hepatosplenomegaly or masses.    GENITOURINARY:  No palpable prostate nodularity  MUSCULOSKELETAL:  No tenderness to palpation in joints or spine.    NEUROLOGIC: AOx3, cranial  nerves II-XII intact, grossly normal motor and sensory exams  LYMPHATICS: No palpable lymphadenopathy  PSYCHIATRIC: Pleasant, normal affect    LABS/IMAGING: All relevant labs and imaging were reviewed as per HPI.     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 There is no clinical or biochemical evidence of recurrence. He will follow-up in58month with repeat PSA. I personally saw and examined the patient, and reviewed all recent findings with him.  I spent greater than 50% of a 15 minute visit in discussion of the patient's diagnosis and management.    MRexanne [name] Henry Manner MD 05/25/2017, 08:42

## 2017-05-27 DIAGNOSIS — M25531 Pain in right wrist: Secondary | ICD-10-CM | POA: Diagnosis not present

## 2017-05-27 NOTE — Telephone Encounter (Signed)
Regarding: Request  ----- Message from Henry [name] sent at 05/27/2017  1:19 PM EDT -----  Rosemarie Beath, MD    Pt requesting to get a colonoscopy order put into his chart.    Thank You

## 2017-05-27 NOTE — Telephone Encounter (Signed)
Orders pended. Message to PCP for review/signature.   Message to scheduling to make RPV for Physical .Wayland Salinas, RN

## 2017-06-08 ENCOUNTER — Ambulatory Visit (HOSPITAL_COMMUNITY): Payer: No Typology Code available for payment source | Admitting: Nuclear Radiology

## 2017-06-08 ENCOUNTER — Encounter (HOSPITAL_COMMUNITY)
Admission: RE | Disposition: A | Discharge status: Home / Self Care | Payer: Self-pay | Source: Ambulatory Visit | Attending: Nuclear Radiology

## 2017-06-08 ENCOUNTER — Ambulatory Visit
Admission: RE | Admit: 2017-06-08 | Discharge: 2017-06-08 | Disposition: A | Discharge status: Home / Self Care | Payer: No Typology Code available for payment source | Source: Ambulatory Visit | Attending: Nuclear Radiology | Admitting: Nuclear Radiology

## 2017-06-08 DIAGNOSIS — M25559 Pain in unspecified hip: Secondary | ICD-10-CM | POA: Insufficient documentation

## 2017-06-08 DIAGNOSIS — M25552 Pain in left hip: Secondary | ICD-10-CM

## 2017-06-08 DIAGNOSIS — M545 Low back pain: Secondary | ICD-10-CM | POA: Insufficient documentation

## 2017-06-08 DIAGNOSIS — M1612 Unilateral primary osteoarthritis, left hip: Secondary | ICD-10-CM | POA: Insufficient documentation

## 2017-06-08 DIAGNOSIS — M25852 Other specified joint disorders, left hip: Secondary | ICD-10-CM | POA: Insufficient documentation

## 2017-06-08 DIAGNOSIS — G8929 Other chronic pain: Secondary | ICD-10-CM | POA: Insufficient documentation

## 2017-06-08 SURGERY — IR ARTHROGRAM - HIP LEFT
Laterality: Left

## 2017-06-08 MED ORDER — TRIAMCINOLONE ACETONIDE 40 MG/ML SUSPENSION FOR INJECTION
INTRAMUSCULAR | Status: AC
Start: 2017-06-08 — End: 2017-06-08
  Filled 2017-06-08: qty 5

## 2017-06-08 MED ORDER — ROPIVACAINE (PF) 5 MG/ML (0.5 %) INJECTION SOLUTION
4.0000 mL | Freq: Once | INTRAMUSCULAR | Status: AC
Start: 2017-06-08 — End: 2017-06-08
  Administered 2017-06-08: 4 mL via INTRA_ARTICULAR

## 2017-06-08 MED ORDER — TRIAMCINOLONE ACETONIDE 40 MG/ML SUSPENSION FOR INJECTION
40.0000 mg | Freq: Once | INTRAMUSCULAR | Status: AC
Start: 2017-06-08 — End: 2017-06-08
  Administered 2017-06-08: 40 mg via INTRA_ARTICULAR

## 2017-06-08 MED ORDER — ROPIVACAINE (PF) 5 MG/ML (0.5 %) INJECTION SOLUTION
4.0000 mL | Freq: Once | INTRAMUSCULAR | Status: AC
Start: 2017-06-08 — End: 2017-06-08
  Administered 2017-06-08: 4 mL

## 2017-06-08 MED ORDER — ROPIVACAINE (PF) 5 MG/ML (0.5 %) INJECTION SOLUTION
INTRAMUSCULAR | Status: AC
Start: 2017-06-08 — End: 2017-06-08
  Filled 2017-06-08: qty 30

## 2017-06-08 MED ORDER — IOVERSOL 320 MG IODINE/ML INTRAVENOUS SOLUTION
3.00 mL | INTRAVENOUS | Status: AC
Start: 2017-06-08 — End: 2017-06-08
  Administered 2017-06-08: 3 mL via INTRAMUSCULAR

## 2017-06-08 MED ADMIN — nystatin 100,000 unit/gram topical powder: @ 10:00:00 | NDC 00574200815

## 2017-06-08 SURGICAL SUPPLY — 3 items
CONV USE 316258 - TRAY MYLGR CLR 3.5IN 22GA SFTM_YL STD HUB SPINAL NEEDLE (KITS & TRAYS (DISPOSABLE)) ×2 IMPLANT
NEEDLE SPINAL YW 6IN 20GA QUINCKE LONG LGTH REG WL POLYPROP QUINCKE TIP STRL LF  DISP (ANETHESIA SUPPLIES) ×1 IMPLANT
NEEDLE SPINAL YW 6IN 20GA QUIN_CKE LONG LGTH REG WL POLYPROP (ANETHESIA SUPPLIES) ×1

## 2017-06-15 DIAGNOSIS — M25531 Pain in right wrist: Secondary | ICD-10-CM | POA: Diagnosis not present

## 2017-06-22 ENCOUNTER — Other Ambulatory Visit (HOSPITAL_BASED_OUTPATIENT_CLINIC_OR_DEPARTMENT_OTHER): Payer: Self-pay | Admitting: Surgical

## 2017-06-22 ENCOUNTER — Telehealth (HOSPITAL_BASED_OUTPATIENT_CLINIC_OR_DEPARTMENT_OTHER): Payer: Self-pay | Admitting: Surgical

## 2017-06-22 MED ORDER — TRAMADOL 50 MG TABLET
50.00 mg | ORAL_TABLET | Freq: Four times a day (QID) | ORAL | 0 refills | Status: DC | PRN
Start: 2017-06-22 — End: 2018-10-07

## 2017-06-22 NOTE — Telephone Encounter (Signed)
increased pain    Caryl Comes pt-    Pt states last Thursday he started having pain in his left hip and it got increasingly worse on Saturday.  He states it is a little better, but usually by the end of the day it gets worse.  He cannot walk very far without it getting worse.  States he got an injection in his left hip about two weeks ago, until this started the injection was working great.  He is wanting to know what to do?        Spoke with patient. He is still having groin pain, but is now experiencing burning in the anterior thigh radiating down the shin. 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 I will phone in tramadol for him, and we can try PT if his symptoms don't subside within the next few days. I have offered to see him in clinic and update x-rays, but he states he has found sciatic stretches and exercises on the internet and would like to try these first. He will call us Friday and let us know how he is doing.   Domingo Sep, PA-C  06/22/2017, 08:34

## 2017-06-25 ENCOUNTER — Telehealth (HOSPITAL_BASED_OUTPATIENT_CLINIC_OR_DEPARTMENT_OTHER): Payer: Self-pay | Admitting: Surgical

## 2017-06-25 NOTE — Telephone Encounter (Signed)
increased pain    Pt was returning call to Desma Paganini he is still having issues the meds are making it more bearable but not able to walk far   The further into the day the worse it is getting.  Please return the call  Thanks        Spoke with patient. He states the burning pain in his hip and leg is no better. Tramadol helps the pain, and the sciatic stretches he found on the internet did not help. We will see him in clinic Tuesday at 9am to update hip x-rays and also a lumbar series. He is agreeable.  Domingo Sep, PA-C  06/25/2017, 12:13

## 2017-06-29 ENCOUNTER — Ambulatory Visit
Admission: RE | Admit: 2017-06-29 | Discharge: 2017-06-29 | Disposition: A | Discharge status: Home / Self Care | Payer: No Typology Code available for payment source | Source: Ambulatory Visit | Attending: Orthopaedic Surgery | Admitting: Orthopaedic Surgery

## 2017-06-29 ENCOUNTER — Ambulatory Visit (HOSPITAL_BASED_OUTPATIENT_CLINIC_OR_DEPARTMENT_OTHER): Payer: No Typology Code available for payment source | Admitting: Orthopaedic Surgery

## 2017-06-29 ENCOUNTER — Ambulatory Visit (HOSPITAL_BASED_OUTPATIENT_CLINIC_OR_DEPARTMENT_OTHER): Payer: No Typology Code available for payment source | Admitting: Radiology

## 2017-06-29 VITALS — BP 189/106 | HR 65 | Temp 97.7°F | Ht 70.08 in | Wt 288.8 lb

## 2017-06-29 DIAGNOSIS — M1612 Unilateral primary osteoarthritis, left hip: Secondary | ICD-10-CM

## 2017-06-29 DIAGNOSIS — M4316 Spondylolisthesis, lumbar region: Secondary | ICD-10-CM

## 2017-06-29 DIAGNOSIS — Z6841 Body Mass Index (BMI) 40.0 and over, adult: Secondary | ICD-10-CM | POA: Insufficient documentation

## 2017-06-29 DIAGNOSIS — M25552 Pain in left hip: Secondary | ICD-10-CM

## 2017-06-29 DIAGNOSIS — Z96641 Presence of right artificial hip joint: Secondary | ICD-10-CM

## 2017-06-29 DIAGNOSIS — M25559 Pain in unspecified hip: Secondary | ICD-10-CM

## 2017-06-29 DIAGNOSIS — M5410 Radiculopathy, site unspecified: Secondary | ICD-10-CM | POA: Insufficient documentation

## 2017-06-29 DIAGNOSIS — Z471 Aftercare following joint replacement surgery: Secondary | ICD-10-CM | POA: Insufficient documentation

## 2017-06-29 DIAGNOSIS — Z96643 Presence of artificial hip joint, bilateral: Secondary | ICD-10-CM | POA: Insufficient documentation

## 2017-06-29 NOTE — Progress Notes (Signed)
[facility] for Joint Replacement  Patient returns after left hip intra-articular corticosteroid injection   The pain improvement was dramatic  However he was unable to go to work last week  He had pain that radiates around the posterior hip and lateral thigh  Pain shoots down the left leg to the anterior shin  Left foot feels tingly    Review of systems  Denies fever, chills, sweats  Denies chest pain and shortness of breath  Remainder of the review of systems is negative    Exam  Well appearing and no apparent distress  BP (!) 189/106   Pulse 65   Temp 36.5 C (97.7 F)   Ht 1.78 m (5' 10.08")   Wt 131 kg (288 lb 12.8 oz)   BMI 41.35 kg/m   Some left groin pain with FADIR  Able to forward bend and straighten without catching  5/5 lower ext strength  Very tender midline lumbosacral spine at L4-S1  Central obesity    Imaging: Today's images of the bilateral hip reveals a well aligned well fixed right THA and moderate DJD left hip as evidenced by joint space narrowing and bony sclerosis.     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   Left leg radicular pain    Plan  PT for radicular pain and listhesis   Needs to work on core strength and flexibility  Weight loss suggested  No return to work for now - he can return to work when he starts feeling better  No plan for left total hip replacement at this time  Arthritis needs to be work before we replace the left hip AND he needs to be lighter  Follow up 8 weeks for repeat check  MRI if left leg radicular pain is no better  Peyton Bottoms MD  Assistant Professor Orthopaedic Surgery  [place], [place] [address]

## 2017-07-05 ENCOUNTER — Other Ambulatory Visit (HOSPITAL_BASED_OUTPATIENT_CLINIC_OR_DEPARTMENT_OTHER): Payer: Self-pay | Admitting: Family Medicine

## 2017-07-08 ENCOUNTER — Ambulatory Visit (HOSPITAL_BASED_OUTPATIENT_CLINIC_OR_DEPARTMENT_OTHER): Payer: Self-pay | Admitting: Orthopaedic Surgery

## 2017-07-08 NOTE — Telephone Encounter (Signed)
-----   Message from Salvatore Decent sent at 07/08/2017 10:34 AM EDT -----  Caryl Comes pt     Pt called to request a refill of: tramadol     Preferred Pharmacy     [id number] [place]   [address] [place] [address]   Phone: [phone]: [phone]   Open 24 hours           Thanks.

## 2017-07-08 NOTE — Telephone Encounter (Signed)
Patient had 50 tramadol filled 5/7.  Instructed him that those are as needed.  He was started on Voltaren on 5/20 by his PCP.  He states he has been using the Tramadol for back pain.

## 2017-07-28 ENCOUNTER — Ambulatory Visit (INDEPENDENT_AMBULATORY_CARE_PROVIDER_SITE_OTHER): Payer: Medicare Other | Admitting: Neurology

## 2017-07-28 ENCOUNTER — Other Ambulatory Visit: Payer: Self-pay

## 2017-07-28 ENCOUNTER — Encounter: Payer: Self-pay | Admitting: Neurology

## 2017-07-28 VITALS — BP 150/88 | HR 76 | Ht 72.0 in | Wt 167.0 lb

## 2017-07-28 DIAGNOSIS — R569 Unspecified convulsions: Secondary | ICD-10-CM | POA: Diagnosis not present

## 2017-07-28 DIAGNOSIS — F4310 Post-traumatic stress disorder, unspecified: Secondary | ICD-10-CM | POA: Diagnosis not present
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
## 2017-07-30 ENCOUNTER — Other Ambulatory Visit: Payer: Self-pay

## 2017-07-30 ENCOUNTER — Emergency Department (HOSPITAL_COMMUNITY)
Admission: EM | Admit: 2017-07-30 | Discharge: 2017-07-30 | Disposition: A | Discharge status: Home / Self Care | Payer: Medicare Other | Attending: Emergency Medicine | Admitting: Emergency Medicine

## 2017-07-30 ENCOUNTER — Encounter (HOSPITAL_COMMUNITY): Payer: Self-pay | Admitting: Emergency Medicine

## 2017-07-30 DIAGNOSIS — R569 Unspecified convulsions: Secondary | ICD-10-CM | POA: Insufficient documentation

## 2017-07-30 DIAGNOSIS — M25531 Pain in right wrist: Secondary | ICD-10-CM | POA: Diagnosis not present

## 2017-07-30 DIAGNOSIS — Z5321 Procedure and treatment not carried out due to patient leaving prior to being seen by health care provider: Secondary | ICD-10-CM | POA: Diagnosis not present
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
## 2017-08-16 ENCOUNTER — Encounter (HOSPITAL_COMMUNITY): Payer: Self-pay | Admitting: Emergency Medicine

## 2017-08-16 ENCOUNTER — Emergency Department (HOSPITAL_COMMUNITY)
Admission: EM | Admit: 2017-08-16 | Discharge: 2017-08-16 | Disposition: A | Discharge status: Home / Self Care | Payer: Medicare Other | Attending: Emergency Medicine | Admitting: Emergency Medicine

## 2017-08-16 ENCOUNTER — Other Ambulatory Visit: Payer: Self-pay

## 2017-08-16 ENCOUNTER — Emergency Department (HOSPITAL_COMMUNITY): Payer: Medicare Other

## 2017-08-16 DIAGNOSIS — Y939 Activity, unspecified: Secondary | ICD-10-CM | POA: Diagnosis not present

## 2017-08-16 DIAGNOSIS — Y999 Unspecified external cause status: Secondary | ICD-10-CM | POA: Diagnosis not present

## 2017-08-16 DIAGNOSIS — W19XXXA Unspecified fall, initial encounter: Secondary | ICD-10-CM | POA: Insufficient documentation

## 2017-08-16 DIAGNOSIS — M25511 Pain in right shoulder: Secondary | ICD-10-CM | POA: Diagnosis not present

## 2017-08-16 DIAGNOSIS — F17210 Nicotine dependence, cigarettes, uncomplicated: Secondary | ICD-10-CM | POA: Insufficient documentation

## 2017-08-16 DIAGNOSIS — S022XXA Fracture of nasal bones, initial encounter for closed fracture: Secondary | ICD-10-CM | POA: Insufficient documentation

## 2017-08-16 DIAGNOSIS — Y929 Unspecified place or not applicable: Secondary | ICD-10-CM | POA: Diagnosis not present

## 2017-08-16 DIAGNOSIS — S01511A Laceration without foreign body of lip, initial encounter: Secondary | ICD-10-CM | POA: Insufficient documentation

## 2017-08-16 DIAGNOSIS — S299XXA Unspecified injury of thorax, initial encounter: Secondary | ICD-10-CM | POA: Diagnosis not present

## 2017-08-16 DIAGNOSIS — F329 Major depressive disorder, single episode, unspecified: Secondary | ICD-10-CM | POA: Diagnosis not present

## 2017-08-16 DIAGNOSIS — S022XXB Fracture of nasal bones, initial encounter for open fracture: Secondary | ICD-10-CM | POA: Diagnosis not present

## 2017-08-16 DIAGNOSIS — Z79899 Other long term (current) drug therapy: Secondary | ICD-10-CM | POA: Diagnosis not present

## 2017-08-16 DIAGNOSIS — S4992XA Unspecified injury of left shoulder and upper arm, initial encounter: Secondary | ICD-10-CM | POA: Diagnosis not present

## 2017-08-16 DIAGNOSIS — S020XXB Fracture of vault of skull, initial encounter for open fracture: Secondary | ICD-10-CM | POA: Diagnosis not present

## 2017-08-16 DIAGNOSIS — M542 Cervicalgia: Secondary | ICD-10-CM | POA: Diagnosis not present

## 2017-08-16 LAB — COMPREHENSIVE METABOLIC PANEL
ALBUMIN: 4 g/dL (ref 3.5–5.0)
ALK PHOS: 31 U/L — AB (ref 38–126)
ALT: 27 U/L (ref 0–44)
AST: 48 U/L — ABNORMAL HIGH (ref 15–41)
Anion gap: 12 (ref 5–15)
BUN: 25 mg/dL — ABNORMAL HIGH (ref 8–23)
CALCIUM: 9.1 mg/dL (ref 8.9–10.3)
CO2: 25 mmol/L (ref 22–32)
CREATININE: 0.9 mg/dL (ref 0.61–1.24)
Chloride: 105 mmol/L (ref 98–111)
GFR calc Af Amer: >60 mL/min (ref >60)
GFR calc non Af Amer: >60 mL/min (ref >60)
GLUCOSE: 104 mg/dL — AB (ref 70–99)
Potassium: 3.7 mmol/L (ref 3.5–5.1)
SODIUM: 142 mmol/L (ref 135–145)
Total Bilirubin: 1.1 mg/dL (ref 0.3–1.2)
Total Protein: 7.2 g/dL (ref 6.5–8.1)

## 2017-08-16 LAB — PROTIME-INR
INR: 1.12
Prothrombin Time: 14.4 seconds (ref 11.4–15.2)

## 2017-08-16 LAB — CBC
HEMATOCRIT: 38.8 % — AB (ref 39.0–52.0)
Hemoglobin: 12.5 g/dL — ABNORMAL LOW (ref 13.0–17.0)
MCH: 30 pg (ref 26.0–34.0)
MCHC: 32.2 g/dL (ref 30.0–36.0)
MCV: 93 fL (ref 78.0–100.0)
Platelets: 239 10*3/uL (ref 150–400)
RBC: 4.17 MIL/uL — AB (ref 4.22–5.81)
RDW: 13.2 % (ref 11.5–15.5)
WBC: 7.6 10*3/uL (ref 4.0–10.5)

## 2017-08-16 LAB — ETHANOL: Alcohol, Ethyl (B): <10 mg/dL (ref <10)

## 2017-08-16 MED ORDER — LORAZEPAM 2 MG/ML IJ SOLN
1.0000 mg | Freq: Once | INTRAMUSCULAR | Status: DC
  Administered 2017-08-16: 1 mg via INTRAVENOUS
  Filled 2017-08-16: qty 1

## 2017-08-16 MED ORDER — LIDOCAINE HCL 2 % IJ SOLN
20.0000 mL | Freq: Once | INTRAMUSCULAR | Status: AC
  Administered 2017-08-16: 400 mg
  Filled 2017-08-16: qty 20

## 2017-08-16 MED ORDER — ZIPRASIDONE MESYLATE 20 MG IM SOLR: 20.0000 mg | Freq: Once | INTRAMUSCULAR | Status: DC

## 2017-08-16 MED ORDER — LIDOCAINE-EPINEPHRINE 1 %-1:100000 IJ SOLN
20.0000 mL | Freq: Once | INTRAMUSCULAR | Status: DC
  Filled 2017-08-16: qty 20

## 2017-08-16 MED ORDER — ZIPRASIDONE MESYLATE 20 MG IM SOLR: 20.0000 mg | Freq: Once | INTRAMUSCULAR | Status: DC | PRN

## 2017-08-16 NOTE — ED Notes (Signed)
Pt returned from imaging.

## 2017-08-16 NOTE — ED Notes (Signed)
TTS at bedside. 
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
## 2017-08-16 NOTE — ED Notes (Signed)
Discharge instructions discussed with Pt. Pt verbalized understanding. Pt stable and ambulatory and leaving in police custody.

## 2017-08-16 NOTE — ED Notes (Signed)
Notified pharmacy for [place]

## 2017-08-16 NOTE — Progress Notes (Addendum)
Patient is from jail where they have suicide and homicide watch.  He can return and they can monitor him in jail, patient currently in police custody with handcuffs.  Martin Singleton, PMHNP

## 2017-08-16 NOTE — ED Notes (Signed)
Patient refusing to complete neuro assessment

## 2017-08-16 NOTE — Discharge Instructions (Signed)
You can call and schedule follow-up appointment with ear, nose, and throat.  They typically want you to wait at least 5 to 7 days after an injury to see you in the office so the swelling can go down.  Try to avoid blowing your nose.  Apply ice for 15 to 20 minutes up to 3-4 times a day to help with pain and swelling.  If your nose starts to bleed, leaned forward and pinch your nose for approximately 10 minutes.  Most bleeding will stop with this technique.  Return to the emergency department if you develop a grapelike mass the inside of the septum of the nose, if you have difficulty moving your eyes, or if you develop persistent vomiting, or other new, concerning symptoms.

## 2017-08-16 NOTE — ED Notes (Signed)
Patient cursing at staff and calling staff members names. Law enforcement at bedside

## 2017-08-16 NOTE — ED Triage Notes (Signed)
Nose is dripping blood

## 2017-08-16 NOTE — ED Provider Notes (Signed)
[name] EMERGENCY DEPARTMENT Provider Note   CSN: [id number] Arrival date & time: 08/16/17  0844     History   Chief Complaint Chief Complaint  Patient presents with  . Facial Pain    HPI Martin Singleton is a 62 y.o. male with a history of seizures, chronic neck and low back pain, opioid use disorder, HLD, depression, and cluster headaches who presents to the emergency department accompanied by police with a chief complaint of fall.  Patient states " I was thrown down last night by the sheriff's department.  They broke my damn nose.  They came to my house, destroyed my truck, and took me to jail."  He endorses a headache, allover pain, specifically pain to the bridge of the nose.  He is agitated and generally uncooperative.  Police report that the patient fell from standing and hit his head and shoulder on the corner of a bench before falling to the ground and hitting his head a second time.    He states " I will hit you in the damn face if you touch my nose." He is covered in dried blood.  Please report that the patient's nose had stopped bleeding spontaneously until the patient attempted to blow his nose several times.  He denies loss of vision, syncope, nausea, emesis, or visual changes.  He endorses drinking 2 beers yesterday, but is unable to say what size.  He endorses marijuana use and smokes cigars.  He denies other recreational or illicit drug use.  He does not take any blood thinners.  Level V caveat secondary to acuity of condition and patient not cooperating.   The history is provided by the patient.  No language interpreter was used.    Past Medical History:  Diagnosis Date  . Cluster headaches    entered in Seroquel study at [facility] 9-09  . Colon polyps    5 tubular adenomas on 11-06-11  . Degenerative disc disease, thoracic   . Low back pain    from Spinal stenosis  . Neck pain    from cervical spondylosis  . Other social stressor    with  depression  . Pure hypercholesterolemia   . Seizures [facility])     Patient Active Problem List   Diagnosis Date Noted  . 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 CERVICAL DISCECTOMY    . COLONOSCOPY    . KNEE ARTHROSCOPY          Home Medications    Prior to Admission medications   Medication Sig Start Date End Date Taking? Authorizing Provider  cholecalciferol (VITAMIN D) 400 UNITS TABS tablet Take 400 Units by mouth. Taking 3 capsules   Yes [provider]  clonazePAM (KLONOPIN) 1 MG tablet Take 2 mg by mouth as needed.    Yes [provider]  MORPHINE SULFATE PO Take 45 mg by mouth.    Yes [provider]  omeprazole (PRILOSEC) 20 MG capsule Take 40 mg by mouth daily.   Yes [provider]  promethazine (PHENERGAN) 25 MG tablet Take 25 mg by mouth every 8 (eight) hours as needed for nausea.  04/05/08  Yes [provider]  simvastatin (ZOCOR) 40 MG tablet Take 1 tablet by mouth daily.    Yes [provider]    Family History No family history on file.  Social History Social History   Tobacco Use  .  Smoking status: Current Some Day Smoker    Types: Cigars  . Smokeless tobacco: Never Used  Substance Use Topics  . Alcohol use: Yes    Comment: weekly  . Drug use: Yes    Types: Marijuana     Allergies   Codeine; Hydrocodone bitartrate er; Lyrica [pregabalin]; Methadone hcl; Nucynta [tapentadol]; Sansert [methysergide]; and Xanax [alprazolam]   Review of Systems Review of Systems  Unable to perform ROS: Acuity of condition  Constitutional: Negative for fever.  HENT: Positive for facial swelling and nosebleeds.   Eyes: Negative for redness and visual disturbance.  Respiratory: Negative for shortness of breath.   Gastrointestinal: Negative for diarrhea, nausea and vomiting.  Musculoskeletal: Positive for arthralgias, back  pain, myalgias and neck pain.  Skin: Positive for wound.  Neurological: Negative for syncope.  Psychiatric/Behavioral: Positive for agitation.     Physical Exam Updated Vital Signs BP (!) 148/92   Pulse 82   Resp 12   SpO2 98%   Physical Exam  Constitutional: He appears well-developed.  Covered in dried blood.  Handcuffs in place to the bilateral upper and lower extremities.  HENT:  Head: Normocephalic.  Laceration and swelling noted to the bridge of the nose.  Laceration noted to the mid-superior labia.  Eyes: Pupils are equal, round, and reactive to light. Conjunctivae and EOM are normal.  Neck: Normal range of motion. Neck supple. No tracheal deviation present.  Full active and passive range of motion of the neck.  Cardiovascular: Normal rate, regular rhythm, normal heart sounds and intact distal pulses. Exam reveals no gallop and no friction rub.  No murmur heard. Pulmonary/Chest: Effort normal. No stridor. No respiratory distress. He has wheezes. He has no rales. He exhibits tenderness.  Diffuse bilateral expiratory wheezes.  No increased work of breathing.  Diffusely tender to the bilateral ribs and left clavicle.  No obvious deformity.  Abdominal: Soft. He exhibits no distension. There is no tenderness.  No ecchymosis, abrasions, or lacerations noted to the abdominal wall.  Musculoskeletal: He exhibits tenderness.  Exam is extremely limited as the patient is uncooperative.  Neurological: He is alert.  Cranial nerves II through XII are grossly intact.  Moves all 4 extremities.  Skin: Skin is warm and dry. He is not diaphoretic.  Psychiatric: He is agitated.  Nursing note and vitals reviewed.  ED Treatments / Results  Labs (all labs ordered are listed, but only abnormal results are displayed) Labs Reviewed  CBC - Abnormal; Notable for the following components:      Result Value   RBC 4.17 (*)    Hemoglobin 12.5 (*)    HCT 38.8 (*)    All other components within  normal limits  COMPREHENSIVE METABOLIC PANEL - Abnormal; Notable for the following components:   Glucose, Bld 104 (*)    BUN 25 (*)    AST 48 (*)    Alkaline Phosphatase 31 (*)    All other components within normal limits  ETHANOL  PROTIME-INR  RAPID URINE DRUG SCREEN, HOSP PERFORMED  URINALYSIS, ROUTINE W REFLEX MICROSCOPIC    EKG None  Radiology Dg Chest 2 View  Result Date: 08/16/2017 CLINICAL DATA:  Fall EXAM: CHEST - 2 VIEW COMPARISON:  08/12/2009 FINDINGS: Heart is borderline in size. Lungs clear. No effusions or edema. No acute bony abnormality. IMPRESSION: Borderline heart size.  No active disease. Electronically Signed   By: Charlett Nose M.D.   On: 08/16/2017 12:59   Dg Shoulder Right  Result Date: 08/16/2017 CLINICAL  DATA:  RIGHT shoulder pain, injury EXAM: RIGHT SHOULDER - 2+ VIEW COMPARISON:  08/08/2009 FINDINGS: Osseous demineralization. AC joint alignment normal with minimal degenerative changes. No acute fracture, dislocation, or bone destruction. Visualized RIGHT ribs intact. Prior cervical spine fusion. IMPRESSION: No acute osseous abnormalities. Electronically Signed   By: Ulyses Southward M.D.   On: 08/16/2017 12:08   Ct Head Wo Contrast  Result Date: 08/16/2017 CLINICAL DATA:  Headaches, neck pain. EXAM: CT HEAD WITHOUT CONTRAST CT MAXILLOFACIAL WITHOUT CONTRAST CT CERVICAL SPINE WITHOUT CONTRAST TECHNIQUE: Multidetector CT imaging of the head, cervical spine, and maxillofacial structures were performed using the standard protocol without intravenous contrast. Multiplanar CT image reconstructions of the cervical spine and maxillofacial structures were also generated. COMPARISON:  CT scan of April 21, 2017. FINDINGS: CT HEAD FINDINGS Brain: No evidence of acute infarction, hemorrhage, hydrocephalus, extra-axial collection or mass lesion/mass effect. Vascular: No hyperdense vessel or unexpected calcification. Skull: Normal. Negative for fracture or focal lesion. Other: None. CT  MAXILLOFACIAL FINDINGS Osseous: Moderately displaced and comminuted right nasal bone fracture is noted. Orbits: Negative. No traumatic or inflammatory finding. Sinuses: Clear. Soft tissues: Negative. CT CERVICAL SPINE FINDINGS Alignment: Normal. Skull base and vertebrae: No acute fracture. No primary bone lesion or focal pathologic process. Soft tissues and spinal canal: No prevertebral fluid or swelling. No visible canal hematoma. Disc levels: Status post surgical anterior fusion of C4-5. 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 C5-6 fusion is also noted which may be due to degenerative change or congenital. Upper chest: Negative. Other: None. IMPRESSION: Normal head CT. Moderately displaced and comminuted right nasal bone fracture is noted. Postsurgical and degenerative changes are noted in the cervical spine. No acute abnormality is noted. Electronically Signed   By: Lupita Raider, M.D.   On: 08/16/2017 11:58   Ct Cervical Spine Wo Contrast  Result Date: 08/16/2017 CLINICAL DATA:  Headaches, neck pain. EXAM: CT HEAD WITHOUT CONTRAST CT MAXILLOFACIAL WITHOUT CONTRAST CT CERVICAL SPINE WITHOUT CONTRAST TECHNIQUE: Multidetector CT imaging of the head, cervical spine, and maxillofacial structures were performed using the standard protocol without intravenous contrast. Multiplanar CT image reconstructions of the cervical spine and maxillofacial structures were also generated. COMPARISON:  CT scan of April 21, 2017. FINDINGS: CT HEAD FINDINGS Brain: No evidence of acute infarction, hemorrhage, hydrocephalus, extra-axial collection or mass lesion/mass effect. Vascular: No hyperdense vessel or unexpected calcification. Skull: Normal. Negative for fracture or focal lesion. Other: None. CT MAXILLOFACIAL FINDINGS Osseous: Moderately displaced and comminuted right nasal bone fracture is noted. Orbits: Negative. No traumatic or inflammatory finding. Sinuses: Clear. Soft tissues: Negative. CT CERVICAL  SPINE FINDINGS Alignment: Normal. Skull base and vertebrae: No acute fracture. No primary bone lesion or focal pathologic process. Soft tissues and spinal canal: No prevertebral fluid or swelling. No visible canal hematoma. Disc levels: Status post surgical anterior fusion of C4-5. 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 C5-6 fusion is also noted which may be due to degenerative change or congenital. Upper chest: Negative. Other: None. IMPRESSION: Normal head CT. Moderately displaced and comminuted right nasal bone fracture is noted. Postsurgical and degenerative changes are noted in the cervical spine. No acute abnormality is noted. Electronically Signed   By: Lupita Raider, M.D.   On: 08/16/2017 11:58   Dg Shoulder Left  Result Date: 08/16/2017 CLINICAL DATA:  Status post fall. EXAM: LEFT SHOULDER - 2+ VIEW COMPARISON:  None. FINDINGS: There is no fracture or dislocation.  Left glenohumeral joint is normal. There are mild degenerative changes of the acromioclavicular joint. IMPRESSION: No acute osseous injury of the left shoulder. Electronically Signed   By: Elige Ko   On: 08/16/2017 12:10   Ct Maxillofacial Wo Contrast  Result Date: 08/16/2017 CLINICAL DATA:  Headaches, neck pain. EXAM: CT HEAD WITHOUT CONTRAST CT MAXILLOFACIAL WITHOUT CONTRAST CT CERVICAL SPINE WITHOUT CONTRAST TECHNIQUE: Multidetector CT imaging of the head, cervical spine, and maxillofacial structures were performed using the standard protocol without intravenous contrast. Multiplanar CT image reconstructions of the cervical spine and maxillofacial structures were also generated. COMPARISON:  CT scan of April 21, 2017. FINDINGS: CT HEAD FINDINGS Brain: No evidence of acute infarction, hemorrhage, hydrocephalus, extra-axial collection or mass lesion/mass effect. Vascular: No hyperdense vessel or unexpected calcification. Skull: Normal. Negative for fracture or focal lesion. Other: None. CT MAXILLOFACIAL FINDINGS  Osseous: Moderately displaced and comminuted right nasal bone fracture is noted. Orbits: Negative. No traumatic or inflammatory finding. Sinuses: Clear. Soft tissues: Negative. CT CERVICAL SPINE FINDINGS Alignment: Normal. Skull base and vertebrae: No acute fracture. No primary bone lesion or focal pathologic process. Soft tissues and spinal canal: No prevertebral fluid or swelling. No visible canal hematoma. Disc levels: Status post surgical anterior fusion of C4-5. 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 C5-6 fusion is also noted which may be due to degenerative change or congenital. Upper chest: Negative. Other: None. IMPRESSION: Normal head CT. Moderately displaced and comminuted right nasal bone fracture is noted. Postsurgical and degenerative changes are noted in the cervical spine. No acute abnormality is noted. Electronically Signed   By: Lupita Raider, M.D.   On: 08/16/2017 11:58    Procedures .Marland KitchenLaceration Repair  Date/Time: 08/16/2017 3:52 PM  Performed by: Barkley Boards, PA-C  Authorized by: Barkley Boards, PA-C   Consent:    Consent obtained:  Verbal   Consent given by:  Patient   Risks discussed:  Infection, pain, poor cosmetic result and need for additional repair   Alternatives discussed:  No treatment Anesthesia (see MAR for exact dosages):    Anesthesia method:  Local infiltration   Local anesthetic:  Lidocaine 2% w/o epi Laceration details:    Location:  Lip   Length (cm):  0.5 Repair type:    Repair type:  Simple Pre-procedure details:    Preparation:  Patient was prepped and draped in usual sterile fashion and imaging obtained to evaluate for foreign bodies Exploration:    Wound exploration: wound explored through full range of motion and entire depth of wound probed and visualized     Wound extent: no fascia violation noted, no foreign bodies/material noted, no muscle damage noted, no nerve damage noted, no tendon damage noted, no underlying  fracture noted and no vascular damage noted     Contaminated: no   Treatment:    Area cleansed with:  Saline   Amount of cleaning:  Standard Skin repair:    Repair method:  Sutures   Suture size:  5-0   Suture material:  Chromic gut   Number of sutures:  1 Approximation:    Approximation:  Close Post-procedure details:    Dressing:  Open (no dressing)   Patient tolerance of procedure:  Tolerated well, no immediate complications .Marland KitchenLaceration Repair  Date/Time: 08/16/2017 3:54 PM  Performed by: Barkley Boards, PA-C  Authorized by: Barkley Boards, PA-C   Consent:    Consent obtained:  Verbal   Consent given by:  Patient   Risks discussed:  Infection, pain, poor cosmetic result and need for additional repair   Alternatives discussed:  No treatment Anesthesia (see MAR for exact dosages):    Anesthesia method:  Local infiltration   Local anesthetic:  Lidocaine 2% w/o epi Laceration details:    Location: nose.   Length (cm):  0.5 Repair type:    Repair type:  Simple Pre-procedure details:    Preparation:  Patient was prepped and draped in usual sterile fashion and imaging obtained to evaluate for foreign bodies Exploration:    Hemostasis achieved with:  Direct pressure   Wound exploration: wound explored through full range of motion and entire depth of wound probed and visualized     Wound extent: no fascia violation noted, no foreign bodies/material noted, no muscle damage noted, no nerve damage noted, no tendon damage noted, no underlying fracture noted and no vascular damage noted     Contaminated: no   Treatment:    Area cleansed with:  Saline Skin repair:    Repair method:  Sutures   Suture size:  5-0   Wound skin closure material used: Vicryl Rapide.   Suture technique:  Simple interrupted   Number of sutures:  2 Approximation:    Approximation:  Close Post-procedure details:    Dressing:  Open (no dressing)   Patient tolerance of procedure:  Tolerated well, no  immediate complications   (including critical care time)  Medications Ordered in ED Medications  lidocaine (XYLOCAINE) 2 % (with pres) injection 400 mg (400 mg Infiltration Given by Other 08/16/17 1407)     Initial Impression / Assessment and Plan / ED Course  I have reviewed the triage vital signs and the nursing notes.  Pertinent labs & imaging results that were available during my care of the patient were reviewed by me and considered in my medical decision making (see chart for details).  Clinical Course as of Aug 16 1553  Mon Aug 16, 2017  1105 Recheck.  Patient was given Ativan approximately 20 minutes ago.  On reexamination, he is much more cooperative.   [MM]    Clinical Course User Index [MM] Evelisse Szalkowski A, PA-C   62 year old male with a history of seizures, chronic neck and low back pain, opioid use disorder, HLD, depression, and cluster headaches who presents to the emergency department accompanied by police with a chief complaint of fall.  The patient is extremely agitated and history is limited as he has rather uncooperative.  Police report that the patient fell from standing and hit his face and shoulder on a bench before falling to the floor.  They are unsure which shoulder, and the patient is tender bilaterally on exam, but he is equally tender to everywhere that has been palpated.  Ativan given for agitation.  Patient is now endorsing suicidal ideation.  He denies SI or auditory visual hallucinations.  Consult to TTS who recommended that since the patient is going to jail that he will be on homicidal and suicidal watch in the inpatient treatment is not indicated at this time.  CT max of facial with moderately displaced and comminuted right nasal bone fracture.  Imaging is otherwise unremarkable.  No septal hematoma.  The patient also has a small laceration to the superior labia.  Laceration repair completed.  Tetanus is currently up-to-date.  He has no other complaints at  this time and is medically cleared to return to jail. He has been given a referral to ENT for follow-up.  Strict return precautions given.  He is hemodynamically stable and in no acute distress.  He is safe for discharge at this time.  Final Clinical Impressions(s) / ED Diagnoses   Final diagnoses:  Fall, initial encounter  Open fracture of nasal bone, initial encounter    ED Discharge Orders    None      Barkley BoardsMcDonald, Reneshia Zuccaro A, PA-C 08/16/17 1557  Blane OharaZavitz, Joshua, MD 08/17/17 1046  Blane OharaZavitz, Joshua, MD 08/17/17 1148

## 2017-08-16 NOTE — ED Notes (Signed)
Suture cart at the bedside.  

## 2017-08-17 ENCOUNTER — Encounter (HOSPITAL_COMMUNITY): Payer: Self-pay | Admitting: Radiation Oncology

## 2017-08-18 ENCOUNTER — Emergency Department (HOSPITAL_COMMUNITY)
Admission: EM | Admit: 2017-08-18 | Discharge: 2017-08-19 | Disposition: A | Discharge status: Home / Self Care | Payer: Medicare Other | Attending: Emergency Medicine | Admitting: Emergency Medicine

## 2017-08-18 ENCOUNTER — Emergency Department (HOSPITAL_COMMUNITY): Payer: Medicare Other

## 2017-08-18 ENCOUNTER — Other Ambulatory Visit: Payer: Self-pay

## 2017-08-18 DIAGNOSIS — Y9389 Activity, other specified: Secondary | ICD-10-CM | POA: Diagnosis not present

## 2017-08-18 DIAGNOSIS — Z79899 Other long term (current) drug therapy: Secondary | ICD-10-CM | POA: Insufficient documentation

## 2017-08-18 DIAGNOSIS — R45850 Homicidal ideations: Secondary | ICD-10-CM | POA: Diagnosis not present

## 2017-08-18 DIAGNOSIS — Y998 Other external cause status: Secondary | ICD-10-CM | POA: Diagnosis not present

## 2017-08-18 DIAGNOSIS — S0990XA Unspecified injury of head, initial encounter: Secondary | ICD-10-CM | POA: Diagnosis not present

## 2017-08-18 DIAGNOSIS — F4325 Adjustment disorder with mixed disturbance of emotions and conduct: Secondary | ICD-10-CM | POA: Diagnosis not present

## 2017-08-18 DIAGNOSIS — F17290 Nicotine dependence, other tobacco product, uncomplicated: Secondary | ICD-10-CM | POA: Diagnosis not present

## 2017-08-18 DIAGNOSIS — S0993XA Unspecified injury of face, initial encounter: Secondary | ICD-10-CM | POA: Diagnosis not present

## 2017-08-18 DIAGNOSIS — Y33XXXA Other specified events, undetermined intent, initial encounter: Secondary | ICD-10-CM | POA: Insufficient documentation

## 2017-08-18 DIAGNOSIS — Y929 Unspecified place or not applicable: Secondary | ICD-10-CM | POA: Diagnosis not present

## 2017-08-18 DIAGNOSIS — S199XXA Unspecified injury of neck, initial encounter: Secondary | ICD-10-CM | POA: Diagnosis not present

## 2017-08-18 DIAGNOSIS — G40909 Epilepsy, unspecified, not intractable, without status epilepticus: Secondary | ICD-10-CM | POA: Diagnosis not present

## 2017-08-18 DIAGNOSIS — F329 Major depressive disorder, single episode, unspecified: Secondary | ICD-10-CM | POA: Diagnosis present

## 2017-08-18 DIAGNOSIS — E7800 Pure hypercholesterolemia, unspecified: Secondary | ICD-10-CM | POA: Insufficient documentation

## 2017-08-18 LAB — RAPID URINE DRUG SCREEN, HOSP PERFORMED
AMPHETAMINES: NOT DETECTED
Benzodiazepines: NOT DETECTED
Cocaine: POSITIVE — AB
OPIATES: NOT DETECTED
Tetrahydrocannabinol: POSITIVE — AB

## 2017-08-18 LAB — CBC WITH DIFFERENTIAL/PLATELET
Basophils Absolute: 0 10*3/uL (ref 0.0–0.1)
Basophils Relative: 0 %
EOS ABS: 0.1 10*3/uL (ref 0.0–0.7)
EOS PCT: 2 %
HCT: 37.6 % — ABNORMAL LOW (ref 39.0–52.0)
Hemoglobin: 12.4 g/dL — ABNORMAL LOW (ref 13.0–17.0)
LYMPHS ABS: 1.3 10*3/uL (ref 0.7–4.0)
Lymphocytes Relative: 21 %
MCH: 30.3 pg (ref 26.0–34.0)
MCHC: 33 g/dL (ref 30.0–36.0)
MCV: 91.9 fL (ref 78.0–100.0)
MONO ABS: 0.6 10*3/uL (ref 0.1–1.0)
MONOS PCT: 9 %
Neutro Abs: 4.4 10*3/uL (ref 1.7–7.7)
Neutrophils Relative %: 68 %
PLATELETS: 264 10*3/uL (ref 150–400)
RBC: 4.09 MIL/uL — AB (ref 4.22–5.81)
RDW: 13.4 % (ref 11.5–15.5)
WBC: 6.4 10*3/uL (ref 4.0–10.5)

## 2017-08-18 LAB — BASIC METABOLIC PANEL
Anion gap: 7 (ref 5–15)
BUN: 21 mg/dL (ref 8–23)
CHLORIDE: 108 mmol/L (ref 98–111)
CO2: 28 mmol/L (ref 22–32)
CREATININE: 0.66 mg/dL (ref 0.61–1.24)
Calcium: 9 mg/dL (ref 8.9–10.3)
GFR calc Af Amer: >60 mL/min (ref >60)
GFR calc non Af Amer: >60 mL/min (ref >60)
GLUCOSE: 156 mg/dL — AB (ref 70–99)
POTASSIUM: 3.4 mmol/L — AB (ref 3.5–5.1)
SODIUM: 143 mmol/L (ref 135–145)

## 2017-08-18 LAB — ACETAMINOPHEN LEVEL

## 2017-08-18 LAB — ETHANOL

## 2017-08-18 LAB — SALICYLATE LEVEL
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
## 2017-08-18 NOTE — ED Notes (Signed)
Bed: [facility] Expected date:  Expected time:  Means of arrival:  Comments: 

## 2017-08-18 NOTE — BHH Counselor (Signed)
Clinician contacted Marchelle FolksAmanda, RN to set up tele-assessment cart. Clinician asked RN to fax pt's IVC paperwork [phone]). Clinician to called tge cart in five minutes.   Martin Pullingreylese D Lynae Pederson, MS, [facility], [facility] Triage Specialist [phone]
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
## 2017-08-19 ENCOUNTER — Encounter (HOSPITAL_COMMUNITY): Payer: Self-pay | Admitting: *Deleted

## 2017-08-19 ENCOUNTER — Other Ambulatory Visit: Payer: Self-pay

## 2017-08-19 ENCOUNTER — Emergency Department (HOSPITAL_COMMUNITY)
Admission: EM | Admit: 2017-08-19 | Discharge: 2017-08-19 | Disposition: A | Discharge status: Home / Self Care | Payer: Medicare Other | Source: Home / Self Care | Attending: Emergency Medicine | Admitting: Emergency Medicine

## 2017-08-19 DIAGNOSIS — S0990XD Unspecified injury of head, subsequent encounter: Secondary | ICD-10-CM

## 2017-08-19 DIAGNOSIS — F17290 Nicotine dependence, other tobacco product, uncomplicated: Secondary | ICD-10-CM | POA: Insufficient documentation

## 2017-08-19 DIAGNOSIS — F4325 Adjustment disorder with mixed disturbance of emotions and conduct: Secondary | ICD-10-CM | POA: Diagnosis not present

## 2017-08-19 DIAGNOSIS — R45850 Homicidal ideations: Secondary | ICD-10-CM | POA: Insufficient documentation

## 2017-08-19 DIAGNOSIS — Z79899 Other long term (current) drug therapy: Secondary | ICD-10-CM

## 2017-08-19 DIAGNOSIS — R569 Unspecified convulsions: Secondary | ICD-10-CM | POA: Insufficient documentation

## 2017-08-19 DIAGNOSIS — S022XXD Fracture of nasal bones, subsequent encounter for fracture with routine healing: Secondary | ICD-10-CM

## 2017-08-19 DIAGNOSIS — G40909 Epilepsy, unspecified, not intractable, without status epilepticus: Secondary | ICD-10-CM | POA: Diagnosis not present

## 2017-08-19 DIAGNOSIS — R58 Hemorrhage, not elsewhere classified: Secondary | ICD-10-CM | POA: Diagnosis not present

## 2017-08-19 LAB — CBC WITH DIFFERENTIAL/PLATELET
BASOS PCT: 0 %
Basophils Absolute: 0 10*3/uL (ref 0.0–0.1)
EOS ABS: 0.1 10*3/uL (ref 0.0–0.7)
EOS PCT: 1 %
HCT: 37.9 % — ABNORMAL LOW (ref 39.0–52.0)
HEMOGLOBIN: 12.5 g/dL — AB (ref 13.0–17.0)
LYMPHS ABS: 1.1 10*3/uL (ref 0.7–4.0)
Lymphocytes Relative: 16 %
MCH: 30.5 pg (ref 26.0–34.0)
MCHC: 33 g/dL (ref 30.0–36.0)
MCV: 92.4 fL (ref 78.0–100.0)
Monocytes Absolute: 0.5 10*3/uL (ref 0.1–1.0)
Monocytes Relative: 8 %
NEUTROS PCT: 75 %
Neutro Abs: 4.9 10*3/uL (ref 1.7–7.7)
Platelets: 268 10*3/uL (ref 150–400)
RBC: 4.1 MIL/uL — ABNORMAL LOW (ref 4.22–5.81)
RDW: 13.1 % (ref 11.5–15.5)
WBC: 6.5 10*3/uL (ref 4.0–10.5)

## 2017-08-19 LAB — COMPREHENSIVE METABOLIC PANEL
ALK PHOS: 36 U/L — AB (ref 38–126)
ALT: 34 U/L (ref 0–44)
ANION GAP: 7 (ref 5–15)
AST: 46 U/L — ABNORMAL HIGH (ref 15–41)
Albumin: 3.7 g/dL (ref 3.5–5.0)
BUN: 21 mg/dL (ref 8–23)
CALCIUM: 9.1 mg/dL (ref 8.9–10.3)
CO2: 29 mmol/L (ref 22–32)
Chloride: 109 mmol/L (ref 98–111)
Creatinine, Ser: 0.86 mg/dL (ref 0.61–1.24)
GFR calc non Af Amer: >60 mL/min (ref >60)
Glucose, Bld: 131 mg/dL — ABNORMAL HIGH (ref 70–99)
POTASSIUM: 3.7 mmol/L (ref 3.5–5.1)
SODIUM: 145 mmol/L (ref 135–145)
TOTAL PROTEIN: 7.3 g/dL (ref 6.5–8.1)
Total Bilirubin: 0.8 mg/dL (ref 0.3–1.2)

## 2017-08-19 LAB — RAPID URINE DRUG SCREEN, HOSP PERFORMED
AMPHETAMINES: NOT DETECTED
BENZODIAZEPINES: NOT DETECTED
COCAINE: POSITIVE — AB
Opiates: NOT DETECTED
Tetrahydrocannabinol: POSITIVE — AB

## 2017-08-19 MED ORDER — ACETAMINOPHEN 325 MG PO TABS
650.0000 mg | ORAL_TABLET | Freq: Once | ORAL | Status: AC
  Administered 2017-08-19: 650 mg via ORAL
  Filled 2017-08-19: qty 2

## 2017-08-19 MED ORDER — GABAPENTIN 300 MG PO CAPS: 300.0000 mg | ORAL_CAPSULE | Freq: Three times a day (TID) | ORAL | 0 refills | Status: DC

## 2017-08-19 MED ORDER — LORAZEPAM 2 MG/ML IJ SOLN
1.0000 mg | Freq: Once | INTRAMUSCULAR | Status: AC
  Administered 2017-08-19: 1 mg via INTRAVENOUS
  Filled 2017-08-19: qty 1

## 2017-08-19 MED ORDER — GABAPENTIN 300 MG PO CAPS
300.0000 mg | ORAL_CAPSULE | Freq: Three times a day (TID) | ORAL | Status: DC
  Administered 2017-08-19: 300 mg via ORAL
  Filled 2017-08-19: qty 1

## 2017-08-19 MED ORDER — HALOPERIDOL LACTATE 5 MG/ML IJ SOLN
2.0000 mg | Freq: Once | INTRAMUSCULAR | Status: AC
  Administered 2017-08-19: 2 mg via INTRAMUSCULAR
  Filled 2017-08-19: qty 1
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
## 2017-08-19 NOTE — Discharge Instructions (Addendum)
Please follow-up with your neurologist.  Return if worsening symptoms

## 2017-08-19 NOTE — ED Notes (Signed)
Pt discharged home. Discharged instructions read to pt who verbalized understanding. All belongings returned to pt who signed for same. Denies SI/HI, is not delusional and not responding to internal stimuli. Escorted pt to the ED exit.  Pt calm, cooperative, and ready to go home.
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
## 2017-08-19 NOTE — Discharge Instructions (Signed)
For your behavioral health needs, you are advised to follow [facility] with the [facility]:       [facility]      [address]      [place], [place] [address]      [phone]

## 2017-08-19 NOTE — ED Notes (Signed)
Bed: WA20 Expected date:  Expected time:  Means of arrival:  Comments: [age] M seizure, hx of same

## 2017-08-19 NOTE — ED Notes (Addendum)
Following is list of belongings placed in locker 26 for the patient:  Bloody gray pants, bloody flowered button down shirt, brown shoes, brown belt, 1 wallet, 1 watch, and 1 ring.

## 2017-08-19 NOTE — BHH Suicide Risk Assessment (Cosign Needed)
Suicide Risk Assessment  Discharge Assessment   [facility]   Principal Problem: Adjustment disorder with mixed disturbance of emotions and conduct Discharge Diagnoses:  Patient Active Problem List   Diagnosis Date Noted  . Homicidal ideations [R45.850]   . Adjustment disorder with mixed disturbance of emotions and conduct [F43.25] 04/22/2017    Total Time spent with patient: 45 minutes  Musculoskeletal: Strength & Muscle Tone: within normal limits Gait & Station: normal Patient leans: N/A  Psychiatric Specialty Exam: Physical Exam  Constitutional: He is oriented to person, place, and time. He appears well-developed and well-nourished.  HENT:  Head: Normocephalic.  Respiratory: Effort normal.  Musculoskeletal: Normal range of motion.  Neurological: He is alert and oriented to person, place, and time.  Psychiatric: His speech is normal and behavior is normal. Thought content normal. Cognition and memory are normal. He expresses impulsivity. He exhibits a depressed mood.   ROS Blood pressure 137/81, pulse 63, temperature 97.6 F (36.4 C), temperature source Oral, resp. rate 16, height 6' (1.829 m), weight 151 lb 0.2 oz (68.5 kg), SpO2 99 %.Body mass index is 20.48 kg/m. General Appearance: Casual and Guarded Eye Contact:  Good Speech:  Clear and Coherent and Normal Rate Volume:  Normal Mood:  Anxious, Depressed and Irritable Affect:  Congruent and Depressed Thought Process:  Coherent, Goal Directed, Linear and Descriptions of Associations: Intact Orientation:  Full (Time, Place, and Person) Thought Content:  Logical Suicidal Thoughts:  No Homicidal Thoughts:  No Memory:  Immediate;   Good Recent;   Good Remote;   Fair Judgement:  Intact Insight:  Shallow Psychomotor Activity:  Normal Concentration:  Concentration: Good and Attention Span: Good Recall:  Good Fund of Knowledge:  Good Language:  Good Akathisia:  No Handed:  Right AIMS (if  indicated):    Assets:  Administrator, artsCommunication Skills Financial Resources/Insurance Housing Social Support Transportation Vocational/Educational ADL's:  Intact Cognition:  WNL Sleep:   Fair   Mental Status Per Nursing Assessment::   On Admission:   Homicidal ideation  Demographic Factors:  Male, Caucasian, Living alone and Unemployed  Loss Factors: Legal issues and Financial problems/change in socioeconomic status  Historical Factors: Impulsivity  Risk Reduction Factors:   Sense of responsibility to family  Continued Clinical Symptoms:  Depression:   Comorbid alcohol abuse/dependence Impulsivity Alcohol/Substance Abuse/Dependencies  Cognitive Features That Contribute To Risk:  Closed-mindedness    Suicide Risk:  Minimal: No identifiable suicidal ideation.  Patients presenting with no risk factors but with morbid ruminations; may be classified as minimal risk based on the severity of the depressive symptoms    Plan Of Care/Follow-up recommendations:  Activity:  as tolerated Diet:  Heart Healthy  Martin AbbeLaurie Britton Kadija Cruzen, NP 08/19/2017, 12:08 PM
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
## 2017-08-23 ENCOUNTER — Other Ambulatory Visit: Payer: Self-pay | Admitting: *Deleted

## 2017-08-23 NOTE — Patient Outreach (Signed)
[facility]) Care Management  08/23/2017  Martin LadeDavid A Singleton 12/28/1955 [id number]   Telephone Screen  Referral Date:  08/20/2017 Referral Source[facility]  [facility] Reason for Referral:  6 or more ED Visits in the past 6 onths Insurance:  Medicare   Outreach Attempt:  Outreach attempt #1 to patient for telephone screening. No answer. RN Health Coach left HIPAA compliant voicemail message along with contact information.  Plan:  RN Health Coach will send unsuccessful outreach letter to patient.  RN Health Coach will make another outreach attempt to patient within 3-4 business days if no return call back from patient.   Rhae LernerFarrah Tonny Isensee RN [facility] Care [facility]  RN Health Coach [phone] [email or web address]

## 2017-08-25 ENCOUNTER — Other Ambulatory Visit: Payer: Self-pay | Admitting: *Deleted

## 2017-08-25 DIAGNOSIS — S8012XA Contusion of left lower leg, initial encounter: Secondary | ICD-10-CM | POA: Diagnosis not present

## 2017-08-25 NOTE — Patient Outreach (Signed)
[facility]) Care Management  08/25/2017  Martin LadeDavid A Singleton 12/09/1955 [id number]   Telephone Screen  Referral Date:  08/20/2017 [facility]  [facility] Reason for Referral:  6 or more ED Visits in the past 6 months Insurance:  Medicare   Outreach Attempt:  Successful telephone outreach to patient for telephone screening. HIPPA verified with patient.  Patient stating [place]  States his currently does not have a primary care provider because [facility].  States while he is here in town, he sees Dr. Windle GuardWilson Elkins with [facility], whom is not in network [facility]. During conversation, patient does state he has an appointment with Dr. Jeannetta NapElkins today and also plans [facility] with his PTSD and seizures.  RN Health Coach was reviewing [facility] services and network physicians with patient, when patient became very angry and requested to end the conversation.  Call ended.  Plan:  RN Health Coach will notify [facility] Administrative assistant patient does not have [facility] in network primary care provider.  RN Health Coach will close case.  Rhae LernerFarrah Jowana Thumma RN [facility] Care Management  RN Health Coach [phone] [email or web address]

## 2017-08-31 ENCOUNTER — Encounter (HOSPITAL_BASED_OUTPATIENT_CLINIC_OR_DEPARTMENT_OTHER): Payer: Self-pay | Admitting: Orthopaedic Surgery

## 2017-09-07 ENCOUNTER — Encounter (HOSPITAL_BASED_OUTPATIENT_CLINIC_OR_DEPARTMENT_OTHER): Payer: Self-pay | Admitting: Orthopaedic Surgery

## 2017-09-08 ENCOUNTER — Telehealth: Payer: Self-pay | Admitting: Neurology

## 2017-09-08 NOTE — Telephone Encounter (Signed)
We discussed doing an MRI brain and a 1-hour sleep-deprived EEG, is this the sleep study he meant? Pls check on MRI status, I am also cc'ing Darl PikesSusan to schedule EEG. Thanks

## 2017-09-08 NOTE — Telephone Encounter (Signed)
Once he has a date for MRI as well, I will find a spot somewhere to discuss both tests. Thanks

## 2017-09-08 NOTE — Telephone Encounter (Signed)
Cleared up question for patient.  He would like to follow up after all his testing is done to discuss results.  Is there a time that you can open to schedule him?  His EEG is 09/16/2017.  Please advise.

## 2017-09-08 NOTE — Telephone Encounter (Signed)
Patient states that we were sending him for a sleep study and he has not heard from anyone about this. Would like a call to check the status of this referral

## 2017-09-08 NOTE — Telephone Encounter (Signed)
Mri is also scheduled for end of July.

## 2017-09-15 ENCOUNTER — Ambulatory Visit
Admission: RE | Admit: 2017-09-15 | Discharge: 2017-09-15 | Disposition: A | Discharge status: Home / Self Care | Payer: Medicare Other | Source: Ambulatory Visit | Attending: Neurology | Admitting: Neurology

## 2017-09-15 DIAGNOSIS — F4310 Post-traumatic stress disorder, unspecified: Secondary | ICD-10-CM

## 2017-09-15 DIAGNOSIS — R569 Unspecified convulsions: Secondary | ICD-10-CM

## 2017-09-15 DIAGNOSIS — R479 Unspecified speech disturbances: Secondary | ICD-10-CM | POA: Diagnosis not present

## 2017-09-15 MED ORDER — GADOBENATE DIMEGLUMINE 529 MG/ML IV SOLN
15.0000 mL | Freq: Once | INTRAVENOUS | Status: AC | PRN
  Administered 2017-09-15: 15 mL via INTRAVENOUS

## 2017-09-16 ENCOUNTER — Ambulatory Visit (INDEPENDENT_AMBULATORY_CARE_PROVIDER_SITE_OTHER): Payer: Medicare Other | Admitting: Neurology

## 2017-09-16 DIAGNOSIS — R569 Unspecified convulsions: Secondary | ICD-10-CM

## 2017-09-16 DIAGNOSIS — F4310 Post-traumatic stress disorder, unspecified: Secondary | ICD-10-CM

## 2017-09-17 ENCOUNTER — Telehealth: Payer: Self-pay

## 2017-09-17 DIAGNOSIS — R569 Unspecified convulsions: Secondary | ICD-10-CM

## 2017-09-17 DIAGNOSIS — F4310 Post-traumatic stress disorder, unspecified: Secondary | ICD-10-CM

## 2017-09-17 NOTE — Procedures (Signed)
ELECTROENCEPHALOGRAM REPORT  Date of Study: 09/16/2017  Patient's Name: Martin LadeDavid A Singleton MRN: [id number] Date of Birth: May 20, 1955  Referring Provider: Dr. 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 EEG for classification.   Medications: KLONOPIN PO  VITAMIN D 400 UNITS TABS tablet  MORPHINE SULFATE PO  PRILOSEC 20 MG capsule  PHENERGAN) 25 MG tablet  SIMVASTATIN PO   Technical Summary: A multichannel digital 1-hour sleep-deprived EEG recording measured by the international 10-20 system with electrodes applied with paste and impedances below 5000 ohms performed in our laboratory with EKG monitoring in an awake and asleep patient.  Hyperventilation and photic stimulation were performed.  The digital EEG was referentially recorded, reformatted, and digitally filtered in a variety of bipolar and referential montages for optimal display.    Description: The patient is awake and asleep during the recording.  During maximal wakefulness, there is a symmetric, medium voltage 8.5 Hz posterior dominant rhythm that attenuates with eye opening.  The record is symmetric.  During drowsiness and sleep, there is an increase in theta slowing of the background.  Vertex waves and symmetric sleep spindles were seen.  Hyperventilation and photic stimulation did not elicit any abnormalities.  There were no epileptiform discharges or electrographic seizures seen.    EKG lead was unremarkable.  Impression: This 1-hour awake and asleep EEG is normal.    Clinical Correlation: A normal EEG does not exclude a clinical diagnosis of epilepsy.  If further clinical questions remain, prolonged EEG may be helpful.  Clinical correlation is advised.   Patrcia DollyKaren Sagrario Lineberry, M.D.

## 2017-09-17 NOTE — Telephone Encounter (Signed)
Spoke with pt.  Relayed MRI and EEG results.  Advised that he would need 48 hour EEG.  Pt agreeable.  Appointment for Monday August 5 cancelled.

## 2017-09-17 NOTE — Telephone Encounter (Signed)
LMOM asking for return call.  Did not relay message below.    Also - pt scheduled for Monday August 5.  He does not need to come to this appointment.  He will need 48 hour EEG prior to next appointment.

## 2017-09-17 NOTE — Telephone Encounter (Signed)
Orders placed for ambulatory EEG.    Staff message sent to Martin BentSusan Reid, EEG technician to schedule.

## 2017-09-17 NOTE — Telephone Encounter (Signed)
-----   Message from Van ClinesKaren M Aquino, MD sent at 09/17/2017 10:05 AM EDT ----- Pls let patient know the MRI brain did not show any evidence of tumor, stroke, or bleed. The EEG was normal, however it is not like a pregnancy test that is positive or negative. When people continue have seizures with these normal tests, I do a prolonged 48-hour EEG to gather more EEG information and try to capture a seizure so we can see what the brain waves show when he is having a seizure. Recommend doing a 48-hour EEG, pls have him schedule. Thanks

## 2017-09-18 ENCOUNTER — Other Ambulatory Visit (HOSPITAL_BASED_OUTPATIENT_CLINIC_OR_DEPARTMENT_OTHER): Payer: Self-pay | Admitting: Family Medicine

## 2017-09-20 ENCOUNTER — Ambulatory Visit: Payer: Self-pay | Admitting: Neurology

## 2017-09-21 ENCOUNTER — Encounter (HOSPITAL_BASED_OUTPATIENT_CLINIC_OR_DEPARTMENT_OTHER): Payer: Self-pay | Admitting: NURSE PRACTITIONER

## 2017-09-23 DIAGNOSIS — F419 Anxiety disorder, unspecified: Secondary | ICD-10-CM | POA: Diagnosis not present

## 2017-09-26 ENCOUNTER — Other Ambulatory Visit (HOSPITAL_BASED_OUTPATIENT_CLINIC_OR_DEPARTMENT_OTHER): Payer: Self-pay | Admitting: Family Medicine

## 2017-09-30 ENCOUNTER — Other Ambulatory Visit (HOSPITAL_BASED_OUTPATIENT_CLINIC_OR_DEPARTMENT_OTHER): Payer: Self-pay | Admitting: Family Medicine

## 2017-09-30 DIAGNOSIS — E7800 Pure hypercholesterolemia, unspecified: Secondary | ICD-10-CM

## 2017-09-30 DIAGNOSIS — E7439 Other disorders of intestinal carbohydrate absorption: Secondary | ICD-10-CM

## 2017-09-30 DIAGNOSIS — I10 Essential (primary) hypertension: Secondary | ICD-10-CM

## 2017-09-30 NOTE — Telephone Encounter (Signed)
I called the patient and informed him I put in orders for fasting labs which he will obtain 2-3 days prior to his scheduled appointment.  Patient voiced his understanding.    Rosemarie Beath, MD  09/30/2017, 09:49

## 2017-10-07 ENCOUNTER — Ambulatory Visit: Payer: 59 | Attending: Family Medicine

## 2017-10-07 ENCOUNTER — Encounter (HOSPITAL_COMMUNITY): Payer: Self-pay | Admitting: Radiation Oncology

## 2017-10-07 DIAGNOSIS — I10 Essential (primary) hypertension: Secondary | ICD-10-CM | POA: Insufficient documentation

## 2017-10-07 DIAGNOSIS — E7439 Other disorders of intestinal carbohydrate absorption: Secondary | ICD-10-CM

## 2017-10-07 DIAGNOSIS — E7800 Pure hypercholesterolemia, unspecified: Secondary | ICD-10-CM | POA: Insufficient documentation

## 2017-10-07 LAB — BASIC METABOLIC PANEL, FASTING
ANION GAP: 10 mmol/L (ref 4–13)
BUN/CREA RATIO: 17 (ref 6–22)
BUN: 14 mg/dL (ref 8–25)
CALCIUM: 9.3 mg/dL (ref 8.5–10.2)
CHLORIDE: 102 mmol/L (ref 96–111)
CHLORIDE: 102 mmol/L (ref 96–111)
CO2 TOTAL: 27 mmol/L (ref 22–32)
CREATININE: 0.82 mg/dL (ref 0.62–1.27)
ESTIMATED GFR: >59 mL/min/1.73mˆ2 (ref >59)
GLUCOSE: 109 mg/dL — ABNORMAL HIGH (ref 70–105)
POTASSIUM: 4.4 mmol/L (ref 3.5–5.1)
SODIUM: 139 mmol/L (ref 136–145)

## 2017-10-07 LAB — ALT (SGPT): ALT (SGPT): 54 U/L (ref <55)

## 2017-10-07 LAB — AST (SGOT): AST (SGOT): 35 U/L (ref 8–48)

## 2017-10-07 LAB — LIPID PANEL
CHOL/HDL RATIO: 4.1
CHOLESTEROL: 161 mg/dL (ref <200)
HDL CHOL: 39 mg/dL (ref >=39)
LDL CALC: 71 mg/dL (ref <=100)
NON-HDL: 122 mg/dL (ref <=190)
TRIGLYCERIDES: 257 mg/dL — ABNORMAL HIGH (ref <=150)
VLDL CALC: 51 mg/dL — ABNORMAL HIGH (ref <30)

## 2017-10-11 DIAGNOSIS — M6282 Rhabdomyolysis: Secondary | ICD-10-CM | POA: Diagnosis not present

## 2017-10-11 DIAGNOSIS — J9600 Acute respiratory failure, unspecified whether with hypoxia or hypercapnia: Secondary | ICD-10-CM | POA: Insufficient documentation

## 2017-10-11 DIAGNOSIS — J984 Other disorders of lung: Secondary | ICD-10-CM | POA: Diagnosis not present

## 2017-10-11 DIAGNOSIS — A419 Sepsis, unspecified organism: Secondary | ICD-10-CM | POA: Diagnosis not present

## 2017-10-11 DIAGNOSIS — R6520 Severe sepsis without septic shock: Secondary | ICD-10-CM | POA: Diagnosis not present

## 2017-10-11 DIAGNOSIS — R4182 Altered mental status, unspecified: Secondary | ICD-10-CM | POA: Diagnosis not present

## 2017-10-11 DIAGNOSIS — J690 Pneumonitis due to inhalation of food and vomit: Secondary | ICD-10-CM | POA: Insufficient documentation

## 2017-10-11 DIAGNOSIS — Z4682 Encounter for fitting and adjustment of non-vascular catheter: Secondary | ICD-10-CM | POA: Diagnosis not present

## 2017-10-11 DIAGNOSIS — N179 Acute kidney failure, unspecified: Secondary | ICD-10-CM | POA: Insufficient documentation

## 2017-10-11 DIAGNOSIS — R918 Other nonspecific abnormal finding of lung field: Secondary | ICD-10-CM | POA: Diagnosis not present

## 2017-10-11 DIAGNOSIS — T50901A Poisoning by unspecified drugs, medicaments and biological substances, accidental (unintentional), initial encounter: Secondary | ICD-10-CM | POA: Insufficient documentation

## 2017-10-12 DIAGNOSIS — R6521 Severe sepsis with septic shock: Secondary | ICD-10-CM | POA: Diagnosis not present

## 2017-10-12 DIAGNOSIS — I368 Other nonrheumatic tricuspid valve disorders: Secondary | ICD-10-CM | POA: Diagnosis not present

## 2017-10-12 DIAGNOSIS — M6282 Rhabdomyolysis: Secondary | ICD-10-CM | POA: Diagnosis not present

## 2017-10-12 DIAGNOSIS — T50901A Poisoning by unspecified drugs, medicaments and biological substances, accidental (unintentional), initial encounter: Secondary | ICD-10-CM | POA: Diagnosis not present

## 2017-10-12 DIAGNOSIS — A419 Sepsis, unspecified organism: Secondary | ICD-10-CM | POA: Diagnosis not present

## 2017-10-12 DIAGNOSIS — J690 Pneumonitis due to inhalation of food and vomit: Secondary | ICD-10-CM | POA: Diagnosis not present

## 2017-10-12 DIAGNOSIS — N179 Acute kidney failure, unspecified: Secondary | ICD-10-CM | POA: Diagnosis not present

## 2017-10-12 DIAGNOSIS — I340 Nonrheumatic mitral (valve) insufficiency: Secondary | ICD-10-CM | POA: Diagnosis not present

## 2017-10-12 DIAGNOSIS — J9601 Acute respiratory failure with hypoxia: Secondary | ICD-10-CM | POA: Diagnosis not present

## 2017-10-12 DIAGNOSIS — R6520 Severe sepsis without septic shock: Secondary | ICD-10-CM | POA: Diagnosis not present

## 2017-10-12 DIAGNOSIS — J9602 Acute respiratory failure with hypercapnia: Secondary | ICD-10-CM | POA: Diagnosis not present

## 2017-10-13 DIAGNOSIS — R6520 Severe sepsis without septic shock: Secondary | ICD-10-CM | POA: Diagnosis not present

## 2017-10-13 DIAGNOSIS — J9601 Acute respiratory failure with hypoxia: Secondary | ICD-10-CM | POA: Diagnosis not present

## 2017-10-13 DIAGNOSIS — J690 Pneumonitis due to inhalation of food and vomit: Secondary | ICD-10-CM | POA: Diagnosis not present

## 2017-10-13 DIAGNOSIS — N179 Acute kidney failure, unspecified: Secondary | ICD-10-CM | POA: Diagnosis not present

## 2017-10-13 DIAGNOSIS — J9602 Acute respiratory failure with hypercapnia: Secondary | ICD-10-CM | POA: Diagnosis not present

## 2017-10-13 DIAGNOSIS — A419 Sepsis, unspecified organism: Secondary | ICD-10-CM | POA: Diagnosis not present

## 2017-10-13 DIAGNOSIS — T50901A Poisoning by unspecified drugs, medicaments and biological substances, accidental (unintentional), initial encounter: Secondary | ICD-10-CM | POA: Diagnosis not present

## 2017-10-13 DIAGNOSIS — M6282 Rhabdomyolysis: Secondary | ICD-10-CM | POA: Diagnosis not present

## 2017-10-14 DIAGNOSIS — N179 Acute kidney failure, unspecified: Secondary | ICD-10-CM | POA: Diagnosis not present

## 2017-10-14 DIAGNOSIS — J9601 Acute respiratory failure with hypoxia: Secondary | ICD-10-CM | POA: Diagnosis not present

## 2017-10-14 DIAGNOSIS — T50901A Poisoning by unspecified drugs, medicaments and biological substances, accidental (unintentional), initial encounter: Secondary | ICD-10-CM | POA: Diagnosis not present

## 2017-10-14 DIAGNOSIS — A419 Sepsis, unspecified organism: Secondary | ICD-10-CM | POA: Diagnosis not present

## 2017-10-14 DIAGNOSIS — J9602 Acute respiratory failure with hypercapnia: Secondary | ICD-10-CM | POA: Diagnosis not present

## 2017-10-14 DIAGNOSIS — J690 Pneumonitis due to inhalation of food and vomit: Secondary | ICD-10-CM | POA: Diagnosis not present

## 2017-10-14 DIAGNOSIS — R6520 Severe sepsis without septic shock: Secondary | ICD-10-CM | POA: Diagnosis not present

## 2017-10-14 DIAGNOSIS — M6282 Rhabdomyolysis: Secondary | ICD-10-CM | POA: Diagnosis not present

## 2017-10-15 DIAGNOSIS — J9602 Acute respiratory failure with hypercapnia: Secondary | ICD-10-CM | POA: Diagnosis not present

## 2017-10-15 DIAGNOSIS — J690 Pneumonitis due to inhalation of food and vomit: Secondary | ICD-10-CM | POA: Diagnosis not present

## 2017-10-15 DIAGNOSIS — N179 Acute kidney failure, unspecified: Secondary | ICD-10-CM | POA: Diagnosis not present

## 2017-10-15 DIAGNOSIS — T50901A Poisoning by unspecified drugs, medicaments and biological substances, accidental (unintentional), initial encounter: Secondary | ICD-10-CM | POA: Diagnosis not present

## 2017-10-15 DIAGNOSIS — R918 Other nonspecific abnormal finding of lung field: Secondary | ICD-10-CM | POA: Diagnosis not present

## 2017-10-15 DIAGNOSIS — J189 Pneumonia, unspecified organism: Secondary | ICD-10-CM | POA: Diagnosis not present

## 2017-10-15 DIAGNOSIS — R6520 Severe sepsis without septic shock: Secondary | ICD-10-CM | POA: Diagnosis not present

## 2017-10-15 DIAGNOSIS — A419 Sepsis, unspecified organism: Secondary | ICD-10-CM | POA: Diagnosis not present

## 2017-10-15 DIAGNOSIS — J9601 Acute respiratory failure with hypoxia: Secondary | ICD-10-CM | POA: Diagnosis not present

## 2017-10-15 DIAGNOSIS — M6282 Rhabdomyolysis: Secondary | ICD-10-CM | POA: Diagnosis not present

## 2017-10-16 DIAGNOSIS — A419 Sepsis, unspecified organism: Secondary | ICD-10-CM | POA: Diagnosis not present

## 2017-10-16 DIAGNOSIS — R6520 Severe sepsis without septic shock: Secondary | ICD-10-CM | POA: Diagnosis not present

## 2017-10-16 DIAGNOSIS — N179 Acute kidney failure, unspecified: Secondary | ICD-10-CM | POA: Diagnosis not present

## 2017-10-16 DIAGNOSIS — T50901A Poisoning by unspecified drugs, medicaments and biological substances, accidental (unintentional), initial encounter: Secondary | ICD-10-CM | POA: Diagnosis not present

## 2017-10-16 DIAGNOSIS — M6282 Rhabdomyolysis: Secondary | ICD-10-CM | POA: Diagnosis not present

## 2017-10-16 DIAGNOSIS — J690 Pneumonitis due to inhalation of food and vomit: Secondary | ICD-10-CM | POA: Diagnosis not present

## 2017-10-16 DIAGNOSIS — J9602 Acute respiratory failure with hypercapnia: Secondary | ICD-10-CM | POA: Diagnosis not present

## 2017-10-16 DIAGNOSIS — J9601 Acute respiratory failure with hypoxia: Secondary | ICD-10-CM | POA: Diagnosis not present

## 2017-10-17 DIAGNOSIS — M6282 Rhabdomyolysis: Secondary | ICD-10-CM | POA: Diagnosis not present

## 2017-10-17 DIAGNOSIS — J9601 Acute respiratory failure with hypoxia: Secondary | ICD-10-CM | POA: Diagnosis not present

## 2017-10-17 DIAGNOSIS — R6520 Severe sepsis without septic shock: Secondary | ICD-10-CM | POA: Diagnosis not present

## 2017-10-17 DIAGNOSIS — A419 Sepsis, unspecified organism: Secondary | ICD-10-CM | POA: Diagnosis not present

## 2017-10-17 DIAGNOSIS — J690 Pneumonitis due to inhalation of food and vomit: Secondary | ICD-10-CM | POA: Diagnosis not present

## 2017-10-17 DIAGNOSIS — T50901A Poisoning by unspecified drugs, medicaments and biological substances, accidental (unintentional), initial encounter: Secondary | ICD-10-CM | POA: Diagnosis not present

## 2017-10-17 DIAGNOSIS — J9602 Acute respiratory failure with hypercapnia: Secondary | ICD-10-CM | POA: Diagnosis not present

## 2017-10-17 DIAGNOSIS — N179 Acute kidney failure, unspecified: Secondary | ICD-10-CM | POA: Diagnosis not present

## 2017-10-18 DIAGNOSIS — J690 Pneumonitis due to inhalation of food and vomit: Secondary | ICD-10-CM | POA: Diagnosis not present

## 2017-10-18 DIAGNOSIS — F333 Major depressive disorder, recurrent, severe with psychotic symptoms: Secondary | ICD-10-CM | POA: Insufficient documentation

## 2017-10-19 ENCOUNTER — Encounter (HOSPITAL_BASED_OUTPATIENT_CLINIC_OR_DEPARTMENT_OTHER): Payer: Self-pay | Admitting: Family Medicine

## 2017-10-19 ENCOUNTER — Ambulatory Visit: Payer: 59 | Attending: Family Medicine | Admitting: Family Medicine

## 2017-10-19 ENCOUNTER — Ambulatory Visit (HOSPITAL_BASED_OUTPATIENT_CLINIC_OR_DEPARTMENT_OTHER): Payer: 59

## 2017-10-19 VITALS — BP 148/86 | HR 74 | Temp 97.7°F | Ht 69.0 in | Wt 297.0 lb

## 2017-10-19 DIAGNOSIS — Z23 Encounter for immunization: Secondary | ICD-10-CM

## 2017-10-19 DIAGNOSIS — C61 Malignant neoplasm of prostate: Secondary | ICD-10-CM | POA: Insufficient documentation

## 2017-10-19 DIAGNOSIS — G4730 Sleep apnea, unspecified: Secondary | ICD-10-CM

## 2017-10-19 DIAGNOSIS — M1612 Unilateral primary osteoarthritis, left hip: Secondary | ICD-10-CM

## 2017-10-19 DIAGNOSIS — R7309 Other abnormal glucose: Secondary | ICD-10-CM

## 2017-10-19 DIAGNOSIS — G4733 Obstructive sleep apnea (adult) (pediatric): Secondary | ICD-10-CM | POA: Insufficient documentation

## 2017-10-19 DIAGNOSIS — R21 Rash and other nonspecific skin eruption: Principal | ICD-10-CM

## 2017-10-19 DIAGNOSIS — Z79899 Other long term (current) drug therapy: Secondary | ICD-10-CM | POA: Insufficient documentation

## 2017-10-19 DIAGNOSIS — E785 Hyperlipidemia, unspecified: Secondary | ICD-10-CM | POA: Insufficient documentation

## 2017-10-19 DIAGNOSIS — I10 Essential (primary) hypertension: Secondary | ICD-10-CM | POA: Insufficient documentation

## 2017-10-19 DIAGNOSIS — E7800 Pure hypercholesterolemia, unspecified: Secondary | ICD-10-CM | POA: Insufficient documentation

## 2017-10-19 DIAGNOSIS — F333 Major depressive disorder, recurrent, severe with psychotic symptoms: Secondary | ICD-10-CM | POA: Diagnosis not present

## 2017-10-19 DIAGNOSIS — G40409 Other generalized epilepsy and epileptic syndromes, not intractable, without status epilepticus: Secondary | ICD-10-CM | POA: Diagnosis present

## 2017-10-19 DIAGNOSIS — S199XXA Unspecified injury of neck, initial encounter: Secondary | ICD-10-CM | POA: Diagnosis not present

## 2017-10-19 DIAGNOSIS — M47816 Spondylosis without myelopathy or radiculopathy, lumbar region: Secondary | ICD-10-CM | POA: Diagnosis not present

## 2017-10-19 DIAGNOSIS — Z62810 Personal history of physical and sexual abuse in childhood: Secondary | ICD-10-CM | POA: Diagnosis present

## 2017-10-19 DIAGNOSIS — J690 Pneumonitis due to inhalation of food and vomit: Secondary | ICD-10-CM | POA: Diagnosis not present

## 2017-10-19 DIAGNOSIS — S4991XA Unspecified injury of right shoulder and upper arm, initial encounter: Secondary | ICD-10-CM | POA: Diagnosis not present

## 2017-10-19 DIAGNOSIS — J189 Pneumonia, unspecified organism: Secondary | ICD-10-CM | POA: Diagnosis present

## 2017-10-19 DIAGNOSIS — R45851 Suicidal ideations: Secondary | ICD-10-CM | POA: Diagnosis present

## 2017-10-19 DIAGNOSIS — S3992XA Unspecified injury of lower back, initial encounter: Secondary | ICD-10-CM | POA: Diagnosis not present

## 2017-10-19 DIAGNOSIS — F332 Major depressive disorder, recurrent severe without psychotic features: Secondary | ICD-10-CM | POA: Diagnosis present

## 2017-10-19 DIAGNOSIS — Z981 Arthrodesis status: Secondary | ICD-10-CM | POA: Diagnosis not present

## 2017-10-19 DIAGNOSIS — G8929 Other chronic pain: Secondary | ICD-10-CM | POA: Diagnosis present

## 2017-10-19 DIAGNOSIS — F4310 Post-traumatic stress disorder, unspecified: Secondary | ICD-10-CM | POA: Diagnosis present

## 2017-10-19 DIAGNOSIS — K219 Gastro-esophageal reflux disease without esophagitis: Secondary | ICD-10-CM | POA: Diagnosis present

## 2017-10-19 DIAGNOSIS — S59901A Unspecified injury of right elbow, initial encounter: Secondary | ICD-10-CM | POA: Diagnosis not present

## 2017-10-19 LAB — HGA1C (HEMOGLOBIN A1C WITH EST AVG GLUCOSE)
ESTIMATED AVERAGE GLUCOSE: 114 mg/dL
HEMOGLOBIN A1C: 5.6 % (ref 4.0–5.6)

## 2017-10-19 MED ORDER — METOPROLOL TARTRATE 50 MG TABLET
50.00 mg | ORAL_TABLET | Freq: Two times a day (BID) | ORAL | 3 refills | Status: DC
Start: 2017-10-19 — End: 2018-09-19

## 2017-10-19 MED ORDER — ATORVASTATIN 40 MG TABLET
40.00 mg | ORAL_TABLET | Freq: Every evening | ORAL | 3 refills | Status: DC
Start: 2017-10-19 — End: 2018-11-21

## 2017-10-19 NOTE — Progress Notes (Signed)
Subjective:     Patient ID:  Henry [name] is an 62 y.o. male   Chief Complaint:    Chief Complaint   Patient presents with   . Medication follow up   . Jock Itch       Henry [name] is a 62 y.o. male who is here today to follow-up on chronic medical conditions and for a yearly examination.  He has a history of prostate cancer status post treatment and he is enrolled in a clinical trial.  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  He has hypertension and hypercholesterolemia.  Henry [name] it has sleep apnea, history of elevated glucose and is a candidate for Shingrix vaccine.  He has an acute complaint of rash location is in the groin over-the-counter topical preparations have helped but he still has an area that is pruritic and he believes is irritated.    ALLERGIES:   -- Sulfa (Sulfonamides) -- Rash   -- Shellfish Containing Products -- Itching and Swelling    --  Shrimp - eye swelling, itching    Current Outpatient Medications:  atorvastatin (LIPITOR) 40 mg Oral Tablet, Take 1 Tab (40 mg total) by mouth Every evening  diclofenac sodium (VOLTAREN) 75 mg Oral Tablet, Delayed Release (E.C.), Take 1 Tab (75 mg total) by mouth Twice daily  dicyclomine (BENTYL) 20 mg Oral Tablet, Take 1 Tab (20 mg total) by mouth Four times a day  Ferrous Sulfate (SLOW RELEASE IRON) 142 mg (45 mg iron) Oral Tablet Sustained Release, Take 1 Tab (142 mg total) by mouth Three times a day as needed  GLUCOS CHOND CPLX ADVANCED ORAL, qd  metoprolol tartrate (LOPRESSOR) 50 mg Oral Tablet, Take 1 Tab (50 mg total) by mouth Twice daily  metroNIDAZOLE (METROGEL) 1 % Gel with Pump, by Apply externally route Once a day  multivitamin Oral Tablet, Take 1 Tab by mouth Once a day  Omeprazole 20 mg Oral Tablet, Delayed Release (E.C.), take  by mouth.  traMADol (ULTRAM) 50 mg Oral Tablet, Take 1 Tab (50 mg total) by mouth Every 6 hours as needed for Pain (Patient not taking: Reported on 10/19/2017)    Past Medical  History:  11/2015: Cancer (CMS [place])      Comment:  prostate cancer  No date: CPAP (continuous positive airway pressure) dependence      Comment:  settings unknown  No date: GERD (gastroesophageal reflux disease)      Comment:  well controlled on omeprazole  No date: Heartburn  No date: HTN  No date: Hyperlipidemia  No date: Hypertension  No date: Irritable bowel syndrome  No date: Osteoarthritis of back  12/11/2015: Prostate cancer (CMS HCC)  No date: Sleep apnea  No date: Wears glasses    Past Surgical History:  No date: HX ANKLE FRACTURE TX; Right  No date: HX APPENDECTOMY  05/22/13: HX HIP REPLACEMENT; Right      Comment:  Per Dr. Caryl Comes  No date: PB COLONOSCOPY,DIAGNOSTIC    Social History    Socioeconomic History      Marital status: Married      Spouse name: Not on file      Number of children: Not on file      Years of education: Not on file      Highest education level: Not on file    Occupational History        Employer: [employer]    Tobacco Use  Smoking status: Never Smoker      Smokeless tobacco: Never Used      Tobacco comment: very rare cigar    Substance and Sexual Activity      Alcohol use: Yes        Alcohol/week: 2.0 standard drinks        Types: 2 Cans of beer per week        Comment: per week      Drug use: No    Other Topics      Concerns:        Abuse/Domestic Violence: No        Caffeine Concern: No        Calcium intake adequate: Yes        Computer Use: Yes        Drives: Yes        Exercise Concern: No        Seat Belt: Yes        Special Diet: No        Sunscreen used: Yes        Right hand dominant: Yes        Routine Exercise: No        Ability to Walk 2 Flight of Steps without SOB/CP: Yes        Ability To Do Own ADL's: Yes        Other Activity Level: Yes          14 steps at home. walks a lot. shoveled snow this winter. limited by pain.      Family Medical History:     Problem Relation (Age of Onset)    Asthma Father    COPD Father    Cancer Mother, Father    Coronary Artery  Disease Mother    Diabetes Mother    High Cholesterol Mother    Hypertension (High Blood Pressure) Mother    Migraines Mother                      Review of Systems   Constitutional: Negative.    HENT: Negative.    Eyes: Negative.    Respiratory: Negative.    Cardiovascular: Negative.    Gastrointestinal: Negative.    Genitourinary: Negative for dysuria, frequency and urgency.        Rash, puritis   Musculoskeletal: Positive for arthralgias.   Skin: Positive for rash.   Neurological: Negative.    Hematological: Negative.    Psychiatric/Behavioral: Negative.      Objective:   Physical Exam   Constitutional: He is oriented to person, place, and time. He appears well-developed and well-nourished.   BP (!) 148/86   Pulse 74   Temp 36.5 C (97.7 F) (Thermal Scan)   Ht 1.753 m (5' 9" ) Comment: per patient  Wt 134.7 kg (296 lb 15.4 oz)   SpO2 97%   BMI 43.85 kg/m      HENT:   Head: Normocephalic and atraumatic.   Mouth/Throat: Oropharynx is clear and moist.   Eyes: Pupils are equal, round, and reactive to light.   Neck: No thyromegaly present.   Cardiovascular: Normal rate, regular rhythm and normal heart sounds.   Pulmonary/Chest: Effort normal and breath sounds normal.   Abdominal: Soft. Bowel sounds are normal.   Genitourinary: Penis normal.   Genitourinary Comments: No visible rash was observed in the area of concern in his groin and on his scrotum   Musculoskeletal: Normal range of motion.  Neurological: He is alert and oriented to person, place, and time. No cranial nerve deficit.   Skin: Skin is warm and dry.   Psychiatric: He has a normal mood and affect. His behavior is normal. Thought content normal.     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 Administered today    (I10) Hypertension, unspecified type  Plan: metoprolol tartrate (LOPRESSOR) 50 mg Oral         Tablet        Increase metoprolol from 25 mg b.i.d. To 50 mg b.i.d. Repeat blood pressure in 6-8 weeks.

## 2017-10-19 NOTE — Progress Notes (Signed)
FAMILY MEDICINE, [place] [address]  Operated by [employer]  Progress Note    Name: Henry [name] MRN:  [id number]   Date: 10/19/2017 Age: 62 y.o.       The following student note was generated for teaching purposes only. Contents of this note are not to be used for billing purposes, except where allowed in Family, Medical, Social  histories.    Rosemarie Beath, MD  10/20/2017, 08:46            Reason for Visit: follow up and groin itch     History of Present Illness  Henry [name] is a 62 y.o. male who is being seen today for follow-up of chronic medical conditions, last seen by Dr. Angelia Mould 08/2016.     Groin itch-Pt returned from beach vacation approx 3 weeks ago and has had scrotal/inguinal crease itching consistent w/previous symptoms of jock itch. Has been using over the counter tinactin spray with some improvement of symptoms but normally has resolution in 2 weeks or less. No rash or lesion observed, unsure if he had bug bites in that area.     Prostate cancer s/p definitive RT + 6 mo ADT enrolled in clinical trial-Pt follows w/urology and was previously blinded to treatment. Now sees providers every 6 months for PSA and DRE. Denies urinary frequency, hesitancy, urgency, incontinence. Does wake up to urinate 1x/night but significantly improved from prior to diagnosis.     L hip pain w/DJD, left leg radicular pain- Pt follows w/[facility]. Last seen 06/2017 w/recommendations for PT, weight loss, left total hip replacement not currently planned. Due to follow-up with them 10/2017. Hip pain is at baseline-has increased pain w/persistent standing, bending over. Completed PT recommended by ortho and now does exercises at home. Also works out at [employer] 3-4x/week (for years) and both this and PT have significantly helped his pain. Is no longer taking tramadol.    HLD-adherent to current atorvastatin 71m  daily.     HTN-Adherent to current metoprolol. Doesn't check blood pressures at home. Has noticed elevated readings at past couple of doctor's appts. No headaches, vision changes, chest pain, SOB.     OSA on CPAP-Adherent to CPAP nightly, no complaints.    GERD-well controlled w/omeprazole MWF. No regurg, ab pain, n/v/d.     Current Outpatient Medications   Medication Sig   . atorvastatin (LIPITOR) 20 mg Oral Tablet TAKE 1 TAB (20 MG TOTAL) BY MOUTH EVERY EVENING   . diclofenac sodium (VOLTAREN) 75 mg Oral Tablet, Delayed Release (E.C.) Take 1 Tab (75 mg total) by mouth Twice daily   . dicyclomine (BENTYL) 20 mg Oral Tablet Take 1 Tab (20 mg total) by mouth Four times a day   . Ferrous Sulfate (SLOW RELEASE IRON) 142 mg (45 mg iron) Oral Tablet Sustained Release Take 1 Tab (142 mg total) by mouth Three times a day as needed   . GLUCOS CHOND CPLX ADVANCED ORAL qd   . metoprolol tartrate (LOPRESSOR) 25 mg Oral Tablet Take 1 Tab (25 mg total) by mouth Twice daily   . metroNIDAZOLE (METROGEL) 1 % Gel with Pump by Apply externally route Once a day   . multivitamin Oral Tablet Take 1 Tab by mouth Once a day   . Omeprazole 20 mg Oral Tablet, Delayed Release (E.C.) take  by mouth.   . traMADol (ULTRAM) 50 mg Oral Tablet Take 1 Tab (  50 mg total) by mouth Every 6 hours as needed for Pain     Allergies   Allergen Reactions   . Sulfa (Sulfonamides) Rash   . Shellfish Containing Products Itching and Swelling     Shrimp - eye swelling, itching       Physical Exam  Vitals:    10/19/17 1325   BP: (!) 148/86   Pulse: 74   Temp: 36.5 C (97.7 F)   TempSrc: Thermal Scan   SpO2: 97%   Weight: 134.7 kg (296 lb 15.4 oz)   Height: 1.753 m (5' 9" )   BMI: 43.94     Physical Exam   Constitutional: He is oriented to person, place, and time and well-developed, well-nourished, and in no distress.   HENT:   Nose: Nose normal.   Mouth/Throat: Oropharynx is clear and moist.   Eyes: Pupils are equal, round, and reactive to light. EOM are normal.      Neck: No JVD present. No thyromegaly present.   Cardiovascular: Normal rate and regular rhythm. Exam reveals no gallop and no friction rub.   No murmur heard.  Pulmonary/Chest: Breath sounds normal. No respiratory distress. He has no wheezes. He has no rales.   Abdominal: Soft. He exhibits no distension and no mass. There is no tenderness. There is no rebound.   Genitourinary:   Genitourinary Comments: Minimal erythema of scrotum, no folliculitis, lesions, rashes   Musculoskeletal: He exhibits no edema, tenderness or deformity.   Neurological: He is alert and oriented to person, place, and time.   Skin: Skin is warm and dry. No rash noted. No erythema.   Psychiatric: Mood and affect normal.           Assessment and Plan  Problem List Items Addressed This Visit        Cardiovascular System    Hyperlipemia    Relevant Medications    atorvastatin (LIPITOR) 40 mg Oral Tablet    Last lipid panel w/elevated triglycerides but LDL w/in goal range. Discussed diet modifications for hypertriglyceridemia. Increased dose to 49m so that pt is on moderate intensity statin for best cholesterol control.       Hypertension    Relevant Medications    metoprolol tartrate (LOPRESSOR) 50 mg Oral Tablet    Blood pressures not well-controlled on current regimen, increased metoprolol to 583mdaily. BP recheck in 2-3 months.        Respiratory    Sleep apnea  Continue w/current CPAP to best treat OSA and manage HTN. Pt to follow-up w/sleep disorder clinic 10/27/17.       Musculoskeletal    Primary osteoarthritis of left hip (Chronic)  Continue exercise regimen, PT, follow-up with ortho already scheduled on 11/02/17.       [facility]    [facility], repeat PSA/DRE not indicated today. Has next appt scheduled 11/25/17.      Other Visit Diagnoses     Rash of groin    -  Primary  Consistent w/tinea. Continue OTC tenactin and trial topical hydrocortisone in scrotal area/inguinal crease. If worsens, can return to  care for prescription of appropriate anti-fungal.     Elevated glucose        Relevant Orders    HGA1C (HEMOGLOBIN A1C WITH EST AVG GLUCOSE)  Will follow-up w/results for DM II screening-pt at risk given obesity and recent elevated fasting glucose. Discussed weight loss clinic w/patient-stated he would consider making an appt but not at this point.  Need for shingles vaccine        Relevant Orders    SHINGRIX - ZOSTER VACCINE (ADMIN) (Completed)    Pt to receive 2nd dose today, previously received 1st dose 08/27/16.        Health Maintenance  Colon cancer screening- Pt on waitlist for colonoscopy, to call scheduler again.   STD screening-Discussed recommendation for HIV screening w/pt, declined at this time. Previous Hep C ab screening negative.      Data Reviewed   Lab Results   Component Value Date    CHOLESTEROL 161 10/07/2017    CHOLESTEROL 159 05/28/2011    HDLCHOL 39 10/07/2017    HDLCHOL 40 05/28/2011    LDLCHOL 71 10/07/2017    LDLCHOL 87 05/28/2011    LDLCHOLDIR 65 07/04/2013    TRIG 257 (H) 10/07/2017    TRIG 161 (H) 05/28/2011      Lab Results   Component Value Date    HA1C 5.4 01/13/2010       Pt seen w/attending Dr. Angelia Mould.   Buel Ream, MED STUDENT

## 2017-10-27 ENCOUNTER — Ambulatory Visit (HOSPITAL_BASED_OUTPATIENT_CLINIC_OR_DEPARTMENT_OTHER): Payer: 59 | Admitting: NURSE PRACTITIONER

## 2017-10-27 ENCOUNTER — Encounter (HOSPITAL_BASED_OUTPATIENT_CLINIC_OR_DEPARTMENT_OTHER): Payer: Self-pay | Admitting: NURSE PRACTITIONER

## 2017-10-27 VITALS — BP 155/79 | HR 59 | Temp 97.4°F | Resp 14 | Ht 69.0 in | Wt 298.1 lb

## 2017-10-27 DIAGNOSIS — G4733 Obstructive sleep apnea (adult) (pediatric): Secondary | ICD-10-CM

## 2017-10-27 DIAGNOSIS — I10 Essential (primary) hypertension: Secondary | ICD-10-CM

## 2017-10-27 NOTE — Progress Notes (Signed)
Sleep [place]    Patient Name: Henry [name] [facility]  MRN#: [id number]  DOB: [birth year]  Date of Service: 10/27/2017    CC: Sleep Apnea        Last Clinic Visit: 09/22/16    Sleep Disorders Therapy:  Positive Pressure: CPAP 9 cm     Mask: Zest Nasal     Humidifier: Heated    Ramp: Yes    Oxyen: No       HPI: The patient is a 62 year old male with history of severe sleep apnea presenting for routine follow up.No concerns or complaints. Continues to do well on therapy. Wife mentions that he is puffing air from his mouth during sleep with the nasal mask.     Apnea Hypopnea index: 53.8 Desat 63%(01/23/10)    Current use of positive pressure therapy: every night    Hours of sleep per night: approx 5 hr to 6 hours a night     Problems with Therapy    Mask: None     Machine: None    Nasal: None    Epworth Sleepiness Scale:7    Sleep related symptoms:  Excessive day time sleepiness: Denies   Nonrestorative sleep: Denies  Witnessed apneas by bed partner: Denies   Awakening with choking: Denies   Nocturnal restlessness: Denies  Insomnia with frequent awakenings: Denies   Lack of concentration: Denies   Cognitive deficits: Denies   Changes in mood: Denies   Morning headaches: Denies   Vivid, strange, or threatening dreams Denies    Sleep structure:  - sleep latency 5 min, sleep maintenance 5 hours, Frequent night time awakening none  - excessive caffeine, soda, alcohol intake: No. Decaffeinated only             Associated diseases: Obesity (Body mass index is 44.02 kg/m.), GERD  Systemic complications:   Hypertension,   Cardiovascular disease,        PAP compliance report:(09/27/17-10/26/17)  DME: [facility]  CPAP   Setting: 9 cm of H2O  % of sleep >=4 hours use: 73.3%  Average usage (Days Used): 4 hours 27 mins 6 secs  Avg air leak time: 0 secs  Residual AHI: 0.5    PAST MEDICAL HISTORY:   Past Medical History:   Diagnosis Date   . Cancer (CMS [facility]) 11/2015    prostate  cancer   . CPAP (continuous positive airway pressure) dependence     settings unknown   . GERD (gastroesophageal reflux disease)     well controlled on omeprazole   . Heartburn    . HTN    . Hyperlipidemia    . Hypertension    . Irritable bowel syndrome    . Osteoarthritis of back    . Prostate cancer (CMS [place]) 12/11/2015   . Sleep apnea    . Wears glasses            ALLERGIES:   Allergies   Allergen Reactions   . Sulfa (Sulfonamides) Rash   . Shellfish Containing Products Itching and Swelling     Shrimp - eye swelling, itching         CURRENT MEDICATIONS:   Prior to Admission medications    Medication Sig Start Date End Date Taking? Authorizing Provider   atorvastatin (LIPITOR) 40 mg Oral Tablet Take 1 Tab (40 mg total) by mouth Every evening 10/19/17   Rosemarie Beath, MD   diclofenac sodium (VOLTAREN) 75 mg Oral Tablet, Delayed Release (E.C.) Take 1  Tab (75 mg total) by mouth Twice daily 07/05/17   Rosemarie Beath, MD   dicyclomine (BENTYL) 20 mg Oral Tablet Take 1 Tab (20 mg total) by mouth Four times a day 09/22/17   Rosemarie Beath, MD   Ferrous Sulfate (SLOW RELEASE IRON) 142 mg (45 mg iron) Oral Tablet Sustained Release Take 1 Tab (142 mg total) by mouth Three times a day as needed 04/26/14   Rosemarie Beath, MD   GLUCOS CHOND CPLX ADVANCED ORAL qd    Provider, Historical   metoprolol tartrate (LOPRESSOR) 50 mg Oral Tablet Take 1 Tab (50 mg total) by mouth Twice daily 10/19/17   Rosemarie Beath, MD   metroNIDAZOLE (METROGEL) 1 % Gel with Pump by Apply externally route Once a day    Provider, Historical   multivitamin Oral Tablet Take 1 Tab by mouth Once a day    Provider, Historical   Omeprazole 20 mg Oral Tablet, Delayed Release (E.C.) take  by mouth.    Provider, Historical   traMADol (ULTRAM) 50 mg Oral Tablet Take 1 Tab (50 mg total) by mouth Every 6 hours as needed for Pain  Patient not taking: Reported on 10/19/2017 06/22/17   Domingo Sep, PA-C       REVIEW OF SYSTEMS:  Constitutional: negative  Eyes:  negative  Ears, nose, mouth, throat, and face: negative  Respiratory: obstructive sleep apnea  Cardiovascular: negative  Gastrointestinal: negative  Genitourinary:negative  Integument/breast: negative  Hematologic/lymphatic: negative  Musculoskeletal:negative  Neurological: negative  Behavioral/Psych: negative  Endocrine: negative  Allergic/Immunologic: negative            PHYSICAL EXAM: The patient's current vitals are BP (!) 155/79   Pulse 59   Temp 36.3 C (97.4 F) (Thermal Scan)   Resp 14   Ht 1.753 m (5' 9" )   Wt 135.2 kg (298 lb 1 oz)   SpO2 96%   BMI 44.02 kg/m   General: NAD, good appearance   Eyes: Conjunctiva clear, EOMI.   Oropharynx: Oropharynx Mallampati Class I  Neck: 19Inches @ cirsothyroid membrane and neck supple  Lungs: clear to auscultation bilaterally  Cardiovascular: S1, S2, RRR without murmur  Abdomen: soft, non-tender, bowel sounds normal and non-tender   Extremities: no cyanosis, clubbing or edema  Skin: Skin warm and dry  Neurologic: grossly normal, AO x 3  Lymphatics: No nodes appreciated        ASSESSMENT/ PLAN:   Henry [name] is a 62 y.o. Male with hx of   1. Obstructive sleep apnea- At this time no change in pressure. He is doing well with pressure and AHI is normal. He may be leaking some air from his mouth with a nasal mask. He will talk to his wife and see if the changes in breathing on the mask are position or does he needs a CHIN STRAP.   *Good response and benefit of therapy  *Apnea well controlled.   *Continue to do well on positive airway pressure therapy and excellent subjective and objectivecompliance.  *Advise obtain replacement equipment every three to six months.   *Advised patient do not drive or operate heavy equipment or machinery if fatigued, drowsy or sleepy due to risk of injury.   2. Morbid obesity- fitness and healthy eating habits encouraged  3. Hypertension      RTC: one year     Pt was seen independently and cosigning physician available for  consultation.     Lorette Ang, APRN,FNP-BC   10/27/2017, 09:35  Gerrie Nordmann Wilson-Carr, APRN, FNP-BC  Family Nurse Practitioner  [facility]  Pulmonary and Sleep Medicine.  Department of Medicine          The patient was evaluated independently by the APRN. I was available for consultation.      Weldon Picking, MD  11/01/2017, 10:27

## 2017-10-29 ENCOUNTER — Encounter (HOSPITAL_BASED_OUTPATIENT_CLINIC_OR_DEPARTMENT_OTHER): Payer: Self-pay | Admitting: NURSE PRACTITIONER

## 2017-10-29 NOTE — Progress Notes (Signed)
Tech sent script for tech DT to [facility] for supplies    CPAP 9 cmH20  Heated humidification  ZEST NASAL MASK  Headgear  Filters  Tubing  Water chamber..... 10/29/17 TMS RRT RPSGT

## 2017-11-01 ENCOUNTER — Telehealth: Payer: Self-pay | Admitting: *Deleted

## 2017-11-01 NOTE — Telephone Encounter (Signed)
[place] ambulatory EEG at this time so he will wait for Medicare.

## 2017-11-02 ENCOUNTER — Encounter (HOSPITAL_BASED_OUTPATIENT_CLINIC_OR_DEPARTMENT_OTHER): Payer: Self-pay | Admitting: Orthopaedic Surgery

## 2017-11-02 ENCOUNTER — Encounter (HOSPITAL_BASED_OUTPATIENT_CLINIC_OR_DEPARTMENT_OTHER): Payer: Self-pay

## 2017-11-04 DIAGNOSIS — F17290 Nicotine dependence, other tobacco product, uncomplicated: Secondary | ICD-10-CM | POA: Diagnosis not present

## 2017-11-04 DIAGNOSIS — R945 Abnormal results of liver function studies: Secondary | ICD-10-CM | POA: Diagnosis not present

## 2017-11-04 DIAGNOSIS — R531 Weakness: Secondary | ICD-10-CM | POA: Diagnosis not present

## 2017-11-04 DIAGNOSIS — R7309 Other abnormal glucose: Secondary | ICD-10-CM | POA: Diagnosis not present

## 2017-11-04 DIAGNOSIS — R05 Cough: Secondary | ICD-10-CM | POA: Diagnosis not present

## 2017-11-08 ENCOUNTER — Telehealth: Payer: Self-pay | Admitting: Neurology

## 2017-11-08 NOTE — Telephone Encounter (Signed)
Returned call.  No answer.  LMOM asking for return call.  

## 2017-11-08 NOTE — Telephone Encounter (Signed)
Patient called regarding Numbness in Left Foot and into his Right hand. He would like further testing. Please Call. Thanks

## 2017-11-09 NOTE — Telephone Encounter (Addendum)
Pt returned my call.  He states that he has numbness in R foot, lower leg, and hand. This has been going on for roughly 3 weeks.  Now experiencing numbness in L hand.  States that he has no sensation in his R foot at all.  Example given - if he walks on gravel barefoot, he cannot feel the gravel with his R foot.  He is asking for testing to see if he possibly has a pinched nerve.  States that he has had multiple neck/back surgeries - broken neck in 1992 resulting in discectomies.  Advised I would send message to Dr. Karel JarvisAquino and return call with recommendation.

## 2017-11-10 ENCOUNTER — Telehealth: Payer: Self-pay | Admitting: Neurology

## 2017-11-12 NOTE — Telephone Encounter (Signed)
Pls let him know that his history of sciatica does mean he has a pinched nerve. Would recommend doing PT for his neck and back to help with his symptoms, and if they don't go away with PT, then would do nerve testing. Thanks

## 2017-11-12 NOTE — Telephone Encounter (Signed)
Spoke with pt relaying message below.  He states that he would like to do a little research on PT places before I send referral as he is unsure if he would like PT in [place] or [employer].  Pt states he will call with his decision sometime next week.

## 2017-11-16 ENCOUNTER — Other Ambulatory Visit: Payer: Self-pay

## 2017-11-16 ENCOUNTER — Encounter (HOSPITAL_COMMUNITY): Payer: Self-pay | Admitting: Emergency Medicine

## 2017-11-16 ENCOUNTER — Emergency Department (HOSPITAL_COMMUNITY)
Admission: EM | Admit: 2017-11-16 | Discharge: 2017-11-16 | Disposition: A | Discharge status: Home / Self Care | Payer: Medicare Other | Attending: Emergency Medicine | Admitting: Emergency Medicine

## 2017-11-16 DIAGNOSIS — G40909 Epilepsy, unspecified, not intractable, without status epilepticus: Secondary | ICD-10-CM | POA: Insufficient documentation

## 2017-11-16 DIAGNOSIS — R4182 Altered mental status, unspecified: Secondary | ICD-10-CM | POA: Diagnosis not present

## 2017-11-16 DIAGNOSIS — R410 Disorientation, unspecified: Secondary | ICD-10-CM | POA: Diagnosis not present

## 2017-11-16 DIAGNOSIS — F17210 Nicotine dependence, cigarettes, uncomplicated: Secondary | ICD-10-CM | POA: Diagnosis not present

## 2017-11-16 DIAGNOSIS — Z79899 Other long term (current) drug therapy: Secondary | ICD-10-CM | POA: Insufficient documentation

## 2017-11-16 LAB — CBC WITH DIFFERENTIAL/PLATELET
ABS IMMATURE GRANULOCYTES: 0.1 10*3/uL (ref 0.0–0.1)
BASOS ABS: 0 10*3/uL (ref 0.0–0.1)
BASOS PCT: 1 %
EOS ABS: 0 10*3/uL (ref 0.0–0.7)
Eosinophils Relative: 1 %
HCT: 35 % — ABNORMAL LOW (ref 39.0–52.0)
Hemoglobin: 11.2 g/dL — ABNORMAL LOW (ref 13.0–17.0)
IMMATURE GRANULOCYTES: 1 %
Lymphocytes Relative: 21 %
Lymphs Abs: 1.4 10*3/uL (ref 0.7–4.0)
MCH: 30.1 pg (ref 26.0–34.0)
MCHC: 32 g/dL (ref 30.0–36.0)
MCV: 94.1 fL (ref 78.0–100.0)
Monocytes Absolute: 0.6 10*3/uL (ref 0.1–1.0)
Monocytes Relative: 9 %
NEUTROS ABS: 4.3 10*3/uL (ref 1.7–7.7)
NEUTROS PCT: 67 %
PLATELETS: 284 10*3/uL (ref 150–400)
RBC: 3.72 MIL/uL — ABNORMAL LOW (ref 4.22–5.81)
RDW: 13.7 % (ref 11.5–15.5)
WBC: 6.4 10*3/uL (ref 4.0–10.5)

## 2017-11-16 LAB — COMPREHENSIVE METABOLIC PANEL
ALBUMIN: 3.2 g/dL — AB (ref 3.5–5.0)
ALK PHOS: 41 U/L (ref 38–126)
ALT: 23 U/L (ref 0–44)
ANION GAP: 6 (ref 5–15)
AST: 21 U/L (ref 15–41)
BILIRUBIN TOTAL: 0.5 mg/dL (ref 0.3–1.2)
BUN: 23 mg/dL (ref 8–23)
CALCIUM: 9.1 mg/dL (ref 8.9–10.3)
CO2: 28 mmol/L (ref 22–32)
Chloride: 105 mmol/L (ref 98–111)
Creatinine, Ser: 0.63 mg/dL (ref 0.61–1.24)
GFR calc non Af Amer: >60 mL/min (ref >60)
Glucose, Bld: 104 mg/dL — ABNORMAL HIGH (ref 70–99)
POTASSIUM: 4.2 mmol/L (ref 3.5–5.1)
Sodium: 139 mmol/L (ref 135–145)
TOTAL PROTEIN: 6.5 g/dL (ref 6.5–8.1)

## 2017-11-16 LAB — ETHANOL

## 2017-11-16 LAB — URINALYSIS, ROUTINE W REFLEX MICROSCOPIC
Bilirubin Urine: NEGATIVE
GLUCOSE, UA: NEGATIVE mg/dL
Hgb urine dipstick: NEGATIVE
Ketones, ur: NEGATIVE mg/dL
LEUKOCYTES UA: NEGATIVE
Nitrite: NEGATIVE
PROTEIN: NEGATIVE mg/dL
Specific Gravity, Urine: 1.017 (ref 1.005–1.030)
pH: 6 (ref 5.0–8.0)

## 2017-11-16 LAB — CK: CK TOTAL: 53 U/L (ref 49–397)

## 2017-11-16 MED ORDER — SODIUM CHLORIDE 0.9 % IV BOLUS
1000.0000 mL | Freq: Once | INTRAVENOUS | Status: AC
Start: 2017-11-16 — End: 2017-11-16
  Administered 2017-11-16: 1000 mL via INTRAVENOUS

## 2017-11-16 NOTE — ED Notes (Signed)
Pt in room complaining he's hungry and wants breakfast. Explained to patient we do not order breakfast for ED patients and his workup is still in progress. Patient then began yelling at me, "you mean to tell me if I get let go from here I have to go to the store buy my own food, bring it home and cook it." Told patient he would get a [place] sandwich if he was discharged. Pt proceeded to yell at this RN that "this is the worst hospital visit he has ever had and he's never been denied food." Pt requested a pillow which he was given and is resting in bed sleeping.

## 2017-11-16 NOTE — ED Provider Notes (Signed)
[name] EMERGENCY DEPARTMENT Provider Note   CSN: [id number] Arrival date & time: 11/16/17  4098     History   Chief Complaint Chief Complaint  Patient presents with  . Altered Mental Status    HPI Martin Singleton is a 62 y.o. male with seizures, PTSD, spinal stenosis and history of suicidal and homicidal ideation who presents for evaluation of "seizures." He reports new onset seizures in February and is being followed by an outpatient neurologist. Recent brain MRI was normal. He was on Depakote but his seizures persisted so medication was discontinued. He is unable to report a frequency of his seizures and states that sometimes he will go weeks without a seizure. Last night he reports having multiple seizures, unwitnessed. He states that he knew he had seizures because he had very restless sleep with vivid dreams and woke up thinking he was in a doctor's office when he was actually at home in his bed. He states that prior seizures have been witnessed and he will have total body shaking, amnesia, and be unable to speak. He denies urinary or bowel incontinence or tongue biting.   He smokes mariajuana 2-3 times/day for anxiety. Morphine and klonipin were discontinued two months ago by his doctor and he self-discontinued doxepin and gabapentin several months ago. He reports trying multiple antidepressants for his mood but has been unable to tolerate them due to side effects.   ROS is negative for chest pain, sob, weakness, dysarthria, dysphagia, cough, fever, chills, n/v/d.   ROS is positive for numbness in bilateral lower extremities and bilateral fingers.    He notes a family history of seizure disorder in his father. States his father had "petit mal and was treated with dilantin."     HPI  Past Medical History:  Diagnosis Date  . Cluster headaches    entered in Seroquel study at [facility] 9-09  . Colon polyps    5 tubular adenomas on 11-06-11  . Degenerative disc  disease, thoracic   . Low back pain    from Spinal stenosis  . Neck pain    from cervical spondylosis  . Other social stressor    with depression  . Pure hypercholesterolemia   . Seizures [facility])     Patient Active Problem List   Diagnosis Date Noted  . Homicidal ideations   . 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 CERVICAL DISCECTOMY    . COLONOSCOPY    . KNEE ARTHROSCOPY          Home Medications    Prior to Admission medications   Medication Sig Start Date End Date Taking? Authorizing Provider  omeprazole (PRILOSEC) 20 MG capsule Take 40 mg by mouth daily.   Yes [provider]  cholecalciferol (VITAMIN D) 400 UNITS TABS tablet Take 400 Units by mouth. Taking 3 capsules    [provider]  clonazePAM (KLONOPIN) 1 MG tablet Take 2 mg by mouth as needed.     [provider]  DOXEPIN HCL PO Take 1 tablet by mouth daily.    [provider]  gabapentin (NEURONTIN) 300 MG capsule Take 1 capsule (300 mg total) by mouth 3 (three) times daily. 08/19/17   Laveda Abbe, NP  MORPHINE SULFATE PO Take 45 mg by mouth.     [provider]  Multiple Vitamins-Minerals (CENTRUM SILVER) tablet Take 1 tablet by mouth daily.  [provider]  Omega-3 1000 MG CAPS Take 1 tablet by mouth daily.    [provider]  promethazine (PHENERGAN) 25 MG tablet Take 25 mg by mouth every 8 (eight) hours as needed for nausea.  04/05/08   [provider]  simvastatin (ZOCOR) 40 MG tablet Take 1 tablet by mouth daily.     [provider]    Family History Father seizure disorder  Social History Social History   Tobacco Use  . Smoking status: Current Some Day Smoker    Types: Cigars  . Smokeless tobacco: Never Used  Substance Use Topics  . Alcohol use: Yes    Comment: weekly  . Drug use: Yes    Types: Marijuana      Allergies   Codeine; Hydrocodone bitartrate; Lyrica [pregabalin]; Methadone hcl; Nucynta [tapentadol]; Sansert [methysergide]; and Xanax [alprazolam]   Review of Systems Review of Systems A complete ROS was negative except as per HPI.  General: Denies fever, chills, fatigue, diaphoresis. + decreased appetite + weight loss Respiratory: Denies SOB, cough, DOE Cardiovascular: Denies chest pain  Gastrointestinal: Denies nausea, vomiting, abdominal pain, diarrhea Musculoskeletal:  + back pain, Denies joint swelling, arthralgias and gait problem.   Neurological: Denies dizziness, headaches, weakness, lightheadedness, + numbness, + seizures Psychiatric/Behavioral: Denies confusion, nervousness, suicidal or homicidal ideation, and agitation. + depressed mood.    Physical Exam Updated Vital Signs BP 136/81   Pulse 69   Temp 98.4 F (36.9 C) (Oral)   Resp 16   Ht 6\' 1"  (1.854 m)   Wt 65.8 kg   SpO2 99%   BMI 19.13 kg/m   Physical Exam Vitals:   11/16/17 0800 11/16/17 0815 11/16/17 0915 11/16/17 1030  BP: 108/87 113/75 (!) 119/59 136/81  Pulse: 65 70 69 69  Resp: 12 17 16 16   Temp:      TempSrc:      SpO2: 98% 99% 99% 99%  Weight:      Height:       General: Vital signs reviewed.  Patient is well-developed and well-nourished, in no acute distress and cooperative with exam. Laying in bed, appears depressed and irritated Head: Normocephalic and atraumatic. Eyes: EOMI, conjunctivae normal, no scleral icterus. EOMI. PERRLA Cardiovascular: RRR, S1 normal, S2 normal, no murmurs, gallops, or rubs. Pulmonary/Chest: Clear to auscultation bilaterally, no wheezes, rales, or rhonchi. Abdominal: Soft, non-tender, non-distended, BS +, no masses, organomegaly, or guarding present.  Extremities: No lower extremity edema bilaterally,  pulses symmetric and intact bilaterally.  Neurological: A&O x3, Strength is normal but bilateral lower extremities exhibit jerking movements on strength  testing and + clonus bilaterally. Patellar and achilles reflexes normal and symmetric. Cranial nerve II-XII are grossly intact, no focal motor deficit, sensory intact to light touch bilaterally, but slightly decreased sensation on right lower extremity. Hearing is decreased on right.  Skin: Warm, dry and intact.  Psychiatric: Depressed mood and affect. speech and behavior is normal. Cognition and memory are normal.    ED Treatments / Results  Labs (all labs ordered are listed, but only abnormal results are displayed) Labs Reviewed  COMPREHENSIVE METABOLIC PANEL - Abnormal; Notable for the following components:      Result Value   Glucose, Bld 104 (*)    Albumin 3.2 (*)    All other components within normal limits  CBC WITH DIFFERENTIAL/PLATELET - Abnormal; Notable for the following components:   RBC 3.72 (*)    Hemoglobin 11.2 (*)    HCT 35.0 (*)  All other components within normal limits  ETHANOL  URINALYSIS, ROUTINE W REFLEX MICROSCOPIC  CK    EKG EKG Interpretation  Date/Time:  Tuesday November 16 2017 07:23:39 EDT Ventricular Rate:  68 PR Interval:    QRS Duration: 90 QT Interval:  410 QTC Calculation: 436 R Axis:   78 Text Interpretation:  Sinus rhythm RSR' in V1 or V2, probably normal variant ST-t wave abnormality Artifact Abnormal ekg Confirmed by Gerhard Munch [phone]) on 11/16/2017 7:42:08 AM   Radiology No results found.  Procedures Procedures (including critical care time)  Medications Ordered in ED Medications  sodium chloride 0.9 % bolus 1,000 mL (0 mLs Intravenous Stopped 11/16/17 0931)     Initial Impression / Assessment and Plan / ED Course  I have reviewed the triage vital signs and the nursing notes.  Pertinent labs & imaging results that were available during my care of the patient were reviewed by me and considered in my medical decision making (see chart for details).    62yoM with reported history of seizures who presents for evaluation of  presumed seizure like activity that was unwitnessed overnight. Denies recent trauma or medication changes. Has used marijuana several times a day for the past 50 years.   No seizure activity was observed in the ED. Electrolytes and CK were normal. CBC without signs of infection. Albumin slightly low reflecting his reported decreased appetite and weight loss over the last year. He was given 1L NS bolus and observed for further seizure activity. He was discharged home with close outpatient neurology follow up and is in the process of getting a 48hr sleep EEG study scheduled.   He was given seizure and return precautions. Told not to drive, operative machinery, climb ladders, or swim. He was also told to increase dietary protein and is already drinking several ensures a day.     Final Clinical Impressions(s) / ED Diagnoses   Final diagnoses:  Seizure disorder [facility])    ED Discharge Orders    None       Ali Lowe, MD 11/16/17 1211    Gerhard Munch, MD 11/19/17 2148

## 2017-11-16 NOTE — ED Triage Notes (Signed)
Pt arrives via EMS from home. Reports thinking he had multiple seizures last night. Reports he woke up thinking he was at the neurologists office but was actually in bed. States called nurse line and was confused and told to come to ED. Pt states just started having seizures Feb this year but does not take medications for seizures. Denies recent falls/trauma, no injury to tongue noted, no urinary incontinence, "seizures" last night unwitnessed. Today was standing in front yard on EMS arrival. Pt awake, alert, appropriate. EKG NSR for EMS VSS. NAD at present.

## 2017-11-16 NOTE — Discharge Instructions (Addendum)
I advise you to refrain from driving or operating any vehicles, avoid ladders or swimming. Your lab work was normal, no electrolyte abnormalities or signs of infection.   Please follow up with your neurologist for continued workup and management. If symptoms return or worsen, do not hesitate to return to the emergency department.

## 2017-11-16 NOTE — ED Notes (Signed)
Pt given two [place] sandwiches and coke. Discussed discharge/followup instructions with patient. Wheeled to lobby in stable condition where patient will call his sister for a ride home.

## 2017-11-23 ENCOUNTER — Other Ambulatory Visit (HOSPITAL_COMMUNITY): Payer: Self-pay | Admitting: Radiation Oncology

## 2017-11-23 DIAGNOSIS — Z006 Encounter for examination for normal comparison and control in clinical research program: Secondary | ICD-10-CM

## 2017-11-23 DIAGNOSIS — C61 Malignant neoplasm of prostate: Secondary | ICD-10-CM

## 2017-11-23 DIAGNOSIS — Z923 Personal history of irradiation: Secondary | ICD-10-CM

## 2017-11-25 ENCOUNTER — Encounter (HOSPITAL_COMMUNITY): Payer: Self-pay | Admitting: Radiation Oncology

## 2017-11-25 ENCOUNTER — Inpatient Hospital Stay
Admission: RE | Admit: 2017-11-25 | Discharge: 2017-11-25 | Disposition: A | Discharge status: Still In Hospital | Payer: 59 | Source: Ambulatory Visit | Attending: Radiation Oncology | Admitting: Radiation Oncology

## 2017-11-25 DIAGNOSIS — Z882 Allergy status to sulfonamides status: Secondary | ICD-10-CM | POA: Insufficient documentation

## 2017-11-25 DIAGNOSIS — Z08 Encounter for follow-up examination after completed treatment for malignant neoplasm: Secondary | ICD-10-CM | POA: Insufficient documentation

## 2017-11-25 DIAGNOSIS — C61 Malignant neoplasm of prostate: Secondary | ICD-10-CM

## 2017-11-25 DIAGNOSIS — Z79899 Other long term (current) drug therapy: Secondary | ICD-10-CM | POA: Insufficient documentation

## 2017-11-25 DIAGNOSIS — Z8546 Personal history of malignant neoplasm of prostate: Secondary | ICD-10-CM | POA: Insufficient documentation

## 2017-11-25 DIAGNOSIS — Z91013 Allergy to seafood: Secondary | ICD-10-CM | POA: Insufficient documentation

## 2017-11-25 DIAGNOSIS — Z006 Encounter for examination for normal comparison and control in clinical research program: Secondary | ICD-10-CM

## 2017-11-25 DIAGNOSIS — Z923 Personal history of irradiation: Secondary | ICD-10-CM | POA: Insufficient documentation

## 2017-11-25 LAB — PSA, DIAGNOSTIC: PSA: 0.4 ng/mL (ref <=4.0)

## 2017-11-25 NOTE — Cancer Center Note (Signed)
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 Since treatment his frequency and obstructive urinary symptoms have resolved. Bowel movements are also back to normal. His PSAincreased slightly from 0.2 to 0.3on 02/23/17 and 0.4 on 05/25/17 and is again 0.4 on 11/25/17. He denies any other symptoms.    Medications:  Current Outpatient Medications   Medication Sig   . atorvastatin (LIPITOR) 40 mg Oral Tablet Take 1 Tab (40 mg total) by mouth Every evening   . diclofenac sodium (VOLTAREN) 75 mg Oral Tablet, Delayed Release (E.C.) Take 1 Tab (75 mg total) by mouth Twice daily   . dicyclomine (BENTYL) 20 mg Oral Tablet Take 1 Tab (20 mg total) by mouth Four times a day   . Ferrous Sulfate (SLOW RELEASE IRON) 142 mg (45 mg iron) Oral Tablet Sustained Release Take 1 Tab (142 mg total) by mouth Three times a day as needed   . GLUCOS CHOND CPLX ADVANCED ORAL qd   . metoprolol tartrate (LOPRESSOR) 50 mg Oral Tablet Take 1 Tab (50 mg total) by mouth Twice daily   . metroNIDAZOLE (METROGEL) 1 % Gel with Pump by Apply externally route Once a day   . multivitamin Oral Tablet Take 1 Tab by mouth Once a day   . Omeprazole 20 mg Oral Tablet, Delayed Release (E.C.) take  by mouth.   . traMADol (ULTRAM) 50 mg Oral Tablet Take 1 Tab (50 mg total) by mouth Every 6 hours as needed for Pain       Allergies   Allergen Reactions   . Sulfa (Sulfonamides) Rash   . Shellfish Containing Products Itching and  Swelling     Shrimp - eye swelling, itching       Review of Systems:  Constitutional:  negative for fevers, chills, anorexia, fatigue and weight loss  Eyes:  negative for visual disturbance and irritation  Ears, nose, mouth, throat:  negative for hearing loss, nasal congestion, sore throat, hoarseness and voice change  Cardiac/vascular:  negative for chest pain, palpitations and lower extremity edema  Respiratory:  negative for hemoptysis, pleurisy/chest pain, cough and dyspnea  Gastrointestinal:  negative for dysphagia, nausea, vomiting, melena, diarrhea and abdominal pain  Genitourinary:  negative for dysuria, urinary incontinence and hematuria  Musculoskeletal:  negative for myalgias, muscle weakness, neck pain, back pain and hip pain   Neurological:  negative for seizures, speech problems, gait problems, dizziness and headaches  Behavioral/Psych:  negative for behavior problems  Hematologic/lymphatic:  negative for bleeding and lymphadenopathy  Integumentary (skin, breast):  negative for rash, skin lesion(s), pruritus and dryness  Endocrine:  negative  Allergic/Immunologic:  negative      Objective:     Vitals:    11/25/17 0843   BP: (!) 170/79   Pulse: 72   Temp: 36.7 C (98.1 F)   SpO2: 95%   Weight: 135.3 kg (298 lb 4.5 oz)  Pain Score (Numeric, Faces): 3  CONSTITUTIONAL: No acute distress, ECOG (0) Fully active, able to carry on all predisease performance without restriction  EYES: conjunctiva clear, pupils equal round and reactive to light  ENT: Normocephalic, atraumatic, moist oral cavity mucous membranes  NECK: No masses or palpable lymphadenopathy  RESPIRATORY: Clear to auscultation bilaterally  CARDIOVASCULAR:Regular rate and rhythm, no murmurs appreciated.  GASTROINTESTINAL: Soft, non-tender, non-distended, no palpable hepatosplenomegaly or masses.    GENITOURINARY: no palpable abnormality in the prostate  MUSCULOSKELETAL:  No tenderness to palpation in joints or spine.    NEUROLOGIC: AOx3, cranial  nerves II-XII intact, grossly normal motor and sensory exams  LYMPHATICS: No palpable lymphadenopathy  PSYCHIATRIC: Pleasant, normal affect    LABS/IMAGING: All relevant labs and imaging were reviewed as per HPI.     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 There is no clinical or biochemical evidence of recurrence. He will follow-up in53month with repeat PSA. Also, the patient agrees to participate in [id number] signed consent today. He will have a CBC with other blood draw in early November and assuming blood counts are normal we will draw the study blood soon thereafter. I personally saw and examined the patient, and reviewed all recent findings with him.  I spent greater than 50% of a 15 minute visit in discussion of the patient's diagnosis and management.    MRexanne Mano MLenore Manner MD 11/25/2017, 11:01

## 2017-11-25 NOTE — Progress Notes (Signed)
RTOG 0924-Pt was in for 18 month follow up appt.  PSA was drawn and then physician came in to perform physical exam.  AEs reviewed with patient:    Lt hip pain gr1 is still present.  Denies any worsening.  His PCP follows this pain and a hip replacement may be in his future.    Pt denies urinary frequency, urgency, diarrhea or constipation or any other new problems.  Medications have not changed since he was last seen by Dr. Lenore Manner.      CBC, chemistries or scanning not indicated for today's visit.  Dr. Lenore Manner performed physical to include DRE.  PSA results not back yet at the time of this note.  Pt has my chart and will be notified of result through that venue.  Pt to return to the clinic in 6 months as per protocol.  Pt has my number and the number of radiation therapy and will call with any new concerns or problems.  Lorn Junes, RN

## 2017-11-25 NOTE — Cancer Center Note (Signed)
Consent Documentation  Consent statements/Questions YES NO NA   1.  Study Name [id number] - - -   2.  Date   11/25/2017 - - -   3. Preferred language of subject/LAR ENGLISH - - -   4.  Protocol and consent form/s were thoroughly discussed by the treating physician and    Consent discussion included the following people   X     5.  Discussion included purpose of study, risks, benefits, treatment alternatives, medications, study procedures, study duration and voluntary participation?   X     6.  Authorization for use and disclosure of personally identified health information (HIPPA) was discussed? X     7. Subject/LAR was given ample time to read the consent/s in private, to ask questions and to have questions answered.             8.  Did the patient take the consent form home?  X           9.   Did the subject/LAR have any specific questions?    X          10.  Questions were answered to patient satisfaction?   X   11.   Costs associated with the study were discussed with the subject/LAR? X     12.   The subject/LAR was able to verbalize an understanding of the main purpose of the study and the treatment plan? X     13.   The subject/LAR was alert, oriented and freely consented to study participation? X     14.   All boxes or questions on the consent form/s were answered? X     15.   Each page of the consent form/s was initialed and dated with the correct date by subject/LAR?  X     16.   The consent form/s was signed and dated by the subject/LAR and treating physician? X     17.   Consent/s was signed before any study specific procedures were performed? X     18.   Subject/LAR was given a signed copy of the consent form/s and a copy was placed for scanning into EPIC? X     19.   Subject/LAR was provided with study staff contact information? X     20.  Additional comments - - -   Signature Marin Olp, LPN   - - -

## 2017-12-20 ENCOUNTER — Ambulatory Visit: Payer: 59 | Attending: Radiation Oncology

## 2017-12-20 DIAGNOSIS — C61 Malignant neoplasm of prostate: Secondary | ICD-10-CM | POA: Insufficient documentation

## 2017-12-20 LAB — CBC
HCT: 44.1 % (ref 38.9–52.0)
HGB: 14.7 g/dL (ref 13.4–17.5)
MCH: 28.4 pg (ref 26.0–32.0)
MCHC: 33.3 g/dL (ref 31.0–35.5)
MCV: 85.3 fL (ref 78.0–100.0)
MPV: 9 fL (ref 8.7–12.5)
PLATELETS: 239 x10ˆ3/uL (ref 150–400)
RBC: 5.17 x10ˆ6/uL (ref 4.50–6.10)
RDW-CV: 13.2 % (ref 11.5–15.5)
WBC: 5.9 x10ˆ3/uL (ref 3.7–11.0)

## 2017-12-21 ENCOUNTER — Ambulatory Visit: Payer: 59 | Attending: Family Medicine | Admitting: Family Medicine

## 2017-12-21 VITALS — BP 138/86 | HR 69 | Temp 97.7°F | Ht 67.0 in | Wt 298.5 lb

## 2017-12-21 DIAGNOSIS — Z8546 Personal history of malignant neoplasm of prostate: Secondary | ICD-10-CM | POA: Insufficient documentation

## 2017-12-21 DIAGNOSIS — K589 Irritable bowel syndrome without diarrhea: Secondary | ICD-10-CM | POA: Insufficient documentation

## 2017-12-21 DIAGNOSIS — Z79899 Other long term (current) drug therapy: Secondary | ICD-10-CM | POA: Insufficient documentation

## 2017-12-21 DIAGNOSIS — K219 Gastro-esophageal reflux disease without esophagitis: Secondary | ICD-10-CM | POA: Insufficient documentation

## 2017-12-21 DIAGNOSIS — I10 Essential (primary) hypertension: Secondary | ICD-10-CM | POA: Insufficient documentation

## 2017-12-21 DIAGNOSIS — E785 Hyperlipidemia, unspecified: Secondary | ICD-10-CM

## 2017-12-21 DIAGNOSIS — C61 Malignant neoplasm of prostate: Secondary | ICD-10-CM

## 2017-12-21 DIAGNOSIS — M1710 Unilateral primary osteoarthritis, unspecified knee: Secondary | ICD-10-CM

## 2017-12-21 DIAGNOSIS — Z1211 Encounter for screening for malignant neoplasm of colon: Secondary | ICD-10-CM

## 2017-12-21 DIAGNOSIS — E119 Type 2 diabetes mellitus without complications: Secondary | ICD-10-CM | POA: Insufficient documentation

## 2017-12-21 DIAGNOSIS — Z23 Encounter for immunization: Secondary | ICD-10-CM | POA: Insufficient documentation

## 2017-12-21 DIAGNOSIS — M179 Osteoarthritis of knee, unspecified: Secondary | ICD-10-CM

## 2017-12-21 DIAGNOSIS — M1612 Unilateral primary osteoarthritis, left hip: Secondary | ICD-10-CM

## 2017-12-21 DIAGNOSIS — Z9989 Dependence on other enabling machines and devices: Secondary | ICD-10-CM

## 2017-12-21 DIAGNOSIS — G4733 Obstructive sleep apnea (adult) (pediatric): Secondary | ICD-10-CM

## 2017-12-21 NOTE — Progress Notes (Signed)
Subjective:     Patient ID:  Selvin Yun is an 62 y.o. male   Chief Complaint:    Chief Complaint   Patient presents with   . Diabetes Follow up       Marissa Lowrey is a 62 y.o. male here in follow-up for diabetes and other chronic medical conditions.  He has a history of prostate cancer that is in remission he also has hypertension hyperlipidemia as well as joint pain due to osteoarthritis.  His last hemoglobin A1c checked in September 2019 was 5.5.  He continues to get adequate relief to his osteoarthritis with diclofenac and glucosamine.  He is currently on Lipitor 40 mg a day for high cholesterol and he takes dicyclomine for irritable bowel and omeprazole for reflux.  He has worse a show a and uses metronidazole when the.  He is in good spirits today.    ALLERGIES:   -- Sulfa (Sulfonamides) -- Rash   -- Shellfish Containing Products -- Itching and Swelling    --  Shrimp - eye swelling, itching    Current Outpatient Medications:  atorvastatin (LIPITOR) 40 mg Oral Tablet, Take 1 Tab (40 mg total) by mouth Every evening  diclofenac sodium (VOLTAREN) 75 mg Oral Tablet, Delayed Release (E.C.), Take 1 Tab (75 mg total) by mouth Twice daily  dicyclomine (BENTYL) 20 mg Oral Tablet, Take 1 Tab (20 mg total) by mouth Four times a day  Ferrous Sulfate (SLOW RELEASE IRON) 142 mg (45 mg iron) Oral Tablet Sustained Release, Take 1 Tab (142 mg total) by mouth Three times a day as needed  GLUCOS CHOND CPLX ADVANCED ORAL, qd  metoprolol tartrate (LOPRESSOR) 50 mg Oral Tablet, Take 1 Tab (50 mg total) by mouth Twice daily  metroNIDAZOLE (METROGEL) 1 % Gel with Pump, by Apply externally route Once a day  multivitamin Oral Tablet, Take 1 Tab by mouth Once a day  Omeprazole 20 mg Oral Tablet, Delayed Release (E.C.), take  by mouth.  traMADol (ULTRAM) 50 mg Oral Tablet, Take 1 Tab (50 mg total) by mouth Every 6 hours as needed for Pain (Patient not taking: Reported on 12/21/2017)                Review of Systems      Constitutional: Negative.    Eyes: Negative.    Gastrointestinal: Positive for constipation and diarrhea. Negative for nausea.   Endocrine: Negative for cold intolerance, heat intolerance, polydipsia, polyphagia and polyuria.   Musculoskeletal: Positive for back pain.   Neurological: Positive for headaches.   Psychiatric/Behavioral: Negative.      Objective:   Physical Exam   Constitutional: He is oriented to person, place, and time. He appears well-developed and well-nourished.   BP 138/86   Pulse 69   Temp 36.5 C (97.7 F) (Thermal Scan)   Ht 1.702 m (5' 7" )   Wt 135.4 kg (298 lb 8.1 oz)   SpO2 96%   BMI 46.75 kg/m      HENT:   Mouth/Throat: Oropharynx is clear and moist.   Eyes: Pupils are equal, round, and reactive to light.   Neck: Neck supple. No thyromegaly present.   Cardiovascular: Normal rate and regular rhythm.   Pulmonary/Chest: Effort normal and breath sounds normal.   Abdominal: Soft.   Musculoskeletal: He exhibits no edema.   Lymphadenopathy:     He has no cervical adenopathy.   Neurological: He is alert and oriented to person, place, and time. No cranial nerve deficit.  Skin: Skin is warm and dry.   Psychiatric: He has a normal mood and affect. His behavior is normal.     Ortho Exam    Assessment & Plan:     (I10) Hypertension, unspecified type  (primary encounter diagnosis)  Plan:  Continue current care blood pressure 138/84.    (K58.9) Irritable bowel syndrome, unspecified type  Plan: ENDOSCOPY REQUEST        I had requested colonoscopy in April of last year he has never heard from GI about scheduling I have reordered that and explained to him that there is a tremendous back log for colonoscopy he has no symptoms it is just his    (Z12.11) Colon cancer screening  Plan: ENDOSCOPY REQUEST        Screening time.    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

## 2017-12-21 NOTE — Nursing Note (Addendum)
1. Are you 62 years of age or older? yes  2. Have you ever had a severe reaction to a flu shot? no  3. Are you allergic to eggs? no  4. Are you allergic to latex? no  5. Are you allergic to Thimerosol? no  6. Are you experiencing acute illness symptoms or have you been running a fever? no  7. Do you have a medical condition or taking medications that suppress your immune system? no  8. Have you ever had Guillain-Barre syndrome or other neurologic disorder? no  9. Are you pregnant or breastfeeding? no    Immunization administered     Name Date Dose VIS Date Route    INFLUENZA VACCINE IM 12/21/2017 0.5 mL 09/30/2017 Intramuscular    Site: Left deltoid    Given By: Hedy Camara, LPN    Manufacturer: [employer]    Lot: [id number]    NDC: [id number]          Annalea Alguire Lewie Chamber, LPN 03/21/3433, 68:61

## 2017-12-21 NOTE — Student (Signed)
Department of Family Medicine   Clinic Progress Note    Henry [name]  MRN: [id number]  DOB: Nov 03, 1955  Date of Service: 12/21/2017       The following student note was generated for teaching purposes only. Contents of this note are not to be used for billing purposes, except where allowed in Family, Medical, Social  histories.    Henry Beath, MD  12/24/2017, 13:05      [place]  Chief Complaint   Patient presents with   . Follow up       SUBJECTIVE  Henry [name] is a 62 y.o. male with PMH of prostate cancer (in remission), hypertension, hyperlipidemia, and osteoarthritis who presents to clinic for follow-up on elevated fasting glucose and other comorbidities. At today's visit, the patient is requesting to have a screening colonoscopy and flu vaccination.    Elevated glucose: Patient reports having an elevated glucose at last appointment with Dr. Angelia Mould. He had an HgbA1c checked on October 19, 2017 that was a 5.5.     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 He takes declofenac 75 mg and Glucosamin OTC to control the pain and is managed by [facility] orthopaedic department.     Hypertension: Patient had a dosage adjustment last visit from Metoprolol 25 mg to 50 mg twice a day. He reports tolerating the medication well and denies any dizziness, chest pain, dyspnea, N/V/D, or urinary issues such as hematuria or dysuria. Henry [name] regularly checks his blood pressure at [facility], where he exercises three times per week. However, he measures his blood pressure after working out, which tends to be elevated to the 160s/90s.     Hyperlipidemia: Patient had a dosage adjustment last visit from Lipitor 20 mg to 40 mg once a day. He reports tolerating the medication well and denies any worsening myalgias or fatigue.     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    PAST MEDICAL HISTORY:  Illnesses:   Hypertension   Osteoarthritis   GERD   IBS   Hyperlipidemia    Rosacea    Prostate Cancer (in remission)    Medications:     Outpatient Medications Prior to Visit:  atorvastatin (LIPITOR) 40 mg Oral Tablet Take 1 Tab (40 mg total) by mouth Every evening   diclofenac sodium (VOLTAREN) 75 mg Oral Tablet, Delayed Release (E.C.) Take 1 Tab (75 mg total) by mouth Twice daily   dicyclomine (BENTYL) 20 mg Oral Tablet Take 1 Tab (20 mg total) by mouth Four times a day   Ferrous Sulfate (SLOW RELEASE IRON) 142 mg (45 mg iron) Oral Tablet Sustained Release Take 1 Tab (142 mg total) by mouth Three times a day as needed   GLUCOS CHOND CPLX ADVANCED ORAL qd   metoprolol tartrate (LOPRESSOR) 50 mg Oral Tablet Take 1 Tab (50 mg total) by mouth Twice daily   metroNIDAZOLE (METROGEL) 1 % Gel with Pump by Apply externally route Once a day   multivitamin Oral Tablet Take 1 Tab by mouth Once a day   Omeprazole 20 mg Oral Tablet, Delayed Release (E.C.) take  by mouth.   traMADol (ULTRAM) 50 mg Oral Tablet Take 1 Tab (50 mg total) by mouth Every 6 hours as needed for Pain (Patient not taking: Reported on 12/21/2017)  No facility-administered medications prior to visit.     Surgeries:  Past Surgical History:   Procedure Laterality Date   . ARTHROPLASTY HIP TOTAL Right 05/22/2013    Performed by Jacqualine Code, MD at [place] TRANSRECTAL WITH NEEDLE N/A 11/07/2015    Performed by Renee Rival, MD at [place]   . HX ANKLE FRACTURE TX Right    . HX APPENDECTOMY     . 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 Caryl Comes   . INSERTION HYDROGEL SPACER N/A 01/30/2016    Performed by Renee Rival, MD at [place]   . IR ARTHROGRAM - HIP LEFT Left 06/08/2017    Performed by Claris Gower, MD at [place]   . IR ARTHROGRAM - HIP LEFT Left 09/27/2014    Performed by Claris Gower, MD at [place]   . IR ARTHROGRAM - HIP LEFT Left 05/02/2014    Performed by Claris Gower, MD at [place]   . PB COLONOSCOPY,DIAGNOSTIC         Allergies:   Allergies   Allergen Reactions   . Sulfa (Sulfonamides) Rash   . Shellfish Containing Products Itching and Swelling     Shrimp - eye swelling, itching       SOCIAL HISTORY:  Smoking history  - Never smoker     Alcohol use  - Consumes one beer per month    Illicit drug use  - No history of illicit drug use    Review of Systems:  Constitutional: No fevers, chills, or fatigue  HEENT: No visual disturbance, otalgia, nasal congestion or sore throat  Respiratory: No cough or dyspnea  Cardiovascular: No chest pain or palpitations  Gastrointestinal: History of reflux symptoms and change in bowel habits from IBS. No recent N/V/D  Genitourinary: No dysuria or hematuria  Integument: No rashes. History of Rosacea   Musculoskeletal: History of arthritis of the hips and spine. No myalgias   Neurological: Some headaches attributed to C2/3 osteoarthritis involvement. No paresthesia or weakness  Behavioral/Psych: No anxiety or depression  Endocrine: No polyuria, polydipsia, and temperature intolerance    OBJECTIVE    Weight: 135.4 kg (298 lb 8.1 oz) Body mass index is 46.75 kg/m.  Vitals:    12/21/17 1351   BP: 138/86   Pulse: 69   Temp: 36.5 C (97.7 F)   TempSrc: Thermal Scan   SpO2: 96%   Weight: 135.4 kg (298 lb 8.1 oz)   Height: 1.702 m (5' 7" )   BMI: 46.85     General: Well appearing, no acute distress   HENT: TMs clear, mouth mucous membranes moist, pharynx without injection or exudate   Lungs: clear to auscultation bilaterally, no wheezing  Cardiovascular: RRR, no murmurs appreciated  Abdomen: soft, normoactive bowel sounds, non tender to palpation  Extremities: no cyanosis or edema  Skin: warm and dry, no rash  Neurologic: gait is normal, AOx3, CN 2-12 grossly intact  Psychiatric: normal affect and behavior    Health Issues  List:  -Hypertension  -History of prostate cancer  -Hyperlipidemia  -Osteoarthritis     ASSESSMENT/PLAN  Henry [name] 62 year old male with PMH of HTN, HLD, osteoarthritis, and prostate cancer (in remission) who is presenting for annual wellness visit. He has no new concerns at today's visit.     Elevated glucose:   - HgbA1c checked on October 19, 2017 that was a  5.5.   - Patient advised to continue exercise regimen and healthy eating (ie. 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   - Continue taking declofenac 75 mg and Glucosamin OTC to control the pain   - Continue to follow with [facility] orthopaedic department.     Hypertension:   - Patient tolerating dosage adjustment Metoprolol 50 mg twice a day.   - Blood pressure today 138/86; which is a reasonable measurement for his age range (ideal less than 140/90).   - Continue to monitor home blood pressure     Hyperlipidemia:   - Patient tolerating dosage adjustment to Lipitor 40 mg once a day.   - Continue taking the Lipitor 40 mg    Patient to RTC in six months or prn.  Patient communicates an understanding of the treatment plan and is in agreement.  Patient reports all questions were answered to his satisfaction.    Winferd Humphrey, MED STUDENT  12/21/2017, 14:28

## 2017-12-22 DIAGNOSIS — L03115 Cellulitis of right lower limb: Secondary | ICD-10-CM | POA: Diagnosis not present

## 2017-12-22 DIAGNOSIS — E46 Unspecified protein-calorie malnutrition: Secondary | ICD-10-CM | POA: Diagnosis not present

## 2017-12-22 DIAGNOSIS — G8191 Hemiplegia, unspecified affecting right dominant side: Secondary | ICD-10-CM | POA: Diagnosis not present

## 2017-12-22 DIAGNOSIS — R531 Weakness: Secondary | ICD-10-CM | POA: Insufficient documentation

## 2017-12-22 DIAGNOSIS — L89216 Pressure-induced deep tissue damage of right hip: Secondary | ICD-10-CM | POA: Diagnosis not present

## 2017-12-22 DIAGNOSIS — F332 Major depressive disorder, recurrent severe without psychotic features: Secondary | ICD-10-CM | POA: Diagnosis not present

## 2017-12-22 DIAGNOSIS — M25551 Pain in right hip: Secondary | ICD-10-CM | POA: Diagnosis not present

## 2017-12-22 DIAGNOSIS — L02415 Cutaneous abscess of right lower limb: Secondary | ICD-10-CM | POA: Diagnosis not present

## 2017-12-23 ENCOUNTER — Other Ambulatory Visit (HOSPITAL_BASED_OUTPATIENT_CLINIC_OR_DEPARTMENT_OTHER): Payer: Self-pay | Admitting: Family Medicine

## 2017-12-23 DIAGNOSIS — L97919 Non-pressure chronic ulcer of unspecified part of right lower leg with unspecified severity: Secondary | ICD-10-CM | POA: Diagnosis not present

## 2017-12-23 DIAGNOSIS — Z6821 Body mass index (BMI) 21.0-21.9, adult: Secondary | ICD-10-CM | POA: Diagnosis not present

## 2017-12-23 DIAGNOSIS — F4310 Post-traumatic stress disorder, unspecified: Secondary | ICD-10-CM | POA: Diagnosis present

## 2017-12-23 DIAGNOSIS — Z79899 Other long term (current) drug therapy: Secondary | ICD-10-CM | POA: Diagnosis not present

## 2017-12-23 DIAGNOSIS — L03115 Cellulitis of right lower limb: Secondary | ICD-10-CM | POA: Diagnosis present

## 2017-12-23 DIAGNOSIS — Z23 Encounter for immunization: Secondary | ICD-10-CM | POA: Diagnosis not present

## 2017-12-23 DIAGNOSIS — Z888 Allergy status to other drugs, medicaments and biological substances status: Secondary | ICD-10-CM | POA: Diagnosis not present

## 2017-12-23 DIAGNOSIS — G8921 Chronic pain due to trauma: Secondary | ICD-10-CM | POA: Diagnosis not present

## 2017-12-23 DIAGNOSIS — F332 Major depressive disorder, recurrent severe without psychotic features: Secondary | ICD-10-CM | POA: Diagnosis present

## 2017-12-23 DIAGNOSIS — M869 Osteomyelitis, unspecified: Secondary | ICD-10-CM | POA: Diagnosis not present

## 2017-12-23 DIAGNOSIS — E46 Unspecified protein-calorie malnutrition: Secondary | ICD-10-CM | POA: Diagnosis present

## 2017-12-23 DIAGNOSIS — K219 Gastro-esophageal reflux disease without esophagitis: Secondary | ICD-10-CM | POA: Diagnosis present

## 2017-12-23 DIAGNOSIS — L02415 Cutaneous abscess of right lower limb: Secondary | ICD-10-CM | POA: Diagnosis present

## 2017-12-23 DIAGNOSIS — G8191 Hemiplegia, unspecified affecting right dominant side: Secondary | ICD-10-CM | POA: Diagnosis present

## 2017-12-23 DIAGNOSIS — M87051 Idiopathic aseptic necrosis of right femur: Secondary | ICD-10-CM | POA: Diagnosis not present

## 2017-12-23 DIAGNOSIS — Z981 Arthrodesis status: Secondary | ICD-10-CM | POA: Diagnosis not present

## 2017-12-23 DIAGNOSIS — S71001A Unspecified open wound, right hip, initial encounter: Secondary | ICD-10-CM | POA: Diagnosis not present

## 2017-12-23 DIAGNOSIS — L89216 Pressure-induced deep tissue damage of right hip: Secondary | ICD-10-CM | POA: Diagnosis present

## 2017-12-23 DIAGNOSIS — G8929 Other chronic pain: Secondary | ICD-10-CM | POA: Diagnosis present

## 2017-12-23 DIAGNOSIS — A488 Other specified bacterial diseases: Secondary | ICD-10-CM | POA: Diagnosis not present

## 2017-12-23 DIAGNOSIS — R531 Weakness: Secondary | ICD-10-CM | POA: Diagnosis not present

## 2017-12-23 DIAGNOSIS — Z886 Allergy status to analgesic agent status: Secondary | ICD-10-CM | POA: Diagnosis not present

## 2017-12-23 DIAGNOSIS — F17290 Nicotine dependence, other tobacco product, uncomplicated: Secondary | ICD-10-CM | POA: Diagnosis present

## 2017-12-24 DIAGNOSIS — M87051 Idiopathic aseptic necrosis of right femur: Secondary | ICD-10-CM | POA: Diagnosis not present

## 2017-12-26 ENCOUNTER — Other Ambulatory Visit (HOSPITAL_BASED_OUTPATIENT_CLINIC_OR_DEPARTMENT_OTHER): Payer: Self-pay | Admitting: Family Medicine

## 2017-12-27 NOTE — Nursing Note (Signed)
Eligibility clarification note for [id number]  Patient is a 62 year old male with a confirmed history of prostate cancer previously treated with radiation therapy.   No evidence of recurrent active malignancy at this time.  Zubrod performance level at time of consent is 0.  Labs completed 12/20/2017 with results within normal limits.   12/20/2017 10:17  WBC: 5.9  HGB: 14.7  HCT: 44.1  PLATELET COUNT: 239  RBC: 5.17  MCV: 85.3  MCHC: 33.3  MCH: 28.4  RDW-CV: 13.2  MPV: 9.0  Patient will be in the experimental group having had prior radiation therapy through [place].   Patient received 45 Gy in 25 fractions and completed therapy 05/06/2016.  Patient currently in good health and denies any complaints of cold, flu, sore throat, or respiratory infection.  No active infection with HIV, Hep B, Hep C, or Human T-Cell Leukemia- Lymphoma virus.   Patient has not been on antibiotics in the past 14 days.   No prior cytotoxic chemotherapy, targeted therapy, or immunotherapy.   Patient alert and oriented and shows no cognitive deficit that would impair the signing of informed consent.

## 2017-12-28 ENCOUNTER — Encounter (HOSPITAL_COMMUNITY): Payer: Self-pay | Admitting: Radiation Oncology

## 2017-12-28 NOTE — Cancer Center Note (Signed)
Patient seen in radiation. Patient previously consented to the [id number] study on 11/25/2017.  Patient reconsented with the updated consent today, and labs drawn for the study [id number] by this nurse.  Patient tolerated lab draw well without complaints. Patient advised to contact me with any questions/concerns.   Marin Olp, LPN

## 2018-01-03 DIAGNOSIS — S71001A Unspecified open wound, right hip, initial encounter: Secondary | ICD-10-CM | POA: Insufficient documentation

## 2018-01-07 DIAGNOSIS — S91302D Unspecified open wound, left foot, subsequent encounter: Secondary | ICD-10-CM | POA: Diagnosis not present

## 2018-01-07 DIAGNOSIS — F17210 Nicotine dependence, cigarettes, uncomplicated: Secondary | ICD-10-CM | POA: Diagnosis not present

## 2018-01-07 DIAGNOSIS — S71001D Unspecified open wound, right hip, subsequent encounter: Secondary | ICD-10-CM | POA: Diagnosis not present

## 2018-01-07 DIAGNOSIS — F4310 Post-traumatic stress disorder, unspecified: Secondary | ICD-10-CM | POA: Diagnosis not present

## 2018-01-07 DIAGNOSIS — G40909 Epilepsy, unspecified, not intractable, without status epilepticus: Secondary | ICD-10-CM | POA: Diagnosis not present

## 2018-01-07 DIAGNOSIS — M4800 Spinal stenosis, site unspecified: Secondary | ICD-10-CM | POA: Diagnosis not present

## 2018-01-07 DIAGNOSIS — S81001D Unspecified open wound, right knee, subsequent encounter: Secondary | ICD-10-CM | POA: Diagnosis not present

## 2018-01-07 DIAGNOSIS — L03115 Cellulitis of right lower limb: Secondary | ICD-10-CM | POA: Diagnosis not present

## 2018-01-07 DIAGNOSIS — Z8701 Personal history of pneumonia (recurrent): Secondary | ICD-10-CM | POA: Diagnosis not present

## 2018-01-07 DIAGNOSIS — S81002D Unspecified open wound, left knee, subsequent encounter: Secondary | ICD-10-CM | POA: Diagnosis not present

## 2018-01-07 DIAGNOSIS — K219 Gastro-esophageal reflux disease without esophagitis: Secondary | ICD-10-CM | POA: Diagnosis not present

## 2018-01-07 DIAGNOSIS — S91301D Unspecified open wound, right foot, subsequent encounter: Secondary | ICD-10-CM | POA: Diagnosis not present

## 2018-01-07 DIAGNOSIS — F329 Major depressive disorder, single episode, unspecified: Secondary | ICD-10-CM | POA: Diagnosis not present

## 2018-01-12 DIAGNOSIS — S81002D Unspecified open wound, left knee, subsequent encounter: Secondary | ICD-10-CM | POA: Diagnosis not present

## 2018-01-12 DIAGNOSIS — S91301D Unspecified open wound, right foot, subsequent encounter: Secondary | ICD-10-CM | POA: Diagnosis not present

## 2018-01-12 DIAGNOSIS — L03115 Cellulitis of right lower limb: Secondary | ICD-10-CM | POA: Diagnosis not present

## 2018-01-12 DIAGNOSIS — S71001D Unspecified open wound, right hip, subsequent encounter: Secondary | ICD-10-CM | POA: Diagnosis not present

## 2018-01-12 DIAGNOSIS — S91302D Unspecified open wound, left foot, subsequent encounter: Secondary | ICD-10-CM | POA: Diagnosis not present

## 2018-01-12 DIAGNOSIS — S81001D Unspecified open wound, right knee, subsequent encounter: Secondary | ICD-10-CM | POA: Diagnosis not present

## 2018-01-14 DIAGNOSIS — S81002D Unspecified open wound, left knee, subsequent encounter: Secondary | ICD-10-CM | POA: Diagnosis not present

## 2018-01-14 DIAGNOSIS — S91301D Unspecified open wound, right foot, subsequent encounter: Secondary | ICD-10-CM | POA: Diagnosis not present

## 2018-01-14 DIAGNOSIS — S91302D Unspecified open wound, left foot, subsequent encounter: Secondary | ICD-10-CM | POA: Diagnosis not present

## 2018-01-14 DIAGNOSIS — S71001D Unspecified open wound, right hip, subsequent encounter: Secondary | ICD-10-CM | POA: Diagnosis not present

## 2018-01-14 DIAGNOSIS — L03115 Cellulitis of right lower limb: Secondary | ICD-10-CM | POA: Diagnosis not present

## 2018-01-14 DIAGNOSIS — S81001D Unspecified open wound, right knee, subsequent encounter: Secondary | ICD-10-CM | POA: Diagnosis not present

## 2018-01-17 DIAGNOSIS — S91301D Unspecified open wound, right foot, subsequent encounter: Secondary | ICD-10-CM | POA: Diagnosis not present

## 2018-01-17 DIAGNOSIS — Z87891 Personal history of nicotine dependence: Secondary | ICD-10-CM | POA: Diagnosis not present

## 2018-01-17 DIAGNOSIS — Z79899 Other long term (current) drug therapy: Secondary | ICD-10-CM | POA: Diagnosis not present

## 2018-01-17 DIAGNOSIS — Z885 Allergy status to narcotic agent status: Secondary | ICD-10-CM | POA: Diagnosis not present

## 2018-01-17 DIAGNOSIS — S81002D Unspecified open wound, left knee, subsequent encounter: Secondary | ICD-10-CM | POA: Diagnosis not present

## 2018-01-17 DIAGNOSIS — Z981 Arthrodesis status: Secondary | ICD-10-CM | POA: Diagnosis not present

## 2018-01-17 DIAGNOSIS — Z888 Allergy status to other drugs, medicaments and biological substances status: Secondary | ICD-10-CM | POA: Diagnosis not present

## 2018-01-17 DIAGNOSIS — K219 Gastro-esophageal reflux disease without esophagitis: Secondary | ICD-10-CM | POA: Diagnosis not present

## 2018-01-17 DIAGNOSIS — S81001D Unspecified open wound, right knee, subsequent encounter: Secondary | ICD-10-CM | POA: Diagnosis not present

## 2018-01-17 DIAGNOSIS — S71001D Unspecified open wound, right hip, subsequent encounter: Secondary | ICD-10-CM | POA: Diagnosis not present

## 2018-01-17 DIAGNOSIS — S91302D Unspecified open wound, left foot, subsequent encounter: Secondary | ICD-10-CM | POA: Diagnosis not present

## 2018-01-17 DIAGNOSIS — L03115 Cellulitis of right lower limb: Secondary | ICD-10-CM | POA: Diagnosis not present

## 2018-01-18 DIAGNOSIS — L89210 Pressure ulcer of right hip, unstageable: Secondary | ICD-10-CM | POA: Insufficient documentation

## 2018-01-19 DIAGNOSIS — S71001D Unspecified open wound, right hip, subsequent encounter: Secondary | ICD-10-CM | POA: Diagnosis not present

## 2018-01-19 DIAGNOSIS — S91302D Unspecified open wound, left foot, subsequent encounter: Secondary | ICD-10-CM | POA: Diagnosis not present

## 2018-01-19 DIAGNOSIS — L89309 Pressure ulcer of unspecified buttock, unspecified stage: Secondary | ICD-10-CM | POA: Insufficient documentation

## 2018-01-19 DIAGNOSIS — L03115 Cellulitis of right lower limb: Secondary | ICD-10-CM | POA: Diagnosis not present

## 2018-01-19 DIAGNOSIS — S81002D Unspecified open wound, left knee, subsequent encounter: Secondary | ICD-10-CM | POA: Diagnosis not present

## 2018-01-19 DIAGNOSIS — S81001D Unspecified open wound, right knee, subsequent encounter: Secondary | ICD-10-CM | POA: Diagnosis not present

## 2018-01-19 DIAGNOSIS — S91301D Unspecified open wound, right foot, subsequent encounter: Secondary | ICD-10-CM | POA: Diagnosis not present

## 2018-01-25 DIAGNOSIS — Z046 Encounter for general psychiatric examination, requested by authority: Secondary | ICD-10-CM | POA: Insufficient documentation

## 2018-01-31 ENCOUNTER — Ambulatory Visit: Payer: Self-pay | Admitting: Neurology

## 2018-04-08 ENCOUNTER — Inpatient Hospital Stay (HOSPITAL_COMMUNITY)
Admission: RE | Admit: 2018-04-08 | Discharge: 2018-04-08 | Disposition: A | Discharge status: Home / Self Care | Payer: 59 | Source: Ambulatory Visit

## 2018-04-08 ENCOUNTER — Encounter (HOSPITAL_COMMUNITY): Payer: Self-pay

## 2018-04-08 HISTORY — DX: Other general symptoms and signs: R68.89

## 2018-04-08 HISTORY — DX: Acute embolism and thrombosis of unspecified deep veins of unspecified lower extremity: I82.409

## 2018-04-08 HISTORY — DX: Other chronic pain: G89.29

## 2018-04-12 ENCOUNTER — Other Ambulatory Visit (HOSPITAL_BASED_OUTPATIENT_CLINIC_OR_DEPARTMENT_OTHER): Payer: Self-pay | Admitting: Family Medicine

## 2018-04-12 ENCOUNTER — Encounter (HOSPITAL_BASED_OUTPATIENT_CLINIC_OR_DEPARTMENT_OTHER): Payer: Self-pay

## 2018-04-12 ENCOUNTER — Encounter (HOSPITAL_BASED_OUTPATIENT_CLINIC_OR_DEPARTMENT_OTHER): Payer: Self-pay | Admitting: Family Medicine

## 2018-04-12 DIAGNOSIS — M1612 Unilateral primary osteoarthritis, left hip: Secondary | ICD-10-CM

## 2018-04-12 DIAGNOSIS — M179 Osteoarthritis of knee, unspecified: Secondary | ICD-10-CM

## 2018-04-12 DIAGNOSIS — M1710 Unilateral primary osteoarthritis, unspecified knee: Principal | ICD-10-CM

## 2018-04-12 DIAGNOSIS — R7309 Other abnormal glucose: Secondary | ICD-10-CM

## 2018-04-12 NOTE — Nursing Note (Signed)
Fax sent to [facility] [phone]) with Referral for PT. Confirmation received April 12, 2018 @ 10:49 am.  Cindee Salt, LPN

## 2018-04-15 ENCOUNTER — Ambulatory Visit (HOSPITAL_BASED_OUTPATIENT_CLINIC_OR_DEPARTMENT_OTHER): Payer: 59 | Admitting: Student in an Organized Health Care Education/Training Program

## 2018-04-15 ENCOUNTER — Ambulatory Visit (HOSPITAL_COMMUNITY): Payer: 59 | Admitting: Student in an Organized Health Care Education/Training Program

## 2018-04-15 ENCOUNTER — Other Ambulatory Visit: Payer: Self-pay

## 2018-04-15 ENCOUNTER — Ambulatory Visit (HOSPITAL_BASED_OUTPATIENT_CLINIC_OR_DEPARTMENT_OTHER): Payer: 59 | Admitting: Gastroenterology

## 2018-04-15 ENCOUNTER — Other Ambulatory Visit (HOSPITAL_COMMUNITY): Payer: Self-pay | Admitting: Gastroenterology

## 2018-04-15 ENCOUNTER — Encounter (HOSPITAL_COMMUNITY): Payer: Self-pay

## 2018-04-15 ENCOUNTER — Encounter (HOSPITAL_COMMUNITY)
Admission: RE | Disposition: A | Discharge status: Home / Self Care | Payer: Self-pay | Source: Ambulatory Visit | Attending: Gastroenterology

## 2018-04-15 ENCOUNTER — Inpatient Hospital Stay
Admission: RE | Admit: 2018-04-15 | Discharge: 2018-04-15 | Disposition: A | Discharge status: Home / Self Care | Payer: 59 | Source: Ambulatory Visit | Attending: Gastroenterology | Admitting: Gastroenterology

## 2018-04-15 DIAGNOSIS — Z1211 Encounter for screening for malignant neoplasm of colon: Secondary | ICD-10-CM

## 2018-04-15 DIAGNOSIS — Z87891 Personal history of nicotine dependence: Secondary | ICD-10-CM | POA: Insufficient documentation

## 2018-04-15 DIAGNOSIS — K5730 Diverticulosis of large intestine without perforation or abscess without bleeding: Secondary | ICD-10-CM

## 2018-04-15 DIAGNOSIS — D123 Benign neoplasm of transverse colon: Secondary | ICD-10-CM

## 2018-04-15 DIAGNOSIS — Z86718 Personal history of other venous thrombosis and embolism: Secondary | ICD-10-CM | POA: Insufficient documentation

## 2018-04-15 DIAGNOSIS — K635 Polyp of colon: Secondary | ICD-10-CM

## 2018-04-15 DIAGNOSIS — Z9989 Dependence on other enabling machines and devices: Secondary | ICD-10-CM | POA: Insufficient documentation

## 2018-04-15 DIAGNOSIS — K589 Irritable bowel syndrome without diarrhea: Secondary | ICD-10-CM | POA: Insufficient documentation

## 2018-04-15 DIAGNOSIS — Z79891 Long term (current) use of opiate analgesic: Secondary | ICD-10-CM | POA: Insufficient documentation

## 2018-04-15 DIAGNOSIS — G4733 Obstructive sleep apnea (adult) (pediatric): Secondary | ICD-10-CM | POA: Insufficient documentation

## 2018-04-15 DIAGNOSIS — Z96641 Presence of right artificial hip joint: Secondary | ICD-10-CM | POA: Insufficient documentation

## 2018-04-15 DIAGNOSIS — I10 Essential (primary) hypertension: Secondary | ICD-10-CM | POA: Insufficient documentation

## 2018-04-15 DIAGNOSIS — K219 Gastro-esophageal reflux disease without esophagitis: Secondary | ICD-10-CM | POA: Insufficient documentation

## 2018-04-15 DIAGNOSIS — Z79899 Other long term (current) drug therapy: Secondary | ICD-10-CM | POA: Insufficient documentation

## 2018-04-15 DIAGNOSIS — E7800 Pure hypercholesterolemia, unspecified: Secondary | ICD-10-CM | POA: Insufficient documentation

## 2018-04-15 DIAGNOSIS — E785 Hyperlipidemia, unspecified: Secondary | ICD-10-CM | POA: Insufficient documentation

## 2018-04-15 DIAGNOSIS — Z8546 Personal history of malignant neoplasm of prostate: Secondary | ICD-10-CM | POA: Insufficient documentation

## 2018-04-15 DIAGNOSIS — Z791 Long term (current) use of non-steroidal anti-inflammatories (NSAID): Secondary | ICD-10-CM | POA: Insufficient documentation

## 2018-04-15 SURGERY — COLONOSCOPY
Anesthesia: Monitor Anesthesia Care | Site: Anus | Wound class: Clean Contaminated Wounds-The respiratory, GI, Genital, or urinary

## 2018-04-15 MED ORDER — PROPOFOL 10 MG/ML INTRAVENOUS EMULSION
INTRAVENOUS | Status: DC | PRN
Start: 2018-04-15 — End: 2018-04-15
  Administered 2018-04-15: 0 ug/kg/min via INTRAVENOUS
  Administered 2018-04-15: 200 ug/kg/min via INTRAVENOUS
  Administered 2018-04-15: 300 ug/kg/min via INTRAVENOUS

## 2018-04-15 MED ORDER — SODIUM CHLORIDE 0.9 % (FLUSH) INJECTION SYRINGE
2.0000 mL | INJECTION | INTRAMUSCULAR | Status: DC | PRN
Start: 2018-04-15 — End: 2018-04-15

## 2018-04-15 MED ORDER — LACTATED RINGERS INTRAVENOUS SOLUTION
INTRAVENOUS | Status: DC
Start: 2018-04-15 — End: 2018-04-15

## 2018-04-15 MED ORDER — SODIUM CHLORIDE 0.9 % (FLUSH) INJECTION SYRINGE
2.0000 mL | INJECTION | Freq: Three times a day (TID) | INTRAMUSCULAR | Status: DC
Start: 2018-04-15 — End: 2018-04-15

## 2018-04-15 MED ADMIN — ketorolac 15 mg/mL injection solution: INTRAVENOUS | @ 10:00:00

## 2018-04-15 SURGICAL SUPPLY — 84 items
ATTACHMENT ENDOSCP 4MM 13.4MM RND EDG STRL LF DISP (GENE)
ATTACHMENT ENDOSCP 4MM 13.4MM RND EDGE STRL LF  DISP (GENE) IMPLANT
ATTACHMENT ENDOSCP 4MM 15MM RND EDGE STRL LF  DISP (ENDOSCOPIC SUPPLIES) IMPLANT
CATH CRE 10-11-12MM 7.5FR 5.5CM 240CM 2.8MM 3.2MM LOW PROF GW BAL DIL ESOPH PYL BIL PEBAX STRL LF (BALLOON) IMPLANT
CATH ELHMST GLD PRB 10FR 300CM_BIPO RND DIST TIP STD CONN (DIAGNOSTIC)
CATH ELHMST GLD PROBE 10FR 300CM BIPOLAR RND DIST TIP STD CONN FIRM SHAFT HMGLD STRL DISP 3.7MM MN (DIAGNOSTIC) IMPLANT
CATH ELHMST GLD PROBE 7FR 300CM BIPOLAR RND DIST TIP STD CONN FIRM SHAFT HMGLD STRL DISP 2.8MM MN (DIAGNOSTIC) IMPLANT
CATH ELHMST GLD PROBE 7FR 300C_M BIPOLAR RND DIST TIP STD (DIAGNOSTIC)
CLIP HMST MR CONDITIONAL BRD CATH ROT CONTROL KNOB NO SHEATH RSL 360 235CM 2.8MM 11MM OPN (SURGICAL INSTRUMENTS) IMPLANT
CONV USE ITEM 343591 - SOLIDIFY FLUID 1500ML DSPNSR L_Q TX SOLIDIFY SFTP LTS+ DISP (STER) ×1 IMPLANT
DEVICE HEMOSTASIS CLIP 360 235CM RESOLUTION (INSTRUMENTS)
DEVICE HEMOSTASIS CLIP 360_235CM RESOLUTION (INSTRUMENTS)
DILATOR ENDOS CRE 240CM 5.5CM 11-13.5-15MM 7.5FR ESOPH PYL (BALLOON)
DILATOR ENDOS CRE 240CM 5.5CM 11-13.5-15MM 7.5FR ESOPH PYL BIL BAL LOW PROF GW PEBAX STRL LF  DISP (BALLOON) IMPLANT
DILATOR ENDOS CRE 240CM 5.5CM 15-16.5-18MM 7.5FR ESOPH PYL (BALLOON)
DILATOR ENDOS CRE 240CM 5.5CM 15-16.5-18MM 7.5FR ESOPH PYL BIL BAL LOW PROF GW PEBAX STRL LF  DISP (BALLOON) IMPLANT
DISCONTINUED USE ITEM 309153 - TRAP SPECI ARGYLE 40CC GRAD SC_REW ON CAP REM MALE CONN (Cautery Accessories) IMPLANT
DISCONTINUED USE ITEM 339015 - CONTAINR STRL 10% NEUT BF FRMLN POLYPROP GRAD LEAK RST ORNG PREFL SCREW CAP FSHR HLTHCR PRTCL GRN (CHEM) ×1 IMPLANT
DISCONTINUED USE ITEM 82101 - TUBING OXYGEN 50/CS 001302 (TUBE/TUBING & SUCTION SUPPLIES) IMPLANT
ELECTRODE ESURG 165CM ITKNIFE NANO STRL CERM 3.5MM DISP INSL TIP LF  ACPT 2.8MM CHNL ENDOS (ENDOSCOPIC SUPPLIES) IMPLANT
ELECTRODE ESURG 165CM TRIANGLETIP KNIFE STRL CERM 4.5MM DISP LF  ACPT 2.8MM CHNL ENDOS SUBMUCOSAL (ENDOSCOPIC SUPPLIES) IMPLANT
ELECTRODE ESURG 230CM DUALKNIFE J STRL CERM 1.5MM DISP BUIL IN HNDL FLUID INJ LF  ACPT 2.8MM CHNL (Cautery Accessories) IMPLANT
ELECTRODE ESURG 230CM ITKNIFE NANO STRL CERM 3.5MM DISP INSL TIP LF  ACPT 2.8MM CHNL ENDOS (CAUTERY SUPPLIES) IMPLANT
ELECTRODE PATIENT RTN 9FT VLAB C30- LB RM PHSV ACRL FOAM CORD NONIRRITATE NONSENSITIZE ADH STRP (CAUTERY SUPPLIES) IMPLANT
ELECTRODE PATIENT RTN 9FT VLAB REM C30- LB PLHSV ACRL FOAM (CAUTERY SUPPLIES)
FORCEPS BIOPSY 240CM 2.4MM RJ_4 2.8MM LRG CPC DISP ORNG (GUIDING)
FORCEPS BIOPSY MICROMESH TTH STREAMLINE CATH 240CM 2.4MM RJ 4 SS LRG CPC STRL DISP ORNG 2.8MM WRK (GUIDING) IMPLANT
FORCEPS BIOPSY NEEDLE 160CM 1. 8MM RJ 4 PED 2+ MM DISP (INSTRUMENTS)
FORCEPS BIOPSY NEEDLE 160CM 1.8MM RJ 4 DISP YW 2MM WRK CHNL GSPED (SURGICAL INSTRUMENTS) IMPLANT
FORCEPS BIOPSY NEEDLE 240CM 2. 2MM RJ 4 2.8MM STD CPC STRL (INSTRUMENTS)
FORCEPS BIOPSY NEEDLE 240CM 2.2MM RJ 4 2.8MM STD CPC STRL DISP ORNG (SURGICAL INSTRUMENTS) IMPLANT
FORCEPS ENDOS 240CM 2.8MM RJ 4 JMB DISP (INSTRUMENTS) ×1
FORCEPS ENDOS 240CM 2.8MM RJ 4 JMB DISP (SURGICAL INSTRUMENTS) ×1 IMPLANT
FORCEPS ENDOS HMST GRSPR MONOP OL 165CM 2.75MM COAGRASPER 2.8 (INSTRUMENTS ENDOMECHANICAL)
FORCEPS ESURG 165X5MM COAGRASPER HMST STRL LF  DISP (ENDOSCOPIC SUPPLIES) IMPLANT
FORCEPS ESURG 230X4MM COAGRASPER HMST STRL LF  DISP (ENDOSCOPIC SUPPLIES) IMPLANT
FORMALIN 10% BUFF 8ML_23032059 100EA/PK (CHEM) ×1
GOWN SURG XL AAMI L3 NONREINFO RCE HKLP CLSR STRL LTX PNK SMS (DGOW)
GOWN SURG XL L3 NONREINFORCE HKLP CLSR STRL LTX PNK SMS 47IN (DGOW) IMPLANT
JELLY LUB PDI BCTRST H2O SOL N ONSTAIN NGRS STRL GLYC MTHY (SURGICAL INSTRUMENTS) ×2 IMPLANT
KIT ENDOS ENDOZIME BDSD SLR SPONGE ENZM DETERGENT 500ML (DIS) ×1 IMPLANT
KIT ENDOS ENZM BDSD SLR SPONGE_ENZM DTRG 500ML (DIS) ×1
KNIFE ENDOS 165CM 1.7MM .9MM I TKNIFE NANO CERM SM INSL TIP (INSTRUMENTS ENDOMECHANICAL)
KNIFE ESURG 165CM 2.8MM 3ANG T_IP 4.5MM STRL DISP (INSTRUMENTS ENDOMECHANICAL)
KNIFE ESURG 2300MM 2.8MM DUALK_NIFEJ BUIL IN HNDL FLUID INJ (Cautery Accessories)
KNIFE ESURG 230CM 1.7MM .9MM I_TKNIFE NANO SHORT LGTH CERM (CAUTERY SUPPLIES)
LIGATOR 2300MM 30MM PLLP PRELD_DEV INT HNDL DISP ENDOS (INSTRUMENTS ENDOMECHANICAL)
LIGATOR DEV STRL DISP ENDOS LF (ENDOSCOPIC SUPPLIES) IMPLANT
LINER SUCT RD CRD MEDIVAC TW L OCK LID SHTOF VALVE CAN FLTR (Suction) ×1
LINER SUCT RD CRD MEDIVAC TW LOCK LID SHTOF VALVE CAN FILTER 1500CC LF  DISP (Suction) ×1 IMPLANT
LOOP AUTOCLAV HLDR DTCH 30MM S_URG NYL PLPCTM NONST DISP (INSTRUMENTS ENDOMECHANICAL)
LOOP HLDR DTCH AUTOCLAV 30MM SURG NYL PLPCTM NONST LF  DISP (ENDOSCOPIC SUPPLIES) IMPLANT
MARKER ENDOS SPOT EX PERM IND DRK SYRG 5ML (GENE) IMPLANT
MARKER ENDOS SPOT EX PREFL PRE_ASSEMBLE SYRG PERM (GENE)
NEEDLE ENDOS 5MM 25GA 2.5MM SS TFLN 230CM INJ PRJ SHEATH LL SPRG LD HNDL CRLK STRL LF  DISP (NEEDLES & SYRINGE SUPPLIES) IMPLANT
NEEDLE ENDOS 5MM 25GA 2.5MM SS_TFLN 230CM INJ PRJ SHTH LL (NEEDLES & SYRINGE SUPPLIES)
NEEDLE SCLRTX 25GA 2.3MM OPTC TIP SHTH STRL LF DISP YW (NEEDLES & SYRINGE SUPPLIES) IMPLANT
NET SPEC RETR 160CM 3MM RTHNT MAXI SHEATH 8X4CM NONST LF  DISP (Dilators) IMPLANT
NET SPEC RETR 160MM 1.8MM RTHN_T MINI 4.5X2CM PED NONST LF (Dilators)
PROBE COAG 7.2FT 6.9FR FIAPC CRCMF PLUG PLAY FUNCTIONALITY FILTER (ENDOSCOPIC SUPPLIES) IMPLANT
PROBE ESURG 220CM 2.3MM FIAPC FLXB STR FIRE ARGON PLAS COAG (CAUTERY SUPPLIES)
PROBE ESURG 220CM 2.3MM FIAPC FLXB STR FIRE STRL DISP (CAUTERY SUPPLIES) IMPLANT
PROBE ESURG 220CM 2.3MM FIAPC_FLXB STR FIRE ARGON PLAS COAG (CAUTERY SUPPLIES)
RETRIEVER 230CM 2.5MM ALT WV N T ENDOS NONST RTHNT 6X3CM (INSTRUMENTS ENDOMECHANICAL)
RETRIEVER ENDOS 160CM 1.8MM RTHNT MINI SM CATH SHEATH 4.5X2CM NONST (Dilators) IMPLANT
RETRIEVER ENDOS 230CM 2.5MM RTHNT ALT WV 6X3CM (ENDOSCOPIC SUPPLIES) IMPLANT
SNARE 9MM 230CM 2.4MM EXACTO C OLD BRD WRE CLEAN CUT ENDOS (INSTRUMENTS ENDOMECHANICAL)
SNARE 9MM 230CM 2.4MM EXACTO COLD BRD WRE CLEAN CUT ENDOS PLC RESCT STRL LF  DISP (ENDOSCOPIC SUPPLIES) IMPLANT
SNARE MED OVAL 240CM 2.4MM SNS LOOP SHRTHRW FLXB ENDOS 2.8MM WRK CHNL PLPCTM 27MM STRL DISP (ENDOSCOPIC SUPPLIES) IMPLANT
SNARE MED OVAL 240CM 2.4MM SNS_LOOP SHRTHRW FLXB ENDOS 2.8MM (INSTRUMENTS ENDOMECHANICAL)
SNARE SM OVAL 240CM 2.4MM SNS LOOP SHRTHRW FLXB ENDOS PLPCTM 13MM STRL LF  DISP (DIAGNOSTIC) IMPLANT
SNARE SM OVL 240CM 2.4MM SNS L_OOP SHRTHRW FLXB ENDOS PLPCTM (DIAGNOSTIC)
SOLIDIFY FLUID 1500ML DSPNSR L_Q TX SOLIDIFY SFTP LTS+ DISP (STER) ×2
SOLUTION IRRG H2O 500CC 2F7113 (SOLUTIONS) ×1
SYRINGE INFLAT ALN II GA STRL DISP 60ML (NEEDLES & SYRINGE SUPPLIES) IMPLANT
SYRINGE INFLAT ALN II GA STRL_DISP 60ML (NEEDLES & SYRINGE SUPPLIES)
SYRINGE LL 50ML LF  STRL GRAD N-PYRG DEHP-FR PVC FREE MED DISP CLR (NEEDLES & SYRINGE SUPPLIES) IMPLANT
SYRINGE LL 50ML LF STRL GRAD_N-PYRG DEHP-FR PVC FREE MED (NEEDLES & SYRINGE SUPPLIES)
TRAP SPEC RETR ETRAP MAGNIFY W IND MSR GUIDE PLYP LF DISP (GI LAB SUPPLIES) IMPLANT
TUBING OXYGEN 50/CS 001302 (TUBE/TUBING & SUCTION SUPPLIES)
TUBING SUCT CLR 20FT 9/32IN MEDIVAC NCDTV M/M CONN STRL LF (Suction) ×1 IMPLANT
TUBING SUCT CONN 20FT LONG STRL N720A (Suction) ×1
USE ITEM 43607 SNARE MED OVAL 240CM 2.4MM SNS LOOP SHRTHRW FLXB ENDOS 2.8MM WRK CHNL PLPCTM 27MM STRL DISP (ENDOSCOPIC SUPPLIES) IMPLANT
WATER STRL 500ML PLASTIC PR BTL LF (SOLUTIONS) ×1 IMPLANT

## 2018-04-15 NOTE — Anesthesia Preprocedure Evaluation (Signed)
ANESTHESIA PRE-OP EVALUATION  Planned Procedure: COLONOSCOPY (N/A Anus)  Review of Systems     Physical Assessment      Airway       Mallampati: II    TM distance: >3 FB    Neck ROM: full  Mouth Opening: good.            Dental                    Pulmonary    Breath sounds clear to auscultation       Cardiovascular    Rhythm: regular  Rate: Normal       Other findings            Plan  Planned anesthesia type: MAC        ASA 3     Intravenous induction     Anesthetic plan and risks discussed with patient.     Anesthesia issues/risks discussed are: Dental Injuries, Post-op Pain Management, Stroke, Nerve Injuries, Intraoperative Awareness/ Recall, Sore Throat, Eye /Visual Loss, Aspiration, PONV, Cardiac Events/MI, Blood Loss and Post-op Cognitive Dysfunction.    Use of blood products discussed with patient who.     Patient's NPO status is appropriate for Anesthesia.           Plan discussed with CRNA and attending.                     NPO approp  Reflux controlled  R/b explained, AQA

## 2018-04-15 NOTE — H&P (Signed)
[facility]   GI Admission History and Physical      Demetrick, Eichenberger   MRN:  [id number]  Date of Birth:  May 01, 1955    Date of Procedure:  04/15/2018    Chief Complaint: Screening    HPI: Henry [name], Henry [name] is a 63 y.o. year old male who presents today for Colonoscopy.   This is the patient's 2nd colonoscopy.    This procedure is being done to evaluate Screening.    The patient denies abdominal pain, hematochezia or unintentional weight loss.    Past Medical History:   Diagnosis Date   . Cancer (CMS [facility]) 11/2015    prostate cancer   . Chronic pain    . CPAP (continuous positive airway pressure) dependence     settings unknown   . Deep vein thrombosis (DVT) (CMS [facility])     left leg   . GERD (gastroesophageal reflux disease)     well controlled on omeprazole   . Heartburn    . HTN    . Hyperlipidemia    . Hypertension    . Irritable bowel syndrome    . Neck problem    . Osteoarthritis of back    . Prostate cancer (CMS [place]) 12/11/2015   . Sleep apnea    . Wears glasses            Allergies   Allergen Reactions   . Sulfa (Sulfonamides) Rash   . Shellfish Containing Products Itching and Swelling     Shrimp - eye swelling, itching       Medications Prior to Admission     Prescriptions    atorvastatin (LIPITOR) 40 mg Oral Tablet    Take 1 Tab (40 mg total) by mouth Every evening    diclofenac sodium (VOLTAREN) 75 mg Oral Tablet, Delayed Release (E.C.)    Take 1 Tab (75 mg total) by mouth Twice daily    dicyclomine (BENTYL) 20 mg Oral Tablet    Take 1 Tab (20 mg total) by mouth Four times a day    Ferrous Sulfate (SLOW RELEASE IRON) 142 mg (45 mg iron) Oral Tablet Sustained Release    Take 1 Tab (142 mg total) by mouth Three times a day as needed    Patient taking differently:  Take 1 Tab by mouth Once a day     GLUCOS CHOND CPLX ADVANCED ORAL    Take 2 Tabs by mouth Once a day Glucosamine-Chondroitin 1500 mg    metoprolol tartrate (LOPRESSOR) 50 mg Oral Tablet    Take 1 Tab (50 mg total) by mouth  Twice daily    metroNIDAZOLE (METROGEL) 1 % Gel with Pump    by Apply externally route Once a day    multivitamin Oral Tablet    Take 1 Tab by mouth Once a day    Non-Formulary/Special Preparation (MEDICATION HELP)    CBD Oil 13-15 drops daily    Omeprazole 20 mg Oral Tablet, Delayed Release (E.C.)    Take by mouth Monday Wednesday Friday    traMADol (ULTRAM) 50 mg Oral Tablet    Take 1 Tab (50 mg total) by mouth Every 6 hours as needed for Pain    Patient not taking:  Reported on 12/21/2017           Past Surgical History:   Procedure Laterality Date   . HX ANKLE FRACTURE TX Right    . HX APPENDECTOMY     . HX HIP REPLACEMENT Right  05/22/13    Per Dr. Caryl Comes   . PB COLONOSCOPY,DIAGNOSTIC             Physical Exam:  General: NAD, appears well  HEENT: EOMI, Airway patent, Trachea midline, No large goiters or neck lymphadenopathy  Cardio/Pulmonary: No cyanosis, edema. Normal pulses. Good capillary refill  Abdomen: Soft, nondistended, nontender    Assessment:  Screening    Plan:  Proceed with Colonoscopy. The patients bowel preparation consisted of  GoLytely.     Orders Placed This Encounter   . INSERT & MAINTAIN PERIPHERAL IV ACCESS   . [place]   . [place]   . INSERT & MAINTAIN PERIPHERAL IV ACCESS   . AND Linked Order Group    . NS flush syringe    . NS flush syringe   . LR premix infusion         Sharyn Creamer, MD, [facility]   04/15/2018, 08:45  Associate Professor of Medicine  Gastroenterology and Hepatology  [facility] / [place]

## 2018-04-15 NOTE — Anesthesia Postprocedure Evaluation (Signed)
Anesthesia Post Op Evaluation    Patient: Henry [name] [facility]  Procedure(s):  COLONOSCOPY  COLONOSCOPY WITH BIOPSY    Last Vitals:Temperature: 36.4 C (97.5 F) (04/15/18 0951)  Heart Rate: 66 (04/15/18 0951)  BP (Non-Invasive): (!) 142/76 (04/15/18 0951)  Respiratory Rate: 19 (04/15/18 0951)  SpO2: 98 % (04/15/18 0951)    Patient is sufficiently recovered from the effects of anesthesia to participate in the evaluation and has returned to their pre-procedure level.  Patient location during evaluation: PACU   Post-procedure handoff checklist completed    Patient participation: complete - patient participated  Level of consciousness: awake and alert and responsive to verbal stimuli  Pain management: adequate  Airway patency: patent  Anesthetic complications: no  Cardiovascular status: acceptable  Respiratory status: acceptable  Hydration status: acceptable  Patient post-procedure temperature: Pt Normothermic   PONV Status: Absent

## 2018-04-15 NOTE — Anesthesia Transfer of Care (Signed)
ANESTHESIA TRANSFER OF CARE   Rodarius Kichline is a 63 y.o. ,male, Weight: 135.5 kg (298 lb 11.6 oz)   had Procedure(s):  COLONOSCOPY  COLONOSCOPY WITH BIOPSY  performed  04/15/18   Primary Service: Sharyn Creamer, MD    Past Medical History:   Diagnosis Date   . Cancer (CMS [facility]) 11/2015    prostate cancer   . Chronic pain    . CPAP (continuous positive airway pressure) dependence     settings unknown   . Deep vein thrombosis (DVT) (CMS [facility])     left leg   . GERD (gastroesophageal reflux disease)     well controlled on omeprazole   . Heartburn    . HTN    . Hyperlipidemia    . Hypertension    . Irritable bowel syndrome    . Neck problem    . Osteoarthritis of back    . Prostate cancer (CMS [place]) 12/11/2015   . Sleep apnea    . Wears glasses       Allergy History as of 04/15/18     SULFA (SULFONAMIDES)       Noted Status Severity Type Reaction    10/25/07 Elmyra Ricks, RN 10/25/07 Active   Rash          SHELLFISH CONTAINING PRODUCTS       Noted Status Severity Type Reaction    05/16/13 1057 Janann Colonel 04/04/13 Active Low  Itching, Swelling    Comments:  Shrimp - eye swelling, itching     04/04/13 1402 Gerri Lins, MA 04/04/13 Active       Comments:  shrimp               I completed my transfer of care / handoff to the receiving personnel during which we discussed:  Access, Airway, All key/critical aspects of case discussed, Analgesia, Antibiotics, Expectation of post procedure, Fluids/Product and Gave opportunity for questions and acknowledgement of understanding                                              Additional Info:Tiffany verb comfort with pt condition. IV clear of propofol                      Last OR Temp: Temperature: 36.4 C (97.5 F)  ABG:  POTASSIUM   Date Value Ref Range Status   10/07/2017 4.4 3.5 - 5.1 mmol/L Final   05/23/2013 4.5 3.5 - 5.1 mmol/L Final     CALCIUM   Date Value Ref Range Status   10/07/2017 9.3 8.5 - 10.2 mg/dL Final   05/23/2013 8.6 8.5 - 10.4 mg/dL Final          Airway:* No LDAs found *  Blood pressure (!) 142/76, pulse 66, temperature 36.4 C (97.5 F), resp. rate 19, height 1.753 m (5' 9" ), weight 135.5 kg (298 lb 11.6 oz), SpO2 98 %.

## 2018-04-19 LAB — HISTORICAL SURGICAL PATHOLOGY SPECIMEN

## 2018-04-20 ENCOUNTER — Encounter (INDEPENDENT_AMBULATORY_CARE_PROVIDER_SITE_OTHER): Payer: Self-pay | Admitting: Gastroenterology

## 2018-05-10 ENCOUNTER — Encounter (HOSPITAL_COMMUNITY): Payer: Self-pay | Admitting: Student in an Organized Health Care Education/Training Program

## 2018-05-24 ENCOUNTER — Ambulatory Visit: Payer: Self-pay | Admitting: Physician Assistant

## 2018-05-27 ENCOUNTER — Ambulatory Visit: Payer: Self-pay | Admitting: Physician Assistant

## 2018-05-31 ENCOUNTER — Ambulatory Visit (HOSPITAL_COMMUNITY): Payer: 59 | Admitting: Radiation Oncology

## 2018-06-03 ENCOUNTER — Ambulatory Visit (HOSPITAL_COMMUNITY): Payer: 59 | Admitting: Radiation Oncology

## 2018-06-03 ENCOUNTER — Encounter (HOSPITAL_BASED_OUTPATIENT_CLINIC_OR_DEPARTMENT_OTHER): Payer: Medicare Other | Attending: Internal Medicine

## 2018-06-03 ENCOUNTER — Other Ambulatory Visit: Payer: Self-pay

## 2018-06-03 ENCOUNTER — Other Ambulatory Visit (HOSPITAL_COMMUNITY)
Admission: RE | Admit: 2018-06-03 | Discharge: 2018-06-03 | Disposition: A | Discharge status: Home / Self Care | Payer: Medicare Other | Source: Other Acute Inpatient Hospital | Attending: Internal Medicine | Admitting: Internal Medicine

## 2018-06-03 DIAGNOSIS — L89214 Pressure ulcer of right hip, stage 4: Secondary | ICD-10-CM | POA: Insufficient documentation

## 2018-06-03 DIAGNOSIS — B9562 Methicillin resistant Staphylococcus aureus infection as the cause of diseases classified elsewhere: Secondary | ICD-10-CM | POA: Diagnosis not present

## 2018-06-03 DIAGNOSIS — Z87891 Personal history of nicotine dependence: Secondary | ICD-10-CM | POA: Insufficient documentation

## 2018-06-06 LAB — AEROBIC CULTURE W GRAM STAIN (SUPERFICIAL SPECIMEN)
Culture: NORMAL
Gram Stain: NONE SEEN

## 2018-06-06 LAB — AEROBIC CULTURE? (SUPERFICIAL SPECIMEN)

## 2018-06-08 ENCOUNTER — Other Ambulatory Visit (HOSPITAL_COMMUNITY): Payer: Self-pay | Admitting: Radiation Oncology

## 2018-06-08 ENCOUNTER — Encounter (HOSPITAL_COMMUNITY): Payer: Self-pay | Admitting: Radiation Oncology

## 2018-06-08 DIAGNOSIS — Z006 Encounter for examination for normal comparison and control in clinical research program: Secondary | ICD-10-CM

## 2018-06-08 DIAGNOSIS — C61 Malignant neoplasm of prostate: Secondary | ICD-10-CM

## 2018-06-08 NOTE — Progress Notes (Signed)
RTOG 0924-Pt was scheduled for 24 mo follow up, but appt was cancelled due to COVID-19 restrictions.  Telephone call was made to the patient in lieu of being able to see the patient in clinic.  Pt would like to come in to the laboratory to have PSA drawn since we will not be seeing him in clinic.  Order was placed for PSA.  DRE will not be performed as pt is not in office.  Deviation will be recorded.    AEs reviewed with patient:    Lt. Hip pain gr 1-This is ongoing.  Hip replacement is being considered.    The patient denies dysuria, hematuria, worsening nocturia or urinary frequency, chest pain, shortness of breath, abdominal pain, diarrhea, constipation or any new problems.  The patient would like to continue on trial.  Pt will be in Friday for PSA and will be notified of results.  Next appt is to be in 6 months.  This appt will be scheduled.  Pt will look for appt in his MyChart.  Lorn Junes, RN    Tomi Likens, DO, MBA, MS  Assistant Professor, [place]

## 2018-06-09 ENCOUNTER — Other Ambulatory Visit: Payer: Self-pay | Admitting: General Practice

## 2018-06-09 ENCOUNTER — Other Ambulatory Visit: Payer: Self-pay | Admitting: Internal Medicine

## 2018-06-09 DIAGNOSIS — L89214 Pressure ulcer of right hip, stage 4: Secondary | ICD-10-CM | POA: Diagnosis not present

## 2018-06-10 ENCOUNTER — Other Ambulatory Visit: Payer: Self-pay

## 2018-06-10 ENCOUNTER — Ambulatory Visit
Admission: RE | Admit: 2018-06-10 | Discharge: 2018-06-10 | Disposition: A | Discharge status: Home / Self Care | Payer: 59 | Source: Ambulatory Visit | Attending: Radiation Oncology | Admitting: Radiation Oncology

## 2018-06-10 DIAGNOSIS — C61 Malignant neoplasm of prostate: Secondary | ICD-10-CM | POA: Insufficient documentation

## 2018-06-10 DIAGNOSIS — Z006 Encounter for examination for normal comparison and control in clinical research program: Secondary | ICD-10-CM | POA: Insufficient documentation

## 2018-06-10 LAB — PSA, DIAGNOSTIC: PSA: 0.3 ng/mL (ref <=4.0)

## 2018-06-10 NOTE — Nurses Notes (Signed)
0940 Labs drawn using straight stick. 2x2 gauze and paper tape placed over site. Patient left in ambulatory and good condition. Hermine Messick RN

## 2018-06-13 ENCOUNTER — Other Ambulatory Visit: Payer: Self-pay | Admitting: Internal Medicine

## 2018-06-13 ENCOUNTER — Other Ambulatory Visit (HOSPITAL_COMMUNITY): Payer: Self-pay | Admitting: Internal Medicine

## 2018-06-13 DIAGNOSIS — A4902 Methicillin resistant Staphylococcus aureus infection, unspecified site: Secondary | ICD-10-CM

## 2018-06-13 DIAGNOSIS — L89214 Pressure ulcer of right hip, stage 4: Secondary | ICD-10-CM

## 2018-06-14 ENCOUNTER — Encounter (HOSPITAL_BASED_OUTPATIENT_CLINIC_OR_DEPARTMENT_OTHER): Payer: Self-pay

## 2018-06-21 ENCOUNTER — Encounter (HOSPITAL_BASED_OUTPATIENT_CLINIC_OR_DEPARTMENT_OTHER): Payer: Self-pay | Admitting: Family Medicine

## 2018-06-23 ENCOUNTER — Encounter (HOSPITAL_BASED_OUTPATIENT_CLINIC_OR_DEPARTMENT_OTHER): Payer: Medicare Other | Attending: Internal Medicine

## 2018-06-23 DIAGNOSIS — L89214 Pressure ulcer of right hip, stage 4: Secondary | ICD-10-CM | POA: Diagnosis not present

## 2018-06-24 ENCOUNTER — Other Ambulatory Visit (HOSPITAL_BASED_OUTPATIENT_CLINIC_OR_DEPARTMENT_OTHER): Payer: Self-pay | Admitting: Family Medicine

## 2018-06-24 DIAGNOSIS — M1990 Unspecified osteoarthritis, unspecified site: Secondary | ICD-10-CM

## 2018-06-30 DIAGNOSIS — L89214 Pressure ulcer of right hip, stage 4: Secondary | ICD-10-CM | POA: Diagnosis not present

## 2018-07-01 ENCOUNTER — Ambulatory Visit (HOSPITAL_COMMUNITY)
Admission: RE | Admit: 2018-07-01 | Discharge: 2018-07-01 | Disposition: A | Discharge status: Home / Self Care | Payer: Medicare Other | Source: Ambulatory Visit | Attending: Internal Medicine | Admitting: Internal Medicine

## 2018-07-01 NOTE — Progress Notes (Signed)
Pt no show for MRI appt.

## 2018-07-14 DIAGNOSIS — L89214 Pressure ulcer of right hip, stage 4: Secondary | ICD-10-CM | POA: Diagnosis not present

## 2018-07-19 ENCOUNTER — Encounter (HOSPITAL_BASED_OUTPATIENT_CLINIC_OR_DEPARTMENT_OTHER): Payer: Medicare Other | Attending: Internal Medicine

## 2018-07-19 DIAGNOSIS — L89214 Pressure ulcer of right hip, stage 4: Secondary | ICD-10-CM | POA: Insufficient documentation

## 2018-07-19 DIAGNOSIS — Z8614 Personal history of Methicillin resistant Staphylococcus aureus infection: Secondary | ICD-10-CM | POA: Diagnosis not present

## 2018-07-21 DIAGNOSIS — L89214 Pressure ulcer of right hip, stage 4: Secondary | ICD-10-CM | POA: Diagnosis not present

## 2018-07-28 DIAGNOSIS — L89214 Pressure ulcer of right hip, stage 4: Secondary | ICD-10-CM | POA: Diagnosis not present

## 2018-08-04 DIAGNOSIS — L89214 Pressure ulcer of right hip, stage 4: Secondary | ICD-10-CM | POA: Diagnosis not present

## 2018-08-04 IMAGING — CT CT HEAD W/O CM
3 series · 15 of 47 positions shown, 18 images · non-contrast
Comparison: CT head dated August 10, 2009.

CLINICAL DATA: Seizure and altered mental status.

EXAM:
CT HEAD WITHOUT CONTRAST
TECHNIQUE: Contiguous axial images were obtained from the base of the skull
through the vertex without intravenous contrast.

[Series 2: head wo · axial · 0.47mm/px · z∈[-146,-16]mm · 9 of 32 slices shown, 12 images]
[im 3/32  brain]
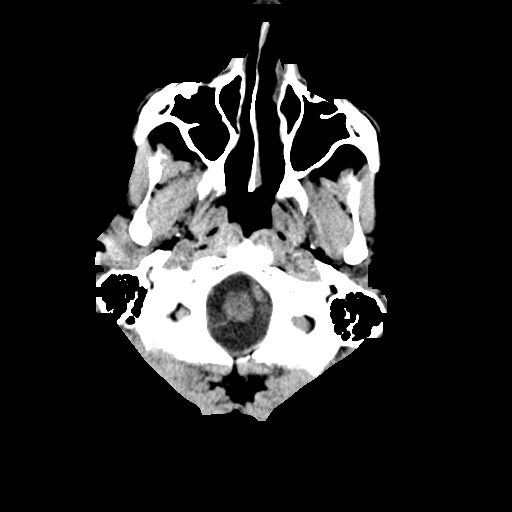
[im 3/32  bone]
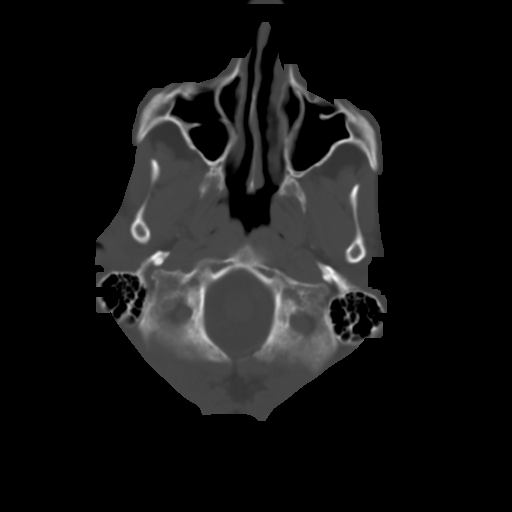
[im 6/32  brain]
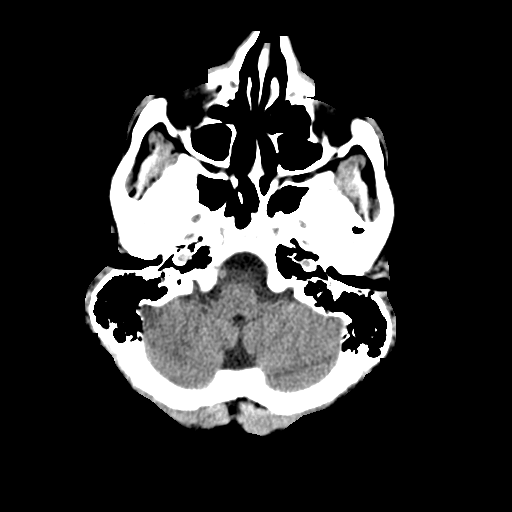
[im 9/32  brain]
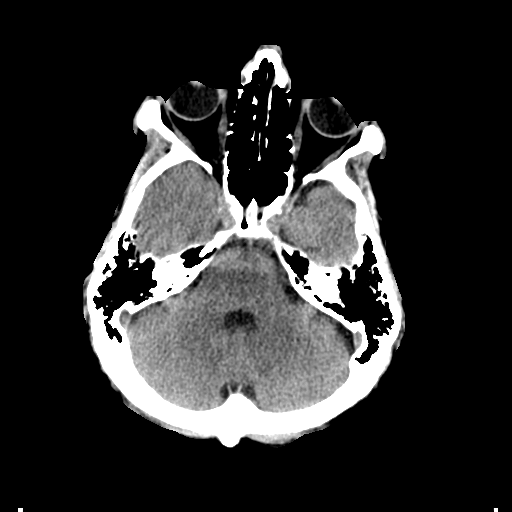
[im 12/32  brain]
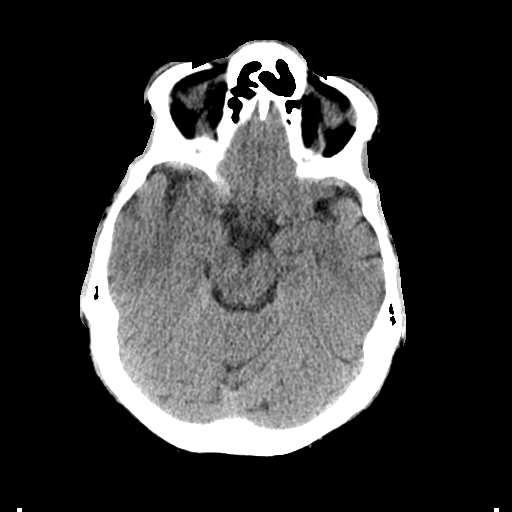
[im 17/32  brain]
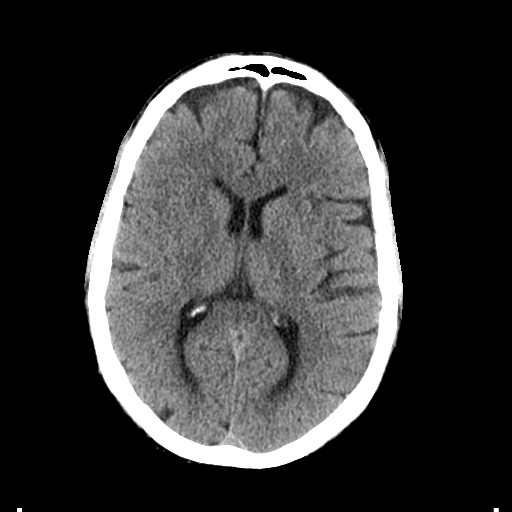
[im 17/32  bone]
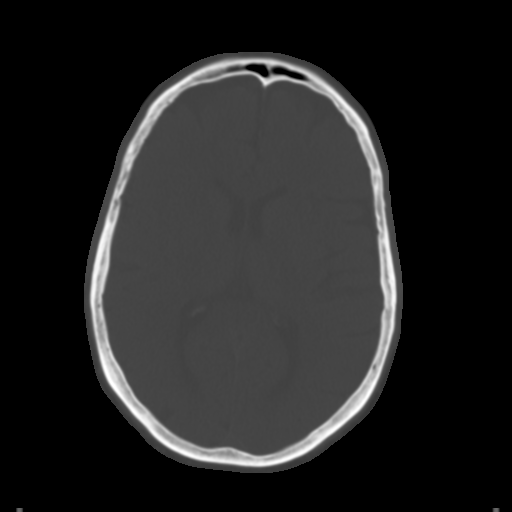
[im 20/32  brain]
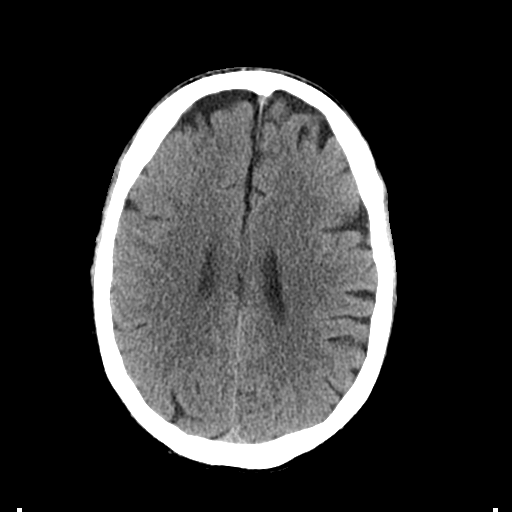
[im 23/32  brain]
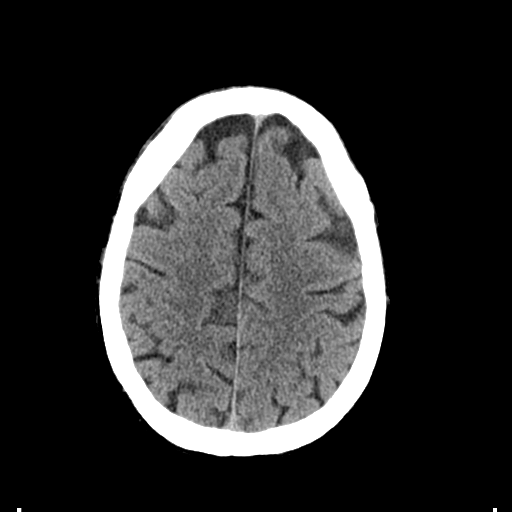
[im 26/32  brain]
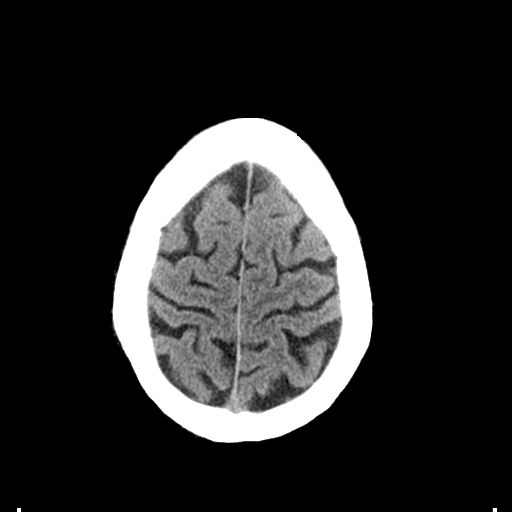
[im 29/32  brain]
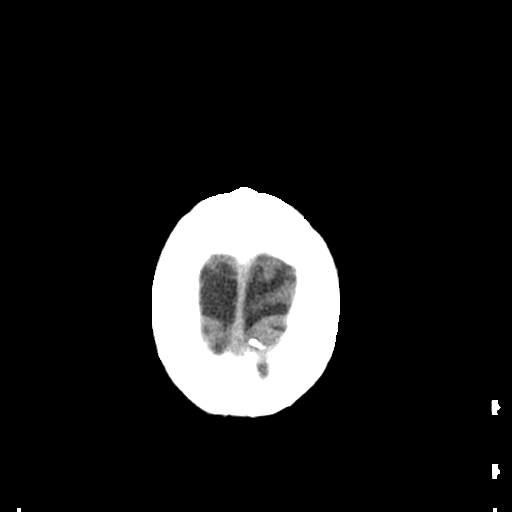
[im 29/32  bone]
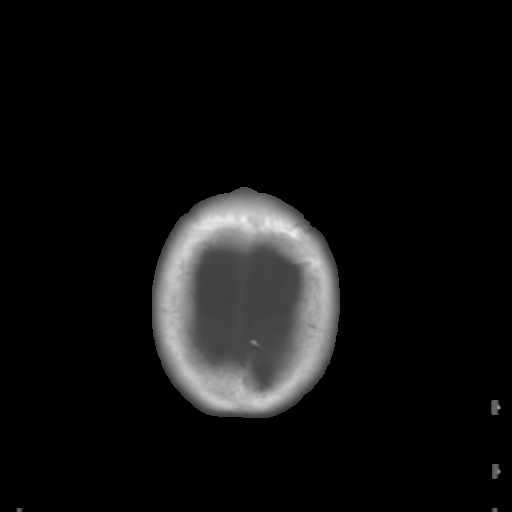

[Series 5: coronal soft tissue · coronal · 0.32mm/px · 3 of 79 slices shown]
[im 27/79  brain]
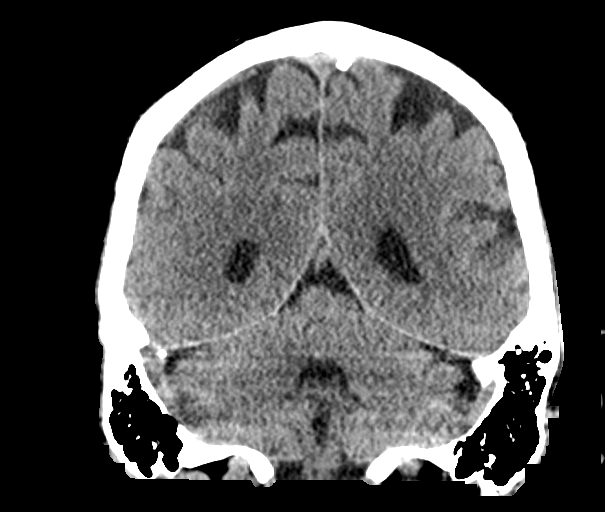
[im 35/79  brain]
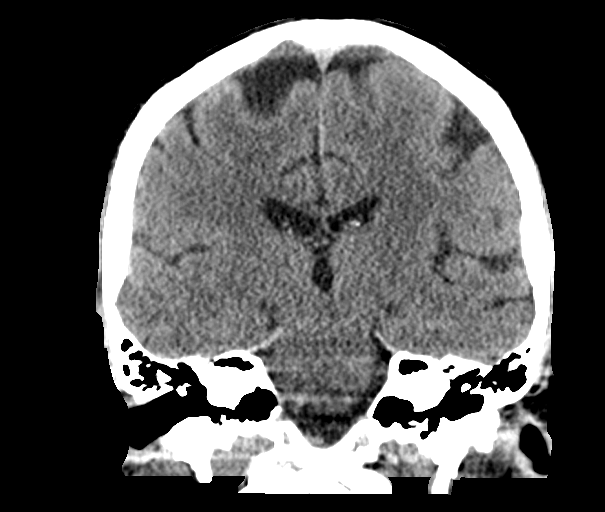
[im 44/79  brain]
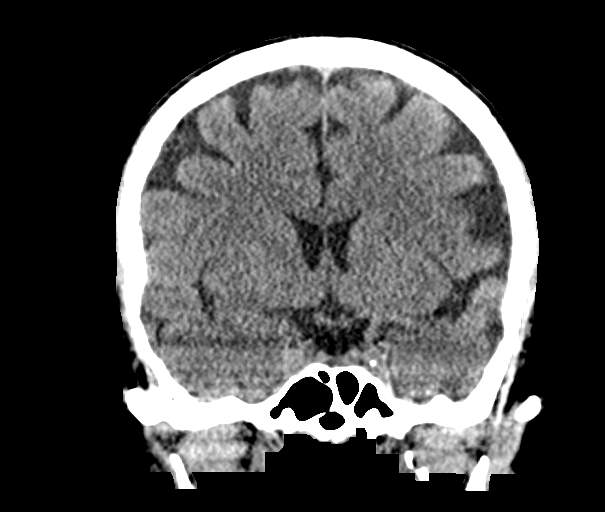

[Series 6: sagittal soft tissue · sagittal · 0.32mm/px · 3 of 64 slices shown]
[im 22/64  brain]
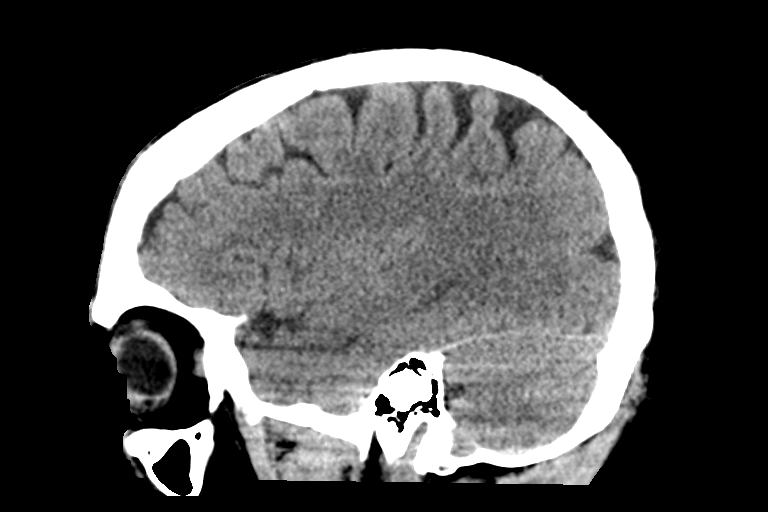
[im 32/64  brain]
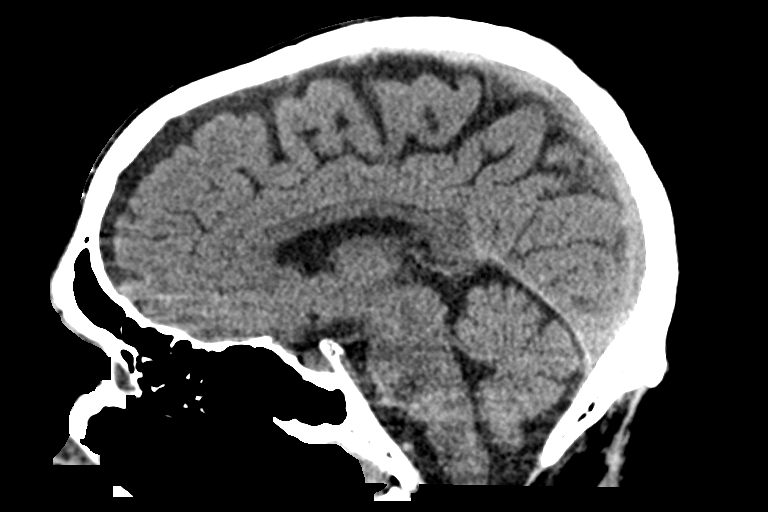
[im 43/64  brain]
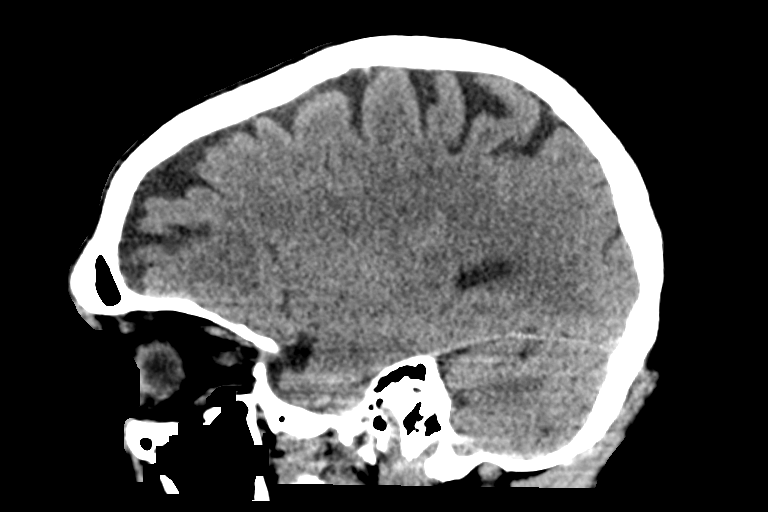

[15 of 47 positions shown; findings below may reference images not displayed]

FINDINGS: Brain: No evidence of acute infarction, hemorrhage, hydrocephalus,
extra-axial collection or mass lesion/mass effect. Stable mild
cerebral atrophy.

Vascular: No hyperdense vessel or unexpected calcification.

Skull: Normal. Negative for fracture or focal lesion.

Sinuses/Orbits: No acute finding. Minimal bilateral maxillary sinus
mucosal thickening.

Other: None.
IMPRESSION: 1.  No acute intracranial abnormality.

## 2018-08-11 DIAGNOSIS — L89214 Pressure ulcer of right hip, stage 4: Secondary | ICD-10-CM | POA: Diagnosis not present

## 2018-08-15 DIAGNOSIS — L89214 Pressure ulcer of right hip, stage 4: Secondary | ICD-10-CM | POA: Diagnosis not present

## 2018-08-17 ENCOUNTER — Other Ambulatory Visit (HOSPITAL_COMMUNITY): Payer: Self-pay | Admitting: Radiation Oncology

## 2018-08-17 DIAGNOSIS — Z006 Encounter for examination for normal comparison and control in clinical research program: Secondary | ICD-10-CM

## 2018-08-17 DIAGNOSIS — C61 Malignant neoplasm of prostate: Secondary | ICD-10-CM

## 2018-08-17 DIAGNOSIS — Z923 Personal history of irradiation: Secondary | ICD-10-CM

## 2018-08-25 ENCOUNTER — Inpatient Hospital Stay
Admission: RE | Admit: 2018-08-25 | Discharge: 2018-08-25 | Disposition: A | Discharge status: Still In Hospital | Payer: 59 | Source: Ambulatory Visit | Attending: Radiation Oncology | Admitting: Radiation Oncology

## 2018-08-25 ENCOUNTER — Encounter (HOSPITAL_COMMUNITY): Payer: Self-pay | Admitting: Radiation Oncology

## 2018-08-25 ENCOUNTER — Inpatient Hospital Stay (HOSPITAL_BASED_OUTPATIENT_CLINIC_OR_DEPARTMENT_OTHER)
Admission: RE | Admit: 2018-08-25 | Discharge: 2018-08-25 | Disposition: A | Discharge status: Still In Hospital | Payer: 59 | Source: Ambulatory Visit | Attending: Radiation Oncology | Admitting: Radiation Oncology

## 2018-08-25 ENCOUNTER — Other Ambulatory Visit: Payer: Self-pay

## 2018-08-25 ENCOUNTER — Encounter (HOSPITAL_BASED_OUTPATIENT_CLINIC_OR_DEPARTMENT_OTHER): Payer: Medicare Other | Attending: Internal Medicine

## 2018-08-25 VITALS — BP 173/93 | HR 66 | Temp 98.1°F | Wt 298.9 lb

## 2018-08-25 DIAGNOSIS — C61 Malignant neoplasm of prostate: Secondary | ICD-10-CM

## 2018-08-25 DIAGNOSIS — Z923 Personal history of irradiation: Secondary | ICD-10-CM

## 2018-08-25 DIAGNOSIS — Z08 Encounter for follow-up examination after completed treatment for malignant neoplasm: Secondary | ICD-10-CM

## 2018-08-25 DIAGNOSIS — Z8546 Personal history of malignant neoplasm of prostate: Secondary | ICD-10-CM | POA: Insufficient documentation

## 2018-08-25 DIAGNOSIS — Z006 Encounter for examination for normal comparison and control in clinical research program: Secondary | ICD-10-CM

## 2018-08-25 DIAGNOSIS — L89214 Pressure ulcer of right hip, stage 4: Secondary | ICD-10-CM | POA: Insufficient documentation

## 2018-08-25 LAB — PSA, DIAGNOSTIC: PSA: 0.3 ng/mL (ref <=4.0)

## 2018-08-25 NOTE — Cancer Center Note (Signed)
[place]    Patient Name: Henry [name]  Med Record #: [id number]  Date of Birth:  [birth year]    Diagnosis/Stage:  int risk prostate cancer (T1c, GS3+4, PSA 5.5)    Prior Radiation:  IMRT, 45Gy in 25 fractions to prostate + seminal vesicles + pelvic nodes, followed by a 34.2Gy in 19 fraction boost to prostate + proximal seminal vesicles, completed 05/06/16    Subjective:     Henry [name] is a 63 y.o. 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     He denies urinary urgency, frequency, hematuria, loose bowel movements, hematochezia, rectal urgency, fever, chills, weight loss. PSA draw today 08/25/18 is stable at 0.3ng/mL.      Medications:  Current Outpatient Medications   Medication Sig   . atorvastatin (LIPITOR) 40 mg Oral Tablet Take 1 Tab (40 mg total) by mouth Every evening   . diclofenac sodium (VOLTAREN) 75 mg Oral Tablet, Delayed Release (E.C.) TAKE 1 TAB (75 MG TOTAL) BY MOUTH TWICE DAILY   . dicyclomine (BENTYL) 20 mg Oral Tablet Take 1 Tab (20 mg total) by mouth Four times a day   . Ferrous Sulfate (SLOW RELEASE IRON) 142 mg (45 mg iron) Oral Tablet Sustained Release Take 1 Tab (142 mg total) by mouth Three times a day as needed (Patient taking differently: Take 1 Tab by mouth Once a day )   . GLUCOS CHOND CPLX ADVANCED ORAL Take 2 Tabs by mouth Once a day Glucosamine-Chondroitin 1500 mg   . metoprolol tartrate (LOPRESSOR) 50 mg Oral Tablet Take 1 Tab (50 mg total) by mouth Twice daily   . metroNIDAZOLE (METROGEL) 1 % Gel with Pump by Apply externally route Once a day   . multivitamin Oral Tablet Take 1 Tab by mouth Once a day   . Non-Formulary/Special Preparation (MEDICATION HELP) CBD Oil 13-15 drops daily   . Omeprazole 20 mg Oral Tablet, Delayed Release (E.C.) Take by mouth Monday Wednesday Friday   . traMADol (ULTRAM) 50 mg Oral Tablet Take 1 Tab (50 mg total) by  mouth Every 6 hours as needed for Pain (Patient not taking: Reported on 12/21/2017)       Allergies   Allergen Reactions   . Sulfa (Sulfonamides) Rash   . Shellfish Containing Products Itching and Swelling     Shrimp - eye swelling, itching       Review of Systems:  ROS is negative except as noted in HPI.       Objective:     Vitals:    08/25/18 0827   BP: (!) 173/93   Pulse: 66   Temp: 36.7 C (98.1 F)   SpO2: 97%   Weight: 136 kg (298 lb 15.1 oz)        Overall Pain Rating [facility]: 4  CONSTITUTIONAL: No acute distress, KPS 90, ECOG 1  EYES: conjunctiva clear, pupils equal round and reactive to light  ENT: Normocephalic, atraumatic, moist oral cavity mucous membranes  NECK: No masses or palpable lymphadenopathy  RESPIRATORY: Clear to auscultation bilaterally  CARDIOVASCULAR:Regular rate and rhythm, no murmurs appreciated.  GASTROINTESTINAL: Soft, non-tender, non-distended   GENITOURINARY:  Deferred  MUSCULOSKELETAL:  No tenderness to palpation in joints or spine.    NEUROLOGIC: AOx3, cranial nerves II-XII grossly intact, grossly normal motor and sensory exams  LYMPHATICS: No palpable lymphadenopathy  PSYCHIATRIC: Pleasant, normal affect  LABS/IMAGING: All relevant labs and imaging were reviewed as per HPI.     Assessment/Recommendations:     63 y.o. male withint risk prostate cancer (T1c, GS3+4, PSA 5.5) s/p definitive RT + 6 mo ADT (enrolled in RTOG 0924). There is no clinical or biochemical evidence of recurrence. He will follow-up in21month with repeat PSA and DRE.     SAgustin Cree MD 08/25/2018, 09:19    I reviewed the resident's note, and agree with the findings and plan of care as documented in the resident's note.  Any exceptions/additions have been edited/noted. I personally saw and examined the patient, and reviewed all prior imaging and pathologic findings with him. I spent greater than 50% of a 15 minute visit in discussion of the patient's diagnosis and management.    JTomi Likens DO, MBA,  MS  Assistant Professor, [place]

## 2018-08-25 NOTE — Nurses Notes (Signed)
Return patient visit for Prostate Cancer. Name and DOB verified. Weight and VS per flowsheet. Home medications and allergies reviewed. He received RT to his Prostate from 03/05/16 to 05/06/16. He denies any side effects. Functional Health completed. She c/o arthritic pain to his joints. He takes Voltaren 75 mg twice daily. He denies having a pacemaker or defibrillator. He does have a plate to his R ankle and a R hip replacement. Dr. Candiss Norse and Dr. Marcial Pacas in to see patient. Lorn Junes, Clinical Trials RN, here to draw labs. Collene Gobble, RN

## 2018-08-25 NOTE — Progress Notes (Signed)
RTOG 0924-Pt was in for 24 month follow up.  PSA drawn.  Surveys or study labs not due today.  Physician in to see patient.  AEs reviewed with patient:    L hip pain gr 1-This is being followed by ortho and the patient states that he may be looking at a hip replacement in not so distant future.     The patient denies any other new problems.  No diarrhea, dysuria, visible blood in urine, incontinence, rectal hemorrhage, increased urinary frequency or nocturia.    PSA results stable.  Pt will return for next visit in 6 months.  Lorn Junes, RN

## 2018-08-29 ENCOUNTER — Other Ambulatory Visit (HOSPITAL_COMMUNITY): Payer: Self-pay | Admitting: EXTERNAL

## 2018-08-29 ENCOUNTER — Encounter (HOSPITAL_COMMUNITY): Payer: Self-pay | Admitting: Radiation Oncology

## 2018-08-29 ENCOUNTER — Ambulatory Visit
Admission: RE | Admit: 2018-08-29 | Discharge: 2018-08-29 | Disposition: A | Discharge status: Home / Self Care | Payer: Disability Insurance | Source: Ambulatory Visit | Attending: EXTERNAL | Admitting: EXTERNAL

## 2018-08-29 ENCOUNTER — Ambulatory Visit (HOSPITAL_BASED_OUTPATIENT_CLINIC_OR_DEPARTMENT_OTHER): Payer: Self-pay

## 2018-08-29 ENCOUNTER — Other Ambulatory Visit: Payer: Self-pay

## 2018-08-29 DIAGNOSIS — I10 Essential (primary) hypertension: Secondary | ICD-10-CM

## 2018-08-29 DIAGNOSIS — M170 Bilateral primary osteoarthritis of knee: Secondary | ICD-10-CM | POA: Insufficient documentation

## 2018-08-29 DIAGNOSIS — M1990 Unspecified osteoarthritis, unspecified site: Secondary | ICD-10-CM

## 2018-08-29 DIAGNOSIS — R7309 Other abnormal glucose: Secondary | ICD-10-CM

## 2018-08-29 DIAGNOSIS — E785 Hyperlipidemia, unspecified: Secondary | ICD-10-CM

## 2018-08-29 NOTE — Telephone Encounter (Signed)
Regarding: Lab orders  ----- Message from Julian Reil sent at 08/29/2018  1:46 PM EDT -----  Loletta Parish, MD    Per pt he is requesting his yearly lab orders and asks for a call back once those are placed in the system.

## 2018-08-30 ENCOUNTER — Encounter (HOSPITAL_COMMUNITY): Payer: Self-pay | Admitting: Radiation Oncology

## 2018-08-30 NOTE — Telephone Encounter (Signed)
Labs are ordered.  Please call patient to notify.  Thanks!    Loletta Parish, MD, 08/30/2018, 08:51

## 2018-09-02 NOTE — Telephone Encounter (Signed)
error 
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
## 2018-09-08 DIAGNOSIS — L89214 Pressure ulcer of right hip, stage 4: Secondary | ICD-10-CM | POA: Diagnosis not present

## 2018-09-16 ENCOUNTER — Other Ambulatory Visit (HOSPITAL_BASED_OUTPATIENT_CLINIC_OR_DEPARTMENT_OTHER): Payer: Self-pay | Admitting: Physician Assistant

## 2018-09-16 DIAGNOSIS — M1990 Unspecified osteoarthritis, unspecified site: Secondary | ICD-10-CM

## 2018-09-18 ENCOUNTER — Other Ambulatory Visit (HOSPITAL_BASED_OUTPATIENT_CLINIC_OR_DEPARTMENT_OTHER): Payer: Self-pay | Admitting: Family Medicine

## 2018-09-18 DIAGNOSIS — I10 Essential (primary) hypertension: Secondary | ICD-10-CM

## 2018-09-20 ENCOUNTER — Encounter (HOSPITAL_BASED_OUTPATIENT_CLINIC_OR_DEPARTMENT_OTHER): Payer: Self-pay | Admitting: Family Medicine

## 2018-09-22 ENCOUNTER — Other Ambulatory Visit: Payer: Self-pay

## 2018-09-22 ENCOUNTER — Encounter (HOSPITAL_BASED_OUTPATIENT_CLINIC_OR_DEPARTMENT_OTHER): Payer: Medicare Other | Attending: Internal Medicine

## 2018-09-22 DIAGNOSIS — L89214 Pressure ulcer of right hip, stage 4: Secondary | ICD-10-CM | POA: Diagnosis not present

## 2018-09-27 ENCOUNTER — Encounter (INDEPENDENT_AMBULATORY_CARE_PROVIDER_SITE_OTHER): Payer: Self-pay

## 2018-10-06 DIAGNOSIS — L89214 Pressure ulcer of right hip, stage 4: Secondary | ICD-10-CM | POA: Diagnosis not present

## 2018-10-07 ENCOUNTER — Ambulatory Visit: Payer: 59 | Attending: Family Medicine

## 2018-10-07 ENCOUNTER — Encounter (HOSPITAL_BASED_OUTPATIENT_CLINIC_OR_DEPARTMENT_OTHER): Payer: Self-pay

## 2018-10-07 ENCOUNTER — Other Ambulatory Visit: Payer: Self-pay

## 2018-10-07 VITALS — BP 136/82 | HR 69 | Temp 97.7°F | Ht 67.0 in | Wt 295.6 lb

## 2018-10-07 DIAGNOSIS — B029 Zoster without complications: Secondary | ICD-10-CM | POA: Insufficient documentation

## 2018-10-07 MED ORDER — ACETAMINOPHEN 500 MG CAPSULE
500.0000 mg | ORAL_CAPSULE | Freq: Four times a day (QID) | ORAL | Status: DC | PRN
Start: 2018-10-07 — End: 2023-05-26

## 2018-10-07 NOTE — H&P (Deleted)
FAMILY MEDICINE, [facility] [address]  [place] [id number]    PROGRESS NOTE (outpatient) --- 10/07/2018    SUBJECTIVE:   Patient: Henry [name]        MRN: [id number]        DOB: 24-Aug-1955  Mr. Montellano is a 63 y.o. male  PCP is Loletta Parish, MD  Last saw [facility] 12/21/2017  CC: Rash (on back and spreading to sturnum, started last week. burning)    HPI:   63 y.o. man PMH HTN, HLD, OSA on CPAP, OA/pain knee & hip, IBS, GERD, Prostate cancer.    C/o:  Pain & rash in chest wall    Last Wednesday (9 days ago) noticed anterior chest wall pain. Tender to touch.  A couple days later lower thoracic back pain.  Then noticed erythematous vesicular rash L chest wall radiating around T5 dermatome.  Pain/ itch/ burn.  Never had similar.  Did get shingrix.a couple years ago.  Says pain isn't too bad - intermittent, hasn't needed analgesia.      ROS:  Review of Systems   Constitutional: Negative for activity change, fatigue and fever.   Respiratory: Negative for cough and shortness of breath.    Cardiovascular: Positive for chest pain.   Skin: Positive for rash.       Current Outpatient Medications:   .  Acetaminophen 500 mg Oral Capsule, Take 1 Cap (500 mg total) by mouth Every 6 hours as needed, Disp: , Rfl:   .  atorvastatin (LIPITOR) 40 mg Oral Tablet, Take 1 Tab (40 mg total) by mouth Every evening, Disp: 90 Tab, Rfl: 3  .  diclofenac sodium (VOLTAREN) 75 mg Oral Tablet, Delayed Release (E.C.), Take 1 Tab (75 mg total) by mouth Twice daily, Disp: 180 Tab, Rfl: 0  .  dicyclomine (BENTYL) 20 mg Oral Tablet, Take 1 Tab (20 mg total) by mouth Four times a day, Disp: 120 Tab, Rfl: 11  .  Ferrous Sulfate (SLOW RELEASE IRON) 142 mg (45 mg iron) Oral Tablet Sustained Release, Take 1 Tab (142 mg total) by mouth Three times a day as needed (Patient taking differently: Take 1 Tab by mouth Once a day ), Disp: 90 Tab, Rfl: 2  .  GLUCOS CHOND CPLX ADVANCED ORAL, Take 2 Tabs by mouth Once a day  Glucosamine-Chondroitin 1500 mg, Disp: , Rfl:   .  metoprolol tartrate (LOPRESSOR) 50 mg Oral Tablet, Take 1 Tab (50 mg total) by mouth Twice daily, Disp: 180 Tab, Rfl: 3  .  metroNIDAZOLE (METROGEL) 1 % Gel with Pump, by Apply externally route Once a day, Disp: , Rfl:   .  multivitamin Oral Tablet, Take 1 Tab by mouth Once a day, Disp: , Rfl:   .  Non-Formulary/Special Preparation (MEDICATION HELP), CBD Oil 13-15 drops daily, Disp: , Rfl:   .  Omeprazole 20 mg Oral Tablet, Delayed Release (E.C.), Take by mouth Monday Wednesday Friday, Disp: , Rfl:     Allergies   Allergen Reactions   . Sulfa (Sulfonamides) Rash   . Shellfish Containing Products Itching and Swelling     Shrimp - eye swelling, itching       OBJECTIVE:   BP 136/82 (Site: Left, Patient Position: Sitting, Cuff Size: Adult Large)   Pulse 69   Temp 36.5 C (97.7 F) (Thermal Scan)   Ht 1.702 m (5' 7" )   Wt 134 kg (295 lb 10.2 oz)   SpO2 96%   BMI 46.30  kg/m     Physical Exam  Constitutional:       Appearance: He is ill-appearing.   HENT:      Head: Normocephalic.   Cardiovascular:      Pulses: Normal pulses.      Heart sounds: Normal heart sounds.   Pulmonary:      Effort: Pulmonary effort is normal.      Breath sounds: Normal breath sounds.   Abdominal:      Palpations: Mass:    Skin:     Findings: Rash present.      Comments: Vesicular/ erythematous rash L T5 distribution   Neurological:      General: No focal deficit present.      Mental Status: He is alert.         DATA:  Recent available labs & studies reviewed, as discussed in HPI, A&P.    ASSESSMENT & PLAN:     1. Shingles (Primary)  - beyond window for Valtrex  - discussed natural history. Hopeful for mild course given vaccination+ status. Discussed known complications, sequelae, alarm symptoms  -     Acetaminophen 500 mg Oral Capsule; Take 1 Cap (500 mg total) by mouth Every 6 hours as needed  - may contact Dr Albesa Seen if needed analgesia  - RTC if new symptoms    D. Noralyn Pick, MD,  PGY-2 -- 10/07/2018

## 2018-10-07 NOTE — Progress Notes (Signed)
FAMILY MEDICINE, [facility] [address]   [place] [phone]   PROGRESS NOTE (outpatient) --- 10/07/2018   SUBJECTIVE:   Patient: Henry [name]  MRN: [id number]  DOB: [birth year]   Mr. Leicht is a 63 y.o. male   PCP is Loletta Parish, MD   Last saw [facility] 12/21/2017   CC: Rash (on back and spreading to sturnum, started last week. burning)   HPI:   63 y.o. man PMH HTN, HLD, OSA on CPAP, OA/pain knee & hip, IBS, GERD, Prostate cancer.   C/o: Pain & rash in chest wall   Last Wednesday (9 days ago) noticed anterior chest wall pain. Tender to touch. A couple days later lower thoracic back pain. Then noticed erythematous vesicular rash L chest wall radiating around T5 dermatome. Pain/ itch/ burn. Never had similar. Did get shingrix.a couple years ago. Says pain isn't too bad - intermittent, hasn't needed analgesia.   ROS:   Review of Systems   Constitutional: Negative for activity change, fatigue and fever.   Respiratory: Negative for cough and shortness of breath.   Cardiovascular: Positive for chest pain.   Skin: Positive for rash.     Current Outpatient Medications:   . Acetaminophen 500 mg Oral Capsule, Take 1 Cap (500 mg total) by mouth Every 6 hours as needed, Disp: , Rfl:   . atorvastatin (LIPITOR) 40 mg Oral Tablet, Take 1 Tab (40 mg total) by mouth Every evening, Disp: 90 Tab, Rfl: 3   . diclofenac sodium (VOLTAREN) 75 mg Oral Tablet, Delayed Release (E.C.), Take 1 Tab (75 mg total) by mouth Twice daily, Disp: 180 Tab, Rfl: 0   . dicyclomine (BENTYL) 20 mg Oral Tablet, Take 1 Tab (20 mg total) by mouth Four times a day, Disp: 120 Tab, Rfl: 11   . Ferrous Sulfate (SLOW RELEASE IRON) 142 mg (45 mg iron) Oral Tablet Sustained Release, Take 1 Tab (142 mg total) by mouth Three times a day as needed (Patient taking differently: Take 1 Tab by mouth Once a day ), Disp: 90 Tab, Rfl: 2   . GLUCOS CHOND CPLX ADVANCED ORAL, Take 2 Tabs by mouth Once a day Glucosamine-Chondroitin  1500 mg, Disp: , Rfl:   . metoprolol tartrate (LOPRESSOR) 50 mg Oral Tablet, Take 1 Tab (50 mg total) by mouth Twice daily, Disp: 180 Tab, Rfl: 3   . metroNIDAZOLE (METROGEL) 1 % Gel with Pump, by Apply externally route Once a day, Disp: , Rfl:   . multivitamin Oral Tablet, Take 1 Tab by mouth Once a day, Disp: , Rfl:   . Non-Formulary/Special Preparation (MEDICATION HELP), CBD Oil 13-15 drops daily, Disp: , Rfl:   . Omeprazole 20 mg Oral Tablet, Delayed Release (E.C.), Take by mouth Monday Wednesday Friday, Disp: , Rfl:         Allergies   Allergen Reactions   . Sulfa (Sulfonamides) Rash   . Shellfish Containing Products Itching and Swelling     Shrimp - eye swelling, itching     OBJECTIVE:   BP 136/82 (Site: Left, Patient Position: Sitting, Cuff Size: Adult Large)  Pulse 69  Temp 36.5 C (97.7 F) (Thermal Scan)  Ht 1.702 m (5' 7" )  Wt 134 kg (295 lb 10.2 oz)  SpO2 96%  BMI 46.30 kg/m   Physical Exam   Constitutional:   Appearance: He is ill-appearing.   HENT:   Head: Normocephalic.   Cardiovascular:   Pulses: Normal pulses.   Heart sounds:  Normal heart sounds.   Pulmonary:   Effort: Pulmonary effort is normal.   Breath sounds: Normal breath sounds.   Abdominal:   Palpations: Mass:   Skin:   Findings: Rash present.   Comments: Vesicular/ erythematous rash L T5 distribution   Neurological:   General: No focal deficit present.   Mental Status: He is alert.     DATA:   Recent available labs & studies reviewed, as discussed in HPI, A&P.   ASSESSMENT & PLAN:   1. Shingles (Primary)   - beyond window for Valtrex   - discussed natural history. Hopeful for mild course given vaccination+ status. Discussed known complications, sequelae, alarm symptoms   - Acetaminophen 500 mg Oral Capsule; Take 1 Cap (500 mg total) by mouth Every 6 hours as needed   - may contact Dr Albesa Seen if needed analgesia   - RTC if new symptoms   D. Noralyn Pick, MD, PGY-2 -- 10/07/2018           I saw and examined the patient.  I reviewed  the resident's note.  I agree with the findings and plan of care as documented in the resident's note.  Any exceptions/additions are edited/noted.    Theophilus Kinds, MD

## 2018-10-07 NOTE — Nursing Note (Signed)
10/07/18 0916   Depression Screen   Little interest or pleasure in doing things. 0   Feeling down, depressed, or hopeless 0   PHQ 2 Total 0

## 2018-10-11 ENCOUNTER — Encounter (HOSPITAL_BASED_OUTPATIENT_CLINIC_OR_DEPARTMENT_OTHER): Payer: Self-pay | Admitting: Family Medicine

## 2018-10-17 ENCOUNTER — Encounter (INDEPENDENT_AMBULATORY_CARE_PROVIDER_SITE_OTHER): Payer: Self-pay | Admitting: NURSE PRACTITIONER

## 2018-10-17 NOTE — Nursing Note (Signed)
Faxed DME confirmation order to [facility], confirmation received.  Alvie Heidelberg, Ambulatory Care Assistant  10/17/2018, 10:43

## 2018-10-20 ENCOUNTER — Encounter (HOSPITAL_BASED_OUTPATIENT_CLINIC_OR_DEPARTMENT_OTHER): Payer: Medicare Other | Attending: Internal Medicine

## 2018-10-20 DIAGNOSIS — L89214 Pressure ulcer of right hip, stage 4: Secondary | ICD-10-CM | POA: Insufficient documentation

## 2018-10-21 ENCOUNTER — Other Ambulatory Visit: Payer: Self-pay

## 2018-10-25 ENCOUNTER — Other Ambulatory Visit: Payer: Self-pay

## 2018-10-25 ENCOUNTER — Encounter (HOSPITAL_BASED_OUTPATIENT_CLINIC_OR_DEPARTMENT_OTHER): Payer: Self-pay | Admitting: NURSE PRACTITIONER

## 2018-10-25 ENCOUNTER — Ambulatory Visit (HOSPITAL_BASED_OUTPATIENT_CLINIC_OR_DEPARTMENT_OTHER): Payer: 59 | Admitting: NURSE PRACTITIONER

## 2018-10-25 DIAGNOSIS — G4733 Obstructive sleep apnea (adult) (pediatric): Secondary | ICD-10-CM

## 2018-10-25 NOTE — Progress Notes (Signed)
[facility]    Patient Name: Henry [name] [facility]  MRN#: [id number]  DOB: 17-Jul-1955  Date of Service: 10/25/2018    CC: Sleep Apnea        Last Clinic Visit: 10/28/18    Sleep Disorders Therapy:  Positive Pressure: CPAP 9 cm     Mask:Zest Nasal    Humidifier: Heated    Ramp: Yes    Oxyen: No       HPI: The patient is 63 year old male with history of severe sleep apnea presenting for routine follow up.No concerns or complaints. Continues to do well on therapy. Wife mentions that he is puffing air from his mouth during sleep with the nasal mask.     Apnea Hypopnea index: 53.8 Desat 63%(01/23/10)    Current use of positive pressure therapy: every night    Hours of sleep per night: approx 5 hr to 6 hours a night     Problems with Therapy    Mask: None     Machine: None    Nasal: None    Epworth Sleepiness Scale:7    Sleep related symptoms:  Excessive day time sleepiness: Denies   Nonrestorative sleep: Denies  Witnessed apneas by bed partner: Denies   Awakening with choking: Denies   Nocturnal restlessness: Denies  Insomnia with frequent awakenings: Denies   Lack of concentration: Denies   Cognitive deficits: Denies   Changes in mood: Denies   Morning headaches: Denies   Vivid, strange, or threatening dreams Denies    Sleep structure:  - sleep latency 5 min, sleep maintenance 5 hours, Frequent night time awakening none  - excessive caffeine, soda, alcohol intake: No. Decaffeinated only    Associated diseases: Obesity (Body mass index is 43.82 kg/m.), GERD  Systemic complications:   Hypertension,   Cardiovascular disease,        PAP compliance report:(09/25/18-10/24/18)  DME: [facility]   CPAP   Setting: 9  cm of H2O  Average usage (Days Used): 5 hours 9 mins 24 secs  % of sleep >=4 hours use: 86.7%  Avg air leak time: 0 secs  Residual AHI: 0.5     PAST MEDICAL HISTORY:   Past Medical History:   Diagnosis Date   . Cancer (CMS [facility]) 11/2015    prostate  cancer   . Chronic pain    . CPAP (continuous positive airway pressure) dependence     settings unknown   . Deep vein thrombosis (DVT) (CMS [facility])     left leg   . GERD (gastroesophageal reflux disease)     well controlled on omeprazole   . Heartburn    . HTN    . Hyperlipidemia    . Hypertension    . Irritable bowel syndrome    . Neck problem    . Osteoarthritis of back    . Prostate cancer (CMS [facility]) 12/11/2015   . Sleep apnea    . Wears glasses            ALLERGIES:   Allergies   Allergen Reactions   . Sulfa (Sulfonamides) Rash   . Shellfish Containing Products Itching and Swelling     Shrimp - eye swelling, itching         CURRENT MEDICATIONS:   Prior to Admission medications    Medication Sig Start Date End Date Taking? Authorizing Provider   Acetaminophen 500 mg Oral Capsule Take 1 Cap (500 mg total) by mouth Every 6 hours as needed 10/07/18  Ricke Hey, MD   atorvastatin (LIPITOR) 40 mg Oral Tablet Take 1 Tab (40 mg total) by mouth Every evening 10/19/17   Rosemarie Beath, MD   diclofenac sodium (VOLTAREN) 75 mg Oral Tablet, Delayed Release (E.C.) Take 1 Tab (75 mg total) by mouth Twice daily 09/18/18   Loletta Parish, MD   dicyclomine (BENTYL) 20 mg Oral Tablet Take 1 Tab (20 mg total) by mouth Four times a day 09/19/18   Loletta Parish, MD   Ferrous Sulfate (SLOW RELEASE IRON) 142 mg (45 mg iron) Oral Tablet Sustained Release Take 1 Tab (142 mg total) by mouth Three times a day as needed  Patient taking differently: Take 1 Tab by mouth Once a day  04/26/14   Rosemarie Beath, MD   GLUCOS CHOND CPLX ADVANCED ORAL Take 2 Tabs by mouth Once a day Glucosamine-Chondroitin 1500 mg    Provider, Historical   metoprolol tartrate (LOPRESSOR) 50 mg Oral Tablet Take 1 Tab (50 mg total) by mouth Twice daily 09/19/18   Loletta Parish, MD   metroNIDAZOLE (METROGEL) 1 % Gel with Pump by Apply externally route Once a day    Provider, Historical   multivitamin Oral Tablet Take 1 Tab by mouth Once a day    Provider, Historical      Non-Formulary/Special Preparation (MEDICATION HELP) CBD Oil 13-15 drops daily    Provider, Historical   Omeprazole 20 mg Oral Tablet, Delayed Release (E.C.) Take by mouth Monday Wednesday Friday    Provider, Historical       REVIEW OF SYSTEMS:  Constitutional: negative  Eyes: negative  Ears, nose, mouth, throat, and face: negative  Respiratory: obstructive sleep apnea  Cardiovascular: negative  Gastrointestinal: negative  Genitourinary:negative  Integument/breast: negative  Hematologic/lymphatic: negative  Musculoskeletal:negative  Neurological: negative  Behavioral/Psych: negative  Endocrine: negative  Allergic/Immunologic: negative        PHYSICAL EXAM: The patient's current vitals are BP (!) 150/71   Pulse 67   Temp 36.8 C (98.2 F)   Resp 12   Ht 1.753 m (5' 9" )   Wt 135 kg (296 lb 11.8 oz)   SpO2 96%   BMI 43.82 kg/m   General: NAD, good appearance   Eyes: Conjunctiva clear, EOMI.   Oropharynx: Oropharynx Mallampati Class I  Neck: 19Inches @ cirsothyroid membrane and neck supple  Lungs: clear to auscultation bilaterally  Cardiovascular: S1, S2, RRR without murmur  Abdomen: soft, non-tender, bowel sounds normal and non-tender   Extremities: no cyanosis, clubbing or edema  Skin: Skin warm and dry  Neurologic: grossly normal, AO x 3  Lymphatics: No nodes appreciated        ASSESSMENT/ PLAN:   Henry [name] is a 63 y.o. Male with hx of  1. Obstructive sleep apnea-    *Good response and benefit of therapy  *Apnea well controlled.   *Continue to do well on positive airway pressure therapy and excellent subjective and objectivecompliance.  *Advise obtain replacement equipment every three to six months.   *Advised patient do not drive or operate heavy equipment or machinery if fatigued, drowsy or sleepy due to risk of injury.   2. Morbid obesity- fitness and healthy eating habits encouraged  3. Hypertension      RTC: one year     Lorette Ang, APRN, FNP-BC   10/25/2018, 10:29    Gerrie Nordmann.  Wilson-Carr, APRN, FNP-BC  Family Nurse Practitioner  [facility]  Pulmonary and Sleep Medicine.  Department of Medicine

## 2018-10-26 ENCOUNTER — Ambulatory Visit: Payer: 59

## 2018-10-26 ENCOUNTER — Telehealth (HOSPITAL_BASED_OUTPATIENT_CLINIC_OR_DEPARTMENT_OTHER): Payer: Self-pay | Admitting: Family Medicine

## 2018-10-26 DIAGNOSIS — E785 Hyperlipidemia, unspecified: Secondary | ICD-10-CM

## 2018-10-26 DIAGNOSIS — I10 Essential (primary) hypertension: Secondary | ICD-10-CM | POA: Insufficient documentation

## 2018-10-26 DIAGNOSIS — R7309 Other abnormal glucose: Secondary | ICD-10-CM | POA: Insufficient documentation

## 2018-10-26 LAB — LIPID PANEL
CHOL/HDL RATIO: 3.6
CHOLESTEROL: 139 mg/dL (ref <200)
HDL CHOL: 39 mg/dL (ref >=39)
LDL CALC: 60 mg/dL (ref <=100)
NON-HDL: 100 mg/dL (ref <=190)
TRIGLYCERIDES: 198 mg/dL — ABNORMAL HIGH (ref <=150)
VLDL CALC: 40 mg/dL — ABNORMAL HIGH (ref <30)

## 2018-10-26 LAB — BASIC METABOLIC PANEL
ANION GAP: 9 mmol/L (ref 4–13)
BUN/CREA RATIO: 18 (ref 6–22)
BUN: 14 mg/dL (ref 8–25)
CALCIUM: 9.1 mg/dL (ref 8.5–10.2)
CHLORIDE: 102 mmol/L (ref 96–111)
CO2 TOTAL: 26 mmol/L (ref 22–32)
CREATININE: 0.79 mg/dL (ref 0.62–1.27)
ESTIMATED GFR: >60 mL/min/{1.73_m2} (ref >60)
GLUCOSE: 120 mg/dL (ref 65–139)
POTASSIUM: 4.5 mmol/L (ref 3.5–5.1)
SODIUM: 137 mmol/L (ref 136–145)

## 2018-10-26 LAB — AST (SGOT): AST (SGOT): 34 U/L (ref 8–48)

## 2018-10-26 LAB — ALT (SGPT): ALT (SGPT): 47 U/L (ref <55)

## 2018-10-26 NOTE — Telephone Encounter (Signed)
Called patient prior to appointment to prescreen for COVID-19.      In the last 30 days, have you been in close contact with someone wo has been tested for COVID-19? No    In the last 30 days, have you been tested for COVID-19? NO- proceed with scheduled visit type     Have you had new or worsened shortness of breath in the past 14 days?  No  Have you had a new or worsening cough in the past 14 days? No  Have you had a fever in the past 14 days?  No  Have you experienced a loss of taste or smell in the past 14 days? No  Have you experienced headache with nausea in the past 14 days? No    Prescreen was negative, instructed to present to the clinic for scheduled appointment     Patient informed of visitor policy at this time.     Informed patient that we are requesting all patients and visitors wear a mask when entering the clinic.      Appointment notes have been updated to reflect screening.

## 2018-10-27 ENCOUNTER — Encounter (HOSPITAL_BASED_OUTPATIENT_CLINIC_OR_DEPARTMENT_OTHER): Payer: Self-pay | Admitting: NURSE PRACTITIONER

## 2018-10-27 ENCOUNTER — Ambulatory Visit: Payer: 59 | Attending: Family Medicine | Admitting: Family Medicine

## 2018-10-27 ENCOUNTER — Encounter (HOSPITAL_BASED_OUTPATIENT_CLINIC_OR_DEPARTMENT_OTHER): Payer: Self-pay | Admitting: Family Medicine

## 2018-10-27 ENCOUNTER — Other Ambulatory Visit: Payer: Self-pay

## 2018-10-27 VITALS — BP 128/72 | HR 71 | Temp 97.9°F | Resp 18 | Ht 69.02 in | Wt 298.3 lb

## 2018-10-27 DIAGNOSIS — B029 Zoster without complications: Secondary | ICD-10-CM

## 2018-10-27 DIAGNOSIS — R7309 Other abnormal glucose: Secondary | ICD-10-CM

## 2018-10-27 DIAGNOSIS — E1165 Type 2 diabetes mellitus with hyperglycemia: Secondary | ICD-10-CM | POA: Insufficient documentation

## 2018-10-27 LAB — HGA1C (HEMOGLOBIN A1C WITH EST AVG GLUCOSE)
ESTIMATED AVERAGE GLUCOSE: 134 mg/dL
HEMOGLOBIN A1C: 6.3 % — ABNORMAL HIGH (ref 4.0–5.6)

## 2018-10-27 NOTE — Progress Notes (Signed)
Tech sent script to DME [place] for supplies    CPAP 9 cmH20  Heated humidification  ZEST NASAL MASK SIZE AS NEEDED  Headgear  Filters  Tubing  Water chamber.... 10/27/2018 TM'S RRT RPSGT

## 2018-10-28 NOTE — Progress Notes (Signed)
Subjective:     Patient ID:  Henry [name] is an 63 y.o. male   Chief Complaint:    Chief Complaint   Patient presents with   . Shingles   . Elevated Glucose       Henry [name] is a 63 y.o. male following up for diabetes and shingles outbreak.  Lab Results       Component                Value               Date                       HA1C                     6.3 (H)             10/26/2018              ALLERGIES:   -- Sulfa (Sulfonamides) -- Rash   -- Shellfish Containing Products -- Itching and Swelling    --  Shrimp - eye swelling, itching    Current Outpatient Medications:  Acetaminophen 500 mg Oral Capsule, Take 1 Cap (500 mg total) by mouth Every 6 hours as needed  atorvastatin (LIPITOR) 40 mg Oral Tablet, Take 1 Tab (40 mg total) by mouth Every evening  diclofenac sodium (VOLTAREN) 75 mg Oral Tablet, Delayed Release (E.C.), Take 1 Tab (75 mg total) by mouth Twice daily  dicyclomine (BENTYL) 20 mg Oral Tablet, Take 1 Tab (20 mg total) by mouth Four times a day  Ferrous Sulfate (SLOW RELEASE IRON) 142 mg (45 mg iron) Oral Tablet Sustained Release, Take 1 Tab (142 mg total) by mouth Three times a day as needed (Patient taking differently: Take 1 Tab by mouth Once a day )  GLUCOS CHOND CPLX ADVANCED ORAL, Take 2 Tabs by mouth Once a day Glucosamine-Chondroitin 1500 mg  metoprolol tartrate (LOPRESSOR) 50 mg Oral Tablet, Take 1 Tab (50 mg total) by mouth Twice daily  metroNIDAZOLE (METROGEL) 1 % Gel with Pump, by Apply externally route Once a day  multivitamin Oral Tablet, Take 1 Tab by mouth Once a day  Non-Formulary/Special Preparation (MEDICATION HELP), CBD Oil 13-15 drops daily  Omeprazole 20 mg Oral Tablet, Delayed Release (E.C.), Take by mouth Monday Wednesday Friday                  Review of Systems   Constitutional: Negative.    Respiratory: Negative.    Cardiovascular: Negative.    Gastrointestinal: Negative.    Skin: Positive for rash.   Psychiatric/Behavioral: Negative.      Objective:    Physical Exam  Constitutional:       Appearance: Normal appearance.      Comments: BP 128/72   Pulse 71   Temp 36.6 C (97.9 F)   Resp 18   Ht 1.753 m (5' 9.02")   Wt 135 kg (298 lb 4.5 oz)   SpO2 97%   BMI 44.03 kg/m      Cardiovascular:      Rate and Rhythm: Normal rate and regular rhythm.   Pulmonary:      Effort: Pulmonary effort is normal.      Breath sounds: Normal breath sounds.   Skin:     Comments: Healing rash left back and chest in a dermatomal distribution. No evidence for bacterial skin infection   Neurological:  Mental Status: He is alert and oriented to person, place, and time.   Psychiatric:         Mood and Affect: Mood normal.       Ortho Exam    Assessment & Plan:     (R73.09) Elevated glucose  (primary encounter diagnosis)  Plan: HEMOGLOBIN A1C Standing Order every 3 months         and PRN        Reviewed diabetic diet and foods with low glycemic index, recommended 10% weight loss over 3-3 months, continue current medication.    (B02.9) Shingles  Plan: improving, less pain.

## 2018-11-03 DIAGNOSIS — L89214 Pressure ulcer of right hip, stage 4: Secondary | ICD-10-CM | POA: Diagnosis not present

## 2018-11-17 ENCOUNTER — Other Ambulatory Visit: Payer: Self-pay

## 2018-11-17 ENCOUNTER — Encounter (HOSPITAL_BASED_OUTPATIENT_CLINIC_OR_DEPARTMENT_OTHER): Payer: Medicare Other | Attending: Internal Medicine | Admitting: Internal Medicine

## 2018-11-17 DIAGNOSIS — L89214 Pressure ulcer of right hip, stage 4: Secondary | ICD-10-CM | POA: Insufficient documentation

## 2018-11-17 NOTE — Progress Notes (Addendum)
[name], [name] ([id number]) Visit Report for 11/17/2018 Arrival Information Details Patient Name: Date of Service: HERLEY, BERNARDINI 11/17/2018 12:30 PM Medical Record [id number] Patient Account Number: [id number] Date of Birth/Sex: Treating RN: [birth year] (63 y.o. Lorette Ang, Meta.Reding Primary Care Alyn Jurney: PATIENT, NO Other Clinician: Referring Koltin Wehmeyer: Treating Kayman Snuffer/Extender:Robson, Esperanza Richters in Treatment: 23 Visit Information History Since Last Visit Added or deleted any medications: No Patient Arrived: Ambulatory Any new allergies or adverse reactions: No Arrival Time: 12:52 Had a fall or experienced change in No Accompanied By: sister activities of daily living that may affect Transfer Assistance: None risk of falls: Patient Identification Verified: Yes Signs or symptoms of abuse/neglect since last No Secondary Verification Process Completed: Yes visito Patient Requires Transmission-Based No Hospitalized since last visit: No Precautions: Implantable device outside of the clinic excluding No Patient Has Alerts: No cellular tissue based products placed in the center since last visit: Has Dressing in Place as Prescribed: Yes Pain Present Now: No Electronic Signature(s) Signed: 11/17/2018 6:31:45 PM By: Deon Pilling Entered By: Deon Pilling on 11/17/2018 12:52:38 -------------------------------------------------------------------------------- Encounter Discharge Information Details Patient Name: Date of Service: [name] 11/17/2018 12:30 PM Medical Record [id number] Patient Account Number: [id number] Date of Birth/Sex: Treating RN: [birth year] (63 y.o. Hessie Diener Primary Care Isidoro Santillana: PATIENT, NO Other Clinician: Referring Priyanka Causey: Treating Reonna Finlayson/Extender:Robson, Esperanza Richters in Treatment: 23 Encounter Discharge Information Items Post Procedure Vitals Discharge Condition: Stable Temperature (F): 98.5 Ambulatory Status:  Ambulatory Pulse (bpm): 78 Discharge Destination: Home Respiratory Rate (breaths/min): 20 Transportation: Private Auto Blood Pressure (mmHg): 121/72 Accompanied By: self Schedule Follow-up Appointment: Yes Clinical Summary of Care: Electronic Signature(s) Signed: 11/17/2018 6:31:45 PM By: Deon Pilling Entered By: Deon Pilling on 11/17/2018 13:22:59 -------------------------------------------------------------------------------- Lower Extremity Assessment Details Patient Name: Date of Service: DELSHON, BLANCHFIELD 11/17/2018 12:30 PM Medical Record [id number] Patient Account Number: [id number] Date of Birth/Sex: Treating RN: 05/29/1955 (63 y.o. Hessie Diener Primary Care Loyalty Brashier: PATIENT, NO Other Clinician: Referring Zayra Devito: Treating Mahonri Seiden/Extender:Robson, Esperanza Richters in Treatment: 23 Electronic Signature(s) Signed: 11/17/2018 6:31:45 PM By: Deon Pilling Entered By: Deon Pilling on 11/17/2018 12:53:04 -------------------------------------------------------------------------------- Multi Wound Chart Details Patient Name: Date of Service: [name] 11/17/2018 12:30 PM Medical Record [id number] Patient Account Number: [id number] Date of Birth/Sex: Treating RN: 1955-11-06 (63 y.o. Hessie Diener Primary Care Delany Steury: PATIENT, NO Other Clinician: Referring Loney Peto: Treating Bitania Shankland/Extender:Robson, Esperanza Richters in Treatment: 23 Vital Signs Height(in): 73 Pulse(bpm): 33 Weight(lbs): 150 Blood Pressure(mmHg): 121/72 Body Mass Index(BMI): 20 Temperature(F): 98.5 Respiratory 20 Rate(breaths/min): Photos: [1:No Photos] [N/A:N/A] Wound Location: [1:Right Trochanter] [N/A:N/A] Wounding Event: [1:Pressure Injury] [N/A:N/A] Primary Etiology: [1:Pressure Ulcer] [N/A:N/A] Date Acquired: [1:12/17/2017] [N/A:N/A N/A] Weeks of Treatment: [1:23] [N/A:N/A N/A] Wound Status: [1:Open] [N/A:N/A N/A] Measurements L x W x D [1:0.5x0.3x0.2] [N/A:N/A  N/A] (cm) Area (cm) : [1:0.118] [N/A:N/A N/A] Volume (cm) : [1:0.024] [N/A:N/A N/A] % Reduction in Area: [1:98.80%] [N/A:N/A N/A] % Reduction in Volume: [1:99.90%] [N/A:N/A N/A] Classification: [1:Category/Stage IV] [N/A:N/A N/A] Exudate Amount: [1:Small] [N/A:N/A N/A] Exudate Type: [1:Serosanguineous] [N/A:N/A N/A] Exudate Color: [1:red, brown] [N/A:N/A N/A] Wound Margin: [1:Flat and Intact] [N/A:N/A N/A] Granulation Amount: [1:Large (67-100%)] [N/A:N/A N/A] Granulation Quality: [1:Pink, Pale] [N/A:N/A N/A] Necrotic Amount: [1:Small (1-33%)] [N/A:N/A N/A] Exposed Structures: [1:Fat Layer (Subcutaneous Tissue) Exposed: Yes Fascia: No Tendon: No Muscle: No Joint: No Bone: No] [N/A:N/A N/A] Epithelialization: [1:Large (67-100%)] [N/A:N/A N/A] Debridement: [1:Debridement - Selective/Open Wound] [N/A:N/A N/A] Pre-procedure [1:13:08] [N/A:N/A N/A] Verification/Time Out Taken: Pain Control: [1:Lidocaine 4% Topical Solution] [N/A:N/A N/A] Tissue Debrided: [1:Callus] [N/A:N/A N/A]  Level: [1:Skin/Dermis] [N/A:N/A N/A] Debridement Area (sq cm):0.15 [N/A:N/A N/A] Instrument: [1:Curette] [N/A:N/A N/A] Bleeding: [1:Minimum] [N/A:N/A N/A] Hemostasis Achieved: [1:Pressure] [N/A:N/A N/A] Procedural Pain: [1:0] [N/A:N/A N/A] Post Procedural Pain: [1:0] [N/A:N/A N/A] Debridement Treatment Procedure was tolerated [N/A:N/A N/A] Response: [1:well] Post Debridement [1:0.5x0.3x0.1] [N/A:N/A N/A] Measurements L x W x D (cm) Post Debridement [1:0.012] [N/A:N/A N/A] Volume: (cm) Post Debridement Stage: Category/Stage IV [N/A:N/A N/A N/A N/A] Treatment Notes Wound #1 (Right Trochanter) 1. Cleanse With Wound Cleanser 2. Periwound Care Skin Prep 3. Primary Dressing Applied Collegen AG Hydrogel or K-Y Jelly 4. Secondary Dressing Dry Gauze 5. Secured With [facility] [facility]) Signed: 11/17/2018 6:13:48 PM By: [name] Ham MD Signed: 11/17/2018 6:31:45 PM By: Deon Pilling Entered By: [name] Ham on 11/17/2018 13:39:30 -------------------------------------------------------------------------------- Multi-Disciplinary Care Plan Details Patient Name: Date of Service: [name] 11/17/2018 12:30 PM Medical Record [id number] Patient Account Number: [id number] Date of Birth/Sex: Treating RN: February 13, 1956 (63 y.o. Hessie Diener Primary Care Corissa Oguinn: PATIENT, NO Other Clinician: Referring Jasmond River: Treating Zekiah Coen/Extender:Robson, Esperanza Richters in Treatment: 23 Active Inactive Pressure Nursing Diagnoses: Knowledge deficit related to causes and risk factors for pressure ulcer development Knowledge deficit related to management of pressures ulcers Goals: Patient/caregiver will verbalize risk factors for pressure ulcer development Date Initiated: 06/03/2018 Target Resolution Date: 01/13/2019 Goal Status: Active Patient/caregiver will verbalize understanding of pressure ulcer management Date Initiated: 06/03/2018 Date Inactivated: 06/23/2018 Target Resolution Date: 07/01/2018 Goal Status: Met Interventions: Assess offloading mechanisms upon admission and as needed Assess potential for pressure ulcer upon admission and as needed Provide education on pressure ulcers Notes: Electronic Signature(s) Signed: 11/17/2018 6:31:45 PM By: Deon Pilling Entered By: Deon Pilling on 11/17/2018 12:57:13 -------------------------------------------------------------------------------- Pain Assessment Details Patient Name: Date of Service: [name] 11/17/2018 12:30 PM Medical Record [id number] Patient Account Number: [id number] Date of Birth/Sex: Treating RN: Mar 13, 1955 (63 y.o. Hessie Diener Primary Care Charlisha Market: PATIENT, NO Other Clinician: Referring Merrisa Skorupski: Treating Julion Gatt/Extender:Robson, Esperanza Richters in Treatment: 23 Active Problems Location of Pain Severity and Description of Pain Patient Has Paino No Site  Locations Rate the pain. Current Pain Level: 0 Pain Management and Medication Current Pain Management: Medication: No Cold Application: No Rest: No Massage: No Activity: No T.E.N.S.: No Heat Application: No Leg drop or elevation: No Is the Current Pain Management Adequate: Adequate How does your wound impact your activities of daily livingo Sleep: No Bathing: No Appetite: No Relationship With Others: No Bladder Continence: No Emotions: No Bowel Continence: No Work: No Toileting: No Drive: No Dressing: No Hobbies: No Electronic Signature(s) Signed: 11/17/2018 6:31:45 PM By: Deon Pilling Entered By: Deon Pilling on 11/17/2018 12:52:59 -------------------------------------------------------------------------------- Patient/Caregiver Education Details Patient Name: Date of Service: [name] 10/1/2020andnbsp12:30 PM Medical Record [id number] Patient Account Number: [id number] Date of Birth/Gender: Treating RN: 01-Jul-1955 (63 y.o. Hessie Diener Primary Care Physician: PATIENT, NO Other Clinician: Referring Physician: Treating Physician/Extender:Robson, Esperanza Richters in Treatment: 23 Education Assessment Education Provided To: Patient Education Topics Provided Pressure: Handouts: Pressure Ulcers: Care and Offloading Methods: Explain/Verbal Responses: Reinforcements needed Electronic Signature(s) Signed: 11/17/2018 6:31:45 PM By: Deon Pilling Entered By: Deon Pilling on 11/17/2018 12:57:23 -------------------------------------------------------------------------------- Wound Assessment Details Patient Name: Date of Service: [name] 11/17/2018 12:30 PM Medical Record [id number] Patient Account Number: [id number] Date of Birth/Sex: Treating RN: 1955/11/12 (63 y.o. Hessie Diener Primary Care Dow Blahnik: PATIENT, NO Other Clinician: Referring Versia Mignogna: Treating Feliciana Narayan/Extender:Robson, Esperanza Richters in Treatment: 23 Wound  Status Wound Number: 1 Primary Etiology: Pressure Ulcer Wound Location: Right Trochanter Wound  Status: Open Wounding Event: Pressure Injury Date Acquired: 12/17/2017 Weeks Of Treatment: 23 Clustered Wound: No Photos Wound Measurements Length: (cm) 0.5 % Reducti Width: (cm) 0.3 % Reducti Depth: (cm) 0.2 Epithelia Area: (cm) 0.118 Tunnelin Volume: (cm) 0.024 Undermin Wound Description Classification: Category/Stage IV Wound Margin: Flat and Intact Exudate Amount: Small Exudate Type: Serosanguineous Exudate Color: red, brown Wound Bed Granulation Amount: Large (67-100%) Granulation Quality: Pink, Pale Necrotic Amount: Small (1-33%) Necrotic Quality: Adherent Slough Foul Odor After Cleansing: No Slough/Fibrino Yes Exposed Structure Fascia Exposed: No Fat Layer (Subcutaneous Tissue) Exposed: Yes Tendon Exposed: No Muscle Exposed: No Joint Exposed: No Bone Exposed: No on in Area: 98.8% on in Volume: 99.9% lization: Large (67-100%) g: No ing: No Treatment Notes Wound #1 (Right Trochanter) 1. Cleanse With Wound Cleanser 2. Periwound Care Skin Prep 3. Primary Dressing Applied Collegen AG Hydrogel or K-Y Jelly 4. Secondary Dressing Dry Gauze 5. Secured With [facility] [facility] [employer]) Signed: 11/18/2018 10:38:44 AM By: Mikeal Hawthorne EMT/HBOT Signed: 11/18/2018 6:13:58 PM By: Deon Pilling Previous Signature: 11/17/2018 6:31:45 PM Version By: Deon Pilling Entered By: Mikeal Hawthorne on 11/18/2018 10:30:06 -------------------------------------------------------------------------------- Vitals Details Patient Name: Date of Service: [name] 11/17/2018 12:30 PM Medical Record [id number] Patient Account Number: [id number] Date of Birth/Sex: Treating RN: 05-Mar-1955 (63 y.o. Hessie Diener Primary Care Tarika Mckethan: PATIENT, NO Other Clinician: Referring Jayston Trevino: Treating Quantae Martel/Extender:Robson, Esperanza Richters in Treatment: 23 Vital  Signs Time Taken: 12:52 Temperature (F): 98.5 Height (in): 73 Pulse (bpm): 78 Weight (lbs): 150 Respiratory Rate (breaths/min): 20 Body Mass Index (BMI): 19.8 Blood Pressure (mmHg): 121/72 Reference Range: 80 - 120 mg / dl Electronic Signature(s) Signed: 11/17/2018 6:31:45 PM By: Deon Pilling Entered By: Deon Pilling on 11/17/2018 12:52:52

## 2018-11-17 NOTE — Progress Notes (Signed)
VADIM, CENTOLA ([id number]) Visit Report for 11/17/2018 Debridement Details Patient Name: Date of Service: Martin Singleton, Martin Singleton 11/17/2018 12:30 PM Medical Record [id number] Patient Account Number: [id number] Date of Birth/Sex: Treating RN: 12/13/1955 (63 y.o. Tammy Sours Primary Care Provider: PATIENT, NO Other Clinician: Referring Provider: Treating Provider/Extender:Enma Maeda, Lamar Sprinkles in Treatment: 23 Debridement Performed for Wound #1 Right Trochanter Assessment: Performed By: Physician Maxwell Caul., MD Debridement Type: Debridement Level of Consciousness (Pre- Awake and Alert procedure): Pre-procedure Yes - 13:08 Verification/Time Out Taken: Start Time: 13:09 Pain Control: Lidocaine 4% Topical Solution Total Area Debrided (L x W): 0.5 (cm) x 0.3 (cm) = 0.15 (cm) Tissue and other material Viable, Non-Viable, Callus, Skin: Dermis , Fibrin/Exudate debrided: Level: Skin/Dermis Debridement Description: Selective/Open Wound Instrument: Curette Bleeding: Minimum Hemostasis Achieved: Pressure End Time: 13:11 Procedural Pain: 0 Post Procedural Pain: 0 Response to Treatment: Procedure was tolerated well Level of Consciousness Awake and Alert (Post-procedure): Post Debridement Measurements of Total Wound Length: (cm) 0.5 Stage: Category/Stage IV Width: (cm) 0.3 Depth: (cm) 0.1 Volume: (cm) 0.012 Character of Wound/Ulcer Post Improved Debridement: Post Procedure Diagnosis Same as Pre-procedure Electronic Signature(s) Signed: 11/17/2018 6:13:48 PM By: Baltazar Najjar MD Signed: 11/17/2018 6:31:45 PM By: Shawn Stall Entered By: Baltazar Najjar on 11/17/2018 13:39:54 -------------------------------------------------------------------------------- HPI Details Patient Name: Date of Service: Martin Singleton 11/17/2018 12:30 PM Medical Record [id number] Patient Account Number: [id number] Date of Birth/Sex: Treating RN: [birth year] (63 y.o. Tammy Sours Primary Care Provider: PATIENT, NO Other Clinician: Referring Provider: Treating Provider/Extender:Imir Brumbach, Lamar Sprinkles in Treatment: 23 History of Present Illness HPI Description: ADMISSION 06/03/2018 This is a 63 year old man who apparently was found down on the floor for an extended period of time in August 2019. His care was at [facility]. He was discharged to a behavioral health unit for depression/PTSD. He was noted at some point after this to have a pressure ulcer over the right greater trochanter. He had an MRI done on 12/23/2017 that suggested an abscess and possibly a small focus of acute osteomyelitis. The information in care everywhere is a bit difficult to follow however he was admitted to hospital in December had an operative debridement. I am able to look through the records and very clearly he had a wound VAC on for a period of time although I do not exactly have the duration. The patient states he was treated for MRSA with a prolonged course of IV vancomycin although I do not exactly see the documentation of this at the moment. In fact I cannot see that this was cultured. I can see that at one point he was on daptomycin. He states he had 3 MRIs last one on 04/22/2018. This showed a large area of inflammatory tissue and skin ulceration lateral to the right greater trochanter measuring 6.2 cm. There was noes discrete abscess no discrete osteomyelitis of the greater trochanter. The patient was discharged from hospital/psychiatric care 2 weeks ago and he is now living on his own in [place]. His primary physician is Dr. Windle Guard who he is seen on 2 occasions. He had home health arranged through [facility] and apparently a putting silver alginate although our intake nurse says that he did not have silver alginate on this on arrival today. By the patient's description he is functioning marginally. He is not taking any of his medications even the  psych medication he was supposed to be on for depression [apparently Zoloft]. He still admits to being significantly depressed. Past medical history;  anxiety disorder chronic pain syndrome, esophageal reflux, major depression, hyperlipidemia, overactive bladder, cervical radiculopathy, sensorineural hearing loss bilaterally, the patient no longer smokes. He is not a diabetic. 4/23; admitted to the clinic last week. Swab culture I did of this area last time was negative. He is using silver alginate. In reviewing his history I think the patient will need a follow-up MRI from the 1 last done on 04/22/2018. The big issue here is does he have underlying osteomyelitis. If he does he will need IV antibiotics and hyperbaric oxygen. If not a possible referral to plastic surgery. We have been using silver alginate on this wound. He arrives today with his sister 5/7; the [facility] [place] for psychiatric issues last week. He was inpatient for 4 days. Somebody called our clinic to report that the MRI was repeated in [place] which was negative for osteomyelitis. They are using Iodosorb to the wound surface which suggests to me that they did not identify the undermining area from 6-12 o'clock through the non-adhered tissue in the middle of this large oval wound 5/14; the patient has a difficult wound over the right greater trochanter. Recent MRI done at the [facility] in [place] was negative for osteomyelitis. The wound itself is mostly superficial however there is a linear divot that allows a deep probing. We have been using Iodoflex 5/28 2-week follow-up. Since he was last here the patient has been fired [facility] apparently was belligerent to a staff member. He has been referred [facility] by his primary doctor however they refused to pick him up for reasons that are unclear at this point. We have been using Iodoflex in an attempt to promote  adherence of the underlying granulation. 6/4; patient has been using Iodoflex with some reduction in the dimensions of his surface wound. Nevertheless he still has the same slit with the same degree of undermining especially superiorly this is not come in at all. Does not appear to be any active infection. He has been approved for a snap VAC 6/11; he arrives with some reduction in the surface area but not necessarily the volume in the divot area. I said he was approved for the snap VAC last week but apparently the [facility] also needs to register and approval at some some level. 6/18; wound looks somewhat improved today. We have been using Iodoflex. We apparently still do not have the snap VAC approved through the [facility]. 6/25; wounds look somewhat improved today. We have been using Iodoflex and making steady progress in the surface area. The tunneling depth is now at the superior part of the wound edge. The depth of this is about the same however the undermining area superiorly and inferiorly is improved. We still do not have a VAC 7/9 2-week follow-up. The overall surface area the wound continues to contract we have been using Iodoflex. The undermining tunnel is at the superior part of the wound edge. This still probes inferiorly and superiorly. Unfortunately we still have not heard from the [facility] with regards to the wound VAC in spite of intense efforts of our staff over the last month. 7/23; 2-week follow-up. There continues to be improvement here. He is using Iodoflex. He does not have home health. We were not successful in getting the wound VAC through the [facility]. He appears to be running low on supplies of the Iodoflex and there really is not any way to easily do this through the  VA. They may need to order this online on [place] 8/6-Patient is here for 2-week follow-up, the wound appears the same size as before, patient has not ordered anything from [place] so far, and he is running low on  Iodoflex at home. 8/20. Patient was changed to silver alginate last time and really the wound is a lot better. Smaller. There was no identifiable divot with undermining in this area and the lip here appears to have closed over. There is no infection. His sister is changing the dressing 9/3; silver alginate. Wound is smaller small divot on the superior rim of this but this appears to be clean. He has some form of contact dermatitis well away from the wound area which is either ABDs over the tape. We told the sister that this does not need to extend to this area in terms of the secondary dressing 9/17; silver alginate. Small wound remaining. Substantial scar tissue but everything else looks well 10/1; using silver alginate. Small remaining wound which has improved surface area. Eschar around the wound the surface of which is dry Electronic Signature(s) Signed: 11/17/2018 6:13:48 PM By: Baltazar Najjar MD Entered By: Baltazar Najjar on 11/17/2018 13:40:33 -------------------------------------------------------------------------------- Physical Exam Details Patient Name: Date of Service: Martin Singleton 11/17/2018 12:30 PM Medical Record [id number] Patient Account Number: [id number] Date of Birth/Sex: Treating RN: 06-29-1955 (63 y.o. Tammy Sours Primary Care Provider: PATIENT, NO Other Clinician: Referring Provider: Treating Provider/Extender:Eriverto Byrnes, Lamar Sprinkles in Treatment: 23 Notes Wound exam; right greater trochanter. This is smaller but rolled edges with dry callus. Debrided with a #3 curette. There was no bleeding. I changed to silver collagen because of overall wound Electronic Signature(s) Signed: 11/17/2018 6:13:48 PM By: Baltazar Najjar MD Entered By: Baltazar Najjar on 11/17/2018 13:41:08 -------------------------------------------------------------------------------- Physician Orders Details Patient Name: Date of Service: Martin Singleton 11/17/2018 12:30  PM Medical Record [id number] Patient Account Number: [id number] Date of Birth/Sex: Treating RN: [birth year] (63 y.o. 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 Dressing Change Frequency Wound #1 Right Trochanter Other: - Twice a week Skin Barriers/Peri-Wound Care Wound #1 Right Trochanter Skin Prep Barrier cream - as needed for skin irritation. Wound Cleansing Wound #1 Right Trochanter Clean wound with Wound Cleanser - anasept or vashe Primary Wound Dressing Wound #1 Right Trochanter Silver Collagen - moisten with hydrogel/ KY jelly. Secondary Dressing Wound #1 Right Trochanter Dry Gauze Off-Loading Turn and reposition every 2 hours - avoid laying of right hip. no pressure on right hip. Electronic Signature(s) Signed: 11/17/2018 6:13:48 PM By: Baltazar Najjar MD Signed: 11/17/2018 6:31:45 PM By: Shawn Stall Entered By: Shawn Stall on 11/17/2018 13:12:54 -------------------------------------------------------------------------------- Problem List Details Patient Name: Date of Service: Martin Singleton 11/17/2018 12:30 PM Medical Record [id number] Patient Account Number: [id number] Date of Birth/Sex: Treating RN: 03-31-1955 (63 y.o. 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, stage 4 06/03/2018 No Yes Inactive Problems Resolved Problems ICD-10 Code Description Active Date Resolved Date A49.02 Methicillin resistant Staphylococcus aureus  infection, 06/03/2018 06/03/2018 unspecified site Electronic Signature(s) Signed: 11/17/2018 6:13:48 PM By: Baltazar Najjar MD Entered By: Baltazar Najjar on 11/17/2018 13:39:25 -------------------------------------------------------------------------------- Progress Note  Details Patient Name: Date of Service: Martin Singleton, Martin Singleton 11/17/2018 12:30 PM Medical Record [id number] Patient Account Number: [id number] Date of Birth/Sex: Treating RN: 06-Dec-1955 (63 y.o. Tammy Sours Primary Care Provider: PATIENT, NO Other Clinician: Referring Provider: Treating Provider/Extender:Mayer Vondrak, Lamar Sprinkles in Treatment: 23 Subjective History of Present Illness (HPI) ADMISSION 06/03/2018 This is a 63 year old man who apparently was found down on the floor for an extended period of time in August 2019. His care was at [facility]. He was discharged to a behavioral health unit for depression/PTSD. He was noted at some point after this to have a pressure ulcer over the right greater trochanter. He had an MRI done on 12/23/2017 that suggested an abscess and possibly a small focus of acute osteomyelitis. The information in care everywhere is a bit difficult to follow however he was admitted to hospital in December had an operative debridement. I am able to look through the records and very clearly he had a wound VAC on for a period of time although I do not exactly have the duration. The patient states he was treated for MRSA with a prolonged course of IV vancomycin although I do not exactly see the documentation of this at the moment. In fact I cannot see that this was cultured. I can see that at one point he was on daptomycin. He states he had 3 MRIs last one on 04/22/2018. This showed a large area of inflammatory tissue and skin ulceration lateral to the right greater trochanter measuring 6.2 cm. There was noes discrete abscess no discrete osteomyelitis of the greater trochanter. The  patient was discharged from hospital/psychiatric care 2 weeks ago and he is now living on his own in [place]. His primary physician is Dr. Windle Guard who he is seen on 2 occasions. He had home health arranged through [facility] and apparently a putting silver alginate although our intake nurse says that he did not have silver alginate on this on arrival today. By the patient's description he is functioning marginally. He is not taking any of his medications even the psych medication he was supposed to be on for depression [apparently Zoloft]. He still admits to being significantly depressed. Past medical history; anxiety disorder chronic pain syndrome, esophageal reflux, major depression, hyperlipidemia, overactive bladder, cervical radiculopathy, sensorineural hearing loss bilaterally, the patient no longer smokes. He is not a diabetic. 4/23; admitted to the clinic last week. Swab culture I did of this area last time was negative. He is using silver alginate. In reviewing his history I think the patient will need a follow-up MRI from the 1 last done on 04/22/2018. The big issue here is does he have underlying osteomyelitis. If he does he will need IV antibiotics and hyperbaric oxygen. If not a possible referral to plastic surgery. We have been using silver alginate on this wound. He arrives today with his sister 5/7; the patient was admitted to the [place] in [place] predominantly for psychiatric issues last week. He was inpatient for 4 days. Somebody called our clinic to report that the MRI was repeated in [place] which was negative for osteomyelitis. They are using Iodosorb to the wound surface which suggests to me that they did not identify the undermining area from 6-12 o'clock through the non-adhered tissue in the middle of this large oval wound 5/14; the patient has a difficult wound over the right greater trochanter. Recent MRI done at the [place] in [place] was negative  for osteomyelitis. The wound itself is  mostly superficial however there is a linear divot that allows a deep probing. We have been using Iodoflex 5/28 2-week follow-up. Since he was last here the patient has been fired by [facility] home health apparently was belligerent to a staff member. He has been referred to [facility] by his primary doctor however they refused to pick him up for reasons that are unclear at this point. We have been using Iodoflex in an attempt to promote adherence of the underlying granulation. 6/4; patient has been using Iodoflex with some reduction in the dimensions of his surface wound. Nevertheless he still has the same slit with the same degree of undermining especially superiorly this is not come in at all. Does not appear to be any active infection. He has been approved for a snap VAC 6/11; he arrives with some reduction in the surface area but not necessarily the volume in the divot area. I said he was approved for the snap VAC last week but apparently the [place] also needs to register and approval at some some level. 6/18; wound looks somewhat improved today. We have been using Iodoflex. We apparently still do not have the snap VAC approved through the [place] according to [facility]. 6/25; wounds look somewhat improved today. We have been using Iodoflex and making steady progress in the surface area. The tunneling depth is now at the superior part of the wound edge. The depth of this is about the same however the undermining area superiorly and inferiorly is improved. We still do not have a VAC 7/9 2-week follow-up. The overall surface area the wound continues to contract we have been using Iodoflex. The undermining tunnel is at the superior part of the wound edge. This still probes inferiorly and superiorly. Unfortunately we still have not heard from the [place] with regards to the wound VAC in spite of intense efforts of our staff over the last month. 7/23; 2-week follow-up. There  continues to be improvement here. He is using Iodoflex. He does not have home health. We were not successful in getting the wound VAC through the [place]. He appears to be running low on supplies of the Iodoflex and there really is not any way to easily do this through the [place]. They may need to order this online on [place] 8/6-Patient is here for 2-week follow-up, the wound appears the same size as before, patient has not ordered anything from [place] so far, and he is running low on Iodoflex at home. 8/20. Patient was changed to silver alginate last time and really the wound is a lot better. Smaller. There was no identifiable divot with undermining in this area and the lip here appears to have closed over. There is no infection. His sister is changing the dressing 9/3; silver alginate. Wound is smaller small divot on the superior rim of this but this appears to be clean. He has some form of contact dermatitis well away from the wound area which is either ABDs over the tape. We told the sister that this does not need to extend to this area in terms of the secondary dressing 9/17; silver alginate. Small wound remaining. Substantial scar tissue but everything else looks well 10/1; using silver alginate. Small remaining wound which has improved surface area. Eschar around the wound the surface of which is dry Objective Constitutional Vitals Time Taken: 12:52 PM, Height: 73 in, Weight: 150 lbs, BMI: 19.8, Temperature: 98.5 F, Pulse: 78 bpm, Respiratory Rate: 20 breaths/min, Blood Pressure: 121/72 mmHg. Integumentary (Hair, Skin) Wound #1  status is Open. Original cause of wound was Pressure Injury. The wound is located on the Right Trochanter. The wound measures 0.5cm length x 0.3cm width x 0.2cm depth; 0.118cm^2 area and 0.024cm^3 volume. There is Fat Layer (Subcutaneous Tissue) Exposed exposed. There is no tunneling or undermining noted. There is a small amount of serosanguineous drainage noted. The  wound margin is flat and intact. There is large (67- 100%) pink, pale granulation within the wound bed. There is a small (1-33%) amount of necrotic tissue within the wound bed including Adherent Slough. Assessment Active Problems ICD-10 Pressure ulcer of right hip, stage 4 Procedures Wound #1 Pre-procedure diagnosis of Wound #1 is a Pressure Ulcer located on the Right Trochanter . There was a Selective/Open Wound Skin/Dermis Debridement with a total area of 0.15 sq cm performed by Ricard Dillon., MD. With the following instrument(s): Curette to remove Viable and Non-Viable tissue/material. Material removed includes Callus, Skin: Dermis, and Fibrin/Exudate after achieving pain control using Lidocaine 4% Topical Solution. A time out was conducted at 13:08, prior to the start of the procedure. A Minimum amount of bleeding was controlled with Pressure. The procedure was tolerated well with a pain level of 0 throughout and a pain level of 0 following the procedure. Post Debridement Measurements: 0.5cm length x 0.3cm width x 0.1cm depth; 0.012cm^3 volume. Post debridement Stage noted as Category/Stage IV. Character of Wound/Ulcer Post Debridement is improved. Post procedure Diagnosis Wound #1: Same as Pre-Procedure Plan Follow-up Appointments: Return Appointment in 2 weeks. Dressing Change Frequency: Wound #1 Right Trochanter: Other: - Twice a week Skin Barriers/Peri-Wound Care: Wound #1 Right Trochanter: Skin Prep Barrier cream - as needed for skin irritation. Wound Cleansing: Wound #1 Right Trochanter: Clean wound with Wound Cleanser - anasept or vashe Primary Wound Dressing: Wound #1 Right Trochanter: Silver Collagen - moisten with hydrogel/ KY jelly. Secondary Dressing: Wound #1 Right Trochanter: Dry Gauze Off-Loading: Turn and reposition every 2 hours - avoid laying of right hip. no pressure on right hip. 1 I changed to moistened silver collagen on the right greater  trochanter see if this will promote some epithelialization. Electronic Signature(s) Signed: 11/17/2018 6:13:48 PM By: [name] Ham MD Entered By: [name] Ham on 11/17/2018 13:41:45 -------------------------------------------------------------------------------- SuperBill Details Patient Name: Date of Service: Martin Singleton 11/17/2018 Medical Record [id number] Patient Account Number: [id number] Date of Birth/Sex: Treating RN: 1955/06/03 (63 y.o. 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

## 2018-11-20 ENCOUNTER — Other Ambulatory Visit (HOSPITAL_BASED_OUTPATIENT_CLINIC_OR_DEPARTMENT_OTHER): Payer: Self-pay

## 2018-11-20 ENCOUNTER — Other Ambulatory Visit (HOSPITAL_BASED_OUTPATIENT_CLINIC_OR_DEPARTMENT_OTHER): Payer: Self-pay | Admitting: Family Medicine

## 2018-11-20 DIAGNOSIS — M1990 Unspecified osteoarthritis, unspecified site: Secondary | ICD-10-CM

## 2018-11-20 DIAGNOSIS — E785 Hyperlipidemia, unspecified: Secondary | ICD-10-CM

## 2018-11-25 ENCOUNTER — Inpatient Hospital Stay
Admission: RE | Admit: 2018-11-25 | Discharge: 2018-11-25 | Disposition: A | Discharge status: Still In Hospital | Payer: 59 | Source: Ambulatory Visit | Attending: Radiation Oncology | Admitting: Radiation Oncology

## 2018-11-25 ENCOUNTER — Inpatient Hospital Stay
Admission: RE | Admit: 2018-11-25 | Discharge: 2018-11-25 | Disposition: A | Discharge status: Still In Hospital | Payer: 59 | Source: Ambulatory Visit

## 2018-11-25 ENCOUNTER — Other Ambulatory Visit: Payer: Self-pay

## 2018-11-25 VITALS — BP 176/85 | HR 65 | Temp 97.7°F | Wt 297.0 lb

## 2018-11-25 DIAGNOSIS — Z08 Encounter for follow-up examination after completed treatment for malignant neoplasm: Secondary | ICD-10-CM

## 2018-11-25 DIAGNOSIS — Z79899 Other long term (current) drug therapy: Secondary | ICD-10-CM | POA: Insufficient documentation

## 2018-11-25 DIAGNOSIS — Z8546 Personal history of malignant neoplasm of prostate: Secondary | ICD-10-CM

## 2018-11-25 DIAGNOSIS — Z923 Personal history of irradiation: Secondary | ICD-10-CM | POA: Insufficient documentation

## 2018-11-25 DIAGNOSIS — C61 Malignant neoplasm of prostate: Secondary | ICD-10-CM | POA: Insufficient documentation

## 2018-11-25 LAB — PSA, DIAGNOSTIC: PSA: 0.4 ng/mL (ref <=4.0)

## 2018-11-25 NOTE — Nurses Notes (Signed)
Pt to RT for return pt visit. Dr. Marcial Pacas into see pt. PSA drawn and sent.  JHallRN

## 2018-11-25 NOTE — Cancer Center Note (Signed)
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 He denies urinary urgency, frequency, hematuria, loose bowel movements, hematochezia, rectal urgency, fever, chills, weight loss. Reports some difficulties achieving erection but able to sustain. PSA draw on last check (08/25/18) is stable at 0.3ng/mL.     Medications:  Current Outpatient Medications   Medication Sig   . atorvastatin (LIPITOR) 40 mg Oral Tablet Take 1 Tab (40 mg total) by mouth Every evening   . diclofenac sodium (VOLTAREN) 75 mg Oral Tablet, Delayed Release (E.C.) TAKE 1 TAB (75 MG TOTAL) BY MOUTH TWICE DAILY   . dicyclomine (BENTYL) 20 mg Oral Tablet Take 1 Tab (20 mg total) by mouth Four times a day   . Ferrous Sulfate (SLOW RELEASE IRON) 142 mg (45 mg iron) Oral Tablet Sustained Release Take 1 Tab (142 mg total) by mouth Three times a day as needed (Patient taking differently: Take 1 Tab by mouth Once a day )   . GLUCOS CHOND CPLX ADVANCED ORAL Take 2 Tabs by mouth Once a day Glucosamine-Chondroitin 1500 mg   . metoprolol tartrate (LOPRESSOR) 50 mg Oral Tablet Take 1 Tab (50 mg total) by mouth Twice daily   . metroNIDAZOLE (METROGEL) 1 % Gel with Pump by Apply externally route Once a day   . multivitamin Oral Tablet Take 1 Tab by mouth Once a day   . Non-Formulary/Special Preparation (MEDICATION HELP) CBD Oil 13-15 drops daily   . Omeprazole 20 mg Oral Tablet, Delayed Release (E.C.) Take by mouth Monday Wednesday  Friday   . traMADol (ULTRAM) 50 mg Oral Tablet Take 1 Tab (50 mg total) by mouth Every 6 hours as needed for Pain (Patient not taking: Reported on 12/21/2017)       Allergies   Allergen Reactions   . Sulfa (Sulfonamides) Rash   . Shellfish Containing Products Itching and Swelling     Shrimp - eye swelling, itching       Review of Systems:  ROS is negative except as noted in HPI.       Objective:     Vitals:    08/25/18 0827   BP: (!) 173/93   Pulse: 66   Temp: 36.7 C (98.1 F)   SpO2: 97%   Weight: 136 kg (298 lb 15.1 oz)        Overall Pain Rating [facility]: 4  CONSTITUTIONAL: No acute distress, KPS 90, ECOG 1  EYES: conjunctiva clear, pupils equal round and reactive to light  ENT: Normocephalic, atraumatic, moist oral cavity mucous membranes  NECK: No masses or palpable lymphadenopathy  RESPIRATORY: Clear to auscultation bilaterally  CARDIOVASCULAR:Regular rate and rhythm, no murmurs appreciated.  GASTROINTESTINAL: Soft, non-tender, non-distended   GENITOURINARY:  DRE revealed normal and smooth, normal size prostate  MUSCULOSKELETAL:  No tenderness to palpation in joints or spine.    NEUROLOGIC: AOx3, cranial nerves II-XII grossly intact, grossly normal motor and  sensory exams  LYMPHATICS: No palpable lymphadenopathy  PSYCHIATRIC: Pleasant, normal affect    LABS/IMAGING: All relevant labs and imaging were reviewed as per HPI.     Assessment/Recommendations:     63 y.o. male withint risk prostate cancer (T1c, GS3+4, PSA 5.5) s/p definitive RT + 6 mo ADT (enrolled in RTOG 0924). There is no clinical or biochemical evidence of recurrence. He will follow-up in35month with repeat PSA and DRE.     I personally saw and examined the patient, and reviewed all prior imaging and pathologic findings with him. I spent greater than 50% of a 30 minute visit in discussion of the patient's diagnosis and management.    JTomi Likens DO, MBA, MS  Assistant Professor, [place]

## 2018-11-28 ENCOUNTER — Encounter (HOSPITAL_COMMUNITY): Payer: Self-pay | Admitting: Radiation Oncology

## 2018-12-01 ENCOUNTER — Encounter (HOSPITAL_BASED_OUTPATIENT_CLINIC_OR_DEPARTMENT_OTHER): Payer: Medicare Other | Admitting: Internal Medicine

## 2018-12-01 ENCOUNTER — Other Ambulatory Visit: Payer: Self-pay

## 2018-12-01 DIAGNOSIS — L89214 Pressure ulcer of right hip, stage 4: Secondary | ICD-10-CM | POA: Diagnosis not present

## 2018-12-02 NOTE — Progress Notes (Signed)
[place], SCHROADER ([id number]) Visit Report for 12/01/2018 Debridement Details Patient Name: Date of Service: Martin Singleton, Martin Singleton 12/01/2018 12:30 Singleton Medical Record [id number] Patient Account Number: [id number] Date of Birth/Sex: May 18, 1955 (63 y.o. M) Treating RN: Zandra Abts Primary Care Provider: PATIENT, NO Other Clinician: Referring Provider: Treating Provider/Extender:, Lamar Sprinkles in Treatment: 25 Debridement Performed for Wound #1 Right Trochanter Assessment: Performed By: Physician Maxwell Caul., MD Debridement Type: Debridement Level of Consciousness (Pre- Awake and Alert procedure): Pre-procedure Yes - 13:07 Verification/Time Out Taken: Start Time: 13:07 Total Area Debrided (L x W): 0.5 (cm) x 0.2 (cm) = 0.1 (cm) Tissue and other material Viable, Non-Viable, Slough, Subcutaneous, Slough debrided: Level: Skin/Subcutaneous Tissue Debridement Description: Excisional Instrument: Curette Bleeding: Minimum Hemostasis Achieved: Pressure End Time: 13:08 Procedural Pain: 0 Post Procedural Pain: 0 Response to Treatment: Procedure was tolerated well Level of Consciousness Awake and Alert (Post-procedure): Post Debridement Measurements of Total Wound Length: (cm) 0.5 Stage: Category/Stage IV Width: (cm) 0.2 Depth: (cm) 0.1 Volume: (cm) 0.008 Character of Wound/Ulcer Post Improved Debridement: Post Procedure Diagnosis Same as Pre-procedure Electronic Signature(s) Signed: 12/01/2018 5:35:18 Singleton By: Baltazar Najjar MD Signed: 12/02/2018 6:00:55 Singleton By: Zandra Abts RN, BSN Entered By: Baltazar Najjar on 12/01/2018 13:12:44 -------------------------------------------------------------------------------- HPI Details Patient Name: Date of Service: Martin Singleton 12/01/2018 12:30 Singleton Medical Record [id number] Patient Account Number: [id number] Date of Birth/Sex: Treating RN: Jul 08, 1955 (63 y.o. Martin Singleton Primary Care Provider: PATIENT,  NO Other Clinician: Referring Provider: Treating Provider/Extender:, Lamar Sprinkles in Treatment: 25 History of Present Illness HPI Description: ADMISSION 06/03/2018 This is a 63 year old man who apparently was found down on the floor for an extended period of time in August 2019. His care was at [facility]. He was discharged to a behavioral health unit for depression/PTSD. He was noted at some point after this to have a pressure ulcer over the right greater trochanter. He had an MRI done on 12/23/2017 that suggested an abscess and possibly a small focus of acute osteomyelitis. The information in care everywhere is a bit difficult to follow however he was admitted to hospital in December had an operative debridement. I am able to look through the records and very clearly he had a wound VAC on for a period of time although I do not exactly have the duration. The patient states he was treated for MRSA with a prolonged course of IV vancomycin although I do not exactly see the documentation of this at the moment. In fact I cannot see that this was cultured. I can see that at one point he was on daptomycin. He states he had 3 MRIs last one on 04/22/2018. This showed a large area of inflammatory tissue and skin ulceration lateral to the right greater trochanter measuring 6.2 cm. There was noes discrete abscess no discrete osteomyelitis of the greater trochanter. The patient was discharged from hospital/psychiatric care 2 weeks ago and he is now living on his own in [place]. His primary physician is Dr. Windle Guard who he is seen on 2 occasions. He had home health arranged through [facility] and apparently a putting silver alginate although our intake nurse says that he did not have silver alginate on this on arrival today. By the patient's description he is functioning marginally. He is not taking any of his medications even the psych medication he was supposed to be on  for depression [apparently Zoloft]. He still admits to being significantly depressed. Past medical history; anxiety disorder chronic pain syndrome, esophageal  reflux, major depression, hyperlipidemia, overactive bladder, cervical radiculopathy, sensorineural hearing loss bilaterally, the patient no longer smokes. He is not a diabetic. 4/23; admitted to the clinic last week. Swab culture I did of this area last time was negative. He is using silver alginate. In reviewing his history I think the patient will need a follow-up MRI from the 1 last done on 04/22/2018. The big issue here is does he have underlying osteomyelitis. If he does he will need IV antibiotics and hyperbaric oxygen. If not a possible referral to plastic surgery. We have been using silver alginate on this wound. He arrives today with his sister 5/7; the patient was admitted to the [place] in [place] apparently predominantly for psychiatric issues last week. He was inpatient for 4 days. Somebody called our clinic to report that the MRI was repeated in [place] which was negative for osteomyelitis. They are using Iodosorb to the wound surface which suggests to me that they did not identify the undermining area from 6-12 o'clock through the non-adhered tissue in the middle of this large oval wound 5/14; the patient has a difficult wound over the right greater trochanter. Recent MRI done at the [place] in [place] was negative for osteomyelitis. The wound itself is mostly superficial however there is a linear divot that allows a deep probing. We have been using Iodoflex 5/28 2-week follow-up. Since he was last here the patient has been fired by [facility] home health apparently was belligerent to a staff member. He has been referred to [facility] by his primary doctor however they refused to pick him up for reasons that are unclear at this point. We have been using Iodoflex in an attempt to promote adherence of the underlying  granulation. 6/4; patient has been using Iodoflex with some reduction in the dimensions of his surface wound. Nevertheless he still has the same slit with the same degree of undermining especially superiorly this is not come in at all. Does not appear to be any active infection. He has been approved for a snap VAC 6/11; he arrives with some reduction in the surface area but not necessarily the volume in the divot area. I said he was approved for the snap VAC last week but apparently the [place] also needs to register and approval at some some level. 6/18; wound looks somewhat improved today. We have been using Iodoflex. We apparently still do not have the snap VAC approved through the [place] according to [facility]. 6/25; wounds look somewhat improved today. We have been using Iodoflex and making steady progress in the surface area. The tunneling depth is now at the superior part of the wound edge. The depth of this is about the same however the undermining area superiorly and inferiorly is improved. We still do not have a VAC 7/9 2-week follow-up. The overall surface area the wound continues to contract we have been using Iodoflex. The undermining tunnel is at the superior part of the wound edge. This still probes inferiorly and superiorly. Unfortunately we still have not heard from the [place] with regards to the wound VAC in spite of intense efforts of our staff over the last month. 7/23; 2-week follow-up. There continues to be improvement here. He is using Iodoflex. He does not have home health. We were not successful in getting the wound VAC through the [place]. He appears to be running low on supplies of the Iodoflex and there really is not any way to easily do this through the [place]. They may need to order  this online on [employer] 8/6-Patient is here for 2-week follow-up, the wound appears the same size as before, patient has not ordered anything from [place] so far, and he is running low on Iodoflex at home. 8/20.  Patient was changed to silver alginate last time and really the wound is a lot better. Smaller. There was no identifiable divot with undermining in this area and the lip here appears to have closed over. There is no infection. His sister is changing the dressing 9/3; silver alginate. Wound is smaller small divot on the superior rim of this but this appears to be clean. He has some form of contact dermatitis well away from the wound area which is either ABDs over the tape. We told the sister that this does not need to extend to this area in terms of the secondary dressing 9/17; silver alginate. Small wound remaining. Substantial scar tissue but everything else looks well 10/1; using silver alginate. Small remaining wound which has improved surface area. Eschar around the wound the surface of which is dry 10/15 2-week follow-up. I changed him to silver collagen last time I really felt the small area might be closed however once again he has eschar around the surface which is dry and requires debridement. His sister is changing his dressing although she is going to the hospital for a knee replacement they apparently have a friend who will do the dressing Electronic Signature(s) Signed: 12/01/2018 5:35:18 Singleton By: Baltazar Najjar MD Entered By: Baltazar Najjar on 12/01/2018 13:13:21 -------------------------------------------------------------------------------- Physical Exam Details Patient Name: Date of Service: Martin Singleton 12/01/2018 12:30 Singleton Medical Record [id number] Patient Account Number: [id number] Date of Birth/Sex: Treating RN: 09-Aug-1955 (63 y.o. Martin Singleton Primary Care Provider: PATIENT, NO Other Clinician: Referring Provider: Treating Provider/Extender:, Lamar Sprinkles in Treatment: 25 Constitutional Sitting or standing Blood Pressure is within target range for patient.. Pulse regular and within target range for patient.Marland Kitchen Respirations regular, non-labored and  within target range.. Temperature is normal and within the target range for the patient.Marland Kitchen Appears in no distress. Notes Wound exam; right greater trochanter. Very small wound but with dry scaled edges and debris. I used a #3 curette to debride the edges as well as the wound surface this cleans up quite nicely. There is nothing but skin over bone in this area however. No evidence of infection Electronic Signature(s) Signed: 12/01/2018 5:35:18 Singleton By: Baltazar Najjar MD Entered By: Baltazar Najjar on 12/01/2018 13:14:10 -------------------------------------------------------------------------------- Physician Orders Details Patient Name: Date of Service: Martin Singleton. 12/01/2018 12:30 Singleton Medical Record [id number] Patient Account Number: [id number] Date of Birth/Sex: Treating RN: 1955/09/23 (63 y.o. 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 Dressing Change Frequency Wound #1 Right Trochanter Other: - Twice a week Skin Barriers/Peri-Wound Care Wound #1 Right Trochanter Skin Prep Barrier cream - as needed for skin irritation. Wound Cleansing Wound #1 Right Trochanter Clean wound with Wound Cleanser - anasept or vashe Primary Wound Dressing Wound #1 Right Trochanter Silver Collagen - moisten with hydrogel/ KY jelly. Secondary Dressing Wound #1 Right Trochanter Dry Gauze - secure with tape Off-Loading Turn and reposition every 2 hours - avoid laying of right hip. no pressure on right hip. Electronic Signature(s) Signed: 12/01/2018 5:35:18 Singleton By: Baltazar Najjar MD Signed: 12/02/2018 6:00:55 Singleton By: Zandra Abts  RN, BSN Entered By: Zandra Abts on 12/01/2018  13:10:26 -------------------------------------------------------------------------------- Problem List Details Patient Name: Date of Service: JAIMIN, KRUPKA 12/01/2018 12:30 Singleton Medical Record [id number] Patient Account Number: [id number] Date of Birth/Sex: Treating RN: Jul 10, 1955 (64 y.o. 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, stage 4 06/03/2018 No Yes Inactive Problems Resolved Problems ICD-10 Code Description Active Date Resolved Date A49.02 Methicillin resistant Staphylococcus aureus infection, 06/03/2018 06/03/2018 unspecified site Electronic Signature(s) Signed: 12/01/2018 5:35:18 Singleton By: Baltazar Najjar MD Entered By: Baltazar Najjar on 12/01/2018 13:12:26 -------------------------------------------------------------------------------- Progress Note Details Patient Name: Date of Service: Martin Singleton 12/01/2018 12:30 Singleton Medical Record [id number] Patient Account Number: [id number] Date of Birth/Sex: Treating RN: 16-Apr-1955 (63 y.o. Martin Singleton Primary Care Provider: PATIENT, NO Other Clinician: Referring Provider: Treating Provider/Extender:, Lamar Sprinkles in Treatment: 25 Subjective History of Present Illness (HPI) ADMISSION 06/03/2018 This is a 63 year old man who apparently was found down on the floor for an extended period of time in August 2019. His care was at [facility]. He was discharged to a behavioral health unit for depression/PTSD. He was noted at some point after this to have a pressure ulcer over the right greater trochanter. He had an MRI done on 12/23/2017 that suggested an abscess and possibly a small focus of acute osteomyelitis. The information in care everywhere is a  bit difficult to follow however he was admitted to hospital in December had an operative debridement. I am able to look through the records and very clearly he had a wound VAC on for a period of time although I do not exactly have the duration. The patient states he was treated for MRSA with a prolonged course of IV vancomycin although I do not exactly see the documentation of this at the moment. In fact I cannot see that this was cultured. I can see that at one point he was on daptomycin. He states he had 3 MRIs last one on 04/22/2018. This showed a large area of inflammatory tissue and skin ulceration lateral to the right greater trochanter measuring 6.2 cm. There was noes discrete abscess no discrete osteomyelitis of the greater trochanter. The patient was discharged from hospital/psychiatric care 2 weeks ago and he is now living on his own in [place]. His primary physician is Dr. Windle Guard who he is seen on 2 occasions. He had home health arranged through [facility] and apparently a putting silver alginate although our intake nurse says that he did not have silver alginate on this on arrival today. By the patient's description he is functioning marginally. He is not taking any of his medications even the psych medication he was supposed to be on for depression [apparently Zoloft]. He still admits to being significantly depressed. Past medical history; anxiety disorder chronic pain syndrome, esophageal reflux, major depression, hyperlipidemia, overactive bladder, cervical radiculopathy, sensorineural hearing loss bilaterally, the patient no longer smokes. He is not a diabetic. 4/23; admitted to the clinic last week. Swab culture I did of this area last time was negative. He is using silver alginate. In reviewing his history I think the patient will need a follow-up MRI from the 1 last done on 04/22/2018. The big issue here is does he have underlying osteomyelitis. If he does he will need  IV antibiotics and hyperbaric oxygen. If not a possible referral  to plastic surgery. We have been using silver alginate on this wound. He arrives today with his sister 5/7; the patient was admitted to the [place] in [place] apparently predominantly for psychiatric issues last week. He was inpatient for 4 days. Somebody called our clinic to report that the MRI was repeated in [place] which was negative for osteomyelitis. They are using Iodosorb to the wound surface which suggests to me that they did not identify the undermining area from 6-12 o'clock through the non-adhered tissue in the middle of this large oval wound 5/14; the patient has a difficult wound over the right greater trochanter. Recent MRI done at the [place] in [place] was negative for osteomyelitis. The wound itself is mostly superficial however there is a linear divot that allows a deep probing. We have been using Iodoflex 5/28 2-week follow-up. Since he was last here the patient has been fired by [facility] home health apparently was belligerent to a staff member. He has been referred to [facility] by his primary doctor however they refused to pick him up for reasons that are unclear at this point. We have been using Iodoflex in an attempt to promote adherence of the underlying granulation. 6/4; patient has been using Iodoflex with some reduction in the dimensions of his surface wound. Nevertheless he still has the same slit with the same degree of undermining especially superiorly this is not come in at all. Does not appear to be any active infection. He has been approved for a snap VAC 6/11; he arrives with some reduction in the surface area but not necessarily the volume in the divot area. I said he was approved for the snap VAC last week but apparently the [place] also needs to register and approval at some some level. 6/18; wound looks somewhat improved today. We have been using Iodoflex. We apparently still do not have the snap VAC  approved through the [place] according to [facility]. 6/25; wounds look somewhat improved today. We have been using Iodoflex and making steady progress in the surface area. The tunneling depth is now at the superior part of the wound edge. The depth of this is about the same however the undermining area superiorly and inferiorly is improved. We still do not have a VAC 7/9 2-week follow-up. The overall surface area the wound continues to contract we have been using Iodoflex. The undermining tunnel is at the superior part of the wound edge. This still probes inferiorly and superiorly. Unfortunately we still have not heard from the [place] with regards to the wound VAC in spite of intense efforts of our staff over the last month. 7/23; 2-week follow-up. There continues to be improvement here. He is using Iodoflex. He does not have home health. We were not successful in getting the wound VAC through the [place]. He appears to be running low on supplies of the Iodoflex and there really is not any way to easily do this through the [place]. They may need to order this online on [place] 8/6-Patient is here for 2-week follow-up, the wound appears the same size as before, patient has not ordered anything from [place] so far, and he is running low on Iodoflex at home. 8/20. Patient was changed to silver alginate last time and really the wound is a lot better. Smaller. There was no identifiable divot with undermining in this area and the lip here appears to have closed over. There is no infection. His sister is changing the dressing 9/3; silver alginate. Wound is smaller small divot  on the superior rim of this but this appears to be clean. He has some form of contact dermatitis well away from the wound area which is either ABDs over the tape. We told the sister that this does not need to extend to this area in terms of the secondary dressing 9/17; silver alginate. Small wound remaining. Substantial scar tissue but everything else looks  well 10/1; using silver alginate. Small remaining wound which has improved surface area. Eschar around the wound the surface of which is dry 10/15 2-week follow-up. I changed him to silver collagen last time I really felt the small area might be closed however once again he has eschar around the surface which is dry and requires debridement. His sister is changing his dressing although she is going to the hospital for a knee replacement they apparently have a friend who will do the dressing Objective Constitutional Sitting or standing Blood Pressure is within target range for patient.. Pulse regular and within target range for patient.Marland Kitchen. Respirations regular, non-labored and within target range.. Temperature is normal and within the target range for the patient.Marland Kitchen. Appears in no distress. Vitals Time Taken: 1:01 Singleton, Height: 73 in, Weight: 150 lbs, BMI: 19.8, Temperature: 98.6 F, Pulse: 75 bpm, Respiratory Rate: 18 breaths/min, Blood Pressure: 116/77 mmHg. General Notes: Wound exam; right greater trochanter. Very small wound but with dry scaled edges and debris. I used a #3 curette to debride the edges as well as the wound surface this cleans up quite nicely. There is nothing but skin over bone in this area however. No evidence of infection Integumentary (Hair, Skin) Wound #1 status is Open. Original cause of wound was Pressure Injury. The wound is located on the Right Trochanter. The wound measures 0.5cm length x 0.2cm width x 0.1cm depth; 0.079cm^2 area and 0.008cm^3 volume. There is Fat Layer (Subcutaneous Tissue) Exposed exposed. There is no tunneling or undermining noted. There is a small amount of serosanguineous drainage noted. The wound margin is flat and intact. There is no granulation within the wound bed. There is a large (67-100%) amount of necrotic tissue within the wound bed including Adherent Slough. Assessment Active Problems ICD-10 Pressure ulcer of right hip, stage  4 Procedures Wound #1 Pre-procedure diagnosis of Wound #1 is a Pressure Ulcer located on the Right Trochanter . There was a Excisional Skin/Subcutaneous Tissue Debridement with a total area of 0.1 sq cm performed by Maxwell Caulobson,  G., MD. With the following instrument(s): Curette to remove Viable and Non-Viable tissue/material. Material removed includes Subcutaneous Tissue and Slough and. No specimens were taken. A time out was conducted at 13:07, prior to the start of the procedure. A Minimum amount of bleeding was controlled with Pressure. The procedure was tolerated well with a pain level of 0 throughout and a pain level of 0 following the procedure. Post Debridement Measurements: 0.5cm length x 0.2cm width x 0.1cm depth; 0.008cm^3 volume. Post debridement Stage noted as Category/Stage IV. Character of Wound/Ulcer Post Debridement is improved. Post procedure Diagnosis Wound #1: Same as Pre-Procedure Plan Follow-up Appointments: Return appointment in 3 weeks. Dressing Change Frequency: Wound #1 Right Trochanter: Other: - Twice a week Skin Barriers/Peri-Wound Care: Wound #1 Right Trochanter: Skin Prep Barrier cream - as needed for skin irritation. Wound Cleansing: Wound #1 Right Trochanter: Clean wound with Wound Cleanser - anasept or vashe Primary Wound Dressing: Wound #1 Right Trochanter: Silver Collagen - moisten with hydrogel/ KY jelly. Secondary Dressing: Wound #1 Right Trochanter: Dry Gauze - secure with tape Off-Loading:  Turn and reposition every 2 hours - avoid laying of right hip. no pressure on right hip. 1. I am continue with the silver collagen/dry gauze 2. Caution pressure relief 3. The wound is superficial. Looks like it should epithelial Electronic Signature(s) Signed: 12/01/2018 5:35:18 Singleton By: Baltazar Najjar MD Entered By: Baltazar Najjar on 12/01/2018 13:15:02 -------------------------------------------------------------------------------- SuperBill  Details Patient Name: Date of Service: Martin Singleton 12/01/2018 Medical Record [id number] Patient Account Number: [id number] Date of Birth/Sex: Treating RN: 07-05-1955 (63 y.o. 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, stage 4 Facility Procedures The patient participates with Medicare or their insurance follows the Medicare Facility Guidelines: CPT4 Code Description Modifier Quantity [id number] 11042 - DEB SUBQ TISSUE 20 SQ CM/< 1 ICD-10 Diagnosis Description L89.214 Pressure ulcer of right hip,  stage 4 Physician Procedures CPT4 Code: 4696295 Description: 11042 - WC PHYS SUBQ TISS 20 SQ CM ICD-10 Diagnosis Description 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

## 2018-12-22 ENCOUNTER — Encounter (HOSPITAL_BASED_OUTPATIENT_CLINIC_OR_DEPARTMENT_OTHER): Payer: Medicare Other | Attending: Internal Medicine | Admitting: Internal Medicine

## 2018-12-22 DIAGNOSIS — Z87891 Personal history of nicotine dependence: Secondary | ICD-10-CM | POA: Diagnosis not present

## 2018-12-22 DIAGNOSIS — F329 Major depressive disorder, single episode, unspecified: Secondary | ICD-10-CM | POA: Diagnosis not present

## 2018-12-22 DIAGNOSIS — G894 Chronic pain syndrome: Secondary | ICD-10-CM | POA: Insufficient documentation

## 2018-12-22 DIAGNOSIS — F4310 Post-traumatic stress disorder, unspecified: Secondary | ICD-10-CM | POA: Insufficient documentation

## 2018-12-22 DIAGNOSIS — K2100 Gastro-esophageal reflux disease with esophagitis, without bleeding: Secondary | ICD-10-CM | POA: Diagnosis not present

## 2018-12-22 DIAGNOSIS — F419 Anxiety disorder, unspecified: Secondary | ICD-10-CM | POA: Insufficient documentation

## 2018-12-22 DIAGNOSIS — N3281 Overactive bladder: Secondary | ICD-10-CM | POA: Insufficient documentation

## 2018-12-22 DIAGNOSIS — E785 Hyperlipidemia, unspecified: Secondary | ICD-10-CM | POA: Diagnosis not present

## 2018-12-22 DIAGNOSIS — L89214 Pressure ulcer of right hip, stage 4: Secondary | ICD-10-CM | POA: Diagnosis present

## 2018-12-22 NOTE — Progress Notes (Signed)
Martin Singleton, Martin Singleton ([id number]) Visit Report for 12/22/2018 Arrival Information Details Patient Name: Date of Service: Martin Singleton, Martin Singleton 12/22/2018 12:30 PM Medical Record [id number] Patient Account Number: [id number] Date of Birth/Sex: Treating RN: 23-Aug-1955 (63 y.o. Katherina Right Primary Care Emitt Maglione: PATIENT, NO Other Clinician: Referring Merve Hotard: Treating Devaunte Gasparini/Extender:Robson, Lamar Sprinkles in Treatment: 28 Visit Information History Since Last Visit Cane Added or deleted any medications: No Patient Arrived: 13:10 Any new allergies or adverse reactions: No Arrival Time: Had a fall or experienced change in No Accompanied By: self None activities of daily living that may affect Transfer Assistance: risk of falls: Patient Identification Verified: Yes Signs or symptoms of abuse/neglect since last No Secondary Verification Process Completed: Yes visito Patient Requires Transmission-Based No Hospitalized since last visit: No Precautions: Implantable device outside of the clinic excluding No Patient Has Alerts: No cellular tissue based products placed in the center since last visit: Has Dressing in Place as Prescribed: Yes Pain Present Now: No Electronic Signature(s) Signed: 12/22/2018 5:16:49 PM By: Cherylin Mylar Entered By: Cherylin Mylar on 12/22/2018 13:11:13 -------------------------------------------------------------------------------- Clinic Level of Care Assessment Details Patient Name: Date of Service: Martin Singleton, Martin Singleton 12/22/2018 12:30 PM Medical Record [id number] Patient Account Number: [id number] Date of Birth/Sex: Treating RN: [birth year] (63 y.o. Katherina Right Primary Care Veneda Kirksey: PATIENT, NO Other Clinician: Referring Kanasia Gayman: Treating Myka Hitz/Extender:Robson, Lamar Sprinkles in Treatment: 28 Clinic Level of Care Assessment Items TOOL 4 Quantity Score X - Use when only an EandM is performed on FOLLOW-UP visit 1  0 ASSESSMENTS - Nursing Assessment / Reassessment X - Reassessment of Co-morbidities (includes updates in patient status) 1 10 X - Reassessment of Adherence to Treatment Plan 1 5 ASSESSMENTS - Wound and Skin Assessment / Reassessment X - Simple Wound Assessment / Reassessment - one wound 1 5 []  - Complex Wound Assessment / Reassessment - multiple wounds 0 []  - Dermatologic / Skin Assessment (not related to wound area) 0 ASSESSMENTS - Focused Assessment []  - Circumferential Edema Measurements - multi extremities 0 []  - Nutritional Assessment / Counseling / Intervention 0 []  - Lower Extremity Assessment (monofilament, tuning fork, pulses) 0 []  - Peripheral Arterial Disease Assessment (using hand held doppler) 0 ASSESSMENTS - Ostomy and/or Continence Assessment and Care []  - Incontinence Assessment and Management 0 []  - Ostomy Care Assessment and Management (repouching, etc.) 0 PROCESS - Coordination of Care X - Simple Patient / Family Education for ongoing care 1 15 []  - Complex (extensive) Patient / Family Education for ongoing care 0 []  - Staff obtains , Records, Test Results / Process Orders 0 []  - Staff telephones HHA, Nursing Homes / Clarify orders / etc 0 []  - Routine Transfer to another Facility (non-emergent condition) 0 []  - Routine Hospital Admission (non-emergent condition) 0 []  - New Admissions / / Ordering NPWT, Apligraf, etc. 0 []  - Emergency Hospital Admission (emergent condition) 0 X - Simple Discharge Coordination 1 10 []  - Complex (extensive) Discharge Coordination 0 PROCESS - Special Needs []  - Pediatric / Minor Patient Management 0 []  - Isolation Patient Management 0 []  - Hearing / Language / Visual special needs 0 []  - Assessment of Community assistance (transportation, D/C planning, etc.) 0 []  - Additional assistance / Altered mentation 0 []  - Support Surface(s) Assessment (bed, cushion, seat, etc.) 0 INTERVENTIONS - Wound  Cleansing / Measurement []  - Simple Wound Cleansing - one wound 0 []  - Complex Wound Cleansing - multiple wounds 0 X - Wound Imaging (photographs - any number of wounds) 1 5 []  -  Wound Tracing (instead of photographs) 0 []  - Simple Wound Measurement - one wound 0 []  - Complex Wound Measurement - multiple wounds 0 INTERVENTIONS - Wound Dressings []  - Small Wound Dressing one or multiple wounds 0 []  - Medium Wound Dressing one or multiple wounds 0 []  - Large Wound Dressing one or multiple wounds 0 []  - Application of Medications - topical 0 []  - Application of Medications - injection 0 INTERVENTIONS - Miscellaneous []  - External ear exam 0 []  - Specimen Collection (cultures, biopsies, blood, body fluids, etc.) 0 []  - Specimen(s) / Culture(s) sent or taken to Lab for analysis 0 []  - Patient Transfer (multiple staff / Nurse, adultHoyer Lift / Similar devices) 0 []  - Simple Staple / Suture removal (25 or less) 0 []  - Complex Staple / Suture removal (26 or more) 0 []  - Hypo / Hyperglycemic Management (close monitor of Blood Glucose) 0 []  - Ankle / Brachial Index (ABI) - do not check if billed separately 0 X - Vital Signs 1 5 Has the patient been seen at the hospital within the last three years: Yes Total Score: 55 Level Of Care: New/Established - Level 2 Electronic Signature(s) Signed: 12/22/2018 5:16:49 PM By: Cherylin Mylarwiggins, Shannon Entered By: Cherylin Mylarwiggins, Shannon on 12/22/2018 13:40:39 -------------------------------------------------------------------------------- Encounter Discharge Information Details Patient Name: Date of Service: Martin Singleton, Martin A. 12/22/2018 12:30 PM Medical Record [id number] Patient Account Number: [id number] Date of Birth/Sex: Treating RN: 06/08/1955 (63 y.o. Tammy SoursM) Martin Singleton, Martin Singleton Primary Care Sonny Poth: PATIENT, NO Other Clinician: Referring Kale Dols: Treating Jaheem Hedgepath/Extender:Robson, Lamar SprinklesMichael Weeks in Treatment: 28 Encounter Discharge Information Items Discharge Condition:  Stable Ambulatory Status: Cane Discharge Destination: Home Transportation: Private Auto Accompanied By: self Schedule Follow-up Appointment: No Clinical Summary of Care: Electronic Signature(s) Signed: 12/22/2018 5:30:28 PM By: Martin Singleton, Martin Singleton Entered By: Martin Singleton, Martin Singleton on 12/22/2018 14:17:28 -------------------------------------------------------------------------------- Lower Extremity Assessment Details Patient Name: Date of Service: Martin Singleton, Martin A. 12/22/2018 12:30 PM Medical Record [id number] Patient Account Number: [id number] Date of Birth/Sex: Treating RN: 04/06/1955 (63 y.o. Katherina RightM) Dwiggins, Shannon Primary Care Jayen Bromwell: PATIENT, NO Other Clinician: Referring Halaina Vanduzer: Treating Jashaun Penrose/Extender:Robson, Lamar SprinklesMichael Weeks in Treatment: 28 Electronic Signature(s) Signed: 12/22/2018 5:16:49 PM By: Cherylin Mylarwiggins, Shannon Entered By: Cherylin Mylarwiggins, Shannon on 12/22/2018 13:11:52 -------------------------------------------------------------------------------- Multi Wound Chart Details Patient Name: Date of Service: Martin Singleton, Martin A. 12/22/2018 12:30 PM Medical Record [id number] Patient Account Number: [id number] Date of Birth/Sex: Treating RN: 09/20/1955 (63 y.o. Harlon FlorM) Martin Singleton, Millard.LoaBobbi Primary Care Bailey Kolbe: PATIENT, NO Other Clinician: Referring Treyvonne Tata: Treating Chrysten Woulfe/Extender:Robson, Lamar SprinklesMichael Weeks in Treatment: 28 Vital Signs Height(in): 73 Pulse(bpm): 75 Weight(lbs): 150 Blood Pressure(mmHg): 128/76 Body Mass Index(BMI): 20 Temperature(F): 98.2 Respiratory 18 Rate(breaths/min): Photos: [1:No Photos] [N/A:N/A] Wound Location: [1:Right Trochanter] [N/A:N/A] Wounding Event: [1:Pressure Injury] [N/A:N/A] Primary Etiology: [1:Pressure Ulcer] [N/A:N/A] Date Acquired: [1:12/17/2017] [N/A:N/A] Weeks of Treatment: [1:28] [N/A:N/A] Wound Status: [1:Open] [N/A:N/A] Measurements L x W x D [1:0x0x0] [N/A:N/A] (cm) Area (cm) : [1:0] [N/A:N/A] Volume (cm) : [1:0] [N/A:N/A] % Reduction  in Area: [1:100.00%] [N/A:N/A] % Reduction in Volume: [1:100.00%] [N/A:N/A] Classification: [1:Category/Stage IV] [N/A:N/A] Exudate Amount: [1:None Present] [N/A:N/A] Wound Margin: [1:Flat and Intact] [N/A:N/A] Granulation Amount: [1:None Present (0%)] [N/A:N/A] Necrotic Amount: [1:None Present (0%)] [N/A:N/A] Exposed Structures: [1:Fascia: No Fat Layer (Subcutaneous Tissue) Exposed: No Tendon: No Muscle: No Joint: No Bone: No Large (67-100%)] [N/A:N/A N/A] Treatment Notes Electronic Signature(s) Signed: 12/22/2018 5:30:28 PM By: Martin Singleton, Martin Singleton Signed: 12/22/2018 5:45:12 PM By: Martin Singleton, Michael MD Entered By: Martin Singleton, Martin Singleton on 12/22/2018 13:49:49 -------------------------------------------------------------------------------- Multi-Disciplinary Care Plan Details Patient Name: Date of Service: Martin Singleton, Martin A. 12/22/2018 12:30 PM  Medical Record [id number] Patient Account Number: [id number] Date of Birth/Sex: Treating RN: [birth year] (63 y.o. Marvis Repress Primary Care Kiegan Macaraeg: PATIENT, NO Other Clinician: Referring Rodneisha Bonnet: Treating Rollie Hynek/Extender:Robson, Esperanza Richters in Treatment: 28 Active Inactive Electronic Signature(s) Signed: 12/22/2018 5:16:49 PM By: Kela Millin Entered By: Kela Millin on 12/22/2018 13:39:35 -------------------------------------------------------------------------------- Pain Assessment Details Patient Name: Date of Service: Martin Singleton 12/22/2018 12:30 PM Medical Record [id number] Patient Account Number: [id number] Date of Birth/Sex: Treating RN: 1956/02/03 (63 y.o. Marvis Repress Primary Care Araly Kaas: PATIENT, NO Other Clinician: Referring Jackelyn Illingworth: Treating Erica Osuna/Extender:Robson, Esperanza Richters in Treatment: 28 Active Problems Location of Pain Severity and Description of Pain Patient Has Paino No Site Locations Pain Management and Medication Current Pain Management: Electronic Signature(s) Signed:  12/22/2018 5:16:49 PM By: Kela Millin Entered By: Kela Millin on 12/22/2018 13:11:45 -------------------------------------------------------------------------------- Patient/Caregiver Education Details Patient Name: Date of Service: Martin Singleton 11/5/2020andnbsp12:30 PM Medical Record [id number] Patient Account Number: [id number] Date of Birth/Gender: Treating RN: [birth year] (63 y.o. Hessie Diener Primary Care Physician: PATIENT, NO Other Clinician: Referring Physician: Treating Physician/Extender:Robson, Esperanza Richters in Treatment: 28 Education Assessment Education Provided To: Patient Education Topics Provided Pressure: Methods: Explain/Verbal Responses: Reinforcements needed Electronic Signature(s) Signed: 12/22/2018 5:30:28 PM By: Deon Pilling Entered By: Deon Pilling on 12/22/2018 13:28:57 -------------------------------------------------------------------------------- Wound Assessment Details Patient Name: Date of Service: Martin Singleton 12/22/2018 12:30 PM Medical Record [id number] Patient Account Number: [id number] Date of Birth/Sex: Treating RN: 1955/03/20 (63 y.o. Marvis Repress Primary Care Amye Grego: PATIENT, NO Other Clinician: Referring Azya Barbero: Treating Keyen Marban/Extender:Robson, Esperanza Richters in Treatment: 28 Wound Status Wound Number: 1 Primary Etiology: Pressure Ulcer Wound Location: Right Trochanter Wound Status: Open Wounding Event: Pressure Injury Date Acquired: 12/17/2017 Weeks Of Treatment: 28 Clustered Wound: No Wound Measurements Length: (cm) 0 % Reduct Width: (cm) 0 % Reduct Depth: (cm) 0 Epitheli Area: (cm) 0 Tunneli Volume: (cm) 0 Undermi Wound Description Classification: Category/Stage IV Foul Odor Wound Margin: Flat and Intact Slough/Fib Exudate Amount: None Present Wound Bed Granulation Amount: None Present (0%) Necrotic Amount: None Present (0%) Fascia Expo Fat Layer ( Tendon Expo Muscle  Expo Joint Expos Bone Expose After Cleansing: No rino No Exposed Structure sed: No Subcutaneous Tissue) Exposed: No sed: No sed: No ed: No d: No ion in Area: 100% ion in Volume: 100% alization: Large (67-100%) ng: No ning: No Electronic Signature(s) Signed: 12/22/2018 5:16:49 PM By: Kela Millin Entered By: Kela Millin on 12/22/2018 13:15:08 -------------------------------------------------------------------------------- Vitals Details Patient Name: Date of Service: Martin Singleton. 12/22/2018 12:30 PM Medical Record [id number] Patient Account Number: [id number] Date of Birth/Sex: Treating RN: Jan 21, 1956 (63 y.o. Marvis Repress Primary Care Marcha Licklider: PATIENT, NO Other Clinician: Referring Johnanthony Wilden: Treating Allante Beane/Extender:Robson, Esperanza Richters in Treatment: 28 Vital Signs Time Taken: 13:11 Temperature (F): 98.2 Height (in): 73 Pulse (bpm): 75 Weight (lbs): 150 Respiratory Rate (breaths/min): 18 Body Mass Index (BMI): 19.8 Blood Pressure (mmHg): 128/76 Reference Range: 80 - 120 mg / dl Electronic Signature(s) Signed: 12/22/2018 5:16:49 PM By: Kela Millin Entered By: Kela Millin on 12/22/2018 13:11:38

## 2018-12-22 NOTE — Progress Notes (Signed)
Martin Singleton, Martin A. ([id number]) Visit Report for 12/22/2018 HPI Details Patient Name: Date of Service: Martin Singleton, Martin A. 12/22/2018 12:30 PM Medical Record [id number] Patient Account Number: [id number] Date of Birth/Sex: Treating RN: 09/19/1955 (63 y.o. Tammy SoursM) Deaton, Bobbi Primary Care Provider: PATIENT, NO Other Clinician: Referring Provider: Treating Provider/Extender:, Lamar SprinklesMichael Weeks in Treatment: 28 History of Present Illness HPI Description: ADMISSION 06/03/2018 This is a 63 year old man who apparently was found down on the floor for an extended period of time in August 2019. [facility]. He was discharged to a behavioral health unit for depression/PTSD. He was noted at some point after this to have a pressure ulcer over the right greater trochanter. He had an MRI done on 12/23/2017 that suggested an abscess and possibly a small focus of acute osteomyelitis. The information in care everywhere is a bit difficult to follow however he was admitted to hospital in December had an operative debridement. I am able to look through the records and very clearly he had a wound VAC on for a period of time although I do not exactly have the duration. The patient states he was treated for MRSA with a prolonged course of IV vancomycin although I do not exactly see the documentation of this at the moment. In fact I cannot see that this was cultured. I can see that at one point he was on daptomycin. He states he had 3 MRIs last one on 04/22/2018. This showed a large area of inflammatory tissue and skin ulceration lateral to the right greater trochanter measuring 6.2 cm. There was noes discrete abscess no discrete osteomyelitis of the greater trochanter. The patient was discharged from hospital/psychiatric care 2 weeks ago and he is now living on his own in [place]. His primary physician is Dr. Windle GuardWilson Elkins who he is seen on 2 occasions. He had [facility] health  [facility] and apparently a putting silver alginate although our intake nurse says that he did not have silver alginate on this on arrival today. By the patient's description he is functioning marginally. He is not taking any of his medications even the psych medication he was supposed to be on for depression [apparently Zoloft]. He still admits to being significantly depressed. Past medical history; anxiety disorder chronic pain syndrome, esophageal reflux, major depression, hyperlipidemia, overactive bladder, cervical radiculopathy, sensorineural hearing loss bilaterally, the patient no longer smokes. He is not a diabetic. 4/23; admitted to the clinic last week. Swab culture I did of this area last time was negative. He is using silver alginate. In reviewing his history I think the patient will need a follow-up MRI from the 1 last done on 04/22/2018. The big issue here is does he have underlying osteomyelitis. If he does he will need IV antibiotics and hyperbaric oxygen. If not a possible referral to plastic surgery. We have been using silver alginate on this wound. He arrives today with his sister 5/7; the [facility] [place] for psychiatric issues last week. He was inpatient for 4 days. Somebody called our clinic to report that the MRI was repeated in [place] which was negative for osteomyelitis. They are using Iodosorb to the wound surface which suggests to me that they did not identify the undermining area from 6-12 o'clock through the non-adhered tissue in the middle of this large oval wound 5/14; the patient has a difficult wound over the right greater trochanter. Recent [facility] [place]  was negative for osteomyelitis. The wound itself is mostly superficial however there is a linear divot that allows a deep probing. We have been using Iodoflex 5/28 2-week follow-up. Since he was last here the patient has  been fired by [facility] home health apparently was belligerent to a staff member. He has been referred to [facility] by his primary doctor however they refused to pick him up for reasons that are unclear at this point. We have been using Iodoflex in an attempt to promote adherence of the underlying granulation. 6/4; patient has been using Iodoflex with some reduction in the dimensions of his surface wound. Nevertheless he still has the same slit with the same degree of undermining especially superiorly this is not come in at all. Does not appear to be any active infection. He has been approved for a snap VAC 6/11; he arrives with some reduction in the surface area but not necessarily the volume in the divot area. I said he was approved for the snap VAC last week but apparently the [place] also needs to register and approval at some some level. 6/18; wound looks somewhat improved today. We have been using Iodoflex. We apparently still do not have the snap VAC approved through the [place] according to [facility]. 6/25; wounds look somewhat improved today. We have been using Iodoflex and making steady progress in the surface area. The tunneling depth is now at the superior part of the wound edge. The depth of this is about the same however the undermining area superiorly and inferiorly is improved. We still do not have a VAC 7/9 2-week follow-up. The overall surface area the wound continues to contract we have been using Iodoflex. The undermining tunnel is at the superior part of the wound edge. This still probes inferiorly and superiorly. Unfortunately we still have not heard from the [place] with regards to the wound VAC in spite of intense efforts of our staff over the last month. 7/23; 2-week follow-up. There continues to be improvement here. He is using Iodoflex. He does not have home health. We were not successful in getting the wound VAC through the [place]. He appears to be running low on supplies of the Iodoflex and  there really is not any way to easily do this through the [place]. They may need to order this online on [place] 8/6-Patient is here for 2-week follow-up, the wound appears the same size as before, patient has not ordered anything from [place] so far, and he is running low on Iodoflex at home. 8/20. Patient was changed to silver alginate last time and really the wound is a lot better. Smaller. There was no identifiable divot with undermining in this area and the lip here appears to have closed over. There is no infection. His sister is changing the dressing 9/3; silver alginate. Wound is smaller small divot on the superior rim of this but this appears to be clean. He has some form of contact dermatitis well away from the wound area which is either ABDs over the tape. We told the sister that this does not need to extend to this area in terms of the secondary dressing 9/17; silver alginate. Small wound remaining. Substantial scar tissue but everything else looks well 10/1; using silver alginate. Small remaining wound which has improved surface area. Eschar around the wound the surface of which is dry 10/15 2-week follow-up. I changed him to silver collagen last time I really felt the small area might be closed however once again he has  eschar around the surface which is dry and requires debridement. His sister is changing his dressing although she is going to the hospital for a knee replacement they apparently have a friend who will do the dressing 11/5; the patient's wound is healed. Substantial surgical scar but everything else looks completely epithelialized and normal. This was sent originally a pressure ulcer after being found down for a substantial period of time on the full floor. At an outside hospital I believe he was treated for osteomyelitis. A repeat MRI done here did not show osteomyelitis however. This was a substantial deep wound on presentation. It is gradually filled then. At one point  I thought he might require plastic surgery Electronic Signature(s) Signed: 12/22/2018 5:45:12 PM By: [name] Ham MD Entered By: [name] Ham on 12/22/2018 13:52:28 -------------------------------------------------------------------------------- Physical Exam Details Patient Name: Date of Service: Martin Singleton 12/22/2018 12:30 PM Medical Record [id number] Patient Account Number: [id number] Date of Birth/Sex: Treating RN: 12-09-1955 (62 y.o. Hessie Diener Primary Care Provider: PATIENT, NO Other Clinician: Referring Provider: Treating Provider/Extender:, Esperanza Richters in Treatment: 28 Constitutional Sitting or standing Blood Pressure is within target range for patient.. Pulse regular and within target range for patient.Marland Kitchen Respirations regular, non-labored and within target range.. Temperature is normal and within the target range for the patient.Marland Kitchen Appears in no distress. Notes Wound exam; right greater trochanter. The area is completely epithelialized. This looks healthy. He is his original surgical scar but everything else is closed. There is no surrounding erythema Electronic Signature(s) Signed: 12/22/2018 5:45:12 PM By: [name] Ham MD Entered By: [name] Ham on 12/22/2018 13:53:00 -------------------------------------------------------------------------------- Physician Orders Details Patient Name: Date of Service: Martin Singleton. 12/22/2018 12:30 PM Medical Record [id number] Patient Account Number: [id number] Date of Birth/Sex: Treating RN: 30-Dec-1955 (63 y.o. 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 Services Discharge from [place] - call if wound re-opens or new wound  develops Electronic Signature(s) Signed: 12/22/2018 5:16:49 PM By: Kela Millin Signed: 12/22/2018 5:45:12 PM By: [name] Ham MD Entered By: Kela Millin on 12/22/2018 13:41:40 -------------------------------------------------------------------------------- Problem List Details Patient Name: Date of Service: Martin Singleton. 12/22/2018 12:30 PM Medical Record [id number] Patient Account Number: [id number] Date of Birth/Sex: Treating RN: [birth year] (63 y.o. 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, stage 4 06/03/2018 No Yes Inactive Problems Resolved Problems ICD-10 Code Description Active Date Resolved Date A49.02 Methicillin resistant Staphylococcus aureus infection, 06/03/2018 06/03/2018 unspecified site Electronic Signature(s) Signed: 12/22/2018 5:45:12 PM By: [name] Ham MD Entered By: [name] Ham on 12/22/2018 13:49:43 -------------------------------------------------------------------------------- Progress Note Details Patient Name: Date of Service: Martin Singleton 12/22/2018 12:30 PM Medical Record [id number] Patient Account Number: [id number] Date of Birth/Sex: Treating RN: 04-30-1955 (63 y.o. Hessie Diener Primary Care Provider: PATIENT, NO Other Clinician: Referring Provider: Treating Provider/Extender:, Esperanza Richters in Treatment: 28 Subjective History of Present Illness (HPI) ADMISSION 06/03/2018 This is a 63 year old man who apparently was found down on the floor for an extended period of time in August 2019. His care was at [facility]. He was discharged to a behavioral health unit for depression/PTSD. He was noted at some point after this  to have a pressure ulcer over  the right greater trochanter. He had an MRI done on 12/23/2017 that suggested an abscess and possibly a small focus of acute osteomyelitis. The information in care everywhere is a bit difficult to follow however he was admitted to hospital in December had an operative debridement. I am able to look through the records and very clearly he had a wound VAC on for a period of time although I do not exactly have the duration. The patient states he was treated for MRSA with a prolonged course of IV vancomycin although I do not exactly see the documentation of this at the moment. In fact I cannot see that this was cultured. I can see that at one point he was on daptomycin. He states he had 3 MRIs last one on 04/22/2018. This showed a large area of inflammatory tissue and skin ulceration lateral to the right greater trochanter measuring 6.2 cm. There was noes discrete abscess no discrete osteomyelitis of the greater trochanter. The patient was discharged from hospital/psychiatric care 2 weeks ago and he is now living on his own in [place]. His primary physician is Dr. Windle Guard who he is seen on 2 occasions. He had home health arranged through [facility] and apparently a putting silver alginate although our intake nurse says that he did not have silver alginate on this on arrival today. By the patient's description he is functioning marginally. He is not taking any of his medications even the psych medication he was supposed to be on for depression [apparently Zoloft]. He still admits to being significantly depressed. Past medical history; anxiety disorder chronic pain syndrome, esophageal reflux, major depression, hyperlipidemia, overactive bladder, cervical radiculopathy, sensorineural hearing loss bilaterally, the patient no longer smokes. He is not a diabetic. 4/23; admitted to the clinic last week. Swab culture I did of this area last time was negative. He is using silver alginate. In  reviewing his history I think the patient will need a follow-up MRI from the 1 last done on 04/22/2018. The big issue here is does he have underlying osteomyelitis. If he does he will need IV antibiotics and hyperbaric oxygen. If not a possible referral to plastic surgery. We have been using silver alginate on this wound. He arrives today with his sister 5/7; the patient was admitted to the [place] in [place] apparently predominantly for psychiatric issues last week. He was inpatient for 4 days. Somebody called our clinic to report that the MRI was repeated in [place] which was negative for osteomyelitis. They are using Iodosorb to the wound surface which suggests to me that they did not identify the undermining area from 6-12 o'clock through the non-adhered tissue in the middle of this large oval wound 5/14; the patient has a difficult wound over the right greater trochanter. Recent MRI done at the [place] in [place] was negative for osteomyelitis. The wound itself is mostly superficial however there is a linear divot that allows a deep probing. We have been using Iodoflex 5/28 2-week follow-up. Since he was last here the patient has been fired by [facility] home health apparently was belligerent to a staff member. He has been referred to [facility] by his primary doctor however they refused to pick him up for reasons that are unclear at this point. We have been using Iodoflex in an attempt to promote adherence of the underlying granulation. 6/4; patient has been using Iodoflex with some reduction in the dimensions of his surface wound. Nevertheless he still has  the same slit with the same degree of undermining especially superiorly this is not come in at all. Does not appear to be any active infection. He has been approved for a snap VAC 6/11; he arrives with some reduction in the surface area but not necessarily the volume in the divot area. I said he was approved for the snap VAC last week but  apparently the [place] also needs to register and approval at some some level. 6/18; wound looks somewhat improved today. We have been using Iodoflex. We apparently still do not have the snap VAC approved through the [place] according to [facility]. 6/25; wounds look somewhat improved today. We have been using Iodoflex and making steady progress in the surface area. The tunneling depth is now at the superior part of the wound edge. The depth of this is about the same however the undermining area superiorly and inferiorly is improved. We still do not have a VAC 7/9 2-week follow-up. The overall surface area the wound continues to contract we have been using Iodoflex. The undermining tunnel is at the superior part of the wound edge. This still probes inferiorly and superiorly. Unfortunately we still have not heard from the [place] with regards to the wound VAC in spite of intense efforts of our staff over the last month. 7/23; 2-week follow-up. There continues to be improvement here. He is using Iodoflex. He does not have home health. We were not successful in getting the wound VAC through the [place]. He appears to be running low on supplies of the Iodoflex and there really is not any way to easily do this through the [place]. They may need to order this online on [place] 8/6-Patient is here for 2-week follow-up, the wound appears the same size as before, patient has not ordered anything from [place] so far, and he is running low on Iodoflex at home. 8/20. Patient was changed to silver alginate last time and really the wound is a lot better. Smaller. There was no identifiable divot with undermining in this area and the lip here appears to have closed over. There is no infection. His sister is changing the dressing 9/3; silver alginate. Wound is smaller small divot on the superior rim of this but this appears to be clean. He has some form of contact dermatitis well away from the wound area which is either ABDs over the tape. We  told the sister that this does not need to extend to this area in terms of the secondary dressing 9/17; silver alginate. Small wound remaining. Substantial scar tissue but everything else looks well 10/1; using silver alginate. Small remaining wound which has improved surface area. Eschar around the wound the surface of which is dry 10/15 2-week follow-up. I changed him to silver collagen last time I really felt the small area might be closed however once again he has eschar around the surface which is dry and requires debridement. His sister is changing his dressing although she is going to the hospital for a knee replacement they apparently have a friend who will do the dressing 11/5; the patient's wound is healed. Substantial surgical scar but everything else looks completely epithelialized and normal. This was sent originally a pressure ulcer after being found down for a substantial period of time on the full floor. At an outside hospital I believe he was treated for osteomyelitis. A repeat MRI done here did not show osteomyelitis however. This was a substantial deep wound on presentation. It is gradually filled then. At  one point I thought he might require plastic surgery Objective Constitutional Sitting or standing Blood Pressure is within target range for patient.. Pulse regular and within target range for patient.Marland Kitchen Respirations regular, non-labored and within target range.. Temperature is normal and within the target range for the patient.Marland Kitchen Appears in no distress. Vitals Time Taken: 1:11 PM, Height: 73 in, Weight: 150 lbs, BMI: 19.8, Temperature: 98.2 F, Pulse: 75 bpm, Respiratory Rate: 18 breaths/min, Blood Pressure: 128/76 mmHg. General Notes: Wound exam; right greater trochanter. The area is completely epithelialized. This looks healthy. He is his original surgical scar but everything else is closed. There is no surrounding erythema Integumentary (Hair, Skin) Wound #1 status is  Open. Original cause of wound was Pressure Injury. The wound is located on the Right Trochanter. The wound measures 0cm length x 0cm width x 0cm depth; 0cm^2 area and 0cm^3 volume. There is no tunneling or undermining noted. There is a none present amount of drainage noted. The wound margin is flat and intact. There is no granulation within the wound bed. There is no necrotic tissue within the wound bed. Assessment Active Problems ICD-10 Pressure ulcer of right hip, stage 4 Plan Discharge From [facility] Services: Discharge from Wound Care Center - call if wound re-opens or new wound develops 1. The patient can be discharged from the wound center. 2. I advised him to keep the pressure relief off this for perhaps another month. There may be a virtual space here however I can think of nothing else to do except watch this over time Electronic Signature(s) Signed: 12/22/2018 5:45:12 PM By: Baltazar Najjar MD Entered By: Baltazar Najjar on 12/22/2018 13:53:46 -------------------------------------------------------------------------------- SuperBill Details Patient Name: Date of Service: Martin Singleton 12/22/2018 Medical Record [id number] Patient Account Number: [id number] Date of Birth/Sex: Treating RN: [birth year] (63 y.o. 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

## 2019-01-24 NOTE — Progress Notes (Signed)
GARRISON, MICHIE ([id number]) Visit Report for 12/01/2018 Arrival Information Details Patient Name: Date of Service: Martin Singleton, Martin Singleton 12/01/2018 12:30 PM Medical Record [id number] Patient Account Number: [id number] Date of Birth/Sex: Treating RN: [birth year] (63 y.o. Martin Singleton) Carlene Coria Primary Care Martin Singleton: PATIENT, NO Other Clinician: Referring Martin Singleton: Treating Martin Singleton/Extender:Robson, Martin Singleton in Treatment: 25 Visit Information History Since Last Visit Cane All ordered tests and consults were completed: No Patient Arrived: 13:01 Added or deleted any medications: No Arrival Time: sister Any new allergies or adverse reactions: No Accompanied By: None Had a fall or experienced change in No Transfer Assistance: activities of daily living that may affect Patient Identification Verified: Yes risk of falls: Secondary Verification Process Completed: Yes Signs or symptoms of abuse/neglect since last No Patient Requires Transmission-Based No visito Precautions: Hospitalized since last visit: No Patient Has Alerts: No Implantable device outside of the clinic excluding No cellular tissue based products placed in the center since last visit: Has Dressing in Place as Prescribed: Yes Pain Present Now: No Electronic Signature(s) Signed: 01/24/2019 3:01:15 PM By: Carlene Coria RN Entered By: Carlene Coria on 12/01/2018 13:01:32 -------------------------------------------------------------------------------- Encounter Discharge Information Details Patient Name: Date of Service: Martin Few. 12/01/2018 12:30 PM Medical Record [id number] Patient Account Number: [id number] Date of Birth/Sex: Treating RN: [birth year] (63 y.o. Martin Singleton Primary Care Tyris Eliot: PATIENT, NO Other Clinician: Referring Martin Singleton: Treating Jaxston Chohan/Extender:Robson, Martin Singleton in Treatment: 25 Encounter Discharge Information Items Post Procedure Vitals Discharge Condition:  Stable Temperature (F): 98.6 Ambulatory Status: Ambulatory Pulse (bpm): 75 Discharge Destination: Home Respiratory Rate (breaths/min): 18 Transportation: Private Auto Blood Pressure (mmHg): 116/77 Accompanied By: family member Schedule Follow-up Appointment: Yes Clinical Summary of Care: Patient Declined Electronic Signature(s) Signed: 12/01/2018 6:42:53 PM By: Kela Millin Entered By: Kela Millin on 12/01/2018 13:45:29 -------------------------------------------------------------------------------- Multi Wound Chart Details Patient Name: Date of Service: Martin Few. 12/01/2018 12:30 PM Medical Record [id number] Patient Account Number: [id number] Date of Birth/Sex: Treating RN: [birth year] (63 y.o. Martin Singleton Primary Care Leelyn Jasinski: PATIENT, NO Other Clinician: Referring Giankarlo Leamer: Treating Martin Singleton/Extender:Robson, Martin Singleton in Treatment: 25 Vital Signs Height(in): 73 Pulse(bpm): 75 Weight(lbs): 150 Blood Pressure(mmHg): 116/77 Body Mass Index(BMI): 20 Temperature(F): 98.6 Respiratory 18 Rate(breaths/min): Photos: [1:No Photos] [N/A:N/A] Wound Location: [1:Right Trochanter] [N/A:N/A] Wounding Event: [1:Pressure Injury] [N/A:N/A] Primary Etiology: [1:Pressure Ulcer] [N/A:N/A] Date Acquired: [1:12/17/2017] [N/A:N/A] Weeks of Treatment: [1:25] [N/A:N/A] Wound Status: [1:Open] [N/A:N/A] Measurements L x W x D [1:0.5x0.2x0.1] [N/A:N/A] (cm) Area (cm) : [1:0.079] [N/A:N/A] Volume (cm) : [4:2.683] [N/A:N/A] % Reduction in Area: [1:99.20%] [N/A:N/A] % Reduction in Volume: [1:100.00%] [N/A:N/A] Classification: [1:Category/Stage IV] [N/A:N/A] Exudate Amount: [1:Small] [N/A:N/A] Exudate Type: [1:Serosanguineous] [N/A:N/A] Exudate Color: [1:red, brown] [N/A:N/A] Wound Margin: [1:Flat and Intact] [N/A:N/A] Granulation Amount: [1:None Present (0%)] [N/A:N/A] Necrotic Amount: [1:Large (67-100%)] [N/A:N/A] Exposed Structures: [1:Fat Layer  (Subcutaneous Tissue) Exposed: Yes Fascia: No Tendon: No Muscle: No Joint: No Bone: No] [N/A:N/A] Epithelialization: [1:Large (67-100%)] [N/A:N/A] Debridement: [1:Debridement - Excisional] [N/A:N/A] Pre-procedure [1:13:07] [N/A:N/A] Verification/Time Out Taken: Tissue Debrided: [1:Subcutaneous, Slough] [N/A:N/A] Level: [1:Skin/Subcutaneous Tissue] [N/A:N/A] Debridement Area (sq cm):0.1 [N/A:N/A] Instrument: [1:Curette] [N/A:N/A] Bleeding: [1:Minimum] [N/A:N/A] Hemostasis Achieved: [1:Pressure] [N/A:N/A] Procedural Pain: [1:0] [N/A:N/A] Post Procedural Pain: [1:0] [N/A:N/A] Debridement Treatment Procedure was tolerated [N/A:N/A] Response: [1:well] Post Debridement [1:0.5x0.2x0.1] [N/A:N/A] Measurements L x W x D (cm) Post Debridement [4:1.962] [N/A:N/A] Volume: (cm) Post Debridement Stage: Category/Stage IV [N/A:N/A N/A] Treatment Notes Electronic Signature(s) Signed: 12/01/2018 5:35:18 PM By: Martin Ham MD Signed: 12/02/2018 6:00:55 PM By: Levan Hurst RN, BSN Entered By: Martin Singleton on 12/01/2018  13:12:33 -------------------------------------------------------------------------------- Multi-Disciplinary Care Plan Details Patient Name: Date of Service: Martin Singleton, Martin Singleton 12/01/2018 12:30 PM Medical Record [id number] Patient Account Number: [id number] Date of Birth/Sex: Treating RN: Martin Singleton (63 y.o. Martin Singleton Primary Care Hayden Mabin: PATIENT, NO Other Clinician: Referring Jaydon Soroka: Treating Kimala Horne/Extender:Robson, Martin Singleton in Treatment: 25 Active Inactive Pressure Nursing Diagnoses: Knowledge deficit related to causes and risk factors for pressure ulcer development Knowledge deficit related to management of pressures ulcers Goals: Patient/caregiver will verbalize risk factors for pressure ulcer development Date Initiated: 06/03/2018 Target Resolution Date: 01/13/2019 Goal Status: Active Patient/caregiver will verbalize understanding of  pressure ulcer management Date Initiated: 06/03/2018 Date Inactivated: 06/23/2018 Target Resolution Date: 07/01/2018 Goal Status: Met Interventions: Assess offloading mechanisms upon admission and as needed Assess potential for pressure ulcer upon admission and as needed Provide education on pressure ulcers Notes: Electronic Signature(s) Signed: 12/02/2018 6:00:55 PM By: Levan Hurst RN, BSN Entered By: Levan Hurst on 12/01/2018 18:12:34 -------------------------------------------------------------------------------- Pain Assessment Details Patient Name: Date of Service: Martin Few 12/01/2018 12:30 PM Medical Record [id number] Patient Account Number: [id number] Date of Birth/Sex: Treating RN: [birth year] (63 y.o. Martin Singleton) Carlene Coria Primary Care Bard Haupert: PATIENT, NO Other Clinician: Referring Glynda Soliday: Treating Ignatius Kloos/Extender:Robson, Martin Singleton in Treatment: 25 Active Problems Location of Pain Severity and Description of Pain Patient Has Paino No Site Locations Pain Management and Medication Current Pain Management: Electronic Signature(s) Signed: 01/24/2019 3:01:15 PM By: Carlene Coria RN Entered By: Carlene Coria on 12/01/2018 13:02:04 -------------------------------------------------------------------------------- Patient/Caregiver Education Details Patient Name: Date of Service: Martin Few 10/15/2020andnbsp12:30 PM Medical Record [id number] Patient Account Number: [id number] Date of Birth/Gender: Treating RN: [birth year] (63 y.o. Martin Singleton Primary Care Physician: PATIENT, NO Other Clinician: Referring Physician: Treating Physician/Extender:Robson, Martin Singleton in Treatment: 25 Education Assessment Education Provided To: Patient Education Topics Provided Wound/Skin Impairment: Methods: Explain/Verbal Responses: State content correctly Electronic Signature(s) Signed: 12/02/2018 6:00:55 PM By: Levan Hurst RN, BSN Entered By: Levan Hurst on 12/01/2018 18:12:43 -------------------------------------------------------------------------------- Wound Assessment Details Patient Name: Date of Service: Martin Few 12/01/2018 12:30 PM Medical Record [id number] Patient Account Number: [id number] Date of Birth/Sex: Treating RN: [birth year] (63 y.o. Martin Singleton) Carlene Coria Primary Care Cailan Antonucci: PATIENT, NO Other Clinician: Referring Ksenia Kunz: Treating Katheren Jimmerson/Extender:Robson, Martin Singleton in Treatment: 25 Wound Status Wound Number: 1 Primary Etiology: Pressure Ulcer Wound Location: Right Trochanter Wound Status: Open Wounding Event: Pressure Injury Date Acquired: 12/17/2017 Weeks Of Treatment: 25 Clustered Wound: No Photos Wound Measurements Length: (cm) 0.5 Width: (cm) 0.2 Depth: (cm) 0.1 Area: (cm) 0.079 Volume: (cm) 0.008 Wound Description Classification: Category/Stage IV Wound Margin: Flat and Intact Exudate Amount: Small Exudate Type: Serosanguineous Exudate Color: red, brown Wound Bed Granulation Amount: None Present (0%) Necrotic Amount: Large (67-100%) Necrotic Quality: Adherent Slough fter Cleansing: No ino Yes Exposed Structure ed: No ubcutaneous Tissue) Exposed: Yes ed: No ed: No d: No : No % Reduction in Area: 99.2% % Reduction in Volume: 100% Epithelialization: Large (67-100%) Tunneling: No Undermining: No Foul Odor A Slough/Fibr Fascia Expos Fat Layer (S Tendon Expos Muscle Expos Joint Expose Bone Exposed Electronic Signature(s) Signed: 12/06/2018 1:32:19 PM By: Mikeal Hawthorne EMT/HBOT Signed: 01/24/2019 3:01:15 PM By: Carlene Coria RN Entered By: Mikeal Hawthorne on 12/05/2018 08:44:11 -------------------------------------------------------------------------------- Vitals Details Patient Name: Date of Service: Martin Few 12/01/2018 12:30 PM Medical Record [id number] Patient Account Number: [id number] Date of Birth/Sex: Treating RN: [birth year] Singleton (63 y.o. Oval Linsey Primary Care Sajid Ruppert: PATIENT, NO Other Clinician: Referring Juwana Thoreson: Treating Shamika Pedregon/Extender:Robson, Martin Singleton in Treatment: 25 Vital Signs Time Taken: 13:01  Temperature (F): 98.6 Height (in): 73 Pulse (bpm): 75 Weight (lbs): 150 Respiratory Rate (breaths/min): 18 Body Mass Index (BMI): 19.8 Blood Pressure (mmHg): 116/77 Reference Range: 80 - 120 mg / dl Electronic Signature(s) Signed: 01/24/2019 3:01:15 PM By: Carlene Coria RN Entered By: Carlene Coria on 12/01/2018 13:01:56

## 2019-01-24 NOTE — Progress Notes (Signed)
Martin Singleton ([id number]) Visit Report for 10/20/2018 Arrival Information Details Patient Name: Date of Service: Martin Singleton, Martin Singleton 10/20/2018 12:30 PM Medical Record [id number] Patient Account Number: [id number] Date of Birth/Sex: Treating RN: [birth year] (63 y.o. Lorette Ang, Meta.Reding Primary Care Tymia Streb: PATIENT, NO Other Clinician: Referring Katria Botts: Treating Aleesa Sweigert/Extender:Robson, Esperanza Richters in Treatment: 35 Visit Information History Since Last Visit Added or deleted any medications: No Patient Arrived: Ambulatory Any new allergies or adverse reactions: No Arrival Time: 12:46 Had a fall or experienced change in No Accompanied By: wife activities of daily living that may affect Transfer Assistance: None risk of falls: Patient Requires Transmission-Based No Signs or symptoms of abuse/neglect since last No Precautions: visito Patient Has Alerts: No Hospitalized since last visit: No Implantable device outside of the clinic excluding No cellular tissue based products placed in the center since last visit: Has Dressing in Place as Prescribed: Yes Pain Present Now: Yes Electronic Signature(s) Signed: 01/24/2019 3:04:24 PM By: Sandre Kitty Entered By: Sandre Kitty on 10/20/2018 12:48:26 -------------------------------------------------------------------------------- Clinic Level of Care Assessment Details Patient Name: Date of Service: Martin Singleton 10/20/2018 12:30 PM Medical Record [id number] Patient Account Number: [id number] Date of Birth/Sex: Treating RN: 1955-11-19 (63 y.o. Hessie Diener Primary Care Aja Whitehair: PATIENT, NO Other Clinician: Referring Anaclara Acklin: Treating Constantin Hillery/Extender:Robson, Esperanza Richters in Treatment: 49 Clinic Level of Care Assessment Items TOOL 4 Quantity Score X - Use when only an EandM is performed on FOLLOW-UP visit 1 0 ASSESSMENTS - Nursing Assessment / Reassessment X - Reassessment of Co-morbidities (includes updates  in patient status) 1 10 X - Reassessment of Adherence to Treatment Plan 1 5 ASSESSMENTS - Wound and Skin Assessment / Reassessment X - Simple Wound Assessment / Reassessment - one wound 1 5 []  - Complex Wound Assessment / Reassessment - multiple wounds 0 X - Dermatologic / Skin Assessment (not related to wound area) 1 10 ASSESSMENTS - Focused Assessment []  - Circumferential Edema Measurements - multi extremities 0 X - Nutritional Assessment / Counseling / Intervention 1 10 []  - Lower Extremity Assessment (monofilament, tuning fork, pulses) 0 []  - Peripheral Arterial Disease Assessment (using hand held doppler) 0 ASSESSMENTS - Ostomy and/or Continence Assessment and Care []  - Incontinence Assessment and Management 0 []  - Ostomy Care Assessment and Management (repouching, etc.) 0 PROCESS - Coordination of Care X - Simple Patient / Family Education for ongoing care 1 15 []  - Complex (extensive) Patient / Family Education for ongoing care 0 X - Staff obtains Programmer, systems, Records, Test Results / Process Orders 1 10 []  - Staff telephones HHA, Nursing Homes / Clarify orders / etc 0 []  - Routine Transfer to another Facility (non-emergent condition) 0 []  - Routine Hospital Admission (non-emergent condition) 0 []  - New Admissions / Biomedical engineer / Ordering NPWT, Apligraf, etc. 0 []  - Emergency Hospital Admission (emergent condition) 0 X - Simple Discharge Coordination 1 10 []  - Complex (extensive) Discharge Coordination 0 PROCESS - Special Needs []  - Pediatric / Minor Patient Management 0 []  - Isolation Patient Management 0 []  - Hearing / Language / Visual special needs 0 []  - Assessment of Community assistance (transportation, D/C planning, etc.) 0 []  - Additional assistance / Altered mentation 0 []  - Support Surface(s) Assessment (bed, cushion, seat, etc.) 0 INTERVENTIONS - Wound Cleansing / Measurement X - Simple Wound Cleansing - one wound 1 5 []  - Complex Wound Cleansing -  multiple wounds 0 X - Wound Imaging (photographs - any number of wounds) 1 5 []  - Wound Tracing (instead  of photographs) 0 X - Simple Wound Measurement - one wound 1 5 []  - Complex Wound Measurement - multiple wounds 0 INTERVENTIONS - Wound Dressings X - Small Wound Dressing one or multiple wounds 1 10 []  - Medium Wound Dressing one or multiple wounds 0 []  - Large Wound Dressing one or multiple wounds 0 []  - Application of Medications - topical 0 []  - Application of Medications - injection 0 INTERVENTIONS - Miscellaneous []  - External ear exam 0 []  - Specimen Collection (cultures, biopsies, blood, body fluids, etc.) 0 []  - Specimen(s) / Culture(s) sent or taken to Lab for analysis 0 []  - Patient Transfer (multiple staff / Civil Service fast streamer / Similar devices) 0 []  - Simple Staple / Suture removal (25 or less) 0 []  - Complex Staple / Suture removal (26 or more) 0 []  - Hypo / Hyperglycemic Management (close monitor of Blood Glucose) 0 []  - Ankle / Brachial Index (ABI) - do not check if billed separately 0 X - Vital Signs 1 5 Has the patient been seen at the hospital within the last three years: Yes Total Score: 105 Level Of Care: New/Established - Level 3 Electronic Signature(s) Signed: 10/20/2018 5:44:03 PM By: Deon Pilling Entered By: Deon Pilling on 10/20/2018 13:23:09 -------------------------------------------------------------------------------- Encounter Discharge Information Details Patient Name: Date of Service: Martin Singleton 10/20/2018 12:30 PM Medical Record [id number] Patient Account Number: [id number] Date of Birth/Sex: Treating RN: Apr 27, 1955 (63 y.o. Jerilynn Mages) Carlene Coria Primary Care Nayzeth Altman: PATIENT, NO Other Clinician: Referring Quadre Bristol: Treating Manahil Vanzile/Extender:Robson, Esperanza Richters in Treatment: 19 Encounter Discharge Information Items Discharge Condition: Stable Ambulatory Status: Ambulatory Discharge Destination: Home Transportation: Private Auto Accompanied  By: sister Schedule Follow-up Appointment: Yes Clinical Summary of Care: Patient Declined Electronic Signature(s) Signed: 01/24/2019 3:04:22 PM By: Carlene Coria RN Entered By: Carlene Coria on 10/20/2018 13:33:31 -------------------------------------------------------------------------------- Lower Extremity Assessment Details Patient Name: Date of Service: Sohil, Timko 10/20/2018 12:30 PM Medical Record [id number] Patient Account Number: [id number] Date of Birth/Sex: Treating RN: 1955/03/01 (63 y.o. Hessie Diener Primary Care Payslie Mccaig: PATIENT, NO Other Clinician: Referring Dearl Rudden: Treating Kiele Heavrin/Extender:Robson, Esperanza Richters in Treatment: 19 Electronic Signature(s) Signed: 10/20/2018 5:44:03 PM By: Deon Pilling Signed: 01/24/2019 3:04:24 PM By: Sandre Kitty Entered By: Sandre Kitty on 10/20/2018 12:49:05 -------------------------------------------------------------------------------- Multi Wound Chart Details Patient Name: Date of Service: Vincient, Vanaman 10/20/2018 12:30 PM Medical Record [id number] Patient Account Number: [id number] Date of Birth/Sex: Treating RN: 07/15/1955 (63 y.o. Hessie Diener Primary Care Gabrielle Wakeland: PATIENT, NO Other Clinician: Referring Myranda Pavone: Treating Markas Aldredge/Extender:Robson, Esperanza Richters in Treatment: 19 Vital Signs Height(in): 73 Pulse(bpm): 68 Weight(lbs): 150 Blood Pressure(mmHg): 108/72 Body Mass Index(BMI): 20 Temperature(F): 98.4 Respiratory 16 Rate(breaths/min): Photos: [1:No Photos] [N/A:N/A] Wound Location: [1:Right Trochanter] [N/A:N/A] Wounding Event: [1:Pressure Injury] [N/A:N/A] Primary Etiology: [1:Pressure Ulcer] [N/A:N/A] Date Acquired: [1:12/17/2017] [N/A:N/A] Weeks of Treatment: [1:19] [N/A:N/A] Wound Status: [1:Open] [N/A:N/A] Measurements L x W x D [1:0.5x1x0.1] [N/A:N/A] (cm) Area (cm) : [1:0.393] [N/A:N/A] Volume (cm) : [1:0.039] [N/A:N/A] % Reduction in Area: [1:96.00%] [N/A:N/A] %  Reduction in Volume: [1:99.80%] [N/A:N/A] Classification: [1:Category/Stage IV] [N/A:N/A] Exudate Amount: [1:Medium] [N/A:N/A] Exudate Type: [1:Serosanguineous] [N/A:N/A] Exudate Color: [1:red, brown] [N/A:N/A] Wound Margin: [1:Flat and Intact] [N/A:N/A] Granulation Amount: [1:Large (67-100%)] [N/A:N/A] Granulation Quality: [1:Pink, Pale] [N/A:N/A] Necrotic Amount: [1:Small (1-33%)] [N/A:N/A] Exposed Structures: [1:Fat Layer (Subcutaneous Tissue) Exposed: Yes Fascia: No Tendon: No Muscle: No Joint: No Bone: No Medium (34-66%)] [N/A:N/A N/A] Treatment Notes Electronic Signature(s) Signed: 10/20/2018 5:10:59 PM By: [name] Ham MD Signed: 10/20/2018 5:44:03 PM By: Deon Pilling Entered By: Dellia Nims,  Michael on 10/20/2018 13:20:24 -------------------------------------------------------------------------------- Multi-Disciplinary Care Plan Details Patient Name: Date of Service: Tavarious, Freel 10/20/2018 12:30 PM Medical Record [id number] Patient Account Number: [id number] Date of Birth/Sex: Treating RN: [birth year] (63 y.o. Hessie Diener Primary Care Roye Gustafson: PATIENT, NO Other Clinician: Referring Ethyle Tiedt: Treating Allien Melberg/Extender:Robson, Esperanza Richters in Treatment: 19 Active Inactive Pressure Nursing Diagnoses: Knowledge deficit related to causes and risk factors for pressure ulcer development Knowledge deficit related to management of pressures ulcers Goals: Patient/caregiver will verbalize risk factors for pressure ulcer development Date Initiated: 06/03/2018 Target Resolution Date: 11/18/2018 Goal Status: Active Patient/caregiver will verbalize understanding of pressure ulcer management Date Initiated: 06/03/2018 Date Inactivated: 06/23/2018 Target Resolution Date: 07/01/2018 Goal Status: Met Interventions: Assess offloading mechanisms upon admission and as needed Assess potential for pressure ulcer upon admission and as needed Provide education on pressure  ulcers Notes: Electronic Signature(s) Signed: 10/20/2018 5:44:03 PM By: Deon Pilling Entered By: Deon Pilling on 10/20/2018 13:13:06 -------------------------------------------------------------------------------- Pain Assessment Details Patient Name: Date of Service: Celedonio, Sortino 10/20/2018 12:30 PM Medical Record [id number] Patient Account Number: [id number] Date of Birth/Sex: Treating RN: 1955/03/10 (63 y.o. Hessie Diener Primary Care Deidrea Gaetz: PATIENT, NO Other Clinician: Referring Randilyn Foisy: Treating Chontel Warning/Extender:Robson, Esperanza Richters in Treatment: 19 Active Problems Location of Pain Severity and Description of Pain Patient Has Paino Yes Site Locations Rate the pain. Current Pain Level: 3 Pain Management and Medication Current Pain Management: Electronic Signature(s) Signed: 10/20/2018 5:44:03 PM By: Deon Pilling Signed: 01/24/2019 3:04:24 PM By: Sandre Kitty Entered By: Sandre Kitty on 10/20/2018 12:48:53 -------------------------------------------------------------------------------- Patient/Caregiver Education Details Patient Name: Date of Service: Martin Singleton 9/3/2020andnbsp12:30 PM Medical Record [phone] Patient Account Number: [id number] Date of Birth/Gender: 1955/11/13 (63 y.o. M) Treating RN: Deon Pilling Primary Care Physician: PATIENT, NO Other Clinician: Referring Physician: Treating Physician/Extender:Robson, Esperanza Richters in Treatment: 18 Education Assessment Education Provided To: Patient Education Topics Provided Pressure: Handouts: Pressure Ulcers: Care and Offloading Methods: Explain/Verbal Responses: Reinforcements needed Electronic Signature(s) Signed: 10/20/2018 5:44:03 PM By: Deon Pilling Entered By: Deon Pilling on 10/20/2018 13:13:19 -------------------------------------------------------------------------------- Wound Assessment Details Patient Name: Date of Service: Arseniy, Toomey 10/20/2018 12:30  PM Medical Record [id number] Patient Account Number: [id number] Date of Birth/Sex: Treating RN: [birth year] (63 y.o. Hessie Diener Primary Care Alcie Runions: PATIENT, NO Other Clinician: Referring Joeangel Jeanpaul: Treating Kale Dols/Extender:Robson, Esperanza Richters in Treatment: 19 Wound Status Wound Number: 1 Primary Etiology: Pressure Ulcer Wound Location: Right Trochanter Wound Status: Open Wounding Event: Pressure Injury Date Acquired: 12/17/2017 Weeks Of Treatment: 19 Clustered Wound: No Photos Wound Measurements Length: (cm) 0.5 Width: (cm) 1 Depth: (cm) 0.1 Area: (cm) 0.393 Volume: (cm) 0.039 Wound Description Classification: Category/Stage IV Wound Margin: Flat and Intact Exudate Amount: Medium Exudate Type: Serosanguineous Exudate Color: red, brown Wound Bed Granulation Amount: Large (67-100%) Granulation Quality: Pink, Pale Necrotic Amount: Small (1-33%) Necrotic Quality: Adherent Slough r After Cleansing: No ibrino Yes Exposed Structure posed: No (Subcutaneous Tissue) Exposed: Yes posed: No posed: No osed: No sed: No % Reduction in Area: 96% % Reduction in Volume: 99.8% Epithelialization: Medium (34-66%) Tunneling: No Undermining: No Foul Odo Slough/F Fascia Ex Fat Layer Tendon Ex Muscle Ex Joint Exp Bone Expo Electronic Signature(s) Signed: 10/21/2018 4:32:29 PM By: Mikeal Hawthorne EMT/HBOT Signed: 10/21/2018 6:15:35 PM By: Deon Pilling Previous Signature: 10/20/2018 5:44:03 PM Version By: Deon Pilling Entered By: Mikeal Hawthorne on 10/21/2018 09:35:49 -------------------------------------------------------------------------------- Vitals Details Patient Name: Date of Service: Martin Singleton 10/20/2018 12:30 PM Medical Record [id number] Patient Account Number: [id number] Date of Birth/Sex: Treating RN: Jun 29, 1955 (63  y.o. Hessie Diener Primary Care Mick Tanguma: PATIENT, NO Other Clinician: Referring Deandre Stansel: Treating  Teira Arcilla/Extender:Robson, Esperanza Richters in Treatment: 19 Vital Signs Time Taken: 12:48 Temperature (F): 98.4 Height (in): 73 Pulse (bpm): 88 Weight (lbs): 150 Respiratory Rate (breaths/min): 16 Body Mass Index (BMI): 19.8 Blood Pressure (mmHg): 108/72 Reference Range: 80 - 120 mg / dl Electronic Signature(s) Signed: 01/24/2019 3:04:24 PM By: Sandre Kitty Entered By: Sandre Kitty on 10/20/2018 12:48:44

## 2019-01-24 NOTE — Progress Notes (Signed)
EUCLIDE, GRANITO ([id number]) Visit Report for 11/03/2018 Arrival Information Details Patient Name: Date of Service: Martin Singleton 11/03/2018 12:30 PM Medical Record [id number] Patient Account Number: [id number] Date of Birth/Sex: Treating RN: 1956/01/08 (63 y.o. Jerilynn Mages) Carlene Coria Primary Care Dillyn Joaquin: PATIENT, NO Other Clinician: Referring Jene Oravec: Treating Dhruv Christina/Extender:Robson, Esperanza Richters in Treatment: 21 Visit Information History Since Last Visit Cane All ordered tests and consults were completed: No Patient Arrived: 13:27 Added or deleted any medications: No Arrival Time: Any new allergies or adverse reactions: No Accompanied By: self None Had a fall or experienced change in No Transfer Assistance: activities of daily living that may affect Patient Identification Verified: Yes risk of falls: Secondary Verification Process Completed: Yes Signs or symptoms of abuse/neglect since last No Patient Requires Transmission-Based No visito Precautions: Hospitalized since last visit: No Patient Has Alerts: No Implantable device outside of the clinic excluding No cellular tissue based products placed in the center since last visit: Has Dressing in Place as Prescribed: Yes Pain Present Now: Yes Electronic Signature(s) Signed: 01/24/2019 3:04:22 PM By: Carlene Coria RN Entered By: Carlene Coria on 11/03/2018 13:28:13 -------------------------------------------------------------------------------- Encounter Discharge Information Details Patient Name: Date of Service: Martin Singleton 11/03/2018 12:30 PM Medical Record [id number] Patient Account Number: [id number] Date of Birth/Sex: Treating RN: [birth year] (63 y.o. Martin Singleton Primary Care Clariece Roesler: PATIENT, NO Other Clinician: Referring Jacelynn Hayton: Treating Quenesha Douglass/Extender:Robson, Esperanza Richters in Treatment: 21 Encounter Discharge Information Items Post Procedure Vitals Discharge Condition:  Stable Temperature (F): 97.8 Ambulatory Status: Ambulatory Pulse (bpm): 73 Discharge Destination: Home Respiratory Rate (breaths/min): 18 Transportation: Private Auto Blood Pressure (mmHg): 115/84 Accompanied By: family member Schedule Follow-up Appointment: Yes Clinical Summary of Care: Patient Declined Electronic Signature(s) Signed: 01/24/2019 3:04:22 PM By: Carlene Coria RN Entered By: Carlene Coria on 11/03/2018 15:22:06 -------------------------------------------------------------------------------- Multi Wound Chart Details Patient Name: Date of Service: Martin Singleton 11/03/2018 12:30 PM Medical Record [id number] Patient Account Number: [id number] Date of Birth/Sex: Treating RN: [birth year] (63 y.o. Martin Singleton Primary Care Charli Liberatore: PATIENT, NO Other Clinician: Referring Tulio Facundo: Treating Vilma Will/Extender:Robson, Esperanza Richters in Treatment: 21 Vital Signs Height(in): 73 Pulse(bpm): 50 Weight(lbs): 150 Blood Pressure(mmHg): 115/84 Body Mass Index(BMI): 20 Temperature(F): 97.8 Respiratory 18 Rate(breaths/min): Photos: [1:No Photos] [N/A:N/A] Wound Location: [1:Right Trochanter] [N/A:N/A] Wounding Event: [1:Pressure Injury] [N/A:N/A] Primary Etiology: [1:Pressure Ulcer] [N/A:N/A] Date Acquired: [1:12/17/2017] [N/A:N/A] Weeks of Treatment: [1:21] [N/A:N/A] Wound Status: [1:Open] [N/A:N/A] Measurements L x W x D [1:0.8x0.3x0.1] [N/A:N/A] (cm) Area (cm) : [1:0.188] [N/A:N/A] Volume (cm) : [1:0.019] [N/A:N/A] % Reduction in Area: [1:98.10%] [N/A:N/A] % Reduction in Volume: [1:99.90%] [N/A:N/A] Classification: [1:Category/Stage IV] [N/A:N/A] Exudate Amount: [1:Small] [N/A:N/A] Exudate Type: [1:Serosanguineous] [N/A:N/A] Exudate Color: [1:red, brown] [N/A:N/A] Wound Margin: [1:Flat and Intact] [N/A:N/A] Granulation Amount: [1:Large (67-100%)] [N/A:N/A] Granulation Quality: [1:Pink, Pale] [N/A:N/A] Necrotic Amount: [1:Small (1-33%)] [N/A:N/A] Exposed  Structures: [1:Fat Layer (Subcutaneous Tissue) Exposed: Yes Fascia: No Tendon: No Muscle: No Joint: No Bone: No] [N/A:N/A] Epithelialization: [1:Medium (34-66%)] [N/A:N/A] Debridement: [1:Debridement - Excisional] [N/A:N/A] Pre-procedure [1:13:49] [N/A:N/A] Verification/Time Out Taken: Pain Control: [1:Lidocaine 4% Topical Solution] [N/A:N/A] Tissue Debrided: [1:Callus, Subcutaneous] [N/A:N/A] Level: [1:Skin/Subcutaneous Tissue] [N/A:N/A] Debridement Area (sq cm):0.24 [N/A:N/A] Instrument: [1:Curette] [N/A:N/A] Bleeding: [1:Minimum] [N/A:N/A] Hemostasis Achieved: [1:Pressure] [N/A:N/A] Procedural Pain: [1:0] [N/A:N/A] Post Procedural Pain: [1:0] [N/A:N/A] Debridement Treatment Procedure was tolerated [N/A:N/A] Response: [1:well] Post Debridement [1:0.8x0.3x0.1] [N/A:N/A] Measurements L x W x D (cm) Post Debridement [1:0.019] [N/A:N/A] Volume: (cm) Post Debridement Stage: Category/Stage IV [N/A:N/A N/A] Treatment Notes Electronic Signature(s) Signed: 11/03/2018 5:09:12 PM By: [name] Ham MD Signed: 11/03/2018 5:51:26  PM By: Deon Pilling Entered By: [name] Ham on 11/03/2018 13:56:44 -------------------------------------------------------------------------------- Multi-Disciplinary Care Plan Details Patient Name: Date of Service: Martin, Singleton 11/03/2018 12:30 PM Medical Record [id number] Patient Account Number: [id number] Date of Birth/Sex: Treating RN: November 22, 1955 (63 y.o. Martin Singleton Primary Care Averie Meiner: PATIENT, NO Other Clinician: Referring Raylinn Kosar: Treating Beva Remund/Extender:Robson, Esperanza Richters in Treatment: 21 Active Inactive Pressure Nursing Diagnoses: Knowledge deficit related to causes and risk factors for pressure ulcer development Knowledge deficit related to management of pressures ulcers Goals: Patient/caregiver will verbalize risk factors for pressure ulcer development Date Initiated: 06/03/2018 Target Resolution Date:  11/18/2018 Goal Status: Active Patient/caregiver will verbalize understanding of pressure ulcer management Date Initiated: 06/03/2018 Date Inactivated: 06/23/2018 Target Resolution Date: 07/01/2018 Goal Status: Met Interventions: Assess offloading mechanisms upon admission and as needed Assess potential for pressure ulcer upon admission and as needed Provide education on pressure ulcers Notes: Electronic Signature(s) Signed: 11/03/2018 5:51:26 PM By: Deon Pilling Entered By: Deon Pilling on 11/03/2018 13:51:54 -------------------------------------------------------------------------------- Pain Assessment Details Patient Name: Date of Service: Martin, Singleton 11/03/2018 12:30 PM Medical Record [id number] Patient Account Number: [id number] Date of Birth/Sex: Treating RN: 1955/06/20 (63 y.o. Jerilynn Mages) Carlene Coria Primary Care Vanna Sailer: PATIENT, NO Other Clinician: Referring Benjamen Koelling: Treating Marquice Uddin/Extender:Robson, Esperanza Richters in Treatment: 21 Active Problems Location of Pain Severity and Description of Pain Patient Has Paino Yes Site Locations Pain Location: Pain in Ulcers With Dressing Change: Yes Duration of the Pain. Constant / Intermittento Constant Rate the pain. Current Pain Level: 2 Worst Pain Level: 5 Least Pain Level: 2 Tolerable Pain Level: 5 Character of Pain Describe the Pain: Aching Pain Management and Medication Current Pain Management: Medication: Yes Cold Application: No Rest: Yes Massage: No Activity: No T.E.N.S.: No Heat Application: No Leg drop or elevation: No Is the Current Pain Management Adequate: Inadequate How does your wound impact your activities of daily livingo Sleep: Yes Bathing: No Appetite: Yes Relationship With Others: No Bladder Continence: No Emotions: No Bowel Continence: No Work: No Toileting: No Drive: No Dressing: No Hobbies: No Electronic Signature(s) Signed: 01/24/2019 3:04:22 PM By: Carlene Coria RN Entered By:  Carlene Coria on 11/03/2018 13:29:11 -------------------------------------------------------------------------------- Patient/Caregiver Education Details Patient Name: Date of Service: Martin Singleton 9/17/2020andnbsp12:30 PM Medical Record [phone] Patient Account Number: [id number] Date of Birth/Gender: June 13, 1955 (63 y.o. M) Treating RN: Deon Pilling Primary Care Physician: PATIENT, NO Other Clinician: Referring Physician: Treating Physician/Extender:Robson, Esperanza Richters in Treatment: 21 Education Assessment Education Provided To: Patient Education Topics Provided Pressure: Handouts: Preventing Pressure Ulcers Methods: Explain/Verbal Responses: Reinforcements needed Electronic Signature(s) Signed: 11/03/2018 5:51:26 PM By: Deon Pilling Entered By: Deon Pilling on 11/03/2018 13:52:07 -------------------------------------------------------------------------------- Wound Assessment Details Patient Name: Date of Service: Martin, Singleton 11/03/2018 12:30 PM Medical Record [id number] Patient Account Number: [id number] Date of Birth/Sex: Treating RN: 1955/09/09 (63 y.o. Jerilynn Mages) Carlene Coria Primary Care Laurinda Carreno: PATIENT, NO Other Clinician: Referring Jenafer Winterton: Treating Aubrianna Orchard/Extender:Robson, Esperanza Richters in Treatment: 21 Wound Status Wound Number: 1 Primary Etiology: Pressure Ulcer Wound Location: Right Trochanter Wound Status: Open Wounding Event: Pressure Injury Date Acquired: 12/17/2017 Weeks Of Treatment: 21 Clustered Wound: No Photos Wound Measurements Length: (cm) 0.8 Width: (cm) 0.3 Depth: (cm) 0.1 Area: (cm) 0.188 Volume: (cm) 0.019 Wound Description Classification: Category/Stage IV Wound Margin: Flat and Intact Exudate Amount: Small Exudate Type: Serosanguineous Exudate Color: red, brown Wound Bed Granulation Amount: Large (67-100%) Granulation Quality: Pink, Pale Necrotic Amount: Small (1-33%) Necrotic Quality: Adherent Slough After  Cleansing: No rino Yes Exposed Structure sed: No Subcutaneous Tissue) Exposed: Yes  sed: No sed: No ed: No d: No % Reduction in Area: 98.1% % Reduction in Volume: 99.9% Epithelialization: Medium (34-66%) Tunneling: No Undermining: No Foul Odor Slough/Fib Fascia Expo Fat Layer ( Tendon Expo Muscle Expo Joint Expos Bone Expose Electronic Signature(s) Signed: 11/04/2018 3:55:03 PM By: Mikeal Hawthorne EMT/HBOT Signed: 01/24/2019 3:04:22 PM By: Carlene Coria RN Entered By: Mikeal Hawthorne on 11/04/2018 09:40:09 -------------------------------------------------------------------------------- Vitals Details Patient Name: Date of Service: Martin Singleton 11/03/2018 12:30 PM Medical Record [id number] Patient Account Number: [id number] Date of Birth/Sex: Treating RN: 1956-02-12 (63 y.o. Jerilynn Mages) Dolores Lory, [place] Primary Care Verenis Nicosia: PATIENT, NO Other Clinician: Referring Jernard Reiber: Treating Gerlene Glassburn/Extender:Robson, Esperanza Richters in Treatment: 21 Vital Signs Time Taken: 13:28 Temperature (F): 97.8 Height (in): 73 Pulse (bpm): 73 Weight (lbs): 150 Respiratory Rate (breaths/min): 18 Body Mass Index (BMI): 19.8 Blood Pressure (mmHg): 115/84 Reference Range: 80 - 120 mg / dl Electronic Signature(s) Signed: 01/24/2019 3:04:22 PM By: Carlene Coria RN Entered By: Carlene Coria on 11/03/2018 13:28:36

## 2019-02-13 ENCOUNTER — Ambulatory Visit (HOSPITAL_BASED_OUTPATIENT_CLINIC_OR_DEPARTMENT_OTHER): Payer: Self-pay | Admitting: Orthopaedic Surgery

## 2019-02-13 NOTE — Telephone Encounter (Signed)
-----   Message from Arther Dames sent at 02/13/2019  8:54 AM EST -----  Regarding: FW: antibiotic for dental appt  ----- Message from [facility] sent at 02/13/2019  8:43 AM EST -----  Pt calling in to see if he would still need to take an antibiotic prior to a dental appt he has on 1.5.2021.   Stating that he had a replacement in 2015 with Dr.Klein.   If so he would like it called in to  Preferred Pharmacy     [id number] [place], [place]   [address] [place] [address]   Phone: [phone]: [phone]   Open 24 hours   Thank you!

## 2019-02-13 NOTE — Telephone Encounter (Signed)
Amoxicillin 2 gm (500 mg x 4) to be taken one hour prior to dental work x 3 refills called to pharmacy listed.  Patient aware.

## 2019-02-16 ENCOUNTER — Ambulatory Visit (HOSPITAL_COMMUNITY)
Admission: RE | Admit: 2019-02-16 | Payer: Managed Care, Other (non HMO) | Source: Ambulatory Visit | Admitting: Nuclear Radiology

## 2019-03-17 ENCOUNTER — Other Ambulatory Visit: Payer: Self-pay

## 2019-03-17 ENCOUNTER — Ambulatory Visit: Payer: 59 | Attending: Family Medicine

## 2019-03-17 DIAGNOSIS — R7309 Other abnormal glucose: Secondary | ICD-10-CM | POA: Insufficient documentation

## 2019-03-17 LAB — HGA1C (HEMOGLOBIN A1C WITH EST AVG GLUCOSE)
ESTIMATED AVERAGE GLUCOSE: 123 mg/dL
HEMOGLOBIN A1C: 5.9 % — ABNORMAL HIGH (ref 4.0–5.6)

## 2019-03-24 ENCOUNTER — Encounter (HOSPITAL_BASED_OUTPATIENT_CLINIC_OR_DEPARTMENT_OTHER): Payer: Self-pay | Admitting: Family Medicine

## 2019-03-27 ENCOUNTER — Other Ambulatory Visit (HOSPITAL_BASED_OUTPATIENT_CLINIC_OR_DEPARTMENT_OTHER): Payer: Self-pay

## 2019-03-27 DIAGNOSIS — M549 Dorsalgia, unspecified: Secondary | ICD-10-CM

## 2019-03-28 ENCOUNTER — Telehealth (HOSPITAL_BASED_OUTPATIENT_CLINIC_OR_DEPARTMENT_OTHER): Payer: Self-pay

## 2019-03-28 NOTE — Telephone Encounter (Signed)
PT orders printed and mailed to home address.   Wayland Salinas, RN

## 2019-03-28 NOTE — Telephone Encounter (Signed)
-----   Message from Loletta Parish, MD sent at 03/27/2019  2:13 PM EST -----  Hi,    Can someone please send the updated PT form to [facility] for this patient?  Thanks!  Henry [name]

## 2019-03-31 ENCOUNTER — Encounter (HOSPITAL_BASED_OUTPATIENT_CLINIC_OR_DEPARTMENT_OTHER): Payer: Self-pay | Admitting: Family Medicine

## 2019-04-03 NOTE — Telephone Encounter (Signed)
Regarding: Loletta Parish, MD, usually sees Dr Raleigh Callas, letter and test results  ----- Message from Marzetta Board sent at 04/03/2019  9:48 AM EST -----  Loletta Parish, MD    Patient called- a letter was given for PT for [facility], he is requesting it be for Physical  Fitness, not PT. It goes to [employer] [facility]- [email or web address] This is approval for him to continue Fitness Work Outs.   He is also asking about the results of his HBA1C. He said his HBA1C is going down and asking if needs appointment or if he can continue what he is doing.      Thank you,  Marzetta Board

## 2019-04-04 ENCOUNTER — Encounter (HOSPITAL_BASED_OUTPATIENT_CLINIC_OR_DEPARTMENT_OTHER): Payer: Self-pay

## 2019-05-26 ENCOUNTER — Ambulatory Visit (HOSPITAL_COMMUNITY): Payer: 59 | Admitting: Radiation Oncology

## 2019-05-26 ENCOUNTER — Ambulatory Visit (HOSPITAL_COMMUNITY): Payer: 59

## 2019-06-08 ENCOUNTER — Other Ambulatory Visit: Payer: Self-pay

## 2019-06-08 ENCOUNTER — Inpatient Hospital Stay
Admission: RE | Admit: 2019-06-08 | Discharge: 2019-06-08 | Disposition: A | Discharge status: Still In Hospital | Payer: 59 | Source: Ambulatory Visit | Attending: Radiation Oncology | Admitting: Radiation Oncology

## 2019-06-08 ENCOUNTER — Encounter (HOSPITAL_BASED_OUTPATIENT_CLINIC_OR_DEPARTMENT_OTHER): Payer: Self-pay | Admitting: Radiation Oncology

## 2019-06-08 ENCOUNTER — Inpatient Hospital Stay: Admit: 2019-06-08 | Discharge: 2019-06-08 | Disposition: A | Discharge status: Still In Hospital | Payer: 59

## 2019-06-08 VITALS — BP 154/82 | HR 60 | Temp 96.8°F | Wt 295.9 lb

## 2019-06-08 DIAGNOSIS — Z08 Encounter for follow-up examination after completed treatment for malignant neoplasm: Secondary | ICD-10-CM

## 2019-06-08 DIAGNOSIS — Z923 Personal history of irradiation: Secondary | ICD-10-CM

## 2019-06-08 DIAGNOSIS — C61 Malignant neoplasm of prostate: Secondary | ICD-10-CM

## 2019-06-08 LAB — PSA, DIAGNOSTIC: PSA: 0.33 ng/mL (ref <=4.00)

## 2019-06-08 MED ORDER — SILDENAFIL 25 MG TABLET
25.00 mg | ORAL_TABLET | ORAL | 0 refills | Status: AC | PRN
Start: 2019-06-08 — End: 2019-07-08

## 2019-06-08 NOTE — Nurses Notes (Signed)
Pt to radiation oncology for follow up after radiation tx to the prostate.  Weight, VS and pain per flowsheet. Home medications and allergies reviewed, functional health completed. PSA drawn and walked to the lab.  Dr. Vikki Ports and Dr. Marcial Pacas  in to see the pt. Henry [name]

## 2019-06-08 NOTE — Cancer Center Note (Signed)
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 He denies any urinary urgency, frequency, hematuria, hematochezia, diarrhea, fevers, chills, or weight loss. He has difficulty achieving an erection and is interested in medication. His last PSA draw in October 2020 was 0.4ng/ml. Will be due for a repeat PSA today.    Medications:  Current Outpatient Medications   Medication Sig    atorvastatin (LIPITOR) 40 mg Oral Tablet Take 1 Tab (40 mg total) by mouth Every evening    diclofenac sodium (VOLTAREN) 75 mg Oral Tablet, Delayed Release (E.C.) TAKE 1 TAB (75 MG TOTAL) BY MOUTH TWICE DAILY    dicyclomine (BENTYL) 20 mg Oral Tablet Take 1 Tab (20 mg total) by mouth Four times a day    Ferrous Sulfate (SLOW RELEASE IRON) 142 mg (45 mg iron) Oral Tablet Sustained Release Take 1 Tab (142 mg total) by mouth Three times a day as needed (Patient taking differently: Take 1 Tab by mouth Once a day )    GLUCOS CHOND CPLX ADVANCED ORAL Take 2 Tabs by mouth Once a day Glucosamine-Chondroitin 1500 mg    metoprolol tartrate (LOPRESSOR) 50 mg Oral Tablet Take 1 Tab (50 mg total) by mouth Twice daily    metroNIDAZOLE (METROGEL) 1 % Gel with Pump by Apply externally route Once a day    multivitamin Oral Tablet Take 1 Tab by mouth Once a day    Non-Formulary/Special Preparation (MEDICATION HELP) CBD Oil 13-15 drops daily    Omeprazole 20 mg Oral Tablet, Delayed Release (E.C.) Take by mouth Monday  Wednesday Friday    traMADol (ULTRAM) 50 mg Oral Tablet Take 1 Tab (50 mg total) by mouth Every 6 hours as needed for Pain (Patient not taking: Reported on 12/21/2017)       Allergies   Allergen Reactions    Sulfa (Sulfonamides) Rash    Shellfish Containing Products Itching and Swelling     Shrimp - eye swelling, itching       Review of Systems:  ROS is negative except as noted in HPI.       Objective:     Vitals:    08/25/18 0827   BP: (!) 173/93   Pulse: 66   Temp: 36.7 C (98.1 F)   SpO2: 97%   Weight: 136 kg (298 lb 15.1 oz)        Overall Pain Rating [facility]: 4  CONSTITUTIONAL: No acute distress, KPS 90, ECOG 1  EYES: conjunctiva clear, pupils equal round and reactive to light  ENT: Normocephalic, atraumatic, moist oral cavity mucous membranes  NECK: No masses or palpable lymphadenopathy  RESPIRATORY: Clear to auscultation bilaterally  CARDIOVASCULAR: Regular rate and rhythm, no murmurs appreciated.  GASTROINTESTINAL: Soft, non-tender, non-distended   GENITOURINARY: DRE revealed normal and smooth prostate with no nodularity.  MUSCULOSKELETAL:  No tenderness to palpation in joints or spine.    NEUROLOGIC: AOx3, cranial nerves  II-XII grossly intact, grossly normal motor and sensory exams  LYMPHATICS: No palpable lymphadenopathy  PSYCHIATRIC: Pleasant, normal affect    LABS/IMAGING: All relevant labs and imaging were reviewed as per HPI.     Assessment/Recommendations:     Impression:  64 y.o. male withint risk prostate cancer (T1c, GS3+4, PSA 5.5) s/p definitive RT + 6 mo ADT (enrolled in RTOG 0924). There is no clinical evidence of disease recurrence. Will draw PSA today.    Plan:  1) Draw PSA today. If it remains low with no evidence of biochemical recurrence then we will have him follow-up in 6 months with repeat PSA and DRE.    Herbert Moors, MD  06/08/2019, 09:40  PGY-5 Radiation Oncology    I reviewed the resident's note, and agree with the findings and plan of care as documented in the resident's  note.  Any exceptions/additions have been edited/noted. I personally saw and examined the patient, and reviewed all prior imaging and pathologic findings with him. On the day of the encounter, a total of (35) minutes was spent on this patient encounter including review of historical information, examination, documentation and post-visit activities.    Tomi Likens, DO, MBA, MS  Assistant Professor, [place]

## 2019-06-22 NOTE — Research Notes (Signed)
Clinical Trial:  [id number]   Study ID: [id number]   36 month Follow Up    Patient is followed up with radiation oncologist every 6 months. His last visit was 11/25/2018. Today's visit is counted as 36 month F/U for RTOG 0924.  PSA 0.33. DRE performed by the physician.   Patient stated overall he is doing well. Denies urinary urgency, frequency, hematuria, diarrhea/constipation, N/V, chest pain or SOB. ECOG 1    CONCOMITANT MEDICATIONS    Indication Medication Dose/Frequency Start Date Stop Date Dose Modification & reason   Hyperlipidemia atorvastatin (LIPITOR) 40 mg Oral Tablet Take 1 Tab (40 mg total) by mouth Every evening        Pain and arthritis diclofenac sodium (VOLTAREN) 75 mg Oral Tablet, Delayed Release (E.C.) TAKE 1 TAB (75 MG TOTAL) BY MOUTH TWICE DAILY      Treat IBS dicyclomine (BENTYL) 20 mg Oral Tablet Take 1 Tab (20 mg total) by mouth Four times a day      Treat for iron deficiency Ferrous Sulfate (SLOW RELEASE IRON) 142 mg (45 mg iron) Oral Tablet Sustained Release Take 1 Tab (142 mg total) by mouth Three times a day as needed (Patient taking differently: Take 1 Tab by mouth Once a day )      Supplement for joint health GLUCOS CHOND CPLX ADVANCED ORAL Take 2 Tabs by mouth Once a day Glucosamine-Chondroitin 1500 mg      HTN metoprolol tartrate (LOPRESSOR) 50 mg Oral Tablet Take 1 Tab (50 mg total) by mouth Twice daily      Treat for rosacea  metroNIDAZOLE (METROGEL) 1 % Gel with Pump by Apply externally route Once a day      supplement multivitamin Oral Tablet Take 1 Tab by mouth Once a day      Reduce inflammation and pain Non-Formulary/Special Preparation (MEDICATION HELP) CBD Oil 13-15 drops daily      antiacid Omeprazole 20 mg Oral Tablet, Delayed Release (E.C.) Take by mouth Monday Wednesday Friday      analgesic traMADol (ULTRAM) 50 mg Oral Tablet Take 1 Tab (50 mg total) by mouth Every 6 hours as needed for Pain (Patient not taking: Reported on 12/21/2017)      antifungal Lotrimin spray (OTC)   11/1/  2020       ADVERSE EVENT LOG      Subject Name:   [facility]          Subject Study ID:  [id number]   STUDY NAME:  RTOG 0924           Cycle:  30 month follow up Date:  08/25/2018-06/07/2019 Protocol Therapy Regimen:  Arm 2   Attribution: UR = Unrelated; UL = Unlikely Related; PO = Possibly Related; PR = Probably Related; D = Definitely Related (Underlined attributions defined above correspond with SUSPECTED, non-underlined are UNSUSPECTED.     Action taken with Protocol Therapy: 1 = None; 2 = Delayed Dose; 3 = Reduced Dose; 4 = Temporary Discontinuation; 5 = Permanent Discontinuation; 6 = Other   Adverse Event:                                                                      CTCAE Version: 4.0 Start Date Stop Date Grade Attribution  Action taken with Protocol Therapy Intervention/Comments:       radiation therapy Casodex Other/ Disease                                     L hip pain Apr-18 cont. 1 UR UR UR 1 Being evaluated by ortho-determined to be sciatica   skin infection ( groin & scrotum area)  Nov-20  1     He has jock itch 5 month ago which was treated with OTC antifungle spray, not well controlled. Will see PCP for this issue.    Erection disfunction   1                      [facility] took over this trial from other coordinator who left this unit. Just being aware that patients in this trial are in follow-up instead of survival follow up. Will keep up with their future visits. Patient's next visit will be in October, 2021.     [place] Nurse

## 2019-06-29 ENCOUNTER — Encounter (HOSPITAL_BASED_OUTPATIENT_CLINIC_OR_DEPARTMENT_OTHER): Payer: Self-pay

## 2019-09-23 ENCOUNTER — Other Ambulatory Visit (HOSPITAL_BASED_OUTPATIENT_CLINIC_OR_DEPARTMENT_OTHER): Payer: Self-pay

## 2019-09-27 ENCOUNTER — Other Ambulatory Visit (HOSPITAL_BASED_OUTPATIENT_CLINIC_OR_DEPARTMENT_OTHER): Payer: Self-pay

## 2019-10-20 ENCOUNTER — Ambulatory Visit (HOSPITAL_BASED_OUTPATIENT_CLINIC_OR_DEPARTMENT_OTHER): Payer: Self-pay | Admitting: Family Medicine

## 2019-10-20 NOTE — Telephone Encounter (Signed)
Regarding: [place] Pt  \\  blood/lab work  ----- Message from Ben Wheeler Ever sent at 10/19/2019  5:02 PM EDT -----  Rosemarie Beath, MD    Pt has upcoming yearly appointment on 14Sep2021 and the Pt wants to know if the doctor wants to submit an order for blood/lab work before the appointment.  Please advise.    Thank you so much for all your time and assistance!  Have a great day!!!  --Estill Bamberg

## 2019-10-24 ENCOUNTER — Encounter (HOSPITAL_BASED_OUTPATIENT_CLINIC_OR_DEPARTMENT_OTHER): Payer: Self-pay | Admitting: NURSE PRACTITIONER

## 2019-10-24 ENCOUNTER — Ambulatory Visit (HOSPITAL_BASED_OUTPATIENT_CLINIC_OR_DEPARTMENT_OTHER): Payer: 59 | Admitting: NURSE PRACTITIONER

## 2019-10-24 ENCOUNTER — Other Ambulatory Visit (HOSPITAL_BASED_OUTPATIENT_CLINIC_OR_DEPARTMENT_OTHER): Payer: Self-pay | Admitting: Family Medicine

## 2019-10-24 ENCOUNTER — Other Ambulatory Visit: Payer: Self-pay

## 2019-10-24 VITALS — BP 162/86 | HR 63 | Temp 97.9°F | Resp 14 | Ht 69.0 in | Wt 294.5 lb

## 2019-10-24 DIAGNOSIS — C61 Malignant neoplasm of prostate: Secondary | ICD-10-CM

## 2019-10-24 DIAGNOSIS — R7302 Impaired glucose tolerance (oral): Secondary | ICD-10-CM

## 2019-10-24 DIAGNOSIS — E785 Hyperlipidemia, unspecified: Secondary | ICD-10-CM

## 2019-10-24 DIAGNOSIS — Z6841 Body Mass Index (BMI) 40.0 and over, adult: Secondary | ICD-10-CM

## 2019-10-24 DIAGNOSIS — I10 Essential (primary) hypertension: Secondary | ICD-10-CM

## 2019-10-24 DIAGNOSIS — G4733 Obstructive sleep apnea (adult) (pediatric): Secondary | ICD-10-CM

## 2019-10-24 NOTE — Progress Notes (Signed)
[facility]    Patient Name: Henry [name]  MRN#: [id number]  DOB: 1955-11-10  Date of Service: 10/24/2019    CC: Sleep Apnea        Last Clinic Visit: 10/25/18    Sleep Disorders Therapy:  Positive Pressure: CPAP 9 cm     Mask:Zest Nasal    Humidifier: Heated    Ramp: Yes    Oxyen: No     Positive Pressure: CPAP 9 cm     Mask:Zest Nasal    Humidifier: Heated    Ramp: Yes    Oxyen: No       HPI: The patient is a64year old male with history of severe sleep apnea presenting for routine follow up.No concerns or complaints. Continues to do well on therapy. He is a short sleeper.     Apnea Hypopnea index: 53.8 Desat 63%(01/23/10)    Current use of positive pressure therapy: every night    Hours of sleep per night: approx 5 hr to 6 hours a night     Problems with Therapy    Mask: None     Machine: None    Nasal: None    Epworth Sleepiness Scale:5     Sleep related symptoms:  Excessive day time sleepiness: Denies   Nonrestorative sleep: Denies  Witnessed apneas by bed partner: Denies   Awakening with choking: Denies   Nocturnal restlessness: Denies  Insomnia with frequent awakenings: Denies   Lack of concentration: Denies   Cognitive deficits: Denies   Changes in mood: Denies   Morning headaches: Denies   Vivid, strange, or threatening dreams Denies    Sleep structure:  - sleep latency 5 min, sleep maintenance 5 hours, Frequent night time awakening none  - excessive caffeine, soda, alcohol intake: No. Decaffeinated only    Associated diseases: Obesity (Body mass index is 43.5 kg/m.)  Systemic complications:   Hypertension,   Cardiovascular disease,    PAP compliance report:(07/26/19-10/23/19)  DME: [facility]  CPAP   Setting: 9  cm of H2O  Average usage (Days Used): 5 hours 34 mins 1 secs  % of sleep >=4 hours use: 95.6  Avg air leak time: 0   Residual AHI: 0.5     PAST MEDICAL HISTORY:   Past Medical History:   Diagnosis Date    . Cancer (CMS [facility]) 11/2015    prostate cancer   . Chronic pain    . CPAP (continuous positive airway pressure) dependence     settings unknown   . Deep vein thrombosis (DVT) (CMS [facility])     left leg   . GERD (gastroesophageal reflux disease)     well controlled on omeprazole   . Heartburn    . HTN    . Hyperlipidemia    . Hypertension    . Irritable bowel syndrome    . Neck problem    . Osteoarthritis of back    . Prostate cancer (CMS [place]) 12/11/2015   . Sleep apnea    . Wears glasses            ALLERGIES:   Allergies   Allergen Reactions   . Sulfa (Sulfonamides) Rash   . Shellfish Containing Products Itching and Swelling     Shrimp - eye swelling, itching         CURRENT MEDICATIONS:   Prior to Admission medications    Medication Sig Start Date End Date Taking? Authorizing Provider   Acetaminophen 500 mg Oral Capsule Take  1 Cap (500 mg total) by mouth Every 6 hours as needed 10/07/18   Ricke Hey, MD   atorvastatin (LIPITOR) 40 mg Oral Tablet Take 1 Tab (40 mg total) by mouth Every evening 11/21/18   Loletta Parish, MD   diclofenac sodium (VOLTAREN) 75 mg Oral Tablet, Delayed Release (E.C.) Take 1 Tab (75 mg total) by mouth Twice daily 11/21/18   Loletta Parish, MD   dicyclomine (BENTYL) 20 mg Oral Tablet Take 1 Tablet (20 mg total) by mouth Four times a day 09/29/19   Rosemarie Beath, MD   Ferrous Sulfate (SLOW RELEASE IRON) 142 mg (45 mg iron) Oral Tablet Sustained Release Take 1 Tab (142 mg total) by mouth Three times a day as needed  Patient taking differently: Take 1 Tab by mouth Once a day  04/26/14   Rosemarie Beath, MD   GLUCOS CHOND CPLX ADVANCED ORAL Take 2 Tabs by mouth Once a day Glucosamine-Chondroitin 1500 mg    Provider, Historical   metoprolol tartrate (LOPRESSOR) 50 mg Oral Tablet Take 1 Tab (50 mg total) by mouth Twice daily 09/19/18   Loletta Parish, MD   metroNIDAZOLE (METROGEL) 1 % Gel with Pump by Apply externally route Once a day    Provider, Historical   multivitamin Oral Tablet Take 1 Tab by mouth  Once a day    Provider, Historical   Non-Formulary/Special Preparation (MEDICATION HELP) CBD Oil 13-15 drops daily    Provider, Historical   Omeprazole 20 mg Oral Tablet, Delayed Release (E.C.) Take by mouth Monday Wednesday Friday    Provider, Historical       REVIEW OF SYSTEMS:   Constitutional: negative  Eyes: negative  Ears, nose, mouth, throat, and face: negative  Respiratory: obstructive sleep apnea  Cardiovascular: negative  Gastrointestinal: negative  Genitourinary:negative  Integument/breast: negative  Hematologic/lymphatic: negative  Musculoskeletal:negative  Neurological: negative  Behavioral/Psych: negative  Endocrine: negative  Allergic/Immunologic: negative          PHYSICAL EXAM: The patient's current vitals are BP (!) 162/86   Pulse 63   Temp 36.6 C (97.9 F)   Resp 14   Ht 1.753 m (5' 9" )   Wt 134 kg (294 lb 8.6 oz)   SpO2 94%   BMI 43.50 kg/m   General: NAD, good appearance   Eyes: Conjunctiva clear, EOMI.   Oropharynx: Oropharynx Mallampati Class I  Neck: 19Inches @ cirsothyroid membrane and neck supple  Lungs: clear to auscultation bilaterally  Cardiovascular: S1, S2, RRR without murmur  Abdomen: soft, non-tender, bowel sounds normal and non-tender   Extremities: no cyanosis, clubbing or edema  Skin: Skin warm and dry  Neurologic: grossly normal, AO x 3  Lymphatics: No nodes appreciated      ASSESSMENT/ PLAN:   Henry [name] is a 64 y.o. Male  with hx of   1. 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 He was advised to change his work out regimen and sleep hygiene to assist with increasing sleep.   *Good response and benefit of therapy  *Apnea well controlled.   *Continue to do well on positive airway pressure therapy and excellent subjective and objectivecompliance.  *Advise obtain replacement equipment every three to six months.   *Advised patient do  not drive or operate heavy equipment or machinery if fatigued, drowsy or sleepy due to risk of injury.  2. Morbid obesity- fitness and healthy eating habits encouraged      RTC: one year     Lorette Ang, APRN, FNP-BC  10/24/2019, 10:22        Gerrie Nordmann. Wilson-Carr, APRN, FNP-BC  Family Nurse Practitioner  [facility]  Pulmonary and Sleep Medicine.  Department of Medicine

## 2019-10-26 ENCOUNTER — Ambulatory Visit (HOSPITAL_BASED_OUTPATIENT_CLINIC_OR_DEPARTMENT_OTHER): Payer: Self-pay

## 2019-10-26 NOTE — Telephone Encounter (Signed)
Script faxed to  DME: [facility]  Pap settings:cpap 9  Mask:zest nasal   heated humidity   Replacement cushion or liner, water chamber, filters(disposable and non disposable), tubing and headgear   Kathlene Cote, Ambulatory Care Assistant  10/26/2019, 15:10

## 2019-10-27 ENCOUNTER — Ambulatory Visit: Payer: 59 | Attending: Family Medicine

## 2019-10-27 ENCOUNTER — Other Ambulatory Visit: Payer: Self-pay

## 2019-10-27 DIAGNOSIS — R7302 Impaired glucose tolerance (oral): Secondary | ICD-10-CM | POA: Insufficient documentation

## 2019-10-27 DIAGNOSIS — R7309 Other abnormal glucose: Secondary | ICD-10-CM

## 2019-10-27 DIAGNOSIS — E785 Hyperlipidemia, unspecified: Secondary | ICD-10-CM | POA: Insufficient documentation

## 2019-10-27 DIAGNOSIS — I10 Essential (primary) hypertension: Secondary | ICD-10-CM

## 2019-10-27 DIAGNOSIS — C61 Malignant neoplasm of prostate: Secondary | ICD-10-CM | POA: Insufficient documentation

## 2019-10-27 LAB — COMPREHENSIVE METABOLIC PNL, FASTING
ALBUMIN: 4.1 g/dL (ref 3.4–4.8)
ALKALINE PHOSPHATASE: 78 U/L (ref 45–115)
ALT (SGPT): 49 U/L (ref 10–55)
ANION GAP: 10 mmol/L (ref 4–13)
AST (SGOT): 33 U/L (ref 8–45)
BILIRUBIN TOTAL: 0.8 mg/dL (ref 0.3–1.3)
BUN/CREA RATIO: 18 (ref 6–22)
BUN: 15 mg/dL (ref 8–25)
CALCIUM: 9.2 mg/dL (ref 8.8–10.2)
CHLORIDE: 101 mmol/L (ref 96–111)
CO2 TOTAL: 27 mmol/L (ref 23–31)
CREATININE: 0.85 mg/dL (ref 0.75–1.35)
ESTIMATED GFR: >90 mL/min/BSA (ref >=60)
GLUCOSE: 132 mg/dL — ABNORMAL HIGH (ref 70–99)
POTASSIUM: 4.6 mmol/L (ref 3.5–5.1)
PROTEIN TOTAL: 6.8 g/dL (ref 6.0–8.0)
SODIUM: 138 mmol/L (ref 136–145)

## 2019-10-27 LAB — LIPID PANEL
CHOL/HDL RATIO: 3.9
CHOLESTEROL: 141 mg/dL (ref 100–200)
HDL CHOL: 36 mg/dL — ABNORMAL LOW (ref >=50)
LDL CALC: 63 mg/dL (ref <100)
NON-HDL: 105 mg/dL (ref <=190)
TRIGLYCERIDES: 212 mg/dL — ABNORMAL HIGH (ref <150)
VLDL CALC: 42 mg/dL — ABNORMAL HIGH (ref <30)

## 2019-10-27 LAB — PSA DIAGNOSTIC WITH FREE PSA REFLEX: PSA: 0.37 ng/mL (ref <=4.00)

## 2019-10-28 LAB — HGA1C (HEMOGLOBIN A1C WITH EST AVG GLUCOSE)
ESTIMATED AVERAGE GLUCOSE: 128 mg/dL
HEMOGLOBIN A1C: 6.1 % — ABNORMAL HIGH (ref 4.0–5.6)

## 2019-10-31 ENCOUNTER — Other Ambulatory Visit: Payer: Self-pay

## 2019-10-31 ENCOUNTER — Encounter (HOSPITAL_BASED_OUTPATIENT_CLINIC_OR_DEPARTMENT_OTHER): Payer: Self-pay | Admitting: Family Medicine

## 2019-10-31 ENCOUNTER — Ambulatory Visit: Payer: 59 | Attending: Family Medicine | Admitting: Family Medicine

## 2019-10-31 VITALS — BP 140/86 | HR 79 | Temp 97.3°F | Resp 16 | Ht 68.27 in | Wt 290.3 lb

## 2019-10-31 DIAGNOSIS — R7303 Prediabetes: Secondary | ICD-10-CM

## 2019-10-31 DIAGNOSIS — D649 Anemia, unspecified: Secondary | ICD-10-CM

## 2019-10-31 DIAGNOSIS — I10 Essential (primary) hypertension: Secondary | ICD-10-CM | POA: Insufficient documentation

## 2019-10-31 DIAGNOSIS — E785 Hyperlipidemia, unspecified: Secondary | ICD-10-CM

## 2019-10-31 DIAGNOSIS — B354 Tinea corporis: Secondary | ICD-10-CM

## 2019-10-31 MED ORDER — CICLOPIROX 0.77 % TOPICAL CREAM
TOPICAL_CREAM | Freq: Two times a day (BID) | CUTANEOUS | 1 refills | Status: AC
Start: 2019-10-31 — End: 2020-01-29

## 2019-10-31 MED ORDER — LISINOPRIL 10 MG TABLET
10.0000 mg | ORAL_TABLET | Freq: Every day | ORAL | 4 refills | Status: DC
Start: 2019-10-31 — End: 2020-03-14

## 2019-10-31 NOTE — Progress Notes (Addendum)
Department of Family Medicine   Progress Note    Henry [name]  MRN: [id number]  DOB: 1955-06-04  Date of Service: 10/31/2019    CHIEF COMPLAINT  Chief Complaint   Patient presents with   . Annual Exam   . Jock Itch     miconazole nitrate 2% OTC daily      HISTORY OF PRESENT ILLNESS  Henry [name] is a 64 y.o. male presenting to clinic to follow up for diabetes.    Has seen labwork prior to visit and is disappointed in increase in A1C from 5.9 to 6.1. States he has improved his home exercise regimen to 1.5 hours/day, 4 days a week, beginning about a year ago. He feels he struggles primarily with diet: "I like to eat!". He has not been on metformin before. He is also concerned by his lipid panel, which has mildly worsened across metrics since last visit. He is on a statin.    Only complaint today is of recurring jock itch for the past 10 months. He has been using a miconazole nitrate spray to good effect, though notes that if he misses a spray his symptoms inevitably flare. He is requesting additional treatment.    BP 152/9mHg today, repeat 140/841mg. States his blood pressure frequently runs higher; chart review corroborates this. Has been on Lopressor 50 mg BID for at least the past 12 years.    Patient also inquiring as to longstanding iron supplementation. Per chart review has anemic to 9.x after hip replacement in 2015. Iron studies also low. Has been on oral iron since but is wondering if he can discontinue this. He feels the supplement has induced constipation which has worsened recently, requiring "more Metamucil than it used to".     PMH, PSH, allergies, medications, immunizations, family history, social history reviewed and updated this visit as appropriate.     PAST MEDICAL HISTORY  Past Medical History:   Diagnosis Date   [facility]    [facility]   . Chronic pain    . CPAP (continuous positive airway pressure) dependence     settings unknown   . Deep vein thrombosis (DVT)  (CMS [facility])     left leg   . GERD (gastroesophageal reflux disease)     well controlled on omeprazole   . Heartburn    . HTN    . Hyperlipidemia    . Hypertension    . Irritable bowel syndrome    . Neck problem    . Osteoarthritis of back    . [facility]   . Sleep apnea    . Wears glasses      MEDICATIONS  Acetaminophen 500 mg Oral Capsule, Take 1 Cap (500 mg total) by mouth Every 6 hours as needed  atorvastatin (LIPITOR) 40 mg Oral Tablet, Take 1 Tab (40 mg total) by mouth Every evening  diclofenac sodium (VOLTAREN) 75 mg Oral Tablet, Delayed Release (E.C.), Take 1 Tab (75 mg total) by mouth Twice daily  dicyclomine (BENTYL) 20 mg Oral Tablet, Take 1 Tablet (20 mg total) by mouth Four times a day  Ferrous Sulfate (SLOW RELEASE IRON) 142 mg (45 mg iron) Oral Tablet Sustained Release, Take 1 Tab (142 mg total) by mouth Three times a day as needed (Patient taking differently: Take 1 Tablet by mouth Patient takes this 5x weekly )  GLUCOS CHOND CPLX ADVANCED ORAL, Take 2 Tabs by mouth Once a day Glucosamine-Chondroitin 1500 mg  metoprolol tartrate (LOPRESSOR) 50 mg Oral Tablet, Take 1 Tab (50 mg total) by mouth Twice daily  metroNIDAZOLE (METROGEL) 1 % Gel with Pump, by Apply externally route Once a day  multivitamin Oral Tablet, Take 1 Tab by mouth Once a day  Non-Formulary/Special Preparation (MEDICATION HELP), CBD 2 capsules per day   Omeprazole 20 mg Oral Tablet, Delayed Release (E.C.), Take by mouth Monday Wednesday Friday    No facility-administered medications prior to visit.    ALLERGIES  Allergies   Allergen Reactions   . Sulfa (Sulfonamides) Rash   . Shellfish Containing Products Itching and Swelling     Shrimp - eye swelling, itching     PAST SURGICAL HISTORY  Past Surgical History:   Procedure Laterality Date   . HX ANKLE FRACTURE TX Right    . HX APPENDECTOMY     . 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 Caryl Comes   . PB COLONOSCOPY,DIAGNOSTIC       IMMUNIZATIONS  Immunization History    Administered Date(s) Administered   . Covid-19 Vaccine,Pfizer-BioNTech 04/18/2019, 05/06/2019   . Diptheria & Tetanus Toxoid,Adsorbed, (Decavac/Tenivac) 26YRS & OLDER (Admin) 04/15/1999   . Diptheria/Tetanus,Adsorbed (ADMIN) 01/30/2010   . INFLUENZA VIRUS VACCINE (ADMIN) 01/23/2002, 01/02/2006, 10/30/2010   . Influenza Vaccine, 6 month-adult 01/17/2014, 12/18/2014, 11/24/2016, 12/21/2017   . Shingrix - Zoster Vaccine (Admin) 08/27/2016, 10/19/2017     FAMILY HISTORY  Family Medical History:       Problem Relation (Age of Onset)    Asthma Father    COPD Father    Cancer Mother, Father    Coronary Artery Disease Mother    Diabetes Mother    High Cholesterol Mother    Hypertension (High Blood Pressure) Mother    Migraines Mother          SOCIAL HISTORY  Social History     Tobacco Use   . Smoking status: Former Smoker     Types: Cigars     Quit date: 2012     Years since quitting: 9.7   . Smokeless tobacco: Never Used   . Tobacco comment: very rare cigar   Vaping Use   . Vaping Use: Never used   Substance Use Topics   . Alcohol use: Yes     Alcohol/week: 2.0 standard drinks     Types: 2 Cans of beer per week     Comment: per week   . Drug use: No     Comment: CBD oil, 13-15 drops daily      REVIEW OF SYSTEMS  Constitutional: negative for fevers, chills  Eyes: negative for vision changes  Ears, nose, mouth, throat, and face: negative for earaches, nasal congestion, sore throat  Respiratory: negative for cough, shortness of breath  Cardiovascular: negative for chest pain, palpitations  Gastrointestinal: negative for nausea, vomiting, diarrhea. Positive for chronic constipation  Genitourinary: negative for dysuria  Integument: positive for chronic jock itch, negative for rash  Musculoskeletal: negative for joint pain  Neurological: negative for headaches, numbness/tingling  Behavioral/Psych: negative for anxiety, depression  Endocrine: negative for excessive thirst or urination    PHYSICAL EXAM  Vitals: Blood pressure (!)  140/86, pulse 79, temperature 36.3 C (97.3 F), temperature source Thermal Scan, resp. rate 16, height 1.734 m (5' 8.27"), weight 132 kg (290 lb 5.5 oz), SpO2 97 %. Body mass index is 43.8 kg/m.  General: appears stated age, no acute distress  HEENT: conjunctiva clear; pupils equal and round; TMs transluscent bilaterally without  erythema, non bulging; mouth mucus membranes moist; pharynx without exudate or erythema  Neck: no thyromegaly or lymphadenopathy appreciated  Cardiovascular: RRR, no murmur, no carotid bruits  Lungs: clear to auscultation bilaterally  Abdomen: soft, non-tender, bowel sounds normal  Genito-urinary: Deferred  Extremities: no cyanosis or edema  Skin: no rashes or lesions  Neurologic: gait normal, CN 2-12 grossly intact, AOx3  Psychiatric: euthymic, normal range, affect-congruent. Pleasant, conversational.     ASSESSMENT AND PLAN    ICD-10-CM    1. Anemia, unspecified type  D64.9 IRON     FERRITIN     IRON TRANSFERRIN AND TIBC     CBC/DIFF   2. Tinea corporis  B35.4 Ciclopirox (LOPROX) 0.77 % Cream   3. Hypertension, unspecified type  I10      (D64.9) Anemia, unspecified type  (primary encounter diagnosis)  Plan: IRON, FERRITIN, IRON TRANSFERRIN AND TIBC,         CBC/DIFF        Anemia noted postoperatively in 2015, apparently since resolved. Reporting constipation with supplement.   Advised patient to trial a discontinuation of iron supplement in interim. Will reassess at next visit.     (B35.4) Tinea corporis  Plan: Ciclopirox (LOPROX) 0.77 % Cream    Hypertension, unspecified type  -Has been on Lopressor 50 mg BID for at least 12 years  -Denies cardiac history  -Amenable to addition of lisinopril 10 mg daily    HLD  -Continue statin    Prediabetes  -Exercising well, motivated, identifies diet as primary barrier  -Has not had A1C over 6.5, have been in high 5's in the past  -Metformin initiation discussed, can consider if A1C continues to worsen (6.1 at most recent check)    Hx prostate  cancer  -S/p radiation, PSA's have been normal, continues to follow with radiation oncology.    Health Maintanence  Wt Readings from Last 3 Encounters:   10/31/19 132 kg (290 lb 5.5 oz)   10/24/19 134 kg (294 lb 8.6 oz)   06/08/19 134 kg (295 lb 13.7 oz)     Dyslipidemia Screening (yearly M 20-35 yo risk, 64 yo+, W 59-45 yo risk, 64 yo+)  Lab Results   Component Value Date    CHOLESTEROL 141 10/27/2019    LDLCHOL 63 10/27/2019    LDLCHOLDIR 65 07/04/2013    TRIG 212 (H) 10/27/2019     Prostate Cancer Screening (every 2+ yrs < 55 if high risk, 8-69 yo)  PROSTATE SPECIFIC ANTIGEN   Lab Results   Component Value Date/Time    PROSSPECAG 0.37 10/27/2019 08:50 AM        Orders Placed This Encounter   . IRON   . FERRITIN   . IRON TRANSFERRIN AND TIBC   . CBC/DIFF   . Ciclopirox (LOPROX) 0.77 % Cream   . lisinopriL (PRINIVIL) 10 mg Oral Tablet     Return in about 3 months (around 01/30/2020).    Trenton Founds, MD (he/him)  10/31/2019, 14:48  Behavioral Medicine & Psychiatry, PGY-1    Prior to the patient's discharge today, I was present and participated in the key portions of service as it was rendered.  I discussed medical decision making with the resident physician before the end of the encounter.    Rosemarie Beath M.D.  Professor  Department of Family Medicine  Operated by [facility]

## 2019-11-14 ENCOUNTER — Other Ambulatory Visit (HOSPITAL_BASED_OUTPATIENT_CLINIC_OR_DEPARTMENT_OTHER): Payer: Self-pay | Admitting: Family Medicine

## 2019-11-14 DIAGNOSIS — I10 Essential (primary) hypertension: Secondary | ICD-10-CM

## 2019-11-14 MED ORDER — METOPROLOL TARTRATE 50 MG TABLET
50.0000 mg | ORAL_TABLET | Freq: Two times a day (BID) | ORAL | 3 refills | Status: DC
Start: 2019-11-14 — End: 2020-06-11

## 2019-11-14 NOTE — Telephone Encounter (Signed)
Regarding: Rx refill   ----- Message from Ladean Raya sent at 11/14/2019  9:27 AM EDT -----  Rosemarie Beath, MD  Pt is requesting a refill on the medicaiton below . Pt aslo would like  Dr whipp removed from his medicaiton list .     metoprolol tartrate (LOPRESSOR) 50 mg Oral Tablet 180 Tab 3 09/19/2018    Sig - Route: Take 1 Tab (50 mg total) by mouth Twice daily - Oral   Sent to pharmacy as: metoprolol tartrate 50 mg tablet (LOPRESSOR)   Non-formulary Exception Code: WNIOE/VO Formulary Info Available     Preferred Pharmacy     [id number] [place]   [address] [place] [address]   Phone: [phone]: [phone]   Hours: Open 24 hours      Thank You RRollene Fare

## 2019-12-14 ENCOUNTER — Other Ambulatory Visit (HOSPITAL_BASED_OUTPATIENT_CLINIC_OR_DEPARTMENT_OTHER): Payer: Self-pay | Admitting: Family Medicine

## 2019-12-14 DIAGNOSIS — M1990 Unspecified osteoarthritis, unspecified site: Secondary | ICD-10-CM

## 2019-12-14 DIAGNOSIS — E785 Hyperlipidemia, unspecified: Secondary | ICD-10-CM

## 2019-12-15 MED ORDER — DICLOFENAC SODIUM 75 MG TABLET,DELAYED RELEASE
75.0000 mg | DELAYED_RELEASE_TABLET | Freq: Two times a day (BID) | ORAL | 3 refills | Status: DC
Start: 2019-12-15 — End: 2020-10-14

## 2020-01-18 ENCOUNTER — Encounter (HOSPITAL_BASED_OUTPATIENT_CLINIC_OR_DEPARTMENT_OTHER): Payer: Self-pay | Admitting: Family Medicine

## 2020-01-20 ENCOUNTER — Other Ambulatory Visit (HOSPITAL_BASED_OUTPATIENT_CLINIC_OR_DEPARTMENT_OTHER): Payer: Self-pay | Admitting: Family Medicine

## 2020-01-20 DIAGNOSIS — B356 Tinea cruris: Secondary | ICD-10-CM

## 2020-01-20 MED ORDER — TERBINAFINE HCL 250 MG TABLET
250.0000 mg | ORAL_TABLET | Freq: Every day | ORAL | 0 refills | Status: AC
Start: 2020-01-20 — End: 2020-02-03

## 2020-02-01 ENCOUNTER — Encounter (HOSPITAL_BASED_OUTPATIENT_CLINIC_OR_DEPARTMENT_OTHER): Payer: Self-pay | Admitting: Family Medicine

## 2020-02-22 ENCOUNTER — Ambulatory Visit (HOSPITAL_BASED_OUTPATIENT_CLINIC_OR_DEPARTMENT_OTHER): Payer: Self-pay | Admitting: Orthopaedic Surgery

## 2020-02-22 NOTE — Telephone Encounter (Addendum)
Amoxicillin 2 gm (500 mg x 4) to be taken one hour prior to dental work x 3 refills called to pharmacy listed.  Patient aware.            ----- Message from Arther Dames sent at 02/22/2020  9:34 AM EST -----  Regarding: FW: refill  ----- Message from Gwyneth Sprout sent at 02/22/2020  9:33 AM EST -----  Caryl Comes PT     Pt calling in to see if an antibiotic could be sent to his pharmacy for him  Stating that he has a dental procedure next week  Preferred Pharmacy     [id number] [place] [place]   [address] [place]  [address]   Phone: [phone]: [phone]   Hours: Open 24 hours     thanky ou!

## 2020-03-04 ENCOUNTER — Other Ambulatory Visit: Payer: Self-pay

## 2020-03-12 ENCOUNTER — Ambulatory Visit: Payer: 59 | Attending: Family Medicine

## 2020-03-12 ENCOUNTER — Other Ambulatory Visit: Payer: Self-pay

## 2020-03-12 DIAGNOSIS — D649 Anemia, unspecified: Secondary | ICD-10-CM | POA: Insufficient documentation

## 2020-03-12 DIAGNOSIS — R7309 Other abnormal glucose: Secondary | ICD-10-CM | POA: Insufficient documentation

## 2020-03-12 LAB — CBC WITH DIFF
BASOPHIL #: <0.1 10*3/uL (ref <=0.20)
BASOPHIL %: 1 %
EOSINOPHIL #: 0.2 10*3/uL (ref <=0.50)
EOSINOPHIL %: 3 %
HCT: 45.9 % (ref 38.9–52.0)
HGB: 15.4 g/dL (ref 13.4–17.5)
IMMATURE GRANULOCYTE #: <0.1 10*3/uL (ref <0.10)
IMMATURE GRANULOCYTE %: 1 % (ref 0–1)
LYMPHOCYTE #: 1.91 10*3/uL (ref 1.00–4.80)
LYMPHOCYTE %: 32 %
MCH: 28.5 pg (ref 26.0–32.0)
MCHC: 33.6 g/dL (ref 31.0–35.5)
MCV: 84.8 fL (ref 78.0–100.0)
MONOCYTE #: 0.4 10*3/uL (ref 0.20–1.10)
MONOCYTE %: 7 %
MPV: 9.1 fL (ref 8.7–12.5)
NEUTROPHIL #: 3.31 10*3/uL (ref 1.50–7.70)
NEUTROPHIL %: 56 %
PLATELETS: 254 10*3/uL (ref 150–400)
RBC: 5.41 10*6/uL (ref 4.50–6.10)
RDW-CV: 12.9 % (ref 11.5–15.5)
WBC: 5.9 10*3/uL (ref 3.7–11.0)

## 2020-03-12 LAB — IRON TRANSFERRIN AND TIBC
IRON (TRANSFERRIN) SATURATION: 30 % (ref 15–50)
IRON: 102 ug/dL (ref 55–175)
TOTAL IRON BINDING CAPACITY: 343 ug/dL (ref 224–476)
TRANSFERRIN: 245 mg/dL (ref 160–340)

## 2020-03-12 LAB — FERRITIN: FERRITIN: 527 ng/mL — ABNORMAL HIGH (ref 20–300)

## 2020-03-13 LAB — HGA1C (HEMOGLOBIN A1C WITH EST AVG GLUCOSE)
ESTIMATED AVERAGE GLUCOSE: 131 mg/dL
HEMOGLOBIN A1C: 6.2 % — ABNORMAL HIGH (ref 4.0–5.6)

## 2020-03-14 ENCOUNTER — Other Ambulatory Visit: Payer: Self-pay

## 2020-03-14 ENCOUNTER — Ambulatory Visit: Payer: 59 | Attending: Family Medicine | Admitting: Family Medicine

## 2020-03-14 DIAGNOSIS — R21 Rash and other nonspecific skin eruption: Secondary | ICD-10-CM | POA: Insufficient documentation

## 2020-03-14 DIAGNOSIS — R7989 Other specified abnormal findings of blood chemistry: Secondary | ICD-10-CM | POA: Insufficient documentation

## 2020-03-14 DIAGNOSIS — M1990 Unspecified osteoarthritis, unspecified site: Secondary | ICD-10-CM | POA: Insufficient documentation

## 2020-03-14 DIAGNOSIS — R7302 Impaired glucose tolerance (oral): Secondary | ICD-10-CM | POA: Insufficient documentation

## 2020-03-14 DIAGNOSIS — R058 Other specified cough: Secondary | ICD-10-CM | POA: Insufficient documentation

## 2020-03-14 DIAGNOSIS — L409 Psoriasis, unspecified: Secondary | ICD-10-CM | POA: Insufficient documentation

## 2020-03-14 DIAGNOSIS — T464X5A Adverse effect of angiotensin-converting-enzyme inhibitors, initial encounter: Secondary | ICD-10-CM | POA: Insufficient documentation

## 2020-03-14 DIAGNOSIS — I10 Essential (primary) hypertension: Secondary | ICD-10-CM | POA: Insufficient documentation

## 2020-03-14 MED ORDER — TRIAMCINOLONE ACETONIDE 0.5 % TOPICAL CREAM
TOPICAL_CREAM | CUTANEOUS | 1 refills | Status: DC
Start: 2020-03-14 — End: 2020-04-23

## 2020-03-14 NOTE — Progress Notes (Signed)
Subjective:     Patient ID:  Henry [name] is an 65 y.o. male   Chief Complaint:    Chief Complaint   Patient presents with    [place] [id number]  Operated by [facility]  Telephone Visit    Name:  Henry [name] MRN: [id number]  Date:  03/14/2020 Age:   65 y.o.    The patient/family initiated a request for telephone service.  Verbal consent for this service was obtained from the patient/family.    Last office visit in this department: 10/31/2019      Reason for call:  Medication review to glucose and blood pressure follow-up also to update me on back and hip pain and arthritis conditions as well as rash on his scrotum which is believed to be psoriasis.  Call notes:  The patient tried a course of topical medication for jock itch which was ineffective and as recommended he was seen by Dermatology they believe the rash is psoriasis and have prescribed triamcinolone 0.5% to be used twice a day for 1 week then once a day for 1 week then off 10 days and if the rash persists to repeat the above regiment and then if the rash still persists to follow-up with dermatology.  Questions were asked and answered regarding his nonproductive cough which could be related to ACE-inhibitor.  We discussed his normal iron profile with the exception of slightly increased ferritin.  He has discontinued his iron supplement a week or so ago and will repeat a ferritin level in about 6 weeks.  The patient upon reviewing his chart had questions regarding pneumococcal vaccine and updated tetanus shot which will be addressed when he has a face-to-face visit in 6 weeks.    ALLERGIES:   -- Sulfa (Sulfonamides) -- Rash   -- Shellfish Containing Products -- Itching and Swelling    --  Shrimp - eye swelling, itching'    Current Outpatient Medications:  Acetaminophen 500 mg Oral Capsule, Take 1 Cap (500 mg total) by mouth Every 6  hours as needed  atorvastatin (LIPITOR) 40 mg Oral Tablet, Take 1 Tablet (40 mg total) by mouth Once a day  diclofenac sodium (VOLTAREN) 75 mg Oral Tablet, Delayed Release (E.C.), Take 1 Tablet (75 mg total) by mouth Twice daily  dicyclomine (BENTYL) 20 mg Oral Tablet, Take 1 Tablet (20 mg total) by mouth Four times a day  Ferrous Sulfate (SLOW RELEASE IRON) 142 mg (45 mg iron) Oral Tablet Sustained Release, Take 1 Tab (142 mg total) by mouth Three times a day as needed (Patient taking differently: Take 1 Tablet by mouth Patient takes this 5x weekly )  GLUCOS CHOND CPLX ADVANCED ORAL, Take 2 Tabs by mouth Once a day Glucosamine-Chondroitin 1500 mg  metoprolol tartrate (LOPRESSOR) 50 mg Oral Tablet, Take 1 Tablet (50 mg total) by mouth Twice daily  metroNIDAZOLE (METROGEL) 1 % Gel with Pump, by Apply externally route Once a day  multivitamin Oral Tablet, Take 1 Tab by mouth Once a day  Non-Formulary/Special Preparation (MEDICATION HELP), CBD 2 capsules per day   Omeprazole 20 mg Oral Tablet, Delayed Release (E.C.), Take by mouth Monday Wednesday Friday  triamcinolone acetonide (ARISTOCORT) 0.5 % Cream, use thin layer to the affected area twice daily for 1 week then once daily for 1 week then discontinue for 10 days if rash persists repeat the above regimen.            (  I10) Hypertension, unspecified type  (primary encounter diagnosis)    (M19.90) Osteoarthritis, unspecified osteoarthritis type, unspecified site    (R73.02) Glucose intolerance (impaired glucose tolerance)    (R21) Rash of genitalia    (R05.8,  T46.4X5A) Cough due to ACE inhibitor      Total provider time spent with the patient on the phone: 20 minutes.    Rosemarie Beath, MD            Review of Systems   Constitutional: Negative.    Respiratory: Negative.    Cardiovascular: Negative.    Musculoskeletal: Positive for arthralgias, back pain, gait problem and myalgias.   Psychiatric/Behavioral: Negative.      Objective:   Physical Exam  Pulmonary:       Effort: Pulmonary effort is normal.   Neurological:      Mental Status: He is alert and oriented to person, place, and time.   Psychiatric:         Mood and Affect: Mood normal.         Behavior: Behavior normal.       Ortho Exam    Assessment & Plan:       ICD-10-CM    1. Hypertension, unspecified type  I10    2. Osteoarthritis, unspecified osteoarthritis type, unspecified site  M19.90    3. Glucose intolerance (impaired glucose tolerance)  R73.02    4. Rash of genitalia  R21    5. Cough due to ACE inhibitor  R05.8     T46.4X5A        (I10) Hypertension, unspecified type  (primary encounter diagnosis)  Plan:  Patient will discontinue lisinopril continue other medications keep track of his blood pressure and see me in 6 weeks for blood pressure check.  I need to know his blood pressures are running higher than 150 over 90.    (M19.90) Osteoarthritis, unspecified osteoarthritis type, unspecified site  Plan:  He will continue his current treatment for osteoarthritis and he has follow-up with Orthopedics.  I have asked that he discuss possible referral to Rheumatology but at this time I do not think that is necessary    (R73.02) Glucose intolerance (impaired glucose tolerance)  Plan:  Elevated hemoglobin A1c not diabetes discuss interventions that he is employing weight reduction calorie count avoidance of foods with high glycemic index will continue to follow    (R21) Rash of genitalia  Plan: triamcinolone acetonide (ARISTOCORT) 0.5 %         Cream        The above medication was prescribed by a dermatologist outside our system I entered it into his med list with the instructions and he will keep follow-up with Dermatology    (R05.8,  T46.4X5A) Cough due to ACE inhibitor  Plan:  Since starting his ACE-inhibitor he has had a nonproductive dry cough that began a week after starting the medication.  His blood pressures at home have been running in the 130s to less than 90 he will discontinue the ACE-inhibitor see me for  a blood pressure check in about 6 weeks at such time will decide whether not he needs additional blood pressure medication    (L40.9) Psoriasis  Plan: triamcinolone acetonide (ARISTOCORT) 0.5 %         Cream        Above medication prescribed by a dermatologist outside this system.  See med list and instructions.    (R79.89) High serum ferritin  Plan: FERRITIN  His low iron condition has resolved his ferritin is a little high will repeat ferritin in about 6 weeks now that he is off iron supplementation.  The elevation could be due to arthritic condition and or iron overload I am we will discuss at follow-up

## 2020-03-26 ENCOUNTER — Other Ambulatory Visit: Payer: Self-pay

## 2020-03-28 ENCOUNTER — Encounter (HOSPITAL_BASED_OUTPATIENT_CLINIC_OR_DEPARTMENT_OTHER): Payer: Self-pay | Admitting: Family Medicine

## 2020-03-29 ENCOUNTER — Encounter (HOSPITAL_BASED_OUTPATIENT_CLINIC_OR_DEPARTMENT_OTHER): Payer: Self-pay | Admitting: Family Medicine

## 2020-03-29 NOTE — Nursing Note (Signed)
Faxed signed Letter dated 03/28/20 to [facility].  Confirmation fax received.   Clarene Essex

## 2020-04-02 ENCOUNTER — Ambulatory Visit (HOSPITAL_BASED_OUTPATIENT_CLINIC_OR_DEPARTMENT_OTHER): Payer: 59 | Admitting: Radiology

## 2020-04-02 ENCOUNTER — Ambulatory Visit
Admission: RE | Admit: 2020-04-02 | Discharge: 2020-04-02 | Disposition: A | Discharge status: Home / Self Care | Payer: 59 | Source: Ambulatory Visit | Attending: Orthopaedic Surgery | Admitting: Orthopaedic Surgery

## 2020-04-02 ENCOUNTER — Other Ambulatory Visit: Payer: Self-pay

## 2020-04-02 ENCOUNTER — Encounter (HOSPITAL_BASED_OUTPATIENT_CLINIC_OR_DEPARTMENT_OTHER): Payer: Self-pay | Admitting: Orthopaedic Surgery

## 2020-04-02 ENCOUNTER — Ambulatory Visit (INDEPENDENT_AMBULATORY_CARE_PROVIDER_SITE_OTHER): Payer: 59

## 2020-04-02 ENCOUNTER — Ambulatory Visit (HOSPITAL_BASED_OUTPATIENT_CLINIC_OR_DEPARTMENT_OTHER): Payer: 59 | Admitting: Orthopaedic Surgery

## 2020-04-02 VITALS — BP 156/98 | HR 63 | Temp 97.5°F | Ht 69.33 in | Wt 285.5 lb

## 2020-04-02 DIAGNOSIS — M13852 Other specified arthritis, left hip: Secondary | ICD-10-CM

## 2020-04-02 DIAGNOSIS — M25551 Pain in right hip: Secondary | ICD-10-CM | POA: Insufficient documentation

## 2020-04-02 DIAGNOSIS — M25552 Pain in left hip: Secondary | ICD-10-CM | POA: Insufficient documentation

## 2020-04-02 DIAGNOSIS — M25562 Pain in left knee: Secondary | ICD-10-CM | POA: Insufficient documentation

## 2020-04-02 DIAGNOSIS — M5410 Radiculopathy, site unspecified: Secondary | ICD-10-CM

## 2020-04-02 DIAGNOSIS — Z96641 Presence of right artificial hip joint: Secondary | ICD-10-CM

## 2020-04-02 DIAGNOSIS — M1712 Unilateral primary osteoarthritis, left knee: Secondary | ICD-10-CM

## 2020-04-02 NOTE — Progress Notes (Signed)
[facility], [place] [address]  Operated by [facility]     Name: Henry [name] MRN:  [id number]   Date: 04/02/2020 Age: 65 y.o.      Chief Complaint   Patient presents with    Hip Pain       The patient returns today for left hip pain.  The patient has had this hip pain for many years.  He had a an intra-articular steroid injection in the left hip in 2019 which helped him for several days of very good relief, however it did not last very long for him.  He also does have a history of low back and left lower extremity radicular pain as well as burning pain around his knee.  He had a right total hip done in 2015 which is still working very well for him with no pain.  He enjoys going on walks with his wife, his left hip pain is limiting his ability to do this now.  His BMI is 41.7, he is nonsmoker, does not have diabetes.    Review of Systems:  The patient denies recent change in health    The ROS is negative except as documented in HPI    The remainder of the review of systems is negative    Exam  BP (!) 156/98 (Site: Manual)   Pulse 63   Temp 36.4 C (97.5 F) (Thermal Scan)   Ht 1.761 m (5' 9.33")   Wt 129 kg (285 lb 7.9 oz)   BMI 41.76 kg/m       General:  well nourished, well developed  Gait:  Antalgic  HEENT:  Head: Normocephalic, no lesions, without obvious abnormality.  Respirations:  Normal/Unlabored  Lower Extremity Motor Strength: grossly normal  Wound:   Right hip incision well healed, no wound over left hip her left knee.  Significant pain in his left posterior hip of flexion and internal rotation of the left hip.  She      X-rays:  Today's images of the left hip reveal moderate OA as evidenced by joint space narrowing, subchondral sclerosis and osteophyte formation.     Impression:     ICD-10-CM    1. Bilateral hip pain  M25.551 XR HIPS BILATERAL-JOINT CENTER    M25.552    2. Left knee pain   M25.562 XR [place] Knee Series,Left   3. Radicular pain of lower extremity  M54.10 Refer to [place]:   He has largely failed conservative therapy. He is willing to try another round of PT. We will also give him a referral to spine to evaluate his low back pain and radicular pain. He was encouraged to try to lose weight to help with his patellofemoral pain as well as to better optimize him for eventual ttoal hip replacement  he will follow up 2 months after doing a substantial amount of PT  Another left hip corticosteroid injection is not likely to provide long lasting pain relief   Weight loss will help with low back pain and will assist the surgical treatment of the left hip  He knows he overeats and that he has to commit to changing his dietary habits  Radiographs needed at next visit:  Left hip series and lumbosacral spine xray series  he was encouraged to call or e-mail with questions or concerns    Gracelyn Nurse, MD  04/02/2020, 10:11  04/03/2020  I saw and examined the patient.  I reviewed the fellow's note.  I agree with the findings and plan of care as documented in the fellow's note.  Any exceptions/additions are edited/noted.    Jacqualine Code, MD      [place], [place]  [place] [id number]    Self, Referral  No address on file

## 2020-04-06 DIAGNOSIS — D709 Neutropenia, unspecified: Secondary | ICD-10-CM | POA: Insufficient documentation

## 2020-04-07 DIAGNOSIS — D649 Anemia, unspecified: Secondary | ICD-10-CM | POA: Insufficient documentation

## 2020-04-07 DIAGNOSIS — M869 Osteomyelitis, unspecified: Secondary | ICD-10-CM | POA: Insufficient documentation

## 2020-04-07 DIAGNOSIS — F0390 Unspecified dementia without behavioral disturbance: Secondary | ICD-10-CM | POA: Insufficient documentation

## 2020-04-07 DIAGNOSIS — F32A Depression, unspecified: Secondary | ICD-10-CM | POA: Insufficient documentation

## 2020-04-07 DIAGNOSIS — Z66 Do not resuscitate: Secondary | ICD-10-CM | POA: Insufficient documentation

## 2020-04-07 DIAGNOSIS — E876 Hypokalemia: Secondary | ICD-10-CM | POA: Insufficient documentation

## 2020-04-07 DIAGNOSIS — E43 Unspecified severe protein-calorie malnutrition: Secondary | ICD-10-CM | POA: Insufficient documentation

## 2020-04-08 DIAGNOSIS — R7989 Other specified abnormal findings of blood chemistry: Secondary | ICD-10-CM | POA: Insufficient documentation

## 2020-04-08 DIAGNOSIS — R079 Chest pain, unspecified: Secondary | ICD-10-CM | POA: Insufficient documentation

## 2020-04-23 ENCOUNTER — Ambulatory Visit: Payer: 59 | Attending: PHYSICIAN ASSISTANT | Admitting: PHYSICIAN ASSISTANT

## 2020-04-23 ENCOUNTER — Encounter (INDEPENDENT_AMBULATORY_CARE_PROVIDER_SITE_OTHER): Payer: Self-pay | Admitting: PHYSICIAN ASSISTANT

## 2020-04-23 ENCOUNTER — Ambulatory Visit (INDEPENDENT_AMBULATORY_CARE_PROVIDER_SITE_OTHER): Payer: 59

## 2020-04-23 ENCOUNTER — Other Ambulatory Visit: Payer: Self-pay

## 2020-04-23 VITALS — BP 164/83 | HR 73 | Temp 98.1°F | Ht 69.0 in | Wt 285.9 lb

## 2020-04-23 DIAGNOSIS — M5410 Radiculopathy, site unspecified: Secondary | ICD-10-CM | POA: Insufficient documentation

## 2020-04-23 DIAGNOSIS — M5136 Other intervertebral disc degeneration, lumbar region: Secondary | ICD-10-CM | POA: Insufficient documentation

## 2020-04-23 DIAGNOSIS — M5137 Other intervertebral disc degeneration, lumbosacral region: Secondary | ICD-10-CM

## 2020-04-23 DIAGNOSIS — M4316 Spondylolisthesis, lumbar region: Secondary | ICD-10-CM | POA: Insufficient documentation

## 2020-04-23 DIAGNOSIS — M47817 Spondylosis without myelopathy or radiculopathy, lumbosacral region: Secondary | ICD-10-CM | POA: Insufficient documentation

## 2020-04-23 NOTE — Progress Notes (Signed)
[facility]      Patient Name:  Henry [name], Henry [name], 65 y.o.male  MRN:  [id number]  DOB:  [birth year]    Date of Service:  04/23/2020      Chief Complaint:    Chief Complaint   Patient presents with   . Low Back Pain   . Leg Pain       HPI:   Ancelmo Hunt is a 65 yo male who presents with low back pain predominantly and left lower extremity pain that is intermittent. He states the back pain is the most limiting pain for him as it limits his ability to stand for any length of time as well as limits his ability to walk. He does have left hip pain with ambulation but the back pain is what limits him he is only able to ambulates about 1-2 blocks. 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 Caryl Comes for and will need a THA, but he wanted to have his spine evaluated. The left radicular symptoms are usually only present first thing in the morning when he initially steps out of bed.  He does have a burning sensation right above the left knee with climbing steps and going down steps. Sitting for prolonged periods hurts his knees and low back.  He has had the low back pain for years and did have [facility] which did help. He has continued to do HEP and he does exercise daily at [facility] but has had recurrence of his back pain. He has been on Diclofenac for years for general arthritis and he is uncertain how much this helps with his low back pain. He he rates his pain on average a 7-9/10. He is trying to lose weight but his weight has always been an issue, he states he has always been a "big boy".       ROS:  Other than ROS in the HPI, all other systems were negative.    Past Medical History:  Past Medical History:   Diagnosis Date   . Cancer (CMS [facility]) 11/2015    prostate cancer   . Chronic pain    . CPAP (continuous positive airway pressure) dependence     settings unknown   . Deep vein thrombosis (DVT) (CMS [facility])     left leg   .  GERD (gastroesophageal reflux disease)     well controlled on omeprazole   . Heartburn    . HTN    . Hyperlipidemia    . Hypertension    . Irritable bowel syndrome    . Neck problem    . Osteoarthritis of back    . Prostate cancer (CMS [facility]) 12/11/2015   . Sleep apnea    . Wears glasses             Past Surgical History:  Past Surgical History:   Procedure Laterality Date   . HX ANKLE FRACTURE TX Right    . HX APPENDECTOMY     . 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 Caryl Comes   . PB COLONOSCOPY,DIAGNOSTIC             Family History:  Family Medical History:     Problem Relation (Age of Onset)    Asthma Father    COPD Father    Cancer Mother, Father    Coronary Artery Disease Mother    Diabetes  Mother    High Cholesterol Mother    Hypertension (High Blood Pressure) Mother    Migraines Mother            Social History:  Social History     Tobacco Use   Smoking Status Former Smoker   . Types: Cigars   . Quit date: 2012   . Years since quitting: 10.1   Smokeless Tobacco Never Used   Tobacco Comment    very rare cigar     Social History     Substance and Sexual Activity   Alcohol Use Yes   . Alcohol/week: 2.0 standard drinks   . Types: 2 Cans of beer per week    Comment: per week     Social History     Occupational History     Employer: [employer]       Medications:   Outpatient Medications Marked as Taking for the 04/23/20 encounter (Office Visit) with Mardee Postin, PA-C   Medication Sig   . Acetaminophen 500 mg Oral Capsule Take 1 Cap (500 mg total) by mouth Every 6 hours as needed   . atorvastatin (LIPITOR) 40 mg Oral Tablet Take 1 Tablet (40 mg total) by mouth Once a day   . diclofenac sodium (VOLTAREN) 75 mg Oral Tablet, Delayed Release (E.C.) Take 1 Tablet (75 mg total) by mouth Twice daily   . dicyclomine (BENTYL) 20 mg Oral Tablet Take 1 Tablet (20 mg total) by mouth Four times a day   . Ferrous Sulfate (SLOW RELEASE IRON) 142 mg (45 mg iron) Oral Tablet Sustained Release Take 1 Tab (142 mg total) by mouth  Three times a day as needed   . GLUCOS CHOND CPLX ADVANCED ORAL Take 2 Tabs by mouth Once a day Glucosamine-Chondroitin 1500 mg   . lisinopriL (PRINIVIL) 10 mg Oral Tablet Take 10 mg by mouth Once a day   . metoprolol tartrate (LOPRESSOR) 50 mg Oral Tablet Take 1 Tablet (50 mg total) by mouth Twice daily   . metroNIDAZOLE 1 % Gel with Pump by Apply externally route Once a day   . multivitamin Oral Tablet Take 1 Tab by mouth Once a day   . Non-Formulary/Special Preparation (MEDICATION HELP) CBD 2 capsules per day    . Omeprazole 20 mg Oral Tablet, Delayed Release (E.C.) Take by mouth Monday Wednesday Friday       Allergies:  Allergies   Allergen Reactions   . Sulfa (Sulfonamides) Rash   . Shellfish Containing Products Itching and Swelling     Shrimp - eye swelling, itching       Physical Exam:  BP (!) 164/83   Pulse 73   Temp 36.7 C (98.1 F)   Ht 1.753 m (5' 9" )   Wt 130 kg (285 lb 15 oz)   BMI 42.23 kg/m          General: no distress  Eyes: Conjunctiva clear.  HENT: ENMT without erythema or injection, mucous membranes moist.  Neck: supple, symmetrical, trachea midline  Genito-urinary: Deferred  Musculoskeletal: ambulates without antalgic gait able to wallk on toes and heels. flexion is normal. positive facet loading.   Skin: Skin warm and dry  Neurologic: DTR are 2/4 at patella and achilles, 5/5 strength in hip flexor, quad, dorsi/plantar flexion, EHL. Negative SLR.   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   L4-5 grade I spondy does reduce a few millimeters on extension.     Assessment/Plan:    ICD-10-CM    1. Spondylolisthesis of lumbar region  M43.16 Lumbar Spine AP & Lateral with Flex-Ext x-ray   2. Radicular pain of lower extremity  M54.10 Refer to [place] This Encounter   . Lumbar Spine AP & Lateral with Flex-Ext x-ray    discussed weight loss to aid in his back pain. His main complaint is the low back pain expecially with prolonged standing and backward extension. Will get back in to therapy as well as refer him to the pain clinic for MBB. If the lower extremity symptoms worsen or persist despite PT will then consider lumbar MRI.       Follow up in 6-8 weeks.           Mardee Postin, PA-C 04/23/2020 08:59

## 2020-04-24 ENCOUNTER — Encounter (HOSPITAL_BASED_OUTPATIENT_CLINIC_OR_DEPARTMENT_OTHER): Payer: Self-pay | Admitting: Family Medicine

## 2020-04-24 NOTE — Progress Notes (Signed)
Henry [name] is a 65 y.o. male.  I called Shanon Brow and reviewed and discussed the results of his lumbar spine films.  He has seen Orthopedics and was referred to the Pain Clinic and has additional follow-up in Orthopedics scheduled.  He cut his lisinopril dose in half from 10 mg a day to 10 mg every other day and dry cough pretty much resolved however his blood pressure seems a little more erratically but difficult to tell because he is having it checked manually with machine and in different places and at different times of the day.  I have asked that he try to get his blood pressure checked couple times a week between now and when he sees me in follow-up continue on lisinopril 10 mg every other day and will address the issues at follow-up later this month.    Rosemarie Beath, MD  04/24/2020, 10:14

## 2020-05-07 ENCOUNTER — Encounter (HOSPITAL_BASED_OUTPATIENT_CLINIC_OR_DEPARTMENT_OTHER): Payer: 59 | Admitting: Family Medicine

## 2020-05-20 ENCOUNTER — Encounter (HOSPITAL_COMMUNITY): Payer: Self-pay

## 2020-05-20 ENCOUNTER — Encounter (INDEPENDENT_AMBULATORY_CARE_PROVIDER_SITE_OTHER): Payer: Self-pay | Admitting: Neurological Surgery

## 2020-05-20 ENCOUNTER — Other Ambulatory Visit: Payer: Self-pay

## 2020-05-20 ENCOUNTER — Ambulatory Visit: Payer: 59 | Attending: Neurological Surgery | Admitting: Neurological Surgery

## 2020-05-20 VITALS — BP 144/86 | HR 71 | Temp 97.8°F | Resp 20 | Ht 69.0 in | Wt 286.4 lb

## 2020-05-20 DIAGNOSIS — M5416 Radiculopathy, lumbar region: Secondary | ICD-10-CM

## 2020-05-20 DIAGNOSIS — M5136 Other intervertebral disc degeneration, lumbar region: Secondary | ICD-10-CM | POA: Insufficient documentation

## 2020-05-20 DIAGNOSIS — M4316 Spondylolisthesis, lumbar region: Secondary | ICD-10-CM | POA: Insufficient documentation

## 2020-05-20 DIAGNOSIS — M545 Low back pain, unspecified: Secondary | ICD-10-CM

## 2020-05-20 DIAGNOSIS — M47817 Spondylosis without myelopathy or radiculopathy, lumbosacral region: Secondary | ICD-10-CM | POA: Insufficient documentation

## 2020-05-20 DIAGNOSIS — M79652 Pain in left thigh: Secondary | ICD-10-CM

## 2020-05-20 MED ORDER — DIAZEPAM 5 MG TABLET
ORAL_TABLET | ORAL | 0 refills | Status: DC
Start: 2020-05-20 — End: 2020-10-14

## 2020-05-20 NOTE — Nursing Note (Signed)
New Patient Visit    [facility]    Chief Complaint   Patient presents with   . Back Pain         [place] Pain Rating Scale     On a scale of 0-10, during the past 24 hours, pain has interfered with you usual activity: 8     On a scale of 0-10, during the past 24 hours, pain has interfered with your sleep: 0    On a scale of 0-10, during the past 24 hours, pain has affected your mood: 4     On a scale of 0-10, during the past 24 hours, pain has contributed to your stress: 4     On a scale of 0-10, what is your overall pain Rating: 6        Vitals:    05/20/20 0852   BP: (!) 144/86   Pulse: 71   Resp: 20   Temp: 36.6 C (97.8 F)   SpO2: 95%   Weight: 130 kg (286 lb 6 oz)   Height: 1.753 m (5' 9" )   PainSc:   6   PainLoc: Back       Body mass index is 42.29 kg/m.    If taking opioid medication, current prescribing provider: na    Recent imaging:  1. No MRI  2. RECENT X-RAYS IN CHART      Prior Pain Management:  PT: yes, +  Heat: yes, +  Ice: yes, +  Chiropractor: no   Bedrest: no   Injection: no   VAS (0-10) Now: 6  VAS (0-10) Best: 4  VAS (0-10) Worst: 9    Appliances Needed:       Other Questions:  Recurring Fevers: no  Numbness/tingling: yes  Trouble sleeping: no  Poor/increased appetite: no  Weight loss/Gain: no  Muscle Spasm: yes  Cold/burning sensation: yes (left knee)  Skin discolors/where: no  Bowel/bladder issues: no  Urinary urgency-frequency: no    Kerin Perna, Ambulatory Care Assistant  05/20/2020, 09:00

## 2020-05-20 NOTE — Progress Notes (Signed)
Pain Management    Chief Complaint:  Chief Complaint   Patient presents with   . Back Pain       History of Present Illness:  Henry [name] is a 65 y.o. male  Location:  Low back (80%) and left thigh (20%)  Radiation:  L3 vs LFC pattern  Character:  burning  Onset:  No event.  Comes on in the morning with waking.  Aggravating:  Walking difficult  Alleviating:  PT helping but not enough.  He's been in for 3 weeks.  Weakness/Sensation Changes/Incontinence:  Tingling in left thigh  Treatments received:  In PT now and its helping some  Functional Impairments:  Limits walking distance  Additional Info:      Past Medical History:  Past Medical History:   Diagnosis Date   . Cancer (CMS [facility]) 11/2015    prostate cancer   . Chronic pain    . CPAP (continuous positive airway pressure) dependence     settings unknown   . Deep vein thrombosis (DVT) (CMS [facility])     left leg   . GERD (gastroesophageal reflux disease)     well controlled on omeprazole   . Heartburn    . HTN    . Hyperlipidemia    . Hypertension    . Irritable bowel syndrome    . Neck problem    . Osteoarthritis of back    . Prostate cancer (CMS [place] [facility]) 12/11/2015   . Sleep apnea    . Wears glasses        Past Surgical History:  Past Surgical History:   Procedure Laterality Date   . HX ANKLE FRACTURE TX Right    . HX APPENDECTOMY     . 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 Caryl Comes   . PB COLONOSCOPY,DIAGNOSTIC             Social History:  Social History     Socioeconomic History   . Marital status: Married     Spouse name: Not on file   . Number of children: Not on file   . Years of education: Not on file   . Highest education level: Not on file   Occupational History     Employer: [employer]   Tobacco Use   . Smoking status: Former Smoker     Types: Cigars     Quit date: 2012     Years since quitting: 10.2   . Smokeless tobacco: Never Used   . Tobacco comment: very rare cigar   Vaping Use   . Vaping Use: Never used   Substance and Sexual Activity   . Alcohol  use: Yes     Alcohol/week: 2.0 standard drinks     Types: 2 Cans of beer per week     Comment: per week   . Drug use: No     Comment: CBD oil, 13-15 drops daily    . Sexual activity: Not on file   Other Topics Concern   . Abuse/Domestic Violence No   . Caffeine Concern No   . Calcium intake adequate Yes   . Computer Use Yes   . Drives Yes   . Exercise Concern No   . Helmet Use Not Asked   . Seat Belt Yes   . Special Diet No   . Sunscreen used Yes   . Uses Cane Not Asked   . Uses walker Not Asked   . Uses wheelchair Not Asked   .  Right hand dominant Yes   . Left hand dominant Not Asked   . Ambidextrous Not Asked   . Shift Work Not Asked   . Unusual Sleep-Wake Schedule Not Asked   . Ability to Walk 1 Flight of Steps without SOB/CP Yes   . Routine Exercise Yes     Comment: [facility], 3-4 times a week, 1 hour,10 min    . Ability to Walk 2 Flight of Steps without SOB/CP Yes   . Unable to Ambulate Not Asked   . Total Care Not Asked   . Ability To Do Own ADL's Yes   . Uses Walker Not Asked   . Other Activity Level Yes     Comment: 14 steps at home. walks a lot. shoveled snow this winter. limited by pain.   Marland Kitchen Uses Cane Not Asked   Social History Narrative   . Not on file     Social Determinants of Health     Financial Resource Strain: Not on file   Food Insecurity: Not on file   Transportation Needs: Not on file   Physical Activity: Not on file   Stress: Not on file   Intimate Partner Violence: Not on file   Housing Stability: Not on file       Family History:  Family Medical History:     Problem Relation (Age of Onset)    Asthma Father    COPD Father    Cancer Mother, Father    Coronary Artery Disease Mother    Diabetes Mother    High Cholesterol Mother    Hypertension (High Blood Pressure) Mother    Migraines Mother            Medications:    Current Outpatient Medications:   .  Acetaminophen 500 mg Oral Capsule, Take 1 Cap (500 mg total) by mouth Every 6 hours as needed, Disp: , Rfl:   .  atorvastatin (LIPITOR) 40 mg  Oral Tablet, Take 1 Tablet (40 mg total) by mouth Once a day, Disp: 90 Tablet, Rfl: 3  .  diclofenac sodium (VOLTAREN) 75 mg Oral Tablet, Delayed Release (E.C.), Take 1 Tablet (75 mg total) by mouth Twice daily, Disp: 180 Tablet, Rfl: 3  .  dicyclomine (BENTYL) 20 mg Oral Tablet, Take 1 Tablet (20 mg total) by mouth Four times a day, Disp: 480 Tablet, Rfl: 3  .  Ferrous Sulfate (SLOW RELEASE IRON) 142 mg (45 mg iron) Oral Tablet Sustained Release, Take 1 Tab (142 mg total) by mouth Three times a day as needed, Disp: 90 Tab, Rfl: 2  .  GLUCOS CHOND CPLX ADVANCED ORAL, Take 2 Tabs by mouth Once a day Glucosamine-Chondroitin 1500 mg, Disp: , Rfl:   .  lisinopriL (PRINIVIL) 10 mg Oral Tablet, Take 10 mg by mouth Once a day, Disp: , Rfl:   .  metoprolol tartrate (LOPRESSOR) 50 mg Oral Tablet, Take 1 Tablet (50 mg total) by mouth Twice daily, Disp: 180 Tablet, Rfl: 3  .  metroNIDAZOLE 1 % Gel with Pump, by Apply externally route Once a day, Disp: , Rfl:   .  multivitamin Oral Tablet, Take 1 Tab by mouth Once a day, Disp: , Rfl:   .  Non-Formulary/Special Preparation (MEDICATION HELP), CBD 2 capsules per day , Disp: , Rfl:   .  Omeprazole 20 mg Oral Tablet, Delayed Release (E.C.), Take by mouth Monday Wednesday Friday, Disp: , Rfl:     Review of Systems:  Denies constipation or sedation  All  other systems negative except for any noted in the HPI    Physical Exam:  Vitals:    05/20/20 0852   BP: (!) 144/86   Pulse: 71   Resp: 20   Temp: 36.6 C (97.8 F)   SpO2: 95%   Weight: 130 kg (286 lb 6 oz)   Height: 1.753 m (5' 9" )   BMI: 42.38         Body mass index is 42.29 kg/m.  Appears stated age  Speech clear  Affect appropriate  Alert  No scleral icterus  Mmm  Adequate peripheral perfusion  Unlabored respirations  No abdominal distention  No edema  No rash  5/5 BLE  Sensation intact BLE  Gait antalgic  A little left PSIS tenderness on the left  +left FF sign    Imaging:  Reviewed.    Outside  Records:  Reviewed.    Assessment:  Low back pain  Left thigh pain  Lumbar radic    Plan:  MRI of L-spine  F/u after MRI  [place] pre-med for MRI      Ashok Cordia, MD

## 2020-05-28 ENCOUNTER — Other Ambulatory Visit (INDEPENDENT_AMBULATORY_CARE_PROVIDER_SITE_OTHER): Payer: Self-pay

## 2020-05-30 ENCOUNTER — Other Ambulatory Visit (HOSPITAL_COMMUNITY): Payer: 59 | Admitting: Radiology

## 2020-06-03 ENCOUNTER — Ambulatory Visit
Admission: RE | Admit: 2020-06-03 | Discharge: 2020-06-03 | Disposition: A | Discharge status: Home / Self Care | Payer: 59 | Source: Ambulatory Visit | Attending: Neurological Surgery | Admitting: Neurological Surgery

## 2020-06-03 ENCOUNTER — Other Ambulatory Visit: Payer: Self-pay

## 2020-06-03 DIAGNOSIS — M5126 Other intervertebral disc displacement, lumbar region: Secondary | ICD-10-CM

## 2020-06-03 DIAGNOSIS — M5416 Radiculopathy, lumbar region: Secondary | ICD-10-CM | POA: Insufficient documentation

## 2020-06-03 DIAGNOSIS — M48061 Spinal stenosis, lumbar region without neurogenic claudication: Secondary | ICD-10-CM

## 2020-06-04 ENCOUNTER — Encounter (HOSPITAL_BASED_OUTPATIENT_CLINIC_OR_DEPARTMENT_OTHER): Payer: Self-pay | Admitting: Orthopaedic Surgery

## 2020-06-06 ENCOUNTER — Encounter (INDEPENDENT_AMBULATORY_CARE_PROVIDER_SITE_OTHER): Payer: Self-pay

## 2020-06-06 ENCOUNTER — Encounter (INDEPENDENT_AMBULATORY_CARE_PROVIDER_SITE_OTHER): Payer: 59 | Admitting: PHYSICIAN ASSISTANT

## 2020-06-10 ENCOUNTER — Other Ambulatory Visit: Payer: Self-pay

## 2020-06-10 ENCOUNTER — Ambulatory Visit: Payer: 59 | Attending: Family Medicine

## 2020-06-10 DIAGNOSIS — R7989 Other specified abnormal findings of blood chemistry: Secondary | ICD-10-CM

## 2020-06-10 DIAGNOSIS — R7309 Other abnormal glucose: Secondary | ICD-10-CM | POA: Insufficient documentation

## 2020-06-10 LAB — HGA1C (HEMOGLOBIN A1C WITH EST AVG GLUCOSE)
ESTIMATED AVERAGE GLUCOSE: 131 mg/dL
HEMOGLOBIN A1C: 6.2 % — ABNORMAL HIGH (ref 4.0–5.6)

## 2020-06-10 LAB — FERRITIN: FERRITIN: 468 ng/mL — ABNORMAL HIGH (ref 20–300)

## 2020-06-11 ENCOUNTER — Ambulatory Visit: Payer: 59 | Attending: Family Medicine | Admitting: Family Medicine

## 2020-06-11 ENCOUNTER — Other Ambulatory Visit: Payer: Self-pay

## 2020-06-11 ENCOUNTER — Encounter (HOSPITAL_BASED_OUTPATIENT_CLINIC_OR_DEPARTMENT_OTHER): Payer: Self-pay | Admitting: Family Medicine

## 2020-06-11 ENCOUNTER — Ambulatory Visit (HOSPITAL_BASED_OUTPATIENT_CLINIC_OR_DEPARTMENT_OTHER): Payer: 59

## 2020-06-11 VITALS — BP 132/88 | HR 70 | Temp 97.7°F | Resp 20 | Ht 69.0 in | Wt 284.8 lb

## 2020-06-11 DIAGNOSIS — I10 Essential (primary) hypertension: Secondary | ICD-10-CM | POA: Insufficient documentation

## 2020-06-11 DIAGNOSIS — Z23 Encounter for immunization: Secondary | ICD-10-CM | POA: Insufficient documentation

## 2020-06-11 DIAGNOSIS — T464X5A Adverse effect of angiotensin-converting-enzyme inhibitors, initial encounter: Secondary | ICD-10-CM | POA: Insufficient documentation

## 2020-06-11 DIAGNOSIS — R058 Other specified cough: Secondary | ICD-10-CM | POA: Insufficient documentation

## 2020-06-11 DIAGNOSIS — R7302 Impaired glucose tolerance (oral): Secondary | ICD-10-CM | POA: Insufficient documentation

## 2020-06-11 MED ORDER — METOPROLOL TARTRATE 75 MG TABLET
75.0000 mg | ORAL_TABLET | Freq: Two times a day (BID) | ORAL | 1 refills | Status: DC
Start: 2020-06-11 — End: 2020-10-14

## 2020-06-11 NOTE — Progress Notes (Signed)
Subjective:     Patient ID:  Henry [name] is an 65 y.o. male   Chief Complaint:    Chief Complaint   Patient presents with   . Diabetes Follow up   . Hypertension   . Anemia       Henry [name] is a 65 y.o. male here in follow-up for hypertension diabetes and anemia.  Once he discontinued lisinopril cough disappeared he went back on lisinopril cough reappeared and he has stayed off the medication lisinopril.    ALLERGIES:   -- Sulfa (Sulfonamides) -- Rash   -- Shellfish Containing Products -- Itching and Swelling    --  Shrimp - eye swelling, itching    Current Outpatient Medications:  Acetaminophen 500 mg Oral Capsule, Take 1 Cap (500 mg total) by mouth Every 6 hours as needed  atorvastatin (LIPITOR) 40 mg Oral Tablet, Take 1 Tablet (40 mg total) by mouth Once a day  diazePAM (VALIUM) 5 mg Oral Tablet, 1 po 60 minutes prior to MRI.  Repeat x1 30 minutes prior if still anxious. (Patient not taking: Reported on 06/11/2020)  diclofenac sodium (VOLTAREN) 75 mg Oral Tablet, Delayed Release (E.C.), Take 1 Tablet (75 mg total) by mouth Twice daily  dicyclomine (BENTYL) 20 mg Oral Tablet, Take 1 Tablet (20 mg total) by mouth Four times a day  Ferrous Sulfate (SLOW RELEASE IRON) 142 mg (45 mg iron) Oral Tablet Sustained Release, Take 1 Tab (142 mg total) by mouth Three times a day as needed  GLUCOS CHOND CPLX ADVANCED ORAL, Take 2 Tabs by mouth Once a day Glucosamine-Chondroitin 1500 mg  metoprolol tartrate (LOPRESSOR) 75 mg Oral Tablet, Take 1 Tablet (75 mg total) by mouth Twice daily  metroNIDAZOLE 1 % Gel with Pump, by Apply externally route Once a day  multivitamin Oral Tablet, Take 1 Tab by mouth Once a day  Non-Formulary/Special Preparation (MEDICATION HELP), CBD 2 capsules per day   Omeprazole 20 mg Oral Tablet, Delayed Release (E.C.), Take by mouth Monday Wednesday Friday                    Review of Systems   Constitutional: Negative.    Respiratory: Negative.    Cardiovascular: Negative.     Musculoskeletal: Positive for arthralgias and myalgias.   Psychiatric/Behavioral: Negative.      Objective:   Physical Exam  Constitutional:       Appearance: Normal appearance.      Comments: BP 132/88 (Site: Left, Patient Position: Sitting, Cuff Size: Adult Large)   Pulse 70   Temp 36.5 C (97.7 F) (Thermal Scan)   Resp 20   Ht 1.753 m (5' 9" )   Wt 129 kg (284 lb 13.4 oz)   SpO2 98%   BMI 42.06 kg/m      Cardiovascular:      Rate and Rhythm: Normal rate.      Heart sounds: Normal heart sounds.   Pulmonary:      Effort: Pulmonary effort is normal.   Neurological:      Mental Status: He is alert and oriented to person, place, and time.   Psychiatric:         Mood and Affect: Mood normal.       Ortho Exam    Assessment & Plan:     (R73.02) Glucose intolerance (impaired glucose tolerance)  (primary encounter diagnosis)  Plan:   Lab Results   Component Value Date    HA1C 6.2 (H) 06/10/2020  Continue diet and activity modifications    (I10) Hypertension, unspecified type  Plan: metoprolol tartrate (LOPRESSOR) 75 mg Oral         Tablet        Increasing Lopressor from 50 mg twice daily to 75 mg twice daily    (R05.8,  T46.4X5A) Cough due to ACE inhibitor  Plan:  Medication was discontinued

## 2020-06-11 NOTE — Patient Instructions (Signed)
VACCINE INFORMATION FACT SHEET FOR RECIPIENTS AND CAREGIVERS ABOUT COMIRNATY (COVID-19 VACCINE, mRNA) AND THE PFIZER-BIONTECH COVID-19 VACCINE TO PREVENT CORONAVIRUS DISEASE 2019 (COVID-19) FOR USE IN INDIVIDUALS 65 YEARS OF AGE AND OLDER   You are being offered either COMIRNATY (COVID-19 Vaccine, mRNA) or the Pfizer-BioNTech COVID-19 Vaccine to prevent Coronavirus Disease 2019 (COVID-19) caused by SARS-CoV-2.   This Vaccine Information Fact Sheet for Recipients and Caregivers comprises the Fact Sheet for the authorized Pfizer-BioNTech COVID-19 Vaccine and also includes information about the FDA-licensed vaccine, COMIRNATY (COVID-19 Vaccine, mRNA) for use in individuals 36 years of age and older.   The FDA-approved COMIRNATY (COVID-19 Vaccine, mRNA) and the Pfizer-BioNTech COVID-19 Vaccine authorized for Emergency Use Authorization (EUA) for individuals 64 years of age and older, when prepared according to their respective instructions for use, can be used interchangeably.1   COMIRNATY (COVID-19 Vaccine, mRNA) is an FDA-approved COVID-19 vaccine made by [employer] for [employer]. It is approved as a 2-dose series for prevention of COVID-19 in individuals 82 years of age and older. It is also authorized under EUA to provide:   . a 2-dose primary series to individuals 12 through 65 years of age; . a third primary series dose to individuals 76 years of age and older who have been determined to have certain kinds of immunocompromise;   . a single booster dose to individuals 60 years of age and older who have completed a primary series with Pfizer-BioNTech COVID-19 Vaccine or COMIRNATY (COVID-19 Vaccine, mRNA); and   . a single booster dose to individuals 72 years of age and older who have completed primary vaccination with another authorized or approved COVID-19 vaccine. The booster schedule is based on the labeling information of the vaccine used for the primary series.   1 When prepared according to their respective  instructions for use, the FDA-approved COMIRNATY (COVID-19 Vaccine, mRNA) and the EUA-authorized Pfizer-BioNTech COVID-19 Vaccine for individuals 37 years of age and older can be used interchangeably without presenting any safety or effectiveness concerns.    The Pfizer-BioNTech COVID-19 Vaccine has received EUA from FDA to   provide: . a 2-dose primary series to individuals 22 years of age and older; . a third primary series dose to individuals 10 years of age and older who   have been determined to have certain kinds of immunocompromise;   . a single booster dose to individuals 57 years of age and older who have completed a primary series with Pfizer-BioNTech COVID-19 Vaccine or COMIRNATY (COVID-19 Vaccine, mRNA); and   . a single booster dose to individuals 2 years of age and older who have completed primary vaccination with another authorized or approved COVID-19 vaccine. The booster schedule is based on the labeling information of the vaccine used for the primary series.   This Vaccine Information Fact Sheet contains information to help you understand the risks and benefits of COMIRNATY (COVID-19 Vaccine, mRNA) and the Pfizer-BioNTech COVID-19 Vaccine, which you may receive because there is currently a pandemic of COVID-19. Talk to your vaccination provider if you have questions.   This Fact Sheet may have been updated. For the most recent Fact Sheet, please see www.TripMetro.hu.   WHAT YOU NEED TO KNOW BEFORE YOU GET THIS VACCINE   WHAT IS COVID-19?   COVID-19 disease is caused by a coronavirus called SARS-CoV-2. You can get COVID-19 through contact with another person who has the virus. It is predominantly a respiratory illness that can affect other organs. People with COVID-19 have had a wide range of  symptoms reported, ranging from mild symptoms to severe illness leading to death. Symptoms may appear 2 to 14 days after exposure to the virus. Symptoms may include: fever or chills; cough; shortness of  breath; fatigue; muscle or body aches; headache; new loss of taste or smell; sore throat; congestion or runny nose; nausea or vomiting; diarrhea.   WHAT IS COMIRNATY (COVID-19 VACCINE, mRNA) AND HOW IS IT RELATED TO THE PFIZER-BIONTECH COVID-19 VACCINE?   COMIRNATY (COVID-19 Vaccine, mRNA) and the Pfizer-BioNTech COVID-19 Vaccine, when prepared according to their respective instructions for use, can be used interchangeably.   For more information on EUA, see the "What is an Emergency Use Authorization (EUA)?" section at the end of this Fact Sheet.   WHAT SHOULD YOU MENTION TO YOUR VACCINATION PROVIDER BEFORE YOU GET THE VACCINE?   Tell the vaccination provider about all of your medical conditions, including if you:   . have any allergies    . have had myocarditis (inflammation of the heart muscle) or pericarditis   (inflammation of the lining outside the heart)    . have a fever    . have a bleeding disorder or are on a blood thinner    . are immunocompromised or are on a medicine that affects your immune  system    . are pregnant or plan to become pregnant    . are breastfeeding    . have received another COVID-19 vaccine    . have ever fainted in association with an injection         HOW IS THE VACCINE GIVEN?   The Pfizer-BioNTech COVID-19 Vaccine or COMIRNATY (COVID-19 Vaccine, mRNA) will be given to you as an injection into the muscle.   Primary Series: The vaccine is administered as a 2-dose series, 3 weeks apart. A third primary series dose may be administered at least 4 weeks after the second dose to individuals who are determined to have certain kinds of immunocompromise.   Booster Dose:   . A single booster dose of the vaccine may be administered at least 5 months after completion of a primary series of the Pfizer-BioNTech COVID-19 Vaccine or COMIRNATY (COVID-19 Vaccine, mRNA) to individuals 4 years of age and older.   . A single booster dose of the vaccine may be administered to individuals 37 years of  age and older who have completed primary vaccination with another authorized or approved COVID-19 vaccine. Please check with your healthcare provider regarding timing of the booster dose.   The vaccine may not protect everyone.   WHO SHOULD NOT GET THE VACCINE?   You should not get the vaccine if you:    . had a severe allergic reaction after a previous dose of this vaccine    . had a severe allergic reaction to any ingredient of this vaccine.   WHAT ARE THE INGREDIENTS IN THE VACCINES?   COMIRNATY (COVID-19 Vaccine, mRNA) and the authorized formulations of the vaccine include the following ingredients:   . mRNA and lipids ((4-hydroxybutyl)azanediyl)bis(hexane-6,1-diyl)bis(2hexyldecanoate), 2 [(polyethylene glycol)-2000]-N,N-ditetradecylacetamide, 1,2Distearoyl-sn-glycero-3-phosphocholine, and cholesterol).     Pfizer-BioNTech COVID-19 vaccines for individuals 31 years of age and older contain 1 of the following sets of additional ingredients; ask the vaccination provider which version is being administered:    . potassium chloride, monobasic potassium phosphate, sodium  chloride, dibasic sodium phosphate dihydrate, and sucrose   OR  OR  . tromethamine, tromethamine hydrochloride, and sucrose   COMIRNATY (COVID-19 Vaccine, mRNA) contains 1 of the following sets of additional ingredients;  ask the vaccination provider which version is being administered:    . potassium chloride, monobasic potassium phosphate, sodium chloride,  dibasic sodium phosphate dihydrate, and sucrose   OR   . tromethamine, tromethamine hydrochloride, and sucrose   HAS THE VACCINE BEEN USED BEFORE?   Yes. In clinical trials, approximately 23,000 individuals 44 years of age and older have received at least 1 dose of the vaccine. Data from these clinical trials supported the Emergency Use Authorization of the Pfizer-BioNTech COVID-19 Vaccines and the approval of COMIRNATY (COVID-19 Vaccine, mRNA). Millions of individuals have received the vaccine  under EUA since January 27, 2019. The vaccine that is authorized for use in individuals 76 years of age and older includes two formulations; one that was studied in clinical trials and used under EUA, and one with the same mRNA and lipids but different inactive ingredients. The use of the different inactive ingredients helps stabilize the vaccine under refrigerated temperatures and the formulation can be administered without dilution.   WHAT ARE THE BENEFITS OF THE VACCINE?   The vaccine has been shown to prevent COVID-19.   The duration of protection against COVID-19 is currently unknown.   WHAT ARE THE RISKS OF THE VACCINE?   There is a remote chance that the vaccine could cause a severe allergic reaction. A severe allergic reaction would usually occur within a few minutes to 1 hour after getting a dose of the vaccine. For this reason, your vaccination provider may ask you to stay at the place where you received your vaccine for monitoring after vaccination. Signs of a severe allergic reaction can include:   Marland Kitchen Difficulty breathing   . Swelling of your face and throat   . A fast heartbeat   . A bad rash all over your body   . Dizziness and weakness     Myocarditis (inflammation of the heart muscle) and pericarditis (inflammation of the lining outside the heart) have occurred in some people who have received the vaccine, more commonly in males under 8 years of age than among females and older males. In most of these people, symptoms began within a few days following receipt of the second dose of vaccine. The chance of having this occur is very low. You should seek medical attention right away if you have any of the following symptoms after receiving the vaccine:   . Chest pain   . Shortness of breath   . Feelings of having a fast-beating, fluttering, or pounding heart   Side effects that have been reported with the vaccine include:   . severe allergic reactions   . non-severe allergic reactions such as rash,  itching, hives, or swelling of the face   . myocarditis (inflammation of the heart muscle)   . pericarditis (inflammation of the lining outside the heart)   . injection site pain   . tiredness   . headache   . muscle pain   . chills   . joint pain   . fever   . injection site swelling   . injection site redness   . nausea   . feeling unwell   . swollen lymph nodes (lymphadenopathy)   . decreased appetite   . diarrhea   . vomiting   . arm pain   . fainting in association with injection of the vaccine   These may not be all the possible side effects of the vaccine. Serious and unexpected side effects may occur. The possible side effects of the vaccine are  still being studied in clinical trials.   WHAT SHOULD I DO ABOUT SIDE EFFECTS?   If you experience a severe allergic reaction, call 9-1-1, or go to the nearest hospital.   Call the vaccination provider or your healthcare provider if you have any side effects that bother you or do not go away.   Report vaccine side effects to FDA/CDC Vaccine Adverse Event Reporting System (VAERS). The VAERS toll-free number is 4077513859 or report online to [email or web address]://www.[email or web address]/. Please include either "COMIRNATY (COVID-19 Vaccine, mRNA)" or "Pfizer-BioNTech COVID-19 Vaccine EUA", as appropriate, in the first line of box #18 of the report form.     In addition, you can report side effects to [employer]. at the contact information provided below.   Website  Fax number  Telephone number    www.pfizersafetyreporting.com  [phone]  [phone]      You may also be given an option to enroll in v-safe. V-safe is a new voluntary smartphone-based tool that uses text messaging and web surveys to check in with people who have been vaccinated to identify potential side effects after COVID-19 vaccination. V-safe asks questions that help CDC monitor the safety of COVID-19 vaccines. V-safe also provides second-dose reminders if needed and live telephone follow-up by CDC  if participants report a significant health impact following COVID-19 vaccination. For more information on how to sign up, visit: [email or web address].   WHAT IF I DECIDE NOT TO GET COMIRNATY (COVID-19 VACCINE, mRNA) OR THE PFIZER-BIONTECH COVID-19 VACCINE?   Under the EUA, it is your choice to receive or not receive the vaccine. Should you decide not to receive it, it will not change your standard medical care.   ARE OTHER CHOICES AVAILABLE FOR PREVENTING COVID-19 BESIDES COMIRNATY (COVID-19 VACCINE, mRNA) OR THE PFIZER-BIONTECH COVID-19 VACCINE?   Another choice for preventing COVID-19 is SPIKEVAX, an FDA-approved COVID-19 vaccine. Other vaccines to prevent COVID-19 may be available under Emergency Use Authorization.   CAN I RECEIVE THE COMIRNATY (COVID-19 VACCINE, mRNA) OR PFIZER-BIONTECH COVID-19 VACCINE AT THE SAME TIME AS OTHER VACCINES?   Data have not yet been submitted to FDA on administration of COMIRNATY (COVID-19 Vaccine, mRNA) or the Pfizer-BioNTech COVID-19 Vaccine at the same time with other vaccines. If you are considering receiving COMIRNATY (COVID-19 Vaccine, mRNA) or the Pfizer-BioNTech COVID-19 Vaccine with other vaccines, discuss your options with your healthcare provider.   WHAT IF I AM IMMUNOCOMPROMISED?   If you are immunocompromised, you may receive a third dose of the vaccine. The third dose may still not provide full immunity to COVID-19 in people who are immunocompromised, and you should continue to maintain physical precautions to help prevent COVID-19. In addition, your close contacts should be vaccinated as appropriate.   WHAT IF I AM PREGNANT OR BREASTFEEDING?   If you are pregnant or breastfeeding, discuss your options with your healthcare provider.   WILL THE VACCINE GIVE ME COVID-19?   No. The vaccine does not contain SARS-CoV-2 and cannot give you COVID-19.   KEEP YOUR VACCINATION CARD   When you get your first dose, you will get a vaccination card to show you when to return for your  next dose(s) of the vaccine. Remember to bring your card when you return.   ADDITIONAL INFORMATION   If you have questions, visit the website or call the telephone number provided below.   To access the most recent Fact Sheets, please scan the QR code provided below.       HOW CAN I LEARN MORE?   Marland Kitchen  Ask the vaccination provider.   . Visit CDC at BeginnerSteps.be.   . Visit FDA at DirectoryExclusive.com.cy  legal-regulatory-and-policy-framework/emergency-use-authorization.   Minette Brine your local or state public health department.   WHERE WILL MY VACCINATION INFORMATION BE RECORDED?   The vaccination provider may include your vaccination information in your state/local jurisdiction's Immunization Information System (IIS) or other designated system. This will ensure that you receive the same vaccine when you return for the second dose. For more information about IISs visit: [email or web address].   CAN I BE CHARGED AN ADMINISTRATION FEE FOR RECEIPT OF THE COVID-19 VACCINE?   No. At this time, the provider cannot charge you for a vaccine dose and you cannot be charged an out-of-pocket vaccine administration fee or any other fee if only receiving a COVID-19 vaccination. However, vaccination providers may seek appropriate reimbursement from a program or plan that covers COVID-19 vaccine administration fees for the vaccine recipient (private insurance, Medicare, Medicaid, [place] [HRSA] COVID-19 Uninsured Program for non-insured recipients).   WHERE CAN I REPORT CASES OF SUSPECTED FRAUD?   Individuals becoming aware of any potential violations of the CDC COVID-19 Vaccination Program requirements are encouraged to report them to the Office of the [employer], U.S. Department of Health and [employer], at 1-800-HHS-TIPS or https://TIPS.HHS.GOV.   WHAT IS THE COUNTERMEASURES INJURY  COMPENSATION PROGRAM?   The Countermeasures Injury Compensation Program (CICP) is a federal program that may help pay for costs of medical care and other specific expenses of certain people who have been seriously injured by certain medicines or vaccines, including this vaccine. Generally, a claim must be submitted to the CICP within one (1) year from the date of receiving the vaccine. To learn more about this program, visit https://www.bennett.info/ or call (905)617-7591.   WHAT IS AN EMERGENCY USE AUTHORIZATION (EUA)?   An Emergency Use Authorization (EUA) is a mechanism to facilitate the availability and use of medical products, including vaccines, during public health emergencies, such as the current COVID-19 pandemic. An EUA is supported by a Risk manager (HHS) declaration that circumstances exist to justify the emergency use of drugs and biological products during the COVID-19 pandemic.   The FDA may issue an EUA when certain criteria are met, which includes that there are no adequate, approved, available alternatives. In addition, the FDA decision is based on the totality of scientific evidence available showing that the product may be effective to prevent COVID-19 during the COVID-19 pandemic and that the known and potential benefits of the product outweigh the known and potential risks of the product. All of these criteria must be met to allow for the product to be used in the treatment of patients during the COVID-19 pandemic    This EUA for the Pfizer-BioNTech COVID-19 Vaccine and COMIRNATY (COVID-19 Vaccine, mRNA) will end when the Secretary of HHS determines that the circumstances justifying the EUA no longer exist or when there is a change in the approval status of the product such that an EUA is no longer needed.    Manufactured by  [employer] [place] [address]    Manufactured for  [address]  [place], [place]   [id number]  Revised  March 18, 2020.

## 2020-06-27 ENCOUNTER — Other Ambulatory Visit: Payer: Self-pay

## 2020-06-27 ENCOUNTER — Ambulatory Visit: Payer: 59 | Attending: Family Medicine

## 2020-06-27 DIAGNOSIS — Z23 Encounter for immunization: Secondary | ICD-10-CM

## 2020-06-27 NOTE — Patient Instructions (Signed)
VACCINE INFORMATION FACT SHEET FOR RECIPIENTS AND CAREGIVERS ABOUT COMIRNATY (COVID-19 VACCINE, mRNA) AND THE PFIZER-BIONTECH COVID-19 VACCINE TO PREVENT CORONAVIRUS DISEASE 2019 (COVID-19) FOR USE IN INDIVIDUALS 65 YEARS OF AGE AND OLDER   You are being offered either COMIRNATY (COVID-19 Vaccine, mRNA) or the Pfizer-BioNTech COVID-19 Vaccine to prevent Coronavirus Disease 2019 (COVID-19) caused by SARS-CoV-2.   This Vaccine Information Fact Sheet for Recipients and Caregivers comprises the Fact Sheet for the authorized Pfizer-BioNTech COVID-19 Vaccine and also includes information about the FDA-licensed vaccine, COMIRNATY (COVID-19 Vaccine, mRNA) for use in individuals 36 years of age and older.   The FDA-approved COMIRNATY (COVID-19 Vaccine, mRNA) and the Pfizer-BioNTech COVID-19 Vaccine authorized for Emergency Use Authorization (EUA) for individuals 64 years of age and older, when prepared according to their respective instructions for use, can be used interchangeably.1   COMIRNATY (COVID-19 Vaccine, mRNA) is an FDA-approved COVID-19 vaccine made by [employer] for [employer]. It is approved as a 2-dose series for prevention of COVID-19 in individuals 82 years of age and older. It is also authorized under EUA to provide:   . a 2-dose primary series to individuals 12 through 65 years of age; . a third primary series dose to individuals 76 years of age and older who have been determined to have certain kinds of immunocompromise;   . a single booster dose to individuals 60 years of age and older who have completed a primary series with Pfizer-BioNTech COVID-19 Vaccine or COMIRNATY (COVID-19 Vaccine, mRNA); and   . a single booster dose to individuals 72 years of age and older who have completed primary vaccination with another authorized or approved COVID-19 vaccine. The booster schedule is based on the labeling information of the vaccine used for the primary series.   1 When prepared according to their respective  instructions for use, the FDA-approved COMIRNATY (COVID-19 Vaccine, mRNA) and the EUA-authorized Pfizer-BioNTech COVID-19 Vaccine for individuals 37 years of age and older can be used interchangeably without presenting any safety or effectiveness concerns.    The Pfizer-BioNTech COVID-19 Vaccine has received EUA from FDA to   provide: . a 2-dose primary series to individuals 22 years of age and older; . a third primary series dose to individuals 10 years of age and older who   have been determined to have certain kinds of immunocompromise;   . a single booster dose to individuals 57 years of age and older who have completed a primary series with Pfizer-BioNTech COVID-19 Vaccine or COMIRNATY (COVID-19 Vaccine, mRNA); and   . a single booster dose to individuals 2 years of age and older who have completed primary vaccination with another authorized or approved COVID-19 vaccine. The booster schedule is based on the labeling information of the vaccine used for the primary series.   This Vaccine Information Fact Sheet contains information to help you understand the risks and benefits of COMIRNATY (COVID-19 Vaccine, mRNA) and the Pfizer-BioNTech COVID-19 Vaccine, which you may receive because there is currently a pandemic of COVID-19. Talk to your vaccination provider if you have questions.   This Fact Sheet may have been updated. For the most recent Fact Sheet, please see www.TripMetro.hu.   WHAT YOU NEED TO KNOW BEFORE YOU GET THIS VACCINE   WHAT IS COVID-19?   COVID-19 disease is caused by a coronavirus called SARS-CoV-2. You can get COVID-19 through contact with another person who has the virus. It is predominantly a respiratory illness that can affect other organs. People with COVID-19 have had a wide range of  symptoms reported, ranging from mild symptoms to severe illness leading to death. Symptoms may appear 2 to 14 days after exposure to the virus. Symptoms may include: fever or chills; cough; shortness of  breath; fatigue; muscle or body aches; headache; new loss of taste or smell; sore throat; congestion or runny nose; nausea or vomiting; diarrhea.   WHAT IS COMIRNATY (COVID-19 VACCINE, mRNA) AND HOW IS IT RELATED TO THE PFIZER-BIONTECH COVID-19 VACCINE?   COMIRNATY (COVID-19 Vaccine, mRNA) and the Pfizer-BioNTech COVID-19 Vaccine, when prepared according to their respective instructions for use, can be used interchangeably.   For more information on EUA, see the "What is an Emergency Use Authorization (EUA)?" section at the end of this Fact Sheet.   WHAT SHOULD YOU MENTION TO YOUR VACCINATION PROVIDER BEFORE YOU GET THE VACCINE?   Tell the vaccination provider about all of your medical conditions, including if you:   . have any allergies    . have had myocarditis (inflammation of the heart muscle) or pericarditis   (inflammation of the lining outside the heart)    . have a fever    . have a bleeding disorder or are on a blood thinner    . are immunocompromised or are on a medicine that affects your immune  system    . are pregnant or plan to become pregnant    . are breastfeeding    . have received another COVID-19 vaccine    . have ever fainted in association with an injection         HOW IS THE VACCINE GIVEN?   The Pfizer-BioNTech COVID-19 Vaccine or COMIRNATY (COVID-19 Vaccine, mRNA) will be given to you as an injection into the muscle.   Primary Series: The vaccine is administered as a 2-dose series, 3 weeks apart. A third primary series dose may be administered at least 4 weeks after the second dose to individuals who are determined to have certain kinds of immunocompromise.   Booster Dose:   . A single booster dose of the vaccine may be administered at least 5 months after completion of a primary series of the Pfizer-BioNTech COVID-19 Vaccine or COMIRNATY (COVID-19 Vaccine, mRNA) to individuals 4 years of age and older.   . A single booster dose of the vaccine may be administered to individuals 37 years of  age and older who have completed primary vaccination with another authorized or approved COVID-19 vaccine. Please check with your healthcare provider regarding timing of the booster dose.   The vaccine may not protect everyone.   WHO SHOULD NOT GET THE VACCINE?   You should not get the vaccine if you:    . had a severe allergic reaction after a previous dose of this vaccine    . had a severe allergic reaction to any ingredient of this vaccine.   WHAT ARE THE INGREDIENTS IN THE VACCINES?   COMIRNATY (COVID-19 Vaccine, mRNA) and the authorized formulations of the vaccine include the following ingredients:   . mRNA and lipids ((4-hydroxybutyl)azanediyl)bis(hexane-6,1-diyl)bis(2hexyldecanoate), 2 [(polyethylene glycol)-2000]-N,N-ditetradecylacetamide, 1,2Distearoyl-sn-glycero-3-phosphocholine, and cholesterol).     Pfizer-BioNTech COVID-19 vaccines for individuals 31 years of age and older contain 1 of the following sets of additional ingredients; ask the vaccination provider which version is being administered:    . potassium chloride, monobasic potassium phosphate, sodium  chloride, dibasic sodium phosphate dihydrate, and sucrose   OR  OR  . tromethamine, tromethamine hydrochloride, and sucrose   COMIRNATY (COVID-19 Vaccine, mRNA) contains 1 of the following sets of additional ingredients;  ask the vaccination provider which version is being administered:    . potassium chloride, monobasic potassium phosphate, sodium chloride,  dibasic sodium phosphate dihydrate, and sucrose   OR   . tromethamine, tromethamine hydrochloride, and sucrose   HAS THE VACCINE BEEN USED BEFORE?   Yes. In clinical trials, approximately 23,000 individuals 44 years of age and older have received at least 1 dose of the vaccine. Data from these clinical trials supported the Emergency Use Authorization of the Pfizer-BioNTech COVID-19 Vaccines and the approval of COMIRNATY (COVID-19 Vaccine, mRNA). Millions of individuals have received the vaccine  under EUA since January 27, 2019. The vaccine that is authorized for use in individuals 76 years of age and older includes two formulations; one that was studied in clinical trials and used under EUA, and one with the same mRNA and lipids but different inactive ingredients. The use of the different inactive ingredients helps stabilize the vaccine under refrigerated temperatures and the formulation can be administered without dilution.   WHAT ARE THE BENEFITS OF THE VACCINE?   The vaccine has been shown to prevent COVID-19.   The duration of protection against COVID-19 is currently unknown.   WHAT ARE THE RISKS OF THE VACCINE?   There is a remote chance that the vaccine could cause a severe allergic reaction. A severe allergic reaction would usually occur within a few minutes to 1 hour after getting a dose of the vaccine. For this reason, your vaccination provider may ask you to stay at the place where you received your vaccine for monitoring after vaccination. Signs of a severe allergic reaction can include:   Marland Kitchen Difficulty breathing   . Swelling of your face and throat   . A fast heartbeat   . A bad rash all over your body   . Dizziness and weakness     Myocarditis (inflammation of the heart muscle) and pericarditis (inflammation of the lining outside the heart) have occurred in some people who have received the vaccine, more commonly in males under 8 years of age than among females and older males. In most of these people, symptoms began within a few days following receipt of the second dose of vaccine. The chance of having this occur is very low. You should seek medical attention right away if you have any of the following symptoms after receiving the vaccine:   . Chest pain   . Shortness of breath   . Feelings of having a fast-beating, fluttering, or pounding heart   Side effects that have been reported with the vaccine include:   . severe allergic reactions   . non-severe allergic reactions such as rash,  itching, hives, or swelling of the face   . myocarditis (inflammation of the heart muscle)   . pericarditis (inflammation of the lining outside the heart)   . injection site pain   . tiredness   . headache   . muscle pain   . chills   . joint pain   . fever   . injection site swelling   . injection site redness   . nausea   . feeling unwell   . swollen lymph nodes (lymphadenopathy)   . decreased appetite   . diarrhea   . vomiting   . arm pain   . fainting in association with injection of the vaccine   These may not be all the possible side effects of the vaccine. Serious and unexpected side effects may occur. The possible side effects of the vaccine are  still being studied in clinical trials.   WHAT SHOULD I DO ABOUT SIDE EFFECTS?   If you experience a severe allergic reaction, call 9-1-1, or go to the nearest hospital.   Call the vaccination provider or your healthcare provider if you have any side effects that bother you or do not go away.   Report vaccine side effects to FDA/CDC Vaccine Adverse Event Reporting System (VAERS). The VAERS toll-free number is 4077513859 or report online to [email or web address]://www.[email or web address]/. Please include either "COMIRNATY (COVID-19 Vaccine, mRNA)" or "Pfizer-BioNTech COVID-19 Vaccine EUA", as appropriate, in the first line of box #18 of the report form.     In addition, you can report side effects to [employer]. at the contact information provided below.   Website  Fax number  Telephone number    www.pfizersafetyreporting.com  [phone]  [phone]      You may also be given an option to enroll in v-safe. V-safe is a new voluntary smartphone-based tool that uses text messaging and web surveys to check in with people who have been vaccinated to identify potential side effects after COVID-19 vaccination. V-safe asks questions that help CDC monitor the safety of COVID-19 vaccines. V-safe also provides second-dose reminders if needed and live telephone follow-up by CDC  if participants report a significant health impact following COVID-19 vaccination. For more information on how to sign up, visit: [email or web address].   WHAT IF I DECIDE NOT TO GET COMIRNATY (COVID-19 VACCINE, mRNA) OR THE PFIZER-BIONTECH COVID-19 VACCINE?   Under the EUA, it is your choice to receive or not receive the vaccine. Should you decide not to receive it, it will not change your standard medical care.   ARE OTHER CHOICES AVAILABLE FOR PREVENTING COVID-19 BESIDES COMIRNATY (COVID-19 VACCINE, mRNA) OR THE PFIZER-BIONTECH COVID-19 VACCINE?   Another choice for preventing COVID-19 is SPIKEVAX, an FDA-approved COVID-19 vaccine. Other vaccines to prevent COVID-19 may be available under Emergency Use Authorization.   CAN I RECEIVE THE COMIRNATY (COVID-19 VACCINE, mRNA) OR PFIZER-BIONTECH COVID-19 VACCINE AT THE SAME TIME AS OTHER VACCINES?   Data have not yet been submitted to FDA on administration of COMIRNATY (COVID-19 Vaccine, mRNA) or the Pfizer-BioNTech COVID-19 Vaccine at the same time with other vaccines. If you are considering receiving COMIRNATY (COVID-19 Vaccine, mRNA) or the Pfizer-BioNTech COVID-19 Vaccine with other vaccines, discuss your options with your healthcare provider.   WHAT IF I AM IMMUNOCOMPROMISED?   If you are immunocompromised, you may receive a third dose of the vaccine. The third dose may still not provide full immunity to COVID-19 in people who are immunocompromised, and you should continue to maintain physical precautions to help prevent COVID-19. In addition, your close contacts should be vaccinated as appropriate.   WHAT IF I AM PREGNANT OR BREASTFEEDING?   If you are pregnant or breastfeeding, discuss your options with your healthcare provider.   WILL THE VACCINE GIVE ME COVID-19?   No. The vaccine does not contain SARS-CoV-2 and cannot give you COVID-19.   KEEP YOUR VACCINATION CARD   When you get your first dose, you will get a vaccination card to show you when to return for your  next dose(s) of the vaccine. Remember to bring your card when you return.   ADDITIONAL INFORMATION   If you have questions, visit the website or call the telephone number provided below.   To access the most recent Fact Sheets, please scan the QR code provided below.       HOW CAN I LEARN MORE?   Marland Kitchen  Ask the vaccination provider.   . Visit CDC at BeginnerSteps.be.   . Visit FDA at DirectoryExclusive.com.cy  legal-regulatory-and-policy-framework/emergency-use-authorization.   Minette Brine your local or state public health department.   WHERE WILL MY VACCINATION INFORMATION BE RECORDED?   The vaccination provider may include your vaccination information in your state/local jurisdiction's Immunization Information System (IIS) or other designated system. This will ensure that you receive the same vaccine when you return for the second dose. For more information about IISs visit: [email or web address].   CAN I BE CHARGED AN ADMINISTRATION FEE FOR RECEIPT OF THE COVID-19 VACCINE?   No. At this time, the provider cannot charge you for a vaccine dose and you cannot be charged an out-of-pocket vaccine administration fee or any other fee if only receiving a COVID-19 vaccination. However, vaccination providers may seek appropriate reimbursement from a program or plan that covers COVID-19 vaccine administration fees for the vaccine recipient (private insurance, Medicare, Medicaid, [place] [HRSA] COVID-19 Uninsured Program for non-insured recipients).   WHERE CAN I REPORT CASES OF SUSPECTED FRAUD?   Individuals becoming aware of any potential violations of the CDC COVID-19 Vaccination Program requirements are encouraged to report them to the Office of the [employer], U.S. Department of Health and [employer], at 1-800-HHS-TIPS or https://TIPS.HHS.GOV.   WHAT IS THE COUNTERMEASURES INJURY  COMPENSATION PROGRAM?   The Countermeasures Injury Compensation Program (CICP) is a federal program that may help pay for costs of medical care and other specific expenses of certain people who have been seriously injured by certain medicines or vaccines, including this vaccine. Generally, a claim must be submitted to the CICP within one (1) year from the date of receiving the vaccine. To learn more about this program, visit https://www.bennett.info/ or call (905)617-7591.   WHAT IS AN EMERGENCY USE AUTHORIZATION (EUA)?   An Emergency Use Authorization (EUA) is a mechanism to facilitate the availability and use of medical products, including vaccines, during public health emergencies, such as the current COVID-19 pandemic. An EUA is supported by a Risk manager (HHS) declaration that circumstances exist to justify the emergency use of drugs and biological products during the COVID-19 pandemic.   The FDA may issue an EUA when certain criteria are met, which includes that there are no adequate, approved, available alternatives. In addition, the FDA decision is based on the totality of scientific evidence available showing that the product may be effective to prevent COVID-19 during the COVID-19 pandemic and that the known and potential benefits of the product outweigh the known and potential risks of the product. All of these criteria must be met to allow for the product to be used in the treatment of patients during the COVID-19 pandemic    This EUA for the Pfizer-BioNTech COVID-19 Vaccine and COMIRNATY (COVID-19 Vaccine, mRNA) will end when the Secretary of HHS determines that the circumstances justifying the EUA no longer exist or when there is a change in the approval status of the product such that an EUA is no longer needed.    Manufactured by  [employer] [place] [address]    Manufactured for  [address]  [place], [place]   [id number]  Revised  March 18, 2020.

## 2020-07-02 ENCOUNTER — Encounter (HOSPITAL_BASED_OUTPATIENT_CLINIC_OR_DEPARTMENT_OTHER): Payer: Self-pay | Admitting: Orthopaedic Surgery

## 2020-07-11 ENCOUNTER — Ambulatory Visit: Payer: 59 | Attending: Neurological Surgery | Admitting: Neurological Surgery

## 2020-07-11 ENCOUNTER — Encounter (INDEPENDENT_AMBULATORY_CARE_PROVIDER_SITE_OTHER): Payer: Self-pay | Admitting: Neurological Surgery

## 2020-07-11 ENCOUNTER — Other Ambulatory Visit: Payer: Self-pay

## 2020-07-11 VITALS — BP 142/89 | HR 67 | Temp 97.2°F | Resp 20 | Ht 69.0 in | Wt 280.6 lb

## 2020-07-11 DIAGNOSIS — M545 Low back pain, unspecified: Secondary | ICD-10-CM

## 2020-07-11 DIAGNOSIS — M5416 Radiculopathy, lumbar region: Secondary | ICD-10-CM

## 2020-07-11 DIAGNOSIS — M48061 Spinal stenosis, lumbar region without neurogenic claudication: Secondary | ICD-10-CM | POA: Insufficient documentation

## 2020-07-11 MED ORDER — DIPHENHYDRAMINE 50 MG CAPSULE
ORAL_CAPSULE | ORAL | 0 refills | Status: DC
Start: 2020-07-11 — End: 2021-03-31

## 2020-07-11 MED ORDER — PREDNISONE 50 MG TABLET
ORAL_TABLET | ORAL | 0 refills | Status: DC
Start: 2020-07-11 — End: 2020-10-14

## 2020-07-11 NOTE — Nursing Note (Signed)
MRI 4.18.22.  Jacquenette Shone, Ambulatory Care Assistant  07/11/2020, 08:35

## 2020-07-11 NOTE — Progress Notes (Signed)
Pain Management    Chief Complaint:  Chief Complaint   Patient presents with   . Back Pain       History of Present Illness:  Henry [name] is a 65 y.o. male  Reports PT has helped but the pain is limiting.  Comes on with walking.  Has to sit down after walking a short distance.    Past Medical History:  Past Medical History:   Diagnosis Date   . Cancer (CMS [facility]) 11/2015    prostate cancer   . Chronic pain    . CPAP (continuous positive airway pressure) dependence     settings unknown   . Deep vein thrombosis (DVT) (CMS [facility])     left leg   . GERD (gastroesophageal reflux disease)     well controlled on omeprazole   . Heartburn    . HTN    . Hyperlipidemia    . Hypertension    . Irritable bowel syndrome    . Neck problem    . Osteoarthritis of back    . Prostate cancer (CMS [facility]) 12/11/2015   . Sleep apnea    . Wears glasses        Past Surgical History:  Past Surgical History:   Procedure Laterality Date   . HX ANKLE FRACTURE TX Right    . HX APPENDECTOMY     . 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 Caryl Comes   . PB COLONOSCOPY,DIAGNOSTIC             Social History:  Social History     Socioeconomic History   . Marital status: Married     Spouse name: Not on file   . Number of children: Not on file   . Years of education: Not on file   . Highest education level: Not on file   Occupational History     Employer: [employer]   Tobacco Use   . Smoking status: Former Smoker     Types: Cigars     Quit date: 2012     Years since quitting: 10.4   . Smokeless tobacco: Never Used   . Tobacco comment: very rare cigar   Vaping Use   . Vaping Use: Never used   Substance and Sexual Activity   . Alcohol use: Yes     Alcohol/week: 2.0 standard drinks     Types: 2 Cans of beer per week     Comment: per week   . Drug use: No     Comment: CBD oil, 13-15 drops daily    . Sexual activity: Not on file   Other Topics Concern   . Abuse/Domestic Violence No   . Caffeine Concern No   . Calcium intake adequate Yes   . Computer Use Yes    . Drives Yes   . Exercise Concern No   . Helmet Use Not Asked   . Seat Belt Yes   . Special Diet No   . Sunscreen used Yes   . Uses Cane Not Asked   . Uses walker Not Asked   . Uses wheelchair Not Asked   . Right hand dominant Yes   . Left hand dominant Not Asked   . Ambidextrous Not Asked   . Shift Work Not Asked   . Unusual Sleep-Wake Schedule Not Asked   . Ability to Walk 1 Flight of Steps without SOB/CP Yes   . Routine Exercise Yes     Comment: [employer],  3-4 times a week, 1 hour,10 min    . Ability to Walk 2 Flight of Steps without SOB/CP Yes   . Unable to Ambulate Not Asked   . Total Care Not Asked   . Ability To Do Own ADL's Yes   . Uses Walker Not Asked   . Other Activity Level Yes     Comment: 14 steps at home. walks a lot. shoveled snow this winter. limited by pain.   Marland Kitchen Uses Cane Not Asked   Social History Narrative   . Not on file     Social Determinants of Health     Financial Resource Strain: Not on file   Food Insecurity: Not on file   Transportation Needs: Not on file   Physical Activity: Not on file   Stress: Not on file   Intimate Partner Violence: Not on file   Housing Stability: Not on file       Family History:  Family Medical History:     Problem Relation (Age of Onset)    Asthma Father    COPD Father    Cancer Mother, Father    Coronary Artery Disease Mother    Diabetes Mother    High Cholesterol Mother    Hypertension (High Blood Pressure) Mother    Migraines Mother            Medications:    Current Outpatient Medications:   .  Acetaminophen 500 mg Oral Capsule, Take 1 Cap (500 mg total) by mouth Every 6 hours as needed, Disp: , Rfl:   .  atorvastatin (LIPITOR) 40 mg Oral Tablet, Take 1 Tablet (40 mg total) by mouth Once a day, Disp: 90 Tablet, Rfl: 3  .  diazePAM (VALIUM) 5 mg Oral Tablet, 1 po 60 minutes prior to MRI.  Repeat x1 30 minutes prior if still anxious. (Patient not taking: No sig reported), Disp: 2 Tablet, Rfl: 0  .  diclofenac sodium (VOLTAREN) 75 mg Oral Tablet, Delayed  Release (E.C.), Take 1 Tablet (75 mg total) by mouth Twice daily, Disp: 180 Tablet, Rfl: 3  .  dicyclomine (BENTYL) 20 mg Oral Tablet, Take 1 Tablet (20 mg total) by mouth Four times a day, Disp: 480 Tablet, Rfl: 3  .  Ferrous Sulfate (SLOW RELEASE IRON) 142 mg (45 mg iron) Oral Tablet Sustained Release, Take 1 Tab (142 mg total) by mouth Three times a day as needed, Disp: 90 Tab, Rfl: 2  .  GLUCOS CHOND CPLX ADVANCED ORAL, Take 2 Tabs by mouth Once a day Glucosamine-Chondroitin 1500 mg, Disp: , Rfl:   .  metoprolol tartrate (LOPRESSOR) 75 mg Oral Tablet, Take 1 Tablet (75 mg total) by mouth Twice daily, Disp: 180 Tablet, Rfl: 1  .  metroNIDAZOLE 1 % Gel with Pump, by Apply externally route Once a day, Disp: , Rfl:   .  multivitamin Oral Tablet, Take 1 Tab by mouth Once a day, Disp: , Rfl:   .  Non-Formulary/Special Preparation (MEDICATION HELP), CBD 2 capsules per day , Disp: , Rfl:   .  Omeprazole 20 mg Oral Tablet, Delayed Release (E.C.), Take by mouth Monday Wednesday Friday, Disp: , Rfl:     Review of Systems:  Denies constipation or sedation  All other systems negative except for any noted in the HPI    Physical Exam:  Vitals:    07/11/20 1550   BP: (!) 142/89   Pulse: 67   Resp: 20   Temp: 36.2 C (97.2 F)  SpO2: 97%   Weight: 127 kg (280 lb 10.3 oz)   Height: 1.753 m (5' 9" )   BMI: 41.53         Body mass index is 41.44 kg/m.  Appears stated age  Speech clear  Affect appropriate  Alert  No scleral icterus  Mmm  Adequate peripheral perfusion  Unlabored respirations  No abdominal distention  No edema  No rash  Gait WNL    Imaging:  Reviewed.    Outside Records:  Reviewed.    Assessment:  Lumbar stenosis  Low back pain  Lumbar radic    Plan:  L4/5 LESI  Continue with PT    Ashok Cordia, MD

## 2020-07-11 NOTE — Nursing Note (Signed)
Return Patient Visit    Henry [name] [facility]    Chief Complaint   Patient presents with   . Back Pain         [place] Pain Rating Scale     On a scale of 0-10, during the past 24 hours, pain has interfered with you usual activity:       On a scale of 0-10, during the past 24 hours, pain has interfered with your sleep:      On a scale of 0-10, during the past 24 hours, pain has affected your mood:       On a scale of 0-10, during the past 24 hours, pain has contributed to your stress:       On a scale of 0-10, what is your overall pain Rating:          Vitals:    07/11/20 1550   BP: (!) 142/89   Pulse: 67   Resp: 20   Temp: 36.2 C (97.2 F)   SpO2: 97%   Weight: 127 kg (280 lb 10.3 oz)   Height: 1.753 m (5' 9" )   PainSc:   6   PainLoc: Back       Body mass index is 41.44 kg/m.    New imaging since last visit:  1. Mri 4/18 22  Reason for today's visit: f/u [facility] [place]  07/11/2020, 15:51

## 2020-07-16 ENCOUNTER — Other Ambulatory Visit: Payer: Self-pay

## 2020-07-16 ENCOUNTER — Ambulatory Visit: Payer: 59 | Attending: Neurological Surgery | Admitting: Neurological Surgery

## 2020-07-16 DIAGNOSIS — M48061 Spinal stenosis, lumbar region without neurogenic claudication: Secondary | ICD-10-CM | POA: Insufficient documentation

## 2020-07-16 NOTE — Progress Notes (Signed)
[place] Pain Rating Scale     On a scale of 0-10, during the past 24 hours, pain has interfered with you usual activity: 6     On a scale of 0-10, during the past 24 hours, pain has interfered with your sleep: 0    On a scale of 0-10, during the past 24 hours, pain has affected your mood: 3     On a scale of 0-10, during the past 24 hours, pain has contributed to your stress: 3     On a scale of 0-10, what is your overall pain Rating: 4

## 2020-07-16 NOTE — Patient Instructions (Signed)
[facility]IN MANAGEMENT, [facility]  [address]  [place] [address]  Dept: [phone]  Dept Fax: [phone]  [phone]                                                 SPECIAL PROCEDURES                                     DISCHARGE FORM                                          [phone]      Please follow the instructions listed below for your procedures.  If you have questions concerning your procedure, you may call and leave a message.  Messages will be returned by the end of the next business day.  If you have an emergency, proceed to your local Emergency Department.      PROCEDURE: LESI L4-L5     Do not drive a car or operate machinery until tomorrow.   Rest today and return to normal activities tomorrow.   If you are on restricted activities by your physician, please continue to follow these.  If you are not sure, contact your physician.   If you have soreness at the injection site, the application of heat or ice may be helpful. Mild analgesics may also be used.   Steroid injections may cause temporary increase of blood sugar levels.    These instructions have been reviewed with the patient and appropriate questions have answers.  Dana Allan, RTR 07/16/2020 09:08

## 2020-07-16 NOTE — Procedures (Signed)
Lumbar Epidural Steroid Injection with Fluoro    Pre Procedure Dx:  Lumbar stenosis    Post Procedure Dx:  Lumbar stenosis    Guidance:  Fluoroscopy    Level:  L4/5    Skin anesthetic:  64m 1% lido    Injectate:  265mdex and 42m42m25% bupi    Contrast:  4ml68m Consent and timeout were done.  Patient was placed in the prone position.  Skin was prepped and draped in the routine fashion and sterile technique was maintained throughout.    In an AP view the level listed above was identified.  Skin was anesthetized.  Next, a 20 gauge Touhey needle was advanced in the midline until trajectory was established.  Next, a lateral view was obtained.  Once the needle reached the posterior aspect of the spinous processes, loss of resistance technique was utilized.  Once the loss of resistance occurred, contrast was injected between AP and lateral views under live fluoroscopy.  Characteristic epidural spread was noted.  No vascular spread was noted.  After repeat negative aspiration the above solution was injected.  Patient tolerated this very well.  Patient returned to the recovery area with no complaints or apparent complications.    JonaAshok Cordia

## 2020-08-01 ENCOUNTER — Encounter (INDEPENDENT_AMBULATORY_CARE_PROVIDER_SITE_OTHER): Payer: Self-pay | Admitting: Neurological Surgery

## 2020-08-01 ENCOUNTER — Ambulatory Visit: Payer: 59 | Attending: Neurological Surgery | Admitting: Neurological Surgery

## 2020-08-01 ENCOUNTER — Other Ambulatory Visit: Payer: Self-pay

## 2020-08-01 VITALS — BP 156/86 | HR 57 | Temp 98.0°F | Resp 20 | Ht 69.0 in | Wt 289.0 lb

## 2020-08-01 DIAGNOSIS — M25552 Pain in left hip: Secondary | ICD-10-CM

## 2020-08-01 DIAGNOSIS — M48061 Spinal stenosis, lumbar region without neurogenic claudication: Secondary | ICD-10-CM | POA: Insufficient documentation

## 2020-08-01 NOTE — Progress Notes (Signed)
Pain Management    Chief Complaint:  Chief Complaint   Patient presents with   . Low Back Pain       History of Present Illness:  Henry [name] is a 65 y.o. male  80% reduction with LESI.  No side effects.  Pleased with results.    Past Medical History:  Past Medical History:   Diagnosis Date   . Cancer (CMS [facility]) 11/2015    prostate cancer   . Chronic pain    . CPAP (continuous positive airway pressure) dependence     settings unknown   . Deep vein thrombosis (DVT) (CMS [facility])     left leg   . GERD (gastroesophageal reflux disease)     well controlled on omeprazole   . Heartburn    . HTN    . Hyperlipidemia    . Hypertension    . Irritable bowel syndrome    . Neck problem    . Osteoarthritis of back    . Prostate cancer (CMS [place]) 12/11/2015   . Sleep apnea    . Wears glasses        Past Surgical History:  Past Surgical History:   Procedure Laterality Date   . HX ANKLE FRACTURE TX Right    . HX APPENDECTOMY     . 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 Caryl Comes   . PB COLONOSCOPY,DIAGNOSTIC             Social History:  Social History     Socioeconomic History   . Marital status: Married     Spouse name: Not on file   . Number of children: Not on file   . Years of education: Not on file   . Highest education level: Not on file   Occupational History     Employer: [employer]   Tobacco Use   . Smoking status: Former Smoker     Types: Cigars     Quit date: 2012     Years since quitting: 10.4   . Smokeless tobacco: Never Used   . Tobacco comment: very rare cigar   Vaping Use   . Vaping Use: Never used   Substance and Sexual Activity   . Alcohol use: Yes     Alcohol/week: 2.0 standard drinks     Types: 2 Cans of beer per week     Comment: per week   . Drug use: No     Comment: CBD oil, 13-15 drops daily    . Sexual activity: Not on file   Other Topics Concern   . Abuse/Domestic Violence No   . Caffeine Concern No   . Calcium intake adequate Yes   . Computer Use Yes   . Drives Yes   . Exercise Concern No   . Helmet  Use Not Asked   . Seat Belt Yes   . Special Diet No   . Sunscreen used Yes   . Uses Cane Not Asked   . Uses walker Not Asked   . Uses wheelchair Not Asked   . Right hand dominant Yes   . Left hand dominant Not Asked   . Ambidextrous Not Asked   . Shift Work Not Asked   . Unusual Sleep-Wake Schedule Not Asked   . Ability to Walk 1 Flight of Steps without SOB/CP Yes   . Routine Exercise Yes     Comment: [employer], 3-4 times a week, 1 hour,10 min    .  Ability to Walk 2 Flight of Steps without SOB/CP Yes   . Unable to Ambulate Not Asked   . Total Care Not Asked   . Ability To Do Own ADL's Yes   . Uses Walker Not Asked   . Other Activity Level Yes     Comment: 14 steps at home. walks a lot. shoveled snow this winter. limited by pain.   Marland Kitchen Uses Cane Not Asked   Social History Narrative   . Not on file     Social Determinants of Health     Financial Resource Strain: Not on file   Food Insecurity: Not on file   Transportation Needs: Not on file   Physical Activity: Not on file   Stress: Not on file   Intimate Partner Violence: Not on file   Housing Stability: Not on file       Family History:  Family Medical History:     Problem Relation (Age of Onset)    Asthma Father    COPD Father    Cancer Mother, Father    Coronary Artery Disease Mother    Diabetes Mother    High Cholesterol Mother    Hypertension (High Blood Pressure) Mother    Migraines Mother            Medications:    Current Outpatient Medications:   .  Acetaminophen 500 mg Oral Capsule, Take 1 Cap (500 mg total) by mouth Every 6 hours as needed, Disp: , Rfl:   .  atorvastatin (LIPITOR) 40 mg Oral Tablet, Take 1 Tablet (40 mg total) by mouth Once a day, Disp: 90 Tablet, Rfl: 3  .  diazePAM (VALIUM) 5 mg Oral Tablet, 1 po 60 minutes prior to MRI.  Repeat x1 30 minutes prior if still anxious. (Patient taking differently: 1 po 60 minutes prior to MRI.  Repeat x1 30 minutes prior if still anxious.), Disp: 2 Tablet, Rfl: 0  .  diclofenac sodium (VOLTAREN) 75 mg Oral  Tablet, Delayed Release (E.C.), Take 1 Tablet (75 mg total) by mouth Twice daily, Disp: 180 Tablet, Rfl: 3  .  dicyclomine (BENTYL) 20 mg Oral Tablet, Take 1 Tablet (20 mg total) by mouth Four times a day, Disp: 480 Tablet, Rfl: 3  .  diphenhydrAMINE (BENADRYL) 50 mg Oral Capsule, Take 1 capsule by mouth 1 hour prior to CT scan., Disp: 1 Capsule, Rfl: 0  .  Ferrous Sulfate (SLOW RELEASE IRON) 142 mg (45 mg iron) Oral Tablet Sustained Release, Take 1 Tab (142 mg total) by mouth Three times a day as needed, Disp: 90 Tab, Rfl: 2  .  GLUCOS CHOND CPLX ADVANCED ORAL, Take 2 Tabs by mouth Once a day Glucosamine-Chondroitin 1500 mg, Disp: , Rfl:   .  metoprolol tartrate (LOPRESSOR) 75 mg Oral Tablet, Take 1 Tablet (75 mg total) by mouth Twice daily, Disp: 180 Tablet, Rfl: 1  .  metroNIDAZOLE 1 % Gel with Pump, by Apply externally route Once a day, Disp: , Rfl:   .  multivitamin Oral Tablet, Take 1 Tab by mouth Once a day, Disp: , Rfl:   .  Non-Formulary/Special Preparation (MEDICATION HELP), CBD 2 capsules per day , Disp: , Rfl:   .  Omeprazole 20 mg Oral Tablet, Delayed Release (E.C.), Take by mouth Monday Wednesday Friday, Disp: , Rfl:   .  predniSONE (DELTASONE) 50 mg Oral Tablet, Take 1 tablet by mouth 13 hours prior to CT scan, 7 hours prior to CT scan, and 1 hour  prior to CT scan., Disp: 3 Tablet, Rfl: 0    Review of Systems:  Denies constipation or sedation  All other systems negative except for any noted in the HPI    Physical Exam:  Vitals:    08/01/20 1108   BP: (!) 156/86   Pulse: 57   Resp: 20   Temp: 36.7 C (98 F)   SpO2: 98%   Weight: 131 kg (289 lb 0.4 oz)   Height: 1.753 m (5' 9" )   BMI: 42.77         Body mass index is 42.68 kg/m.  Appears stated age  Speech clear  Affect appropriate  Alert  No scleral icterus  Mmm  Adequate peripheral perfusion  Unlabored respirations  No abdominal distention  No edema  No rash  Gait WNL    Imaging:  Reviewed.    Outside Records:  Reviewed.    Assessment:  Lumbar  stenosis  Low back pain  Leg pain  Left hip pain    Plan:  Repeat LESI prn  Being considered for left hip replacement, if not offered could block/ablate    Ashok Cordia, MD

## 2020-08-01 NOTE — Nursing Note (Signed)
Procedure Follow Up  [facility]  [id number]  Chief Complaint   Patient presents with   . Low Back Pain       [place] Pain Rating Scale     On a scale of 0-10, during the past 24 hours, pain has interfered with you usual activity: 4     On a scale of 0-10, during the past 24 hours, pain has interfered with your sleep: 3    On a scale of 0-10, during the past 24 hours, pain has affected your mood: 1     On a scale of 0-10, during the past 24 hours, pain has contributed to your stress: 1     On a scale of 0-10, what is your overall pain Rating: 4      Vitals:    08/01/20 1108   BP: (!) 156/86   Pulse: 57   Resp: 20   Temp: 36.7 C (98 F)   SpO2: 98%   Weight: 131 kg (289 lb 0.4 oz)   Height: 1.753 m (5' 9" )   PainSc:   4   PainLoc: Back     Body mass index is 42.68 kg/m.    New imaging: n/a    Patient is here today S/ P LESI  that was done on 5.31.22 and reports 80% relief that lasted STILL WORKING.    Jacquenette Shone, Ambulatory Care Assistant  08/01/2020, 11:09

## 2020-09-04 ENCOUNTER — Encounter (HOSPITAL_BASED_OUTPATIENT_CLINIC_OR_DEPARTMENT_OTHER): Payer: Self-pay

## 2020-09-16 ENCOUNTER — Other Ambulatory Visit (HOSPITAL_BASED_OUTPATIENT_CLINIC_OR_DEPARTMENT_OTHER): Payer: Self-pay | Admitting: Student in an Organized Health Care Education/Training Program

## 2020-09-16 ENCOUNTER — Ambulatory Visit (HOSPITAL_BASED_OUTPATIENT_CLINIC_OR_DEPARTMENT_OTHER): Payer: Self-pay

## 2020-09-16 DIAGNOSIS — E785 Hyperlipidemia, unspecified: Secondary | ICD-10-CM

## 2020-09-16 DIAGNOSIS — I10 Essential (primary) hypertension: Secondary | ICD-10-CM

## 2020-09-16 DIAGNOSIS — C61 Malignant neoplasm of prostate: Secondary | ICD-10-CM

## 2020-09-16 DIAGNOSIS — R7302 Impaired glucose tolerance (oral): Secondary | ICD-10-CM

## 2020-09-16 NOTE — Telephone Encounter (Signed)
Regarding: Upcoming [facility] Pt// Order request  ----- Message from Velna Hatchet sent at 09/16/2020  9:45 AM EDT -----  Dola Argyle, MD    Pt is re-establishing with Dr. Kathreen Cosier on 8/29 and would like to have these orders placed prior to his appointment; A1C, PSA and any blood work that might be needed . Please send a MyChart message to the Pt when the orders are placed.    Thanks,  Autumn Whisper Pearline Cables  09/16/2020, 09:40

## 2020-09-16 NOTE — Telephone Encounter (Signed)
Called and spoke with patient notified fasting blood work ordered.   Wayland Salinas, RN

## 2020-10-10 ENCOUNTER — Other Ambulatory Visit: Payer: Self-pay

## 2020-10-11 ENCOUNTER — Ambulatory Visit: Payer: 59 | Attending: Student in an Organized Health Care Education/Training Program

## 2020-10-11 DIAGNOSIS — I10 Essential (primary) hypertension: Secondary | ICD-10-CM

## 2020-10-11 DIAGNOSIS — R7302 Impaired glucose tolerance (oral): Secondary | ICD-10-CM | POA: Insufficient documentation

## 2020-10-11 DIAGNOSIS — Z114 Encounter for screening for human immunodeficiency virus [HIV]: Secondary | ICD-10-CM

## 2020-10-11 DIAGNOSIS — C61 Malignant neoplasm of prostate: Secondary | ICD-10-CM

## 2020-10-11 DIAGNOSIS — E785 Hyperlipidemia, unspecified: Secondary | ICD-10-CM | POA: Insufficient documentation

## 2020-10-11 LAB — LIPID PANEL
CHOL/HDL RATIO: 4.4
CHOLESTEROL: 151 mg/dL (ref 100–200)
HDL CHOL: 34 mg/dL — ABNORMAL LOW (ref >=50)
LDL CALC: 65 mg/dL (ref <100)
NON-HDL: 117 mg/dL (ref <=190)
TRIGLYCERIDES: 261 mg/dL — ABNORMAL HIGH (ref <150)
VLDL CALC: 52 mg/dL — ABNORMAL HIGH (ref <30)

## 2020-10-11 LAB — COMPREHENSIVE METABOLIC PANEL, NON-FASTING
ALBUMIN: 4.3 g/dL (ref 3.4–4.8)
ALKALINE PHOSPHATASE: 85 U/L (ref 45–115)
ALT (SGPT): 40 U/L (ref 10–55)
ANION GAP: 15 mmol/L — ABNORMAL HIGH (ref 4–13)
AST (SGOT): 25 U/L (ref 8–45)
BILIRUBIN TOTAL: 0.7 mg/dL (ref 0.3–1.3)
BUN/CREA RATIO: 20 (ref 6–22)
BUN: 17 mg/dL (ref 8–25)
CALCIUM: 9.6 mg/dL (ref 8.8–10.2)
CHLORIDE: 103 mmol/L (ref 96–111)
CO2 TOTAL: 22 mmol/L — ABNORMAL LOW (ref 23–31)
CREATININE: 0.87 mg/dL (ref 0.75–1.35)
ESTIMATED GFR: >90 mL/min/BSA (ref >=60)
GLUCOSE: 117 mg/dL (ref 65–125)
POTASSIUM: 4.2 mmol/L (ref 3.5–5.1)
PROTEIN TOTAL: 6.9 g/dL (ref 5.6–7.6)
SODIUM: 140 mmol/L (ref 136–145)

## 2020-10-11 LAB — HGA1C (HEMOGLOBIN A1C WITH EST AVG GLUCOSE)
ESTIMATED AVERAGE GLUCOSE: 126 mg/dL
HEMOGLOBIN A1C: 6 % — ABNORMAL HIGH (ref 4.0–5.6)

## 2020-10-11 LAB — PSA DIAGNOSTIC WITH FREE PSA REFLEX: PSA: 0.35 ng/mL (ref <=4.00)

## 2020-10-14 ENCOUNTER — Other Ambulatory Visit: Payer: Self-pay

## 2020-10-14 ENCOUNTER — Encounter (HOSPITAL_BASED_OUTPATIENT_CLINIC_OR_DEPARTMENT_OTHER): Payer: Self-pay | Admitting: Student in an Organized Health Care Education/Training Program

## 2020-10-14 ENCOUNTER — Ambulatory Visit
Payer: 59 | Attending: Student in an Organized Health Care Education/Training Program | Admitting: Student in an Organized Health Care Education/Training Program

## 2020-10-14 VITALS — BP 148/82 | HR 67 | Temp 98.0°F | Wt 286.6 lb

## 2020-10-14 DIAGNOSIS — Z0001 Encounter for general adult medical examination with abnormal findings: Secondary | ICD-10-CM

## 2020-10-14 DIAGNOSIS — I10 Essential (primary) hypertension: Secondary | ICD-10-CM

## 2020-10-14 DIAGNOSIS — Z0000 Encounter for general adult medical examination without abnormal findings: Secondary | ICD-10-CM

## 2020-10-14 DIAGNOSIS — Z87891 Personal history of nicotine dependence: Secondary | ICD-10-CM

## 2020-10-14 DIAGNOSIS — Z114 Encounter for screening for human immunodeficiency virus [HIV]: Secondary | ICD-10-CM

## 2020-10-14 DIAGNOSIS — Z862 Personal history of diseases of the blood and blood-forming organs and certain disorders involving the immune mechanism: Secondary | ICD-10-CM

## 2020-10-14 DIAGNOSIS — K589 Irritable bowel syndrome without diarrhea: Secondary | ICD-10-CM

## 2020-10-14 DIAGNOSIS — E785 Hyperlipidemia, unspecified: Secondary | ICD-10-CM

## 2020-10-14 DIAGNOSIS — M1990 Unspecified osteoarthritis, unspecified site: Secondary | ICD-10-CM

## 2020-10-14 DIAGNOSIS — R7303 Prediabetes: Secondary | ICD-10-CM

## 2020-10-14 DIAGNOSIS — Z136 Encounter for screening for cardiovascular disorders: Secondary | ICD-10-CM

## 2020-10-14 LAB — HIV1/HIV2 SCREEN, COMBINED ANTIGEN AND ANTIBODY: HIV SCREEN, COMBINED ANTIGEN & ANTIBODY: NEGATIVE

## 2020-10-14 MED ORDER — LOSARTAN 25 MG TABLET
25.0000 mg | ORAL_TABLET | Freq: Every day | ORAL | 1 refills | Status: DC
Start: 2020-10-14 — End: 2021-03-31

## 2020-10-14 MED ORDER — ATORVASTATIN 40 MG TABLET
40.0000 mg | ORAL_TABLET | Freq: Every day | ORAL | 3 refills | Status: DC
Start: 2020-10-14 — End: 2021-11-25

## 2020-10-14 MED ORDER — METOPROLOL TARTRATE 50 MG TABLET
50.0000 mg | ORAL_TABLET | Freq: Two times a day (BID) | ORAL | 0 refills | Status: DC
Start: 2020-10-14 — End: 2021-04-24

## 2020-10-14 MED ORDER — DICLOFENAC SODIUM 75 MG TABLET,DELAYED RELEASE
75.0000 mg | DELAYED_RELEASE_TABLET | Freq: Two times a day (BID) | ORAL | 3 refills | Status: DC
Start: 2020-10-14 — End: 2021-11-24

## 2020-10-14 MED ORDER — DICYCLOMINE 20 MG TABLET
20.0000 mg | ORAL_TABLET | Freq: Four times a day (QID) | ORAL | 3 refills | Status: DC
Start: 2020-10-14 — End: 2021-11-25

## 2020-10-14 NOTE — H&P (Signed)
Department of Family Medicine   History and Physical      Henry [name]  MRN: [id number]  DOB: [birth year]  Date of Service: 10/14/2020    CHIEF COMPLAINT  Chief Complaint   Patient presents with   . Establish Care       HISTORY OF PRESENT ILLNESS  Henry [name] is a 65 y.o. male presenting to clinic to re-establish care.  Former patient of Dr. Angelia Mould.    No acute concerns today.  He would like to go over his blood work.    Past medical history of hypertension, hyperlipidemia, prediabetes, prostate cancer, OSA on CPAP, IBS, and osteoarthritis.    Hypertension:  Currently taking Lopressor 75 mg b.i.d..  Denies any chest pain, shortness a breath, dizziness.  He does check his blood pressure at the gym about once weekly.  He is unable to tell me what numbers these have been.    Hyperlipidemia:  He is taking Lipitor 40 mg daily.  Denies any side effects.    Pre diabetes:  Not currently on any medications.  He has been attempting to watch his sugar intake.  He is trying to eat more fruits and whole grains.    Prostate cancer:  Currently in remission.  Last PSA was within normal limits.  At this point he is not following with Radiation Oncology or Urology.    OSA on CPAP:  No current issues per patient.  Continues to use his CPAP.    IBS:  Concurrently using Metamucil and Bentyl.  Currently under fair control per patient.    Osteoarthritis:  Reports pain in his neck, low back bilateral hips, and bilateral knees.  He has gotten injections through the pain center into his lumbar spine.  He states that this was helpful for a little bit.  He feels like some of his pain is starting to come back.  He feels that CBD capsules and Voltaren seem to do a fair job for his pain.    Socially, he is married without children.  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  He used to smoke cigars and quit in 2012. Will have 1-2 alcoholic beverages a week.  Denies any illicit drug use.      PAST MEDICAL HISTORY  Past  Medical History:   Diagnosis Date   . Cancer (CMS [facility]) 11/2015    prostate cancer   . Chronic pain    . CPAP (continuous positive airway pressure) dependence     settings unknown   . Deep vein thrombosis (DVT) (CMS [facility])     left leg   . GERD (gastroesophageal reflux disease)     well controlled on omeprazole   . Heartburn    . HTN    . Hyperlipidemia    . Hypertension    . Irritable bowel syndrome    . Melanoma (CMS [place])    . Neck problem    . Osteoarthritis of back    . Prostate cancer (CMS [place]) 12/11/2015   . Sleep apnea    . Wears glasses            MEDICATIONS  Acetaminophen 500 mg Oral Capsule, Take 1 Cap (500 mg total) by mouth Every 6 hours as needed  diphenhydrAMINE (BENADRYL) 50 mg Oral Capsule, Take 1 capsule by mouth 1 hour prior to CT scan.  Ferrous Sulfate (SLOW RELEASE IRON) 142 mg (45 mg iron) Oral Tablet Sustained Release, Take 1 Tab (142 mg total) by  mouth Three times a day as needed (Patient taking differently: Take 1 Tablet by mouth Three times a day as needed Takes 3 times week)  GLUCOS CHOND CPLX ADVANCED ORAL, Take 2 Tabs by mouth Once a day Glucosamine-Chondroitin 1500 mg  metroNIDAZOLE 1 % Gel with Pump, by Apply externally route Once a day  multivitamin Oral Tablet, Take 1 Tab by mouth Once a day  Non-Formulary/Special Preparation (MEDICATION HELP), CBD 2 capsules per day   Omeprazole 20 mg Oral Tablet, Delayed Release (E.C.), Take by mouth Monday Wednesday Friday  atorvastatin (LIPITOR) 40 mg Oral Tablet, Take 1 Tablet (40 mg total) by mouth Once a day  diazePAM (VALIUM) 5 mg Oral Tablet, 1 po 60 minutes prior to MRI.  Repeat x1 30 minutes prior if still anxious. (Patient taking differently: 1 po 60 minutes prior to MRI.  Repeat x1 30 minutes prior if still anxious.)  diclofenac sodium (VOLTAREN) 75 mg Oral Tablet, Delayed Release (E.C.), Take 1 Tablet (75 mg total) by mouth Twice daily  dicyclomine (BENTYL) 20 mg Oral Tablet, Take 1 Tablet (20 mg total) by mouth Four times a  day  metoprolol tartrate (LOPRESSOR) 75 mg Oral Tablet, Take 1 Tablet (75 mg total) by mouth Twice daily  predniSONE (DELTASONE) 50 mg Oral Tablet, Take 1 tablet by mouth 13 hours prior to CT scan, 7 hours prior to CT scan, and 1 hour prior to CT scan.    No facility-administered medications prior to visit.      ALLERGIES  Allergies   Allergen Reactions   . Sulfa (Sulfonamides) Rash   . Shellfish Containing Products Itching and Swelling     Shrimp - eye swelling, itching       PAST SURGICAL HISTORY  Past Surgical History:   Procedure Laterality Date   . HX ANKLE FRACTURE TX Right    . HX APPENDECTOMY     . 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 Caryl Comes   . PB COLONOSCOPY,DIAGNOSTIC             IMMUNIZATIONS  Immunization History   Administered Date(s) Administered   . Covid-19 Vaccine,Pfizer-BioNTech,Gray Top,78yr+ 06/27/2020   . Covid-19 Vaccine,Pfizer-BioNTech,Purple Top,14yr 04/18/2019, 05/06/2019, 12/09/2019   . Diptheria & Tetanus Toxoid,Adsorbed, (Decavac/Tenivac) 62YRS & OLDER (Admin) 04/15/1999   . Diptheria/Tetanus,Adsorbed (ADMIN) 01/30/2010   . INFLUENZA VIRUS VACCINE (ADMIN) 01/23/2002, 01/02/2006, 10/30/2010   . Influenza Vaccine, 6 month-adult 01/17/2014, 12/18/2014, 11/24/2016, 12/21/2017, 11/21/2019   . Pneumovax 06/11/2020   . Shingrix - Zoster Vaccine (Admin) 08/27/2016, 10/19/2017   . Tetanus Toxoid/Diphtheria Toxoid/Acellular Pertussis Vaccine, Adsorbed (Tdap) 06/11/2020       FAMILY HISTORY  Family Medical History:     Problem Relation (Age of Onset)    Asthma Father    COPD Father    Cancer Mother, Father    Coronary Artery Disease Mother    Diabetes Mother    High Cholesterol Mother    Hypertension (High Blood Pressure) Mother    Melanoma Brother    Migraines Mother            SOCIAL HISTORY  Social History     Socioeconomic History   . Marital status: Married   . Number of children: 0   Occupational History     Employer: [employer]   . Occupation: retired     Comment: formerly worked  administration in [employer]   . Smoking status: Former Smoker     Types: Cigars  Quit date: 2012     Years since quitting: 10.6   . Smokeless tobacco: Never Used   . Tobacco comment: quit cigars in 2012   Vaping Use   . Vaping Use: Never used   Substance and Sexual Activity   . Alcohol use: Yes     Alcohol/week: 2.0 standard drinks     Types: 2 Cans of beer per week     Comment: per week   . Drug use: No     Comment: capsule   Other Topics Concern   . Abuse/Domestic Violence No   . Caffeine Concern No   . Calcium intake adequate Yes   . Computer Use Yes   . Drives Yes   . Exercise Concern No   . Seat Belt Yes   . Special Diet No   . Sunscreen used Yes   . Right hand dominant Yes   . Ability to Walk 1 Flight of Steps without SOB/CP Yes   . Routine Exercise Yes     Comment: [employer], 3-4 times a week, 1 hour,10 min    . Ability to Walk 2 Flight of Steps without SOB/CP Yes   . Ability To Do Own ADL's Yes   . Other Activity Level Yes     Comment: 14 steps at home. walks a lot. shoveled snow this winter. limited by pain.       REVIEW OF SYSTEMS  Positive ROS discussed in HPI, otherwise all other systems negative.      PHYSICAL EXAM  Vitals: Blood pressure (!) 148/82, pulse 67, temperature 36.7 C (98 F), temperature source Thermal Scan, weight 130 kg (286 lb 9.6 oz), SpO2 96 %. Body mass index is 42.32 kg/m.  General: appears stated age, no acute distress  HEENT: conjunctiva clear; pupils equal and round; normal appearing TMs bilaterally; mouth mucus membranes moist; pharynx appears normal without exudate or erythema  Neck: no thyromegaly or lymphadenopathy  Cardiovascular: RRR, no murmur  Lungs: clear to auscultation bilaterally  Abdomen: soft, non-tender, bowel sounds normal  Genito-urinary: Deferred  Extremities: no cyanosis or edema, tortuous veins present in the BLE  Skin: no rashes or lesions  Neurologic: gait normal, CN 2-12 grossly intact, AOx3  Psychiatric: normal affect and  behavior    ASSESSMENT AND PLAN  (Z00.00) Encounter for medical examination to establish care  (primary encounter diagnosis)  Plan:  EMR reviewed updated    (M19.90) Osteoarthritis  Plan: diclofenac sodium (VOLTAREN) 75 mg Oral Tablet,        Delayed Release (E.C.)  -chronic  -will continue Voltaren 75 mg b.i.d. for now  -patient can use CBD capsules he feels the give him benefit, I did instruct him to stay with the same brand to make sure that he is getting the same dose    (E78.5) Hyperlipidemia, unspecified hyperlipidemia type  Plan: atorvastatin (LIPITOR) 40 mg Oral Tablet, LIPID        PANEL  -triglycerides elevated on recent lab work  -will continue Lipitor 40 mg daily for now  -counseled on appropriate dietary changes to help with cholesterol levels    (Z13.6) Screening for AAA (abdominal aortic aneurysm), (T73.220) Personal history of tobacco use  Plan: ABDOMINAL DUPLEX - AORTA - MEDICARE SCREEN FOR         AAA    (K58.9) Irritable bowel syndrome, unspecified type  Plan: dicyclomine (BENTYL) 20 mg Oral Tablet  -chronic, controlled  -continue Metamucil daily  -continue Bentyl 20 mg 4 times a day p.r.n.    (  Z11.4) Screening for HIV (human immunodeficiency virus)  Plan: HIV1/HIV2 SCREEN, COMBINED ANTIGEN AND ANTIBODY    (R73.03) Prediabetes  Plan:   -A1cs have been hovering in the low 6's  -encouraged dietary changes to help prevent progression to full-blown diabetes    (Z86.2) Hx of iron deficiency anemia  Plan: CBC/DIFF, IRON TRANSFERRIN AND TIBC    (I10) Hypertension, unspecified type  Plan: losartan (COZAAR) 25 mg Oral Tablet, metoprolol        tartrate (LOPRESSOR) 50 mg Oral Tablet  -not controlled today  -will start losartan 25 mg daily  -continue Lopressor 50 mg b.i.d.  -will plan to titrate up losartan, and titrate down Lopressor in the future pending response      Health Maintanence  BMI Screening (yearly 65 yo+)  Appropriate range based off of the patient's age is 23.0-29.9 (65 yo+) 18.5-24.9 (age  30-64 yo).   BMI addressed: Advised on diet, weight loss, and exercise to reduce above normal BMI.    Wt Readings from Last 3 Encounters:   10/14/20 130 kg (286 lb 9.6 oz)   08/01/20 131 kg (289 lb 0.4 oz)   07/16/20 130 kg (287 lb 0.6 oz)      Discussed healthy diet, regular exercise, appropriate weight loss, and referral to dietician.     Type II DM Screening  (yearly 66-70 yo, screen earlier if + risk factors)  Lab Results   Component Value Date    GLUCOSEFAST 110 (H) 05/28/2011      Lab Results   Component Value Date    HA1C 6.0 (H) 10/11/2020       Blood Pressure Screening (yearly 65 yo+)  Goal is < 150/90 (> 85 yo), < 140/90 (< 25 yo), < 120/70 (Medicare).   Elevated Blood Pressure Plan of Care:  Patient already has a diagnosis of hypertension. Will be treated as appropriate.        Dyslipidemia Screening (yearly M 20-35 yo risk, 65 yo+, W 53-45 yo risk, 65 yo+)  Lab Results   Component Value Date    CHOLESTEROL 151 10/11/2020    LDLCHOL 65 10/11/2020    LDLCHOLDIR 65 07/04/2013    TRIG 261 (H) 10/11/2020       Colorectal Cancer Screening (48-75 yo)  Last colonoscopy was performed April 15, 2018.   Repeat due in 7-10 years    Depression Screening (yearly 65 yo+)  PHQ Total Score  PHQ 2 Total: 0         Hepatitis C Screening (1-time born between 1945-1965)  Hepatitis C screening test:  September 10, 2016          Abdominal Aortic Aneurysm  (one-time by [place] 76-75 yo who have ever smoked)  AAA [place] ordered.    Prostate Cancer Screening (every 2+ yrs < 55 if high risk, 13-69 yo)  PROSTATE SPECIFIC ANTIGEN   Lab Results   Component Value Date/Time    PROSSPECAG 0.35 10/11/2020 09:07 AM          Orders Placed This Encounter   . HIV1/HIV2 SCREEN, COMBINED ANTIGEN AND ANTIBODY   . CBC/DIFF   . IRON TRANSFERRIN AND TIBC   . LIPID PANEL   . ABDOMINAL DUPLEX - AORTA - MEDICARE SCREEN FOR AAA   . diclofenac sodium (VOLTAREN) 75 mg Oral Tablet, Delayed Release (E.C.)   . atorvastatin (LIPITOR) 40 mg Oral Tablet   . dicyclomine  (BENTYL) 20 mg Oral Tablet   . losartan (COZAAR) 25 mg Oral Tablet   .  metoprolol tartrate (LOPRESSOR) 50 mg Oral Tablet       Return in about 6 months (around 04/15/2021), or if symptoms worsen or fail to improve.      Dola Argyle, MD 10/14/2020, 08:44

## 2020-10-15 ENCOUNTER — Other Ambulatory Visit: Payer: Self-pay

## 2020-10-16 ENCOUNTER — Telehealth (HOSPITAL_COMMUNITY): Payer: Self-pay

## 2020-10-16 NOTE — Telephone Encounter (Signed)
COVID screening questions reviewed. Reviewed pre procedure instructions, current COVID guidelines and visitation policies. All questions answered. For any questions regarding stress testing please call [phone]

## 2020-10-22 ENCOUNTER — Other Ambulatory Visit: Payer: Self-pay

## 2020-10-22 ENCOUNTER — Ambulatory Visit
Admission: RE | Admit: 2020-10-22 | Discharge: 2020-10-22 | Disposition: A | Discharge status: Home / Self Care | Payer: 59 | Source: Ambulatory Visit | Attending: Student in an Organized Health Care Education/Training Program | Admitting: Student in an Organized Health Care Education/Training Program

## 2020-10-22 DIAGNOSIS — Z136 Encounter for screening for cardiovascular disorders: Secondary | ICD-10-CM | POA: Insufficient documentation

## 2020-10-22 DIAGNOSIS — Z87891 Personal history of nicotine dependence: Secondary | ICD-10-CM

## 2020-10-29 ENCOUNTER — Ambulatory Visit: Payer: 59 | Attending: Family | Admitting: Nurse Practitioner

## 2020-10-29 ENCOUNTER — Other Ambulatory Visit: Payer: Self-pay

## 2020-10-29 ENCOUNTER — Encounter (HOSPITAL_BASED_OUTPATIENT_CLINIC_OR_DEPARTMENT_OTHER): Payer: Self-pay | Admitting: Nurse Practitioner

## 2020-10-29 VITALS — BP 163/83 | HR 63 | Temp 97.6°F | Resp 12 | Ht 69.0 in | Wt 285.5 lb

## 2020-10-29 DIAGNOSIS — Z9989 Dependence on other enabling machines and devices: Secondary | ICD-10-CM | POA: Insufficient documentation

## 2020-10-29 DIAGNOSIS — G4733 Obstructive sleep apnea (adult) (pediatric): Secondary | ICD-10-CM

## 2020-10-29 NOTE — Progress Notes (Signed)
[place]    Patient Name: Henry [name] [facility]  MRN#: [id number]  DOB: 09-25-1955  Date of Service: 10/29/2020    CC: Sleep Apnea        Last Clinic Visit: 10/24/2019    Sleep Disorders Current Therapy  Positive Pressure: CPAP 9 cm H2O    Mask: Zest nasal    Humidifier: heated    Ramp: On    Oxyen: None      HPI: The patient 65 y.o. with obstructive sleep apnea presenting for routine follow up on PAP therapy. The patient reports no concerns or complaints today.  He is deriving great benefit from nightly PAP use.  He is getting his supplies as requested from [place] with no issues.  He is sleeping about 5 hours/night with his mask and does nap on occasion but denies any snoring or apnea symptoms.       Apnea Hypopnea index (diagnostic): ANP 01/23/2010 AHI 53.8 Desat 63%  Titration study 03/03/2010 CPAP 7 cm H2O     Current use of positive pressure therapy: nightly      Problems with Therapy:    Mask: Denies    Machine: Denies    Nasal: Denies congestion or dryness    Epworth Sleepiness Scale: 3        Sleep related symptoms:  Excessive day time sleepiness-Denies  Nonrestorative sleep- Denies  Witnessed apneas by bed partner- Denies  Awakening with choking- Denies  Nocturnal restlessness- Denies  Insomnia with frequent awakenings- Denies  Lack of concentration- Denies  Cognitive deficits- Denies  Changes in mood- Denies  Morning headaches- Denies  Vivid, strange, or threatening dreams- Denies      Sleep structure:   - bedtime: 2300-0000, sleep latency: minutes, sleep maintenance: 5 hours, Frequent night time awakening: 1 x to go to bathroom, usually quick return to sleep, Up: 0530  -Nap: occasionally, 4 x/week, 30 -60 minutes  - excessive caffeine, soda, alcohol intake: Denies        Associated diseases: Obesity Body mass index is 42.16 kg/m., HTN, IBS, GERD, melanoma in June, osteoarthritis        Review of Systems:   Constitutional: (-) fever (-) chills (-) fatigue (-)  unintentional weight loss  Eyes: (-) vision changes (-) itchy or burning eyes  Ears/Nose/Thoat/Mouth: (-) snoring  (-) oral lesions  (-) sore throat (-) nasal congestion  CV: (-) chest pain (-) palpitations (-) edema   Pulm: (-) shortness of breath (-) cough (-) nocturnal gasping  (-) dyspnea on exertion  GI: (-) abl pain (-) nausea (-) vomiting (-) diarrhea (-) constipation  Genitourinary: (-) dysuria (+) nocturia (-) incontinence (-) urgency  Musculoskeletal: (-) joint pain (-) joint swelling (-) muscle pain  Neurologic: (-) memory loss (-) paresthesias (-) dizziness (-) headaches  Skin: (-) rash (-) itching (-) jaundice  Psych: (-) anxiety (-) depression (-) insomnia   Endocrine: (-) heat intolerance (-) cold intolerance  Heme/Lymph: (-) lymphadenopathy (-) easy bruising  Allergy/Immune: (-) hives (-) seasonal allergies       Past Medical History:   Past Medical History:   Diagnosis Date   . Cancer (CMS [facility]) 11/2015    prostate cancer   . Chronic pain    . CPAP (continuous positive airway pressure) dependence     settings unknown   . Deep vein thrombosis (DVT) (CMS [facility])     left leg   . GERD (gastroesophageal reflux disease)  well controlled on omeprazole   . Heartburn    . HTN    . Hyperlipidemia    . Hypertension    . Irritable bowel syndrome    . Melanoma (CMS [place])    . Neck problem    . Osteoarthritis of back    . Prostate cancer (CMS [place]) 12/11/2015   . Sleep apnea    . Wears glasses            Allergies:   Allergies   Allergen Reactions   . Sulfa (Sulfonamides) Rash   . Shellfish Containing Products Itching and Swelling     Shrimp - eye swelling, itching         Current Medications:   Prior to Admission medications    Medication Sig Start Date End Date Taking? Authorizing Provider   Acetaminophen 500 mg Oral Capsule Take 1 Cap (500 mg total) by mouth Every 6 hours as needed 10/07/18   Ricke Hey, MD   atorvastatin (LIPITOR) 40 mg Oral Tablet Take 1 Tablet (40 mg total) by mouth Once a day 10/14/20   Yes Rickards, Zenia Resides, MD   diclofenac sodium (VOLTAREN) 75 mg Oral Tablet, Delayed Release (E.C.) Take 1 Tablet (75 mg total) by mouth Twice daily 10/14/20  Yes Rickards, Zenia Resides, MD   dicyclomine (BENTYL) 20 mg Oral Tablet Take 1 Tablet (20 mg total) by mouth Four times a day 10/14/20  Yes Rickards, Zenia Resides, MD   diphenhydrAMINE (BENADRYL) 50 mg Oral Capsule Take 1 capsule by mouth 1 hour prior to CT scan.  Patient not taking: Reported on 10/29/2020 07/11/20   Ashok Cordia, MD   Ferrous Sulfate (SLOW RELEASE IRON) 142 mg (45 mg iron) Oral Tablet Sustained Release Take 1 Tab (142 mg total) by mouth Three times a day as needed  Patient taking differently: Take 1 Tablet by mouth Three times a day as needed Takes 3 times week 04/26/14  Yes Rosemarie Beath, MD   GLUCOS CHOND CPLX ADVANCED ORAL Take 2 Tabs by mouth Once a day Glucosamine-Chondroitin 1500 mg   Yes Provider, Historical   losartan (COZAAR) 25 mg Oral Tablet Take 1 Tablet (25 mg total) by mouth Once a day 10/14/20  Yes Rickards, Zenia Resides, MD   metoprolol tartrate (LOPRESSOR) 50 mg Oral Tablet Take 1 Tablet (50 mg total) by mouth Twice daily 10/14/20  Yes Rickards, Zenia Resides, MD   metroNIDAZOLE 1 % Gel with Pump by Apply externally route Once a day   Yes Provider, Historical   multivitamin Oral Tablet Take 1 Tab by mouth Once a day   Yes Provider, Historical   Non-Formulary/Special Preparation (MEDICATION HELP) CBD 2 capsules per day    Yes Provider, Historical   Omeprazole 20 mg Oral Tablet, Delayed Release (E.C.) Take by mouth Monday Wednesday Friday   Yes Provider, Historical   atorvastatin (LIPITOR) 40 mg Oral Tablet Take 1 Tablet (40 mg total) by mouth Once a day 12/15/19 10/14/20  Rosemarie Beath, MD   diazePAM (VALIUM) 5 mg Oral Tablet 1 po 60 minutes prior to MRI.  Repeat x1 30 minutes prior if still anxious.  Patient taking differently: 1 po 60 minutes prior to MRI.  Repeat x1 30 minutes prior if still anxious. 05/20/20 10/14/20  Ashok Cordia, MD   diclofenac  sodium (VOLTAREN) 75 mg Oral Tablet, Delayed Release (E.C.) Take 1 Tablet (75 mg total) by mouth Twice daily 12/15/19 10/14/20  Rosemarie Beath, MD   dicyclomine (BENTYL) 20 mg Oral Tablet  Take 1 Tablet (20 mg total) by mouth Four times a day 09/29/19 10/14/20  Rosemarie Beath, MD   metoprolol tartrate (LOPRESSOR) 75 mg Oral Tablet Take 1 Tablet (75 mg total) by mouth Twice daily 06/11/20 10/14/20  Rosemarie Beath, MD   predniSONE (DELTASONE) 50 mg Oral Tablet Take 1 tablet by mouth 13 hours prior to CT scan, 7 hours prior to CT scan, and 1 hour prior to CT scan. 07/11/20 10/14/20  Ashok Cordia, MD         Physical Exam: The patient's current vitals are BP (!) 163/83 (Site: Left, Patient Position: Sitting, Cuff Size: Adult Large)   Pulse 63   Temp 36.4 C (97.6 F) (Thermal Scan)   Resp 12   Ht 1.753 m (5' 9" )   Wt 129 kg (285 lb 7.9 oz)   SpO2 95%   BMI 42.16 kg/m       Constitutional: NAD, appears stated age  Eyes: Conjunctiva clear, no drainage noted.   Oropharynx: Oropharynx Mallampati Class IV, dentition good   Neck: neck supple  Respiratory: breathing comfortably, no distress   Musculoskeletal: Moving all extremities, No observed joint swelling or erythema  Skin: Skin warm and dry without discoloration  Psychiatric: Patient is alert, displaying appropriate behavior and speech     PAP Compliance Report Data Review:  DME: [facility]  Machine Model: 3B  Setting: 9 cm of H2O  Average usage (days): 4 hours 47 minuts  % of sleep >=4 hours use: 76.7%  Avg air leak time: 1.7 LPM  AHI: 0.6      ASSESSMENT/ PLAN:      1. Obstructive Sleep Apnea  *Good response and benefit of therapy  *Apnea well controlled.   *Continue to do well on positive airway pressure therapy with great subjective and objective compliance.  *Advise obtain replacement equipment every three to six months.   *Advised patient do not drive or operate heavy equipment or machinery if fatigued, drowsy or sleepy due to risk of injury.  *Pt informed  comorbid conditions may worsen if sleep apnea is not treated adequately.    *Reviewed manufacturer recommended methods for machine and supply maintenance and cleaning  *Prescription for renewal of PAP supplies sent to DME    2.  Obesity  *Encouraged patient on healthy choices with nutrition and activity    3. Hypertension      RTC: 1 year follow up or prior if patient has issues or problems.    Pt was seen in clinic with Joseph Pierini, FNP-C.       Hilario Quarry, APRN-FNP-BC  10/29/2020, 09:32        Hilario Quarry, APRN, FNP-BC  [facility] Medicine  Department of Medicine  Section of Pulmonary and Sleep Medicine

## 2020-10-30 ENCOUNTER — Encounter (HOSPITAL_BASED_OUTPATIENT_CLINIC_OR_DEPARTMENT_OTHER): Payer: Self-pay | Admitting: Family

## 2020-10-30 NOTE — Nursing Note (Signed)
Sent script to [facility] for CPAP SUPPLIES RENEWAL            CPAP 9cmH2O  Heated humidification  ZEST NASAL  Headgear  Filters  Tubing  Water chamber.Erling Conte, Ambulatory Care Assistant  10/30/2020, 11:49

## 2020-11-04 ENCOUNTER — Other Ambulatory Visit (INDEPENDENT_AMBULATORY_CARE_PROVIDER_SITE_OTHER): Payer: Self-pay | Admitting: Neurological Surgery

## 2020-11-04 ENCOUNTER — Encounter (INDEPENDENT_AMBULATORY_CARE_PROVIDER_SITE_OTHER): Payer: Self-pay | Admitting: Neurological Surgery

## 2020-11-04 DIAGNOSIS — M48061 Spinal stenosis, lumbar region without neurogenic claudication: Secondary | ICD-10-CM

## 2020-11-15 ENCOUNTER — Other Ambulatory Visit: Payer: Self-pay

## 2020-11-15 ENCOUNTER — Ambulatory Visit: Payer: 59 | Attending: Student in an Organized Health Care Education/Training Program

## 2020-11-15 DIAGNOSIS — Z23 Encounter for immunization: Secondary | ICD-10-CM | POA: Insufficient documentation

## 2020-11-15 NOTE — Nursing Note (Signed)
1. Are you 65 years of age or older? yes  2. Have you ever had a severe reaction to a flu shot? no  3. Are you allergic to eggs? no  4. Are you allergic to latex? no  5. Are you allergic to Thimerosol? no  6. Are you experiencing acute illness symptoms or have you been running a fever? no  7. Do you have a medical condition or taking medications that suppress your immune system? no  8. Have you ever had Guillain-Barre syndrome or other neurologic disorder? no  9. Are you pregnant or breastfeeding? no    Immunization administered     Name Date Dose VIS Date Route    Influenza Vaccine, 65+  Incomplete 0.5 mL 09/22/2019 Intramuscular          Barbaraann Rondo, RN 11/15/2020, 09:18

## 2020-11-15 NOTE — Patient Instructions (Signed)
VACCINE INFORMATION FACT SHEET FOR RECIPIENTS AND CAREGIVERS ABOUT COMIRNATY (COVID-19 VACCINE, mRNA), THE PFIZER-BIONTECH COVID-19 VACCINE, AND THE PFIZER-BIONTECH COVID-19 VACCINE, BIVALENT (ORIGINAL AND OMICRON BA.4/BA.5) TO PREVENT CORONAVIRUS DISEASE 2019 (COVID-19) FOR USE IN INDIVIDUALS 65 YEARS OF AGE AND OLDER            You are being offered either COMIRNATY (COVID-19 Vaccine, mRNA), the Pfizer-BioNTech COVID-19 Vaccine, or the Pfizer-BioNTech COVID-19 Vaccine, Bivalent (Original and Omicron BA.4/BA.5), hereafter referred to as the Pfizer-BioNTech COVID-19 Vaccine, Bivalent, to prevent Coronavirus Disease 2019 (COVID-19) caused by SARS-CoV-2.   This Vaccine Information Fact Sheet for Recipients and Caregivers comprises the Fact Sheet for the authorized Pfizer-BioNTech COVID-19 Vaccine and the Pfizer-BioNTech COVID-19 Vaccine, Bivalent, and also includes information about the U.S. Food and Drug Administration (FDA)-licensed vaccine, COMIRNATY (COVID-19 Vaccine, mRNA) for use in individuals 65 years of age and [name].   The FDA-approved COMIRNATY (COVID-19 Vaccine, mRNA) and the Pfizer-BioNTech COVID-19 Vaccine authorized under Emergency Use Authorization (EUA) for individuals 69 years of age and older, when prepared according to their respective instructions for use, can be used interchangeably.2      1 You may receive this Vaccine Information Fact Sheet even if your child is 73 years old. Children who will turn from 11 years to 83 years of age between doses in the primary regimen may receive, for any dose in the primary regimen, either: (1) the Pfizer-BioNTech COVID-19 Vaccine authorized for use in individuals 5 through 65 years of age; or (2) COMIRNATY (COVID-19 Vaccine, mRNA) or the Pfizer-BioNTech COVID-19 Vaccine authorized for use in individuals 23 years of age and older.   2 When prepared according to their respective instructions for use, the FDA-approved COMIRNATY (COVID-19 Vaccine, mRNA) and  the EUA-authorized Pfizer-BioNTech COVID-19 Vaccine for individuals 46 years of age and older can be used interchangeably without presenting any safety or effectiveness concerns.        COMIRNATY (COVID-19 Vaccine, mRNA) is an FDA-approved COVID-19   vaccine made by [employer] for [employer]. It is approved as a 2-dose series for   prevention of COVID-19 in individuals 31 years of age and older. It is also   authorized under EUA to provide:   . a third primary series dose to individuals 41 years of age and older with certain kinds of immunocompromise.     The Pfizer-BioNTech COVID-19 Vaccine has received EUA from FDA to   provide:   . a 2-dose primary series to individuals 72 years of age and older; and   . a third primary series dose to individuals 63 years of age and older with   certain kinds of immunocompromise.   The Pfizer-BioNTech COVID-19 Vaccine, Bivalent has received EUA from FDA to provide either:   . a single booster dose to individuals 30 years of age and older at least 2 months after completion of primary vaccination with any authorized or approved monovalent3 COVID-19 vaccine; or   . a single booster dose to individuals 32 years of age and older at least 2 months after receipt of the most recent booster dose with any authorized or approved monovalent COVID-19 vaccine.   This Vaccine Information Fact Sheet contains information to help you understand the   risks and benefits of COMIRNATY (COVID-19 Vaccine, mRNA) and the   Pfizer-BioNTech COVID-19 Vaccine, which you may receive because there is currently   a pandemic of COVID-19. Talk to your vaccination provider if you have questions.    This Fact Sheet may have been updated. For  the most recent Fact Sheet, please see   www.TripMetro.hu.     WHAT YOU NEED TO KNOW BEFORE YOU GET THIS VACCINE    WHAT IS COVID-19?  COVID-19 disease is caused by a coronavirus called SARS-CoV-2. You can get   COVID-19 through contact with another person who has the virus. It is  predominantly a   respiratory illness that can affect other organs. People with COVID-19 have had a wide   range of symptoms reported, ranging from mild symptoms to severe illness leading to   death. Symptoms may appear 2 to 14 days after exposure to the virus. Symptoms may   include: fever or chills; cough; shortness of breath; fatigue; muscle or body aches;   headache; new loss of taste or smell; sore throat; congestion or runny nose; nausea or   vomiting; diarrhea.            HOW ARE COMIRNATY (COVID-19 VACCINE, mRNA), THE PFIZER-BIONTECH COVID-19 VACCINE, AND THE PFIZER-BIONTECH COVID-19 VACCINE, BIVALENT RELATED?   COMIRNATY (COVID-19 Vaccine, mRNA) and the Pfizer-BioNTech COVID-19 Vaccine, when prepared according to their respective instructions for use, can be used interchangeably. The Pfizer-BioNTech COVID-19 Vaccine, Bivalent is made in contains an Omicron component to help prevent COVID-19 caused by the Omicron variant of SARS-CoV-2. the same way as COMIRNATY and Pfizer-BioNTech COVID-19 Vaccine but it also contains an Omicron component to help prevent COVID-19 caused by the Omicron variant of SARS-CoV-2.   For more information on EUA, see the "What is an Emergency Use Authorization   (EUA)?" section at the end of this Fact Sheet.    WHAT SHOULD YOU MENTION TO YOUR VACCINATION PROVIDER BEFORE   YOU GET THE VACCINE?  Tell the vaccination provider about all of your medical conditions, including if   you:  . have any allergies   . have had myocarditis (inflammation of the heart muscle) or pericarditis   (inflammation of the lining outside the heart)  . have a fever  . have a bleeding disorder or are on a blood thinner  . are immunocompromised or are on a medicine that affects your immune system  . are pregnant or plan to become pregnant  . are breastfeeding  . have received another COVID-19 vaccine  . have ever fainted in association with an injection    HOW IS THE VACCINE GIVEN?  The Pfizer-BioNTech COVID-19  Vaccine, the Pfizer-BioNTech COVID-19 Vaccine, Bivalent, or COMIRNATY (COVID-19 Vaccine, mRNA) will be given to you as an injection into the muscle.   Primary Series: The Pfizer-BioNTech COVID-19 Vaccine and COMIRNATY (COVID-19 Vaccine, mRNA) are given for the primary series. The vaccine is administered as a 2-dose series, 3 weeks apart. A third primary series dose may be administered at least 4 weeks after the second dose to individuals with certain kinds of immunocompromise.   Booster Dose: Pfizer-BioNTech COVID-19 Vaccine, Bivalent is administered as a single booster dose at least 2 months after: . completion of primary vaccination with any authorized or approved monovalent COVID-19 vaccine; or . receipt of the most recent booster dose with any authorized or approved monovalent COVID-19 vaccine   The vaccine may not protect everyone.   WHO SHOULD NOT GET COMIRNATY (COVID-19 VACCINE, mRNA), THE PFIZER-BIONTECH COVID-19 VACCINE, OR THE PFIZER-BIONTECH COVID-19 VACCINE, BIVALENT?   You should not get any of these vaccines if you: . had a severe allergic reaction after a previous dose of COMIRNATY (COVID-19 Vaccine, mRNA) or the Pfizer-BioNTech COVID-19 Vaccine . had a severe allergic reaction to any  ingredient in these vaccines.     WHAT ARE THE INGREDIENTS IN THESE VACCINES?   COMIRNATY (COVID-19 Vaccine, mRNA), Pfizer-BioNTech COVID-19 Vaccine, and Pfizer-BioNTech COVID-19 Vaccine, Bivalent include the following ingredients:   . mRNA and lipids (((4-hydroxybutyl)azanediyl)bis(hexane-6,1-diyl)bis(2  hexyldecanoate), 2 [(polyethylene glycol)-2000]-N,N-ditetradecylacetamide,   1,2-Distearoyl-sn-glycero-3-phosphocholine, and cholesterol).   Pfizer-BioNTech COVID-19 Vaccine for individuals 69 years of age and older contains 1 of the following sets of additional ingredients; ask the vaccination provider which version is being administered:   . potassium chloride, monobasic potassium phosphate, sodium chloride, dibasic  sodium phosphate dihydrate, and sucrose OR. tromethamine, tromethamine hydrochloride, and sucrose     WHAT ARE THE INGREDIENTS IN THE VACCINES?  COMIRNATY (COVID-19 Vaccine, mRNA) and the authorized formulations of the   vaccine include the following ingredients:   . mRNA and lipids ((4-hydroxybutyl)azanediyl)bis(hexane-6,1-diyl)bis(2-  hexyldecanoate), 2 [(polyethylene glycol)-2000]-N,N-ditetradecylacetamide, 1,2-  Distearoyl-sn-glycero-3-phosphocholine, and cholesterol).    Pfizer-BioNTech COVID-19 vaccines for individuals 46 years of age and older contain   1 of the following sets of additional ingredients; ask the vaccination provider which   version is being administered:  . potassium chloride, monobasic potassium phosphate, sodium chloride, dibasic   sodium phosphate dihydrate, and sucrose  OR  . tromethamine, tromethamine hydrochloride, and sucrose    Pfizer-BioNTech COVID-19 Vaccine, Bivalent for individuals 46 years of age and older contains the following additional ingredients:   . tromethamine, tromethamine hydrochloride, and sucrose       COMIRNATY (COVID-19 Vaccine, mRNA) contains 1 of the following sets of additional ingredients; ask the vaccination provider which version is being administered:   . potassium chloride, monobasic potassium phosphate, sodium chloride, dibasic sodium phosphate dihydrate, and sucrose OR  . tromethamine, tromethamine hydrochloride, and sucrose   HAVE THESE VACCINES BEEN USED BEFORE?   In clinical trials, approximately 23,000 individuals 18 years of age and older have received at least 1 dose of Pfizer-BioNTech COVID-19 Vaccine. Millions of individuals have received the Pfizer-BioNTech COVID-19 Vaccine under EUA since January 27, 2019.   In a clinical trial, approximately 300 individuals greater than 25 years of age received one dose of a bivalent vaccine that differs from the Pfizer-BioNTech COVID-19 Vaccine, Bivalent in that it contains a different Omicron component.    WHAT ARE THE BENEFITS OF THESE VACCINES?   COMIRNATY (COVID-19 Vaccine, mRNA) and the Pfizer-BioNTech COVID-19 Vaccine have been shown to prevent COVID-19. FDA has authorized Pfizer-BioNTech COVID-19 Vaccine, Bivalent to provide better protection against COVID-19 caused by the Omicron variant of SARS-CoV-2.   The duration of protection against COVID-19 is currently unknown.           WHAT ARE THE RISKS OF THE VACCINE?  WHAT ARE THE RISKS OF THESE VACCINES?     There is a remote chance that these vaccines could cause a severe allergic reaction. A severe allergic reaction would usually occur within a few minutes to 1 hour after getting a dose. For this reason, your vaccination provider may ask you to stay at the place where you received your vaccine for monitoring after vaccination. Signs of a severe allergic reaction can include:   Marland Kitchen Difficulty breathing   . Swelling of your face and throat   . A fast heartbeat   . A bad rash all over your body   . Dizziness and weakness          Myocarditis (inflammation of the heart muscle) and pericarditis (inflammation of the lining outside the heart) have occurred in some people who have received COMIRNATY (COVID-19 Vaccine, mRNA) or  Pfizer-BioNTech COVID-19 Vaccine, more commonly in adolescent males and adult males under 6 years of age than among females and older males. In most of these people, symptoms began within a few days following receipt of the second dose of vaccine. The chance of having this occur is very low. You should seek medical attention right away if you have any of the following symptoms after receiving the vaccine:   . Chest pain   . Shortness of breath   . Feelings of having a fast-beating, fluttering, or pounding heart   . Chest pain  . Shortness of breath  . Feelings of having a fast-beating, fluttering, or pounding heart    Side effects that have been reported with these vaccines include:   . Severe allergic reactions   . Non-severe allergic reactions  such as rash, itching, hives, or swelling of the face   . Myocarditis (inflammation of the heart muscle)   . Pericarditis (inflammation of the lining outside the heart)   . Injection site pain   . Tiredness   . Headache   . Muscle pain   . Chills   . Joint pain   . Fever   . Injection site swelling   . Injection site redness   . Nausea   . Feeling unwell   . Swollen lymph nodes (lymphadenopathy)   . Decreased appetite   . Diarrhea   . Vomiting   . Arm pain   . Fainting in association with injection of the vaccine   These may not be all the possible side effects of these vaccines. Serious and unexpected side effects may occur. The possible side effects of these vaccines are still being studied.         WHAT SHOULD I DO ABOUT SIDE EFFECTS?  If you experience a severe allergic reaction, call 9-1-1, or go to the nearest hospital.    Call the vaccination provider or your healthcare provider if you have any side effects   that bother you or do not go away.    Report vaccine side effects to FDA/CDC Vaccine Adverse Event Reporting System (VAERS). The VAERS toll-free number is 650-698-1487 or report online to [email or web address].[email or web address]://www.[email or web address]/. Please include either "COMIRNATY (COVID-19 Vaccine, mRNA)", "Pfizer-BioNTech COVID-19 Vaccine EUA", or "Pfizer-BioNTech COVID-19 Vaccine, Bivalent EUA" as appropriate, in the first line of box #18 of the report form.   In addition, you can report side effects to [employer]. at the contact information provided   Below.      You may also be given an option to enroll in v-safe. V-safe is a new voluntary   smartphone-based tool that uses text messaging and web surveys to check in with   people who have been vaccinated to identify potential side effects after COVID-19   vaccination. V-safe asks questions that help CDC monitor the safety of COVID-19   vaccines. V-safe also provides second-dose reminders if needed and live telephone   follow-up by CDC if participants report a  significant health impact following COVID-19   vaccination. For more information on how to sign up, [email or web address]      WHAT IF I DECIDE NOT TO GET COMIRNATY (COVID-19 VACCINE, mRNA), THE PFIZER-BIONTECH COVID-19 VACCINE, OR THE PFIZER-BIONTECH COVID-19 VACCINE, BIVALENT?   Under the EUA, it is your choice to receive or not receive any of these vaccines. Should you decide not to receive any of these vaccines, it will not change your standard medical care.   ARE OTHER CHOICES AVAILABLE FOR PREVENTING COVID-19 BESIDES  COMIRNATY (COVID-19 VACCINE, mRNA), THE PFIZER-BIONTECH COVID-19 VACCINE, OR THE PFIZER-BIONTECH COVID-19 VACCINE, BIVALENT?   For primary vaccination, another choice for preventing COVID-19 is SPIKEVAX (COVID-19 Vaccine, mRNA), an FDA-approved COVID-19 vaccine. Other vaccines to prevent COVID-19 may be available under EUA, including bivalent vaccines that contain an Omicron component of SARS-CoV-2.             CAN I RECEIVE COMIRNATY (COVID-19 VACCINE, mRNA), PFIZER-BIONTECH COVID-19 VACCINE, OR THE PFIZER-BIONTECH COVID-19 VACCINE, BIVALENT AT THE SAME TIME AS OTHER VACCINES?   Data have not yet been submitted to FDA on administration of COMIRNATY (COVID-19 Vaccine, mRNA), the Pfizer-BioNTech COVID-19 Vaccine, or the Pfizer-BioNTech COVID-19 Vaccine, Bivalent at the same time with other vaccines. If you are considering receiving COMIRNATY (COVID-19 Vaccine, mRNA), the Pfizer-BioNTech COVID-19 Vaccine, or the Pfizer-BioNTech COVID-19 Vaccine, Bivalent with other vaccines, discuss your options with your healthcare provider.   .WHAT IF I AM IMMUNOCOMPROMISED?   If you are immunocompromised, you may receive a third primary series dose of Pfizer-BioNTech COVID-19 Vaccine or COMIRNATY (COVID-19 Vaccine, mRNA). Individuals 47 years of age and older may receive a booster dose with Pfizer-BioNTech COVID-19 Vaccine, Bivalent. Vaccinations may not provide full immunity to COVID-19 in people who are  immunocompromised, and you should continue to maintain physical precautions to help prevent COVID-19. Your close contacts should be vaccinated as appropriate.   WHAT IF I AM PREGNANT OR BREASTFEEDING?   If you are pregnant or breastfeeding, discuss your options with your healthcare provider.   WILL THESE VACCINES GIVE ME COVID-19?   No. These vaccines do not contain SARS-CoV-2 and cannot give you COVID-19.   KEEP YOUR VACCINATION CARD   When you get your first COVID-19 vaccine, you will get a vaccination card. Remember to bring your card when you return.         ADDITIONAL INFORMATION   If you have questions, visit the website or call the telephone number provided below.    To access the most recent Fact Sheets, please scan the QR code provided below.      WHERE WILL MY VACCINATION INFORMATION BE RECORDED?   The vaccination provider may include your vaccination information in your state/local   jurisdiction's Immunization Information System (IIS) or other designated system. This   will ensure that you receive the same vaccine when you return for the second dose.  For more information about IISs visit: [email or web address].             HOW CAN I LEARN MORE?   Marland Kitchen Ask the vaccination provider.   . Visit CDC at BeginnerSteps.be.   . Visit FDA at DirectoryExclusive.com.cy  legal-regulatory-and-policy-framework/emergency-use-authorization.   Minette Brine your local or state public health department.   WHERE WILL MY VACCINATION INFORMATION BE RECORDED?   The vaccination provider may include your vaccination information in your state/local jurisdiction's Immunization Information System (IIS) or other designated system. This will ensure that you receive the same vaccine when you return for the second dose of the primary series. For more information about IISs visit:   [email or web address].   CAN I  BE CHARGED AN ADMINISTRATION FEE FOR RECEIPT OF THESE COVID-19 VACCINES?   No. At this time, the provider cannot charge you for a vaccine dose and you cannot be charged an out-of-pocket vaccine administration fee or any other fee if only receiving a COVID-19 vaccination. However, vaccination providers may seek appropriate reimbursement from a program or plan that covers COVID-19 vaccine administration fees for the vaccine recipient (private insurance, Medicare, [place],  [place] [HRSA] COVID-19 Uninsured Program for non-insured recipients).     WHERE CAN I REPORT CASES OF SUSPECTED FRAUD?   Individuals becoming aware of any potential violations of the CDC COVID-19 Vaccination Program requirements are encouraged to report them to the Office of the [employer], U.S. Department of Health and [employer], at 1-800-HHS-TIPS or https://TIPS.HHS.GOV.   WHAT IS THE COUNTERMEASURES INJURY COMPENSATION PROGRAM?   The Countermeasures Injury Compensation Program (CICP) is a federal program that may help pay for costs of medical care and other specific expenses of certain people who have been seriously injured by certain medicines or vaccines, including these vaccines. Generally, a claim must be submitted to the CICP within one (1) year from the date of receiving the vaccine. To learn more about this program, visit https://www.bennett.info/ or call 313-813-4913.   WHAT IS AN EMERGENCY USE AUTHORIZATION (EUA)?   An EUA is a mechanism to facilitate the availability and use of medical products, including vaccines, during public health emergencies, such as the current COVID-19 pandemic. An EUA is supported by a Risk manager (HHS) declaration that circumstances exist to justify the emergency use of drugs and biological products during the COVID-19 pandemic. A product authorized for emergency use has not undergone the same type of review by FDA as an FDA-approved  product.     FDA may issue an EUA when certain criteria are met, which includes that there are no adequate, approved, and available alternatives. In addition, the FDA decision is based on the totality of the scientific evidence available showing that the product may be effective to prevent COVID-19 during the COVID-19 pandemic and that the known and potential benefits of the product outweigh the known and potential risks of the product. All of these criteria must be met to allow for the product to be used during the COVID-19 pandemic.   An EUA is in effect for the duration of the COVID-19 EUA declaration justifying emergency use of this product, unless terminated or revoked (after which the product may no longer be used).    Revised August 31,2022

## 2020-12-10 ENCOUNTER — Other Ambulatory Visit: Payer: Self-pay

## 2020-12-10 ENCOUNTER — Encounter (INDEPENDENT_AMBULATORY_CARE_PROVIDER_SITE_OTHER): Payer: Self-pay | Admitting: Neurological Surgery

## 2020-12-10 ENCOUNTER — Ambulatory Visit: Payer: 59 | Attending: Neurological Surgery | Admitting: Neurological Surgery

## 2020-12-10 VITALS — Temp 97.9°F | Wt 286.8 lb

## 2020-12-10 DIAGNOSIS — M25559 Pain in unspecified hip: Secondary | ICD-10-CM | POA: Insufficient documentation

## 2020-12-10 DIAGNOSIS — M48061 Spinal stenosis, lumbar region without neurogenic claudication: Secondary | ICD-10-CM | POA: Insufficient documentation

## 2020-12-10 DIAGNOSIS — M545 Low back pain, unspecified: Secondary | ICD-10-CM | POA: Insufficient documentation

## 2020-12-10 NOTE — Procedures (Signed)
[facility]  [place] [address]  Operated by [facility]  Procedure Note    Name: Aws Shere MRN:  [id number]   Date: 12/10/2020 Age: 65 y.o.       Procedures    Ashok Cordia, MD      Lumbar Epidural Steroid Injection with Fluoro    Pre Procedure Dx:  Lumbar stenosis    Post Procedure Dx:  Lumbar stenosis    Guidance:  Fluoroscopy    Level:  L4/5    Skin anesthetic:  24m 1% lido    Injectate:  28mdex and 79m26m25% bupi    Contrast:  4ml379m Consent and timeout were done.  Patient was placed in the prone position.  Skin was prepped and draped in the routine fashion and sterile technique was maintained throughout.    In an AP view the level listed above was identified.  Skin was anesthetized.  Next, a 20 gauge Touhey needle was advanced in the midline until trajectory was established.  Next, a lateral view was obtained.  Once the needle reached the posterior aspect of the spinous processes, loss of resistance technique was utilized.  Once the loss of resistance occurred, contrast was injected between AP and lateral views under live fluoroscopy.  Characteristic epidural spread was noted.  No vascular spread was noted.  After repeat negative aspiration the above solution was injected.  Patient tolerated this very well.  Patient returned to the recovery area with no complaints or apparent complications.    JonaAshok Cordia

## 2020-12-10 NOTE — Nursing Note (Signed)
Pain and Function:     [place] Pain Rating Scale     On a scale of 0-10, during the past 24 hours, pain has interfered with you usual activity: 7     On a scale of 0-10, during the past 24 hours, pain has interfered with your sleep: 2    On a scale of 0-10, during the past 24 hours, pain has affected your mood: 3     On a scale of 0-10, during the past 24 hours, pain has contributed to your stress: 5     On a scale of 0-10, what is your overall pain Rating: 7

## 2020-12-10 NOTE — Patient Instructions (Signed)
[facility]  [address]  [place] [address]  Dept: [phone]  Dept Fax: [phone]  [phone]                                                 SPECIAL PROCEDURES                                     DISCHARGE FORM                                          [phone]      Please follow the instructions listed below for your procedures.  If you have questions concerning your procedure, you may call and leave a message.  Messages will be returned by the end of the next business day.  If you have an emergency, proceed to your local Emergency Department.      PROCEDURE: LUMBAR EPIDURAL STEROID INJECTION    Do not drive a car or operate machinery until tomorrow.  Rest today and return to normal activities tomorrow.  If you are on restricted activities by your physician, please continue to follow these.  If you are not sure, contact your physician.  It is possible to experience mild numbness of the lower back and legs.  This is temporary.  If you have soreness at the injection site, the application of heat or ice may be helpful. Mild analgesics may also be used.  In case of severe headache; lie flat to decrease it.  Increase all fluids especially those with caffeine.  Mild analgesics are also appropriate.  If headache is not relieved by these measures, contact the Pain Clinic.  Steroid injections may cause temporary increase of blood sugar levels.    These instructions have been reviewed with the patient and appropriate questions have answers.  Wandalee Ferdinand, LPN [id number] 30:94

## 2020-12-20 ENCOUNTER — Ambulatory Visit (HOSPITAL_BASED_OUTPATIENT_CLINIC_OR_DEPARTMENT_OTHER): Payer: Self-pay | Admitting: Student in an Organized Health Care Education/Training Program

## 2020-12-20 ENCOUNTER — Ambulatory Visit (HOSPITAL_BASED_OUTPATIENT_CLINIC_OR_DEPARTMENT_OTHER): Payer: Self-pay

## 2020-12-20 NOTE — Telephone Encounter (Signed)
Regarding: [id number] pt// sick  ----- Message from [place] sent at 12/19/2020  4:59 PM EDT -----  Dola Argyle, MD    Pt has head congestion, clear mucus, cough. He tested negative for COVID. Would like to know if something can be prescribed?    [id number] [place], [place] [address] [place] [address] Phone: [phone]: [phone]   Thank you so much!  JRoselyn Reef

## 2020-12-20 NOTE — Telephone Encounter (Signed)
Called and spoke with patient reports that he is feeling better today. No need for office visit will follow up PRN.   Wayland Salinas, RN

## 2021-02-11 ENCOUNTER — Ambulatory Visit (HOSPITAL_BASED_OUTPATIENT_CLINIC_OR_DEPARTMENT_OTHER): Payer: Self-pay | Admitting: Student in an Organized Health Care Education/Training Program

## 2021-02-11 NOTE — Telephone Encounter (Signed)
Regarding: covid  ----- Message from Laurann [place] sent at 02/11/2021  7:45 AM EST -----  Dola Argyle, MD     Hello!  The pt had a positive home test for covid and would like to speak with a nurse. Please advise the pt at [phone]     Thanks!  [employer]

## 2021-02-12 ENCOUNTER — Other Ambulatory Visit: Payer: Self-pay

## 2021-02-12 ENCOUNTER — Ambulatory Visit: Payer: 59 | Attending: Medical | Admitting: Medical

## 2021-02-12 DIAGNOSIS — U071 COVID-19: Secondary | ICD-10-CM

## 2021-02-12 MED ORDER — NIRMATRELVIR 300 MG (150 MG X2)-RITONAVIR 100 MG TABLET,DOSE PACK
3.0000 | ORAL_TABLET | Freq: Two times a day (BID) | ORAL | 0 refills | Status: AC
Start: 2021-02-12 — End: 2021-02-17

## 2021-02-12 NOTE — Progress Notes (Signed)
FAMILY MEDICINE, [place] [facility]  [place] [address]  Operated by [facility]  Telephone Visit    Name:  Khizar Fiorella MRN: [id number]   Date:  02/12/2021 Age:   65 y.o.     The patient/family initiated a request for telephone service.  Verbal consent for this service was obtained from the patient/family.    Last office visit in this department: 10/14/2020      Reason for call: COVID-19  Call notes:  Patient stated he started not feeling well on 02/09/2021. He was having "scratchy throat" and runny nose. He used a home COVID-19 test and was positive. He also had a fever, which has resolved. He stated he has HTN and HLD and was inquiring about Paxlovid treatment.       ICD-10-CM    1. COVID-19  U07.1         PLAN:  - Given age and comorbidities, will treat with Paxlovid BID x 5 days   - Continue OTC symptomatic treatment and supportive care  - Follow up if symptoms worsen or fail to improve      Total provider time spent with the patient on the phone: 10 minutes.    Tyrone Schimke, PA-C

## 2021-03-29 ENCOUNTER — Encounter (HOSPITAL_BASED_OUTPATIENT_CLINIC_OR_DEPARTMENT_OTHER): Payer: Self-pay | Admitting: Student in an Organized Health Care Education/Training Program

## 2021-03-30 ENCOUNTER — Other Ambulatory Visit (HOSPITAL_BASED_OUTPATIENT_CLINIC_OR_DEPARTMENT_OTHER): Payer: Self-pay | Admitting: Student in an Organized Health Care Education/Training Program

## 2021-03-30 DIAGNOSIS — I10 Essential (primary) hypertension: Secondary | ICD-10-CM

## 2021-03-31 ENCOUNTER — Ambulatory Visit (HOSPITAL_BASED_OUTPATIENT_CLINIC_OR_DEPARTMENT_OTHER): Payer: Self-pay | Admitting: Student in an Organized Health Care Education/Training Program

## 2021-03-31 ENCOUNTER — Encounter (HOSPITAL_BASED_OUTPATIENT_CLINIC_OR_DEPARTMENT_OTHER): Payer: Self-pay | Admitting: Student in an Organized Health Care Education/Training Program

## 2021-03-31 ENCOUNTER — Other Ambulatory Visit: Payer: Self-pay

## 2021-03-31 ENCOUNTER — Ambulatory Visit
Payer: 59 | Attending: Student in an Organized Health Care Education/Training Program | Admitting: Student in an Organized Health Care Education/Training Program

## 2021-03-31 VITALS — BP 140/88 | HR 72 | Temp 97.4°F | Ht 69.0 in | Wt 283.1 lb

## 2021-03-31 DIAGNOSIS — K219 Gastro-esophageal reflux disease without esophagitis: Secondary | ICD-10-CM | POA: Insufficient documentation

## 2021-03-31 DIAGNOSIS — R0789 Other chest pain: Secondary | ICD-10-CM | POA: Insufficient documentation

## 2021-03-31 DIAGNOSIS — Z9989 Dependence on other enabling machines and devices: Secondary | ICD-10-CM | POA: Insufficient documentation

## 2021-03-31 DIAGNOSIS — I10 Essential (primary) hypertension: Secondary | ICD-10-CM | POA: Insufficient documentation

## 2021-03-31 DIAGNOSIS — G4733 Obstructive sleep apnea (adult) (pediatric): Secondary | ICD-10-CM | POA: Insufficient documentation

## 2021-03-31 LAB — ECG 12 LEAD - (AMB USE ONLY)(MUSE, IN CLINIC)
Atrial Rate: 62 {beats}/min
Calculated P Axis: 44 degrees
Calculated R Axis: 17 degrees
Calculated T Axis: 64 degrees
PR Interval: 214 ms
QRS Duration: 90 ms
QT Interval: 420 ms
QTC Calculation: 426 ms
Ventricular rate: 62 {beats}/min

## 2021-03-31 MED ORDER — LOSARTAN 50 MG TABLET
50.0000 mg | ORAL_TABLET | Freq: Every day | ORAL | 3 refills | Status: DC
Start: 2021-03-31 — End: 2021-06-05

## 2021-03-31 NOTE — Progress Notes (Unsigned)
Department of Family Medicine   Progress Note    Dajaun Goldring  MRN: [id number]  DOB: 07/08/1955  Date of Service: 03/31/2021    CHIEF COMPLAINT  Chief Complaint   Patient presents with   . Chest Pain      Chest pressure       SUBJECTIVE  Henry [name] is a 66 y.o. male who presents to clinic for chest pressure.     He reports that for the last 7 days he has had a feeling like he swallowed something and got caught in underneath his sternum.  He reports it is intermittently been uncomfortable and has not gone away fully.  He reports that over the weekend it did seem to be getting better but was much worse this morning.  He does feel he has a sensation of something moving in his chest.  Denies any shortness of breath or diaphoresis.  He has continued to be active over the weekend and exercise multiple times without any worsening of his chest discomfort.  He has no radiation of the pressure into his left arm or jaw.  He reports some of the pressure was relieved with belching and passing gas.    He does have a history of acid reflux for reports that this is not been worsened with things.  He recently has been taking Prilosec 20 mg 3 times a week.      Hypertension:  Reports he has been taking losartan and Lopressor.  He has a list of his blood pressures both taken at home and in clinic with most blood pressures in the high 130s to 140s over 80s to 90s.    Review of Systems:  Positive ROS discussed in HPI, otherwise all other systems negative.      Medications:   Acetaminophen 500 mg Oral Capsule, Take 1 Cap (500 mg total) by mouth Every 6 hours as needed  atorvastatin (LIPITOR) 40 mg Oral Tablet, Take 1 Tablet (40 mg total) by mouth Once a day  diclofenac sodium (VOLTAREN) 75 mg Oral Tablet, Delayed Release (E.C.), Take 1 Tablet (75 mg total) by mouth Twice daily  dicyclomine (BENTYL) 20 mg Oral Tablet, Take 1 Tablet (20 mg total) by mouth Four times a day  Ferrous Sulfate (SLOW RELEASE IRON) 142 mg (45 mg  iron) Oral Tablet Sustained Release, Take 1 Tab (142 mg total) by mouth Three times a day as needed (Patient taking differently: Take 1 Tablet (142 mg total) by mouth Three times a day as needed Takes 3 times week)  GLUCOS CHOND CPLX ADVANCED ORAL, Take 2 Tabs by mouth Once a day Glucosamine-Chondroitin 1500 mg  metoprolol tartrate (LOPRESSOR) 50 mg Oral Tablet, Take 1 Tablet (50 mg total) by mouth Twice daily  metroNIDAZOLE 1 % Gel with Pump, by Apply externally route Once a day  multivitamin Oral Tablet, Take 1 Tablet by mouth Once a day  Non-Formulary/Special Preparation (MEDICATION HELP), CBD 2 capsules per day   Omeprazole 20 mg Oral Tablet, Delayed Release (E.C.), Take by mouth Monday Wednesday Friday  diphenhydrAMINE (BENADRYL) 50 mg Oral Capsule, Take 1 capsule by mouth 1 hour prior to CT scan. (Patient not taking: No sig reported)  lisinopriL (PRINIVIL) 20 mg Oral Tablet, Take 10 mg by mouth Once a day Pt state taking this medication  losartan (COZAAR) 25 mg Oral Tablet, Take 1 Tablet (25 mg total) by mouth Once a day    No facility-administered medications prior to visit.  Allergies:   Allergies   Allergen Reactions   . Sulfa (Sulfonamides) Rash   . Shellfish Containing Products Itching and Swelling     Shrimp - eye swelling, itching         OBJECTIVE  BP (!) 140/88 (Site: Left, Patient Position: Sitting, Cuff Size: Adult)   Pulse 72   Temp 36.3 C (97.4 F) (Thermal Scan)   Ht 1.753 m (5' 9" )   Wt 128 kg (283 lb 1.1 oz)   SpO2 98%   BMI 41.80 kg/m       General: no distress  HENT:  Atraumatic, normocephalic  Lungs: clear to auscultation bilaterally  Cardiovascular: RRR, no murmur, no tenderness to palpation of the chest wall  Abdomen: soft, non tender  Extremities: no cyanosis or edema  Skin: warm and dry, no rash  Neurologic: gait is normal, AOx3, CN 2-12 grossly intact  Psychiatric: normal affect and behavior    ECG on 03/31/2021 shows normal sinus rhythm without any significant ST-T  changes    ASSESSMENT/PLAN  (R07.89) Chest pressure  (primary encounter diagnosis), (K21.9) Gastroesophageal reflux disease, unspecified whether esophagitis present  Plan: ECG 12 Lead (TODAY, IN CLINIC) - by clinical         staff in department  -differential is broad but includes esophagitis, hiatal hernia, angina, GERD  -ECG showing no signs of previous or current ischemia  -increase Prilosec to 20 mg daily  -problem follow-up in 2 weeks, if continued concern will likely get upper endoscopy and a stress test  -in the meantime recommended light exercise, if he were to develop worsening chest pain during activity or worsening pain at all would encourage him to present to the emergency room for evaluation    (I10) Hypertension, unspecified type  Plan:  Chronic, not controlled   -increase losartan to 50 mg daily   -continue lopressor 50 mg b.i.d.  -return to clinic in 2 weeks for scheduled follow-up for blood pressure check    (G47.33,  Z99.89) OSA on CPAP  Plan:  Chronic, controlled   -continue CPAP      Orders Placed This Encounter   . ECG 12 Lead (TODAY, IN CLINIC) - by clinical staff in department   . losartan (COZAAR) 50 mg Oral Tablet         Return in about 2 weeks (around 04/14/2021).      Dola Argyle, MD 03/31/2021, 16:12

## 2021-03-31 NOTE — Telephone Encounter (Signed)
Received call from call center. Patient reporting chest pressure around sternum since Friday. States it started to come and go and now is constant. States he is currently at [employer].  Denies any new numbness or tingling, SOB, dizziness, or pain in arms. Does report increase in gas and burping. States it does relieve some pressure when he burps. Denies any recent medication/diet changes.     Patient has appointment with PCP this afternoon. Recommended patient hold off on working out until being seen. ER precautions given and patient verbalized understanding.     Dema Severin, RN

## 2021-04-01 NOTE — Telephone Encounter (Signed)
Increase in med dose from 25 mg to 50 mg  Henry [name], [place]

## 2021-04-14 ENCOUNTER — Other Ambulatory Visit: Payer: 59 | Attending: Student in an Organized Health Care Education/Training Program

## 2021-04-14 DIAGNOSIS — R7303 Prediabetes: Secondary | ICD-10-CM | POA: Insufficient documentation

## 2021-04-14 DIAGNOSIS — E785 Hyperlipidemia, unspecified: Secondary | ICD-10-CM | POA: Insufficient documentation

## 2021-04-14 DIAGNOSIS — Z862 Personal history of diseases of the blood and blood-forming organs and certain disorders involving the immune mechanism: Secondary | ICD-10-CM | POA: Insufficient documentation

## 2021-04-14 LAB — IRON TRANSFERRIN AND TIBC
IRON (TRANSFERRIN) SATURATION: 27 % (ref 15–50)
IRON: 94 ug/dL (ref 55–175)
TOTAL IRON BINDING CAPACITY: 351 ug/dL (ref 224–476)
TRANSFERRIN: 251 mg/dL (ref 160–340)

## 2021-04-14 LAB — CBC WITH DIFF
BASOPHIL #: <0.1 10*3/uL (ref <=0.20)
BASOPHIL %: 1 %
EOSINOPHIL #: 0.24 10*3/uL (ref <=0.50)
EOSINOPHIL %: 4 %
HCT: 45.5 % (ref 38.9–52.0)
HGB: 15.2 g/dL (ref 13.4–17.5)
IMMATURE GRANULOCYTE #: <0.1 10*3/uL (ref <0.10)
IMMATURE GRANULOCYTE %: 1 % (ref 0–1)
LYMPHOCYTE #: 2 10*3/uL (ref 1.00–4.80)
LYMPHOCYTE %: 33 %
MCH: 27.8 pg (ref 26.0–32.0)
MCHC: 33.4 g/dL (ref 31.0–35.5)
MCV: 83.2 fL (ref 78.0–100.0)
MONOCYTE #: 0.41 10*3/uL (ref 0.20–1.10)
MONOCYTE %: 7 %
MPV: 8.9 fL (ref 8.7–12.5)
NEUTROPHIL #: 3.35 10*3/uL (ref 1.50–7.70)
NEUTROPHIL %: 54 %
PLATELETS: 240 10*3/uL (ref 150–400)
RBC: 5.47 10*6/uL (ref 4.50–6.10)
RDW-CV: 13.2 % (ref 11.5–15.5)
WBC: 6.1 10*3/uL (ref 3.7–11.0)

## 2021-04-14 LAB — LIPID PANEL
CHOL/HDL RATIO: 3.8
CHOLESTEROL: 142 mg/dL (ref 100–200)
HDL CHOL: 37 mg/dL — ABNORMAL LOW (ref >=50)
LDL CALC: 67 mg/dL (ref <100)
NON-HDL: 105 mg/dL (ref <=190)
TRIGLYCERIDES: 230 mg/dL — ABNORMAL HIGH (ref <150)
VLDL CALC: 34 mg/dL — ABNORMAL HIGH (ref <30)

## 2021-04-15 ENCOUNTER — Other Ambulatory Visit: Payer: Self-pay

## 2021-04-15 ENCOUNTER — Encounter (HOSPITAL_BASED_OUTPATIENT_CLINIC_OR_DEPARTMENT_OTHER): Payer: Self-pay | Admitting: Student in an Organized Health Care Education/Training Program

## 2021-04-15 ENCOUNTER — Ambulatory Visit
Payer: 59 | Attending: Student in an Organized Health Care Education/Training Program | Admitting: Student in an Organized Health Care Education/Training Program

## 2021-04-15 VITALS — BP 168/88 | HR 71 | Temp 98.1°F | Ht 69.0 in | Wt 284.6 lb

## 2021-04-15 DIAGNOSIS — R7303 Prediabetes: Secondary | ICD-10-CM | POA: Insufficient documentation

## 2021-04-15 DIAGNOSIS — R1013 Epigastric pain: Secondary | ICD-10-CM | POA: Insufficient documentation

## 2021-04-15 DIAGNOSIS — Z9989 Dependence on other enabling machines and devices: Secondary | ICD-10-CM | POA: Insufficient documentation

## 2021-04-15 DIAGNOSIS — G4733 Obstructive sleep apnea (adult) (pediatric): Secondary | ICD-10-CM | POA: Insufficient documentation

## 2021-04-15 DIAGNOSIS — K219 Gastro-esophageal reflux disease without esophagitis: Secondary | ICD-10-CM | POA: Insufficient documentation

## 2021-04-15 DIAGNOSIS — I10 Essential (primary) hypertension: Secondary | ICD-10-CM | POA: Insufficient documentation

## 2021-04-15 LAB — HGA1C (HEMOGLOBIN A1C WITH EST AVG GLUCOSE)
ESTIMATED AVERAGE GLUCOSE: 126 mg/dL
HEMOGLOBIN A1C: 6 % — ABNORMAL HIGH (ref 4.0–5.6)

## 2021-04-15 NOTE — Progress Notes (Signed)
Department of Family Medicine   Progress Note    Henry [name]  MRN: [id number]  DOB: 30-Nov-1955  Date of Service: 04/15/2021    CHIEF COMPLAINT  Chief Complaint   Patient presents with   . Follow Up 6 Months       SUBJECTIVE  Henry [name] is a 66 y.o. male who presents to clinic for 6 month follow up. Patient states that his chest pain that caused to him to present to clinic two weeks ago has greatly improved following increase in Prilosec dosing. He says that the pain is still present at times following meals, but is he is overall satisfied with the continuing symptomatic improvement. Regarding his hypertension, he says that he has tolerated the increased dose of losartan well, with no notable adverse effects. He took his blood pressure at [facility] which read 138/80 after starting his new BP regimen. He still uses CPAP at night for OSA. He feels well rested today.    Patient continues to focus on eating a balanced diet. He has tried to minimize fat intake with aims of decrease triglycerides. He also limits salt intake.       Review of Systems:  Positive ROS discussed in HPI, otherwise all other systems negative.      Medications:   Acetaminophen 500 mg Oral Capsule, Take 1 Cap (500 mg total) by mouth Every 6 hours as needed  atorvastatin (LIPITOR) 40 mg Oral Tablet, Take 1 Tablet (40 mg total) by mouth Once a day  diclofenac sodium (VOLTAREN) 75 mg Oral Tablet, Delayed Release (E.C.), Take 1 Tablet (75 mg total) by mouth Twice daily  dicyclomine (BENTYL) 20 mg Oral Tablet, Take 1 Tablet (20 mg total) by mouth Four times a day  GLUCOS CHOND CPLX ADVANCED ORAL, Take 2 Tabs by mouth Once a day Glucosamine-Chondroitin 1500 mg  losartan (COZAAR) 50 mg Oral Tablet, Take 1 Tablet (50 mg total) by mouth Once a day  metoprolol tartrate (LOPRESSOR) 50 mg Oral Tablet, Take 1 Tablet (50 mg total) by mouth Twice daily  metroNIDAZOLE 1 % Gel with Pump, by Apply externally route Once a day  multivitamin Oral  Tablet, Take 1 Tablet by mouth Once a day  Non-Formulary/Special Preparation (MEDICATION HELP), CBD 2 capsules per day   Omeprazole 20 mg Oral Tablet, Delayed Release (E.C.), Take by mouth Monday Wednesday Friday  Ferrous Sulfate (SLOW RELEASE IRON) 142 mg (45 mg iron) Oral Tablet Sustained Release, Take 1 Tab (142 mg total) by mouth Three times a day as needed (Patient taking differently: Take 1 Tablet (142 mg total) by mouth Three times a day as needed Takes 3 times week)    No facility-administered medications prior to visit.      Allergies:   Allergies   Allergen Reactions   . Sulfa (Sulfonamides) Rash   . Shellfish Containing Products Itching and Swelling     Shrimp - eye swelling, itching         OBJECTIVE  BP (!) 168/88 (Site: Left, Patient Position: Sitting, Cuff Size: Adult)   Pulse 71   Temp 36.7 C (98.1 F) (Thermal Scan)   Ht 1.753 m (5' 9" )   Wt 129 kg (284 lb 9.8 oz)   SpO2 96%   BMI 42.03 kg/m       General: no distress  HENT: TMs clear, mouth mucous membranes moist, pharynx without injection or exudate   Lungs: clear to auscultation bilaterally  Cardiovascular: RRR, no murmur  Abdomen: soft, non tender,  bowel sounds present  Extremities: no cyanosis or edema  Skin: warm and dry, no rash  Neurologic: gait is normal, AOx3, CN 2-12 grossly intact  Psychiatric: normal affect and behavior    CBC with Diff (Last 48 Hours):    Recent Results in last 48 hours     04/14/21  0814   WBC 6.1   HGB 15.2   HCT 45.5   MCV 83.2   PLTCNT 240   PMNS 54   LYMPHO 33   MONOCYTES 7   EOSINO 4   BASOPHILS 1  <0.10     Lab Results   Component Value Date    TRIG 230 (H) 04/14/2021    HDLCHOL 37 (L) 04/14/2021    LDLCHOL 67 04/14/2021    CHOLESTEROL 142 04/14/2021         ASSESSMENT/PLAN  Henry [name] is a 66 y.o. male with PMH of HTN, HLD, prediabetes, OSA on CPAP, IBS, and osteoarthritis who presents to clinic for 6 month follow up.        ICD-10-CM    1. Primary hypertension  I10       2. Gastroesophageal  reflux disease, unspecified whether esophagitis present  K21.9       3. Dyspepsia  R10.13       4. Prediabetes  R73.03 HGA1C (HEMOGLOBIN A1C WITH EST AVG GLUCOSE)      5. OSA on CPAP  G47.33     Z99.89           Dyspepsia, GERD (improving)  - Acute, improving  - patient reports symptomatic improvement with Prilosec 20 mg PO daily  - Patient advised to increase Prilosec to 40 mg PO daily if symptoms plateau or worsen  - Iron labs normal, stop oral iron supplement  - No chest pressure experienced while exercising  - ECG normal at previous visit 2 weeks ago  - Continue to monitor given continued improvement    Hypertension  - Chronic, not controlled  - BP elevated to 168/88 and 150/84 on repeat read today  - Continue current treatment regimen of losartan 50 mg daily and lopressor 50 mg bid  - Return for BP check in 4 weeks  - If BP remains elevated, will consider increasing losartan to 100 mg daily    Osteoarthritis  - Chronic, controlled  - Continue Voltaren 75 mg PO    Hyperlipidemia, unspecified hyperlipidemia type  - chronic, stable  - LDL WNL on 04/14/21  - Triglycerides elevated. Diet improvements discussed with patient    IBS, unspecified type  - chronic, stable  - Continue dicyclomine 20 mg PO  - chronic, controlled    Obstructive Sleep Apnea  - chonic, stable  - Continue CPAP    Prediabetes  - Chronic, stable  - Awaiting A1c results      Orders Placed This Encounter   . HGA1C (HEMOGLOBIN A1C WITH EST AVG GLUCOSE)         Return in about 4 weeks (around 05/13/2021), or if symptoms worsen or fail to improve, for BP check.      Oliver Hum, MED STUDENT 04/15/2021, 11:25    I was present when the student was taking history, performing the exam, and during any medical decision making activities. I personally verified the history, performed the exam, and medical decision making as edited in the student's note. I agree with the medical student's note.     Dola Argyle, MD  04/15/2021, 11:27

## 2021-04-24 ENCOUNTER — Other Ambulatory Visit (HOSPITAL_BASED_OUTPATIENT_CLINIC_OR_DEPARTMENT_OTHER): Payer: Self-pay | Admitting: Student in an Organized Health Care Education/Training Program

## 2021-04-24 DIAGNOSIS — I10 Essential (primary) hypertension: Secondary | ICD-10-CM

## 2021-05-15 ENCOUNTER — Ambulatory Visit (HOSPITAL_BASED_OUTPATIENT_CLINIC_OR_DEPARTMENT_OTHER): Payer: Self-pay | Admitting: Student in an Organized Health Care Education/Training Program

## 2021-05-23 ENCOUNTER — Encounter (HOSPITAL_BASED_OUTPATIENT_CLINIC_OR_DEPARTMENT_OTHER): Payer: Self-pay

## 2021-06-05 ENCOUNTER — Ambulatory Visit (HOSPITAL_BASED_OUTPATIENT_CLINIC_OR_DEPARTMENT_OTHER): Payer: Self-pay | Admitting: Student in an Organized Health Care Education/Training Program

## 2021-06-05 ENCOUNTER — Other Ambulatory Visit: Payer: Self-pay

## 2021-06-05 ENCOUNTER — Encounter (HOSPITAL_BASED_OUTPATIENT_CLINIC_OR_DEPARTMENT_OTHER): Payer: Self-pay | Admitting: Student in an Organized Health Care Education/Training Program

## 2021-06-05 ENCOUNTER — Ambulatory Visit
Payer: 59 | Attending: Student in an Organized Health Care Education/Training Program | Admitting: Student in an Organized Health Care Education/Training Program

## 2021-06-05 VITALS — BP 138/90 | HR 79 | Temp 97.3°F | Wt 283.7 lb

## 2021-06-05 DIAGNOSIS — I10 Essential (primary) hypertension: Secondary | ICD-10-CM | POA: Insufficient documentation

## 2021-06-05 DIAGNOSIS — Z9989 Dependence on other enabling machines and devices: Secondary | ICD-10-CM | POA: Insufficient documentation

## 2021-06-05 DIAGNOSIS — G4733 Obstructive sleep apnea (adult) (pediatric): Secondary | ICD-10-CM | POA: Insufficient documentation

## 2021-06-05 DIAGNOSIS — K219 Gastro-esophageal reflux disease without esophagitis: Secondary | ICD-10-CM | POA: Insufficient documentation

## 2021-06-05 DIAGNOSIS — J3089 Other allergic rhinitis: Secondary | ICD-10-CM | POA: Insufficient documentation

## 2021-06-05 MED ORDER — LOSARTAN 100 MG TABLET
100.0000 mg | ORAL_TABLET | Freq: Every day | ORAL | 3 refills | Status: DC
Start: 2021-06-05 — End: 2022-06-03

## 2021-06-05 NOTE — Progress Notes (Signed)
Department of Family Medicine   Progress Note    Lauro Manlove  MRN: [id number]  DOB: 23-Aug-1955  Date of Service: 06/05/2021    CHIEF COMPLAINT  Chief Complaint   Patient presents with   . Blood Pressure Check       SUBJECTIVE  Henry [name] is a 66 y.o. male who presents to clinic for BP check.    HTN:  Has [facility] blood pressure [facility] since [facility] visit.  Both times blood pressure was mildly elevated.  Not having chest pain, shortness a breath, or dizziness.  He is taking his medications regularly.  Reports his weight is stable.  Has made some changes to his diet including decreasing sugar intake.    GERD:  Since last visit he did start taking Prilosec 20 mg daily.  Reports his reflux and chest discomfort symptoms have essentially resolved.    Seasonal allergies:  He reports over the last couple days his allergies including sinus drainage has worsened.  He started taking Allegra and reports this has been helpful.    Review of Systems:  Positive ROS discussed in HPI, otherwise all other systems negative.      Medications:   Acetaminophen 500 mg Oral Capsule, Take 1 Cap (500 mg total) by mouth Every 6 hours as needed  atorvastatin (LIPITOR) 40 mg Oral Tablet, Take 1 Tablet (40 mg total) by mouth Once a day  diclofenac sodium (VOLTAREN) 75 mg Oral Tablet, Delayed Release (E.C.), Take 1 Tablet (75 mg total) by mouth Twice daily  dicyclomine (BENTYL) 20 mg Oral Tablet, Take 1 Tablet (20 mg total) by mouth Four times a day  GLUCOS CHOND CPLX ADVANCED ORAL, Take 2 Tabs by mouth Once a day Glucosamine-Chondroitin 1500 mg  metoprolol tartrate (LOPRESSOR) 50 mg Oral Tablet, Take 1 Tablet (50 mg total) by mouth Twice daily  metroNIDAZOLE 1 % Gel with Pump, by Apply externally route Once a day  multivitamin Oral Tablet, Take 1 Tablet by mouth Once a day  Non-Formulary/Special Preparation (MEDICATION HELP), CBD 2 capsules per day   Omeprazole 20 mg Oral Tablet, Delayed Release (E.C.), Take by  mouth Monday Wednesday Friday  losartan (COZAAR) 50 mg Oral Tablet, Take 1 Tablet (50 mg total) by mouth Once a day    No facility-administered medications prior to visit.      Allergies:   Allergies   Allergen Reactions   . Sulfa (Sulfonamides) Rash   . Shellfish Containing Products Itching and Swelling     Shrimp - eye swelling, itching         OBJECTIVE  BP (!) 138/90 (Site: Left, Patient Position: Sitting, Cuff Size: Adult)   Pulse 79   Temp 36.3 C (97.3 F) (Thermal Scan)   Wt 129 kg (283 lb 11.7 oz)   SpO2 96%   BMI 41.90 kg/m       General: no distress  HENT:  Atraumatic, normocephalic  Lungs: clear to auscultation bilaterally  Cardiovascular: RRR, no murmur  Abdomen:  Nondistended  Extremities: no cyanosis or edema  Skin: warm and dry, no rash  Neurologic: gait is normal, AOx3, CN 2-12 grossly intact  Psychiatric: normal affect and behavior    ASSESSMENT/PLAN  (I10) Primary hypertension  (primary encounter diagnosis)  Plan: losartan (COZAAR) 100 mg Oral Tablet  -chronic, not controlled   -readings continue to be mildly elevated including on my repeat reading   -will continue Lopressor 50 mg b.i.d., although may be able to consider decreasing  or coming off of this in the future if were able to get his blood pressure is under control   -will increase losartan 100 mg daily    (J30.89) Environmental and seasonal allergies  Plan:  Chronic, stable   -may continue over-the-counter Allegra   -counseled on using local honey to help decrease severity    (K21.9) Gastroesophageal reflux disease, unspecified whether esophagitis present  Plan:  Chronic, controlled   -continue Prilosec 20 mg daily    (G47.33,  Z99.89) OSA on CPAP  Plan:  Chronic, controlled   -continue nightly CPAP    Orders Placed This Encounter   . losartan (COZAAR) 100 mg Oral Tablet         Return in about 6 weeks (around 07/17/2021), or if symptoms worsen or fail to improve.      Dola Argyle, MD 06/05/2021, 15:09

## 2021-06-10 ENCOUNTER — Ambulatory Visit: Payer: 59 | Attending: Orthopaedic Surgery | Admitting: Orthopaedic Surgery

## 2021-06-10 ENCOUNTER — Inpatient Hospital Stay (HOSPITAL_BASED_OUTPATIENT_CLINIC_OR_DEPARTMENT_OTHER)
Admission: RE | Admit: 2021-06-10 | Discharge: 2021-06-10 | Disposition: A | Discharge status: Home / Self Care | Payer: 59 | Source: Ambulatory Visit | Admitting: Radiology

## 2021-06-10 ENCOUNTER — Other Ambulatory Visit: Payer: Self-pay

## 2021-06-10 ENCOUNTER — Encounter (HOSPITAL_BASED_OUTPATIENT_CLINIC_OR_DEPARTMENT_OTHER): Payer: Self-pay | Admitting: Orthopaedic Surgery

## 2021-06-10 ENCOUNTER — Other Ambulatory Visit (HOSPITAL_BASED_OUTPATIENT_CLINIC_OR_DEPARTMENT_OTHER): Payer: 59 | Admitting: Radiology

## 2021-06-10 VITALS — BP 194/91 | HR 67 | Temp 97.5°F | Ht 69.29 in | Wt 281.5 lb

## 2021-06-10 DIAGNOSIS — M25552 Pain in left hip: Secondary | ICD-10-CM

## 2021-06-10 DIAGNOSIS — M545 Low back pain, unspecified: Secondary | ICD-10-CM

## 2021-06-10 DIAGNOSIS — M25562 Pain in left knee: Secondary | ICD-10-CM

## 2021-06-10 DIAGNOSIS — I8392 Asymptomatic varicose veins of left lower extremity: Secondary | ICD-10-CM

## 2021-06-10 DIAGNOSIS — M47817 Spondylosis without myelopathy or radiculopathy, lumbosacral region: Secondary | ICD-10-CM

## 2021-06-10 DIAGNOSIS — M5136 Other intervertebral disc degeneration, lumbar region: Secondary | ICD-10-CM

## 2021-06-10 DIAGNOSIS — M4316 Spondylolisthesis, lumbar region: Secondary | ICD-10-CM

## 2021-06-10 DIAGNOSIS — I82502 Chronic embolism and thrombosis of unspecified deep veins of left lower extremity: Secondary | ICD-10-CM | POA: Insufficient documentation

## 2021-06-10 DIAGNOSIS — I82592 Chronic embolism and thrombosis of other specified deep vein of left lower extremity: Secondary | ICD-10-CM

## 2021-06-10 DIAGNOSIS — Z96641 Presence of right artificial hip joint: Secondary | ICD-10-CM

## 2021-06-10 DIAGNOSIS — M1612 Unilateral primary osteoarthritis, left hip: Secondary | ICD-10-CM

## 2021-06-10 HISTORY — DX: Chronic embolism and thrombosis of unspecified deep veins of left lower extremity: I82.502

## 2021-06-10 NOTE — Progress Notes (Signed)
JOINT REPLACEMENT, [place] [facility]  [place] [address]  Operated by [place]     Name: Henry [name] MRN:  [id number]   Date: 06/10/2021 Age: 66 y.o.      Chief Complaint   Patient presents with   . Hip Pain       The patient returns today with left hip and low back pain.  he rates the pain a 6/10. He has a history of LBP with several injections that provide temporary relief. He has also done injections for the left hip, but is now interested in having L THA. His wife is retiring soon, and they look forward to traveling. He cannot walk long distances without increased pain in the left hip.     Review of Systems:  The patient denies recent change in health  The ROS is negative  The remainder of the review of systems is negative    Exam  BP (!) 194/91   Pulse 67   Temp 36.4 C (97.5 F)   Ht 1.76 m (5' 9.29")   Wt 128 kg (281 lb 8.4 oz)   BMI 41.23 kg/m   General:  well nourished, well developed  Gait:  Antalgic  HEENT:  Head: Normocephalic, no lesions, without obvious abnormality.  Respirations:  Normal/Unlabored  Smooth and painless ROM of the right hip  Stiff and irritable L hip with ROM      X-rays:  Today's images of the bilateral hip reveal well aligned, well fixed R THA, left hip shows severe DJD        Lumbar series reviewed to show spondylolisthesis at L4-L5        Impression:     ICD-10-CM    1. Primary osteoarthritis of left hip  M16.12       2. Left hip pain  M25.552 Left [facility] x-ray      3. Lumbar pain  M54.50 XR LUMBOSACRAL SPINE [IMG69]      4. Facet arthropathy, lumbosacral  M47.817       5. DDD (degenerative disc disease), lumbar  M51.36       6. Class 3 obesity (CMS HCC)  E66.01       7. 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 Spondylolisthesis of lumbar region  M43.16       9. Varicose veins of left lower extremity  I83.92       10. Anterior knee pain, left  M25.562       11. 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 male who has left hip DJD. He has tried NSAIDs, Cortisone injections and Weight loss with little relief. Nothing short of a left total Hip replacement will relieve pain and improve function. Today, the tentative plan to move forward with surgical scheduling. We will begin the scheduling process for left total Hip replacement and OMOP clearance is required. The patient was given a book on joint arthroplasty today. We will see him back in clinic for their pre operative visit. The patient was instructed to call with any new or concerning symptoms. All of the patient's questions were answered and he agreed to this plan.    BMI is 41 today, recommend 20 pound weight loss prior to R  THA.  Reminded him that his radicular pain will not change with THA.       he will follow up at the pre op [facility] & [facility]   Radiographs needed at next visit:  Updated hip series annually  he was encouraged to call or e-mail with questions or concerns      A shared visit was performed with the co-signing physician.  Domingo Sep, PA-C  06/10/2021, 09:11      We had a lengthy discussion   He does obtain dramatic but transient pain relief with intra-articular injections  That is the pain relief he can expect from a left total hip replacement  This is moderate arthritis and I do elicit pain with prom  Will proceed with postero-lateral left total hip replacement  He understands that the lumbar spondylo-listhesis may be contributing to some of that pain  Will proceed when medically fit  A little weight loss can help me a lot too even with the anterior knee pain   The patient was seen in tandem with the APP and these are my findings:  Primary osteoarthritis of left hip [M16.12]   I personally saw and examined the patient. I reviewed imaging. The majority of my encounter was spent in medical decision making. See physician's assistant  note for additional details.     Jacqualine Code, MD  [place], MD  [place] [id number]

## 2021-06-13 ENCOUNTER — Encounter (INDEPENDENT_AMBULATORY_CARE_PROVIDER_SITE_OTHER): Payer: Self-pay

## 2021-06-13 ENCOUNTER — Other Ambulatory Visit (INDEPENDENT_AMBULATORY_CARE_PROVIDER_SITE_OTHER): Payer: Self-pay | Admitting: Neurological Surgery

## 2021-06-13 DIAGNOSIS — M48061 Spinal stenosis, lumbar region without neurogenic claudication: Secondary | ICD-10-CM

## 2021-06-13 DIAGNOSIS — M1612 Unilateral primary osteoarthritis, left hip: Secondary | ICD-10-CM

## 2021-06-16 NOTE — H&P (Unsigned)
[facility]  Orthopaedic Medical Optimization Program  Consult       [name], [name], 66 y.o. male Surgeon: Radene Journey, MD   Date of Birth:  10/16/1955 Date of Service:  06/17/2021    Medical Record Number: [id number] Information Obtained from: patient and history reviewed via medical record     Chief Complaint: New Patient Visit    History of Present Illness:  Henry [name] is a 66 y.o. male that presents with need for pre-operative optimization.  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 He has had a R THA in the past.     Pt denies recent fever, chills, changes in bowel/bladder habits, dysuria, CP, palpitations, peripheral edema, dizziness. Pt denies known heart murmur or cardiac issues.  Pt denies syncopal or presyncopal symptoms as well as SOB, dyspnea, wheezing, orthopnea or PND. Pt states he is able to ambulate 4 blocks as well as a flight of stairs without experiencing SOB.     Weight loss goal of 20 lbs which is 261 lbs from his visit 06/10/2021. BMI goal of 40 would be 275 lbs. Will follow on our weight loss list.     OSA: CPAP use.     Hx LLE DVT: After a crush/sports injury when he was 66 years old. Provoked. No family or personal history of DVT otherwise.     History of prostate cancer: Around 2018. No chemo, completed radiation.     MEDICAL HISTORY     Past Medical History:   Diagnosis Date   . Cancer (CMS [facility]) 11/2015    prostate cancer   . Chronic pain    . CPAP (continuous positive airway pressure) dependence     settings unknown   . Deep vein thrombosis (DVT) (CMS [facility])     left leg   . GERD (gastroesophageal reflux disease)     well controlled on omeprazole   . Heartburn    . HTN    . Hyperlipidemia    . Hypertension    . Irritable bowel syndrome    . Melanoma (CMS [place])    . Neck problem    . Osteoarthritis of back    . Prostate cancer (CMS [place]) 12/11/2015   . Sleep apnea    . Wears glasses      Past Surgical  History:   Procedure Laterality Date   . HX ANKLE FRACTURE TX Right    . HX APPENDECTOMY     . 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 Caryl Comes   . PB COLONOSCOPY,DIAGNOSTIC        Allergies   Allergen Reactions   . Sulfa (Sulfonamides) Rash   . Shellfish Containing Products Itching and Swelling     Shrimp - eye swelling, itching     Outpatient Medications Marked as Taking for the 06/17/21 encounter (Office Visit) with Piedad Climes, MD   Medication Sig   . atorvastatin (LIPITOR) 40 mg Oral Tablet Take 1 Tablet (40 mg total) by mouth Once a day   . dicyclomine (BENTYL) 20 mg Oral Tablet Take 1 Tablet (20 mg total) by mouth Four times a day   . GLUCOS CHOND CPLX ADVANCED ORAL Take 2 Tabs by mouth Once a day Glucosamine-Chondroitin 1500 mg   . losartan (COZAAR) 100 mg Oral Tablet Take 1 Tablet (100 mg total) by mouth Once a day   . metoprolol tartrate (LOPRESSOR) 50 mg Oral Tablet Take  1 Tablet (50 mg total) by mouth Twice daily   . multivitamin Oral Tablet Take 1 Tablet by mouth Once a day   . Non-Formulary/Special Preparation (MEDICATION HELP) CBD 2 capsules per day    . Omeprazole 20 mg Oral Tablet, Delayed Release (E.C.) Take by mouth Once a day     Social History     Tobacco Use   . Smoking status: Former     Types: Cigars     Quit date: 2012     Years since quitting: 11.3   . Smokeless tobacco: Never   . Tobacco comments:     quit cigars in 2012   Vaping Use   . Vaping Use: Never used   Substance Use Topics   . Alcohol use: Yes     Alcohol/week: 2.0 standard drinks     Types: 2 Cans of beer per week     Comment: per week   . Drug use: No     Comment: capsule     Family Medical History:     Problem Relation (Age of Onset)    Asthma Father    COPD Father    Cancer Mother, Father    Coronary Artery Disease Mother    Diabetes Mother    High Cholesterol Mother    Hypertension (High Blood Pressure) Mother    Melanoma Brother    Migraines Mother              History of sleep apnea:      Yes     Dental issues that need  addressed prior to surgery:        No     Personal or family history of blood clots or bleeding disorders:     Yes     Personal of family history of complications from anesthesia:      No     History of pulmonary disorders:      No     History of MI/CVA/cardiac stents/cardiac surgeries:      No     Lower extremity cellulitis:      No     Last steroid injection(s):     NA     REVIEW OF SYSTEMS     Review of Systems   Constitutional: Negative for activity change, chills, nightsweats and fever.   HENT: Negative.    Respiratory: Negative for cough, chest tightness, shortness of breath and wheezing.    Cardiovascular: Negative for chest pain and palpitations.   Gastrointestinal: Negative.  Negative for abdominal distention, abdominal pain, constipation and diarrhea.   Genitourinary: Negative for difficulty urinating.   Musculoskeletal: Positive for arthralgias  Skin: Negative.    Neurological: Negative for dizziness and headaches.   Psychiatric/Behavioral: Negative for agitation, confusion and decreased concentration. The patient is not nervous/anxious.        EXAM     BP (!) 142/80   Pulse 64   Temp 36.1 C (97 F)   Ht 1.768 m (5' 9.61")   Wt 129 kg (284 lb 9.8 oz)   BMI 41.30 kg/m       Body mass index is 41.3 kg/m.  Physical Exam  Vitals reviewed.   Constitutional:       Appearance: Normal appearance. He is not ill-appearing.   HENT:      Head: Normocephalic and atraumatic.      Right Ear: External ear normal.      Left Ear: External ear normal.      Nose:  Nose normal. No rhinorrhea.      Mouth/Throat:      Mouth: Mucous membranes are moist.      Pharynx: Oropharynx is clear.   Eyes:      General:         Right eye: No discharge.         Left eye: No discharge.      Conjunctiva/sclera: Conjunctivae normal.      Pupils: Pupils are equal, round, and reactive to light.   Neck:      Vascular: No carotid bruit.   Cardiovascular:      Rate and Rhythm: Normal rate and regular rhythm.      Pulses: Normal pulses.       Heart sounds: Normal heart sounds. No murmur heard.  Pulmonary:      Effort: Pulmonary effort is normal. No respiratory distress.      Breath sounds: Normal breath sounds.   Abdominal:      General: Bowel sounds are normal. There is no distension.   Musculoskeletal:      Cervical back: Normal range of motion.      Right lower leg: No edema.      Left lower leg: No edema.      Comments: + varicose veins BL   Skin:     General: Skin is warm.      Capillary Refill: Capillary refill takes less than 2 seconds.      Coloration: Skin is not pale.   Neurological:      General: No focal deficit present.      Mental Status: He is alert and oriented to person, place, and time.   Psychiatric:         Mood and Affect: Mood normal.         Behavior: Behavior normal.         DIAGNOSTIC STUDIES     IMAGING:  I have personally reviewed the following reports:    Last CBC  (Last result in the past 2 years)      WBC   HGB   HCT   MCV   Platelets      04/14/21 0814 6.1   15.2   45.5   83.2   240             Last BMP  (Last result in the past 2 years)      Na   K   Cl   CO2   BUN   Cr   Calcium   Glucose   Glucose-Fasting        10/11/20 0907 140   4.2   103   22   17   0.87   9.6   117               Last BMP  (Last result in the past 2 years)      Na   K   Cl   CO2   BUN   Cr   Calcium   Glucose   Glucose-Fasting        10/11/20 0907 140   4.2   103   22   17   0.87   9.6   117           Last Hepatic Panel  (Last result in the past 2 years)      Albumin   Total PTN   Total Bili   Direct Bili   Ast/SGOT   Alt/SGPT  Alk Phos        10/11/20 0907 4.3   6.9   0.7  Comment: Naproxen therapy can falsely elevate total bilirubin levels.     25   40   85              XR hip series 05/2021      EKG: 03/2021 sinus rhythm, 1st degree AV block     Normal stress test 02/2008    Physician Notes/Consults: Dr. Caryl Comes 4/23      PRE-OPERATIVE ASSESSMENT     Patient endorses the following symptoms:  none    Revised Goldman Cardiac Risk Index:  Of the six  independent predictors of major cardiac complications, patient exhibits the following:  None           Total Points: 0    Rate of cardiac death, nonfatal myocardial infarction, and nonfatal cardiac arrest  No risk factors - 0.4 percent (95%CI 0.1-0.8 percent)  This represents the percentage risk of major cardiac complications (cardiac death, non-fatal MI, non-fatal cardiac arrest, post-operative cardiogenic pulmonary edema, and/or complete heart block).    ASA: ASA 3 - Patient with moderate systemic disease with functional limitations    METs: > 4 METs - Climbing a flight of stairs      ASSESSMENT/PLAN     Problem List as of 06/17/2021 Reviewed: 12/10/2020  1:37 PM by Ashok Cordia, MD    Osteoarthritis, knee    Primary osteoarthritis of left hip    Cervical disc disease    Chronic deep vein thrombosis (DVT) of left lower extremity (CMS [place])    Clinical trial participant    CPAP (continuous positive airway pressure) dependence    GERD (gastroesophageal reflux disease)    Hip pain    Hypercholesterolemia    Hyperlipemia    Hypertension    Irritable bowel syndrome    Left leg pain    Lower back pain    Normal colonoscopy 2008    OSA on CPAP    Paresthesia of upper and lower extremities of both sides    Prostate cancer (CMS [facility])    S/P right total hip arthroplasty    Sleep apnea    Varicose veins of left lower extremity     Osteoarthritis of the left hip  Pre operative exam  Extremity pain   - Patient has failed conservative therapy, operative management is planned  - Pre-operative labs ordered, pending. To be done 07/2021 when sees PCP.  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  - Pain management as per surgical team    - Antibiotic prophylaxis: Ancef  - VTE prophylaxis: Xarelto     Morbid obesity  - Weight goal ultimately 261 lbs  - BMI < 40 for scheduling, this is 275 lbs    HTN  - Hold losartan the  AM of surgery   - Continue metoprolol     HLD  - Continue Lipitor     Hx DVT  - Recommend DOAC for AC    Pre diabetes  - A1C 6.0, stable, continue weight loss efforts   - On no medications currently    Orders Placed This Encounter   . CBC/DIFF   . COMPREHENSIVE METABOLIC PANEL, NON-FASTING   . PT/INR        Piedad Climes, MD  Orthopaedic Medical Optimization Program  Department of Orthopaedics  [place]

## 2021-06-17 ENCOUNTER — Ambulatory Visit: Payer: 59 | Attending: Internal Medicine | Admitting: Internal Medicine

## 2021-06-17 ENCOUNTER — Other Ambulatory Visit: Payer: Self-pay

## 2021-06-17 VITALS — BP 142/80 | HR 64 | Temp 97.0°F | Ht 69.61 in | Wt 284.6 lb

## 2021-06-17 DIAGNOSIS — Z86718 Personal history of other venous thrombosis and embolism: Secondary | ICD-10-CM | POA: Insufficient documentation

## 2021-06-17 DIAGNOSIS — G4733 Obstructive sleep apnea (adult) (pediatric): Secondary | ICD-10-CM | POA: Insufficient documentation

## 2021-06-17 DIAGNOSIS — M1612 Unilateral primary osteoarthritis, left hip: Secondary | ICD-10-CM | POA: Insufficient documentation

## 2021-06-17 DIAGNOSIS — M79609 Pain in unspecified limb: Secondary | ICD-10-CM | POA: Insufficient documentation

## 2021-06-17 DIAGNOSIS — I10 Essential (primary) hypertension: Secondary | ICD-10-CM | POA: Insufficient documentation

## 2021-06-17 DIAGNOSIS — Z8546 Personal history of malignant neoplasm of prostate: Secondary | ICD-10-CM | POA: Insufficient documentation

## 2021-06-17 DIAGNOSIS — Z6841 Body Mass Index (BMI) 40.0 and over, adult: Secondary | ICD-10-CM | POA: Insufficient documentation

## 2021-06-17 DIAGNOSIS — Z01818 Encounter for other preprocedural examination: Secondary | ICD-10-CM | POA: Insufficient documentation

## 2021-06-17 DIAGNOSIS — E785 Hyperlipidemia, unspecified: Secondary | ICD-10-CM | POA: Insufficient documentation

## 2021-06-24 DIAGNOSIS — M4316 Spondylolisthesis, lumbar region: Secondary | ICD-10-CM

## 2021-06-24 DIAGNOSIS — M47816 Spondylosis without myelopathy or radiculopathy, lumbar region: Secondary | ICD-10-CM

## 2021-07-04 ENCOUNTER — Ambulatory Visit (INDEPENDENT_AMBULATORY_CARE_PROVIDER_SITE_OTHER): Payer: Self-pay | Admitting: Neurological Surgery

## 2021-07-08 ENCOUNTER — Other Ambulatory Visit: Payer: 59 | Attending: Internal Medicine

## 2021-07-08 DIAGNOSIS — M79609 Pain in unspecified limb: Secondary | ICD-10-CM | POA: Insufficient documentation

## 2021-07-08 DIAGNOSIS — M1612 Unilateral primary osteoarthritis, left hip: Secondary | ICD-10-CM | POA: Insufficient documentation

## 2021-07-08 DIAGNOSIS — Z01818 Encounter for other preprocedural examination: Secondary | ICD-10-CM | POA: Insufficient documentation

## 2021-07-08 LAB — COMPREHENSIVE METABOLIC PANEL, NON-FASTING
ALBUMIN: 4 g/dL (ref 3.4–4.8)
ALKALINE PHOSPHATASE: 87 U/L (ref 45–115)
ALT (SGPT): 54 U/L (ref 10–55)
ANION GAP: 7 mmol/L (ref 4–13)
AST (SGOT): 31 U/L (ref 8–45)
BILIRUBIN TOTAL: 0.5 mg/dL (ref 0.3–1.3)
BUN/CREA RATIO: 19 (ref 6–22)
BUN: 15 mg/dL (ref 8–25)
CALCIUM: 9.1 mg/dL (ref 8.8–10.2)
CHLORIDE: 106 mmol/L (ref 96–111)
CO2 TOTAL: 26 mmol/L (ref 23–31)
CREATININE: 0.81 mg/dL (ref 0.75–1.35)
ESTIMATED GFR: >90 mL/min/BSA (ref >=60)
GLUCOSE: 123 mg/dL (ref 65–125)
POTASSIUM: 4.2 mmol/L (ref 3.5–5.1)
PROTEIN TOTAL: 6.7 g/dL (ref 6.0–8.0)
SODIUM: 139 mmol/L (ref 136–145)

## 2021-07-08 LAB — CBC WITH DIFF
BASOPHIL #: <0.1 10*3/uL (ref <=0.20)
BASOPHIL %: 1 %
EOSINOPHIL #: 0.25 10*3/uL (ref <=0.50)
EOSINOPHIL %: 4 %
HCT: 44.3 % (ref 38.9–52.0)
HGB: 14.6 g/dL (ref 13.4–17.5)
IMMATURE GRANULOCYTE #: <0.1 10*3/uL (ref <0.10)
IMMATURE GRANULOCYTE %: 0 % (ref 0–1)
LYMPHOCYTE #: 1.75 10*3/uL (ref 1.00–4.80)
LYMPHOCYTE %: 28 %
MCH: 27.8 pg (ref 26.0–32.0)
MCHC: 33 g/dL (ref 31.0–35.5)
MCV: 84.4 fL (ref 78.0–100.0)
MONOCYTE #: 0.52 10*3/uL (ref 0.20–1.10)
MONOCYTE %: 8 %
MPV: 9.2 fL (ref 8.7–12.5)
NEUTROPHIL #: 3.67 10*3/uL (ref 1.50–7.70)
NEUTROPHIL %: 59 %
PLATELETS: 252 10*3/uL (ref 150–400)
RBC: 5.25 10*6/uL (ref 4.50–6.10)
RDW-CV: 13.1 % (ref 11.5–15.5)
WBC: 6.3 10*3/uL (ref 3.7–11.0)

## 2021-07-08 LAB — PT/INR
INR: 0.98 (ref 0.80–1.20)
PROTHROMBIN TIME: 11.4 seconds (ref 9.1–13.9)

## 2021-07-15 ENCOUNTER — Telehealth (HOSPITAL_BASED_OUTPATIENT_CLINIC_OR_DEPARTMENT_OTHER): Payer: Self-pay

## 2021-07-15 NOTE — Telephone Encounter (Signed)
Called to check on pt weight loss goal , pt is at 279lbs, goal is 275 lbs.  Will call pt next month

## 2021-07-22 ENCOUNTER — Encounter (INDEPENDENT_AMBULATORY_CARE_PROVIDER_SITE_OTHER): Payer: Self-pay | Admitting: Neurological Surgery

## 2021-07-22 ENCOUNTER — Ambulatory Visit: Payer: 59 | Attending: Neurological Surgery | Admitting: Neurological Surgery

## 2021-07-22 ENCOUNTER — Ambulatory Visit (HOSPITAL_BASED_OUTPATIENT_CLINIC_OR_DEPARTMENT_OTHER): Payer: Self-pay | Admitting: Student in an Organized Health Care Education/Training Program

## 2021-07-22 ENCOUNTER — Other Ambulatory Visit: Payer: Self-pay

## 2021-07-22 VITALS — Temp 97.3°F | Wt 282.0 lb

## 2021-07-22 DIAGNOSIS — M545 Low back pain, unspecified: Secondary | ICD-10-CM | POA: Insufficient documentation

## 2021-07-22 DIAGNOSIS — M48061 Spinal stenosis, lumbar region without neurogenic claudication: Secondary | ICD-10-CM | POA: Insufficient documentation

## 2021-07-22 DIAGNOSIS — M25559 Pain in unspecified hip: Secondary | ICD-10-CM | POA: Insufficient documentation

## 2021-07-22 NOTE — Procedures (Signed)
PAIN MANAGEMENT, [facility]  [place] [id number]  Operated by [facility]  Procedure Note    Name: Henry [name] MRN:  [id number]   Date: 07/22/2021 Age: 66 y.o.       [address] - INJECT DIAGN/THERAP SUBSTANCE W/ IMAGE GUIDANCE, INTERLAMINAR EPIDURAL/SUBARACHNOID, LUMBAR/SACRAL (AMB ONLY)  Performed by: Ashok Cordia, MD  Authorized by: Ashok Cordia, MD           Ashok Cordia, MD      Lumbar Epidural Steroid Injection with Fluoro    Pre Procedure Dx:  Lumbar stenosis    Post Procedure Dx:  Lumbar stenosis    Guidance:  Fluoroscopy    Level:  L4/5    Skin anesthetic:  775m 1% lido    Injectate:  232mdex and 75m86m25% bupi    Contrast:  4ml39m Consent and timeout were done.  Patient was placed in the prone position.  Skin was prepped and draped in the routine fashion and sterile technique was maintained throughout.    In an AP view the level listed above was identified.  Skin was anesthetized.  Next, a 20 gauge Touhey needle was advanced in the midline until trajectory was established.  Next, a lateral view was obtained.  Once the needle reached the posterior aspect of the spinous processes, loss of resistance technique was utilized.  Once the loss of resistance occurred, contrast was injected between AP and lateral views under live fluoroscopy.  Characteristic epidural spread was noted.  No vascular spread was noted.  After repeat negative aspiration the above solution was injected.  Patient tolerated this very well.  Patient returned to the recovery area with no complaints or apparent complications.    JonaAshok Cordia

## 2021-07-22 NOTE — Nursing Note (Signed)
Pain and Function:     [place] Pain Rating Scale     On a scale of 0-10, during the past 24 hours, pain has interfered with you usual activity: 7     On a scale of 0-10, during the past 24 hours, pain has interfered with your sleep: 5    On a scale of 0-10, during the past 24 hours, pain has affected your mood: 4     On a scale of 0-10, during the past 24 hours, pain has contributed to your stress: 5     On a scale of 0-10, what is your overall pain Rating: 7

## 2021-07-22 NOTE — Patient Instructions (Signed)
PA[facility] MANAGEMENT, [facility]  [address]  [place] [address]  Dept: [phone]  Dept Fax: [phone]  [phone]                                                 SPECIAL PROCEDURES                                     DISCHARGE FORM                                          [phone]      Please follow the instructions listed below for your procedures.  If you have questions concerning your procedure, you may call and leave a message.  Messages will be returned by the end of the next business day.  If you have an emergency, proceed to your local Emergency Department.      PROCEDURE: LUMBAR EPIDURAL STEROID INJECTION    Do not drive a car or operate machinery until tomorrow.  Rest today and return to normal activities tomorrow.  If you are on restricted activities by your physician, please continue to follow these.  If you are not sure, contact your physician.  It is possible to experience mild numbness of the lower back and legs.  This is temporary.  If you have soreness at the injection site, the application of heat or ice may be helpful. Mild analgesics may also be used.  In case of severe headache; lie flat to decrease it.  Increase all fluids especially those with caffeine.  Mild analgesics are also appropriate.  If headache is not relieved by these measures, contact the Pain Clinic.  Steroid injections may cause temporary increase of blood sugar levels.    These instructions have been reviewed with the patient and appropriate questions have answers.  Wandalee Ferdinand, LPN 03/23/1896 42:10

## 2021-07-25 ENCOUNTER — Encounter (HOSPITAL_BASED_OUTPATIENT_CLINIC_OR_DEPARTMENT_OTHER): Payer: Self-pay | Admitting: Student in an Organized Health Care Education/Training Program

## 2021-07-25 ENCOUNTER — Ambulatory Visit
Payer: 59 | Attending: Student in an Organized Health Care Education/Training Program | Admitting: Student in an Organized Health Care Education/Training Program

## 2021-07-25 ENCOUNTER — Other Ambulatory Visit: Payer: Self-pay

## 2021-07-25 VITALS — BP 158/88 | HR 65 | Temp 97.0°F | Ht 69.0 in | Wt 282.6 lb

## 2021-07-25 DIAGNOSIS — E7800 Pure hypercholesterolemia, unspecified: Secondary | ICD-10-CM | POA: Insufficient documentation

## 2021-07-25 DIAGNOSIS — G4733 Obstructive sleep apnea (adult) (pediatric): Secondary | ICD-10-CM | POA: Insufficient documentation

## 2021-07-25 DIAGNOSIS — Z9989 Dependence on other enabling machines and devices: Secondary | ICD-10-CM | POA: Insufficient documentation

## 2021-07-25 DIAGNOSIS — Z6841 Body Mass Index (BMI) 40.0 and over, adult: Secondary | ICD-10-CM | POA: Insufficient documentation

## 2021-07-25 DIAGNOSIS — I10 Essential (primary) hypertension: Secondary | ICD-10-CM | POA: Insufficient documentation

## 2021-07-25 MED ORDER — HYDROCHLOROTHIAZIDE 25 MG TABLET
25.0000 mg | ORAL_TABLET | Freq: Every day | ORAL | 4 refills | Status: DC
Start: 2021-07-25 — End: 2022-09-30

## 2021-07-25 NOTE — Progress Notes (Signed)
Department of Family Medicine   Progress Note    Ausencio Vaden  MRN: [id number]  DOB: 21-Jun-1955  Date of Service: 07/25/2021    CHIEF COMPLAINT  Chief Complaint   Patient presents with   . Hypertension       SUBJECTIVE  Henry [name] is a 66 y.o. male who presents to clinic for blood pressure follow-up.     Hypertension:  Denies any side effects of increased dose of losartan.  Has been checking his blood pressure regularly with readings in the 140-160s over 80s to 90s.  Denies any chest pain, shortness a breath, or dizziness.    Obesity:  Does continue struggle with weight.  Is set to get a left hip replacement but needs to lose around 20 lb.  Notes that he does eat healthy foods but feels portions are his issue.  Does work out regularly.  Is interested in medication if needed to help with weight loss.    Review of Systems:  Positive ROS discussed in HPI, otherwise all other systems negative.      Medications:   Acetaminophen 500 mg Oral Capsule, Take 1 Cap (500 mg total) by mouth Every 6 hours as needed  atorvastatin (LIPITOR) 40 mg Oral Tablet, Take 1 Tablet (40 mg total) by mouth Once a day  diclofenac sodium (VOLTAREN) 75 mg Oral Tablet, Delayed Release (E.C.), Take 1 Tablet (75 mg total) by mouth Twice daily  dicyclomine (BENTYL) 20 mg Oral Tablet, Take 1 Tablet (20 mg total) by mouth Four times a day  GLUCOS CHOND CPLX ADVANCED ORAL, Take 2 Tabs by mouth Once a day Glucosamine-Chondroitin 1500 mg  losartan (COZAAR) 100 mg Oral Tablet, Take 1 Tablet (100 mg total) by mouth Once a day  metoprolol tartrate (LOPRESSOR) 50 mg Oral Tablet, Take 1 Tablet (50 mg total) by mouth Twice daily  metroNIDAZOLE 1 % Gel with Pump, by Apply externally route Once a day  multivitamin Oral Tablet, Take 1 Tablet by mouth Once a day  Non-Formulary/Special Preparation (MEDICATION HELP), CBD 2 capsules per day   Omeprazole 20 mg Oral Tablet, Delayed Release (E.C.), Take by mouth Once a day    No facility-administered  medications prior to visit.      Allergies:   Allergies   Allergen Reactions   . Sulfa (Sulfonamides) Rash   . Shellfish Containing Products Itching and Swelling     Shrimp - eye swelling, itching         OBJECTIVE  BP (!) 158/88 (Site: Left, Patient Position: Sitting, Cuff Size: Adult Large)   Pulse 65   Temp 36.1 C (97 F) (Thermal Scan)   Ht 1.753 m (5' 9" )   Wt 128 kg (282 lb 10.1 oz)   SpO2 97%   BMI 41.74 kg/m       General: no distress  HENT:  Atraumatic, normocephalic  Lungs: clear to auscultation bilaterally  Cardiovascular: RRR, no murmur  Abdomen:  Nondistended  Extremities: no cyanosis or edema  Skin: warm and dry, no rash  Neurologic: gait is normal, AOx3, CN 2-12 grossly intact  Psychiatric: normal affect and behavior    ASSESSMENT/PLAN  (I10) Primary hypertension  (primary encounter diagnosis)  Plan: hydroCHLOROthiazide (HYDRODIURIL) 25 mg Oral         Tablet  -chronic, not controlled   -continue losartan 100 mg daily   -start hydrochlorothiazide 25 mg daily   -follow-up in 6 weeks for blood pressure check    (E66.01,  Z68.41) Class 3  severe obesity due to excess calories with serious comorbidity and body mass index (BMI) of 40.0 to 44.9 in adult (CMS [facility])  Plan:  Chronic, stable   -counseled on lifestyle changes to help with weight loss  -may consider Wegovy at follow-up visits pending insurance    (G47.33,  Z99.89) OSA on CPAP  Plan:  Chronic, controlled  -continue CPAP    (E78.00) Hypercholesterolemia  Plan:  Chronic, stable   -continue Lipitor 40 mg daily      Orders Placed This Encounter   . hydroCHLOROthiazide (HYDRODIURIL) 25 mg Oral Tablet         Return in about 6 weeks (around 09/05/2021), or if symptoms worsen or fail to improve.      Dola Argyle, MD 07/25/2021, 11:36

## 2021-08-07 ENCOUNTER — Ambulatory Visit (INDEPENDENT_AMBULATORY_CARE_PROVIDER_SITE_OTHER): Payer: Self-pay | Admitting: Neurological Surgery

## 2021-08-18 ENCOUNTER — Ambulatory Visit (HOSPITAL_BASED_OUTPATIENT_CLINIC_OR_DEPARTMENT_OTHER): Payer: Self-pay | Admitting: Student in an Organized Health Care Education/Training Program

## 2021-08-18 NOTE — Telephone Encounter (Signed)
Return call to patient.  Patient reporting 2 recent episodes of blood in urine.  History of prostate cancer.  Patient denies any other urinary symptoms such as burning, frequency, urgency.  Has appointment scheduled for 09-03-21.  Scheduled  Acute visit with PCP and reviewed ED precautions.     Barbaraann Rondo, RN

## 2021-08-20 ENCOUNTER — Telehealth (HOSPITAL_BASED_OUTPATIENT_CLINIC_OR_DEPARTMENT_OTHER): Payer: Self-pay

## 2021-08-20 NOTE — Telephone Encounter (Signed)
Pt has been cleared just checking on pt weight loss goal, pt is at 276lbs, goal is 275lbs. Will call pt next month

## 2021-08-21 ENCOUNTER — Encounter (HOSPITAL_COMMUNITY): Payer: Self-pay | Admitting: Radiation Oncology

## 2021-08-22 ENCOUNTER — Encounter (HOSPITAL_BASED_OUTPATIENT_CLINIC_OR_DEPARTMENT_OTHER): Payer: Self-pay | Admitting: Student in an Organized Health Care Education/Training Program

## 2021-08-22 ENCOUNTER — Other Ambulatory Visit: Payer: Self-pay

## 2021-08-22 ENCOUNTER — Ambulatory Visit
Payer: Medicare Other | Attending: Student in an Organized Health Care Education/Training Program | Admitting: Student in an Organized Health Care Education/Training Program

## 2021-08-22 ENCOUNTER — Other Ambulatory Visit (HOSPITAL_BASED_OUTPATIENT_CLINIC_OR_DEPARTMENT_OTHER): Payer: Medicare Other

## 2021-08-22 VITALS — BP 136/82 | HR 71 | Temp 97.7°F | Ht 69.0 in | Wt 277.8 lb

## 2021-08-22 DIAGNOSIS — Z8546 Personal history of malignant neoplasm of prostate: Secondary | ICD-10-CM | POA: Insufficient documentation

## 2021-08-22 DIAGNOSIS — Z6841 Body Mass Index (BMI) 40.0 and over, adult: Secondary | ICD-10-CM | POA: Insufficient documentation

## 2021-08-22 DIAGNOSIS — I10 Essential (primary) hypertension: Secondary | ICD-10-CM | POA: Insufficient documentation

## 2021-08-22 DIAGNOSIS — R319 Hematuria, unspecified: Secondary | ICD-10-CM | POA: Insufficient documentation

## 2021-08-22 LAB — URINALYSIS, MACROSCOPIC
BILIRUBIN: NEGATIVE mg/dL
BLOOD: NEGATIVE mg/dL
COLOR: NORMAL
GLUCOSE: NEGATIVE mg/dL
KETONES: NEGATIVE mg/dL
LEUKOCYTES: NEGATIVE WBCs/uL
NITRITE: NEGATIVE
PH: 6 (ref 5.0–8.0)
PROTEIN: NEGATIVE mg/dL
SPECIFIC GRAVITY: 1.016 (ref 1.005–1.030)
UROBILINOGEN: NEGATIVE mg/dL

## 2021-08-22 LAB — URINALYSIS, MICROSCOPIC
RBCS: 0 /hpf (ref <6.0)
WBCS: <1 /hpf (ref <4.0)

## 2021-08-22 LAB — PSA DIAGNOSTIC WITH FREE PSA REFLEX: PSA: 0.34 ng/mL (ref <=4.00)

## 2021-08-22 NOTE — Progress Notes (Signed)
Department of Family Medicine   Progress Note    Henry [name]  MRN: [id number]  DOB: May 12, 1955  Date of Service: 08/22/2021    CHIEF COMPLAINT  Chief Complaint   Patient presents with   . Blood in urine       SUBJECTIVE  Henry [name] is a 66 y.o. male who presents to clinic for hematuria.     He states that roughly 4 weeks ago he had an episode of being out a string like clot with a cream-colored piece at the head of it.  Reports he had a little bit pain with urination for the next couple days this seemingly resolved.  Notes that again had 1 episode hematuria over last weekend but subsequently resolved.  He denies any low back pain, pelvic pain, fevers, chills, or continued hematuria.  Does have history of prostate cancer in remission.  Last PSA was in August of 2022.    Has been checking his blood pressures regularly.  Reports they have generally been in the 130s to 140s over 80s to 90s.  No side effects from medication.  Denies any chest pain, shortness O breath, or dizziness.    Review of Systems:  Positive ROS discussed in HPI, otherwise all other systems negative.      Medications:   Acetaminophen 500 mg Oral Capsule, Take 1 Cap (500 mg total) by mouth Every 6 hours as needed  atorvastatin (LIPITOR) 40 mg Oral Tablet, Take 1 Tablet (40 mg total) by mouth Once a day  diclofenac sodium (VOLTAREN) 75 mg Oral Tablet, Delayed Release (E.C.), Take 1 Tablet (75 mg total) by mouth Twice daily  dicyclomine (BENTYL) 20 mg Oral Tablet, Take 1 Tablet (20 mg total) by mouth Four times a day  GLUCOS CHOND CPLX ADVANCED ORAL, Take 2 Tabs by mouth Once a day Glucosamine-Chondroitin 1500 mg  hydroCHLOROthiazide (HYDRODIURIL) 25 mg Oral Tablet, Take 1 Tablet (25 mg total) by mouth Once a day  losartan (COZAAR) 100 mg Oral Tablet, Take 1 Tablet (100 mg total) by mouth Once a day  metoprolol tartrate (LOPRESSOR) 50 mg Oral Tablet, Take 1 Tablet (50 mg total) by mouth Twice daily  metroNIDAZOLE 1 % Gel with Pump, by  Apply externally route Once a day  multivitamin Oral Tablet, Take 1 Tablet by mouth Once a day  Non-Formulary/Special Preparation (MEDICATION HELP), CBD 2 capsules per day   Omeprazole 20 mg Oral Tablet, Delayed Release (E.C.), Take by mouth Once a day  triamcinolone acetonide 0.1 % Cream, APPLY TWICE DAILY TO ECZEMA UP TO 2 WEEKS/MONTH AS NEEDED.    No facility-administered medications prior to visit.      Allergies:   Allergies   Allergen Reactions   . Sulfa (Sulfonamides) Rash   . Shellfish Containing Products Itching and Swelling     Shrimp - eye swelling, itching         OBJECTIVE  BP 136/82 (Site: Left, Patient Position: Sitting, Cuff Size: Adult Large)   Pulse 71   Temp 36.5 C (97.7 F) (Thermal Scan)   Ht 1.753 m (5' 9" )   Wt 126 kg (277 lb 12.5 oz)   SpO2 96%   BMI 41.02 kg/m       General: no distress  HENT:  Atraumatic, normocephalic  Lungs: clear to auscultation bilaterally  Cardiovascular: RRR, no murmur  Abdomen: soft, non tender, bowel sounds present, no CVA tenderness  Extremities: no cyanosis or edema  Skin: warm and dry, no rash  Neurologic: gait  is normal, AOx3, CN 2-12 grossly intact  Psychiatric: normal affect and behavior    ASSESSMENT/PLAN  (R31.9) Hematuria, unspecified type  (primary encounter diagnosis), (Z85.46) History of prostate cancer  Plan: URINALYSIS, MACROSCOPIC AND MICROSCOPIC         W/CULTURE REFLEX, PSA DIAGNOSTIC WITH FREE PSA         REFLEX  -acute   -differential is broad includes nephrolithiasis, UTI, bladder cancer, renal pathology  -will get formal urinalysis   -will get a PSA  -if hematuria and urine will plan to repeat in 4 weeks    (I10) Primary hypertension  Plan:  Chronic, controlled  -continue hydrochlorothiazide 25 mg daily, losartan 100 mg daily, and Lopressor 50 mg b.i.d.    (E66.01,  Z68.41) Class 3 severe obesity due to excess calories with serious comorbidity and body mass index (BMI) of 40.0 to 44.9 in adult (CMS [facility])  Plan:  Chronic,  improving  -gradually patient on recent weight loss  -continue current lifestyle changes to help with weight management      Orders Placed This Encounter   . URINALYSIS, MACROSCOPIC AND MICROSCOPIC W/CULTURE REFLEX   . URINALYSIS, MACROSCOPIC   . URINALYSIS, MICROSCOPIC   . PSA DIAGNOSTIC WITH FREE PSA REFLEX         Return in about 3 months (around 11/22/2021), or if symptoms worsen or fail to improve.        Dola Argyle, MD 08/22/2021, 17:18

## 2021-09-03 ENCOUNTER — Ambulatory Visit (HOSPITAL_BASED_OUTPATIENT_CLINIC_OR_DEPARTMENT_OTHER): Payer: Self-pay | Admitting: Student in an Organized Health Care Education/Training Program

## 2021-09-04 ENCOUNTER — Encounter (INDEPENDENT_AMBULATORY_CARE_PROVIDER_SITE_OTHER): Payer: Self-pay | Admitting: Neurological Surgery

## 2021-09-04 ENCOUNTER — Other Ambulatory Visit: Payer: Self-pay

## 2021-09-04 ENCOUNTER — Ambulatory Visit: Payer: Medicare Other | Attending: Neurological Surgery | Admitting: Neurological Surgery

## 2021-09-04 VITALS — BP 136/82 | HR 67 | Temp 97.0°F | Resp 16 | Ht 69.02 in | Wt 282.2 lb

## 2021-09-04 DIAGNOSIS — M48061 Spinal stenosis, lumbar region without neurogenic claudication: Secondary | ICD-10-CM

## 2021-09-04 DIAGNOSIS — M47812 Spondylosis without myelopathy or radiculopathy, cervical region: Secondary | ICD-10-CM

## 2021-09-04 NOTE — Progress Notes (Signed)
Pain Management    Chief Complaint:  Chief Complaint   Patient presents with   . Follow Up After Injection        History of Present Illness:  Henry [name] is a 66 y.o. male  50% reduction with LESI.  Very pleased.  No side effects.    Neck pain with no radiation.  Cracks and pops.  Worse with rotation.  No change with PT.    Past Medical History:  Past Medical History:   Diagnosis Date   . Cancer (CMS [facility]) 11/2015    prostate cancer   . Chronic pain    . CPAP (continuous positive airway pressure) dependence     settings unknown   . Deep vein thrombosis (DVT) (CMS [facility])     left leg   . GERD (gastroesophageal reflux disease)     well controlled on omeprazole   . Heartburn    . HTN    . Hyperlipidemia    . Hypertension    . Irritable bowel syndrome    . Melanoma (CMS [place])    . Neck problem    . Osteoarthritis of back    . Prostate cancer (CMS [place]) 12/11/2015   . Sleep apnea    . Wears glasses        Past Surgical History:  Past Surgical History:   Procedure Laterality Date   . HX ANKLE FRACTURE TX Right    . HX APPENDECTOMY     . 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 Caryl Comes   . PB COLONOSCOPY,DIAGNOSTIC             Social History:  Social History     Socioeconomic History   . Marital status: Married     Spouse name: Not on file   . Number of children: 0   . Years of education: Not on file   . Highest education level: Not on file   Occupational History     Employer: [employer]   . Occupation: retired     Comment: formerly worked administration in [place] Use   . Smoking status: Former     Types: Cigars     Quit date: 2012     Years since quitting: 11.5   . Smokeless tobacco: Never   . Tobacco comments:     quit cigars in 2012   Vaping Use   . Vaping Use: Never used   Substance and Sexual Activity   . Alcohol use: Yes     Alcohol/week: 2.0 standard drinks     Types: 2 Cans of beer per week     Comment: per week   . Drug use: No     Comment: capsule   . Sexual activity: Not on file   Other Topics  Concern   . Abuse/Domestic Violence No   . Caffeine Concern No   . Calcium intake adequate Yes   . Computer Use Yes   . Drives Yes   . Exercise Concern No   . Helmet Use Not Asked   . Seat Belt Yes   . Special Diet No   . Sunscreen used Yes   . Uses Cane Not Asked   . Uses walker Not Asked   . Uses wheelchair Not Asked   . Right hand dominant Yes   . Left hand dominant Not Asked   . Ambidextrous Not Asked   . Shift Work Not Asked   . Unusual  Sleep-Wake Schedule Not Asked   . Ability to Walk 1 Flight of Steps without SOB/CP Yes   . Routine Exercise Yes     Comment: [employer], 3-4 times a week, 1 hour,10 min    . Ability to Walk 2 Flight of Steps without SOB/CP Yes   . Unable to Ambulate Not Asked   . Total Care Not Asked   . Ability To Do Own ADL's Yes   . Uses Walker Not Asked   . Other Activity Level Yes     Comment: 14 steps at home. walks a lot. shoveled snow this winter. limited by pain.   Marland Kitchen Uses Cane Not Asked   Social History Narrative   . Not on file     Social Determinants of Health     Financial Resource Strain: Not on file   Transportation Needs: Not on file   Social Connections: Not on file   Intimate Partner Violence: Not on file   Housing Stability: Not on file       Family History:  Family Medical History:     Problem Relation (Age of Onset)    Asthma Father    COPD Father    Cancer Mother, Father    Coronary Artery Disease Mother    Diabetes Mother    High Cholesterol Mother    Hypertension (High Blood Pressure) Mother    Melanoma Brother    Migraines Mother            Medications:    Current Outpatient Medications:   .  Acetaminophen 500 mg Oral Capsule, Take 1 Cap (500 mg total) by mouth Every 6 hours as needed, Disp: , Rfl:   .  atorvastatin (LIPITOR) 40 mg Oral Tablet, Take 1 Tablet (40 mg total) by mouth Once a day, Disp: 90 Tablet, Rfl: 3  .  diclofenac sodium (VOLTAREN) 75 mg Oral Tablet, Delayed Release (E.C.), Take 1 Tablet (75 mg total) by mouth Twice daily, Disp: 180 Tablet, Rfl: 3  .   dicyclomine (BENTYL) 20 mg Oral Tablet, Take 1 Tablet (20 mg total) by mouth Four times a day, Disp: 480 Tablet, Rfl: 3  .  GLUCOS CHOND CPLX ADVANCED ORAL, Take 2 Tabs by mouth Once a day Glucosamine-Chondroitin 1500 mg, Disp: , Rfl:   .  hydroCHLOROthiazide (HYDRODIURIL) 25 mg Oral Tablet, Take 1 Tablet (25 mg total) by mouth Once a day, Disp: 90 Tablet, Rfl: 4  .  losartan (COZAAR) 100 mg Oral Tablet, Take 1 Tablet (100 mg total) by mouth Once a day, Disp: 90 Tablet, Rfl: 3  .  metoprolol tartrate (LOPRESSOR) 50 mg Oral Tablet, Take 1 Tablet (50 mg total) by mouth Twice daily, Disp: 180 Tablet, Rfl: 3  .  metroNIDAZOLE 1 % Gel with Pump, by Apply externally route Once a day, Disp: , Rfl:   .  multivitamin Oral Tablet, Take 1 Tablet by mouth Once a day, Disp: , Rfl:   .  Non-Formulary/Special Preparation (MEDICATION HELP), CBD 2 capsules per day , Disp: , Rfl:   .  Omeprazole 20 mg Oral Tablet, Delayed Release (E.C.), Take by mouth Once a day, Disp: , Rfl:   .  triamcinolone acetonide 0.1 % Cream, APPLY TWICE DAILY TO ECZEMA UP TO 2 WEEKS/MONTH AS NEEDED., Disp: , Rfl:     Review of Systems:  Denies constipation or sedation  All other systems negative except for any noted in the HPI    Physical Exam:  Vitals:    09/04/21  1546   BP: 136/82   Pulse: 67   Resp: 16   Temp: 36.1 C (97 F)   SpO2: 96%   Weight: 128 kg (282 lb 3 oz)   Height: 1.753 m (5' 9.02")   BMI: 41.74         Body mass index is 41.65 kg/m.  Appears stated age  Speech clear  Affect appropriate  Alert  No scleral icterus  Mmm  Adequate peripheral perfusion  Unlabored respirations  No abdominal distention  No edema  No rash  TTP cervical facets  5/5 BUE  Sensation intact BUE    Imaging:  Reviewed.    Outside Records:  Reviewed.    Assessment:  Cervical spondylosis  Lumbar stenosis  Back pain    Plan:  Repeat LESI prn  Discussed the pros and cons of cervical MBBs to RFA.  He'll read about it and call if he'd like to proceed.    Ashok Cordia,  MD

## 2021-09-04 NOTE — Nursing Note (Signed)
Procedure Follow Up  Henry [name] [facility]  [id number]  Chief Complaint   Patient presents with   . Follow Up After Injection        [place] Pain Rating Scale     On a scale of 0-10, during the past 24 hours, pain has interfered with you usual activity: 5     On a scale of 0-10, during the past 24 hours, pain has interfered with your sleep: 4    On a scale of 0-10, during the past 24 hours, pain has affected your mood: 2     On a scale of 0-10, during the past 24 hours, pain has contributed to your stress: 2     On a scale of 0-10, what is your overall pain Rating: 5      Vitals:    09/04/21 1546   BP: 136/82   Pulse: 67   Resp: 16   Temp: 36.1 C (97 F)   SpO2: 96%   Weight: 128 kg (282 lb 3 oz)   Height: 1.753 m (5' 9.02")   PainSc:   5     Body mass index is 41.65 kg/m.    New imaging:  1. N/A  Patient is here today S/ P LESI that was done on 07/22/21 and reports 50 % relief that lasted CURRENTLY.    Kerin Perna, Ambulatory Care Assistant  09/04/2021, 15:48

## 2021-09-15 ENCOUNTER — Observation Stay (HOSPITAL_COMMUNITY): Payer: Medicare Other | Admitting: Family Medicine

## 2021-09-15 ENCOUNTER — Other Ambulatory Visit: Payer: Self-pay

## 2021-09-15 ENCOUNTER — Encounter (HOSPITAL_COMMUNITY): Payer: Self-pay | Admitting: Student in an Organized Health Care Education/Training Program

## 2021-09-15 ENCOUNTER — Inpatient Hospital Stay
Admission: EM | Admit: 2021-09-15 | Discharge: 2021-09-17 | DRG: 445 | Disposition: A | Discharge status: Home / Self Care | Payer: Medicare Other | Attending: Family Medicine | Admitting: Family Medicine

## 2021-09-15 ENCOUNTER — Emergency Department (HOSPITAL_COMMUNITY): Payer: Medicare Other

## 2021-09-15 DIAGNOSIS — I10 Essential (primary) hypertension: Secondary | ICD-10-CM | POA: Diagnosis present

## 2021-09-15 DIAGNOSIS — Z6841 Body Mass Index (BMI) 40.0 and over, adult: Secondary | ICD-10-CM | POA: Insufficient documentation

## 2021-09-15 DIAGNOSIS — K589 Irritable bowel syndrome without diarrhea: Secondary | ICD-10-CM | POA: Diagnosis present

## 2021-09-15 DIAGNOSIS — K838 Other specified diseases of biliary tract: Secondary | ICD-10-CM

## 2021-09-15 DIAGNOSIS — F40240 Claustrophobia: Secondary | ICD-10-CM | POA: Diagnosis present

## 2021-09-15 DIAGNOSIS — E785 Hyperlipidemia, unspecified: Secondary | ICD-10-CM | POA: Diagnosis present

## 2021-09-15 DIAGNOSIS — Z86718 Personal history of other venous thrombosis and embolism: Secondary | ICD-10-CM

## 2021-09-15 DIAGNOSIS — K760 Fatty (change of) liver, not elsewhere classified: Secondary | ICD-10-CM | POA: Diagnosis present

## 2021-09-15 DIAGNOSIS — Z923 Personal history of irradiation: Secondary | ICD-10-CM

## 2021-09-15 DIAGNOSIS — Z87891 Personal history of nicotine dependence: Secondary | ICD-10-CM

## 2021-09-15 DIAGNOSIS — G4733 Obstructive sleep apnea (adult) (pediatric): Secondary | ICD-10-CM | POA: Diagnosis present

## 2021-09-15 DIAGNOSIS — K8309 Other cholangitis: Principal | ICD-10-CM | POA: Diagnosis present

## 2021-09-15 DIAGNOSIS — R109 Unspecified abdominal pain: Secondary | ICD-10-CM | POA: Diagnosis present

## 2021-09-15 DIAGNOSIS — N200 Calculus of kidney: Secondary | ICD-10-CM | POA: Diagnosis present

## 2021-09-15 DIAGNOSIS — Z8546 Personal history of malignant neoplasm of prostate: Secondary | ICD-10-CM

## 2021-09-15 DIAGNOSIS — M1990 Unspecified osteoarthritis, unspecified site: Secondary | ICD-10-CM | POA: Diagnosis present

## 2021-09-15 DIAGNOSIS — K219 Gastro-esophageal reflux disease without esophagitis: Secondary | ICD-10-CM | POA: Diagnosis present

## 2021-09-15 DIAGNOSIS — Z79899 Other long term (current) drug therapy: Secondary | ICD-10-CM

## 2021-09-15 LAB — CBC WITH DIFF
BASOPHIL #: <0.1 10*3/uL (ref <=0.20)
BASOPHIL %: 1 %
EOSINOPHIL #: 0.19 10*3/uL (ref <=0.50)
EOSINOPHIL %: 3 %
HCT: 42.7 % (ref 38.9–52.0)
HGB: 14.8 g/dL (ref 13.4–17.5)
IMMATURE GRANULOCYTE #: <0.1 10*3/uL (ref <0.10)
IMMATURE GRANULOCYTE %: 1 % (ref 0–1)
LYMPHOCYTE #: 1.74 10*3/uL (ref 1.00–4.80)
LYMPHOCYTE %: 23 %
MCH: 28.4 pg (ref 26.0–32.0)
MCHC: 34.7 g/dL (ref 31.0–35.5)
MCV: 82 fL (ref 78.0–100.0)
MONOCYTE #: 0.63 10*3/uL (ref 0.20–1.10)
MONOCYTE %: 8 %
MPV: 8.7 fL (ref 8.7–12.5)
NEUTROPHIL #: 5.1 10*3/uL (ref 1.50–7.70)
NEUTROPHIL %: 64 %
PLATELETS: 224 10*3/uL (ref 150–400)
RBC: 5.21 10*6/uL (ref 4.50–6.10)
RDW-CV: 13.1 % (ref 11.5–15.5)
WBC: 7.8 10*3/uL (ref 3.7–11.0)

## 2021-09-15 LAB — URINALYSIS, MICROSCOPIC
HYALINE CASTS: 3 /lpf (ref <4.0)
RBCS: 2 /hpf (ref <6.0)
WBCS: 1 /hpf (ref <4.0)

## 2021-09-15 LAB — BASIC METABOLIC PANEL
ANION GAP: 14 mmol/L — ABNORMAL HIGH (ref 4–13)
BUN/CREA RATIO: 18 (ref 6–22)
BUN: 19 mg/dL (ref 8–25)
CALCIUM: 8.8 mg/dL (ref 8.8–10.2)
CHLORIDE: 103 mmol/L (ref 96–111)
CO2 TOTAL: 22 mmol/L — ABNORMAL LOW (ref 23–31)
CREATININE: 1.04 mg/dL (ref 0.75–1.35)
ESTIMATED GFR: 79 mL/min/BSA (ref >=60)
GLUCOSE: 155 mg/dL — ABNORMAL HIGH (ref 65–125)
POTASSIUM: 3.8 mmol/L (ref 3.5–5.1)
SODIUM: 139 mmol/L (ref 136–145)

## 2021-09-15 LAB — HEPATIC FUNCTION PANEL
ALBUMIN: 3.9 g/dL (ref 3.4–4.8)
ALKALINE PHOSPHATASE: 106 U/L (ref 45–115)
ALT (SGPT): 170 U/L — ABNORMAL HIGH (ref 10–55)
AST (SGOT): 203 U/L — ABNORMAL HIGH (ref 8–45)
BILIRUBIN DIRECT: 0.3 mg/dL (ref 0.1–0.4)
BILIRUBIN TOTAL: 0.8 mg/dL (ref 0.3–1.3)
PROTEIN TOTAL: 7.1 g/dL (ref 6.0–8.0)

## 2021-09-15 LAB — URINALYSIS, MACROSCOPIC
BILIRUBIN: NEGATIVE mg/dL
BLOOD: NEGATIVE mg/dL
COLOR: NORMAL
GLUCOSE: NEGATIVE mg/dL
KETONES: NEGATIVE mg/dL
LEUKOCYTES: NEGATIVE WBCs/uL
NITRITE: NEGATIVE
PH: 5.5 (ref 5.0–8.0)
PROTEIN: NEGATIVE mg/dL
SPECIFIC GRAVITY: 1.018 (ref 1.005–1.030)
UROBILINOGEN: NEGATIVE mg/dL

## 2021-09-15 LAB — TROPONIN-I (FOR ED ONLY): TROPONIN I: 14 ng/L (ref <=30)

## 2021-09-15 LAB — ECG 12-LEAD
Atrial Rate: 67 {beats}/min
Calculated P Axis: 34 degrees
Calculated T Axis: 66 degrees
PR Interval: 208 ms
QRS Duration: 96 ms
QT Interval: 394 ms
Ventricular rate: 67 {beats}/min

## 2021-09-15 LAB — LIPASE: LIPASE: 19 U/L (ref 10–60)

## 2021-09-15 MED ORDER — FENTANYL (PF) 50 MCG/ML INJECTION SOLUTION
75.0000 ug | INTRAMUSCULAR | Status: AC
Start: 2021-09-16 — End: 2021-09-15
  Administered 2021-09-15: 75 ug via INTRAVENOUS
  Filled 2021-09-15: qty 2

## 2021-09-15 NOTE — ED Provider Notes (Signed)
[facility] - Emergency Department   Resident Provider Note    Name: Henry [name] [facility]  Age and Gender: 66 y.o. male  Date of Birth: 15-Jan-1956  MRN: [id number]  PCP: Dola Argyle, MD    Chief Complaint   Patient presents with    Abdominal Pain     Intermittent upper quadrant abdominal pain that started at [place]. Normal bowel and bladder habits. Denies nausea/vomiting and diarrhea.        HPI:  Arrival: The patient arrived by private car and is accompanied by wife  History Limitations: none    Henry [name] is a 66 y.o. male presenting with abdominal pain. Patient reports sudden onset generalized abdominal pain around 1700. He states pain initially improved after taking Pepto-Bismol, but shortly afterward returned. Reports severity 8/10. He states he did eat greasy food tonight and has not in a long time, otherwise no diet changes. History of appendectomy in the past, no other abdominal surgeries. Reports associated nausea. Denies alcohol use, recent illness, fever, vomiting, diarrhea, constipation, jaundice, chest pain, SOB. No additional complaints or concerns at this time.     Review of Systems:  Negative unless otherwise noted in HPI.    Below pertinent information reviewed with patient and/or EMR:  Medical History  Surgical History  Family History  Social History    Allergies   Allergen Reactions    Sulfa (Sulfonamides) Rash    Shellfish Containing Products Itching and Swelling     Shrimp - eye swelling, itching              Objective:  ED Triage Vitals [09/15/21 2055]   BP (Non-Invasive) (!) 196/99   Heart Rate 73   Respiratory Rate 15   Temperature 36.3 C (97.3 F)   SpO2 96 %   Weight 127 kg (280 lb 10.3 oz)   Height 1.753 m (5' 9" )     Nursing notes and vital signs reviewed.    Constitutional:  66 y.o. male who appears stated age in fair to good health and uncomfortable but in no distress.  HENT:   Head: Normocephalic and atraumatic.   Eyes: EOMI, normal conjunctiva.    Neck: Trachea midline. Neck supple.  Cardiovascular: Regular rate & rhythm. Intact distal pulses.  Pulmonary/Chest: Breath sounds clear & equal bilaterally. No respiratory distress.  Abdominal: BS +. Abdomen soft, mild generalized TTP mostly in RUQ, no rebound or guarding.  Musculoskeletal: No edema or deformity.  Skin: Warm and dry. No jaundice.   Psychiatric: Normal mood and affect. Behavior is normal.   Neurological: Patient alert and responsive, oriented x3. No focal deficits identified. Moving all extremities equally and fully.    Labs:   Labs Ordered/Reviewed   HEPATIC FUNCTION PANEL - Abnormal; Notable for the following components:       Result Value    ALT (SGPT) 170 (*)     AST (SGOT)  203 (*)     All other components within normal limits   BASIC METABOLIC PANEL - Abnormal; Notable for the following components:    CO2 TOTAL 22 (*)     ANION GAP 14 (*)     GLUCOSE 155 (*)     All other components within normal limits   LIPASE - Normal   TROPONIN-I (FOR ED ONLY) - Normal   URINALYSIS, MACROSCOPIC - Normal   URINALYSIS, MICROSCOPIC - Normal   URINALYSIS, MACROSCOPIC AND MICROSCOPIC W/CULTURE REFLEX    Narrative:     The  following orders were created for panel order URINALYSIS, MACROSCOPIC AND MICROSCOPIC W/CULTURE REFLEX.  Procedure                               Abnormality         Status                     ---------                               -----------         ------                     URINALYSIS, MACROSCOPIC[[id number]]      Normal              Final result               URINALYSIS, MICROSCOPIC[[id number]]      Normal              Final result                 Please view results for these tests on the individual orders.   CBC/DIFF    Narrative:     The following orders were created for panel order CBC/DIFF.  Procedure                               Abnormality         Status                     ---------                               -----------         ------                     CBC WITH  ERXV[[id number]]                                    Final result                 Please view results for these tests on the individual orders.   CBC WITH DIFF       Imaging:  CT ABDOMEN PELVIS W IV CONTRAST   Final Result by Edi, Radresults In (08/01 0141)   Common bile duct dilation without obstructing lesion appreciated. No intrahepatic bile duct dilation. No CT evidence of acute cholecystitis.               [place] RT UPPER QUADRANT   Final Result by Edi, Radresults In (08/01 0009)      1.Hepatomegaly and diffuse hepatic steatosis. Areas of decreased echogenicity adjacent to the gallbladder may represent focal fatty sparing.   2.Dilated common bile duct, not definitively appreciated on the prior. No evidence of intrahepatic bile duct dilation. Consider MRCP for further assessment.   3.Suspected nonobstructing 6 mm calculus in the upper pole the right kidney.          EKG: 12-Lead EKG interpreted by  me reveals sinus rhythm, rate of 67, normal axis, normal intervals, normal ST/T waves.  Any labs or imaging performed following the conclusion of my care for the patient, or performed following the patient leaving the ED, was not necessarily reviewed by myself or used for MDM.     MDM/Course:  Henry [name] is a 66 y.o. male who presented with sudden onset severe abdominal pain.  Relevant/pertinent previous medical records reviewed via chart review activity and/or CareEverywhere activity.    Medical Decision Making  Problems Addressed:  Abdominal pain, unspecified abdominal location: acute illness or injury    Amount and/or Complexity of Data Reviewed  Labs: ordered.     Details: ordered in triage and reviewed by me, as [email or web address] elevations in LFTs including ALT 170 and AST 203. normal bilirubin/HFP otherwise, normal lipase, normal troponin. all other labs unremarkable.  Radiology: ordered. Decision-making details documented in ED Course.  ECG/medicine tests: ordered and independent interpretation performed.      Details: interpretation as above, normal EKG  Discussion of management or test interpretation with external provider(s): Family medicine    Risk  Prescription drug management.  Parenteral controlled substances.  Decision regarding hospitalization.         Patient seen by and discussed with attending physician, Dr. Cresenciano Lick.     Patient presents with sudden onset generalized abdominal pain onset tonight shortly after eating greasy food, which he does not usually eat. Waxes and wanes in severity. Hx of appendectomy, does still have his gallbladder. This has not happened in the past. Fentanyl and Zofran given for symptom control. Labs ordered in triage and remarkable for mildly elevated liver enzymes. RUQ US reveals hepatomegaly, diffuse hepatic steatosis, dilated common bile duct, and nonobstructing stone in upper pole of right kidney.  Called to room as patient beginning to experience severe pain. Abdominal exam non-peritonitic. Morphine and robinul given which did not control pain. Pain dose ketamine ordered. CT abdomen pelvis ordered for further evaluation and remarkable for common bile duct dilation without obstruction identified. Given dilated common bile duct and severity of pain, believe patient would benefit from admission for further evaluation including MRCP/ERCP. Discussed case with family medicine who will see & admit patient.     Medications given:  Medications Administered in the ED   fentaNYL (SUBLIMAZE) 50 mcg/mL injection (75 mcg Intravenous Given 09/15/21 2350)   morphine 4 mg/mL injection (6 mg Intravenous Given 09/16/21 0108)   iopamidol (ISOVUE-370) 76% infusion (100 mL Intravenous Given 09/16/21 0121)   glycopyrrolate (ROBINUL) 0.2 mg/mL (200 mcg/mL) injection (200 mcg Intravenous Given 09/16/21 0204)   ondansetron (ZOFRAN) 2 mg/mL injection (4 mg Intravenous Given 09/16/21 0204)   ketamine 10 mg/mL injection - FOR PAIN (26 mg Intravenous Given(Other Clinician) 09/16/21 0203)       Clinical Impression:      Clinical Impression   Abdominal pain, unspecified abdominal location (Primary)   Common bile duct dilation       Patient will be admitted to the family medicine service for further workup and management.    Disposition: Admitted    Parts of this patients chart were completed in a retrospective fashion due to simultaneous direct patient care activities in the Emergency Department.   This note was partially generated using MModal Fluency Direct system, and there may be some incorrect words, spellings, and punctuation that were not noted in checking the note before saving.      Ernest Pine, MD 09/21/2021, 22:55   PGY-3 Emergency Medicine  [facility]

## 2021-09-15 NOTE — ED Attending Note (Signed)
Note begun by:  Lucienne Minks, MD 09/15/2021, 23:15    I was physically present and directly supervised this patient's care.  Patient seen and examined.  Resident / Aron Baba / NP history and exam reviewed.   Key elements in addition to and/or correction of that documentation are as follows:    HPI :    66 y.o. male presents with chief complaint of abd pain. He reports it started today. Ache. Not crampy. No associated nausea or diarrhea. Normal BM today. Started after he ate a ruben sandwich. He took [employer] with some relief but then it recurred. No CP or fevers or SOB.     PE :   VS on presentation: Blood pressure (!) 196/99, pulse 73, temperature 36.3 C (97.3 F), resp. rate 15, height 1.753 m (5' 9" ), weight 127 kg (280 lb 10.3 oz), SpO2 96 %.  He is alert, no distress. Heart RRR, lungs clear. Abd soft and there is no tenderness. Not jaundiced.   Data/Test :      Results for orders placed or performed during the hospital encounter of 09/15/21 (from the past 12 hour(s))   LIPASE   Result Value Ref Range    LIPASE 19 10 - 60 U/L   HEPATIC FUNCTION PANEL   Result Value Ref Range    ALBUMIN 3.9 3.4 - 4.8 g/dL     ALKALINE PHOSPHATASE 106 45 - 115 U/L    ALT (SGPT) 170 (H) 10 - 55 U/L    AST (SGOT)  203 (H) 8 - 45 U/L    BILIRUBIN TOTAL 0.8 0.3 - 1.3 mg/dL    BILIRUBIN DIRECT 0.3 0.1 - 0.4 mg/dL    PROTEIN TOTAL 7.1 6.0 - 8.0 g/dL   BASIC METABOLIC PANEL   Result Value Ref Range    SODIUM 139 136 - 145 mmol/L    POTASSIUM 3.8 3.5 - 5.1 mmol/L    CHLORIDE 103 96 - 111 mmol/L    CO2 TOTAL 22 (L) 23 - 31 mmol/L    ANION GAP 14 (H) 4 - 13 mmol/L    CALCIUM 8.8 8.8 - 10.2 mg/dL    GLUCOSE 155 (H) 65 - 125 mg/dL    BUN 19 8 - 25 mg/dL    CREATININE 1.04 0.75 - 1.35 mg/dL    BUN/CREA RATIO 18 6 - 22    ESTIMATED GFR 79 >=60 mL/min/BSA   TROPONIN-I (FOR ED ONLY)   Result Value Ref Range    TROPONIN I 14 <=30 ng/L   CBC WITH DIFF   Result Value Ref Range    WBC 7.8 3.7 - 11.0 x10^3/uL    RBC 5.21 4.50 - 6.10 x10^6/uL     HGB 14.8 13.4 - 17.5 g/dL    HCT 42.7 38.9 - 52.0 %    MCV 82.0 78.0 - 100.0 fL    MCH 28.4 26.0 - 32.0 pg    MCHC 34.7 31.0 - 35.5 g/dL    RDW-CV 13.1 11.5 - 15.5 %    PLATELETS 224 150 - 400 x10^3/uL    MPV 8.7 8.7 - 12.5 fL    NEUTROPHIL % 64 %    LYMPHOCYTE % 23 %    MONOCYTE % 8 %    EOSINOPHIL % 3 %    BASOPHIL % 1 %    NEUTROPHIL # 5.10 1.50 - 7.70 x10^3/uL    LYMPHOCYTE # 1.74 1.00 - 4.80 x10^3/uL    MONOCYTE # 0.63 0.20 - 1.10 x10^3/uL  EOSINOPHIL # 0.19 <=0.50 x10^3/uL    BASOPHIL # <0.10 <=0.20 x10^3/uL    IMMATURE GRANULOCYTE % 1 0 - 1 %    IMMATURE GRANULOCYTE # <0.10 <0.10 x10^3/uL   URINALYSIS, MACROSCOPIC   Result Value Ref Range    SPECIFIC GRAVITY 1.018 1.005 - 1.030    GLUCOSE Negative Negative mg/dL    PROTEIN Negative Negative mg/dL    BILIRUBIN Negative Negative mg/dL    UROBILINOGEN Negative Negative mg/dL    PH 5.5 5.0 - 8.0    BLOOD Negative Negative mg/dL    KETONES Negative Negative mg/dL    NITRITE Negative Negative    LEUKOCYTES Negative Negative WBCs/uL    APPEARANCE Clear Clear    COLOR Normal (Yellow) Normal (Yellow)   URINALYSIS, MICROSCOPIC   Result Value Ref Range    WBCS 1.0 <4.0 /hpf    RBCS 2.0 <6.0 /hpf    BACTERIA Occasional or less Occasional or less /hpf    HYALINE CASTS 3.0 <4.0 /lpf    SQUAMOUS EPITHELIAL CELLS Occasional or less Occasional or less /lpf    MUCOUS Light Light /lpf         Clinical Impression :   abdominal pain, biliary ductal dilation    MDM /ED course:  Concern for cholecystitis, appendicitis,  diverticulitis. LFT's slightly elevated. RUQ [place] ordered which showed duct dilation. His abd pain became worse so a CT was ordered and he was given additional pain medicine. CT also showed ductal dilation.        Plan :   He was admitted to [facility] for further work up.     CRITICAL CARE : None

## 2021-09-15 NOTE — ED Triage Notes (Signed)
[facility] - Emergency Department   Provider in Triage Note     Patient Name: Henry [name]  Patient MRN: [id number]  Date and Time of Assessment: 09/15/2021 20:57     Chief Complaint   Patient presents with    Abdominal Pain     Intermittent upper quadrant abdominal pain that started at [place]. Normal bowel and bladder habits. Denies nausea/vomiting and diarrhea.        Brief HPI: PT with worsening epigastric abdominal pain, onset this evening. Intermittent. Denies bowel/bladder issues. History of appendectomy.    Physical Exam:   BBS CTA  RRR, no murmurs, rubs or gallops  Non-toxic appearing  TTP to epigastric region    Preliminary Plan:  Labs ordered  EKG ordered  Patient will return to waiting room      Denna Haggard, APRN,AGACNP-BC

## 2021-09-16 ENCOUNTER — Emergency Department (EMERGENCY_DEPARTMENT_HOSPITAL): Payer: Medicare Other

## 2021-09-16 ENCOUNTER — Observation Stay (HOSPITAL_COMMUNITY): Payer: Medicare Other | Admitting: Certified Registered"

## 2021-09-16 ENCOUNTER — Encounter (HOSPITAL_COMMUNITY)
Admission: EM | Disposition: A | Discharge status: Home / Self Care | Payer: Self-pay | Source: Home / Self Care | Attending: Family Medicine

## 2021-09-16 ENCOUNTER — Observation Stay (HOSPITAL_COMMUNITY): Payer: Medicare Other | Admitting: Radiology

## 2021-09-16 ENCOUNTER — Inpatient Hospital Stay (HOSPITAL_COMMUNITY): Payer: Medicare Other

## 2021-09-16 DIAGNOSIS — R109 Unspecified abdominal pain: Secondary | ICD-10-CM | POA: Diagnosis present

## 2021-09-16 DIAGNOSIS — K760 Fatty (change of) liver, not elsewhere classified: Secondary | ICD-10-CM

## 2021-09-16 DIAGNOSIS — K838 Other specified diseases of biliary tract: Secondary | ICD-10-CM

## 2021-09-16 DIAGNOSIS — Z6841 Body Mass Index (BMI) 40.0 and over, adult: Secondary | ICD-10-CM

## 2021-09-16 DIAGNOSIS — R112 Nausea with vomiting, unspecified: Secondary | ICD-10-CM

## 2021-09-16 DIAGNOSIS — K8030 Calculus of bile duct with cholangitis, unspecified, without obstruction: Secondary | ICD-10-CM

## 2021-09-16 DIAGNOSIS — I10 Essential (primary) hypertension: Secondary | ICD-10-CM

## 2021-09-16 DIAGNOSIS — R160 Hepatomegaly, not elsewhere classified: Secondary | ICD-10-CM

## 2021-09-16 DIAGNOSIS — E785 Hyperlipidemia, unspecified: Secondary | ICD-10-CM

## 2021-09-16 DIAGNOSIS — N200 Calculus of kidney: Secondary | ICD-10-CM

## 2021-09-16 DIAGNOSIS — I499 Cardiac arrhythmia, unspecified: Secondary | ICD-10-CM

## 2021-09-16 DIAGNOSIS — R933 Abnormal findings on diagnostic imaging of other parts of digestive tract: Secondary | ICD-10-CM

## 2021-09-16 DIAGNOSIS — R748 Abnormal levels of other serum enzymes: Secondary | ICD-10-CM

## 2021-09-16 DIAGNOSIS — K589 Irritable bowel syndrome without diarrhea: Secondary | ICD-10-CM

## 2021-09-16 DIAGNOSIS — K219 Gastro-esophageal reflux disease without esophagitis: Secondary | ICD-10-CM

## 2021-09-16 HISTORY — DX: Body Mass Index (BMI) 40.0 and over, adult: Z684

## 2021-09-16 HISTORY — DX: Morbid (severe) obesity due to excess calories: E66.01

## 2021-09-16 LAB — CBC WITH DIFF
BASOPHIL #: <0.1 10*3/uL (ref <=0.20)
BASOPHIL %: 0 %
EOSINOPHIL #: <0.1 10*3/uL (ref <=0.50)
EOSINOPHIL %: 0 %
HCT: 37.8 % — ABNORMAL LOW (ref 38.9–52.0)
HGB: 13.2 g/dL — ABNORMAL LOW (ref 13.4–17.5)
IMMATURE GRANULOCYTE #: <0.1 10*3/uL (ref <0.10)
IMMATURE GRANULOCYTE %: 0 % (ref 0–1)
LYMPHOCYTE #: 0.38 10*3/uL — ABNORMAL LOW (ref 1.00–4.80)
LYMPHOCYTE %: 6 %
MCH: 28.4 pg (ref 26.0–32.0)
MCHC: 34.9 g/dL (ref 31.0–35.5)
MCV: 81.5 fL (ref 78.0–100.0)
MONOCYTE #: 0.12 10*3/uL — ABNORMAL LOW (ref 0.20–1.10)
MONOCYTE %: 2 %
MPV: 9 fL (ref 8.7–12.5)
NEUTROPHIL #: 5.78 10*3/uL (ref 1.50–7.70)
NEUTROPHIL %: 92 %
PLATELETS: 182 10*3/uL (ref 150–400)
RBC: 4.64 10*6/uL (ref 4.50–6.10)
RDW-CV: 13.2 % (ref 11.5–15.5)
WBC: 6.3 10*3/uL (ref 3.7–11.0)

## 2021-09-16 LAB — BASIC METABOLIC PANEL
ANION GAP: 10 mmol/L (ref 4–13)
BUN/CREA RATIO: 19 (ref 6–22)
BUN: 16 mg/dL (ref 8–25)
CALCIUM: 7.3 mg/dL — ABNORMAL LOW (ref 8.8–10.2)
CHLORIDE: 106 mmol/L (ref 96–111)
CO2 TOTAL: 20 mmol/L — ABNORMAL LOW (ref 23–31)
CREATININE: 0.85 mg/dL (ref 0.75–1.35)
ESTIMATED GFR: >90 mL/min/BSA (ref >=60)
GLUCOSE: 139 mg/dL — ABNORMAL HIGH (ref 65–125)
POTASSIUM: 4 mmol/L (ref 3.5–5.1)
SODIUM: 136 mmol/L (ref 136–145)

## 2021-09-16 LAB — PHOSPHORUS: PHOSPHORUS: 1.6 mg/dL — ABNORMAL LOW (ref 2.3–4.0)

## 2021-09-16 LAB — HEPATIC FUNCTION PANEL
ALBUMIN: 3.4 g/dL (ref 3.4–4.8)
ALKALINE PHOSPHATASE: 103 U/L (ref 45–115)
ALT (SGPT): 516 U/L — ABNORMAL HIGH (ref 10–55)
AST (SGOT): 586 U/L — ABNORMAL HIGH (ref 8–45)
BILIRUBIN DIRECT: 0.8 mg/dL — ABNORMAL HIGH (ref 0.1–0.4)
BILIRUBIN TOTAL: 2.1 mg/dL — ABNORMAL HIGH (ref 0.3–1.3)
PROTEIN TOTAL: 6.3 g/dL (ref 6.0–8.0)

## 2021-09-16 LAB — ECG 12-LEAD
Calculated R Axis: 47 degrees
QTC Calculation: 416 ms

## 2021-09-16 LAB — TROPONIN-I: TROPONIN I: 9 ng/L (ref 0–30)

## 2021-09-16 LAB — MAGNESIUM: MAGNESIUM: 1.4 mg/dL — ABNORMAL LOW (ref 1.8–2.6)

## 2021-09-16 SURGERY — ERCP
Anesthesia: General | Site: Mouth | Wound class: Clean Contaminated Wounds-The respiratory, GI, Genital, or urinary

## 2021-09-16 MED ORDER — IOPAMIDOL 370 MG IODINE/ML (76 %) INTRAVENOUS SOLUTION
100.0000 mL | INTRAVENOUS | Status: AC
Start: 2021-09-16 — End: 2021-09-16
  Administered 2021-09-16: 100 mL via INTRAVENOUS

## 2021-09-16 MED ORDER — LACTATED RINGERS INTRAVENOUS SOLUTION
INTRAVENOUS | Status: DC
Start: 2021-09-16 — End: 2021-09-16

## 2021-09-16 MED ORDER — ONDANSETRON HCL (PF) 4 MG/2 ML INJECTION SOLUTION
4.0000 mg | Freq: Three times a day (TID) | INTRAMUSCULAR | Status: DC | PRN
Start: 2021-09-16 — End: 2021-09-17

## 2021-09-16 MED ORDER — SODIUM CHLORIDE 0.9 % (FLUSH) INJECTION SYRINGE
2.0000 mL | INJECTION | INTRAMUSCULAR | Status: DC | PRN
Start: 2021-09-16 — End: 2021-09-17

## 2021-09-16 MED ORDER — KETAMINE 10 MG/ML INJECTION WRAPPER
26.0000 mg | Freq: Once | INTRAMUSCULAR | Status: AC
Start: 2021-09-16 — End: 2021-09-16
  Administered 2021-09-16: 26 mg via INTRAVENOUS
  Filled 2021-09-16: qty 5

## 2021-09-16 MED ORDER — ONDANSETRON HCL (PF) 4 MG/2 ML INJECTION SOLUTION
4.0000 mg | INTRAMUSCULAR | Status: AC
Start: 2021-09-16 — End: 2021-09-16
  Administered 2021-09-16: 4 mg via INTRAVENOUS
  Filled 2021-09-16: qty 2

## 2021-09-16 MED ORDER — PROPOFOL 10 MG/ML INTRAVENOUS EMULSION
INTRAVENOUS | Status: AC
Start: 2021-09-16 — End: 2021-09-16
  Filled 2021-09-16: qty 20

## 2021-09-16 MED ORDER — SODIUM CHLORIDE 0.9 % (FLUSH) INJECTION SYRINGE
2.0000 mL | INJECTION | Freq: Three times a day (TID) | INTRAMUSCULAR | Status: DC
Start: 2021-09-16 — End: 2021-09-17
  Administered 2021-09-16: 0 mL
  Administered 2021-09-16: 2 mL
  Administered 2021-09-16 – 2021-09-17 (×3): 0 mL

## 2021-09-16 MED ORDER — SODIUM CHLORIDE 0.9 % INTRAVENOUS PIGGYBACK
4.5000 g | INTRAVENOUS | Status: AC
  Administered 2021-09-16: 0 g via INTRAVENOUS
  Administered 2021-09-16: 4.5 g via INTRAVENOUS
  Filled 2021-09-16: qty 20

## 2021-09-16 MED ORDER — FENTANYL (PF) 50 MCG/ML INJECTION SOLUTION
Freq: Once | INTRAMUSCULAR | Status: DC | PRN
Start: 2021-09-16 — End: 2021-09-16
  Administered 2021-09-16: 100 ug via INTRAVENOUS

## 2021-09-16 MED ORDER — DEXAMETHASONE SODIUM PHOSPHATE 4 MG/ML INJECTION SOLUTION
INTRAMUSCULAR | Status: AC
Start: 2021-09-16 — End: 2021-09-16
  Filled 2021-09-16: qty 1

## 2021-09-16 MED ORDER — FENTANYL (PF) 50 MCG/ML INJECTION SOLUTION
25.0000 ug | INTRAMUSCULAR | Status: DC | PRN
Start: 2021-09-16 — End: 2021-09-16

## 2021-09-16 MED ORDER — LACTATED RINGERS INTRAVENOUS SOLUTION
INTRAVENOUS | Status: DC | PRN
Start: 2021-09-16 — End: 2021-09-16
  Administered 2021-09-16: 0 via INTRAVENOUS

## 2021-09-16 MED ORDER — PROCHLORPERAZINE EDISYLATE 10 MG/2 ML (5 MG/ML) INJECTION SOLUTION
10.0000 mg | INTRAMUSCULAR | Status: AC
Start: 2021-09-16 — End: 2021-09-16
  Administered 2021-09-16: 10 mg via INTRAVENOUS
  Filled 2021-09-16: qty 2

## 2021-09-16 MED ORDER — DEXTROSE 5% IN WATER (D5W) FLUSH BAG - 250 ML
INTRAVENOUS | Status: DC | PRN
Start: 2021-09-16 — End: 2021-09-17

## 2021-09-16 MED ORDER — LIDOCAINE (PF) 20 MG/ML (2 %) INJECTION SOLUTION
INTRAMUSCULAR | Status: AC
Start: 2021-09-16 — End: 2021-09-16
  Filled 2021-09-16: qty 5

## 2021-09-16 MED ORDER — SODIUM CHLORIDE 0.9 % INTRAVENOUS PIGGYBACK
4.5000 g | Freq: Three times a day (TID) | INTRAVENOUS | Status: DC
  Administered 2021-09-16: 4.5 g via INTRAVENOUS
  Administered 2021-09-16: 0 g via INTRAVENOUS
  Administered 2021-09-16: 4.5 g via INTRAVENOUS
  Administered 2021-09-17: 0 g via INTRAVENOUS
  Administered 2021-09-17: 4.5 g via INTRAVENOUS
  Administered 2021-09-17: 0 g via INTRAVENOUS
  Filled 2021-09-16 (×3): qty 20

## 2021-09-16 MED ORDER — PROCHLORPERAZINE EDISYLATE 10 MG/2 ML (5 MG/ML) INJECTION SOLUTION
5.0000 mg | Freq: Once | INTRAMUSCULAR | Status: DC | PRN
Start: 2021-09-16 — End: 2021-09-16

## 2021-09-16 MED ORDER — DIAZEPAM 5 MG/ML INJECTION SYRINGE
INJECTION | INTRAMUSCULAR | Status: AC
Start: 2021-09-16 — End: 2021-09-16
  Filled 2021-09-16: qty 2

## 2021-09-16 MED ORDER — SODIUM CHLORIDE 0.9 % (FLUSH) INJECTION SYRINGE
2.0000 mL | INJECTION | Freq: Three times a day (TID) | INTRAMUSCULAR | Status: DC
Start: 2021-09-16 — End: 2021-09-17
  Administered 2021-09-16 – 2021-09-17 (×4): 0 mL

## 2021-09-16 MED ORDER — GLYCOPYRROLATE 0.2 MG/ML INJECTION SOLUTION
200.0000 ug | INTRAMUSCULAR | Status: AC
Start: 2021-09-16 — End: 2021-09-16
  Administered 2021-09-16: 200 ug via INTRAVENOUS
  Filled 2021-09-16: qty 1

## 2021-09-16 MED ORDER — ONDANSETRON HCL (PF) 4 MG/2 ML INJECTION SOLUTION
Freq: Once | INTRAMUSCULAR | Status: DC | PRN
Start: 2021-09-16 — End: 2021-09-16
  Administered 2021-09-16: 4 mg via INTRAVENOUS

## 2021-09-16 MED ORDER — ONDANSETRON HCL (PF) 4 MG/2 ML INJECTION SOLUTION
INTRAMUSCULAR | Status: AC
Start: 2021-09-16 — End: 2021-09-16
  Filled 2021-09-16: qty 2

## 2021-09-16 MED ORDER — LIDOCAINE (PF) 100 MG/5 ML (2 %) INTRAVENOUS SYRINGE
INJECTION | Freq: Once | INTRAVENOUS | Status: DC | PRN
Start: 2021-09-16 — End: 2021-09-16
  Administered 2021-09-16: 100 mg via INTRAVENOUS

## 2021-09-16 MED ORDER — HYDROCHLOROTHIAZIDE 25 MG TABLET
25.0000 mg | ORAL_TABLET | Freq: Every day | ORAL | Status: DC
Start: 2021-09-16 — End: 2021-09-17
  Administered 2021-09-16 – 2021-09-17 (×2): 25 mg via ORAL
  Filled 2021-09-16 (×2): qty 1

## 2021-09-16 MED ORDER — DEXAMETHASONE SODIUM PHOSPHATE 4 MG/ML INJECTION SOLUTION
Freq: Once | INTRAMUSCULAR | Status: DC | PRN
Start: 2021-09-16 — End: 2021-09-16
  Administered 2021-09-16: 4 mg via INTRAVENOUS

## 2021-09-16 MED ORDER — PANTOPRAZOLE 40 MG TABLET,DELAYED RELEASE
40.0000 mg | DELAYED_RELEASE_TABLET | Freq: Every day | ORAL | Status: DC
Start: 2021-09-16 — End: 2021-09-17
  Administered 2021-09-16 – 2021-09-17 (×2): 40 mg via ORAL
  Filled 2021-09-16 (×2): qty 1

## 2021-09-16 MED ORDER — PROPOFOL 10 MG/ML IV BOLUS
INJECTION | Freq: Once | INTRAVENOUS | Status: DC | PRN
Start: 2021-09-16 — End: 2021-09-16
  Administered 2021-09-16: 200 mg via INTRAVENOUS

## 2021-09-16 MED ORDER — METOPROLOL TARTRATE 50 MG TABLET
50.0000 mg | ORAL_TABLET | Freq: Two times a day (BID) | ORAL | Status: DC
Start: 2021-09-16 — End: 2021-09-17
  Administered 2021-09-16 – 2021-09-17 (×3): 50 mg via ORAL
  Filled 2021-09-16 (×3): qty 1

## 2021-09-16 MED ORDER — DEXAMETHASONE SODIUM PHOSPHATE (PF) 10 MG/ML INJECTION SOLUTION
4.0000 mg | Freq: Once | INTRAMUSCULAR | Status: DC | PRN
Start: 2021-09-16 — End: 2021-09-16

## 2021-09-16 MED ORDER — SODIUM CHLORIDE 0.9% FLUSH BAG - 250 ML
INTRAVENOUS | Status: DC | PRN
Start: 2021-09-16 — End: 2021-09-17

## 2021-09-16 MED ORDER — ROCURONIUM 10 MG/ML INTRAVENOUS SYRINGE WRAPPER
INJECTION | Freq: Once | INTRAVENOUS | Status: DC | PRN
Start: 2021-09-16 — End: 2021-09-16
  Administered 2021-09-16: 40 mg via INTRAVENOUS
  Administered 2021-09-16: 5 mg via INTRAVENOUS

## 2021-09-16 MED ORDER — DEXTROSE 5 % AND 0.45 % SODIUM CHLORIDE INTRAVENOUS SOLUTION
INTRAVENOUS | Status: DC
Start: 2021-09-16 — End: 2021-09-16

## 2021-09-16 MED ORDER — DEXTROSE 5 % AND 0.45 % SODIUM CHLORIDE INTRAVENOUS SOLUTION
INTRAVENOUS | Status: AC
Start: 2021-09-16 — End: 2021-09-17

## 2021-09-16 MED ORDER — SUGAMMADEX 100 MG/ML INTRAVENOUS SOLUTION
Freq: Once | INTRAVENOUS | Status: DC | PRN
Start: 2021-09-16 — End: 2021-09-16
  Administered 2021-09-16: 250 mg via INTRAVENOUS

## 2021-09-16 MED ORDER — POTASSIUM, SODIUM PHOSPHATES 280 MG-160 MG-250 MG ORAL POWDER PACKET
1.0000 | Freq: Three times a day (TID) | ORAL | Status: AC
Start: 2021-09-16 — End: 2021-09-17
  Administered 2021-09-16: 1 via ORAL
  Administered 2021-09-16: 0 via ORAL
  Administered 2021-09-16: 1 via ORAL
  Filled 2021-09-16 (×2): qty 1

## 2021-09-16 MED ORDER — ATORVASTATIN 40 MG TABLET
40.0000 mg | ORAL_TABLET | Freq: Every evening | ORAL | Status: DC
Start: 2021-09-16 — End: 2021-09-17
  Administered 2021-09-16: 40 mg via ORAL
  Filled 2021-09-16: qty 1

## 2021-09-16 MED ORDER — MORPHINE 4 MG/ML INTRAVENOUS SOLUTION
6.0000 mg | INTRAVENOUS | Status: AC
Start: 2021-09-16 — End: 2021-09-16
  Administered 2021-09-16: 6 mg via INTRAVENOUS
  Filled 2021-09-16: qty 2

## 2021-09-16 MED ORDER — IOPAMIDOL 300 MG IODINE/ML (61 %) INTRAVENOUS SOLUTION
50.0000 mL | INTRAVENOUS | Status: AC
Start: 2021-09-16 — End: 2021-09-16
  Administered 2021-09-16: 16 mL

## 2021-09-16 MED ORDER — MAGNESIUM SULFATE 4 GRAM/100 ML (4 %) IN WATER INTRAVENOUS PIGGYBACK
4.0000 g | INJECTION | Freq: Once | INTRAVENOUS | Status: AC
Start: 2021-09-16 — End: 2021-09-16
  Administered 2021-09-16: 4 g via INTRAVENOUS
  Administered 2021-09-16: 0 g via INTRAVENOUS
  Filled 2021-09-16: qty 100

## 2021-09-16 MED ORDER — DICYCLOMINE 10 MG CAPSULE
20.0000 mg | ORAL_CAPSULE | Freq: Four times a day (QID) | ORAL | Status: DC
Start: 2021-09-16 — End: 2021-09-17
  Administered 2021-09-16 (×3): 20 mg via ORAL
  Administered 2021-09-16: 0 mg via ORAL
  Administered 2021-09-17: 20 mg via ORAL
  Filled 2021-09-16 (×8): qty 2

## 2021-09-16 MED ORDER — LOSARTAN 50 MG TABLET
100.0000 mg | ORAL_TABLET | Freq: Every day | ORAL | Status: DC
Start: 2021-09-16 — End: 2021-09-17
  Administered 2021-09-16 – 2021-09-17 (×2): 100 mg via ORAL
  Filled 2021-09-16 (×2): qty 2

## 2021-09-16 MED ORDER — ENOXAPARIN 40 MG/0.4 ML SUBCUTANEOUS SYRINGE
40.0000 mg | INJECTION | SUBCUTANEOUS | Status: DC
Start: 2021-09-16 — End: 2021-09-17
  Administered 2021-09-16 – 2021-09-17 (×2): 40 mg via SUBCUTANEOUS
  Filled 2021-09-16 (×2): qty 0.4

## 2021-09-16 MED ORDER — LACTOBACILLUS RHAMNOSUS GG 15 BILLION CELL SPRINKLE CAPSULE
1.0000 | ORAL_CAPSULE | Freq: Two times a day (BID) | ORAL | Status: DC
  Administered 2021-09-16 – 2021-09-17 (×2): 1 via ORAL
  Filled 2021-09-16 (×2): qty 1

## 2021-09-16 MED ORDER — ONDANSETRON HCL (PF) 4 MG/2 ML INJECTION SOLUTION
4.0000 mg | Freq: Once | INTRAMUSCULAR | Status: DC | PRN
Start: 2021-09-16 — End: 2021-09-16

## 2021-09-16 MED ORDER — DIAZEPAM 5 MG/ML INJECTION SYRINGE
5.0000 mg | INJECTION | Freq: Once | INTRAMUSCULAR | Status: AC | PRN
Start: 2021-09-16 — End: 2021-09-16
  Administered 2021-09-16: 5 mg via INTRAVENOUS

## 2021-09-16 MED ORDER — FENTANYL (PF) 50 MCG/ML INJECTION SOLUTION
12.5000 ug | INTRAMUSCULAR | Status: DC | PRN
Start: 2021-09-16 — End: 2021-09-16

## 2021-09-16 SURGICAL SUPPLY — 17 items
BALLOON 200CM 6-7FR 9-12MM 3 L_UM ABV INJ SQ SHLDR RTRVL BIL (INSTRUMENTS ENDOMECHANICAL) ×2
BALLOON RTRVL EXTRACT PRO RX 200CM 6-7FR 9-12MM BIL 3 LUM ABV INJ SQ SHLDR ACPT .035IN GW LTX STRL (ENDOSCOPIC SUPPLIES) ×1 IMPLANT
COVER ENDOSCP DIST STRL LF  DISP (ENDOSCOPIC SUPPLIES) ×1 IMPLANT
DEVICE ENDOS RX BIOPSY CAP OLMPS STRL DISP (ENDOSCOPIC SUPPLIES) ×1 IMPLANT
DEVICE LOCK RX BIOPSY CAP SYS_LMPS DISP (INSTRUMENTS ENDOMECHANICAL) ×2
ELECTRODE PATIENT RTN 9FT VLAB C30- LB RM PHSV ACRL FOAM CORD NONIRRITATE NONSENSITIZE ADH STRP (SURGICAL CUTTING SUPPLIES) ×1 IMPLANT
ELECTRODE PATIENT RTN 9FT VLAB_REM C30- LB PLHSV ACRL FOAM (CUTTING ELEMENTS) ×2
GW ENDOS .025IN 2700MM 70MM VISIGLIDE 2 HDRPH STRL LF  DISP (ENDOSCOPIC SUPPLIES) ×1 IMPLANT
GW ENDOS .025IN 2700MM 70MM VI_IGLIDE 2 HDRPH STRL LF DISP (INSTRUMENTS ENDOMECHANICAL) ×2
INTROD ENDOS 10FR 6FR 205CM RADOPQ OAS TRCR M DRCT STRL DISP PURP ACPT .035IN GW (ENDOSCOPIC SUPPLIES) ×1 IMPLANT
INTROD ENDOS 10FR 6FR 205CM RA_DOPQ OAS TRCR M DRCT STRL DISP (INSTRUMENTS ENDOMECHANICAL) ×2
KIT ENDOS CMPLN ENDOKIT ORCAPOD 4 1.1OZ (ENDOSCOPIC SUPPLIES) ×1 IMPLANT
KIT ENDOSCOPIC COMPLIANCE ENDOKIT ORCAPOD 4 1.1 OZ (INSTRUMENTS ENDOMECHANICAL) ×2
SINGLE USE DISTAL SCOPE COVER (INSTRUMENTS ENDOMECHANICAL) ×2
SPHINCT 20MM 5MM 3.9FR ATME RX CANNULATE CNTR LUM ENDOS ACPT .025IN GW (ENDOSCOPIC SUPPLIES) ×1 IMPLANT
SPHINCT 20MM 5MM 3.9FR ATME RX_CANNULATE CNTR LUM CUT WRE (INSTRUMENTS ENDOMECHANICAL) ×2
STENT C-L 10FR 9CM BIL TAPER TIP POSITION SLEEVE PROX DIST FLP PE NATURAL CURVE STRL PURP ACPT .035 (STENTS VASCULAR) ×1 IMPLANT

## 2021-09-16 NOTE — Anesthesia Transfer of Care (Signed)
ANESTHESIA TRANSFER OF CARE   Henry [name] is a 66 y.o. ,male, Weight: 126 kg (278 lb 3.5 oz)   had Procedure(s):  ERCP  performed  09/16/21   Primary Service: Beverlyn Roux, *    Past Medical History:   Diagnosis Date    Cancer (CMS [facility]) 11/2015    prostate cancer    Chronic pain     CPAP (continuous positive airway pressure) dependence     settings unknown    Deep vein thrombosis (DVT) (CMS [facility])     left leg    GERD (gastroesophageal reflux disease)     well controlled on omeprazole    Heartburn     HTN     Hyperlipidemia     Hypertension     Irritable bowel syndrome     Melanoma (CMS [place])     Neck problem     Osteoarthritis of back     Prostate cancer (CMS [place]) 12/11/2015    Sleep apnea     Wears glasses       Allergy History as of 09/16/21       SULFA (SULFONAMIDES)         Noted Status Severity Type Reaction    10/25/07 Metts, Lattie Haw, RN 10/25/07 Active   Rash              SHELLFISH CONTAINING PRODUCTS         Noted Status Severity Type Reaction    05/16/13 1057 Janann Colonel 04/04/13 Active Low  Itching, Swelling    Comments: Shrimp - eye swelling, itching     04/04/13 1402 Gerri Lins, MA 04/04/13 Active       Comments: shrimp                   I completed my transfer of care / handoff to the receiving personnel during which we discussed:   Last OR Temp: Temperature: 37.6 C (99.7 F)  ABG:  POTASSIUM   Date Value Ref Range Status   09/16/2021 4.0 3.5 - 5.1 mmol/L Final     Comment:     Hemolysis can alter results at this level (mild).   05/23/2013 4.5 3.5 - 5.1 mmol/L Final     KETONES   Date Value Ref Range Status   09/15/2021 Negative Negative mg/dL Final     CALCIUM   Date Value Ref Range Status   09/16/2021 7.3 (L) 8.8 - 10.2 mg/dL Final   05/23/2013 8.6 8.5 - 10.4 mg/dL Final     Calculated P Axis   Date Value Ref Range Status   09/15/2021 34 degrees Final     Calculated R Axis   Date Value Ref Range Status   09/15/2021 47 degrees Final     Calculated T Axis   Date Value Ref Range  Status   09/15/2021 66 degrees Final     Airway:* No LDAs found *  Blood pressure (!) 116/49, pulse 78, temperature 37.6 C (99.7 F), resp. rate (!) 21, height 1.753 m (5' 9" ), weight 126 kg (278 lb 3.5 oz), SpO2 94 %.

## 2021-09-16 NOTE — ED Nurses Note (Signed)
Report called to 6SE RN via phone

## 2021-09-16 NOTE — ED Nurses Note (Signed)
Pt to CT via RN escort

## 2021-09-16 NOTE — Progress Notes (Signed)
Department of Family Medicine  Inpatient Daily Progress Note     Kristoph, Sattler Dante Roudebush 66 y.o. male  Date of Birth:  Mar 30, 1955  Date of Admission:  09/15/2021      Date of service: 09/16/2021  CC: Abdominal pain    SUBJECTIVE  Overnight: NAEON.    This morning: Henry [name] is feeling well this morning. Denies any abdominal pain. Updated on plan to consult GI for possible choledocholithiasis. No questions or concerns at this time.    Denies chest pain, palpitations, dyspnea, fevers, chills.     ROS: As discussed above in the HPI; otherwise all other systems negative.    Current Medications:   atorvastatin (LIPITOR) tablet 40 mg QPM    dicyclomine (BENTYL) capsule 20 mg 4x/day    enoxaparin PF (LOVENOX) 40 mg/0.4 mL SubQ injection 40 mg Q24H    hydroCHLOROthiazide (HYDRODIURIL) tablet 25 mg Daily    lactobacillus rhamnosus (CULTURELLE) active cultures capsule 1 Capsule 2x/day-Food    losartan (COZAAR) tablet 100 mg Daily    metoprolol tartrate (LOPRESSOR) tablet 50 mg 2x/day    NS flush syringe 2-6 mL Q8HRS    pantoprazole (PROTONIX) delayed release tablet 40 mg Daily    piperacillin-tazobactam (ZOSYN) 4.5 g in NS 100 mL IVPB 4.5 g Q8H    potassium & sodium phosphate (PHOS-NA-K) 250-160-234m per oral packet 1 Packet 3x/day-Meals     OBJECTIVE  Vital Signs:  Patient Vitals for the past 24 hrs:   BP Temp Pulse Resp SpO2 Height Weight   09/16/21 1100 133/69 37 C (98.6 F) 84 20 95 % -- --   09/16/21 0700 112/63 37.2 C (99 F) (!) 130 20 92 % -- --   09/16/21 0300 (!) 159/84 36.7 C (98 F) (!) 121 20 94 % 1.753 m (5' 9" ) 126 kg (278 lb 3.5 oz)   09/16/21 0230 (!) 172/88 -- (!) 114 (!) 22 97 % -- --   09/16/21 0145 (!) 164/85 -- 98 (!) 24 92 % -- --   09/16/21 0100 (!) 176/104 -- 81 (!) 22 99 % -- --   09/16/21 0030 (!) 145/73 -- 69 18 99 % -- --   09/16/21 0000 (!) 159/67 -- 67 16 97 % -- --   09/15/21 2055 (!) 196/99 36.3 C (97.3 F) 73 15 96 % 1.753 m (5' 9" ) 127 kg (280 lb 10.3 oz)     I/O:  Date 09/15/21 0700 - 09/16/21  0659 09/16/21 0700 - 09/17/21 0659   Shift 0700-1459 1500-2259 2300-0659 24 Hour Total 0700-1459 1500-2259 2300-0659 24 Hour Total   INTAKE   IV Piggyback     200   200     Volume (magnesium sulfate 4 G in SW 100 mL premix IVPB)     100   100     Volume (piperacillin-tazobactam (ZOSYN) 4.5 g in NS 100 mL IVPB)     100   100   Shift Total(mL/kg)     200(1.58)   200(1.58)   OUTPUT   Urine             Urine Occurrence     1 x   1 x   Shift Total(mL/kg)           Weight (kg)  127.3 126.2 126.2 126.2 126.2 126.2 126.2     BM: Date of Last Bowel Movement: 09/16/21    Physical Exam:  General: appears in good health and no distress  Eyes: Conjunctiva clear., Pupils equal and round. ,  Sclera non-icteric.   HENT:ENMT without erythema or injection, mucous membranes moist.  Neck: supple, symmetrical, trachea midline and Neck ROM:  WNL  Lungs: Clear to auscultation bilaterally.   Cardiovascular: regular rate and rhythm, S1, S2 normal, no murmur, click, rub or gallop  Abdomen: Soft, non-tender, Bowel sounds normal, non-distended  Extremities: No cyanosis or edema  Skin: Skin warm and dry and No rashes  Neurologic: Grossly normal  Psychiatric: Normal    Labs: I have personally reviewed laboratory values.  Results for orders placed or performed during the hospital encounter of 09/15/21 (from the past 24 hour(s))   URINALYSIS, MACROSCOPIC AND MICROSCOPIC W/CULTURE REFLEX    Specimen: Urine, Clean Catch    Narrative    The following orders were created for panel order URINALYSIS, MACROSCOPIC AND MICROSCOPIC W/CULTURE REFLEX.  Procedure                               Abnormality         Status                     ---------                               -----------         ------                     URINALYSIS, MACROSCOPIC[[id number]]      Normal              Final result               URINALYSIS, MICROSCOPIC[[id number]]      Normal              Final result                 Please view results for these tests on the individual orders.    LIPASE   Result Value Ref Range    LIPASE 19 10 - 60 U/L   HEPATIC FUNCTION PANEL   Result Value Ref Range    ALBUMIN 3.9 3.4 - 4.8 g/dL     ALKALINE PHOSPHATASE 106 45 - 115 U/L    ALT (SGPT) 170 (H) 10 - 55 U/L    AST (SGOT)  203 (H) 8 - 45 U/L    BILIRUBIN TOTAL 0.8 0.3 - 1.3 mg/dL    BILIRUBIN DIRECT 0.3 0.1 - 0.4 mg/dL    PROTEIN TOTAL 7.1 6.0 - 8.0 g/dL   BASIC METABOLIC PANEL   Result Value Ref Range    SODIUM 139 136 - 145 mmol/L    POTASSIUM 3.8 3.5 - 5.1 mmol/L    CHLORIDE 103 96 - 111 mmol/L    CO2 TOTAL 22 (L) 23 - 31 mmol/L    ANION GAP 14 (H) 4 - 13 mmol/L    CALCIUM 8.8 8.8 - 10.2 mg/dL    GLUCOSE 155 (H) 65 - 125 mg/dL    BUN 19 8 - 25 mg/dL    CREATININE 1.04 0.75 - 1.35 mg/dL    BUN/CREA RATIO 18 6 - 22    ESTIMATED GFR 79 >=60 mL/min/BSA   CBC/DIFF    Narrative    The following orders were created for panel order CBC/DIFF.  Procedure  Abnormality         Status                     ---------                               -----------         ------                     CBC WITH FIEP[[id number]]                                    Final result                 Please view results for these tests on the individual orders.   TROPONIN-I (FOR ED ONLY)   Result Value Ref Range    TROPONIN I 14 <=30 ng/L   URINALYSIS, MACROSCOPIC   Result Value Ref Range    SPECIFIC GRAVITY 1.018 1.005 - 1.030    GLUCOSE Negative Negative mg/dL    PROTEIN Negative Negative mg/dL    BILIRUBIN Negative Negative mg/dL    UROBILINOGEN Negative Negative mg/dL    PH 5.5 5.0 - 8.0    BLOOD Negative Negative mg/dL    KETONES Negative Negative mg/dL    NITRITE Negative Negative    LEUKOCYTES Negative Negative WBCs/uL    APPEARANCE Clear Clear    COLOR Normal (Yellow) Normal (Yellow)   URINALYSIS, MICROSCOPIC   Result Value Ref Range    WBCS 1.0 <4.0 /hpf    RBCS 2.0 <6.0 /hpf    BACTERIA Occasional or less Occasional or less /hpf    HYALINE CASTS 3.0 <4.0 /lpf    SQUAMOUS EPITHELIAL CELLS Occasional or  less Occasional or less /lpf    MUCOUS Light Light /lpf   CBC WITH DIFF   Result Value Ref Range    WBC 7.8 3.7 - 11.0 x10^3/uL    RBC 5.21 4.50 - 6.10 x10^6/uL    HGB 14.8 13.4 - 17.5 g/dL    HCT 42.7 38.9 - 52.0 %    MCV 82.0 78.0 - 100.0 fL    MCH 28.4 26.0 - 32.0 pg    MCHC 34.7 31.0 - 35.5 g/dL    RDW-CV 13.1 11.5 - 15.5 %    PLATELETS 224 150 - 400 x10^3/uL    MPV 8.7 8.7 - 12.5 fL    NEUTROPHIL % 64 %    LYMPHOCYTE % 23 %    MONOCYTE % 8 %    EOSINOPHIL % 3 %    BASOPHIL % 1 %    NEUTROPHIL # 5.10 1.50 - 7.70 x10^3/uL    LYMPHOCYTE # 1.74 1.00 - 4.80 x10^3/uL    MONOCYTE # 0.63 0.20 - 1.10 x10^3/uL    EOSINOPHIL # 0.19 <=0.50 x10^3/uL    BASOPHIL # <0.10 <=0.20 x10^3/uL    IMMATURE GRANULOCYTE % 1 0 - 1 %    IMMATURE GRANULOCYTE # <0.10 <0.10 x10^3/uL   CBC/DIFF    Narrative    The following orders were created for panel order CBC/DIFF.  Procedure                               Abnormality         Status                     ---------                               -----------         ------  CBC WITH OQHU[[id number]]                Abnormal            Final result                 Please view results for these tests on the individual orders.   BASIC METABOLIC PANEL, NON-FASTING   Result Value Ref Range    SODIUM 136 136 - 145 mmol/L    POTASSIUM 4.0 3.5 - 5.1 mmol/L    CHLORIDE 106 96 - 111 mmol/L    CO2 TOTAL 20 (L) 23 - 31 mmol/L    ANION GAP 10 4 - 13 mmol/L    CALCIUM 7.3 (L) 8.8 - 10.2 mg/dL    GLUCOSE 139 (H) 65 - 125 mg/dL    BUN 16 8 - 25 mg/dL    CREATININE 0.85 0.75 - 1.35 mg/dL    BUN/CREA RATIO 19 6 - 22    ESTIMATED GFR >90 >=60 mL/min/BSA   MAGNESIUM   Result Value Ref Range    MAGNESIUM 1.4 (L) 1.8 - 2.6 mg/dL   PHOSPHORUS   Result Value Ref Range    PHOSPHORUS 1.6 (L) 2.3 - 4.0 mg/dL   HEPATIC FUNCTION PANEL - AM ONCE   Result Value Ref Range    ALBUMIN 3.4 3.4 - 4.8 g/dL     ALKALINE PHOSPHATASE 103 45 - 115 U/L    ALT (SGPT) 516 (H) 10 - 55 U/L    AST (SGOT)  586 (H) 8 - 45  U/L    BILIRUBIN TOTAL 2.1 (H) 0.3 - 1.3 mg/dL    BILIRUBIN DIRECT 0.8 (H) 0.1 - 0.4 mg/dL    PROTEIN TOTAL 6.3 6.0 - 8.0 g/dL   TROPONIN-I   Result Value Ref Range    TROPONIN I 9 0 - 30 ng/L   CBC WITH DIFF   Result Value Ref Range    WBC 6.3 3.7 - 11.0 x10^3/uL    RBC 4.64 4.50 - 6.10 x10^6/uL    HGB 13.2 (L) 13.4 - 17.5 g/dL    HCT 37.8 (L) 38.9 - 52.0 %    MCV 81.5 78.0 - 100.0 fL    MCH 28.4 26.0 - 32.0 pg    MCHC 34.9 31.0 - 35.5 g/dL    RDW-CV 13.2 11.5 - 15.5 %    PLATELETS 182 150 - 400 x10^3/uL    MPV 9.0 8.7 - 12.5 fL    NEUTROPHIL % 92 %    LYMPHOCYTE % 6 %    MONOCYTE % 2 %    EOSINOPHIL % 0 %    BASOPHIL % 0 %    NEUTROPHIL # 5.78 1.50 - 7.70 x10^3/uL    LYMPHOCYTE # 0.38 (L) 1.00 - 4.80 x10^3/uL    MONOCYTE # 0.12 (L) 0.20 - 1.10 x10^3/uL    EOSINOPHIL # <0.10 <=0.50 x10^3/uL    BASOPHIL # <0.10 <=0.20 x10^3/uL    IMMATURE GRANULOCYTE % 0 0 - 1 %    IMMATURE GRANULOCYTE # <0.10 <0.10 x10^3/uL      Radiology Results: I have personally reviewed imaging and imaging reports.  Results for orders placed or performed during the hospital encounter of 09/15/21 (from the past 72 hour(s))   [place] RT UPPER QUADRANT     Status: None    Narrative    Kashtyn MICHAEL Hornbrook  [place] RT UPPER QUADRANT performed on 09/15/2021 11:45 PM    INDICATION: 66 years old Male; epigastric  abdominal pain, elevated liver enzymes    TECHNIQUE: Transabdominal ultrasound of the right upper quadrant supplemented with color Doppler.    COMPARISON: CT abdomen pelvis 12/23/2015    FINDINGS:     PANCREAS: Pancreatic head and body is homogeneous in appearance. No pancreatic duct dilation. Pancreatic tail is obscured by overlying bowel gas.    LIVER: Liver contour is within normal limits. Suboptimal assessment of echotexture due to acoustic windows, which is grossly within normal limits. Liver size measures 20.4 cm in long axis of the right hepatic lobe. There is appropriate direction of blood flow in the portal vein and hepatic veins. The  echogenicity is increased. No focal hepatic lesion identified within study limitations. Focal areas of decreased echogenicity adjacent to the gallbladder could represent focal fatty sparing.    GALLBLADDER: Gallbladder is of normal size with normal wall thickness. No intraluminal gallstones. No pericholecystic free fluid.    CBD: The common bile duct measures up to 12 mm.    RIGHT KIDNEY: The right kidney measures 13.1 x 4.7 cm. Color Doppler confirms blood flow to the right kidney. Linear echogenic focus in the upper pole the right kidney measures up to 6 mm, there is no associated twinkle artifact. No hydronephrosis.      Impression    1.Hepatomegaly and diffuse hepatic steatosis. Areas of decreased echogenicity adjacent to the gallbladder may represent focal fatty sparing.  2.Dilated common bile duct, not definitively appreciated on the prior. No evidence of intrahepatic bile duct dilation. Consider MRCP for further assessment.  3.Suspected nonobstructing 6 mm calculus in the upper pole the right kidney.   CT ABDOMEN PELVIS W IV CONTRAST     Status: None    Narrative    Dominic MICHAEL Duda  Male, 66 years old.    CT ABDOMEN PELVIS W IV CONTRAST performed on 09/16/2021 1:22 AM.    REASON FOR EXAM:  severe diffuse abd pain    RADIATION DOSE: 1053.36 mGy.cm    CONTRAST: 100 ml's of Isovue 370    TECHNIQUE: CT abdomen pelvis with IV contrast    COMPARISON: MRI prostate 02/25/2016 and CT abdomen pelvis 12/23/2015    FINDINGS:     LUNGS: Included lung bases are clear.    ABDOMEN/PELVIS:    LIVER: Liver is normal in size and homogenously attenuated.    BILIARY SYSTEM/GALLBLADDER: Gallbladder is normal in size. No evidence of pericholecystic fluid or wall thickening. No intrahepatic bile duct dilation. The common bile duct measures up to 1.0 cm without obstructing lesion appreciated.    PANCREAS: Pancreas is uniform in appearance without duct dilation.    SPLEEN: Spleen is normal in size.    ADRENALS: Bilateral adrenals are  normal in size.    KIDNEYS/URETERS/BLADDER: Bilateral kidneys are normal in size. No hydronephrosis. No abnormal ureteral calculi or dilatation. The bladder is mostly decompressed.    REPRODUCTIVE ORGANS:Limited evaluation secondary to streak artifact from adjacent hip prosthesis.    BOWEL: Stomach is mostly decompressed. No abnormal bowel dilatation to suggest obstruction. Diverticulosis without acute diverticulitis    VASCULATURE/LYMPH NODES: Aortoiliac vasculature is of normal caliber. No mesenteric lymphadenopathy by size criteria.    PERITONEUM/RETROPERITONEUM: No free air or fluid.    BONES/SOFT TISSUES: No acute soft tissue or osseous abnormality. 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      Impression    Common bile duct dilation without obstructing lesion appreciated. No intrahepatic bile duct dilation. No CT evidence  of acute cholecystitis.         MRI MRCP WO CONTRAST     Status: None    Narrative    Alekai MICHAEL Nanney  Male, 66 years old.    MRI MRCP WO CONTRAST performed on 09/16/2021 5:03 AM.    REASON FOR EXAM:  abdominal pain, common bile duct dilation    INTRAVENOUS CONTRAST: 0 ml's     COMPARISON: CT 09/16/2021 and right upper quadrant ultrasound 09/15/2021      FINDINGS: Due to moderate motion due to patient's sleeping during exam contrast was not given.  LIVER:Liver is enlarged with redemonstration of diffuse fatty infiltrate of the liver with mild sparing near the gallbladder fossa. No focal liver lesions.    BILIARY DUCTS:Redemonstration of mild extrahepatic biliary dilatation measuring up to 10 mm involving the common hepatic duct and 8 mm involving the common bile duct. There is subtle outpouching of the distal common bile duct however measurements are 8 mm unchanged from more upstream dilatation. Abrupt caliber change at the ampulla is noted. There is mild T2 dark biliary wall thickening series 3, image 17 involving the common bile duct.  Minimal intrahepatic biliary dilatation is seen.    GALLBLADDER:Gallbladder is mildly distended without evidence of wall thickening or definite filling defects.    PANCREAS:Mild to moderate pancreatic atrophy without ductal dilatation or pancreas divisum.    KIDNEY/PROXIMAL URETERS:Mild bilateral perinephric stranding is noted. T2 bright cyst is seen in the lower pole right kidney. No hydronephrosis or suspicious soft tissue renal mass.    ADRENAL:No adrenal nodules.    SPLEEN:Spleen is borderline enlarged measuring 13.2 cm in length.    PERITONEAL CAVITY:No free fluid in the abdomen.    LYMPH NODES:No enlarged lymph nodes.        Impression    1.Redemonstration of mild intra and mild to moderate extrahepatic biliary dilatation. No filling defect is seen within the common bile duct. There is suggestion of mild common bile duct wall thickening. Findings may represent cholangitis in the appropriate clinical setting. Mild stricture at the level of the ampulla is also a consideration.  2.Hepatomegaly with hepatic steatosis.     Microbiology: No results found for any visits on 09/15/21 (from the past 96 hour(s)).     ASSESSMENT/PLAN:   66 y.o. male with past medical history of prostate cancer, DVT, GERD, HTN, HLD, IBS, osteoarthritis, OSA presenting with sudden onset abdominal pain starting on the evening of 7/31.  LFTs elevated and up trending. Imaging concerning for common bile duct dilation and 6 mm stone in upper pole of right kidney. Presentation, imaging, and labs most concerning for choledocholithiasis.    1. Choledocholithiasis  - AM CBC, BMP, EKG WNL  - AST up to 586 from 203 6 hours prior, ALT up to 516 from 170  - RUQ U/S (7/31):  Hepatomegaly and diffuse hepatic steatosis, dilated common bile duct, consider MRCP for further assessment, nonobstructing 6 mm calculus in upper pole of right kidney  - CT AP (8/1):  Common bile duct dilation without obstructing lesion appreciated, no intrahepatic bile duct  dilation, no evidence of acute cholecystitis  - MRCP w/o contrast d/t falling asleep during exam s/p benzo for claustrophobia (8/1)  - s/p morphine, fentanyl, ketamine for pain in the ED, currently denies any pain    Plan  - GI consulted   - will take for ERCP today 8/1  - Zofran p.r.n. and Compazine for nausea  - monitor pain and order regimen as  appropriate  - GI consulted  - NPO for ERCP  - mIV D5 1/2 NS  - Zosyn for ppx     2. Hypomagnesemia & Hypophosphatemia  - mg 1.4  - phos 1.6    Plan  - 4 g mgsulfate and PhosNaK replacement     Chronic Medical Conditions  HLD: Continue Lipitor 40 mg q.h.s.  HTN: Continue hydrochlorothiazide 25 mg daily, losartan 100 mg daily, and Lopressor 50 mg b.i.d.  IBS:  Continue home Bentyl 20 mg q.i.d.  GERD:  Protonix in place of home omeprazole    Status: Floor    Nutrition: NPO  Fluids: D5 1/2 NS  GI prophylaxis: Protonix  DVT/PE: Enoxaparin    PT/OT: n/i  Disposition: Likely home     Gaston Islam, MD 09/16/2021  Transitional Year, PGY-1       I saw and examined the patient.  I reviewed the resident's note.  I agree with the findings and plan of care as documented in the resident's note.  Any exceptions/additions are edited/noted.    Beverlyn Roux, MD

## 2021-09-16 NOTE — ED Nurses Note (Signed)
Pt transported to [facility] via central transport

## 2021-09-16 NOTE — Consults (Signed)
[facility]  Digestive Diseases Consult      Henry [name], Henry [name], 66 y.o. male  Encounter Start Date:  09/15/2021  Inpatient Admission Date:  09/16/2021  Date of service: 09/16/2021  Date of Birth:  Sep 13, 1955    Hospital Day:  LOS: 0 days     Chief Complaint:  abnormal liver enzymes    Assessment/Recommendations:   66 y.o M currently admitted for sudden onset abdominal pain associated with nausea. Found to have hepatocellular liver enzyme elevation with hyperbilirubinemia. Imaging concerning for ductal dilation suspected secondary to choledocholithiasis.     Problem List:  Abdominal pain  Nausea  Hepatocellular liver enzyme elevation  Hyperbilirubinemia  Abnormal imaging    Recommendations:  - Case reviewed with attending. Keep NPO. Will plan for ERCP today.  - Pain control and anti-emetics per primary service  - Antibiotics only if concern for infection is present.  - Please keep Hgb > 7, INR < 1.5 and Plt > 50  - Please page advanced GI for any additional queries.    HPI/Discussion:  Henry [name] is a 66 y.o., White male who presents with abdominal pain and abnormal liver enzymes. 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 Patient notes sudden onset abdominal pain associated with nausea around yesterday evening after consuming fatty food from outside. Notes localization to RUQ and epigastrum. Denies emesis, fevers, chills, confusion or jaundice. Presented to ER and was noted to have hepatocellular liver enzyme elevation with hyperbilirubinemia (AST 586 ALT 516 ALP 103 T.Bili 2.1). Underwent imaging which noted extrahepatic bile duct dilatation up to 1 cm. Admitted for further evaluation and management. Advanced GI consulted for possible endoscopic intervention.     Past Medical History:   Diagnosis Date    Cancer (CMS [facility]) 11/2015    prostate cancer    Chronic pain     CPAP (continuous positive airway pressure) dependence     settings unknown     Deep vein thrombosis (DVT) (CMS [facility])     left leg    GERD (gastroesophageal reflux disease)     well controlled on omeprazole    Heartburn     HTN     Hyperlipidemia     Hypertension     Irritable bowel syndrome     Melanoma (CMS [place])     Neck problem     Osteoarthritis of back     Prostate cancer (CMS [place]) 12/11/2015    Sleep apnea     Wears glasses          Past Surgical History:   Procedure Laterality Date    HX ANKLE FRACTURE TX Right     HX APPENDECTOMY      HX HIP REPLACEMENT Right 05/22/13    Per Dr. Caryl Comes    PB COLONOSCOPY,DIAGNOSTIC           Medications Prior to Admission       Prescriptions    Acetaminophen 500 mg Oral Capsule    Take 1 Cap (500 mg total) by mouth Every 6 hours as needed    atorvastatin (LIPITOR) 40 mg Oral Tablet    Take 1 Tablet (40 mg total) by mouth Once a day    diclofenac sodium (VOLTAREN) 75 mg Oral Tablet, Delayed Release (E.C.)    Take 1 Tablet (75 mg total) by mouth Twice daily    dicyclomine (BENTYL) 20 mg Oral Tablet    Take 1 Tablet (20 mg  total) by mouth Four times a day    GLUCOS CHOND CPLX ADVANCED ORAL    Take 2 Tabs by mouth Once a day Glucosamine-Chondroitin 1500 mg    hydroCHLOROthiazide (HYDRODIURIL) 25 mg Oral Tablet    Take 1 Tablet (25 mg total) by mouth Once a day    losartan (COZAAR) 100 mg Oral Tablet    Take 1 Tablet (100 mg total) by mouth Once a day    metoprolol tartrate (LOPRESSOR) 50 mg Oral Tablet    Take 1 Tablet (50 mg total) by mouth Twice daily    metroNIDAZOLE 1 % Gel with Pump    by Apply externally route Once a day    multivitamin Oral Tablet    Take 1 Tablet by mouth Once a day    Non-Formulary/Special Preparation (MEDICATION HELP)    CBD 2 capsules per day     Omeprazole 20 mg Oral Tablet, Delayed Release (E.C.)    Take by mouth Once a day    triamcinolone acetonide 0.1 % Cream    APPLY TWICE DAILY TO ECZEMA UP TO 2 WEEKS/MONTH AS NEEDED.          atorvastatin (LIPITOR) tablet, 40 mg, Oral, QPM  D5W 1/2 NS premix infusion, , Intravenous,  Continuous  D5W 250 mL flush bag, , Intravenous, Q15 Min PRN  dicyclomine (BENTYL) capsule, 20 mg, Oral, 4x/day  enoxaparin PF (LOVENOX) 40 mg/0.4 mL SubQ injection, 40 mg, Subcutaneous, Q24H  hydroCHLOROthiazide (HYDRODIURIL) tablet, 25 mg, Oral, Daily  lactobacillus rhamnosus (CULTURELLE) active cultures capsule, 1 Capsule, Oral, 2x/day-Food  losartan (COZAAR) tablet, 100 mg, Oral, Daily  magnesium sulfate 4 G in SW 100 mL premix IVPB, 4 g, Intravenous, Once  metoprolol tartrate (LOPRESSOR) tablet, 50 mg, Oral, 2x/day  NS 250 mL flush bag, , Intravenous, Q15 Min PRN  NS flush syringe, 2-6 mL, Intracatheter, Q8HRS  NS flush syringe, 2-6 mL, Intracatheter, Q1 MIN PRN  ondansetron (ZOFRAN) 2 mg/mL injection, 4 mg, Intravenous, Q8H PRN  pantoprazole (PROTONIX) delayed release tablet, 40 mg, Oral, Daily  piperacillin-tazobactam (ZOSYN) 4.5 g in NS 100 mL IVPB, 4.5 g, Intravenous, Now  piperacillin-tazobactam (ZOSYN) 4.5 g in NS 100 mL IVPB, 4.5 g, Intravenous, Q8H  potassium & sodium phosphate (PHOS-NA-K) 250-160-283m per oral packet, 1 Packet, Oral, 3x/day-Meals      Allergies   Allergen Reactions    Sulfa (Sulfonamides) Rash    Shellfish Containing Products Itching and Swelling     Shrimp - eye swelling, itching       Family History  Family Medical History:       Problem Relation (Age of Onset)    Asthma Father    COPD Father    Cancer Mother, Father    Coronary Artery Disease Mother    Diabetes Mother    High Cholesterol Mother    Hypertension (High Blood Pressure) Mother    Melanoma Brother    Migraines Mother            Social History  Social History     Socioeconomic History    Marital status: Married     Spouse name: Not on file    Number of children: 0    Years of education: Not on file    Highest education level: Not on file   Occupational History     Employer: [employer]    Occupation: retired     Comment: formerly worked administration in [employer]   Tobacco Use    Smoking status: Former  Types: Cigars      Quit date: 2012     Years since quitting: 11.5    Smokeless tobacco: Never    Tobacco comments:     quit cigars in 2012   Vaping Use    Vaping Use: Never used   Substance and Sexual Activity    Alcohol use: Yes     Alcohol/week: 2.0 standard drinks     Types: 2 Cans of beer per week     Comment: per week    Drug use: No     Comment: capsule    Sexual activity: Not on file   Other Topics Concern    Abuse/Domestic Violence No    Caffeine Concern No    Calcium intake adequate Yes    Computer Use Yes    Drives Yes    Exercise Concern No    Helmet Use Not Asked    Seat Belt Yes    Special Diet No    Sunscreen used Yes    Uses Cane Not Asked    Uses walker Not Asked    Uses wheelchair Not Asked    Right hand dominant Yes    Left hand dominant Not Asked    Ambidextrous Not Asked    Shift Work Not Asked    Unusual Sleep-Wake Schedule Not Asked    Ability to Walk 1 Flight of Steps without SOB/CP Yes    Routine Exercise Yes     Comment: [employer], 3-4 times a week, 1 hour,10 min     Ability to Walk 2 Flight of Steps without SOB/CP Yes    Unable to Ambulate Not Asked    Total Care Not Asked    Ability To Do Own ADL's Yes    Uses Walker Not Asked    Other Activity Level Yes     Comment: 14 steps at home. walks a lot. shoveled snow this winter. limited by pain.    Uses Cane Not Asked   Social History Narrative    Not on file     Social Determinants of Health     Financial Resource Strain: Not on file   Transportation Needs: Not on file   Social Connections: Not on file   Intimate Partner Violence: Not on file   Housing Stability: Not on file       Review of Systems:  General: Negative for fevers, chills or weight loss   Eyes:  Negative for change in vision   HENT: Negative for hearing loss, nasal congestion, epistaxis, sore mouth,  sore throat   Cardiovascular: Negative for chest pain, palpitations, lower extremity edema   Respiratory: Negative for cough, dyspnea, wheezing   GI: Negative for vomiting, diarrhea, constipation,  melena,  hematochezia, dysphagia, odynophagia   GU: Negative for hematuria, dysuria   Musculoskeletal: Negative for myalgias, arthralgias   Skin: Negative for rashes, lesions   Neurological: Negative for lightheadedness, headaches, dizziness, paresthesias, weakness   Hematological: Negative for easy bruising or bleeding   Psych:  Negative for depression, anxiety       EXAM:  Temperature: 37.2 C (99 F)  Heart Rate: (!) 130  BP (Non-Invasive): 112/63  Respiratory Rate: 20  SpO2: 92 %    General: In no acute distress  HEENT: Mucous membranes moist  Eyes: Conjunctiva non-icteric  Neck: Soft, supple  CV: Regular rate and rhythm  Pulm: Equal chest rise bilaterally  Abd: Soft, mildly tender in RUQ and epigastrum, nondistended with normal bowel sounds  Extrem: No  cyanosis or edema  Skin: Warm and Dry  Neuro: AAO x 3    Labs:  Lab Results Today:    Results for orders placed or performed during the hospital encounter of 09/15/21 (from the past 24 hour(s))   ECG 12-LEAD   Result Value Ref Range    Ventricular rate 67 BPM    Atrial Rate 67 BPM    PR Interval 208 ms    QRS Duration 96 ms    QT Interval 394 ms    QTC Calculation 416 ms    Calculated P Axis 34 degrees    Calculated R Axis 47 degrees    Calculated T Axis 66 degrees   LIPASE   Result Value Ref Range    LIPASE 19 10 - 60 U/L   HEPATIC FUNCTION PANEL   Result Value Ref Range    ALBUMIN 3.9 3.4 - 4.8 g/dL     ALKALINE PHOSPHATASE 106 45 - 115 U/L    ALT (SGPT) 170 (H) 10 - 55 U/L    AST (SGOT)  203 (H) 8 - 45 U/L    BILIRUBIN TOTAL 0.8 0.3 - 1.3 mg/dL    BILIRUBIN DIRECT 0.3 0.1 - 0.4 mg/dL    PROTEIN TOTAL 7.1 6.0 - 8.0 g/dL   BASIC METABOLIC PANEL   Result Value Ref Range    SODIUM 139 136 - 145 mmol/L    POTASSIUM 3.8 3.5 - 5.1 mmol/L    CHLORIDE 103 96 - 111 mmol/L    CO2 TOTAL 22 (L) 23 - 31 mmol/L    ANION GAP 14 (H) 4 - 13 mmol/L    CALCIUM 8.8 8.8 - 10.2 mg/dL    GLUCOSE 155 (H) 65 - 125 mg/dL    BUN 19 8 - 25 mg/dL    CREATININE 1.04 0.75 - 1.35 mg/dL     BUN/CREA RATIO 18 6 - 22    ESTIMATED GFR 79 >=60 mL/min/BSA   TROPONIN-I (FOR ED ONLY)   Result Value Ref Range    TROPONIN I 14 <=30 ng/L   CBC WITH DIFF   Result Value Ref Range    WBC 7.8 3.7 - 11.0 x10^3/uL    RBC 5.21 4.50 - 6.10 x10^6/uL    HGB 14.8 13.4 - 17.5 g/dL    HCT 42.7 38.9 - 52.0 %    MCV 82.0 78.0 - 100.0 fL    MCH 28.4 26.0 - 32.0 pg    MCHC 34.7 31.0 - 35.5 g/dL    RDW-CV 13.1 11.5 - 15.5 %    PLATELETS 224 150 - 400 x10^3/uL    MPV 8.7 8.7 - 12.5 fL    NEUTROPHIL % 64 %    LYMPHOCYTE % 23 %    MONOCYTE % 8 %    EOSINOPHIL % 3 %    BASOPHIL % 1 %    NEUTROPHIL # 5.10 1.50 - 7.70 x10^3/uL    LYMPHOCYTE # 1.74 1.00 - 4.80 x10^3/uL    MONOCYTE # 0.63 0.20 - 1.10 x10^3/uL    EOSINOPHIL # 0.19 <=0.50 x10^3/uL    BASOPHIL # <0.10 <=0.20 x10^3/uL    IMMATURE GRANULOCYTE % 1 0 - 1 %    IMMATURE GRANULOCYTE # <0.10 <0.10 x10^3/uL   URINALYSIS, MACROSCOPIC   Result Value Ref Range    SPECIFIC GRAVITY 1.018 1.005 - 1.030    GLUCOSE Negative Negative mg/dL    PROTEIN Negative Negative mg/dL    BILIRUBIN Negative Negative mg/dL    UROBILINOGEN  Negative Negative mg/dL    PH 5.5 5.0 - 8.0    BLOOD Negative Negative mg/dL    KETONES Negative Negative mg/dL    NITRITE Negative Negative    LEUKOCYTES Negative Negative WBCs/uL    APPEARANCE Clear Clear    COLOR Normal (Yellow) Normal (Yellow)   URINALYSIS, MICROSCOPIC   Result Value Ref Range    WBCS 1.0 <4.0 /hpf    RBCS 2.0 <6.0 /hpf    BACTERIA Occasional or less Occasional or less /hpf    HYALINE CASTS 3.0 <4.0 /lpf    SQUAMOUS EPITHELIAL CELLS Occasional or less Occasional or less /lpf    MUCOUS Light Light /lpf   BASIC METABOLIC PANEL, NON-FASTING   Result Value Ref Range    SODIUM 136 136 - 145 mmol/L    POTASSIUM 4.0 3.5 - 5.1 mmol/L    CHLORIDE 106 96 - 111 mmol/L    CO2 TOTAL 20 (L) 23 - 31 mmol/L    ANION GAP 10 4 - 13 mmol/L    CALCIUM 7.3 (L) 8.8 - 10.2 mg/dL    GLUCOSE 139 (H) 65 - 125 mg/dL    BUN 16 8 - 25 mg/dL    CREATININE 0.85 0.75 - 1.35 mg/dL     BUN/CREA RATIO 19 6 - 22    ESTIMATED GFR >90 >=60 mL/min/BSA   MAGNESIUM   Result Value Ref Range    MAGNESIUM 1.4 (L) 1.8 - 2.6 mg/dL   PHOSPHORUS   Result Value Ref Range    PHOSPHORUS 1.6 (L) 2.3 - 4.0 mg/dL   HEPATIC FUNCTION PANEL - AM ONCE   Result Value Ref Range    ALBUMIN 3.4 3.4 - 4.8 g/dL     ALKALINE PHOSPHATASE 103 45 - 115 U/L    ALT (SGPT) 516 (H) 10 - 55 U/L    AST (SGOT)  586 (H) 8 - 45 U/L    BILIRUBIN TOTAL 2.1 (H) 0.3 - 1.3 mg/dL    BILIRUBIN DIRECT 0.8 (H) 0.1 - 0.4 mg/dL    PROTEIN TOTAL 6.3 6.0 - 8.0 g/dL   TROPONIN-I   Result Value Ref Range    TROPONIN I 9 0 - 30 ng/L   CBC WITH DIFF   Result Value Ref Range    WBC 6.3 3.7 - 11.0 x10^3/uL    RBC 4.64 4.50 - 6.10 x10^6/uL    HGB 13.2 (L) 13.4 - 17.5 g/dL    HCT 37.8 (L) 38.9 - 52.0 %    MCV 81.5 78.0 - 100.0 fL    MCH 28.4 26.0 - 32.0 pg    MCHC 34.9 31.0 - 35.5 g/dL    RDW-CV 13.2 11.5 - 15.5 %    PLATELETS 182 150 - 400 x10^3/uL    MPV 9.0 8.7 - 12.5 fL    NEUTROPHIL % 92 %    LYMPHOCYTE % 6 %    MONOCYTE % 2 %    EOSINOPHIL % 0 %    BASOPHIL % 0 %    NEUTROPHIL # 5.78 1.50 - 7.70 x10^3/uL    LYMPHOCYTE # 0.38 (L) 1.00 - 4.80 x10^3/uL    MONOCYTE # 0.12 (L) 0.20 - 1.10 x10^3/uL    EOSINOPHIL # <0.10 <=0.50 x10^3/uL    BASOPHIL # <0.10 <=0.20 x10^3/uL    IMMATURE GRANULOCYTE % 0 0 - 1 %    IMMATURE GRANULOCYTE # <0.10 <0.10 x10^3/uL       Imaging Studies:      Results for orders placed  or performed during the hospital encounter of 09/15/21 (from the past 24 hour(s))   [place] RT UPPER QUADRANT     Status: None    Narrative    Aitan MICHAEL Weatherholtz  [place] RT UPPER QUADRANT performed on 09/15/2021 11:45 PM    INDICATION: 66 years old Male; epigastric abdominal pain, elevated liver enzymes    TECHNIQUE: Transabdominal ultrasound of the right upper quadrant supplemented with color Doppler.    COMPARISON: CT abdomen pelvis 12/23/2015    FINDINGS:     PANCREAS: Pancreatic head and body is homogeneous in appearance. No pancreatic duct dilation. Pancreatic  tail is obscured by overlying bowel gas.    LIVER: Liver contour is within normal limits. Suboptimal assessment of echotexture due to acoustic windows, which is grossly within normal limits. Liver size measures 20.4 cm in long axis of the right hepatic lobe. There is appropriate direction of blood flow in the portal vein and hepatic veins. The echogenicity is increased. No focal hepatic lesion identified within study limitations. Focal areas of decreased echogenicity adjacent to the gallbladder could represent focal fatty sparing.    GALLBLADDER: Gallbladder is of normal size with normal wall thickness. No intraluminal gallstones. No pericholecystic free fluid.    CBD: The common bile duct measures up to 12 mm.    RIGHT KIDNEY: The right kidney measures 13.1 x 4.7 cm. Color Doppler confirms blood flow to the right kidney. Linear echogenic focus in the upper pole the right kidney measures up to 6 mm, there is no associated twinkle artifact. No hydronephrosis.      Impression    1.Hepatomegaly and diffuse hepatic steatosis. Areas of decreased echogenicity adjacent to the gallbladder may represent focal fatty sparing.  2.Dilated common bile duct, not definitively appreciated on the prior. No evidence of intrahepatic bile duct dilation. Consider MRCP for further assessment.  3.Suspected nonobstructing 6 mm calculus in the upper pole the right kidney.   CT ABDOMEN PELVIS W IV CONTRAST     Status: None    Narrative    Tayshawn MICHAEL Helseth  Male, 66 years old.    CT ABDOMEN PELVIS W IV CONTRAST performed on 09/16/2021 1:22 AM.    REASON FOR EXAM:  severe diffuse abd pain    RADIATION DOSE: 1053.36 mGy.cm    CONTRAST: 100 ml's of Isovue 370    TECHNIQUE: CT abdomen pelvis with IV contrast    COMPARISON: MRI prostate 02/25/2016 and CT abdomen pelvis 12/23/2015    FINDINGS:     LUNGS: Included lung bases are clear.    ABDOMEN/PELVIS:    LIVER: Liver is normal in size and homogenously attenuated.    BILIARY SYSTEM/GALLBLADDER:  Gallbladder is normal in size. No evidence of pericholecystic fluid or wall thickening. No intrahepatic bile duct dilation. The common bile duct measures up to 1.0 cm without obstructing lesion appreciated.    PANCREAS: Pancreas is uniform in appearance without duct dilation.    SPLEEN: Spleen is normal in size.    ADRENALS: Bilateral adrenals are normal in size.    KIDNEYS/URETERS/BLADDER: Bilateral kidneys are normal in size. No hydronephrosis. No abnormal ureteral calculi or dilatation. The bladder is mostly decompressed.    REPRODUCTIVE ORGANS:Limited evaluation secondary to streak artifact from adjacent hip prosthesis.    BOWEL: Stomach is mostly decompressed. No abnormal bowel dilatation to suggest obstruction. Diverticulosis without acute diverticulitis    VASCULATURE/LYMPH NODES: Aortoiliac vasculature is of normal caliber. No mesenteric lymphadenopathy by size criteria.    PERITONEUM/RETROPERITONEUM: No free air  or fluid.    BONES/SOFT TISSUES: No acute soft tissue or osseous abnormality. 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      Impression    Common bile duct dilation without obstructing lesion appreciated. No intrahepatic bile duct dilation. No CT evidence of acute cholecystitis.         MRI MRCP WO CONTRAST     Status: None    Narrative    Jakai MICHAEL Mctighe  Male, 66 years old.    MRI MRCP WO CONTRAST performed on 09/16/2021 5:03 AM.    REASON FOR EXAM:  abdominal pain, common bile duct dilation    INTRAVENOUS CONTRAST: 0 ml's     COMPARISON: CT 09/16/2021 and right upper quadrant ultrasound 09/15/2021      FINDINGS: Due to moderate motion due to patient's sleeping during exam contrast was not given.  LIVER:Liver is enlarged with redemonstration of diffuse fatty infiltrate of the liver with mild sparing near the gallbladder fossa. No focal liver lesions.    BILIARY DUCTS:Redemonstration of mild extrahepatic biliary dilatation measuring up to 10  mm involving the common hepatic duct and 8 mm involving the common bile duct. There is subtle outpouching of the distal common bile duct however measurements are 8 mm unchanged from more upstream dilatation. Abrupt caliber change at the ampulla is noted. There is mild T2 dark biliary wall thickening series 3, image 17 involving the common bile duct. Minimal intrahepatic biliary dilatation is seen.    GALLBLADDER:Gallbladder is mildly distended without evidence of wall thickening or definite filling defects.    PANCREAS:Mild to moderate pancreatic atrophy without ductal dilatation or pancreas divisum.    KIDNEY/PROXIMAL URETERS:Mild bilateral perinephric stranding is noted. T2 bright cyst is seen in the lower pole right kidney. No hydronephrosis or suspicious soft tissue renal mass.    ADRENAL:No adrenal nodules.    SPLEEN:Spleen is borderline enlarged measuring 13.2 cm in length.    PERITONEAL CAVITY:No free fluid in the abdomen.    LYMPH NODES:No enlarged lymph nodes.        Impression    1.Redemonstration of mild intra and mild to moderate extrahepatic biliary dilatation. No filling defect is seen within the common bile duct. There is suggestion of mild common bile duct wall thickening. Findings may represent cholangitis in the appropriate clinical setting. Mild stricture at the level of the ampulla is also a consideration.  2.Hepatomegaly with hepatic steatosis.     Joyice Faster, M.D.  Gastroenterology & Hepatology Fellow  09/16/2021 - 10:03      09/16/2021  I saw and examined the patient.  I reviewed the fellow's note.  I agree with the findings and plan of care as documented in the fellow's note.  Any exceptions/additions are edited/noted.    Nelle Don, MD

## 2021-09-16 NOTE — Anesthesia Postprocedure Evaluation (Signed)
Anesthesia Post Op Evaluation    Patient: Henry [name] [facility]  Procedure(s):  ERCP    Last Vitals:Temperature: 37.6 C (99.7 F) (09/16/21 1517)  Heart Rate: 78 (09/16/21 1517)  BP (Non-Invasive): (!) 116/49 (09/16/21 1517)  Respiratory Rate: (!) 21 (09/16/21 1517)  SpO2: 94 % (09/16/21 1517)    No notable events documented.      Patient location during evaluation: PACU       Patient participation: complete - patient participated  Level of consciousness: awake    Pain management: adequate  Airway patency: patent    Anesthetic complications: no  Cardiovascular status: acceptable  Respiratory status: acceptable  Hydration status: acceptable  Patient post-procedure temperature: Pt Normothermic   PONV Status: Absent

## 2021-09-16 NOTE — ED Nurses Note (Signed)
Pt on phone with MRI staff answering clearing questions at this time.

## 2021-09-16 NOTE — Ancillary Notes (Signed)
[facility]  MRI Technologist Note        MRI has been completed.   Only  Panc mrcp wo completed .    Sent imaging for review since pt received IV valium and falling asleep during breath holds.   Per Luther Hearing do not proceed with contrast at this time .        Elvina Mattes, RT (R)(MR) 09/16/2021, 05:03

## 2021-09-16 NOTE — Anesthesia Preprocedure Evaluation (Signed)
ANESTHESIA PRE-OP EVALUATION  Planned Procedure: ERCP (Mouth)  Review of Systems     anesthesia history negative               Pulmonary   sleep apnea (CPAP for 12 years),   Cardiovascular    Hypertension, hyperlipidemia and DVT (66years old) , Exercise Tolerance: > or = 4 METS        GI/Hepatic/Renal    possible choledocholithiasis, GERD and well controlled        Endo/Other    morbid obesity,      Neuro/Psych/MS        Cancer  prostate cancer (2018, radiation),  melanoma left neck, 2022                    Physical Assessment      Airway       Mallampati: III    TM distance: >3 FB    Neck ROM: full  Mouth Opening: good.    [place]       Dentition intact             Pulmonary    Breath sounds clear to auscultation       Cardiovascular    Rhythm: regular  Rate: Normal       Other findings            Plan  ASA 3     Planned anesthesia type: general     general anesthesia with endotracheal tube intubation                PONV/POV Plan:  I plan to administer pharmcologic prophalaxis antiemetics  Intravenous induction     Anesthesia issues/risks discussed are: Dental Injuries, Post-op Pain Management, Stroke, Nerve Injuries, Intraoperative Awareness/ Recall, Sore Throat, Eye /Visual Loss, Aspiration, PONV, Cardiac Events/MI, Blood Loss and Post-op Cognitive Dysfunction.  Anesthetic plan and risks discussed with patient  signed consent obtained        Use of blood products discussed with patient who consented to blood products.      Patient's NPO status is appropriate for Anesthesia.           Plan discussed with CRNA.

## 2021-09-16 NOTE — H&P (Signed)
Department of Family Medicine  Inpatient History and Physical    Henry [name]  MRN: [id number]  Date of Birth:  [birth year]    PCP: Dola Argyle, MD     Date of Admission: 09/16/21    CHIEF COMPLAINT:  abdominal pain    HISTORY OF PRESENT ILLNESS   Henry [name] is a 66 y.o. male with past medical history of prostate cancer, DVT, GERD, HTN, HLD, IBS, osteoarthritis, OSA.     Patient presents with sudden-onset abdominal pain starting around 5:00 p.m. yesterday evening.  He reports the pain initially improved with a dose of Pepto-Bismol, but then returned shortly after.  He rates the pain an 8/10.  Prior to the pain starting, patient reports he was out to eat and had a greasy meal which he does not often do.  He states the pain started in his upper middle and upper right abdomen and has since progressed to his flanks.  He has not had a pain like this before.  Since arriving to the ED, he has also had nausea associated with his pain.  He reports intermittent episodes of hematuria since May.  He was seen in clinic at the beginning of July for this and labs at that time were unremarkable.  He denies any recent episodes of hematuria.  He denies any fevers/chills, vomiting, shortness of breath, dysuria, constipation, diarrhea.  His last bowel movement was yesterday afternoon.    In the ED:  Afebrile, hypertensive to the 170s in slightly tachypneic likely secondary to pain, otherwise vitals within normal limits.  Labs largely unremarkable, except for an elevated AST of 203 and elevated ALT 170.  Initial troponin 14.  UA unremarkable.  Right upper quadrant ultrasound completed showing hepatomegaly and diffuse hepatic steatosis, dilated common bile duct, no intrahepatic bile duct dilation, suspected nonobstructing 6 mm calculus in the upper pole of the right kidney.  CT abdomen pelvis with IV contrast completed showing common bile duct dilation without obstructing lesion appreciated in no evidence of acute  cholecystitis.  Patient was given dose of Zofran, Robinul, fentanyl, and morphine without much improvement in his abdominal pain.  His pain improved slightly with a dose of ketamine. DFM called for admission for further evaluation of abdominal pain with MRCP and pain control.    --------------------------------------------------------------------------------------  Past Medical History:   Diagnosis Date    Cancer (CMS [place]) 11/2015    prostate cancer    Chronic pain     CPAP (continuous positive airway pressure) dependence     settings unknown    Deep vein thrombosis (DVT) (CMS [facility])     left leg    GERD (gastroesophageal reflux disease)     well controlled on omeprazole    Heartburn     HTN     Hyperlipidemia     Hypertension     Irritable bowel syndrome     Melanoma (CMS [place])     Neck problem     Osteoarthritis of back     Prostate cancer (CMS [place]) 12/11/2015    Sleep apnea     Wears glasses          Past Surgical History:   Procedure Laterality Date    HX ANKLE FRACTURE TX Right     HX APPENDECTOMY      HX HIP REPLACEMENT Right 05/22/13    Per Dr. Caryl Comes    PB COLONOSCOPY,DIAGNOSTIC           Family Medical History:  Problem Relation (Age of Onset)    Asthma Father    COPD Father    Cancer Mother, Father    Coronary Artery Disease Mother    Diabetes Mother    High Cholesterol Mother    Hypertension (High Blood Pressure) Mother    Melanoma Brother    Migraines Mother            Social History     Tobacco Use    Smoking status: Former     Types: Cigars     Quit date: 2012     Years since quitting: 11.5    Smokeless tobacco: Never    Tobacco comments:     quit cigars in 2012   Vaping Use    Vaping Use: Never used   Substance Use Topics    Alcohol use: Yes     Alcohol/week: 2.0 standard drinks     Types: 2 Cans of beer per week     Comment: per week    Drug use: No     Comment: capsule      Medications Prior to Admission       Prescriptions    Acetaminophen 500 mg Oral Capsule    Take 1 Cap (500 mg total) by mouth  Every 6 hours as needed    atorvastatin (LIPITOR) 40 mg Oral Tablet    Take 1 Tablet (40 mg total) by mouth Once a day    diclofenac sodium (VOLTAREN) 75 mg Oral Tablet, Delayed Release (E.C.)    Take 1 Tablet (75 mg total) by mouth Twice daily    dicyclomine (BENTYL) 20 mg Oral Tablet    Take 1 Tablet (20 mg total) by mouth Four times a day    GLUCOS CHOND CPLX ADVANCED ORAL    Take 2 Tabs by mouth Once a day Glucosamine-Chondroitin 1500 mg    hydroCHLOROthiazide (HYDRODIURIL) 25 mg Oral Tablet    Take 1 Tablet (25 mg total) by mouth Once a day    losartan (COZAAR) 100 mg Oral Tablet    Take 1 Tablet (100 mg total) by mouth Once a day    metoprolol tartrate (LOPRESSOR) 50 mg Oral Tablet    Take 1 Tablet (50 mg total) by mouth Twice daily    metroNIDAZOLE 1 % Gel with Pump    by Apply externally route Once a day    multivitamin Oral Tablet    Take 1 Tablet by mouth Once a day    Non-Formulary/Special Preparation (MEDICATION HELP)    CBD 2 capsules per day     Omeprazole 20 mg Oral Tablet, Delayed Release (E.C.)    Take by mouth Once a day    triamcinolone acetonide 0.1 % Cream    APPLY TWICE DAILY TO ECZEMA UP TO 2 WEEKS/MONTH AS NEEDED.           Allergies   Allergen Reactions    Sulfa (Sulfonamides) Rash    Shellfish Containing Products Itching and Swelling     Shrimp - eye swelling, itching        --------------------------------------------------------------------------------------  REVIEW OF SYSTEMS: As discussed above in the HPI; otherwise all other systems negative.    PHYSICAL EXAM  Temperature: 36.3 C (97.3 F)  Heart Rate: 98  BP (Non-Invasive): (!) 172/88  Respiratory Rate: (!) 24  SpO2: 97 %  General: acutely ill and mild distress  Eyes: Conjunctiva clear., Pupils equal and round.   HENT:ENMT without erythema or injection, mucous membranes moist.  Neck: supple,  symmetrical, trachea midline  Lungs: Clear to auscultation bilaterally.   Cardiovascular: regular rate and rhythm  Abdomen:  Soft, moderately  tender to palpation of epigastric region and right upper quadrant (exam completed following dose of ketamine), bowel sounds normal  Extremities: No cyanosis or edema  Skin: Skin warm and dry  Neurologic: Grossly normal, CN II - XII grossly intact , Alert and oriented x3  Lymphatics: No lymphadenopathy  Psychiatric: Normal    LABS  I have reviewed all lab results.    RADIOLOGY RESULTS   Results for orders placed or performed during the hospital encounter of 09/15/21 (from the past 72 hour(s))   [place] RT UPPER QUADRANT     Status: None    Narrative    Hitoshi MICHAEL Raptis  [place] RT UPPER QUADRANT performed on 09/15/2021 11:45 PM    INDICATION: 66 years old Male; epigastric abdominal pain, elevated liver enzymes    TECHNIQUE: Transabdominal ultrasound of the right upper quadrant supplemented with color Doppler.    COMPARISON: CT abdomen pelvis 12/23/2015    FINDINGS:     PANCREAS: Pancreatic head and body is homogeneous in appearance. No pancreatic duct dilation. Pancreatic tail is obscured by overlying bowel gas.    LIVER: Liver contour is within normal limits. Suboptimal assessment of echotexture due to acoustic windows, which is grossly within normal limits. Liver size measures 20.4 cm in long axis of the right hepatic lobe. There is appropriate direction of blood flow in the portal vein and hepatic veins. The echogenicity is increased. No focal hepatic lesion identified within study limitations. Focal areas of decreased echogenicity adjacent to the gallbladder could represent focal fatty sparing.    GALLBLADDER: Gallbladder is of normal size with normal wall thickness. No intraluminal gallstones. No pericholecystic free fluid.    CBD: The common bile duct measures up to 12 mm.    RIGHT KIDNEY: The right kidney measures 13.1 x 4.7 cm. Color Doppler confirms blood flow to the right kidney. Linear echogenic focus in the upper pole the right kidney measures up to 6 mm, there is no associated twinkle artifact. No  hydronephrosis.      Impression    1.Hepatomegaly and diffuse hepatic steatosis. Areas of decreased echogenicity adjacent to the gallbladder may represent focal fatty sparing.  2.Dilated common bile duct, not definitively appreciated on the prior. No evidence of intrahepatic bile duct dilation. Consider MRCP for further assessment.  3.Suspected nonobstructing 6 mm calculus in the upper pole the right kidney.   CT ABDOMEN PELVIS W IV CONTRAST     Status: None    Narrative    Ladarren MICHAEL Hnat  Male, 66 years old.    CT ABDOMEN PELVIS W IV CONTRAST performed on 09/16/2021 1:22 AM.    REASON FOR EXAM:  severe diffuse abd pain    RADIATION DOSE: 1053.36 mGy.cm    CONTRAST: 100 ml's of Isovue 370    TECHNIQUE: CT abdomen pelvis with IV contrast    COMPARISON: MRI prostate 02/25/2016 and CT abdomen pelvis 12/23/2015    FINDINGS:     LUNGS: Included lung bases are clear.    ABDOMEN/PELVIS:    LIVER: Liver is normal in size and homogenously attenuated.    BILIARY SYSTEM/GALLBLADDER: Gallbladder is normal in size. No evidence of pericholecystic fluid or wall thickening. No intrahepatic bile duct dilation. The common bile duct measures up to 1.0 cm without obstructing lesion appreciated.    PANCREAS: Pancreas is uniform in appearance without duct dilation.  SPLEEN: Spleen is normal in size.    ADRENALS: Bilateral adrenals are normal in size.    KIDNEYS/URETERS/BLADDER: Bilateral kidneys are normal in size. No hydronephrosis. No abnormal ureteral calculi or dilatation. The bladder is mostly decompressed.    REPRODUCTIVE ORGANS:Limited evaluation secondary to streak artifact from adjacent hip prosthesis.    BOWEL: Stomach is mostly decompressed. No abnormal bowel dilatation to suggest obstruction. Diverticulosis without acute diverticulitis    VASCULATURE/LYMPH NODES: Aortoiliac vasculature is of normal caliber. No mesenteric lymphadenopathy by size criteria.    PERITONEUM/RETROPERITONEUM: No free air or fluid.    BONES/SOFT  TISSUES: No acute soft tissue or osseous abnormality. 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      Impression    Common bile duct dilation without obstructing lesion appreciated. No intrahepatic bile duct dilation. No CT evidence of acute cholecystitis.             MICROBIOLOGY  No results found for any visits on 09/15/21 (from the past 96 hour(s)).     DNR STATUS:  Full code    ASSESSMENT/PLAN:  66 y.o. male with past medical history of prostate cancer, DVT, GERD, HTN, HLD, IBS, osteoarthritis, OSA presenting with sudden onset abdominal pain starting on the evening of 7/31.  LFTs elevated and imaging concerning for common bile duct dilation and 6 mm stone in upper pole of right kidney    Admit to DFM  Attending: Dr. Raleigh Lions q4hrs  Floor Status    1) Abdominal Pain  Nausea  - AST 203, ALT 170, creatinine within normal limits, UA unremarkable, lipase within normal limits  - RUQ U/S (7/31):  Hepatomegaly and diffuse hepatic steatosis, dilated common bile duct, consider MRCP for further assessment, nonobstructing 6 mm calculus in upper pole of right kidney  - CT AP (8/1):  Common bile duct dilation without obstructing lesion appreciated, no intrahepatic bile duct dilation, no evidence of acute cholecystitis  - Concern for choledocholithiasis versus nephrolithiasis; less likely:  Cholecystitis, gastroenteritis, GERD, cardiac etiology  -unable to control pain s/p doses of morphine, fentanyl, Robinul, ketamine  Plan  - MRCP ordered, spoke with MRI and can likely be done this morning.    - one time dose of valium ordered for claustrophobia prior to MRCP  - pending results of MRCP, consider GI consult  - Zofran p.r.n. and Compazine for nausea  - will re-evaluate pain following dose of ketamine and order medications as appropriate  - NPO for MRCP; will advance diet as tolerated pending imaging results if no intervention is needed      Chronic Medical  Conditions  HLD: Continue Lipitor 40 mg q.h.s.  HTN: Continue hydrochlorothiazide 25 mg daily, losartan 100 mg daily, and Lopressor 50 mg b.i.d.  IBS:  Continue home Bentyl 20 mg q.i.d.  GERD:  Protonix in place of home omeprazole        Nutrition: NPO  Fluids: n/i  GI prophylaxis: Protonix  DVT/PE: Lovenox    PT/OT: n/i  Disposition: Likely home pending MRCP results and pain control       Glendell Docker, MD  PGY-3 [facility]  Pager [phone]      I saw and examined the patient.  I reviewed the resident's note.  I agree with the findings and plan of care as documented in the resident's note.  Any exceptions/additions are edited/noted.    Beverlyn Roux, MD

## 2021-09-16 NOTE — ED Nurses Note (Signed)
Pt to u/s via EDT escort

## 2021-09-17 ENCOUNTER — Other Ambulatory Visit (INDEPENDENT_AMBULATORY_CARE_PROVIDER_SITE_OTHER): Payer: Self-pay | Admitting: NURSE PRACTITIONER-GERONTOLOGY

## 2021-09-17 DIAGNOSIS — Z79899 Other long term (current) drug therapy: Secondary | ICD-10-CM

## 2021-09-17 DIAGNOSIS — K8309 Other cholangitis: Secondary | ICD-10-CM

## 2021-09-17 DIAGNOSIS — R748 Abnormal levels of other serum enzymes: Secondary | ICD-10-CM

## 2021-09-17 DIAGNOSIS — K8050 Calculus of bile duct without cholangitis or cholecystitis without obstruction: Secondary | ICD-10-CM

## 2021-09-17 LAB — HEPATIC FUNCTION PANEL
ALBUMIN: 3.2 g/dL — ABNORMAL LOW (ref 3.4–4.8)
ALKALINE PHOSPHATASE: 107 U/L (ref 45–115)
ALT (SGPT): 486 U/L — ABNORMAL HIGH (ref 10–55)
AST (SGOT): 207 U/L — ABNORMAL HIGH (ref 8–45)
BILIRUBIN DIRECT: 4.1 mg/dL — ABNORMAL HIGH (ref 0.1–0.4)
BILIRUBIN TOTAL: 5.3 mg/dL — ABNORMAL HIGH (ref 0.3–1.3)
PROTEIN TOTAL: 6.1 g/dL (ref 6.0–8.0)

## 2021-09-17 LAB — MAGNESIUM: MAGNESIUM: 2.5 mg/dL (ref 1.8–2.6)

## 2021-09-17 LAB — CBC WITH DIFF
BASOPHIL #: <0.1 10*3/uL (ref <=0.20)
BASOPHIL %: 0 %
EOSINOPHIL #: <0.1 10*3/uL (ref <=0.50)
EOSINOPHIL %: 1 %
HCT: 37.7 % — ABNORMAL LOW (ref 38.9–52.0)
HGB: 13 g/dL — ABNORMAL LOW (ref 13.4–17.5)
IMMATURE GRANULOCYTE #: <0.1 10*3/uL (ref <0.10)
IMMATURE GRANULOCYTE %: 0 % (ref 0–1)
LYMPHOCYTE #: 0.69 10*3/uL — ABNORMAL LOW (ref 1.00–4.80)
LYMPHOCYTE %: 6 %
MCH: 28.6 pg (ref 26.0–32.0)
MCHC: 34.5 g/dL (ref 31.0–35.5)
MCV: 83 fL (ref 78.0–100.0)
MONOCYTE #: 0.65 10*3/uL (ref 0.20–1.10)
MONOCYTE %: 6 %
MPV: 9.1 fL (ref 8.7–12.5)
NEUTROPHIL #: 9.95 10*3/uL — ABNORMAL HIGH (ref 1.50–7.70)
NEUTROPHIL %: 87 %
PLATELETS: 193 10*3/uL (ref 150–400)
RBC: 4.54 10*6/uL (ref 4.50–6.10)
RDW-CV: 13.5 % (ref 11.5–15.5)
WBC: 11.4 10*3/uL — ABNORMAL HIGH (ref 3.7–11.0)

## 2021-09-17 LAB — BASIC METABOLIC PANEL
ANION GAP: 10 mmol/L (ref 4–13)
BUN/CREA RATIO: 20 (ref 6–22)
BUN: 18 mg/dL (ref 8–25)
CALCIUM: 8.2 mg/dL — ABNORMAL LOW (ref 8.8–10.2)
CHLORIDE: 101 mmol/L (ref 96–111)
CO2 TOTAL: 25 mmol/L (ref 23–31)
CREATININE: 0.89 mg/dL (ref 0.75–1.35)
ESTIMATED GFR: >90 mL/min/BSA (ref >=60)
GLUCOSE: 154 mg/dL — ABNORMAL HIGH (ref 65–125)
POTASSIUM: 3.7 mmol/L (ref 3.5–5.1)
SODIUM: 136 mmol/L (ref 136–145)

## 2021-09-17 LAB — PHOSPHORUS: PHOSPHORUS: 3.1 mg/dL (ref 2.3–4.0)

## 2021-09-17 MED ORDER — AMOXICILLIN 875 MG-POTASSIUM CLAVULANATE 125 MG TABLET
1.0000 | ORAL_TABLET | Freq: Two times a day (BID) | ORAL | 0 refills | Status: AC
Start: 2021-09-17 — End: 2021-09-24

## 2021-09-17 MED ORDER — POTASSIUM CHLORIDE ER 20 MEQ TABLET,EXTENDED RELEASE(PART/CRYST)
20.0000 meq | ORAL_TABLET | Freq: Once | ORAL | Status: AC
Start: 2021-09-17 — End: 2021-09-17
  Administered 2021-09-17: 20 meq via ORAL
  Filled 2021-09-17: qty 1

## 2021-09-17 NOTE — Consults (Signed)
[facility]  Digestive Diseases Consult      Wayden, Schwertner, 66 y.o. male  Encounter Start Date:  09/15/2021  Inpatient Admission Date:  09/17/2021  Date of service: 09/17/2021  Date of Birth:  [birth year]    Hospital Day:  LOS: 0 days     Chief Complaint:  abnormal liver enzymes    Assessment/Recommendations:   66 y.o M currently admitted for sudden onset abdominal pain associated with nausea. Found to have hepatocellular liver enzyme elevation with hyperbilirubinemia. Imaging concerning for ductal dilation suspected secondary to choledocholithiasis. ERCP completed 08/01 suggestive of cholangitis, no obstructing stone found though pus evacuated from major papilla and biliary tree.  CBD dilated and plastic stent placed.  Patient is doing well clinically.    Problem List:  Cholangitis  Abdominal pain, resolved  Nausea, resolved  Hepatocellular liver enzyme elevation, improving  Hyperbilirubinemia, improving  Abnormal imaging    Recommendations:  - Advance diet as tolerated  - Continue Abx course  - Will require repeat ERCP in approx 8 weeks, will schedule upon discharge  - Recommend General Surgery consultation to assess for CCY    GI team will sign-off at this time. Please page on-call GI fellow with any questions.    Subjective:  No complaints this AM. Overall feeling well. Tolerated diet without issues last evening.        atorvastatin (LIPITOR) tablet 40 mg QPM    dicyclomine (BENTYL) capsule 20 mg 4x/day    enoxaparin PF (LOVENOX) 40 mg/0.4 mL SubQ injection 40 mg Q24H    hydroCHLOROthiazide (HYDRODIURIL) tablet 25 mg Daily    lactobacillus rhamnosus (CULTURELLE) active cultures capsule 1 Capsule 2x/day-Food    losartan (COZAAR) tablet 100 mg Daily    metoprolol tartrate (LOPRESSOR) tablet 50 mg 2x/day    NS flush syringe 2-6 mL Q8HRS    NS flush syringe 2-6 mL Q8HRS    pantoprazole (PROTONIX) delayed release tablet 40 mg Daily    piperacillin-tazobactam (ZOSYN) 4.5 g in NS 100 mL IVPB 4.5 g  Q8H      EXAM:  Temperature: 36.6 C (97.9 F)  Heart Rate: 65  BP (Non-Invasive): (!) 143/79  Respiratory Rate: 18  SpO2: 97 %    General: In no acute distress  HEENT: Mucous membranes moist  Eyes: Conjunctiva non-icteric  Neck: Soft, supple  CV: Regular rate and rhythm  Pulm: Equal chest rise bilaterally  Abd: Soft, mildly tender in RUQ and epigastrum, nondistended with normal bowel sounds  Extrem: No cyanosis or edema  Skin: Warm and Dry  Neuro: AAO x 3      Labs:  Lab Results Today:    Results for orders placed or performed during the hospital encounter of 09/15/21 (from the past 24 hour(s))   BASIC METABOLIC PANEL   Result Value Ref Range    SODIUM 136 136 - 145 mmol/L    POTASSIUM 3.7 3.5 - 5.1 mmol/L    CHLORIDE 101 96 - 111 mmol/L    CO2 TOTAL 25 23 - 31 mmol/L    ANION GAP 10 4 - 13 mmol/L    CALCIUM 8.2 (L) 8.8 - 10.2 mg/dL    GLUCOSE 154 (H) 65 - 125 mg/dL    BUN 18 8 - 25 mg/dL    CREATININE 0.89 0.75 - 1.35 mg/dL    BUN/CREA RATIO 20 6 - 22    ESTIMATED GFR >90 >=60 mL/min/BSA   PHOSPHORUS   Result Value Ref Range    PHOSPHORUS 3.1  2.3 - 4.0 mg/dL   MAGNESIUM   Result Value Ref Range    MAGNESIUM 2.5 1.8 - 2.6 mg/dL   HEPATIC FUNCTION PANEL - AM ONCE   Result Value Ref Range    ALBUMIN 3.2 (L) 3.4 - 4.8 g/dL     ALKALINE PHOSPHATASE 107 45 - 115 U/L    ALT (SGPT) 486 (H) 10 - 55 U/L    AST (SGOT)  207 (H) 8 - 45 U/L    BILIRUBIN TOTAL 5.3 (H) 0.3 - 1.3 mg/dL    BILIRUBIN DIRECT 4.1 (H) 0.1 - 0.4 mg/dL    PROTEIN TOTAL 6.1 6.0 - 8.0 g/dL   CBC WITH DIFF   Result Value Ref Range    WBC 11.4 (H) 3.7 - 11.0 x10^3/uL    RBC 4.54 4.50 - 6.10 x10^6/uL    HGB 13.0 (L) 13.4 - 17.5 g/dL    HCT 37.7 (L) 38.9 - 52.0 %    MCV 83.0 78.0 - 100.0 fL    MCH 28.6 26.0 - 32.0 pg    MCHC 34.5 31.0 - 35.5 g/dL    RDW-CV 13.5 11.5 - 15.5 %    PLATELETS 193 150 - 400 x10^3/uL    MPV 9.1 8.7 - 12.5 fL    NEUTROPHIL % 87 %    LYMPHOCYTE % 6 %    MONOCYTE % 6 %    EOSINOPHIL % 1 %    BASOPHIL % 0 %    NEUTROPHIL # 9.95 (H) 1.50  - 7.70 x10^3/uL    LYMPHOCYTE # 0.69 (L) 1.00 - 4.80 x10^3/uL    MONOCYTE # 0.65 0.20 - 1.10 x10^3/uL    EOSINOPHIL # <0.10 <=0.50 x10^3/uL    BASOPHIL # <0.10 <=0.20 x10^3/uL    IMMATURE GRANULOCYTE % 0 0 - 1 %    IMMATURE GRANULOCYTE # <0.10 <0.10 x10^3/uL       Imaging Studies:  No updated imaging studies available for review    Endoscopic studies:  Findings:       The scout film was normal. The esophagus was successfully intubated        under direct vision. The scope was advanced to a normal major papilla in        the descending duodenum without detailed examination of the pharynx,        larynx and associated structures, and upper GI tract. The upper GI tract        was grossly normal. Pus was emerging from the major papilla. The bile        duct was deeply cannulated with the short-nosed traction sphincterotome.        Contrast was injected. I personally interpreted the bile duct images.        There was brisk flow of contrast through the ducts. Image quality was        adequate. Contrast extended to the entire biliary tree. The common bile        duct, common hepatic duct and left and right hepatic ducts and all        intrahepatic branches were normal. Opacification of the cystic duct        failed. A 0.025 inch x 270 cm straight Visiglide wire was passed into        the biliary tree. A 7 mm biliary sphincterotomy was made with a        monofilament Autotome sphincterotome using ERBE electrocautery. There        was no  post-sphincterotomy bleeding. To discover objects, the biliary        tree was swept with a 12 mm balloon starting at the bifurcation. Pus and        sludge was swept from the duct. One 10 Fr by 9 cm plastic stent with a        single external flap and a single internal flap was placed 8.5 cm into        the common bile duct. Bile flowed through the stent. The stent was in        good position. There was no inadvertent cannulation or injection of the        ventral pancreatic duct.    Impression:       - Pus was seen in the major papilla.        - A biliary sphincterotomy was performed.        - The biliary tree was swept and pus along with sludge was found. It is        possible that the passed the stone spontaneously.        - A 10 Fr by 9 cm plastic stent was placed due to findings of        cholangitis.     Glory Buff M.D.  Gastroenterology & Hepatology, PGY4        Patient seen and examined with the GI fellow. I was present for key portions of the H&P, and I participated in the plan of care. I agree with their findings and recommendations.     Nima Bamburg J. Theodora Blow, MD FASGE   Professor of Medicine  Director of Advanced Therapeutic Endoscopy  [facility]

## 2021-09-17 NOTE — Discharge Summary (Signed)
[facility]  DISCHARGE SUMMARY    PATIENT NAME:  Henry [name], Henry [name]  MRN:  [id number]  DOB:  1956-02-13    ENCOUNTER DATE:  09/15/2021  INPATIENT ADMISSION DATE: 09/17/2021  DISCHARGE DATE:  09/17/2021    ATTENDING PHYSICIAN: Beverlyn Roux, MD  SERVICE: DEPT OF FAMILY MEDICINE  PRIMARY CARE PHYSICIAN: Dola Argyle, MD       No lay caregiver identified.    PRIMARY DISCHARGE DIAGNOSIS: Abdominal pain  Active Hospital Problems    Diagnosis Date Noted    Principal Problem: Abdominal pain [R10.9] 09/16/2021    Morbid obesity with BMI of 40.0-44.9, adult (CMS HCC) [E66.01, Z68.41] 09/16/2021      Resolved Hospital Problems   No resolved problems to display.     Active Non-Hospital Problems    Diagnosis Date Noted    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    Lower back pain 12/10/2020    Hip pain 05/18/2017    Varicose veins of left lower extremity 05/18/2017    Paresthesia of upper and lower extremities of both sides 06/16/2016    Left leg pain 06/16/2016    Clinical trial participant 03/12/2016    Cervical disc disease 07/04/2013    S/P right total hip arthroplasty 05/22/2013    Sleep apnea     CPAP (continuous positive airway pressure) dependence     Hypertension     GERD (gastroesophageal reflux disease)     OSA on CPAP 06/26/2010    Normal colonoscopy 2008 01/30/2010    Primary osteoarthritis of left hip 11/15/2009    Osteoarthritis, knee 10/04/2009    Hypercholesterolemia 03/03/2006    Irritable bowel syndrome 11/06/2005    Hyperlipemia 05/24/2003           Current Discharge Medication List        START taking these medications.        Details   amoxicillin-pot clavulanate 875-125 mg Tablet  Commonly known as: AUGMENTIN   1 Tablet, Oral, 2 TIMES DAILY  Qty: 14 Tablet  Refills: 0            CONTINUE these medications - NO CHANGES were made during your visit.        Details   Acetaminophen 500 mg Capsule   500 mg, Oral, EVERY 6 HOURS PRN  Refills: 0     atorvastatin  40 mg Tablet  Commonly known as: LIPITOR   40 mg, Oral, DAILY  Qty: 90 Tablet  Refills: 3     diclofenac sodium 75 mg Tablet, Delayed Release (E.C.)  Commonly known as: VOLTAREN   75 mg, Oral, 2 TIMES DAILY  Qty: 180 Tablet  Refills: 3     dicyclomine 20 mg Tablet  Commonly known as: BENTYL   20 mg, Oral, 4 TIMES DAILY  Qty: 480 Tablet  Refills: 3     GLUCOS CHOND CPLX ADVANCED ORAL   2 Tablets, Oral, DAILY, Glucosamine-Chondroitin 1500 mg  Refills: 0     hydroCHLOROthiazide 25 mg Tablet  Commonly known as: HYDRODIURIL   25 mg, Oral, DAILY  Qty: 90 Tablet  Refills: 4     losartan 100 mg Tablet  Commonly known as: COZAAR   100 mg, Oral, DAILY  Qty: 90 Tablet  Refills: 3     metoprolol tartrate 50 mg Tablet  Commonly known as: LOPRESSOR   50 mg, Oral, 2 TIMES DAILY  Qty: 180 Tablet  Refills: 3     metroNIDAZOLE  1 % Gel with Pump   DAILY  Refills: 0     multivitamin Tablet   1 Tablet, Oral, DAILY  Refills: 0     Non-Formulary/Special Preparation  Commonly known as: MEDICATION HELP   CBD 2 capsules per day  Refills: 0     Omeprazole 20 mg Tablet, Delayed Release (E.C.)   Oral, DAILY  Refills: 0     triamcinolone acetonide 0.1 % Cream   APPLY TWICE DAILY TO ECZEMA UP TO 2 WEEKS/MONTH AS NEEDED.  Refills: 0            Discharge med list refreshed?  YES     Allergies   Allergen Reactions    Sulfa (Sulfonamides) Rash    Shellfish Containing Products Itching and Swelling     Shrimp - eye swelling, itching     HOSPITAL PROCEDURE(S):   Orders Placed This Encounter   Procedures    RETROGRADE CHOLANGIOPANCREATOGRAPHY ENDOSCOPIC     Surgical/Procedural Cases on this Admission       Case IDs Date Procedure Surgeon Location Status    [id number] 09/16/21 ERCP Nelle Don, MD [facility] [facility]     BRIEF HOSPITAL NARRATIVE:     Henry [name] is a 66 y.o. male with past medical history of prostate cancer, DVT, GERD, HTN, HLD, IBS, osteoarthritis, OSA who presented with  sudden-onset abdominal pain on 7/31. In the ED:  Afebrile, hypertensive to the 170s in slightly tachypneic likely secondary to pain, otherwise vitals within normal limits.  Labs largely unremarkable, except for an elevated AST of 203 and elevated ALT 170.  Initial troponin 14.  UA unremarkable.  Right upper quadrant ultrasound completed showing hepatomegaly and diffuse hepatic steatosis, dilated common bile duct, no intrahepatic bile duct dilation, suspected nonobstructing 6 mm calculus in the upper pole of the right kidney.  CT abdomen pelvis with IV contrast completed showing common bile duct dilation without obstructing lesion appreciated in no evidence of acute cholecystitis.  Patient was given dose of Zofran, Robinul, fentanyl, and morphine without much improvement in his abdominal pain.  His pain improved slightly with a dose of ketamine. DFM called for admission for further evaluation of abdominal pain with MRCP and pain control. Patient fell asleep during MRCP s/p benzo's for claustrophobia. GI was consulted and preformed ERCP which found sludge and pus but no stone. Sphincterotomy was preformed. GI recommended cholecystectomy and 7 days of antibiotics. Patient had no pain and was stable on discharge with instructions to complete 7 day course of augmenting and to follow up with discharge clinic and Dr. Odis Hollingshead with Mon general surgery to schedule cholecystectomy outpatient.      TRANSITION/POST DISCHARGE CARE/PENDING TESTS/REFERRALS:   - Discharge clinic 8/7 @ 12:30  - Dr. Odis Hollingshead clinic 8/16 @ 9:30    CONDITION ON DISCHARGE:  A. Ambulation: Full ambulation  B. Self-care Ability: Complete  C. Cognitive Status Alert and Oriented x 3  D. Code status at discharge:       LINES/DRAINS/WOUNDS AT DISCHARGE:   Patient Lines/Drains/Airways Status       Active Line / Dialysis Catheter / Dialysis Graft / Drain / Airway / Wound       Name Placement date Placement time Site Days    Peripheral IV Right Median Cubital   (antecubital fossa) 09/15/21  2114  -- 1  DISCHARGE DISPOSITION:  Home discharge  DISCHARGE INSTRUCTIONS:  Post-Discharge Follow Up Appointments       Follow up with Family Medicine, [place]    Phone: [phone]    Where: [place] [address]      Monday Sep 22, 2021    Return Patient Visit with Provider, Discharge Clinic Resident Family Med Poc at 12:40 PM      Tuesday Nov 04, 2021    Return Patient Visit with Larey Days, APRN,NP-C at 10:30 AM      Monday Dec 08, 2021    Return Patient Visit with Dola Argyle, MD at  9:30 AM      Family Medicine, [place], [place]  [address]  [place] [address]  [phone] Family Medicine, [facility], [place] [address]  [place], [place] [address]  [phone]             DISCHARGE INSTRUCTION - MISC    Please take 7 days of Augmentin 2 times a day.     An appointment has been made for you with Dr. Odis Hollingshead at [facility] general surgery on 8/16 at 9:30.    An appointment has been made for you in [facility] Medicine Discharge Clinic on 8/7 at 12:40.  Your appointment location is on the 3rd floor in the [place] here at [facility]. On third floor, take left off of the elevator. Follow sign for [facility] Medicine Discharge Clinic. If you will be unable to come to this appointment, please call [phone] to reschedule.     If you have any questions after you are discharged if it is during business hours you can call one of the family medicine discharge nurse at [phone].  After hours,  you can call [phone] and ask to speak to the [facility] Medicine doctor on call.     SCHEDULE FOLLOW-UP FAMILY MEDICINE (ACUTE  CARE/HOSPITAL DISCHARGE) - PHYSICIAN OFFICE CENTER     Follow-up in: 3 DAYS    Reason for visit: [place], MD 09/17/2021  Melina Fiddler, PGY-1        Copies sent to Care Team         Relationship Specialty Notifications Start End    Dola Argyle, MD PCP - General FAMILY MEDICINE Admissions 10/14/20     Phone: [phone] Fax: [phone]         [place] [facility] [address]            Referring providers can utilize [email or web address] to access their referred [place] patient's information.

## 2021-09-17 NOTE — Progress Notes (Signed)
Placed OR request for repeat ERCP w/ stent removal in 8 weeks.     Marguarite Arbour, APRN, NP-C  [facility] Medicine  Gastroenterology and Hepatology  Advanced Endoscopy   09/17/21  3:21 PM

## 2021-09-17 NOTE — Ancillary Notes (Cosign Needed)
ERCP Procedure Date: 09/16/2021  Patient Name: Henry [name], Henry [name]  Date of Birth: February 05, 1956  Admit Type: Inpatient  Room: [facility] OR 2W 220  Gender: Male  MRN: [id number]  Age: 66  Account Number: [id number]  Attending MD: Nelle Don , MD  Procedure:       ERCP  Providers:       Nelle Don, MD, Rayna Sexton, MD (Fellow)  Indications:       Bile duct stone(s), Elevated liver enzymes  Patient Profile:       This is a 66 year old male. Refer to note in patient chart for       documentation of history and physical.  Medicines:       General Anesthesia  Procedure:       Pre-Anesthesia Assessment:       - Prior to the procedure, a History and Physical was performed, and       patient medications and allergies were reviewed. The patient is       competent. The risks and benefits of the procedure and the sedation       options and risks were discussed with the patient. All questions were       answered and informed consent was obtained. Patient identification and       proposed procedure were verified by the physician, the nurse, the       anesthesiologist, the anesthetist and the technician in the       pre-procedure area in the procedure room in the endoscopy suite. Mental       Status Examination: alert and oriented. Airway Examination: normal       oropharyngeal airway and neck mobility. Respiratory Examination: clear       to auscultation. CV Examination: normal. Prophylactic Antibiotics: The       patient does not require prophylactic antibiotics. Prior Anticoagulants:       The patient has taken no anticoagulant or antiplatelet agents. ASA Grade       Assessment: III - A patient with severe systemic disease. After       reviewing the risks and benefits, the patient was deemed in satisfactory       condition to undergo the procedure. The anesthesia plan was to use       general anesthesia. Immediately prior to administration of medications,       the patient was re-assessed for adequacy to receive sedatives.  The heart       rate, respiratory rate, oxygen saturations, blood pressure, adequacy of       pulmonary ventilation, and response to care were monitored throughout       the procedure. The physical status of the patient was re-assessed after       the procedure.       After obtaining informed consent, the scope was passed under direct       vision. Throughout the procedure, the patient's blood pressure, pulse,       and oxygen saturations were monitored continuously. The endoscope was       introduced through the mouth, and advanced to the duodenum and used to       cannulate the bile duct. The ERCP was accomplished without difficulty.       The patient tolerated the procedure well.  Findings:       The scout film was normal. The esophagus was successfully intubated       under direct vision. The scope was  advanced to a normal major papilla in       the descending duodenum without detailed examination of the pharynx,       larynx and associated structures, and upper GI tract. The upper GI tract       was grossly normal. Pus was emerging from the major papilla. The bile       duct was deeply cannulated with the short-nosed traction sphincterotome.       Contrast was injected. I personally interpreted the bile duct images.       There was brisk flow of contrast through the ducts. Image quality was       adequate. Contrast extended to the entire biliary tree. The common bile       duct, common hepatic duct and left and right hepatic ducts and all       intrahepatic branches were normal. Opacification of the cystic duct       failed. A 0.025 inch x 270 cm straight Visiglide wire was passed into       the biliary tree. A 7 mm biliary sphincterotomy was made with a       monofilament Autotome sphincterotome using ERBE electrocautery. There       was no post-sphincterotomy bleeding. To discover objects, the biliary       tree was swept with a 12 mm balloon starting at the bifurcation. Pus and       sludge was swept from  the duct. One 10 Fr by 9 cm plastic stent with a       single external flap and a single internal flap was placed 8.5 cm into       the common bile duct. Bile flowed through the stent. The stent was in       good position. There was no inadvertent cannulation or injection of the       ventral pancreatic duct.  Estimated Blood Loss:       Estimated blood loss: none.  Complications:       No immediate complications.  Impression:       - Pus was seen in the major papilla.       - A biliary sphincterotomy was performed.       - The biliary tree was swept and pus along with sludge was found. It is       possible that the passed the stone spontaneously.       - A 10 Fr by 9 cm plastic stent was placed due to findings of       cholangitis.  Recommendation:       - Return patient to hospital ward for ongoing care.       - Advance diet as tolerated.       - Please continue with broad spectrum antibiotics given findings of       cholangitis.       - Please hold anticoagulation for 48 hrs.       - Repeat ERCP in 8 weeks to remove stent.       - Patient has a contact number available for emergencies. The signs and       symptoms of potential delayed complications were discussed with the       patient. Return to normal activities tomorrow. Written discharge       instructions were provided to the patient.  Procedure Code(s):       --- Professional ---  74081, Endoscopic retrograde cholangiopancreatography (ERCP); with       placement of endoscopic stent into biliary or pancreatic duct, including       pre- and post-dilation and guide wire passage, when performed, including       sphincterotomy, when performed, each stent       44818, Endoscopic catheterization of the biliary ductal system,       radiological supervision and interpretation       --- Technical ---       [phone], Endoscopic retrograde cholangiopancreatography (ERCP); with       placement of endoscopic stent into biliary or pancreatic duct, including       pre-  and post-dilation and guide wire passage, when performed, including       sphincterotomy, when performed, each stent       97026, Endoscopic catheterization of the biliary ductal system,       radiological supervision and interpretation  Diagnosis Code(s):       --- Professional ---       K80.50, Calculus of bile duct without cholangitis or cholecystitis       without obstruction       R74.8, Abnormal levels of other serum enzymes       K83.8, Other specified diseases of biliary tract       --- Technical ---       K80.50, Calculus of bile duct without cholangitis or cholecystitis       without obstruction       R74.8, Abnormal levels of other serum enzymes       K83.8, Other specified diseases of biliary tract  CPT copyright 2020 American Medical Association. All rights reserved.  The codes documented in this report are preliminary and upon coder review may  be revised to meet current compliance requirements.  Nelle Don, MD  Nelle Don, MD  09/16/2021 5:34:54 PM  Rohit Darrall Dears, MD  Number of Addenda: 0  Attending Participation:       I was present and participated during the entire procedure, including       non-key portions.  Scope In:  Scope Out:       [address] [place] [address]

## 2021-09-17 NOTE — Care Management Notes (Signed)
[facility] Management Initial Evaluation    Patient Name: Henry [name] [facility]  Date of Birth: 1955/03/31  Sex: male  Date/Time of Admission: 09/15/2021 10:53 PM  Room/Bed: 18/A  Payor: MEDICARE / Plan: MEDICARE PART A AND B / Product Type: Medicare /   Primary Care Providers:  Dola Argyle, MD, MD (General)    Pharmacy Info:   Preferred Pharmacy       [id number] [address] [place] [address]    [address] [id number]   Phone: [phone]: [phone]   Hours: Not open 24 hours          Emergency Contact Info:   Extended Emergency Contact Information  Primary [facility] Contact: [facility] Address: [address] [place] [address] [place] Work Phone: [phone] Mobile Phone: [phone] Relation: Wife    History:   DHershy Flenneris a 66y.o., male, admitted for abdominal pain    Height/Weight: 175.3 cm (5' 9" ) / 126 kg (278 lb 3.5 oz)     LOS: 0 days   Admitting Diagnosis: Abdominal pain [R10.9]    Assessment:      09/17/21 1340   Assessment Details   Assessment Type Admission   Date of Care Management Update 09/17/21   Date of Next DCP Update 09/19/21   Readmission   Is this a readmission? No   Insurance Information/Type   Insurance type Medicare   Employment/Financial   Patient has Prescription Coverage?  Yes        Name of Insurance Coverage for Medications Medicare, AARP   Financial Concerns none   Living Environment   Select an age group to open "lives with" row.  Adult   Lives With spouse   Living Arrangements house   Able to Return to Prior Arrangements yes   Home Safety   Home Assessment: No Problems Identified   Home Accessibility stairs to enter home   Custody and Legal Status   Do you have a court appointed guardian/conservator? No   Care Management Plan   Discharge Planning Status initial meeting   Projected Discharge Date 09/17/21   Discharge plan discussed with: Patient   CM will evaluate for  rehabilitation potential yes   Patient aware of possible cost for ambulance transport?  No   Discharge Needs Assessment   Equipment Currently Used at Home cpap   Equipment Needed After Discharge none   Discharge Facility/Level of Care Needs Home (Patient/Family Member/other)(code 1)   Transportation Available family or friend will provide   Referral Information   Admission Type inpatient   Address Verified verified-no changes   Arrived From home or self-care   ADVANCE DIRECTIVES   Does the Patient have an Advance Directive? Yes, Patient Does Have Advance Directive for Healthcare Treatment   Type of Advance Directive Completed Medical Living Will;Medical Power of Attorney   Copy of Advance Directives in Chart? Yes, Copy on Chart.(Specify in [place]   Name of MPOA or Healthcare Surrogate Tammy HKamari, [place]  Phone Number of MPOA or Healthcare Surrogate [phone]  Patient Requests Assistance in Having Advance Directive Notarized. N/A   LAY CAREGIVER    Appointed Lay Caregiver? I Decline   Home Main Entrance   Stair Railings, Main Entrance none   Number of Stairs, Main Entrance two  MSW met with pt and wife at bedside to complete initial assessment.  Pt lives at home with his wife, is not currently active with any HH, uses a CPAP, and sees his PCP every 6 months.  Pt admitted with abdominal pain, is s/p ERCP.  GI planning for a cholecystectomy outpatient.  Anticipate pt to DC home with no needs when medically stable.  Will continue to follow.    Discharge Plan:  Home (Patient/Family Member/other) (code 1)      The patient will continue to be evaluated for developing discharge needs.     Case Manager: Elwyn Reach, [place]  Phone: [phone]

## 2021-09-17 NOTE — Progress Notes (Signed)
Department of Family Medicine  Inpatient Daily Progress Note     Henry [name], Henry [name] 66 y.o. male  Date of Birth:  1955/10/25  Date of Admission:  09/15/2021      Date of service: 09/17/2021  CC: Abdominal pain    SUBJECTIVE  Overnight: NAEON.    This morning: Henry [name] is feeling well this morning. Denies any abdominal pain. Updated on plan to schedule appt with Dr. Odis Hollingshead for cholecystectomy. No questions or concerns at this time.    Denies chest pain, palpitations, dyspnea, fevers, chills.     ROS: As discussed above in the HPI; otherwise all other systems negative.    Current Medications:   atorvastatin (LIPITOR) tablet 40 mg QPM    dicyclomine (BENTYL) capsule 20 mg 4x/day    enoxaparin PF (LOVENOX) 40 mg/0.4 mL SubQ injection 40 mg Q24H    hydroCHLOROthiazide (HYDRODIURIL) tablet 25 mg Daily    lactobacillus rhamnosus (CULTURELLE) active cultures capsule 1 Capsule 2x/day-Food    losartan (COZAAR) tablet 100 mg Daily    metoprolol tartrate (LOPRESSOR) tablet 50 mg 2x/day    NS flush syringe 2-6 mL Q8HRS    NS flush syringe 2-6 mL Q8HRS    pantoprazole (PROTONIX) delayed release tablet 40 mg Daily    piperacillin-tazobactam (ZOSYN) 4.5 g in NS 100 mL IVPB 4.5 g Q8H    potassium chloride (K-DUR) extended release tablet 20 mEq Once     OBJECTIVE  Vital Signs:  Patient Vitals for the past 24 hrs:   BP Temp Pulse Resp SpO2   09/17/21 0737 (!) 144/88 36.5 C (97.7 F) 62 16 95 %   09/17/21 0300 (!) 141/74 36.8 C (98.2 F) 67 17 93 %   09/16/21 2300 114/68 36.8 C (98.2 F) 67 20 92 %   09/16/21 1900 124/64 36.7 C (98 F) 72 16 94 %   09/16/21 1545 (!) 115/53 -- 77 (!) 23 92 %   09/16/21 1530 (!) 117/49 37.4 C (99.3 F) 80 20 99 %   09/16/21 1517 (!) 116/49 37.6 C (99.7 F) 78 (!) 21 94 %   09/16/21 1257 134/61 37.2 C (99 F) 84 18 96 %   09/16/21 1100 133/69 37 C (98.6 F) 84 20 95 %       I/O:  Date 09/16/21 0700 - 09/17/21 0659 09/17/21 0700 - 09/18/21 0659   Shift 0700-1459 1500-2259 2300-0659 24 Hour Total  0700-1459 1500-2259 2300-0659 24 Hour Total   INTAKE   P.O.  440  440 240   240     Oral  440  440 240   240   I.V.(mL/kg/hr)  700(0.69)  700(0.23)         Volume Infused (LR premix infusion)  700  700       IV Piggyback 200 100  300         Volume (magnesium sulfate 4 G in SW 100 mL premix IVPB) 100   100         Volume (piperacillin-tazobactam (ZOSYN) 4.5 g in NS 100 mL IVPB) 100   100         Volume (piperacillin-tazobactam (ZOSYN) 4.5 g in NS 100 mL IVPB)  100  100       Shift Total(mL/kg) 200(1.58) 1240(9.83)  1440(11.41) 240(1.9)   240(1.9)   OUTPUT   Urine(mL/kg/hr)             Urine Occurrence 1 x 1 x  2 x 1 x   1 x  Stool             Stool Occurrence     1 x   1 x   Shift Total(mL/kg)           Weight (kg) 126.2 126.2 126.2 126.2 126.2 126.2 126.2 126.2       BM: Date of Last Bowel Movement: 09/16/21    Physical Exam:  General: appears in good health and no distress  Eyes: Conjunctiva clear., Pupils equal and round. , Sclera non-icteric.   HENT:ENMT without erythema or injection, mucous membranes moist.  Neck: supple, symmetrical, trachea midline and Neck ROM:  WNL  Lungs: Clear to auscultation bilaterally.   Cardiovascular: regular rate and rhythm, S1, S2 normal, no murmur, click, rub or gallop  Abdomen: Soft, non-tender, Bowel sounds normal, non-distended  Extremities: No cyanosis or edema  Skin: Skin warm and dry and No rashes  Neurologic: Grossly normal  Psychiatric: Normal    Labs: I have personally reviewed laboratory values.  Results for orders placed or performed during the hospital encounter of 09/15/21 (from the past 24 hour(s))   CBC/DIFF - AM ONCE    Narrative    The following orders were created for panel order CBC/DIFF - AM ONCE.  Procedure                               Abnormality         Status                     ---------                               -----------         ------                     CBC WITH DIFF[[id number]]                Abnormal            Final result                  Please view results for these tests on the individual orders.   BASIC METABOLIC PANEL   Result Value Ref Range    SODIUM 136 136 - 145 mmol/L    POTASSIUM 3.7 3.5 - 5.1 mmol/L    CHLORIDE 101 96 - 111 mmol/L    CO2 TOTAL 25 23 - 31 mmol/L    ANION GAP 10 4 - 13 mmol/L    CALCIUM 8.2 (L) 8.8 - 10.2 mg/dL    GLUCOSE 154 (H) 65 - 125 mg/dL    BUN 18 8 - 25 mg/dL    CREATININE 0.89 0.75 - 1.35 mg/dL    BUN/CREA RATIO 20 6 - 22    ESTIMATED GFR >90 >=60 mL/min/BSA   PHOSPHORUS   Result Value Ref Range    PHOSPHORUS 3.1 2.3 - 4.0 mg/dL   MAGNESIUM   Result Value Ref Range    MAGNESIUM 2.5 1.8 - 2.6 mg/dL   HEPATIC FUNCTION PANEL - AM ONCE   Result Value Ref Range    ALBUMIN 3.2 (L) 3.4 - 4.8 g/dL     ALKALINE PHOSPHATASE 107 45 - 115 U/L    ALT (SGPT) 486 (H) 10 - 55 U/L    AST (SGOT)  207 (H) 8 -  45 U/L    BILIRUBIN TOTAL 5.3 (H) 0.3 - 1.3 mg/dL    BILIRUBIN DIRECT 4.1 (H) 0.1 - 0.4 mg/dL    PROTEIN TOTAL 6.1 6.0 - 8.0 g/dL   CBC WITH DIFF   Result Value Ref Range    WBC 11.4 (H) 3.7 - 11.0 x10^3/uL    RBC 4.54 4.50 - 6.10 x10^6/uL    HGB 13.0 (L) 13.4 - 17.5 g/dL    HCT 37.7 (L) 38.9 - 52.0 %    MCV 83.0 78.0 - 100.0 fL    MCH 28.6 26.0 - 32.0 pg    MCHC 34.5 31.0 - 35.5 g/dL    RDW-CV 13.5 11.5 - 15.5 %    PLATELETS 193 150 - 400 x10^3/uL    MPV 9.1 8.7 - 12.5 fL    NEUTROPHIL % 87 %    LYMPHOCYTE % 6 %    MONOCYTE % 6 %    EOSINOPHIL % 1 %    BASOPHIL % 0 %    NEUTROPHIL # 9.95 (H) 1.50 - 7.70 x10^3/uL    LYMPHOCYTE # 0.69 (L) 1.00 - 4.80 x10^3/uL    MONOCYTE # 0.65 0.20 - 1.10 x10^3/uL    EOSINOPHIL # <0.10 <=0.50 x10^3/uL    BASOPHIL # <0.10 <=0.20 x10^3/uL    IMMATURE GRANULOCYTE % 0 0 - 1 %    IMMATURE GRANULOCYTE # <0.10 <0.10 x10^3/uL      Radiology Results: I have personally reviewed imaging and imaging reports.  Results for orders placed or performed during the hospital encounter of 09/15/21 (from the past 72 hour(s))   [place] RT UPPER QUADRANT     Status: None    Narrative    Henry [name]  [place] RT UPPER  QUADRANT performed on 09/15/2021 11:45 PM    INDICATION: 66 years old Male; epigastric abdominal pain, elevated liver enzymes    TECHNIQUE: Transabdominal ultrasound of the right upper quadrant supplemented with color Doppler.    COMPARISON: CT abdomen pelvis 12/23/2015    FINDINGS:     PANCREAS: Pancreatic head and body is homogeneous in appearance. No pancreatic duct dilation. Pancreatic tail is obscured by overlying bowel gas.    LIVER: Liver contour is within normal limits. Suboptimal assessment of echotexture due to acoustic windows, which is grossly within normal limits. Liver size measures 20.4 cm in long axis of the right hepatic lobe. There is appropriate direction of blood flow in the portal vein and hepatic veins. The echogenicity is increased. No focal hepatic lesion identified within study limitations. Focal areas of decreased echogenicity adjacent to the gallbladder could represent focal fatty sparing.    GALLBLADDER: Gallbladder is of normal size with normal wall thickness. No intraluminal gallstones. No pericholecystic free fluid.    CBD: The common bile duct measures up to 12 mm.    RIGHT KIDNEY: The right kidney measures 13.1 x 4.7 cm. Color Doppler confirms blood flow to the right kidney. Linear echogenic focus in the upper pole the right kidney measures up to 6 mm, there is no associated twinkle artifact. No hydronephrosis.      Impression    1.Hepatomegaly and diffuse hepatic steatosis. Areas of decreased echogenicity adjacent to the gallbladder may represent focal fatty sparing.  2.Dilated common bile duct, not definitively appreciated on the prior. No evidence of intrahepatic bile duct dilation. Consider MRCP for further assessment.  3.Suspected nonobstructing 6 mm calculus in the upper pole the right kidney.   CT ABDOMEN PELVIS W IV CONTRAST  Status: None    Narrative    Henry [name]  Male, 66 years old.    CT ABDOMEN PELVIS W IV CONTRAST performed on 09/16/2021 1:22 AM.    REASON FOR  EXAM:  severe diffuse abd pain    RADIATION DOSE: 1053.36 mGy.cm    CONTRAST: 100 ml's of Isovue 370    TECHNIQUE: CT abdomen pelvis with IV contrast    COMPARISON: MRI prostate 02/25/2016 and CT abdomen pelvis 12/23/2015    FINDINGS:     LUNGS: Included lung bases are clear.    ABDOMEN/PELVIS:    LIVER: Liver is normal in size and homogenously attenuated.    BILIARY SYSTEM/GALLBLADDER: Gallbladder is normal in size. No evidence of pericholecystic fluid or wall thickening. No intrahepatic bile duct dilation. The common bile duct measures up to 1.0 cm without obstructing lesion appreciated.    PANCREAS: Pancreas is uniform in appearance without duct dilation.    SPLEEN: Spleen is normal in size.    ADRENALS: Bilateral adrenals are normal in size.    KIDNEYS/URETERS/BLADDER: Bilateral kidneys are normal in size. No hydronephrosis. No abnormal ureteral calculi or dilatation. The bladder is mostly decompressed.    REPRODUCTIVE ORGANS:Limited evaluation secondary to streak artifact from adjacent hip prosthesis.    BOWEL: Stomach is mostly decompressed. No abnormal bowel dilatation to suggest obstruction. Diverticulosis without acute diverticulitis    VASCULATURE/LYMPH NODES: Aortoiliac vasculature is of normal caliber. No mesenteric lymphadenopathy by size criteria.    PERITONEUM/RETROPERITONEUM: No free air or fluid.    BONES/SOFT TISSUES: No acute soft tissue or osseous abnormality. 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      Impression    Common bile duct dilation without obstructing lesion appreciated. No intrahepatic bile duct dilation. No CT evidence of acute cholecystitis.         MRI MRCP WO CONTRAST     Status: None    Narrative    Henry [name]  Male, 66 years old.    MRI MRCP WO CONTRAST performed on 09/16/2021 5:03 AM.    REASON FOR EXAM:  abdominal pain, common bile duct dilation    INTRAVENOUS CONTRAST: 0 ml's     COMPARISON: CT 09/16/2021 and right  upper quadrant ultrasound 09/15/2021      FINDINGS: Due to moderate motion due to patient's sleeping during exam contrast was not given.  LIVER:Liver is enlarged with redemonstration of diffuse fatty infiltrate of the liver with mild sparing near the gallbladder fossa. No focal liver lesions.    BILIARY DUCTS:Redemonstration of mild extrahepatic biliary dilatation measuring up to 10 mm involving the common hepatic duct and 8 mm involving the common bile duct. There is subtle outpouching of the distal common bile duct however measurements are 8 mm unchanged from more upstream dilatation. Abrupt caliber change at the ampulla is noted. There is mild T2 dark biliary wall thickening series 3, image 17 involving the common bile duct. Minimal intrahepatic biliary dilatation is seen.    GALLBLADDER:Gallbladder is mildly distended without evidence of wall thickening or definite filling defects.    PANCREAS:Mild to moderate pancreatic atrophy without ductal dilatation or pancreas divisum.    KIDNEY/PROXIMAL URETERS:Mild bilateral perinephric stranding is noted. T2 bright cyst is seen in the lower pole right kidney. No hydronephrosis or suspicious soft tissue renal mass.    ADRENAL:No adrenal nodules.    SPLEEN:Spleen is borderline enlarged measuring 13.2 cm in length.    PERITONEAL CAVITY:No free  fluid in the abdomen.    LYMPH NODES:No enlarged lymph nodes.        Impression    1.Redemonstration of mild intra and mild to moderate extrahepatic biliary dilatation. No filling defect is seen within the common bile duct. There is suggestion of mild common bile duct wall thickening. Findings may represent cholangitis in the appropriate clinical setting. Mild stricture at the level of the ampulla is also a consideration.  2.Hepatomegaly with hepatic steatosis.   FLUORO ERCP     Status: None    Narrative    *Procedure not read by radiology.    *Please Refer to Procedure Note for result.     Microbiology: No results found for any  visits on 09/15/21 (from the past 96 hour(s)).     ASSESSMENT/PLAN:   66 y.o. male with past medical history of prostate cancer, DVT, GERD, HTN, HLD, IBS, osteoarthritis, OSA presenting with sudden onset abdominal pain starting on the evening of 7/31.  LFTs elevated and up trending. Imaging concerning for common bile duct dilation and 6 mm stone in upper pole of right kidney. Presentation, imaging, and labs most concerning for choledocholithiasis.    1. Choledocholithiasis  - ERCP (8/2): No stone, sludge and pus, likely cholangitis  - AM WBC 11.4 up from 6.3   - AM BMP WNL  - AST down to 207 from 586, ALT down to 486 from 516  - RUQ U/S (7/31):  Hepatomegaly and diffuse hepatic steatosis, dilated common bile duct, consider MRCP for further assessment, nonobstructing 6 mm calculus in upper pole of right kidney  - CT AP (8/1):  Common bile duct dilation without obstructing lesion appreciated, no intrahepatic bile duct dilation, no evidence of acute cholecystitis  - MRCP (8/1) w/o contrast d/t falling asleep during exam s/p benzo for claustrophobia  - s/p morphine, fentanyl, ketamine for pain in the ED, currently denies any pain    Plan  - Zofran p.r.n. and Compazine for nausea  - monitor pain and order regimen as appropriate  - GI consulted  - ERCP concerning for cholangitis  - 7 days of abx  - schedule for cholecystectomy   - d/c with plan to f/u with Dr. Odis Holl[facility]head at [facility]  - mIV D5 1/2 NS  - Zosyn for ppx, transition to Augmentin     2. Hypomagnesemia & Hypophosphatemia  - normalized this AM    Plan  - Monitor    Chronic Medical Conditions  HLD: Continue Lipitor 40 mg q.h.s.  HTN: Continue hydrochlorothiazide 25 mg daily, losartan 100 mg daily, and Lopressor 50 mg b.i.d.  IBS:  Continue home Bentyl 20 mg q.i.d.  GERD:  Protonix in place of home omeprazole    Status: Floor    Nutrition: NPO  Fluids: D5 1/2 NS  GI prophylaxis: Protonix  DVT/PE: Enoxaparin    PT/OT: n/i  Disposition: Likely home     Gaston Islam,  MD 09/17/2021  Transitional Year, PGY-1       I saw and examined the patient.  I reviewed the resident's note.  I agree with the findings and plan of care as documented in the resident's note.  Any exceptions/additions are edited/noted.    Beverlyn Roux, MD

## 2021-09-18 ENCOUNTER — Telehealth (HOSPITAL_COMMUNITY): Payer: Self-pay | Admitting: Student in an Organized Health Care Education/Training Program

## 2021-09-18 NOTE — Telephone Encounter (Signed)
Transition of Care Contact Information  Discharge Date: 09/17/2021  Transition Facility Type--Hospital (Inpatient or Observation)  [facility]  Interactive Contact(s): Completed or attempted contact indicated by Date/Time  Completed Contact: 09/18/2021 12:14 PM  Contact Method(s)-- Patient/Caregiver Telephone, MyChart Patient Portal  Clinical Staff Name/Role who contacted--Yago Ludvigsen Nancie Neas  Transition Assessment  Discharge Summary obtained?--Yes  How are you recovering?--Improving  Discharge Meds obtained?--Yes  Medications reconcilled with Discharge Documentation?--Yes  Medication understanding --knows new medication(s)--knows purpose of medication  Medication Concerns?--No  Have everything needed for recovery?--Yes  Care Coordination:   Patient has transition follow-up appointment date and time?--Yes  Primary Care Transition Visit planned?--Yes  Specialist Transition Visit planned?--Yes  Patient/caregiver plans to attend transition visit?--Yes  Primary Follow-up Barrier  Interventions provided --reinforced discharge instructions--follow-up appointment date/time reinforced  Home Health or DME ordered at discharge?--No  Clinician/Team notified?--No  Primary reason clinician notified?  Transition Note:Transition of Care Follow-Up Telephone Encounter    Plan:      Patient requests transportation referral for follow up appointment? No    Instructions:     Reviewed After Visit [place] (AVS), Reviewed discharge instructions as outlined on AVS. Yes  Agrees to contact PCP for non-acute symptoms during the hours of 8 a.m. - 4 p.m., PCP number provided/reinforced. Yes  3.   Nurse Navigators are available 24/7 at [phone] for questions and concerns.  Number provided to patient. Yes    Referrals to population health? No    Additional information/assessment:      PCP: Dola Argyle, MD    Shirlyn Goltz, RN

## 2021-09-19 NOTE — Progress Notes (Signed)
Patient sent message via MyChart that he has everything he needs.  Fermin Schwab, RN

## 2021-09-22 ENCOUNTER — Ambulatory Visit: Payer: Medicare Other | Attending: Family Medicine | Admitting: FAMILY MEDICINE

## 2021-09-22 ENCOUNTER — Other Ambulatory Visit: Payer: Self-pay

## 2021-09-22 VITALS — BP 139/70 | HR 67 | Temp 97.5°F | Resp 16 | Ht 69.02 in | Wt 273.6 lb

## 2021-09-22 DIAGNOSIS — Z09 Encounter for follow-up examination after completed treatment for conditions other than malignant neoplasm: Secondary | ICD-10-CM

## 2021-09-22 DIAGNOSIS — K8309 Other cholangitis: Secondary | ICD-10-CM

## 2021-09-22 NOTE — Progress Notes (Signed)
I have seen and evaluated the patient. I have discussed the patient's case with the resident and I agree with the assessment and plan.    Anaiz Qazi E. Faigy Stretch, MD

## 2021-09-22 NOTE — Progress Notes (Signed)
FAMILY MEDICINE, PHYSICIAN OFFICE CENTER  [place] [id number]  Operated by [facility]  Transition of Care Management Note    Henry [name]  [id number]  03/18/1955  Date of Encounter: 09/22/2021     Transition of Care Contact Information  Discharge date: Discharge Date: 09/17/2021  Transition Facility Type--Hospital (Inpatient or Observation)  [facility] Interactive Contact(s):  Completed Contact: 09/18/2021 12:14 PM  Contact Method(s)-- Patient/Caregiver Telephone, MyChart Patient Portal  Clinical Staff Name/Role who contacted--Jennifer Nancie Neas     Data Reviewed  Medication Reconciliation completed      Chief Complaint   Patient presents with    Hospital Discharge Transition     Tire easily, stomach ok-seems sore, some cramping and gas.    Hospital Discharge Transition        HPI  Henry [name] is a 66 y.o. male with PMH of prostate cancer, DVT, GERD, HTN, HLD, IBS, osteoarthritis, OSA presenting today for follow-up after being discharged. Patient admitted for cholangitis and underwent ERCP.  Since admission patient has improvement in symptoms. The abdominal pain has nearly resolved. Just some mild abdominal cramping. Denies any chest pain, dyspnea, N/V/D. Is tolerating the Augmentin antibiotic. Will establish with Dr. Marcie Mowers general for evaluation on cholecystectomy on Aug. 16th.     Medication Reconciliation was performed today.    Medical and Surgical History reviewed with patient today; any changes have been updated in the EMR.     REVIEW OF SYSTEMS: As discussed above in the HPI; otherwise all other systems negative.    PHYSICAL EXAM:  GEN:   NAD  HEENT:   Normocephalic; atraumatic.  PULM:   Lung sounds clear to auscultation bilaterally.  Normal respiratory effort.    CV:   Regular rate and rhythm.  ABD:   Abdomen soft, nontender, and nondistended.   MS:   Atraumatic.  Distal pulses intact.  Normal strength and range of motion of  all extremities.    NEURO:   Alert and oriented to person, place and time.    PSYCHOSOCIAL:   Pleasant.  Normal affect.      LABS/IMAGING  I personally reviewed the labs and imaging obtained during admission and discussed with patient.  Lab Results   Component Value Date    WBC 11.4 (H) 09/17/2021    HGB 13.0 (L) 09/17/2021    SODIUM 136 09/17/2021    CREATININE 0.89 09/17/2021    POTASSIUM 3.7 09/17/2021     Most Recent EKG Results in Last 30 Days   ECG 12-LEAD    Collection Time: 09/15/21  9:06 PM   Result Value Ref Range    Ventricular rate 67 BPM    Atrial Rate 67 BPM    PR Interval 208 ms    QRS Duration 96 ms    QT Interval 394 ms    QTC Calculation 416 ms    Calculated P Axis 34 degrees    Calculated R Axis 47 degrees    Calculated T Axis 66 degrees    Narrative    Normal sinus rhythm  Normal ECG  When compared with ECG of 31-Mar-2021 16:28,  No significant change was found  Confirmed by Noralee Stain ([id number]) on 09/16/2021 7:20:01 AM       ASSESSMENT/PLAN  Henry [name] 66 y.o. male seen today for initial visit after discharge.  Final discharge diagnosis: Cholangitis   AMB DC Diagnosis Risk: severe exacerbation requiring hospital admission  -  I have reviewed the patient's discharge summary.   - Medications were reconciled and appropriate medication education was provided.   - Appropriate education about the admission and care coordination was also provided today.  Other transition actions (Optional) -: Discharge documentation was reviewed    Underlying comorbid conditions increasing complexity of patient care include: Hyperlipidemia, HTN, Osteoarthritis.  Changes made to comorbid condition management:  - None    (Z09) Hospital discharge follow-up  (primary encounter diagnosis)    (K83.09) Cholangitis  - s/p ERCP  - Clinically improving   - Will finish 7 day course Augmentin; no indication to further extend course  - Will establish with [facility] surgery for eval on cholecystectomy   - F/u with GI  scheduled     Return if symptoms worsen or fail to improve., prn.    Winferd Humphrey, MD  09/22/2021, 12:40

## 2021-10-17 ENCOUNTER — Telehealth (HOSPITAL_BASED_OUTPATIENT_CLINIC_OR_DEPARTMENT_OTHER): Payer: Self-pay

## 2021-10-17 NOTE — Telephone Encounter (Signed)
Pt has been cleared just checking on pt, pt just recently had his gall bladder removed. Pt states he is feeling a lot better. Pt is  at 272 lbs goal is 275 lbs. Will follow up with pt in about a month

## 2021-10-28 ENCOUNTER — Encounter (HOSPITAL_BASED_OUTPATIENT_CLINIC_OR_DEPARTMENT_OTHER): Payer: Self-pay | Admitting: Nurse Practitioner

## 2021-11-03 NOTE — Progress Notes (Unsigned)
[place]    Patient Name: [name], [place]  MRN#: [id number]  DOB: Nov 15, 1955  Date of Service: 11/04/2021    CC: Sleep Apnea        Last Clinic Visit: 10/29/2020    Sleep Disorders Current Therapy  Positive Pressure: CPAP 9 cm H2O     Mask: Zest nasal      HPI: The patient 66 y.o. with obstructive sleep apnea presenting for routine follow up. He is doing very well with CPAP device. He uses the device nightly and derives great benefit. DME sent him a different kind of filter which is isn't sure goes with his machine. He did try calling [employer] about it, but the person he spoke with didn't know what he was talking about. He also asked DME to take him off of the auto-ship supply renewal which they did. He otherwise has no concerns or complaints to mention today.    Apnea Hypopnea index (diagnostic): ANP 01/23/2010 AHI 53.8 Desat 63%  Titration study 03/03/2010 CPAP 7 cm H2O     Current use of positive pressure therapy: Nightly      Problems with Therapy:    Mask:N/A    Machine: Denies    Nasal: Denies    Epworth Sleepiness Scale: 6        Sleep structure:   - bedtime: 0000, sleep latency: minutes, Frequent night time awakening: minimal, Up: 0500  -Nap: sometimes a short 20 minutes afternoon nap         Review of Systems:   Constitutional: (-) fever (-) chills (-) fatigue (-) unintentional weight loss  Eyes: (-) vision changes (-) itchy or burning eyes  Ears/Nose/Thoat/Mouth: (-) snoring  (-) oral lesions  (-) sore throat (-) nasal congestion  CV: (-) chest pain (-) palpitations (-) edema   Pulm: (-) shortness of breath (-) cough (-) nocturnal gasping  (-) dyspnea on exertion  GI: (-) abl pain (-) nausea (-) vomiting (-) diarrhea (-) constipation  Genitourinary: (-) dysuria (-) nocturia (-) incontinence (-) urgency  Musculoskeletal: (-) joint pain (-) joint swelling (-) muscle pain  Neurologic: (-) memory loss (-) paresthesias (-) dizziness (-) headaches  Skin: (-) rash (-)  itching (-) jaundice  Psych: (-) anxiety (-) depression (-) insomnia   Endocrine: (-) heat intolerance (-) cold intolerance  Heme/Lymph: (-) lymphadenopathy (-) easy bruising  Allergy/Immune: (-) hives (-) seasonal allergies       Past Medical History:   Past Medical History:   Diagnosis Date    Cancer (CMS [place]) 11/2015    prostate cancer    Chronic pain     CPAP (continuous positive airway pressure) dependence     settings unknown    Deep vein thrombosis (DVT) (CMS [facility])     left leg    GERD (gastroesophageal reflux disease)     well controlled on omeprazole    Heartburn     HTN     Hyperlipidemia     Hypertension     Irritable bowel syndrome     Melanoma (CMS [facility])     Neck problem     Osteoarthritis of back     Prostate cancer (CMS [place]) 12/11/2015    Sleep apnea     Wears glasses            Allergies:   Allergies   Allergen Reactions    Sulfa (Sulfonamides) Rash    Shellfish Containing Products Itching and Swelling  Shrimp - eye swelling, itching         Current Medications:   Prior to Admission medications    Medication Sig Start Date End Date Taking? Authorizing Provider   Acetaminophen 500 mg Oral Capsule Take 1 Cap (500 mg total) by mouth Every 6 hours as needed 10/07/18   Ricke Hey, MD   atorvastatin (LIPITOR) 40 mg Oral Tablet Take 1 Tablet (40 mg total) by mouth Once a day 10/14/20   Rickards, Zenia Resides, MD   diclofenac sodium (VOLTAREN) 75 mg Oral Tablet, Delayed Release (E.C.) Take 1 Tablet (75 mg total) by mouth Twice daily 10/14/20   Rickards, Zenia Resides, MD   dicyclomine (BENTYL) 20 mg Oral Tablet Take 1 Tablet (20 mg total) by mouth Four times a day 10/14/20   Rickards, Zenia Resides, MD   GLUCOS CHOND CPLX ADVANCED ORAL Take 2 Tabs by mouth Once a day Glucosamine-Chondroitin 1500 mg    Provider, Historical   hydroCHLOROthiazide (HYDRODIURIL) 25 mg Oral Tablet Take 1 Tablet (25 mg total) by mouth Once a day 07/25/21   Rickards, Zenia Resides, MD   losartan (COZAAR) 100 mg Oral Tablet Take 1 Tablet (100 mg total) by mouth  Once a day 06/05/21   Rickards, Zenia Resides, MD   metoprolol tartrate (LOPRESSOR) 50 mg Oral Tablet Take 1 Tablet (50 mg total) by mouth Twice daily 04/24/21   Rickards, Zenia Resides, MD   metroNIDAZOLE 1 % Gel with Pump by Apply externally route Once a day    Provider, Historical   multivitamin Oral Tablet Take 1 Tablet by mouth Once a day    Provider, Historical   Non-Formulary/Special Preparation (MEDICATION HELP) CBD 2 capsules per day     Provider, Historical   Omeprazole 20 mg Oral Tablet, Delayed Release (E.C.) Take by mouth Once a day    Provider, Historical   triamcinolone acetonide 0.1 % Cream APPLY TWICE DAILY TO ECZEMA UP TO 2 WEEKS/MONTH AS NEEDED. 08/07/21   Provider, Historical         Physical Exam: The patient's current vitals are Pulse 64   Temp 36.3 C (97.4 F)   Resp 18   Ht 1.753 m (5' 9" )   Wt 125 kg (276 lb 0.3 oz)   SpO2 98%   BMI 40.76 kg/m       Constitutional: NAD, pleasant, appears stated age  Eyes: Conjunctiva clear, no drainage noted.   Oropharynx: Oropharynx Mallampati Class IV, dentition good   Neck: 17.5 Inches @ circothyroid membrane and neck supple  Musculoskeletal: Moving all extremities, No observed joint swelling or erythema  Skin: Skin warm and dry without discoloration  Psychiatric: Patient is alert, displaying appropriate behavior and speech       PAP Compliance Report Data Review: 1 month manual download  DME: [employer]  Machine Model: Wynonia Musty II  Setting: CPAP 9 cm of H2O  Average usage ([name]): 5 hours  % of sleep >=4 hours use: 100%  Avg air leak time: 5.4  AHI: 0.7      ASSESSMENT/ PLAN:      1. Obstructive Sleep Apnea  *Good response and benefit of therapy  *Apnea well controlled.   *Continue to do well on positive airway pressure therapy with great subjective and objective compliance.  *Advise obtain replacement equipment every three to six months.   *Advised patient do not drive or operate heavy equipment or machinery if fatigued, drowsy or sleepy due to risk of injury.  *Pt informed  comorbid conditions may worsen if sleep apnea is not  treated adequately.    *Reviewed manufacturer recommended methods for machine and supply maintenance   *Reviewed filter which is disposable one and showed him where to place it and it will need replaced more frequently  *Supplies renewed with DME        RTC: 1 year follow up or prior if patient has issues or problems.          Henry Days, APRN,NP-C 11/04/21 11:18      Henry Downer D. Khloei Spiker, APRN, FNP-C  [facility]  Department of Medicine  Section of Pulmonary and Sleep Medicine

## 2021-11-04 ENCOUNTER — Encounter (HOSPITAL_BASED_OUTPATIENT_CLINIC_OR_DEPARTMENT_OTHER): Payer: Self-pay | Admitting: Nurse Practitioner

## 2021-11-04 ENCOUNTER — Ambulatory Visit: Payer: Medicare Other | Attending: Nurse Practitioner | Admitting: Nurse Practitioner

## 2021-11-04 ENCOUNTER — Other Ambulatory Visit: Payer: Self-pay

## 2021-11-04 VITALS — HR 64 | Temp 97.4°F | Resp 18 | Ht 69.0 in | Wt 276.0 lb

## 2021-11-04 DIAGNOSIS — G4733 Obstructive sleep apnea (adult) (pediatric): Secondary | ICD-10-CM

## 2021-11-04 DIAGNOSIS — Z9989 Dependence on other enabling machines and devices: Secondary | ICD-10-CM

## 2021-11-05 ENCOUNTER — Encounter (HOSPITAL_BASED_OUTPATIENT_CLINIC_OR_DEPARTMENT_OTHER): Payer: Self-pay | Admitting: Nurse Practitioner

## 2021-11-05 NOTE — Progress Notes (Signed)
Ambulatory Care Assistant sent script to DME [employer] for pap supply renewal  via [employer] job number [id number]    PRESSURE CPAP 9  HEATED HUMIDIFICATION  MASK zest nasal  HEADGEAR  FILTERS  TUBING  WATER CHAMBER  CHIN [place], Ambulatory Care Assistant  11/05/2021, 08:28

## 2021-11-06 ENCOUNTER — Inpatient Hospital Stay (HOSPITAL_COMMUNITY)
Admission: RE | Admit: 2021-11-06 | Discharge: 2021-11-06 | Disposition: A | Discharge status: Home / Self Care | Payer: Medicare Other | Source: Ambulatory Visit

## 2021-11-06 ENCOUNTER — Encounter (HOSPITAL_COMMUNITY): Payer: Self-pay

## 2021-11-06 ENCOUNTER — Inpatient Hospital Stay
Admission: RE | Admit: 2021-11-06 | Discharge: 2021-11-06 | Disposition: A | Discharge status: Home / Self Care | Payer: Medicare Other | Source: Ambulatory Visit

## 2021-11-06 ENCOUNTER — Encounter (HOSPITAL_COMMUNITY): Payer: Self-pay | Admitting: Gastroenterology

## 2021-11-06 HISTORY — DX: Tinnitus, unspecified ear: H93.19

## 2021-11-06 HISTORY — DX: Dermatitis, unspecified: L30.9

## 2021-11-06 HISTORY — DX: Rosacea, unspecified: L71.9

## 2021-11-06 NOTE — Anesthesia Preprocedure Evaluation (Signed)
ANESTHESIA PRE-OP EVALUATION  Planned Procedure: ERCP WITH COMMON BILE DUCT STENT REMOVAL (Mouth)  Review of Systems                   Pulmonary   sleep apnea,   Cardiovascular   NYHA Classification:III  Hypertension, hyperlipidemia and DVT , Exercise Tolerance: > or = 4 METS        GI/Hepatic/Renal    GERD        Endo/Other    morbid obesity,      Neuro/Psych/MS    back abnormality     Cancer  CA and prostate cancer,                     Physical Assessment      Plan  ASA 3     Planned anesthesia type: general                                                             Normal sinus rhythm  Normal ECG  When compared with ECG of 31-Mar-2021 16:28,  No significant change was found  Confirmed by Noralee Stain ([id number]) on 09/16/2021 7:20:01 AM        Result Data    Result Status Result Component Value Units   Final result [3] Ventricular rate 67 BPM    Atrial Rate 67 BPM    PR Interval 208 ms    QRS Duration 96 ms    QT Interval 394 ms    QTC Calculation 416 ms    Calculated P Axis 34 degrees    Calculated R Axis 47 degrees    Calculated T Axis 66 Degrees           09/17/2021 Labs in epic    Medication instructions per RN        APP triage call completed

## 2021-11-20 ENCOUNTER — Other Ambulatory Visit: Payer: Self-pay

## 2021-11-20 ENCOUNTER — Ambulatory Visit: Payer: Medicare Other | Attending: Family Medicine

## 2021-11-20 DIAGNOSIS — Z23 Encounter for immunization: Secondary | ICD-10-CM | POA: Insufficient documentation

## 2021-11-20 NOTE — Patient Instructions (Signed)
Vaccine Information Statement    Influenza (Flu) Vaccine (Inactivated or Recombinant): What You Need to Know    Many vaccine information statements are available in Spanish and other languages. See www.immunize.org/vis.  Hojas de informacin sobre vacunas estn disponibles en espaol y en muchos otros idiomas. Visite www.immunize.org/vis.    1. Why get vaccinated?    Influenza vaccine can prevent influenza (flu).    Flu is a contagious disease that spreads around the [place] every year, usually between October and May. Anyone can get the flu, but it is more dangerous for some people. Infants and young children, people 65 years and older, pregnant people, and people with certain health conditions or a weakened immune system are at greatest risk of flu complications.    Pneumonia, bronchitis, sinus infections, and ear infections are examples of flu-related complications. If you have a medical condition, such as heart disease, cancer, or diabetes, flu can make it worse.    Flu can cause fever and chills, sore throat, muscle aches, fatigue, cough, headache, and runny or stuffy nose. Some people may have vomiting and diarrhea, though this is more common in children than adults.     In an average year, thousands of people in the [place] die from flu, and many more are hospitalized. Flu vaccine prevents millions of illnesses and flu-related visits to the doctor each year.    2. Influenza vaccines     CDC recommends everyone 6 months and older get vaccinated every flu season. Children 6 months through 8 years of age may need 2 doses during a single flu season. Everyone else needs only 1 dose each flu season.    It takes about 2 weeks for protection to develop after vaccination.    There are many flu viruses, and they are always changing. Each year a new flu vaccine is made to protect against the influenza viruses believed to be likely to cause disease in the upcoming flu season. Even when the vaccine doesn't  exactly match these viruses, it may still provide some protection.     Influenza vaccine does not cause flu.    Influenza vaccine may be given at the same time as other vaccines.    3. Talk with your health care provider    Tell your vaccination provider if the person getting the vaccine:  . Has had an allergic reaction after a previous dose of influenza vaccine, or has any severe, life-threatening allergies   . Has ever had Guillain-Barr Syndrome (also called "GBS")    In some cases, your health care provider may decide to postpone influenza vaccination until a future visit.    Influenza vaccine can be administered at any time during pregnancy. People who are or will be pregnant during influenza season should receive inactivated influenza vaccine.    People with minor illnesses, such as a cold, may be vaccinated. People who are moderately or severely ill should usually wait until they recover before getting influenza vaccine.    Your health care provider can give you more information.    4. Risks of a vaccine reaction    . Soreness, redness, and swelling where the shot is given, fever, muscle aches, and headache can happen after influenza vaccination.  . There may be a very small increased risk of Guillain-Barr Syndrome (GBS) after inactivated influenza vaccine (the flu shot).    Young children who get the flu shot along with pneumococcal vaccine (PCV13) and/or DTaP vaccine at the same time might be   slightly more likely to have a seizure caused by fever. Tell your health care provider if a child who is getting flu vaccine has ever had a seizure.    People sometimes faint after medical procedures, including vaccination. Tell your provider if you feel dizzy or have vision changes or ringing in the ears.    As with any medicine, there is a very remote chance of a vaccine causing a severe allergic reaction, other serious injury, or death.    5. What if there is a serious problem?    An allergic reaction could occur  after the vaccinated person leaves the clinic. If you see signs of a severe allergic reaction (hives, swelling of the face and throat, difficulty breathing, a fast heartbeat, dizziness, or weakness), call 9-1-1 and get the person to the nearest hospital.    For other signs that concern you, call your health care provider.    Adverse reactions should be reported to the Vaccine Adverse Event Reporting System (VAERS). Your health care provider will usually file this report, or you can do it yourself. Visit the VAERS website at www.vaers.hhs.gov or call 1-800-822-7967. VAERS is only for reporting reactions, and VAERS staff members do not give medical advice.    6. The National Vaccine Injury Compensation Program    The National Vaccine Injury Compensation Program (VICP) is a federal program that was created to compensate people who may have been injured by certain vaccines. Claims regarding alleged injury or death due to vaccination have a time limit for filing, which may be as short as two years. Visit the VICP website at www.hrsa.gov/vaccinecompensation or call 1-800-338-2382 to learn about the program and about filing a claim.     7. How can I learn more?    . Ask your health care provider.   . Call your local or state health department.   . Visit the website of the Food and Drug Administration (FDA) for vaccine package inserts and additional information at www.fda.gov/vaccines-blood-biologics/vaccines.  . Contact the Centers for Disease Control and Prevention (CDC):  - Call 1-800-232-4636 (1-800-CDC-INFO) or  - Visit CDC's influenza website at www.cdc.gov/flu.    Vaccine Information Statement   Inactivated Influenza Vaccine   09/22/2019  42 U.S.C.  [id number]   Department of Health and Human Services  Centers for Disease Control and Prevention    Office Use Only

## 2021-11-20 NOTE — Nursing Note (Signed)
1. Are you 65 years of age or older? yes  2. Have you ever had a severe reaction to a flu shot? no  3. Are you allergic to eggs? no  4. Are you allergic to latex? no  5. Are you allergic to Thimerosol? no  6. Are you experiencing acute illness symptoms or have you been running a fever? no  7. Do you have a medical condition or taking medications that suppress your immune system? no  8. Have you ever had Guillain-Barre syndrome or other neurologic disorder? no  9. Are you pregnant or breastfeeding? no    Immunization administered       Name Date Dose VIS Date Route    Influenza Vaccine, 65+ 11/20/2021 0.5 mL 09/22/2019 Intramuscular    Site: Left deltoid    Given By: Jari Sportsman, MA    Manufacturer: [employer]    Lot: [id number]    NDC: [id number]    VAXNEUVANCE(PCV 15) 11/20/2021 0.5 mL 09/08/2021 Intramuscular    Site: Right deltoid    Given By: Jari Sportsman, MA    Manufacturer: [place]    Lot: [id number]    [place]: [id number]            Jari Sportsman, [place] 11/20/2021, 09:25

## 2021-11-23 ENCOUNTER — Other Ambulatory Visit (HOSPITAL_BASED_OUTPATIENT_CLINIC_OR_DEPARTMENT_OTHER): Payer: Self-pay | Admitting: Student in an Organized Health Care Education/Training Program

## 2021-11-23 DIAGNOSIS — E785 Hyperlipidemia, unspecified: Secondary | ICD-10-CM

## 2021-11-23 DIAGNOSIS — K589 Irritable bowel syndrome without diarrhea: Secondary | ICD-10-CM

## 2021-11-24 ENCOUNTER — Other Ambulatory Visit (HOSPITAL_BASED_OUTPATIENT_CLINIC_OR_DEPARTMENT_OTHER): Payer: Self-pay | Admitting: Student in an Organized Health Care Education/Training Program

## 2021-11-24 DIAGNOSIS — M1990 Unspecified osteoarthritis, unspecified site: Secondary | ICD-10-CM

## 2021-11-27 ENCOUNTER — Other Ambulatory Visit (INDEPENDENT_AMBULATORY_CARE_PROVIDER_SITE_OTHER): Payer: Self-pay | Admitting: Neurological Surgery

## 2021-11-27 DIAGNOSIS — M48061 Spinal stenosis, lumbar region without neurogenic claudication: Secondary | ICD-10-CM

## 2021-12-05 ENCOUNTER — Encounter (INDEPENDENT_AMBULATORY_CARE_PROVIDER_SITE_OTHER): Payer: Self-pay

## 2021-12-05 ENCOUNTER — Telehealth (INDEPENDENT_AMBULATORY_CARE_PROVIDER_SITE_OTHER): Payer: Self-pay | Admitting: Neurological Surgery

## 2021-12-05 NOTE — Telephone Encounter (Signed)
CM left voice message requesting a return phone call to review guidelines. MyChart message sent to PT about Guidelines.    12/05/2021  Danella Sensing, CASE MANAGER

## 2021-12-08 ENCOUNTER — Ambulatory Visit (HOSPITAL_BASED_OUTPATIENT_CLINIC_OR_DEPARTMENT_OTHER): Payer: Self-pay | Admitting: Student in an Organized Health Care Education/Training Program

## 2021-12-09 ENCOUNTER — Inpatient Hospital Stay (HOSPITAL_COMMUNITY): Payer: Medicare Other | Admitting: Radiology

## 2021-12-09 ENCOUNTER — Encounter (HOSPITAL_COMMUNITY)
Admission: RE | Disposition: A | Discharge status: Home / Self Care | Payer: Self-pay | Source: Ambulatory Visit | Attending: Gastroenterology

## 2021-12-09 ENCOUNTER — Other Ambulatory Visit: Payer: Self-pay

## 2021-12-09 ENCOUNTER — Inpatient Hospital Stay
Admission: RE | Admit: 2021-12-09 | Discharge: 2021-12-09 | Disposition: A | Discharge status: Home / Self Care | Payer: Medicare Other | Source: Ambulatory Visit | Attending: Gastroenterology | Admitting: Gastroenterology

## 2021-12-09 ENCOUNTER — Ambulatory Visit (HOSPITAL_BASED_OUTPATIENT_CLINIC_OR_DEPARTMENT_OTHER): Payer: Medicare Other | Admitting: Family

## 2021-12-09 ENCOUNTER — Ambulatory Visit (HOSPITAL_COMMUNITY): Payer: Medicare Other | Admitting: Family

## 2021-12-09 ENCOUNTER — Encounter (HOSPITAL_COMMUNITY): Payer: Self-pay | Admitting: Gastroenterology

## 2021-12-09 DIAGNOSIS — Z9049 Acquired absence of other specified parts of digestive tract: Secondary | ICD-10-CM | POA: Insufficient documentation

## 2021-12-09 DIAGNOSIS — K838 Other specified diseases of biliary tract: Secondary | ICD-10-CM | POA: Insufficient documentation

## 2021-12-09 DIAGNOSIS — Z4659 Encounter for fitting and adjustment of other gastrointestinal appliance and device: Secondary | ICD-10-CM

## 2021-12-09 HISTORY — DX: Unspecified osteoarthritis, unspecified site: M19.90

## 2021-12-09 HISTORY — DX: Dorsopathy, unspecified: M53.9

## 2021-12-09 SURGERY — ERCP WITH COMMON BILE DUCT STENT REMOVAL
Anesthesia: General | Site: Mouth | Wound class: Clean Contaminated Wounds-The respiratory, GI, Genital, or urinary

## 2021-12-09 MED ORDER — SODIUM CHLORIDE 0.9 % (FLUSH) INJECTION SYRINGE
2.0000 mL | INJECTION | Freq: Three times a day (TID) | INTRAMUSCULAR | Status: DC
Start: 2021-12-09 — End: 2021-12-09

## 2021-12-09 MED ORDER — IOPAMIDOL 300 MG IODINE/ML (61 %) INTRAVENOUS SOLUTION
50.0000 mL | INTRAVENOUS | Status: AC
Start: 2021-12-09 — End: 2021-12-09
  Administered 2021-12-09: 8 mL

## 2021-12-09 MED ORDER — SODIUM CHLORIDE 0.9% FLUSH BAG - 250 ML
INTRAVENOUS | Status: DC | PRN
Start: 2021-12-09 — End: 2021-12-09

## 2021-12-09 MED ORDER — DEXAMETHASONE SODIUM PHOSPHATE 4 MG/ML INJECTION SOLUTION
Freq: Once | INTRAMUSCULAR | Status: DC | PRN
Start: 2021-12-09 — End: 2021-12-09
  Administered 2021-12-09: 4 mg via INTRAVENOUS

## 2021-12-09 MED ORDER — LIDOCAINE (PF) 100 MG/5 ML (2 %) INTRAVENOUS SYRINGE
INJECTION | Freq: Once | INTRAVENOUS | Status: DC | PRN
Start: 2021-12-09 — End: 2021-12-09
  Administered 2021-12-09: 100 mg via INTRAVENOUS

## 2021-12-09 MED ORDER — SODIUM CHLORIDE 0.9 % (FLUSH) INJECTION SYRINGE
2.0000 mL | INJECTION | INTRAMUSCULAR | Status: DC | PRN
Start: 2021-12-09 — End: 2021-12-09

## 2021-12-09 MED ORDER — FENTANYL (PF) 50 MCG/ML INJECTION SOLUTION
12.5000 ug | INTRAMUSCULAR | Status: DC | PRN
Start: 2021-12-09 — End: 2021-12-09

## 2021-12-09 MED ORDER — FENTANYL (PF) 50 MCG/ML INJECTION SOLUTION
INTRAMUSCULAR | Status: AC
Start: 2021-12-09 — End: 2021-12-09
  Filled 2021-12-09: qty 2

## 2021-12-09 MED ORDER — FENTANYL (PF) 50 MCG/ML INJECTION SOLUTION
Freq: Once | INTRAMUSCULAR | Status: DC | PRN
Start: 2021-12-09 — End: 2021-12-09
  Administered 2021-12-09: 100 ug via INTRAVENOUS

## 2021-12-09 MED ORDER — SUCCINYLCHOLINE 20 MG/ML INTRAVENOUS WRAPPER
INJECTION | INTRAVENOUS | Status: AC
Start: 2021-12-09 — End: 2021-12-09
  Filled 2021-12-09: qty 10

## 2021-12-09 MED ORDER — SUCCINYLCHOLINE 20 MG/ML INTRAVENOUS WRAPPER
INJECTION | Freq: Once | INTRAVENOUS | Status: DC | PRN
Start: 2021-12-09 — End: 2021-12-09
  Administered 2021-12-09: 200 mg via INTRAVENOUS

## 2021-12-09 MED ORDER — ONDANSETRON HCL (PF) 4 MG/2 ML INJECTION SOLUTION
INTRAMUSCULAR | Status: AC
Start: 2021-12-09 — End: 2021-12-09
  Filled 2021-12-09: qty 2

## 2021-12-09 MED ORDER — PROPOFOL 10 MG/ML IV BOLUS
INJECTION | Freq: Once | INTRAVENOUS | Status: DC | PRN
Start: 2021-12-09 — End: 2021-12-09
  Administered 2021-12-09: 230 mg via INTRAVENOUS

## 2021-12-09 MED ORDER — FENTANYL (PF) 50 MCG/ML INJECTION SOLUTION
25.0000 ug | INTRAMUSCULAR | Status: DC | PRN
Start: 2021-12-09 — End: 2021-12-09

## 2021-12-09 MED ORDER — LACTATED RINGERS INTRAVENOUS SOLUTION
INTRAVENOUS | Status: DC
Start: 2021-12-09 — End: 2021-12-09
  Administered 2021-12-09: 0 mL via INTRAVENOUS

## 2021-12-09 MED ORDER — PHENYLEPHRINE 1 MG/10 ML (100 MCG/ML) IN 0.9 % SOD.CHLORIDE IV SYRINGE
INJECTION | Freq: Once | INTRAVENOUS | Status: DC | PRN
Start: 2021-12-09 — End: 2021-12-09
  Administered 2021-12-09: 100 ug via INTRAVENOUS

## 2021-12-09 MED ORDER — LACTATED RINGERS INTRAVENOUS SOLUTION
INTRAVENOUS | Status: DC
Start: 2021-12-09 — End: 2021-12-09

## 2021-12-09 MED ORDER — PHENYLEPHRINE 1 MG/10 ML (100 MCG/ML) IN 0.9 % SOD.CHLORIDE IV SYRINGE
INJECTION | INTRAVENOUS | Status: AC
Start: 2021-12-09 — End: 2021-12-09
  Filled 2021-12-09: qty 10

## 2021-12-09 MED ORDER — DEXTROSE 5% IN WATER (D5W) FLUSH BAG - 250 ML
INTRAVENOUS | Status: DC | PRN
Start: 2021-12-09 — End: 2021-12-09

## 2021-12-09 MED ORDER — ONDANSETRON HCL (PF) 4 MG/2 ML INJECTION SOLUTION
Freq: Once | INTRAMUSCULAR | Status: DC | PRN
Start: 2021-12-09 — End: 2021-12-09
  Administered 2021-12-09: 4 mg via INTRAVENOUS

## 2021-12-09 MED ORDER — DEXAMETHASONE SODIUM PHOSPHATE 4 MG/ML INJECTION SOLUTION
INTRAMUSCULAR | Status: AC
Start: 2021-12-09 — End: 2021-12-09
  Filled 2021-12-09: qty 1

## 2021-12-09 SURGICAL SUPPLY — 12 items
BALLOON 200CM 6-7FR 9-12MM 3 L_UM ABV INJ SQ SHLDR RTRVL BIL (INSTRUMENTS ENDOMECHANICAL) ×1
BALLOON RTRVL EXTRACT PRO RX 200CM 6-7FR 9-12MM BIL 3 LUM ABV INJ SQ SHLDR ACPT .035IN GW LTX STRL (ENDOSCOPIC SUPPLIES) ×1 IMPLANT
COVER ENDOSCP DIST STRL LF  DISP (ENDOSCOPIC SUPPLIES) ×1 IMPLANT
DEVICE ENDOS RX BIOPSY CAP OLMPS STRL DISP (ENDOSCOPIC SUPPLIES) ×1 IMPLANT
DEVICE LOCK RX BIOPSY CAP SYS_LMPS DISP (INSTRUMENTS ENDOMECHANICAL) ×1
DEVICE SPEC RETR RPTR 2.4MM 230CM HBRD JAW FLXB DIST WRE GRSP LF  DISP (ENDOSCOPIC SUPPLIES) ×1 IMPLANT
DEVICE SPEC RETR RPTR 2.4MM 23_0CM HBRD JAW FLXB DIST WRE (INSTRUMENTS ENDOMECHANICAL) ×1
GW ENDOS .025IN 2700MM 70MM VISIGLIDE 2 HDRPH STRL LF  DISP (ENDOSCOPIC SUPPLIES) ×1 IMPLANT
GW ENDOS .025IN 2700MM 70MM VI_IGLIDE 2 HDRPH STRL LF DISP (INSTRUMENTS ENDOMECHANICAL) ×1
KIT ENDOS CMPLN ENDOKIT ORCAPOD 4 1.1OZ (ENDOSCOPIC SUPPLIES) ×1 IMPLANT
KIT ENDOSCOPIC COMPLIANCE ENDOKIT ORCAPOD 4 1.1 OZ (INSTRUMENTS ENDOMECHANICAL) ×1
SINGLE USE DISTAL SCOPE COVER (INSTRUMENTS ENDOMECHANICAL) ×1

## 2021-12-09 NOTE — Anesthesia Postprocedure Evaluation (Signed)
Anesthesia Post Op Evaluation    Patient: [name]  Procedure(s) with comments:  ERCP WITH COMMON BILE DUCT STENT REMOVAL - cholangitis s/p stent placement  ERCP  8-23    Last Vitals:Temperature: 36 C (96.8 F) (12/09/21 1145)  Heart Rate: 60 (12/09/21 1200)  BP (Non-Invasive): 138/61 (12/09/21 1200)  Respiratory Rate: 12 (12/09/21 1200)  SpO2: 97 % (12/09/21 1200)    No notable events documented.    Patient is sufficiently recovered from the effects of anesthesia to participate in the evaluation and has returned to their pre-procedure level.  Patient location during evaluation: PACU       Patient participation: complete - patient participated  Level of consciousness: awake and alert and responsive to verbal stimuli    Pain management: adequate  Airway patency: patent    Anesthetic complications: no  Cardiovascular status: acceptable  Respiratory status: acceptable  Hydration status: acceptable  Patient post-procedure temperature: Pt Normothermic   PONV Status: Absent

## 2021-12-09 NOTE — Anesthesia Transfer of Care (Signed)
ANESTHESIA TRANSFER OF CARE   Henry [name] is a 66 y.o. ,male, Weight: 124 kg (273 lb 2.4 oz)   had Procedure(s) with comments:  ERCP WITH COMMON BILE DUCT STENT REMOVAL - cholangitis s/p stent placement  ERCP  8-23  performed  12/09/21   Primary Service: Nelle Don, MD    Past Medical History:   Diagnosis Date    Arthritis     Back problem     Cancer (CMS [facility]) 11/2015    prostate cancer, melanoma back neck    Chronic pain     CPAP (continuous positive airway pressure) dependence     CPAP 9 cm H2O    Deep vein thrombosis (DVT) (CMS [facility])     left leg    Eczema     GERD (gastroesophageal reflux disease)     well controlled on omeprazole    Heartburn     HTN     Hyperlipidemia     Hypertension     Irritable bowel syndrome     Melanoma (CMS [place])     Neck problem     obstructive sleep apnea 01/2010    AHI 53.8    Osteoarthritis of back     Prostate cancer (CMS [place]) 12/11/2015    Rosacea     Tinnitus     Wears glasses     Glasses and contacts.      Allergy History as of 12/09/21       SULFA (SULFONAMIDES)         Noted Status Severity Type Reaction    10/25/07 Elmyra Ricks, RN 10/25/07 Active   Rash              SHELLFISH CONTAINING PRODUCTS         Noted Status Severity Type Reaction    05/16/13 1057 Janann Colonel 04/04/13 Active Low  Itching, Swelling    Comments: Shrimp - eye swelling, itching     04/04/13 1402 Gerri Lins, MA 04/04/13 Active       Comments: shrimp                   I completed my transfer of care / handoff to the receiving personnel during which we discussed:  Access, Airway, All key/critical aspects of case discussed, Analgesia, Antibiotics, Expectation of post procedure, Fluids/Product, Gave opportunity for questions and acknowledgement of understanding, Labs and PMHx      Post Location: PACU                        Additional Info:Pt transported to PACU with supplementary oxygen via simple face mask. Report given to RN; all questions answered. VSS, airway patent.                                       Last OR Temp: Temperature: 36.2 C (97.2 F)  ABG:  POTASSIUM   Date Value Ref Range Status   09/17/2021 3.7 3.5 - 5.1 mmol/L Final   05/23/2013 4.5 3.5 - 5.1 mmol/L Final     KETONES   Date Value Ref Range Status   09/15/2021 Negative Negative mg/dL Final     CALCIUM   Date Value Ref Range Status   09/17/2021 8.2 (L) 8.8 - 10.2 mg/dL Final   05/23/2013 8.6 8.5 - 10.4 mg/dL Final     Calculated P Axis   Date  Value Ref Range Status   09/15/2021 34 degrees Final     Calculated R Axis   Date Value Ref Range Status   09/15/2021 47 degrees Final     Calculated T Axis   Date Value Ref Range Status   09/15/2021 66 degrees Final     Airway:* No LDAs found *  Blood pressure 130/79, pulse 67, temperature 36.2 C (97.2 F), resp. rate 15, height 1.766 m (5' 9.53"), weight 124 kg (273 lb 2.4 oz), SpO2 98%.

## 2021-12-09 NOTE — Nurses Notes (Signed)
Patient c/o burning in his chest- Dr. Candiss Norse notified- awaiting MD to come to bedside.

## 2021-12-09 NOTE — H&P (Cosign Needed)
[facility]  GI Admission History and Physical      Patient: Henry [name]   Date of birth: 08/31/1955     Reason for admission: Stent removal     History of Present Illness:  66 y.o M currently admitted for sudden onset abdominal pain associated with nausea. Found to have hepatocellular liver enzyme elevation with hyperbilirubinemia. Imaging concerning for ductal dilation suspected secondary to choledocholithiasis. ERCP completed 08/01 suggestive of cholangitis, no obstructing stone found though pus evacuated from major papilla and biliary tree.  CBD dilated and plastic stent placed. He is here for stent removal.     ERCP   Impression:      - Pus was seen in the major papilla.       - A biliary sphincterotomy was performed.       - The biliary tree was swept and pus along with sludge was found. It is       possible that the passed the stone spontaneously.       - A 10 Fr by 9 cm plastic stent was placed due to findings of       cholangitis     PMH and PSH:   Past Medical History:   Diagnosis Date    Arthritis     Back problem     Cancer (CMS [place]) 11/2015    prostate cancer, melanoma back neck    Chronic pain     CPAP (continuous positive airway pressure) dependence     CPAP 9 cm H2O    Deep vein thrombosis (DVT) (CMS [facility])     left leg    Eczema     GERD (gastroesophageal reflux disease)     well controlled on omeprazole    Heartburn     HTN     Hyperlipidemia     Hypertension     Irritable bowel syndrome     Melanoma (CMS [place])     Neck problem     obstructive sleep apnea 01/2010    AHI 53.8    Osteoarthritis of back     Prostate cancer (CMS [place]) 12/11/2015    Rosacea     Tinnitus     Wears glasses     Glasses and contacts.            Past Surgical History:   Procedure Laterality Date    GALLBLADDER SURGERY      HX ANKLE FRACTURE TX Right     HX APPENDECTOMY      HX HIP REPLACEMENT Right 05/22/2013    Per Dr. Caryl Comes    PB COLONOSCOPY,DIAGNOSTIC             Allergies:   Allergies   Allergen Reactions     Sulfa (Sulfonamides) Rash    Shellfish Containing Products Itching and Swelling     Shrimp - eye swelling, itching        Family history:  Family Medical History:       Problem Relation (Age of Onset)    Asthma Father    COPD Father    Cancer Mother, Father    Coronary Artery Disease Mother    Diabetes Mother    High Cholesterol Mother    Hypertension (High Blood Pressure) Mother    Melanoma Brother    Migraines Mother               Social history:  Social History     Substance and Sexual Activity  Drug Use No    Comment: capsule        Medications:   (Not in an outpatient encounter)       Review Of Systems: Per HPI    OBJECTIVE:  Vitals:  There were no vitals filed for this visit.     Physical Exam:   General: pleasant, alert, in no acute distress, appears stated age  HEENT: EOMI, normal conjunctiva, moist mucous membranes  Heart: RRR, normal S1/S2, no murmurs/rubs/gallops  Lungs: CTAB, no rales/rhonchi/wheezes  Abdomen: soft, nontender, nondistended. Normoactive bowel sounds. No rebound/guarding.   Neuro: A&O X 3, no focal deficit     Labs: Reviewed     Imaging: Reviewed     ASSESSMENT/PLAN:  66 y.o M currently admitted for sudden onset abdominal pain associated with nausea. Found to have hepatocellular liver enzyme elevation with hyperbilirubinemia. Imaging concerning for ductal dilation suspected secondary to choledocholithiasis. ERCP completed 08/01 suggestive of cholangitis, no obstructing stone found though pus evacuated from major papilla and biliary tree.  CBD dilated and plastic stent placed. He is here for stent removal.    Recommendations:  - Proceed with ERCP     D.W Dr. Joneen Boers, MD  PGY-7,  Interventional Gastroenterology and Hepatology fellow

## 2021-12-09 NOTE — Discharge Instructions (Addendum)
SURGICAL DISCHARGE INSTRUCTIONS     Dr. Nelle Don, MD  performed your ERCP WITH COMMON BILE DUCT STENT REMOVAL today at the [facility]:  Monday through Friday from 6 a.m. - 7 p.m.: [phone]  Between 7 p.m. - 6 a.m., weekends and holidays:  Call [facility] at [phone] or [phone].    PLEASE SEE WRITTEN HANDOUTS AS DISCUSSED BY YOUR NURSE    SIGNS AND SYMPTOMS OF A WOUND / INCISION INFECTION   Be sure to watch for the following:  Increase in redness or red streaks near or around the wound or incision.  Increase in pain that is intense or severe and cannot be relieved by the pain medication that your doctor has given you.  Increase in swelling that cannot be relieved by elevation of a body part, or by applying ice, if permitted.  Increase in drainage, or if yellow / green in color and smells bad. This could be on a dressing or a cast.  Increase in fever for longer than 24 hours, or an increase that is higher than 101 degrees Fahrenheit (normal body temperature is 98 degrees Fahrenheit). The incision may feel warm to the touch.    **CALL YOUR DOCTOR IF ONE OR MORE OF THESE SIGNS / SYMPTOMS SHOULD OCCUR.    ANESTHESIA INFORMATION   ANESTHESIA -- ADULT PATIENTS:  You have received intravenous sedation / general anesthesia, and you may feel drowsy and light-headed for several hours. You may even experience some forgetfulness of the procedure. DO NOT DRIVE A MOTOR VEHICLE or perform any activity requiring complete alertness or coordination until you feel fully awake in about 24-48 hours. Do not drink alcoholic beverages for at least 24 hours. Do not stay alone, you must have a responsible adult available to be with you. You may also experience a dry mouth or nausea for 24 hours. This is a normal side effect and will disappear as the effects of the medication wear off.    REMEMBER   If you experience any difficulty breathing, chest pain, bleeding that you feel  is excessive, persistent nausea or vomiting or for any other concerns:  Call your physician Dr. Candiss Norse at [phone] or [phone]. You may also ask to have the doctor on call paged. They are available to you 24 hours a day.    SPECIAL INSTRUCTIONS / COMMENTS   Instructions for Upper Endoscopy:  Patient Information:  No driving until tomorrow, You may experience a sore throat and gas or bloating for 24 hours, Use an antacid for mild discomfort, and Call in one week for biopsy results- Referring MD   REMEMBER TO:  Call your physician for any of the following symptoms:  1.) Persistent vomiting or vomiting bright red blood  2.) Fever (temperature greater than 100F)  3.) Any difficulty breathing.      FOLLOW-UP APPOINTMENTS   Please call patient services at [phone] or [phone] to schedule a date / time of return. They are open Monday - Friday from 7:30 am - 5:00 pm.

## 2021-12-09 NOTE — Nurses Notes (Signed)
Dr. Candiss Norse notified hat patient is experiencing pressure in his chest at this time. Awaiting MD to come and see patient prior to discharge. WIll continue to monitor.

## 2021-12-10 ENCOUNTER — Telehealth (INDEPENDENT_AMBULATORY_CARE_PROVIDER_SITE_OTHER): Payer: Self-pay | Admitting: Neurological Surgery

## 2021-12-10 NOTE — Telephone Encounter (Signed)
CM contacted PT and went over Non Fasting guidelines for upcoming procedure on 12/12/21 with Dr Kennon Rounds. PT had no further questions at this time Clinic phone number was provided to PT at this time.    12/10/2021  Danella Sensing, CASE MANAGER

## 2021-12-19 ENCOUNTER — Ambulatory Visit (INDEPENDENT_AMBULATORY_CARE_PROVIDER_SITE_OTHER): Payer: Medicare Other | Admitting: Neurological Surgery

## 2021-12-29 ENCOUNTER — Other Ambulatory Visit: Payer: Self-pay

## 2021-12-29 ENCOUNTER — Ambulatory Visit
Payer: Medicare Other | Attending: Student in an Organized Health Care Education/Training Program | Admitting: Student in an Organized Health Care Education/Training Program

## 2021-12-29 ENCOUNTER — Encounter (HOSPITAL_BASED_OUTPATIENT_CLINIC_OR_DEPARTMENT_OTHER): Payer: Self-pay | Admitting: Student in an Organized Health Care Education/Training Program

## 2021-12-29 VITALS — BP 118/60 | HR 66 | Temp 97.4°F | Ht 69.45 in | Wt 271.8 lb

## 2021-12-29 DIAGNOSIS — Z0001 Encounter for general adult medical examination with abnormal findings: Secondary | ICD-10-CM

## 2021-12-29 DIAGNOSIS — K7581 Nonalcoholic steatohepatitis (NASH): Secondary | ICD-10-CM | POA: Insufficient documentation

## 2021-12-29 DIAGNOSIS — Z0000 Encounter for general adult medical examination without abnormal findings: Secondary | ICD-10-CM | POA: Insufficient documentation

## 2021-12-29 DIAGNOSIS — Z8546 Personal history of malignant neoplasm of prostate: Secondary | ICD-10-CM

## 2021-12-29 DIAGNOSIS — Z08 Encounter for follow-up examination after completed treatment for malignant neoplasm: Secondary | ICD-10-CM | POA: Insufficient documentation

## 2021-12-29 DIAGNOSIS — Z13220 Encounter for screening for lipoid disorders: Secondary | ICD-10-CM | POA: Insufficient documentation

## 2021-12-29 DIAGNOSIS — I10 Essential (primary) hypertension: Secondary | ICD-10-CM | POA: Insufficient documentation

## 2021-12-29 DIAGNOSIS — R7303 Prediabetes: Secondary | ICD-10-CM | POA: Insufficient documentation

## 2021-12-29 DIAGNOSIS — E7800 Pure hypercholesterolemia, unspecified: Secondary | ICD-10-CM | POA: Insufficient documentation

## 2021-12-29 NOTE — Progress Notes (Signed)
Department of Family Medicine   Progress Note    Henry [name]  MRN: [id number]  DOB: Nov 27, 1955  Date of Service: 12/29/2021    CHIEF COMPLAINT  Chief Complaint   Patient presents with    Annual Exam       SUBJECTIVE  Henry [name] is a 66 y.o. male who presents to clinic for annual wellness visit.     Hypertension:  Denies any significant side effects with current medications.  Denies any chest pain, shortness and breath, or dizziness.      Hyperlipidemia:  Denies side effects from current dose of Lipitor.  Has not working diet and exercise.      History prostate cancer:  Not having any significant lower urinary tract symptoms at present.      Review of Systems:  Positive ROS discussed in HPI, otherwise all other systems negative.      Medications:   Acetaminophen 500 mg Oral Capsule, Take 1 Cap (500 mg total) by mouth Every 6 hours as needed  atorvastatin (LIPITOR) 40 mg Oral Tablet, Take 1 Tablet (40 mg total) by mouth Once a day  diclofenac sodium (VOLTAREN) 75 mg Oral Tablet, Delayed Release (E.C.), Take 1 Tablet (75 mg total) by mouth Twice daily  dicyclomine (BENTYL) 20 mg Oral Tablet, Take 1 Tablet (20 mg total) by mouth Four times a day  GLUCOS CHOND CPLX ADVANCED ORAL, Take 2 Tabs by mouth Once a day Glucosamine-Chondroitin 1500 mg  hydroCHLOROthiazide (HYDRODIURIL) 25 mg Oral Tablet, Take 1 Tablet (25 mg total) by mouth Once a day  losartan (COZAAR) 100 mg Oral Tablet, Take 1 Tablet (100 mg total) by mouth Once a day  metoprolol tartrate (LOPRESSOR) 50 mg Oral Tablet, Take 1 Tablet (50 mg total) by mouth Twice daily  metroNIDAZOLE 1 % Gel with Pump, by Apply externally route Once a day  multivitamin Oral Tablet, Take 1 Tablet by mouth Once a day  Non-Formulary/Special Preparation (MEDICATION HELP), CBD 2 capsules per day   Omeprazole 20 mg Oral Tablet, Delayed Release (E.C.), Take by mouth Once a day  triamcinolone acetonide 0.1 % Cream,     No facility-administered medications prior to  visit.      Allergies:   Allergies   Allergen Reactions    Sulfa (Sulfonamides) Rash    Shellfish Containing Products Itching and Swelling     Shrimp - eye swelling, itching         OBJECTIVE  BP 118/60   Pulse 66   Temp 36.3 C (97.4 F) (Thermal Scan)   Ht 1.764 m (5' 9.45")   Wt 123 kg (271 lb 13.2 oz)   SpO2 97%   BMI 39.62 kg/m       General: no distress  HENT: TMs clear, mouth mucous membranes moist, pharynx without injection or exudate   Lungs: clear to auscultation bilaterally  Cardiovascular: RRR, no murmur  Abdomen: soft, non tender, bowel sounds present  Extremities: no cyanosis or edema  Skin: warm and dry, no rash  Neurologic: gait is normal, AOx3, CN 2-12 grossly intact  Psychiatric: normal affect and behavior    ASSESSMENT/PLAN  (Z00.00) Medicare welcome visit  (primary encounter diagnosis)  Plan:  See separate note    (I10) Primary hypertension  Plan: COMPREHENSIVE METABOLIC PANEL, NON-FASTING        Chronic, controlled   -will get updated CMP   -continue hydrochlorothiazide 25 mg daily, losartan 100 mg daily, and Lopressor 50 mg b.i.d.    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      Dola Argyle, MD 12/29/2021, 11:27

## 2021-12-29 NOTE — Patient Instructions (Addendum)
Medicare Preventive Services  Medicare coverage information Recommendation for YOU   Heart Disease and Diabetes   Lipid profile every 5 years or more often if at risk for cardiovascular disease  Last Lipid Panel  (Last result in the past 2 years)        Cholesterol   HDL   LDL   Direct LDL   Triglycerides      04/14/21 0814 142   37   67  Comment: <100 mg/dL, Optimal  100-129 mg/dL, Near/Above Optimal  130-159 mg/dL, Borderline High  160-189 mg/dL, High  >=190 mg/dL, Very high     230             Diabetes Screening with Blood Glucose test or Glucose Tolerance Test  yearly for those at risk for diabetes, up to two tests per year for those with prediabetes  Last Glucose: 154     Diabetes Self-Management Training initial training ten hours per year, and follow-up training two hours per subsequent year. Optional for those with diabetes    Medical Nutrition Therapy three hours of one-on-one counseling in first year, two hours in subsequent years. Optional for those with diabetes, kidney disease   Intensive Behavioral Therapy for Obesity  Face-to-face counseling, first month every week, month 2-6 every other week, month 7-12 every month if continued progress is documented Optional for those with Body Mass Index 30 or higher  Your There is no height or weight on file to calculate BMI.   Tobacco Cessation (Quitting) Counseling   Two attempts per year, max 4 sessions per attempt, up to 8 sessions per year Optional for those who use tobacco    Cancer Screening   Colorectal screening   For anyone age 73 to 73 or any age if high risk:  Screening Colonoscopy every 10 yrs if low risk,  more frequent if higher risk  OR  Cologuard Stool DNA test once every 3 years OR  Fecal Occult Blood Testing yearly OR  Flexible  Sigmoidoscopy  every 5 yr OR  CT Colonography every 5 yrs  Colonoscopy completed 04/15/18, repeat in 7-10 years.   Prostate Cancer Screening  Prostate Specific Antigen blood test based on joint decision making with your  provider for ages 40-69  A joint decision between you and your primary care provider  Completed 08/22/21.   Abdominal aortic aneurysm screening  One time screening for men 21 or older who have smoked more than 100 cigarettes in their lifetime.  Completed 10/22/20.   Lung Cancer Screening  Annual low dose computed tomography (LDCT scan) is recommended for those age 43-77 who smoked 20 pack-years and are current smokers or quit smoking within past 15 years (one pack-year= smoking one PPD for one year), after counseling by your doctor or nurse clinician about the possible benefits or harms. See Your Schedule below   Vaccinations   Pneumococcal Vaccine: Recommended routinely age 82+ with one or two separate vaccines based on your risk    Recommended before age 88 if medical conditions with increased risk  Seasonal Influenza Vaccine: Once every flu season   Hepatitis B Vaccine: 3 doses if risk (including anyone with diabetes or liver disease)  Shingles Vaccine: Two doses at age 31 or older  Diphtheria Tetanus Pertussis Vaccine: ONCE as adult, booster every 10 years     Immunization History   Administered Date(s) Administered    Covid-19 Vaccine,Pfizer Bivalent,64mg/0.3ml,12 yrs+ 11/15/2020    Covid-19 Vaccine,Pfizer-BioNTech,Gray Top,159yr 06/27/2020    Covid-19 Vaccine,Pfizer-BioNTech,Purple  Top,24yr+ 04/18/2019, 05/06/2019, 12/09/2019    Diptheria & Tetanus Toxoid,Adsorbed, (Decavac/Tenivac) 78YRS & OLDER 04/15/1999    Diptheria/Tetanus,Adsorbed (ADMIN) 01/30/2010    INFLUENZA VIRUS VACCINE (ADMIN) 01/23/2002, 01/02/2006, 10/30/2010    Influenza Vaccine, 6 month-adult 01/17/2014, 12/18/2014, 11/24/2016, 12/21/2017, 11/21/2019    Influenza Vaccine, 65+ 11/15/2020, 11/20/2021    Pneumovax 06/11/2020    Shingrix - Zoster Vaccine 08/27/2016, 10/19/2017    Tetanus Toxoid/Diphtheria Toxoid/Acellular Pertussis Vaccine, Adsorbed (Tdap) 06/11/2020    VAXNEUVANCE(PCV 15) 11/20/2021     Recommended COVID-19 (([id number] Vaccine.      Other Screening   Glaucoma Screening   yearly if in high risk group such as diabetes, family history, African American age 66+or Hispanic American age 66+See your Eye Care Provider   Hepatitis C Screening recommended ONCE for those born between 1945-1965, or high risk for HCV infection  Completed 09/10/16.   HIV Testing recommended routinely at least ONCE, covered every year for age 3974to 647regardless of risk, and every year for age over 610who ask for the test or higher risk See Your Schedule below   Abdominal Aortic Aneurysm Screening Ultrasound Recommended ONCE for any male who smoked 100 cigarettes/lifetime OR with family history of aortic aneurysm       Your Personalized Schedule for Preventive Tests     Health Maintenance: Pending and Last Completed         Date Due Completion Date    Medicare Annual Wellness Visit Never done ---    Covid-19 Vaccine (6 - 2023-24 season) 10/17/2021 11/15/2020    Depression Screening 08/23/2022 08/22/2021    Prostate Cancer Screening 08/23/2023 08/22/2021    Colonoscopy 04/15/2028 04/15/2018    Override on 03/04/2006: Done    Adult Tdap-Td (3 - Td or Tdap) 06/12/2030 06/11/2020    Override on 01/30/2010: Done (Had done here at family medicine)              Will see you next year for Annual Wellness Visit!  It was nice meeting you!  TPosen

## 2021-12-29 NOTE — Progress Notes (Signed)
FAMILY MEDICINE, [place] [address]  Operated by [facility]  Medicare Annual Wellness Visit    Name: Henry [name] MRN:  [id number]   Date: 12/29/2021 Age: 66 y.o.       SUBJECTIVE:   Henry [name] is a 66 y.o. male for presenting for Medicare Wellness exam.   I have reviewed and reconciled the medication list with the patient today.        12/29/2021    10:58 AM   Comprehensive Health Assessment-Adult   Do you wish to complete this form? Yes   During the past 4 weeks, how would you rate your health in general? Good   During the past 4 weeks, how much difficulty have you had doing your usual activities inside and outside your home because of medical or emotional problems? A little bit of difficulty   During the past 4 weeks, was someone available to help you if you needed and wanted help? Yes, as much as I wanted   In the past year, how many times have you gone to the emergency department or been admitted to a hospital for a health problem? 1 time   Are you generally satisfied with your sleep? Yes   Do you have enough money to buy things you need in everyday life, such as food, clothing, medicines, and housing? Yes, always   Can you get to places beyond walking distance without help?  (For example, can you drive your own car or travel alone on buses)? Yes   Do you fasten your seatbelt when you are in a car? Yes, usually   Do you exercise 20 minutes 3 or more days per week (such as walking, dancing, biking, mowing grass, swimming)? Yes, most of the time   How often do you eat food that is healthy (fruits, vegetables, lean meats) instead of unhealthy (sweets, fast food, junk food, fatty foods)? Almost always   Have your parents, brothers or sisters had any of the following problems before the age of 61? (check all that apply) Diabetes (sugar);Heart problems, or hardening of the arteries;High cholesterol;Cancer;Other family illness    How often do you have trouble taking medicines the eay you are told to take them? I always take them as prescribed   Do you need any help communicating with your doctors and nurses because of vision or hearing problems? No   During the past 12 months, have you experienced confusion or memory loss that is happening more often or is getting worse? No   Do you have one person you think of as your personal doctor (primary care provider or family doctor)? Yes   If you are seeing a Primary Care Provider (PCP) or family doctor. please list their name Dr. Zenia Resides Makaya Juneau   Are you now also seeing any specialist physician(s) (such as eye doctor, foot doctor, skin doctor)? Yes   If you are seeing a specialist for anything such as foot, eye, skin, etc.  please list their name(s) see list   How confident are you that you can control or manage most of your health problems? Very confident         I have reviewed and updated as appropriate the past medical, family and social history. 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    Pb  colonoscopy,diagnostic       Current Outpatient Medications   Medication Sig    Acetaminophen 500 mg Oral Capsule Take 1 Cap (500 mg total) by mouth Every 6 hours as needed    atorvastatin (LIPITOR) 40 mg Oral Tablet Take 1 Tablet (40 mg total) by mouth Once a day    diclofenac sodium (VOLTAREN) 75 mg Oral Tablet, Delayed Release (E.C.) Take 1 Tablet (75 mg total) by mouth Twice daily    dicyclomine (BENTYL) 20 mg Oral Tablet Take 1 Tablet (20 mg total) by mouth Four times a day    GLUCOS CHOND CPLX ADVANCED ORAL Take 2 Tabs by mouth Once a day Glucosamine-Chondroitin 1500 mg    hydroCHLOROthiazide (HYDRODIURIL) 25 mg Oral Tablet Take 1 Tablet (25 mg total) by mouth Once a day    losartan (COZAAR) 100 mg Oral Tablet Take 1 Tablet (100 mg total) by mouth Once a day    metoprolol tartrate (LOPRESSOR) 50 mg Oral Tablet Take 1 Tablet (50 mg total) by mouth Twice daily    metroNIDAZOLE 1 % Gel with Pump by Apply externally route Once a day    multivitamin Oral Tablet Take 1 Tablet by mouth Once a day    Non-Formulary/Special Preparation (MEDICATION HELP) CBD 2 capsules per day     Omeprazole 20 mg Oral Tablet, Delayed Release (E.C.) Take by mouth Once a day    triamcinolone acetonide 0.1 % Cream      Family Medical History:       Problem Relation (Age of Onset)    Asthma Father    Atherosclerosis Paternal Grandmother    Breast Cancer Maternal Grandmother    COPD Father    Cancer Mother, Father    Coronary Artery Disease Mother    Diabetes Mother    High Cholesterol Mother    Hypertension (High Blood Pressure) Mother    Melanoma Brother    Migraines Mother    Other Maternal Grandfather    Prostate Cancer Paternal Grandfather    Stomach Cancer Maternal Grandmother            Social History     Socioeconomic History    Marital status: Married    Number of children: 0   Occupational History  Employer: [employer]    Occupation: retired     Comment: formerly worked administration in [place] Use    Smoking status:  Former     Types: Cigars     Quit date: 2012     Years since quitting: 11.8    Smokeless tobacco: Never    Tobacco comments:     quit cigars in 2012   Vaping Use    Vaping Use: Never used   Substance and Sexual Activity    Alcohol use: Yes     Alcohol/week: 2.0 standard drinks of alcohol     Types: 2 Cans of beer per week     Comment: per week    Drug use: No     Comment: capsule     Social Determinants of Health     Health Literacy: Low Risk  (12/29/2021)    Health Literacy     SDOH Health Literacy: Never         List of Current Health Care Providers   Care Team       PCP       Name Type Specialty Phone Number    Lillymae Duet, Zenia Resides, MD Physician [place] [phone]              Care Team       Name Type Specialty Phone Number    Larey Days [facility] Nurse Practitioner NURSE PRACTITIONER [phone]    Ashok Cordia, MD Physician PAIN MANAGEMENT [phone]                      Health Maintenance   Topic Date Due    Depression Screening  12/30/2022    Medicare Annual Wellness Visit  12/30/2022    Prostate Cancer Screening  08/23/2023    Colonoscopy  04/15/2028    Adult Tdap-Td (3 - Td or Tdap) 06/12/2030    Hepatitis C screening  Completed    AAA Screening  Completed    Influenza Vaccine  Completed    Shingles Vaccine  Completed    Pneumococcal Vaccination, Age 67+  Completed    Meningococcal Vaccine  Aged Out    Covid-19 Vaccine  Discontinued     Medicare Wellness Assessment   Medicare initial or wellness physical in the last year?: No  Advance Directives   Does patient have a living will or MPOA: YES   Has patient provided [employer] with a copy?: YES   Advance directive information given to the patient today?: no      Activities of Daily Living   Do you need help with dressing, bathing, or walking?: No   Do you need help with shopping, housekeeping, medications, or finances?: No   Do you have rugs in hallways, broken steps, or poor lighting?: No   Do you have grab bars in your  bathroom, non-slip strips in your tub, and hand rails on your stairs?: Yes (no grab bars)   Urinary Incontinence Screen   Do you ever leak urine when you don't want to?: No   Cognitive Function Screen (1=Yes, 0=No)   What is you age?: Correct   What is the time to the nearest hour?: Correct   What is the year?: Correct   What is the name of this clinic?: Correct   Can the patient recognize two persons (the doctor, the nurse, home help, etc.)?: Correct   What is the date of your birth? (day and month sufficient) : Correct  In what year did World War II end?: Correct   Who is the current president of the [place]?: Correct   Count from 20 down to 1?: Correct   What address did I give you earlier?: Correct   Total Score: 10   Interpretation of Total Score: Greater than 6 Normal   Hearing Screen   Have you noticed any hearing difficulties?: No  After whispering 9-1-6 how many numbers did the patient repeat correctly?: 3   Fall Risk Screen   Do you feel unsteady when standing or walking?: No  Do you worry about falling?: No  Have you fallen in the past year?: No   Vision Screen   Right Eye = 20: 16 (with contacts)   Left Eye = 20: 16 (with contacts)   Depression Screen     Little interest or pleasure in doing things.: Not at all  Feeling down, depressed, or hopeless: Not at all  PHQ 2 Total: 0     Pain Score   Pain Score:   5 (arthritis; left hip; worse later in the day)    Substance Use-Abuse Screening     Tobacco Use     In Past 12 MONTHS, how often have you used any tobacco product (for example, cigarettes, e-cigarettes, cigars, pipes, or smokeless tobacco)?: Never     Alcohol use     In the PAST 12 MONTHS, how often have you had 5 (men)/4 (women) or more drinks containing alcohol in one day?: Never     Prescription Drug Use     In the PAST 12 months, how often have you used any prescription medications just for the feeling, more than prescribed, or that were not prescribed for you? Prescriptions may include:  opioids, benzodiazepines, medications for ADHD: Never           Illicit Drug Use   In the PAST 12 MONTHS, how often have you used any drugs, including marijuana, cocaine or crack, heroin, methamphetamine, hallucinogens, ecstasy/MDMA?: Never           OBJECTIVE:   BP 118/60   Pulse 66   Temp 36.3 C (97.4 F) (Thermal Scan)   Ht 1.764 m (5' 9.45")   Wt 123 kg (271 lb 13.2 oz)   SpO2 97%   BMI 39.62 kg/m        Other appropriate exam:    There are no preventive care reminders to display for this patient.   ASSESSMENT & PLAN:   Assessment/Plan   1. Medicare welcome visit    2. Primary hypertension    3. Hypercholesterolemia    4. Prediabetes    5. Encounter for follow-up surveillance of prostate cancer    6. NASH (nonalcoholic steatohepatitis)    7. Screening cholesterol level       Identified Risk Factors/ Recommended Actions       The PHQ 2 Total: 0 depression screen is interpreted as negative.          Orders Placed This Encounter    COMPREHENSIVE METABOLIC PANEL, NON-FASTING    HGA1C (HEMOGLOBIN A1C WITH EST AVG GLUCOSE)    PSA DIAGNOSTIC WITH FREE PSA REFLEX    LIPID PANEL          The patient has been educated about risk factors and recommended preventive care. Written Prevention Plan completed/ updated and given to patient (see After Visit Summary).    Return in about 6 months (around 06/29/2022), or if symptoms worsen or fail to improve.  Dola Argyle, MD

## 2022-01-01 ENCOUNTER — Telehealth (INDEPENDENT_AMBULATORY_CARE_PROVIDER_SITE_OTHER): Payer: Self-pay | Admitting: Neurological Surgery

## 2022-01-01 ENCOUNTER — Encounter (INDEPENDENT_AMBULATORY_CARE_PROVIDER_SITE_OTHER): Payer: Self-pay

## 2022-01-01 NOTE — Telephone Encounter (Signed)
CM left voice message requesting a return phone call to review guidelines. MyChart message sent to PT about Guidelines.    01/01/2022  Danella Sensing, CASE MANAGER

## 2022-01-06 ENCOUNTER — Other Ambulatory Visit (HOSPITAL_BASED_OUTPATIENT_CLINIC_OR_DEPARTMENT_OTHER): Payer: Self-pay | Admitting: Internal Medicine

## 2022-01-06 ENCOUNTER — Telehealth (HOSPITAL_BASED_OUTPATIENT_CLINIC_OR_DEPARTMENT_OTHER): Payer: Self-pay

## 2022-01-06 DIAGNOSIS — M79609 Pain in unspecified limb: Secondary | ICD-10-CM

## 2022-01-06 DIAGNOSIS — R7401 Elevation of levels of liver transaminase levels: Secondary | ICD-10-CM

## 2022-01-06 DIAGNOSIS — E669 Obesity, unspecified: Secondary | ICD-10-CM

## 2022-01-06 DIAGNOSIS — D72829 Elevated white blood cell count, unspecified: Secondary | ICD-10-CM

## 2022-01-06 NOTE — Telephone Encounter (Addendum)
Called pt and he will have labs completed at [facility] next week. Pt happy for the call, just wants a OR date        ----- Message from Piedad Climes, MD sent at 01/06/2022  8:58 AM EST -----  He does need to repeat some labs in the next few weeks, I'll order those    ----- Message -----  From: Gracelyn Nurse, RN  Sent: 01/06/2022   8:49 AM EST  To: Piedad Climes, MD    Pt just had a ercp for stent removal last month, cleared in May , pt still below weight , no OR yet

## 2022-01-14 ENCOUNTER — Telehealth (INDEPENDENT_AMBULATORY_CARE_PROVIDER_SITE_OTHER): Payer: Self-pay | Admitting: Neurological Surgery

## 2022-01-14 NOTE — Telephone Encounter (Signed)
CM contacted PT and went over Non Fasting guidelines for upcoming procedure on 01/16/22 with Dr Kennon Rounds. PT had no further questions at this time Clinic phone number was provided to PT at this time.    01/14/2022  Danella Sensing, CASE MANAGER

## 2022-01-16 ENCOUNTER — Encounter (INDEPENDENT_AMBULATORY_CARE_PROVIDER_SITE_OTHER): Payer: Self-pay | Admitting: Neurological Surgery

## 2022-01-16 ENCOUNTER — Other Ambulatory Visit: Payer: Self-pay

## 2022-01-16 ENCOUNTER — Ambulatory Visit: Payer: Medicare Other | Attending: Neurological Surgery | Admitting: Neurological Surgery

## 2022-01-16 VITALS — Temp 97.3°F | Wt 277.3 lb

## 2022-01-16 DIAGNOSIS — M48061 Spinal stenosis, lumbar region without neurogenic claudication: Secondary | ICD-10-CM | POA: Insufficient documentation

## 2022-01-16 DIAGNOSIS — M545 Low back pain, unspecified: Secondary | ICD-10-CM | POA: Insufficient documentation

## 2022-01-16 NOTE — Nursing Note (Signed)
[place] Pain Rating Scale     On a scale of 0-10, during the past 24 hours, pain has interfered with you usual activity:       On a scale of 0-10, during the past 24 hours, pain has interfered with your sleep:      On a scale of 0-10, during the past 24 hours, pain has affected your mood:       On a scale of 0-10, during the past 24 hours, pain has contributed to your stress:       On a scale of 0-10, what is your overall pain Rating:

## 2022-01-16 NOTE — Patient Instructions (Signed)
[facility]  [address]  [place] [address]  Dept: [phone]  Dept Fax: [phone]  [phone]                                                 SPECIAL PROCEDURES                                     DISCHARGE FORM                                          [phone]      Please follow the instructions listed below for your procedures.  If you have questions concerning your procedure, you may call and leave a message.  Messages will be returned by the end of the next business day.  If you have an emergency, proceed to your local Emergency Department.      PROCEDURE: Lumbar Epidural Steroid Injection     Do not drive a car or operate machinery until tomorrow.  Rest today and return to normal activities tomorrow.  If you are on restricted activities by your physician, please continue to follow these.  If you are not sure, contact your physician.  It is possible to experience mild numbness of the lower back and legs.  This is temporary.  If you have soreness at the injection site, the application of heat or ice may be helpful. Mild analgesics may also be used.  In case of severe headache; lie flat to decrease it.  Increase all fluids especially those with caffeine.  Mild analgesics are also appropriate.  If headache is not relieved by these measures, contact the Pain Clinic.  Steroid injections may cause temporary increase of blood sugar levels.    These instructions have been reviewed with the patient and appropriate questions have answers.  Macario Golds, RN 01/16/2022 10:39

## 2022-01-16 NOTE — Procedures (Signed)
[facility]  [place] [address]  Operated by [facility]  Procedure Note    Name: Henry [name] MRN:  [id number]   Date: 01/16/2022 Age: 66 y.o.  DOB:   [birth year]       [address] - INJECT DIAGN/THERAP SUBSTANCE W/ IMAGE GUIDANCE, INTERLAMINAR EPIDURAL/SUBARACHNOID, LUMBAR/SACRAL (AMB ONLY)    Performed by: Ashok Cordia, MD  Authorized by: Ashok Cordia, MD        Ashok Cordia, MD      Lumbar Epidural Steroid Injection with Fluoro    Pre Procedure Dx:  lumbar stenosis    Post Procedure Dx:  lumbar stenosis    Guidance:  Fluoroscopy    Level:  L4/5    Skin anesthetic:  25m 1% lido    Injectate:  240mdex and 53m53m.25% bupi    Contrast:  4ml39m Consent and timeout were done.  Patient was placed in the prone position.  Skin was prepped and draped in the routine fashion and sterile technique was maintained throughout.    In an AP fluoroscopic view the level listed above was identified.  Skin was anesthetized.  Next, a 20 gauge Touhey needle was advanced in the midline until trajectory was established.  Next, a lateral view was obtained.  Once the needle reached the posterior aspect of the spinous processes, loss of resistance technique was utilized.  Once the loss of resistance occurred, contrast was injected between AP and lateral views under live fluoroscopy.  Characteristic epidural spread was noted.  No vascular spread was noted.  After repeat negative aspiration the above solution was injected.  Patient tolerated this very well.  Patient returned to the recovery area with no complaints or apparent complications.    JonaAshok Cordia

## 2022-01-22 ENCOUNTER — Telehealth (HOSPITAL_BASED_OUTPATIENT_CLINIC_OR_DEPARTMENT_OTHER): Payer: Self-pay

## 2022-01-22 NOTE — Telephone Encounter (Signed)
Pt has been cleared pt had a weight in the system on 12/1 277 lbs. Pt states heavy clothes and had shoes on. Pt weight today at his home was 270l bs goal is 275 lbs. Pt will have his labs completed next week. Pt forgot about this last week

## 2022-01-27 ENCOUNTER — Other Ambulatory Visit: Payer: Self-pay

## 2022-01-27 ENCOUNTER — Other Ambulatory Visit: Payer: Medicare Other | Attending: Internal Medicine

## 2022-01-27 DIAGNOSIS — R7401 Elevation of levels of liver transaminase levels: Secondary | ICD-10-CM | POA: Insufficient documentation

## 2022-01-27 DIAGNOSIS — D72829 Elevated white blood cell count, unspecified: Secondary | ICD-10-CM | POA: Insufficient documentation

## 2022-01-27 DIAGNOSIS — M79609 Pain in unspecified limb: Secondary | ICD-10-CM | POA: Insufficient documentation

## 2022-01-27 DIAGNOSIS — E669 Obesity, unspecified: Secondary | ICD-10-CM | POA: Insufficient documentation

## 2022-01-27 LAB — CBC WITH DIFF
BASOPHIL #: <0.1 10*3/uL (ref <=0.20)
BASOPHIL %: 1 %
EOSINOPHIL #: 0.23 10*3/uL (ref <=0.50)
EOSINOPHIL %: 3 %
HCT: 42.3 % (ref 38.9–52.0)
HGB: 14.5 g/dL (ref 13.4–17.5)
IMMATURE GRANULOCYTE #: <0.1 10*3/uL (ref <0.10)
IMMATURE GRANULOCYTE %: 1 % (ref 0.0–1.0)
LYMPHOCYTE #: 1.89 10*3/uL (ref 1.00–4.80)
LYMPHOCYTE %: 28 %
MCH: 28.5 pg (ref 26.0–32.0)
MCHC: 34.3 g/dL (ref 31.0–35.5)
MCV: 83.1 fL (ref 78.0–100.0)
MONOCYTE #: 0.51 10*3/uL (ref 0.20–1.10)
MONOCYTE %: 8 %
MPV: 8.8 fL (ref 8.7–12.5)
NEUTROPHIL #: 4.03 10*3/uL (ref 1.50–7.70)
NEUTROPHIL %: 60 %
PLATELETS: 248 10*3/uL (ref 150–400)
RBC: 5.09 10*6/uL (ref 4.50–6.10)
RDW-CV: 13.2 % (ref 11.5–15.5)
WBC: 6.8 10*3/uL (ref 3.7–11.0)

## 2022-01-27 LAB — COMPREHENSIVE METABOLIC PANEL, NON-FASTING
ALBUMIN: 3.9 g/dL (ref 3.4–4.8)
ALKALINE PHOSPHATASE: 90 U/L (ref 45–115)
ALT (SGPT): 42 U/L (ref 10–55)
ANION GAP: 9 mmol/L (ref 4–13)
AST (SGOT): 26 U/L (ref 8–45)
BILIRUBIN TOTAL: 0.5 mg/dL (ref 0.3–1.3)
BUN/CREA RATIO: 17 (ref 6–22)
BUN: 15 mg/dL (ref 8–25)
CALCIUM: 9.3 mg/dL (ref 8.6–10.3)
CHLORIDE: 104 mmol/L (ref 96–111)
CO2 TOTAL: 26 mmol/L (ref 23–31)
CREATININE: 0.86 mg/dL (ref 0.75–1.35)
ESTIMATED GFR - MALE: >90 mL/min/BSA (ref >=60)
GLUCOSE: 136 mg/dL — ABNORMAL HIGH (ref 65–125)
POTASSIUM: 4.3 mmol/L (ref 3.5–5.1)
PROTEIN TOTAL: 7 g/dL (ref 6.0–8.0)
SODIUM: 139 mmol/L (ref 136–145)

## 2022-01-27 LAB — PT/INR
INR: 0.93 (ref 0.80–1.20)
PROTHROMBIN TIME: 10.8 seconds (ref 9.1–13.9)

## 2022-01-27 LAB — HGA1C (HEMOGLOBIN A1C WITH EST AVG GLUCOSE)
ESTIMATED AVERAGE GLUCOSE: 137 mg/dL
HEMOGLOBIN A1C: 6.4 % — ABNORMAL HIGH (ref 4.0–5.6)

## 2022-02-05 ENCOUNTER — Ambulatory Visit (INDEPENDENT_AMBULATORY_CARE_PROVIDER_SITE_OTHER): Payer: Self-pay

## 2022-02-05 ENCOUNTER — Ambulatory Visit: Payer: Medicare HMO | Admitting: Podiatry

## 2022-02-12 ENCOUNTER — Encounter: Payer: Self-pay | Admitting: Podiatry

## 2022-02-12 ENCOUNTER — Ambulatory Visit: Payer: Medicare HMO | Admitting: Podiatry

## 2022-02-12 DIAGNOSIS — M25519 Pain in unspecified shoulder: Secondary | ICD-10-CM | POA: Insufficient documentation

## 2022-02-12 DIAGNOSIS — R319 Hematuria, unspecified: Secondary | ICD-10-CM | POA: Insufficient documentation

## 2022-02-12 DIAGNOSIS — R569 Unspecified convulsions: Secondary | ICD-10-CM | POA: Insufficient documentation

## 2022-02-12 DIAGNOSIS — Z653 Problems related to other legal circumstances: Secondary | ICD-10-CM | POA: Insufficient documentation

## 2022-02-12 DIAGNOSIS — F1220 Cannabis dependence, uncomplicated: Secondary | ICD-10-CM | POA: Insufficient documentation

## 2022-02-12 DIAGNOSIS — G44009 Cluster headache syndrome, unspecified, not intractable: Secondary | ICD-10-CM | POA: Insufficient documentation

## 2022-02-12 DIAGNOSIS — D709 Neutropenia, unspecified: Secondary | ICD-10-CM | POA: Insufficient documentation

## 2022-02-12 DIAGNOSIS — Z029 Encounter for administrative examinations, unspecified: Secondary | ICD-10-CM | POA: Insufficient documentation

## 2022-02-12 DIAGNOSIS — G8929 Other chronic pain: Secondary | ICD-10-CM | POA: Insufficient documentation

## 2022-02-12 DIAGNOSIS — L603 Nail dystrophy: Secondary | ICD-10-CM | POA: Insufficient documentation

## 2022-02-12 DIAGNOSIS — H9319 Tinnitus, unspecified ear: Secondary | ICD-10-CM | POA: Insufficient documentation

## 2022-02-12 DIAGNOSIS — F1211 Cannabis abuse, in remission: Secondary | ICD-10-CM | POA: Insufficient documentation

## 2022-02-12 DIAGNOSIS — N486 Induration penis plastica: Secondary | ICD-10-CM | POA: Insufficient documentation

## 2022-02-12 DIAGNOSIS — F419 Anxiety disorder, unspecified: Secondary | ICD-10-CM | POA: Insufficient documentation

## 2022-02-12 DIAGNOSIS — F1020 Alcohol dependence, uncomplicated: Secondary | ICD-10-CM | POA: Insufficient documentation

## 2022-02-12 DIAGNOSIS — F4310 Post-traumatic stress disorder, unspecified: Secondary | ICD-10-CM | POA: Insufficient documentation

## 2022-02-12 DIAGNOSIS — G5793 Unspecified mononeuropathy of bilateral lower limbs: Secondary | ICD-10-CM | POA: Diagnosis not present

## 2022-02-12 DIAGNOSIS — R399 Unspecified symptoms and signs involving the genitourinary system: Secondary | ICD-10-CM | POA: Insufficient documentation

## 2022-02-12 DIAGNOSIS — K589 Irritable bowel syndrome without diarrhea: Secondary | ICD-10-CM | POA: Insufficient documentation

## 2022-02-12 NOTE — Progress Notes (Signed)
Subjective:  Patient ID: Martin Singleton, male    DOB: Mar 29, 1955,  MRN: [id number] HPI Chief Complaint  Patient presents with   Nail Problem    3rd and 4th nail right - discolored nails, on terbinafine   Foot Pain    Neuropathy symptoms-not on any meds for that   New Patient (Initial Visit)    66 y.o. male presents with the above complaint.   ROS: Denies fever chills nausea vomit muscle aches pains calf pain back pain chest pain shortness of breath.  Past Medical History:  Diagnosis Date   Cluster headaches    entered in Seroquel study at [facility] 9-09   Colon polyps    5 tubular adenomas on 11-06-11   Degenerative disc disease, thoracic    Low back pain    from Spinal stenosis   Neck pain    from cervical spondylosis   Other social stressor    with depression   Pure hypercholesterolemia    Seizures (HCC)    Past Surgical History:  Procedure Laterality Date   CERVICAL DISCECTOMY     COLONOSCOPY     KNEE ARTHROSCOPY      Current Outpatient Medications:    acetaminophen (TYLENOL) 500 MG tablet, Take 500 mg by mouth every 6 (six) hours as needed., Disp: , Rfl:    celecoxib (CELEBREX) 200 MG capsule, Take 200 mg by mouth 2 (two) times daily., Disp: , Rfl:    docusate sodium (COLACE) 100 MG capsule, Take 100 mg by mouth 2 (two) times daily., Disp: , Rfl:    hydrophilic ointment, Apply topically as needed for dry skin., Disp: , Rfl:    magnesium oxide (MAG-OX) 400 (240 Mg) MG tablet, Take 400 mg by mouth daily., Disp: , Rfl:    mirtazapine (REMERON) 15 MG tablet, Take 15 mg by mouth at bedtime., Disp: , Rfl:    omeprazole (PRILOSEC) 20 MG capsule, Take 40 mg by mouth daily., Disp: , Rfl:    psyllium (METAMUCIL) 58.6 % packet, Take 1 packet by mouth daily., Disp: , Rfl:    tamsulosin (FLOMAX) 0.4 MG CAPS capsule, Take 0.4 mg by mouth., Disp: , Rfl:    terbinafine (LAMISIL) 250 MG tablet, Take 250 mg by mouth daily., Disp: , Rfl:   Allergies  Allergen Reactions   Tramadol Other  (See Comments)    unknown  unknown  unknown  unknown  unknown  unknown  unknown   Trazodone Other (See Comments)    Other reaction(s): Other (See Comments)  Headache   Headache   Headache   Other reaction(s): Other (See Comments)  Headache   Headache   Other reaction(s): Other (See Comments)  Headache   Codeine     headaches   Hydrocodone Bitartrate Er     headaches   Lyrica [Pregabalin]    Methadone Hcl    Nucynta [Tapentadol]    Oxycodone Other (See Comments)    "absent seizures"   Sansert [Methysergide]    Xanax [Alprazolam]     sedation   Review of Systems Objective:  There were no vitals filed for this visit.  General: Well developed, nourished, in no acute distress, alert and oriented x3   Dermatological: Skin is warm, dry and supple bilateral. Nails x 10 are thick yellow dystrophic clinically mycotic subungual debris remaining integument appears unremarkable at this time. There are no open sores, no preulcerative lesions, no rash or signs of infection present.  Vascular: Dorsalis Pedis artery and Posterior Tibial artery pedal pulses  are 2/4 bilateral with immedate capillary fill time. Pedal hair growth present. No varicosities and no lower extremity edema present bilateral.   Neruologic: Grossly intact via light touch bilateral. Vibratory intact via tuning fork bilateral. Protective threshold with Semmes Wienstein monofilament intact to all pedal sites bilateral. Patellar and Achilles deep tendon reflexes 2+ bilateral. No Babinski or clonus noted bilateral.   Musculoskeletal: No gross boney pedal deformities bilateral. No pain, crepitus, or limitation noted with foot and ankle range of motion bilateral. Muscular strength 5/5 in all groups tested bilateral.  Gait: Wheelchair-bound can transfer.  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  Follow-up with him should he have questions or concerns or unable to manipulate the [place].     Ramani Riva T. [place], [place]

## 2022-02-13 ENCOUNTER — Other Ambulatory Visit: Payer: Self-pay

## 2022-02-13 ENCOUNTER — Encounter (HOSPITAL_BASED_OUTPATIENT_CLINIC_OR_DEPARTMENT_OTHER): Payer: Self-pay | Admitting: FAMILY MEDICINE

## 2022-02-13 ENCOUNTER — Ambulatory Visit: Payer: Medicare Other | Attending: FAMILY PRACTICE | Admitting: FAMILY MEDICINE

## 2022-02-13 VITALS — BP 154/90 | HR 82 | Temp 97.3°F | Ht 69.45 in | Wt 276.2 lb

## 2022-02-13 DIAGNOSIS — H73893 Other specified disorders of tympanic membrane, bilateral: Secondary | ICD-10-CM | POA: Insufficient documentation

## 2022-02-13 DIAGNOSIS — H6122 Impacted cerumen, left ear: Secondary | ICD-10-CM | POA: Insufficient documentation

## 2022-02-13 DIAGNOSIS — R0981 Nasal congestion: Secondary | ICD-10-CM

## 2022-02-13 NOTE — Progress Notes (Cosign Needed)
Department of Family Medicine   Progress Note    [name]  MRN: [id number]  DOB: 1955/12/01  Date of Service: 02/13/2022    CHIEF COMPLAINT  Chief Complaint   Patient presents with    Ear Fluid       SUBJECTIVE  Henry [name] is a 66 y.o. male who presents to clinic for an acute patient visit.     L ear :   Timing: Started Wednesday evening   Context: Feels like there is fluid in his ear. Does state his wife recently developed a viral illness and he has been feeling off with some sinus congestion during the past week.   Associated symptoms: some mild sinus drainage   Main concern is the feeling of fluid in his ear.     Review of Systems:  Positive ROS discussed in HPI, otherwise all other systems negative.    PMH:  Past Medical History:   Diagnosis Date    Arthritis     Back problem     Cancer (CMS [place]) 11/2015    prostate cancer, melanoma back neck    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)     Chronic pain     CPAP (continuous positive airway pressure) dependence     CPAP 9 cm H2O    Deep vein thrombosis (DVT) (CMS [facility])     left leg    Eczema     GERD (gastroesophageal reflux disease)     well controlled on omeprazole    Heartburn     HTN     Hyperlipidemia     Hypertension     Irritable bowel syndrome     Melanoma (CMS [place])     Morbid obesity with BMI of 40.0-44.9, adult (CMS [place])     Neck problem     obstructive sleep apnea 01/2010    AHI 53.8    Osteoarthritis of back     Prostate cancer (CMS [place]) 12/11/2015    Rosacea     Tinnitus     Wears glasses     Glasses and contacts.         Patient Active Problem List   Diagnosis    Hyperlipemia    Irritable bowel syndrome    Hypercholesterolemia    Osteoarthritis, knee    Primary osteoarthritis of left hip    Normal colonoscopy 2008    OSA on CPAP    Sleep apnea    CPAP (continuous positive airway pressure) dependence    Hypertension    GERD (gastroesophageal reflux disease)    S/P right total hip arthroplasty    Cervical  disc disease    Clinical trial participant    Paresthesia of upper and lower extremities of both sides    Left leg pain    Hip pain    Varicose veins of left lower extremity    Lower back pain       Medications:   Acetaminophen 500 mg Oral Capsule, Take 1 Cap (500 mg total) by mouth Every 6 hours as needed  atorvastatin (LIPITOR) 40 mg Oral Tablet, Take 1 Tablet (40 mg total) by mouth Once a day  diclofenac sodium (VOLTAREN) 75 mg Oral Tablet, Delayed Release (E.C.), Take 1 Tablet (75 mg total) by mouth Twice daily  dicyclomine (BENTYL) 20 mg Oral Tablet, Take 1 Tablet (20 mg total) by mouth Four times a day  GLUCOS CHOND CPLX ADVANCED ORAL, Take 2 Tabs by mouth  Once a day Glucosamine-Chondroitin 1500 mg  hydroCHLOROthiazide (HYDRODIURIL) 25 mg Oral Tablet, Take 1 Tablet (25 mg total) by mouth Once a day  losartan (COZAAR) 100 mg Oral Tablet, Take 1 Tablet (100 mg total) by mouth Once a day  metoprolol tartrate (LOPRESSOR) 50 mg Oral Tablet, Take 1 Tablet (50 mg total) by mouth Twice daily  metroNIDAZOLE 1 % Gel with Pump, by Apply externally route Once a day  multivitamin Oral Tablet, Take 1 Tablet by mouth Once a day  Non-Formulary/Special Preparation (MEDICATION HELP), CBD 2 capsules per day   Omeprazole 20 mg Oral Tablet, Delayed Release (E.C.), Take by mouth Once a day  triamcinolone acetonide 0.1 % Cream,     No facility-administered medications prior to visit.      Allergies:   Allergies   Allergen Reactions    Sulfa (Sulfonamides) Rash    Shellfish Containing Products Itching and Swelling     Shrimp - eye swelling, itching       Social Hx:  Social History     Tobacco Use    Smoking status: Former     Types: Cigars     Quit date: 2012     Years since quitting: 12.0    Smokeless tobacco: Never    Tobacco comments:     quit cigars in 2012   Vaping Use    Vaping Use: Never used   Substance Use Topics    Alcohol use: Yes     Alcohol/week: 2.0 standard drinks of alcohol     Types: 2 Cans of beer per week      Comment: per week    Drug use: No     Comment: capsule       OBJECTIVE  Weight: 125 kg (276 lb 3.8 oz) Body mass index is 40.27 kg/m.  Vitals:    02/13/22 1405   BP: (!) 154/90   Pulse: 82   Temp: 36.3 C (97.3 F)   TempSrc: Thermal Scan   SpO2: 96%   Weight: 125 kg (276 lb 3.8 oz)   Height: 1.764 m (5' 9.45")   BMI: 40.35       General: Well dressed, well appearing, pleasant, cooperative, no acute distress  HENT: normocephalic, atraumatic, Mucous membranes moist, R TM clear, L TM has dried wax overlying his TM, both TM's are retracted   Lungs: clear to auscultation bilaterally, no wheezing   Cardiovascular: RRR, no murmurs appreciated   Abdomen: soft, non-protuberant   Extremities: no cyanosis or edema  Skin: warm and dry, no rash   Neurologic: gait is normal, AOx3, CN 2-12 grossly intact  Psychiatric: normal affect and behavior      ASSESSMENT/PLAN  Henry [name] is a 66 y.o. male who presents to clinic today for an acute patient visit.       ICD-10-CM    1. Impacted cerumen of left ear  H61.22 POCT EAR IRRIGATION/LAVAGE, NO INSTRUMENTATION - CLINICAL SUPPORT STAFF PERFORMED      2. Sinus congestion  R09.81       3. Retracted tympanic membrane, bilateral  H73.893            Sinus congestion with retracted bilateral TM, cerumen overlying his L TM   - acute, likely all related to recent viral illness affecting his sinuses and drainage   - Plan for ear irrigation to hopefully remove the cerumen impaction on L   - Instructed return precautions given likely viral illness       Orders  Placed This Encounter    POCT EAR IRRIGATION/LAVAGE, NO INSTRUMENTATION - CLINICAL SUPPORT STAFF PERFORMED         Return if symptoms worsen or fail to improve.      Burnice Logan, MD 02/13/2022, 14:28      *This note was partially generated using MModal Fluency Direct system, and there may be some incorrect words, spellings, and punctuation that were not noted in checking the note before saving.

## 2022-02-13 NOTE — Nursing Note (Signed)
left ear was flushed with warm tap water and peroxide mixture per order of Dr.  Laurance Flatten. Assisted by Dorie Rank, MA. Pt tolerated well.  Maren Beach, MA

## 2022-02-25 ENCOUNTER — Encounter (HOSPITAL_BASED_OUTPATIENT_CLINIC_OR_DEPARTMENT_OTHER): Payer: Self-pay

## 2022-03-03 ENCOUNTER — Ambulatory Visit (INDEPENDENT_AMBULATORY_CARE_PROVIDER_SITE_OTHER): Payer: Medicare Other

## 2022-03-05 ENCOUNTER — Ambulatory Visit: Payer: Medicare Other

## 2022-03-05 ENCOUNTER — Other Ambulatory Visit: Payer: Self-pay

## 2022-03-05 VITALS — BP 152/87 | HR 71 | Temp 98.1°F | Resp 20 | Ht 69.45 in | Wt 274.0 lb

## 2022-03-05 DIAGNOSIS — M25559 Pain in unspecified hip: Secondary | ICD-10-CM | POA: Insufficient documentation

## 2022-03-05 DIAGNOSIS — M545 Low back pain, unspecified: Secondary | ICD-10-CM | POA: Insufficient documentation

## 2022-03-05 DIAGNOSIS — M48061 Spinal stenosis, lumbar region without neurogenic claudication: Secondary | ICD-10-CM | POA: Insufficient documentation

## 2022-03-05 DIAGNOSIS — M47816 Spondylosis without myelopathy or radiculopathy, lumbar region: Secondary | ICD-10-CM | POA: Insufficient documentation

## 2022-03-05 NOTE — Progress Notes (Signed)
[facility]  [address] [place]  [place], [place] [address]  [phone]    Progress Note    Jarriel Papillion  MRN: [id number]  DOB: 10-Jul-1955  Date of Service: 03/05/2022    CHIEF COMPLAINT  Chief Complaint   Patient presents with    Back Pain    Hip Pain       SUBJECTIVE  Colbe Viviano is a 67 y.o. male who presents to clinic for procedure follow up. Patient had L4/5 LESI with Dr. Kennon Rounds performed on 01/16/22. Patient endorses 40% relief  still lasting . Denies any side effects or complication with the procedure. He reports no more leg pain. Bending, up and down stairs, and mobility was easier after procedure. Scheduled for left THA on April 29th. He currently works out at [employer] three times a week for two hours.     Description: aching  Timing: when he lays flat no pain, but mornings and movement make it worse  Location: low back  Onset: years   Radiating: no  Worse with: movement, bending  Better with: laying flat   Treatments tried: Tylenol    Red flag: denies dropping objects, denies issues with dexterity, denies bowel or bladder incontinence , and denies saddle anesthesia      Previous Update from Dr. Kennon Rounds 09/04/21:  History of Present Illness:  Johnta Couts is a 67 y.o. male  50% reduction with LESI.  Very pleased.  No side effects.     Neck pain with no radiation.  Cracks and pops.  Worse with rotation.  No change with PT.    TREATMENT HISTORY:      Helpful Not Helpful Not Tried  Comments   MBB       RFA/Cryoablation       Epidural X   07/22/21 L4/5 LESI 50% relief for couple months   Transforaminal/  Nerve root block        SI injection       Intraarticular Joint        Sympathetic Block       Other        TPI       GTB        SCS       Pain pump         Physical Therapy or HEP        Massage Therapy       Acupuncture        Chiropractor       Exercise Physiologist        Counseling/CBT         Medications:   Acetaminophen 500 mg Oral Capsule, Take  1 Cap (500 mg total) by mouth Every 6 hours as needed  atorvastatin (LIPITOR) 40 mg Oral Tablet, Take 1 Tablet (40 mg total) by mouth Once a day  diclofenac sodium (VOLTAREN) 75 mg Oral Tablet, Delayed Release (E.C.), Take 1 Tablet (75 mg total) by mouth Twice daily  dicyclomine (BENTYL) 20 mg Oral Tablet, Take 1 Tablet (20 mg total) by mouth Four times a day  GLUCOS CHOND CPLX ADVANCED ORAL, Take 2 Tabs by mouth Once a day Glucosamine-Chondroitin 1500 mg  hydroCHLOROthiazide (HYDRODIURIL) 25 mg Oral Tablet, Take 1 Tablet (25 mg total) by mouth Once a day  losartan (COZAAR) 100 mg Oral Tablet, Take 1 Tablet (100 mg total) by mouth Once a day  metoprolol tartrate (LOPRESSOR) 50 mg Oral Tablet, Take 1 Tablet (50 mg  total) by mouth Twice daily  metroNIDAZOLE 1 % Gel with Pump, by Apply externally route Once a day  multivitamin Oral Tablet, Take 1 Tablet by mouth Once a day  Non-Formulary/Special Preparation (MEDICATION HELP), CBD 2 capsules per day   Omeprazole 20 mg Oral Tablet, Delayed Release (E.C.), Take by mouth Once a day  triamcinolone acetonide 0.1 % Cream,     No facility-administered medications prior to visit.      Allergies:   Allergies   Allergen Reactions    Sulfa (Sulfonamides) Rash    Shellfish Containing Products Itching and Swelling     Shrimp - eye swelling, itching       Review of Systems:   CONSTITUTIONAL: Denies weight loss. Denies fever.   GI: Denies bowel incontinence.  GU: Denies urinary incontinence or retention.  NEUROLOGICAL: Denies headaches.   PSYCHIATRIC: Denies depression or anxiety. Denies acute changes in mood.  MUSCULOSKELETAL: See HPI.    OBJECTIVE  BP (!) 152/87   Pulse 71   Temp 36.7 C (98.1 F)   Resp 20   Ht 1.764 m (5' 9.45")   Wt 124 kg (274 lb 0.5 oz)   SpO2 96%   BMI 39.94 kg/m           General: no distress   Abdomen: soft, non-tender and non-distended   Respiratory: No increased work of breathing. No increased accessory muscle use   Cardiovascular: acyanotic, no  JVD  Skin: warm and dry, no rash  Neurologic: gait is antalgic , motor 5/5 and sensory intact in b/l UEs, AOx3, CN 2-12 grossly intact, negative Hoffman's b/l  Deep Tendon Reflexes    Brachioradialis Bicep Patellar Achilles   Right  2+ 2+ 2+ 2+   Left 2+ 2+ 2+ 2+     Psychiatric: normal affect and behavior  Musculoskeletal: no cyanosis or edema    Lumbar   ROM: Preserved   Palpation: Tender   Motor: 5/5 in LEs b/l   Sensory: Intact in LEs b/l   Facet loading: Positive bilaterally    Facet TTP: Positive bilaterally    Trigger points:  Lumbar paraspinal bilateral   Straight leg raise:  Negative bilaterally      SI Joint   Palpation: Non-tender bilaterally                  Pain w/ Internal & External rotation of hip: Negative bilaterally     Nursing Notes:   Randa Ngo, [place]  03/05/22 1544  Signed  Procedure Follow Up  [facility]  [id number]  Chief Complaint   Patient presents with    Back Pain    Hip Pain       [place] Pain Rating Scale     On a scale of 0-10, during the past 24 hours, pain has interfered with you usual activity: 7     On a scale of 0-10, during the past 24 hours, pain has interfered with your sleep: 4    On a scale of 0-10, during the past 24 hours, pain has affected your mood: 2     On a scale of 0-10, during the past 24 hours, pain has contributed to your stress: 2     On a scale of 0-10, what is your overall pain Rating: 5      Vitals:    03/05/22 1536   BP: (!) 152/87   Pulse: 71   Resp: 20   Temp: 36.7 C (98.1 F)   SpO2:  96%   Weight: 124 kg (274 lb 0.5 oz)   Height: 1.764 m (5' 9.45")   PainSc:   5   PainLoc: Back     Body mass index is 39.94 kg/m.    New imaging:    Patient is here today S/ P LESI that was done on 01/16/22 and reports 40 % relief that is still lasting.    Randa Ngo, [place]  03/05/2022, 15:42        Imaging: Reviewed    Recent Results (from the past [id number] hour(s))   MRI SPINE LUMBOSACRAL WO CONTRAST    Collection Time: 06/03/20 10:27 AM    Narrative    CLINICAL  INFORMATION:  [place]  Male, 67 years old.    MRI SPINE LUMBOSACRAL WO CONTRAST performed on 06/03/2020 10:27 AM.    REASON FOR EXAM:  M54.16: Lumbar radiculopathy    COMPARISON: Radiographs of the lumbar spine 05/05/2020.    TECHNIQUE: MRI of the lumbosacral spine was performed  without contrast. 3 plane localizer, sagittal T2, sagittal T1, coronal T2, coronal STIR, axial T2 and axial T1 sequences were obtained.    FINDINGS:   Straightening of the lumbar lordosis. Grade 1 anterolisthesis of L4 on L5. Vertebral body heights are normal. Marrow signal is heterogeneous without evidence of an aggressive marrow replacing process. Mild disc height loss is noted at L4-L5.    The conus ends at L1-L2. No clumping or thickening of the cauda equina.     Level by level evaluation is as follows:    T12-L1: No spinal canal or neural foraminal stenosis. There is mild disc bulging and bilateral facet hypertrophy.  L1-L2: No spinal canal or neural foraminal stenosis. Mild disc bulging and bilateral facet hypertrophy.  L2-L3: Disc bulging and moderate ligamentum flavum thickening result in mild spinal canal stenosis. Moderate bilateral facet hypertrophy results in mild bilateral neural foraminal stenosis.    L3-L4: Disc bulging and mild facet hypertrophy result in mild spinal canal stenosis. Posterior endplate osteophytes and moderate facet hypertrophy result in mild bilateral neural foraminal stenosis.    L4-L5: Diffuse disc bulging and advanced ligamentum flavum thickening results in severe spinal canal stenosis. There is effacement of the bilateral subarticular recesses with contact of the traversing bilateral L5 nerve roots. Advanced bilateral facet hypertrophy results in moderate bilateral neural foraminal stenosis. Far lateral disc bulging results in contact of the exiting bilateral L4 nerve roots.    L5-S1: Diffuse disc bulging and mild ligamentum flavum thickening do not result in spinal canal stenosis. Posterior  endplate osteophytes and moderate facet hypertrophy results in moderate bilateral neural foraminal stenosis. Far lateral disc bulging results in contact of the exiting right L5 nerve root.      Prevertebral soft tissues are normal.        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 There is associated effacement of the subarticular recesses with contact of the traversing bilateral L5 nerve roots.  2.Up to moderate neural foraminal stenosis of the bilateral L4-L5 and L5-S1 levels.  3.Far lateral disc bulging contacts the exiting bilateral L4 and exiting right L5 nerve roots.                ASSESSMENT    ICD-10-CM    1. Lumbar spondylosis  M47.816 PARAVERTEBRAL FACET INJ LUMBAR/SACRAL W FLUORO 2 LEVELS; 64493, 94494 Prior Auth/Scheduling Procedure     PAIN CL FL FACET INJ LUMBAR SACRAL 2 LEVELS  INJ DIAGNOSTIC/THERAPEUTIC AGENT, PARAVERTEBRAL FACET JOINT LUMBAR/SACRAL,UP TO 3 LEVELS     PARAVERTEBRAL FACET INJ LUMBAR/SACRAL W FLUORO 2 LEVELS; 64493, 16109 Prior Auth/Scheduling Procedure     PAIN CL FL FACET INJ LUMBAR SACRAL 2 LEVELS     INJ DIAGNOSTIC/THERAPEUTIC AGENT, PARAVERTEBRAL FACET JOINT LUMBAR/SACRAL,UP TO 3 LEVELS      2. Low back pain  M54.50 PARAVERTEBRAL FACET INJ LUMBAR/SACRAL W FLUORO 2 LEVELS; 64493, 60454 Prior Auth/Scheduling Procedure     PAIN CL FL FACET INJ LUMBAR SACRAL 2 LEVELS     INJ DIAGNOSTIC/THERAPEUTIC AGENT, PARAVERTEBRAL FACET JOINT LUMBAR/SACRAL,UP TO 3 LEVELS     PARAVERTEBRAL FACET INJ LUMBAR/SACRAL W FLUORO 2 LEVELS; 64493, 94494 Prior Auth/Scheduling Procedure     PAIN CL FL FACET INJ LUMBAR SACRAL 2 LEVELS     INJ DIAGNOSTIC/THERAPEUTIC AGENT, PARAVERTEBRAL FACET JOINT LUMBAR/SACRAL,UP TO 3 LEVELS      3. Lumbar spinal stenosis  M48.061       4. Hip pain, unspecified laterality  M25.559             PLAN/RECOMMENDATION  Orders Placed This Encounter    INJ DIAGNOSTIC/THERAPEUTIC AGENT, PARAVERTEBRAL FACET JOINT  LUMBAR/SACRAL,UP TO 3 LEVELS    INJ DIAGNOSTIC/THERAPEUTIC AGENT, PARAVERTEBRAL FACET JOINT LUMBAR/SACRAL,UP TO 3 LEVELS    PAIN CL FL FACET INJ LUMBAR SACRAL 2 LEVELS    PAIN CL FL FACET INJ LUMBAR SACRAL 2 LEVELS    PARAVERTEBRAL FACET INJ LUMBAR/SACRAL W FLUORO 2 LEVELS; 64493, 94494 Prior Auth/Scheduling Procedure    PARAVERTEBRAL FACET INJ LUMBAR/SACRAL W FLUORO 2 LEVELS; [id number], [id number] Prior Auth/Scheduling Procedure       1. Right then Left LMBB #1 L4-S1 with Dr. Kennon Rounds.    Not on AC.   2. Follow up after injections.     Our impression, treatment recommendations and plan from today's visit were reviewed in detail with the patient in the office. All of the patient's questions were answered. If a procedure was ordered, it was explained using a spine model,  including technique, benefits, alternatives and the associated risks. Patient informed of red flags symptoms like caudal anesthesia, bowel, bladder incontinence including the inability to urinate, weakness of legs that they need to go to the ED emergently for possible impingement of the spinal nerve roots and may need urgent decompression.  Patient confirmed understanding.   The patient verbalized understanding of the above plan and the patient wishes to move forward with the above noted plan.    Richrd Prime, FNP-C 03/05/2022, 16:14

## 2022-03-05 NOTE — Nursing Note (Signed)
Procedure Follow Up  Henry [name] [facility]  [id number]  Chief Complaint   Patient presents with    Back Pain    Hip Pain       [place] Pain Rating Scale     On a scale of 0-10, during the past 24 hours, pain has interfered with you usual activity: 7     On a scale of 0-10, during the past 24 hours, pain has interfered with your sleep: 4    On a scale of 0-10, during the past 24 hours, pain has affected your mood: 2     On a scale of 0-10, during the past 24 hours, pain has contributed to your stress: 2     On a scale of 0-10, what is your overall pain Rating: 5      Vitals:    03/05/22 1536   BP: (!) 152/87   Pulse: 71   Resp: 20   Temp: 36.7 C (98.1 F)   SpO2: 96%   Weight: 124 kg (274 lb 0.5 oz)   Height: 1.764 m (5' 9.45")   PainSc:   5   PainLoc: Back     Body mass index is 39.94 kg/m.    New imaging:    Patient is here today S/ P LESI that was done on 01/16/22 and reports 40 % relief that is still lasting.    Randa Ngo, MA  03/05/2022, 15:42

## 2022-03-23 ENCOUNTER — Encounter (INDEPENDENT_AMBULATORY_CARE_PROVIDER_SITE_OTHER): Payer: Self-pay

## 2022-03-23 ENCOUNTER — Telehealth (INDEPENDENT_AMBULATORY_CARE_PROVIDER_SITE_OTHER): Payer: Self-pay | Admitting: Neurological Surgery

## 2022-03-23 NOTE — Telephone Encounter (Signed)
CM left voice message requesting a return phone call to review guidelines. MyChart message sent to PT about Guidelines.    03/23/2022  Danella Sensing, CASE MANAGER

## 2022-03-30 ENCOUNTER — Telehealth (INDEPENDENT_AMBULATORY_CARE_PROVIDER_SITE_OTHER): Payer: Self-pay | Admitting: Neurological Surgery

## 2022-03-30 NOTE — Telephone Encounter (Signed)
CM contacted PT and went over Non Fasting guidelines for upcoming procedure on 02.13.24 and 02.20.24 with Dr Kennon Rounds. PT had no further questions at this time Clinic phone number was provided to PT at this time.    03/30/2022  Danella Sensing, CASE MANAGER

## 2022-03-31 ENCOUNTER — Encounter (INDEPENDENT_AMBULATORY_CARE_PROVIDER_SITE_OTHER): Payer: Self-pay | Admitting: Neurological Surgery

## 2022-03-31 ENCOUNTER — Other Ambulatory Visit: Payer: Self-pay

## 2022-03-31 ENCOUNTER — Ambulatory Visit: Payer: Medicare Other | Admitting: Neurological Surgery

## 2022-03-31 VITALS — Temp 97.3°F | Wt 274.5 lb

## 2022-03-31 DIAGNOSIS — M47816 Spondylosis without myelopathy or radiculopathy, lumbar region: Secondary | ICD-10-CM | POA: Insufficient documentation

## 2022-03-31 DIAGNOSIS — M545 Low back pain, unspecified: Secondary | ICD-10-CM

## 2022-03-31 NOTE — Patient Instructions (Signed)
[facility], [facility]  [address]  [place] [address]  Dept: [phone]  Dept Fax: [phone]  [phone]                                           SPECIAL PROCEDURES                                          DISCHARGE FORM                                               [phone]      Please follow the instructions listed below for your procedures.  If you have questions concerning your procedure, you may call and leave a message.  Messages will be returned by the end of the next business day.  If you have an emergency, proceed to your local Emergency Department.      PROCEDURE: Medial Branch Block-RIGHT LUMBAR               1.  Do not drive a car or operate machinery today.  2.  It is normal to feel numb at the site of injection for several hours.  3.  No heavy lifting.  4.  Perform your normal daily activities, especially those that usually are bothersome.  5. Please keep a journal of your pain relief on the day of injection.      JOURNAL:  ______________________________________________________________________________________________________________________________________________________________________________________________________________________________________________________________________________________________________________________________________________________________________________________________________________________________________________________________________________________________________________________________________________________________________________________________________________________________________________________    These instructions have been reviewed with the patient and appropriate questions have answers.  Wandalee Ferdinand, LPN [id number]

## 2022-03-31 NOTE — Nursing Note (Signed)
Pain and Function:     [place] Pain Rating Scale     On a scale of 0-10, during the past 24 hours, pain has interfered with you usual activity: 5     On a scale of 0-10, during the past 24 hours, pain has interfered with your sleep: 1    On a scale of 0-10, during the past 24 hours, pain has affected your mood: 1     On a scale of 0-10, during the past 24 hours, pain has contributed to your stress: 1     On a scale of 0-10, what is your overall pain Rating: 4

## 2022-03-31 NOTE — Procedures (Signed)
[facility] MA[facility]GEMENT, [facility]  [place] [address]  Operated by [facility]  Procedure Note    Name: Henry [name] MRN:  [id number]   Date: 03/31/2022 Age: 67 y.o.  DOB:   31-Jan-1956       INJ DIAGNOSTIC/THERAPEUTIC AGENT, PARAVERTEBRAL FACET JOINT LUMBAR/SACRAL,UP TO 3 LEVELS    Performed by: Ashok Cordia, MD  Authorized by: Richrd Prime, FNP-C    Number of levels::  2      Ashok Cordia, MD    Lumbar Medial Branch Blocks with Fluoro    Pre Procedure Dx:  lumbar spondylosis    Post Procedure Dx:  lumbar spondylosis    Guidance:  Fluoroscopy    Side:  right    Facets Blocked: L4/5/S1    Skin:  22m 1% lido    Injectate: 1.539m0.25% bupi    Consent and timeout done.  Skin prepped and draped.  Sterility maintained throughout.  Junction of SAP and transverse process at: right L4/5 as well as the sacral ala identified with fluoroscopy.  Skin anesthetized.  Spinal needles advanced until periosteum contacted at the above mentioned sites.  After negative aspiration, injectate administered.  Patient tolerated well.    JoAshok CordiaMD

## 2022-04-07 ENCOUNTER — Encounter (INDEPENDENT_AMBULATORY_CARE_PROVIDER_SITE_OTHER): Payer: Self-pay | Admitting: Neurological Surgery

## 2022-04-07 ENCOUNTER — Other Ambulatory Visit: Payer: Self-pay

## 2022-04-07 ENCOUNTER — Ambulatory Visit: Payer: Medicare Other | Admitting: Neurological Surgery

## 2022-04-07 VITALS — Temp 97.0°F | Wt 274.3 lb

## 2022-04-07 DIAGNOSIS — M545 Low back pain, unspecified: Secondary | ICD-10-CM | POA: Insufficient documentation

## 2022-04-07 DIAGNOSIS — M25559 Pain in unspecified hip: Secondary | ICD-10-CM | POA: Insufficient documentation

## 2022-04-07 DIAGNOSIS — M47816 Spondylosis without myelopathy or radiculopathy, lumbar region: Secondary | ICD-10-CM | POA: Insufficient documentation

## 2022-04-07 NOTE — Patient Instructions (Signed)
PAIN MANA[facility]EMENT, [facility]  [address]  [place] [address]  Dept: [phone]  Dept Fax: [phone]  [phone]                                           SPECIAL PROCEDURES                                          DISCHARGE FORM                                               [phone]      Please follow the instructions listed below for your procedures.  If you have questions concerning your procedure, you may call and leave a message.  Messages will be returned by the end of the next business day.  If you have an emergency, proceed to your local Emergency Department.      PROCEDURE: Medial Branch Block-LEFT LUMBAR               1.  Do not drive a car or operate machinery today.  2.  It is normal to feel numb at the site of injection for several hours.  3.  No heavy lifting.  4.  Perform your normal daily activities, especially those that usually are bothersome.  5. Please keep a journal of your pain relief on the day of injection.      JOURNAL:  ______________________________________________________________________________________________________________________________________________________________________________________________________________________________________________________________________________________________________________________________________________________________________________________________________________________________________________________________________________________________________________________________________________________________________________________________________________________________________________________    These instructions have been reviewed with the patient and appropriate questions have answers.  Wandalee Ferdinand, LPN [id number]

## 2022-04-07 NOTE — Procedures (Signed)
PAIN MANAGEMENT, [facility]  [place] [address]  Operated by [facility]  Procedure Note    Name: Michiel Fugett MRN:  [id number]   Date: 04/07/2022 Age: 67 y.o.  DOB:   08/17/1955       INJ DIAGNOSTIC/THERAPEUTIC AGENT, PARAVERTEBRAL FACET JOINT LUMBAR/SACRAL,UP TO 3 LEVELS    Performed by: Ashok Cordia, MD  Authorized by: Richrd Prime, FNP-C    Number of levels::  2      Ashok Cordia, MD        Lumbar Medial Branch Blocks with Fluoro    Pre Procedure Dx:  lumbar spondylosis    Post Procedure Dx:  lumbar spondylosis    Guidance:  Fluoroscopy    Side:  left    Facets Blocked: L4/5/s1    Skin:  13m 1% lido    Injectate:  1.553m0.2% bupi    Consent and timeout done.  Skin prepped and draped.  Sterility maintained throughout.  Junction of SAP and transverse process at: left L4/5 as well as the sacral ala identified with fluoroscopy.  Skin anesthetized.  Spinal needles advanced until periosteum contacted at the above mentioned sites.  After negative aspiration, injectate administered.  Patient tolerated well.    JoAshok CordiaMD

## 2022-04-07 NOTE — Nursing Note (Signed)
Pain and Function:     [place] Pain Rating Scale     On a scale of 0-10, during the past 24 hours, pain has interfered with you usual activity: 4     On a scale of 0-10, during the past 24 hours, pain has interfered with your sleep: 1    On a scale of 0-10, during the past 24 hours, pain has affected your mood: 0     On a scale of 0-10, during the past 24 hours, pain has contributed to your stress: 0     On a scale of 0-10, what is your overall pain Rating: 5

## 2022-04-10 ENCOUNTER — Other Ambulatory Visit (HOSPITAL_BASED_OUTPATIENT_CLINIC_OR_DEPARTMENT_OTHER): Payer: Self-pay | Admitting: Student in an Organized Health Care Education/Training Program

## 2022-04-10 DIAGNOSIS — I10 Essential (primary) hypertension: Secondary | ICD-10-CM

## 2022-04-21 ENCOUNTER — Other Ambulatory Visit: Payer: Self-pay

## 2022-04-21 ENCOUNTER — Encounter (INDEPENDENT_AMBULATORY_CARE_PROVIDER_SITE_OTHER): Payer: Self-pay

## 2022-04-21 ENCOUNTER — Ambulatory Visit: Payer: Medicare Other

## 2022-04-21 VITALS — BP 171/87 | HR 65 | Temp 97.8°F | Resp 20 | Ht 69.0 in | Wt 275.1 lb

## 2022-04-21 DIAGNOSIS — M545 Low back pain, unspecified: Secondary | ICD-10-CM | POA: Insufficient documentation

## 2022-04-21 DIAGNOSIS — M25552 Pain in left hip: Secondary | ICD-10-CM

## 2022-04-21 DIAGNOSIS — M47816 Spondylosis without myelopathy or radiculopathy, lumbar region: Secondary | ICD-10-CM

## 2022-04-21 DIAGNOSIS — M25559 Pain in unspecified hip: Secondary | ICD-10-CM | POA: Insufficient documentation

## 2022-04-21 NOTE — Progress Notes (Signed)
[facility]  [address], [place], [place] [address]  [phone]    Progress Note    Henry [name]  MRN: [id number]  DOB: 03-26-1955  Date of Service: 04/21/2022    CHIEF COMPLAINT  Chief Complaint   Patient presents with    Low Back Pain       SUBJECTIVE  Henry [name] is a 67 y.o. male who presents to clinic for procedure follow up. Patient had Right L4-S1 LMBB #1 with Dr. Kennon Rounds performed on 03/31/22 and Left L4-S1 LMBB #1 on 04/07/22. Patient endorses 90% relief 1 day(s) for both procedures. Denies any side effects or complication with the procedure.  Standing, walking for longer periods of time were easier post procedure. He is getting a Left THA on April 29th.      Description: aching  Timing: constant   Location: low back, left hip  Onset: years   Radiating: no  Worse with: movement, bending  Better with: laying flat   Treatments tried: Tylenol     Red flag: denies dropping objects, denies issues with dexterity, denies bowel or bladder incontinence , and denies saddle anesthesia.    Previous Update 03/05/22:  Henry [name] is a 67 y.o. male who presents to clinic for procedure follow up. Patient had L4/5 LESI with Dr. Kennon Rounds performed on 01/16/22. Patient endorses 40% relief  still lasting . Denies any side effects or complication with the procedure. He reports no more leg pain. Bending, up and down stairs, and mobility was easier after procedure. Scheduled for left THA on April 29th. He currently works out at [employer] three times a week for two hours.      Description: aching  Timing: when he lays flat no pain, but mornings and movement make it worse  Location: low back  Onset: years   Radiating: no  Worse with: movement, bending  Better with: laying flat   Treatments tried: Tylenol     Red flag: denies dropping objects, denies issues with dexterity, denies bowel or bladder incontinence , and denies saddle anesthesia.    TREATMENT HISTORY:         Helpful Not Helpful Not Tried  Comments   MBB           RFA/Cryoablation           Epidural X     07/22/21 L4/5 LESI 50% relief for couple months   Transforaminal/  Nerve root block            SI injection           Intraarticular Joint            Sympathetic Block           Other            TPI           GTB            SCS           Pain pump              Physical Therapy or HEP            Massage Therapy           Acupuncture            Chiropractor           Exercise Physiologist            Counseling/CBT  Medications:   Acetaminophen 500 mg Oral Capsule, Take 1 Cap (500 mg total) by mouth Every 6 hours as needed  atorvastatin (LIPITOR) 40 mg Oral Tablet, Take 1 Tablet (40 mg total) by mouth Once a day  diclofenac sodium (VOLTAREN) 75 mg Oral Tablet, Delayed Release (E.C.), Take 1 Tablet (75 mg total) by mouth Twice daily  dicyclomine (BENTYL) 20 mg Oral Tablet, Take 1 Tablet (20 mg total) by mouth Four times a day  GLUCOS CHOND CPLX ADVANCED ORAL, Take 2 Tabs by mouth Once a day Glucosamine-Chondroitin 1500 mg  hydroCHLOROthiazide (HYDRODIURIL) 25 mg Oral Tablet, Take 1 Tablet (25 mg total) by mouth Once a day  losartan (COZAAR) 100 mg Oral Tablet, Take 1 Tablet (100 mg total) by mouth Once a day  metoprolol tartrate (LOPRESSOR) 50 mg Oral Tablet, Take 1 Tablet (50 mg total) by mouth Twice daily  metroNIDAZOLE 1 % Gel with Pump, by Apply externally route Once a day  multivitamin Oral Tablet, Take 1 Tablet by mouth Once a day  Non-Formulary/Special Preparation (MEDICATION HELP), CBD 2 capsules per day   Omeprazole 20 mg Oral Tablet, Delayed Release (E.C.), Take by mouth Once a day  triamcinolone acetonide 0.1 % Cream,     No facility-administered medications prior to visit.      Allergies:   Allergies   Allergen Reactions    Sulfa (Sulfonamides) Rash    Shellfish Containing Products Itching and Swelling     Shrimp - eye swelling, itching       Review of Systems:   CONSTITUTIONAL: Denies weight loss.  Denies fever.   GI: Denies bowel incontinence.  GU: Denies urinary incontinence or retention.  NEUROLOGICAL: Denies headaches.   PSYCHIATRIC: Denies depression or anxiety. Denies acute changes in mood.  MUSCULOSKELETAL: See HPI.    OBJECTIVE  BP (!) 171/87   Pulse 65   Temp 36.6 C (97.8 F)   Resp 20   Ht 1.753 m ('5\' 9"'$ )   Wt 125 kg (275 lb 2.2 oz)   SpO2 96%   BMI 40.63 kg/m           General: no distress   Abdomen: soft, non-tender and non-distended   Respiratory: No increased work of breathing. No increased accessory muscle use   Cardiovascular: acyanotic, no JVD  Skin: warm and dry, no rash  Neurologic: gait is antalgic , motor 5/5 and sensory intact in b/l UEs, AOx3, CN 2-12 grossly intact, negative Hoffman's b/l  Deep Tendon Reflexes    Brachioradialis Bicep Patellar Achilles   Right  2+ 2+ 2+ 2+   Left 2+ 2+ 2+ 2+     Psychiatric: normal affect and behavior  Musculoskeletal: no cyanosis or edema    Lumbar   ROM: Preserved   Palpation: Tender   Motor: 5/5 in LEs b/l   Sensory: Intact in LEs b/l   Facet loading: Positive bilaterally    Facet TTP: Positive bilaterally    Trigger points:  Negative   Straight leg raise:  Negative bilaterally      SI Joint   Palpation: Non-tender bilaterally                  Pain w/ Internal & External rotation of hip: Positive left, negative right     Nursing Notes:   Thea Alken, [place]  04/21/22 1027  Signed  Procedure Follow Up  [name] [facility]  [id number]  Chief Complaint   Patient presents with    Low Back Pain   Pain Rating Scale     On a scale of 0-10, during the past 24 hours, pain has interfered with you usual activity: 6     On a scale of 0-10, during the past 24 hours, pain has interfered with your sleep: 2    On a scale of 0-10, during the past 24 hours, pain has affected your mood: 2     On a scale of 0-10, during the past 24 hours, pain has contributed to your stress: 2     On a scale of 0-10, what is your overall pain Rating: 6      Vitals:     04/21/22 1023   BP: (!) 171/87   Pulse: 65   Resp: 20   Temp: 36.6 C (97.8 F)   SpO2: 96%   Weight: 125 kg (275 lb 2.2 oz)   Height: 1.753 m ('5\' 9"'$ )   PainSc:   6   PainLoc: Back     Body mass index is 40.63 kg/m.    New imaging:    Patient is here today S/ P rt and left lmbb that was done on 03-31-22 and 04-07-22 and reports 90 % relief that lasted for 1 day  [facility], [place]  04/21/2022, 10:25       Imaging: Reviewed    Recent Results (from the past [id number] hour(s))   MRI SPINE LUMBOSACRAL WO CONTRAST    Collection Time: 06/03/20 10:27 AM    Narrative    CLINICAL INFORMATION:  [place]  Male, 67 years old.    MRI SPINE LUMBOSACRAL WO CONTRAST performed on 06/03/2020 10:27 AM.    REASON FOR EXAM:  M54.16: Lumbar radiculopathy    COMPARISON: Radiographs of the lumbar spine 05/05/2020.    TECHNIQUE: MRI of the lumbosacral spine was performed  without contrast. 3 plane localizer, sagittal T2, sagittal T1, coronal T2, coronal STIR, axial T2 and axial T1 sequences were obtained.    FINDINGS:   Straightening of the lumbar lordosis. Grade 1 anterolisthesis of L4 on L5. Vertebral body heights are normal. Marrow signal is heterogeneous without evidence of an aggressive marrow replacing process. Mild disc height loss is noted at L4-L5.    The conus ends at L1-L2. No clumping or thickening of the cauda equina.     Level by level evaluation is as follows:    T12-L1: No spinal canal or neural foraminal stenosis. There is mild disc bulging and bilateral facet hypertrophy.  L1-L2: No spinal canal or neural foraminal stenosis. Mild disc bulging and bilateral facet hypertrophy.  L2-L3: Disc bulging and moderate ligamentum flavum thickening result in mild spinal canal stenosis. Moderate bilateral facet hypertrophy results in mild bilateral neural foraminal stenosis.    L3-L4: Disc bulging and mild facet hypertrophy result in mild spinal canal stenosis. Posterior endplate osteophytes and moderate facet hypertrophy  result in mild bilateral neural foraminal stenosis.    L4-L5: Diffuse disc bulging and advanced ligamentum flavum thickening results in severe spinal canal stenosis. There is effacement of the bilateral subarticular recesses with contact of the traversing bilateral L5 nerve roots. Advanced bilateral facet hypertrophy results in moderate bilateral neural foraminal stenosis. Far lateral disc bulging results in contact of the exiting bilateral L4 nerve roots.    L5-S1: Diffuse disc bulging and mild ligamentum flavum thickening do not result in spinal canal stenosis. Posterior endplate osteophytes and moderate facet hypertrophy results in moderate bilateral neural foraminal stenosis. Far lateral disc bulging results in contact of the exiting  right L5 nerve root.      Prevertebral soft tissues are normal.        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 There is associated effacement of the subarticular recesses with contact of the traversing bilateral L5 nerve roots.  2.Up to moderate neural foraminal stenosis of the bilateral L4-L5 and L5-S1 levels.  3.Far lateral disc bulging contacts the exiting bilateral L4 and exiting right L5 nerve roots.                ASSESSMENT    ICD-10-CM    1. Hip pain, unspecified laterality  M25.559 PARAVERTEBRAL FACET INJ LUMBAR/SACRAL W FLUORO 2 LEVELS; 64493, [id number] Prior Auth/Scheduling Procedure     PAIN CL FL FACET INJ LUMBAR SACRAL 2 LEVELS     INJ DIAGNOSTIC/THERAPEUTIC AGENT, PARAVERTEBRAL FACET JOINT LUMBAR/SACRAL,UP TO 3 LEVELS     PARAVERTEBRAL FACET INJ LUMBAR/SACRAL W FLUORO 2 LEVELS; 64493, 94494 Prior Auth/Scheduling Procedure     PAIN CL FL FACET INJ LUMBAR SACRAL 2 LEVELS     INJ DIAGNOSTIC/THERAPEUTIC AGENT, PARAVERTEBRAL FACET JOINT LUMBAR/SACRAL,UP TO 3 LEVELS      2. Low back pain, unspecified back pain laterality, unspecified chronicity, unspecified whether sciatica present  M54.50 PARAVERTEBRAL  FACET INJ LUMBAR/SACRAL W FLUORO 2 LEVELS; 64493, 94494 Prior Auth/Scheduling Procedure     PAIN CL FL FACET INJ LUMBAR SACRAL 2 LEVELS     INJ DIAGNOSTIC/THERAPEUTIC AGENT, PARAVERTEBRAL FACET JOINT LUMBAR/SACRAL,UP TO 3 LEVELS     PARAVERTEBRAL FACET INJ LUMBAR/SACRAL W FLUORO 2 LEVELS; 64493, 94494 Prior Auth/Scheduling Procedure     PAIN CL FL FACET INJ LUMBAR SACRAL 2 LEVELS     INJ DIAGNOSTIC/THERAPEUTIC AGENT, PARAVERTEBRAL FACET JOINT LUMBAR/SACRAL,UP TO 3 LEVELS      3. Lumbar spondylosis  M47.816 PARAVERTEBRAL FACET INJ LUMBAR/SACRAL W FLUORO 2 LEVELS; 64493, [id number] Prior Auth/Scheduling Procedure     PAIN CL FL FACET INJ LUMBAR SACRAL 2 LEVELS     INJ DIAGNOSTIC/THERAPEUTIC AGENT, PARAVERTEBRAL FACET JOINT LUMBAR/SACRAL,UP TO 3 LEVELS     PARAVERTEBRAL FACET INJ LUMBAR/SACRAL W FLUORO 2 LEVELS; 64493, 94494 Prior Auth/Scheduling Procedure     PAIN CL FL FACET INJ LUMBAR SACRAL 2 LEVELS     INJ DIAGNOSTIC/THERAPEUTIC AGENT, PARAVERTEBRAL FACET JOINT LUMBAR/SACRAL,UP TO 3 LEVELS            PLAN/RECOMMENDATION  Orders Placed This Encounter    INJ DIAGNOSTIC/THERAPEUTIC AGENT, PARAVERTEBRAL FACET JOINT LUMBAR/SACRAL,UP TO 3 LEVELS    INJ DIAGNOSTIC/THERAPEUTIC AGENT, PARAVERTEBRAL FACET JOINT LUMBAR/SACRAL,UP TO 3 LEVELS    PAIN CL FL FACET INJ LUMBAR SACRAL 2 LEVELS    PAIN CL FL FACET INJ LUMBAR SACRAL 2 LEVELS    PARAVERTEBRAL FACET INJ LUMBAR/SACRAL W FLUORO 2 LEVELS; 64493, 94494 Prior Auth/Scheduling Procedure    PARAVERTEBRAL FACET INJ LUMBAR/SACRAL W FLUORO 2 LEVELS; [id number], [id number] Prior Auth/Scheduling Procedure       1. Right then Left L4-S1 LMBB #2 with Dr. Kennon Rounds.    Patient is going to wait to schedule until he speaks with Orthopedics. Discussed Ortho may require he wait on injections until after his hip surgery. Discussed for injections he cannot have any infection or be on abx and no open wounds. Pt confirmed understanding. Not on AC. No implantable devices.   2. Continue back HEP.   3. Follow up  after injections. If successful, proceed to bilateral RFA.   4. Patient informed of red flags symptoms like caudal anesthesia,  bowel, bladder incontinence including the inability to urinate, weakness of legs that they need to go to the ED emergently for possible impingement of the spinal nerve roots and may need urgent decompression.  Patient confirmed understanding.     Our impression, treatment recommendations and plan from today's visit were reviewed in detail with the patient in the office. All of the patient's questions were answered. If a procedure was ordered, it was explained using a spine model,  including technique, benefits, alternatives and the associated risks.   The patient verbalized understanding of the above plan and the patient wishes to move forward with the above noted plan.    Richrd Prime, FNP-C 04/21/2022, 10:50

## 2022-04-21 NOTE — Nursing Note (Signed)
Procedure Follow Up  Henry [name] [facility]  [id number]  Chief Complaint   Patient presents with    Low Back Pain       [place] Pain Rating Scale     On a scale of 0-10, during the past 24 hours, pain has interfered with you usual activity: 6     On a scale of 0-10, during the past 24 hours, pain has interfered with your sleep: 2    On a scale of 0-10, during the past 24 hours, pain has affected your mood: 2     On a scale of 0-10, during the past 24 hours, pain has contributed to your stress: 2     On a scale of 0-10, what is your overall pain Rating: 6      Vitals:    04/21/22 1023   BP: (!) 171/87   Pulse: 65   Resp: 20   Temp: 36.6 C (97.8 F)   SpO2: 96%   Weight: 125 kg (275 lb 2.2 oz)   Height: 1.753 m ('5\' 9"'$ )   PainSc:   6   PainLoc: Back     Body mass index is 40.63 kg/m.    New imaging:    Patient is here today S/ P rt and left lmbb that was done on 03-31-22 and 04-07-22 and reports 90 % relief that lasted for 1 day  [facility], [place]  04/21/2022, 10:25

## 2022-04-28 ENCOUNTER — Telehealth (INDEPENDENT_AMBULATORY_CARE_PROVIDER_SITE_OTHER): Payer: Self-pay | Admitting: Neurological Surgery

## 2022-04-28 ENCOUNTER — Encounter (INDEPENDENT_AMBULATORY_CARE_PROVIDER_SITE_OTHER): Payer: Self-pay

## 2022-04-28 ENCOUNTER — Ambulatory Visit (INDEPENDENT_AMBULATORY_CARE_PROVIDER_SITE_OTHER): Payer: Self-pay | Admitting: Rehabilitative and Restorative Service Providers"

## 2022-04-28 ENCOUNTER — Other Ambulatory Visit: Payer: Self-pay

## 2022-04-28 DIAGNOSIS — M545 Low back pain, unspecified: Secondary | ICD-10-CM

## 2022-04-28 NOTE — Telephone Encounter (Signed)
CM left voice message requesting a return phone call to review guidelines. MyChart message sent to PT about Guidelines.    04/28/2022  Danella Sensing, CASE MANAGER

## 2022-04-28 NOTE — Progress Notes (Signed)
[facility]  [place], [place] [address]  [phone]    Name:  Henry [name]  MRN:  [id number]  Date:  04/28/2022  Birthday: [birth year]  Age:  67 y.o.  Gender  male    Subjective:  Chief Complaint   Patient presents with    Back Pain     Pain Level:  "Tight"    Objective:  Palpation:  Hypertonicity in bilateral paraspinals.    Today's Treatment:  Applied Therapeutic Massage, Trigger Point/Neuromuscular Therapy and Myofascial Release to Affected Areas.    Assessment:  Pain Level:  Patient reports feeling "looser"     Plan:  Continue with care as outlined in the treatment plan.    Duration:  20 Min.    Joya Gaskins, [place]

## 2022-05-04 ENCOUNTER — Telehealth (HOSPITAL_BASED_OUTPATIENT_CLINIC_OR_DEPARTMENT_OTHER): Payer: Self-pay

## 2022-05-04 NOTE — Telephone Encounter (Signed)
Pt has a H@P  appt on 4/9 . Pt is at 272 lbs, pt aware to stay below 275lbs, pt ok with that and will see pt at H@P  appt

## 2022-05-07 ENCOUNTER — Ambulatory Visit (INDEPENDENT_AMBULATORY_CARE_PROVIDER_SITE_OTHER): Payer: Self-pay | Admitting: Rehabilitative and Restorative Service Providers"

## 2022-05-07 ENCOUNTER — Other Ambulatory Visit: Payer: Self-pay

## 2022-05-07 ENCOUNTER — Telehealth (INDEPENDENT_AMBULATORY_CARE_PROVIDER_SITE_OTHER): Payer: Self-pay | Admitting: Neurological Surgery

## 2022-05-07 DIAGNOSIS — M545 Low back pain, unspecified: Secondary | ICD-10-CM

## 2022-05-07 NOTE — Progress Notes (Signed)
[facility]  [place], [place] [address]  [phone]    Name:  Henry [name]  MRN:  [id number]  Date:  05/07/2022  Birthday: [birth year]  Age:  67 y.o.  Gender  male    Subjective:  Chief Complaint   Patient presents with    Low Back Pain     Pain Level:  "Tight"    Objective:  Palpation:  Hypertonicity in bilateral paraspinals and hamstrings.    Today's Treatment:  Applied Therapeutic Massage, Trigger Point/Neuromuscular Therapy and Myofascial Release to Affected Areas.    Assessment:  Pain Level:  Patient reports feeling "good".     Plan:  Continue with care as outlined in the treatment plan.    Duration:  20 Min.    Joya Gaskins, MT

## 2022-05-07 NOTE — Telephone Encounter (Signed)
CM contacted PT and went over Non Fasting guidelines for upcoming procedure on 03.22.24 with Dr Kennon Rounds. PT had no further questions at this time Clinic phone number was provided to PT at this time.    05/07/2022  Danella Sensing, CASE MANAGER

## 2022-05-08 ENCOUNTER — Ambulatory Visit: Payer: Medicare Other | Admitting: Neurological Surgery

## 2022-05-08 ENCOUNTER — Encounter (INDEPENDENT_AMBULATORY_CARE_PROVIDER_SITE_OTHER): Payer: Self-pay | Admitting: Neurological Surgery

## 2022-05-08 DIAGNOSIS — M47816 Spondylosis without myelopathy or radiculopathy, lumbar region: Secondary | ICD-10-CM | POA: Insufficient documentation

## 2022-05-08 DIAGNOSIS — M25559 Pain in unspecified hip: Secondary | ICD-10-CM

## 2022-05-08 DIAGNOSIS — M545 Low back pain, unspecified: Secondary | ICD-10-CM | POA: Insufficient documentation

## 2022-05-08 NOTE — Patient Instructions (Addendum)
[facility] MANAGEMENT, [facility]  [address]  [place] [address]  Dept: [phone]  Dept Fax: [phone]  [phone]                                           SPECIAL PROCEDURES                                          DISCHARGE FORM                                               [phone]      Please follow the instructions listed below for your procedures.  If you have questions concerning your procedure, you may call and leave a message.  Messages will be returned by the end of the next business day.  If you have an emergency, proceed to your local Emergency Department.      PROCEDURE: RIGHT LUMBAR MEDIAL BRANCH BLOCK                1.  Do not drive a car or operate machinery today.  2.  It is normal to feel numb at the site of injection for several hours.  3.  No heavy lifting.  4.  Perform your normal daily activities, especially those that usually are bothersome.  5. Please keep a journal of your pain relief on the day of injection.      JOURNAL:  ______________________________________________________________________________________________________________________________________________________________________________________________________________________________________________________________________________________________________________________________________________________________________________________________________________________________________________________________________________________________________________________________________________________________________________________________________________________________________________________    These instructions have been reviewed with the patient and appropriate questions have answers.  Domenick Gong, LPN [id number]

## 2022-05-08 NOTE — Procedures (Unsigned)
PAIN MANAGEMENT, [facility]  [place] [address]  Operated by [place]  Procedure Note    Name: Henry [name] MRN:  [id number]   Date: 05/08/2022 DOB:  06-25-1955 (67 y.o.)         INJ DIAGNOSTIC/THERAPEUTIC AGENT, PARAVERTEBRAL FACET JOINT LUMBAR/SACRAL,UP TO 3 LEVELS    Performed by: Ashok Cordia, MD  Authorized by: Richrd Prime, FNP-C    Number of levels::  2      Ashok Cordia, MD    Lumbar Medial Branch Blocks with Fluoro    Pre Procedure Dx:  lumbar spondylosis    Post Procedure Dx:  lumbar spondylosis    Guidance:  Fluoroscopy    Side:  right    Facets Blocked: L4/5/S1    Skin:  1ml 1% lido    Injectate:  1.36ml 0.25% bupi    Consent and timeout done.  Skin prepped and draped.  Sterility maintained throughout.  Junction of SAP and transverse process at: right L4/5 as well as the sacral ala identified with fluoroscopy.  Skin anesthetized.  Spinal needles advanced until periosteum contacted at the above mentioned sites.  After negative aspiration, injectate administered.  Patient tolerated well.    Ashok Cordia, MD

## 2022-05-08 NOTE — Nursing Note (Signed)
[place] Pain Rating Scale     On a scale of 0-10, during the past 24 hours, pain has interfered with you usual activity: 6     On a scale of 0-10, during the past 24 hours, pain has interfered with your sleep: 1    On a scale of 0-10, during the past 24 hours, pain has affected your mood: 1     On a scale of 0-10, during the past 24 hours, pain has contributed to your stress: 1     On a scale of 0-10, what is your overall pain Rating: 6

## 2022-05-11 ENCOUNTER — Telehealth (INDEPENDENT_AMBULATORY_CARE_PROVIDER_SITE_OTHER): Payer: Self-pay | Admitting: Neurological Surgery

## 2022-05-11 NOTE — Telephone Encounter (Signed)
CM contacted PT and went over Non Fasting guidelines for upcoming procedure on 03.26.24 with Dr Kennon Rounds. PT had no further questions at this time Clinic phone number was provided to PT at this time.    05/11/2022  Danella Sensing, CASE MANAGER

## 2022-05-12 ENCOUNTER — Encounter (INDEPENDENT_AMBULATORY_CARE_PROVIDER_SITE_OTHER): Payer: Self-pay | Admitting: Neurological Surgery

## 2022-05-12 ENCOUNTER — Other Ambulatory Visit: Payer: Self-pay

## 2022-05-12 ENCOUNTER — Ambulatory Visit: Payer: Medicare Other | Admitting: Neurological Surgery

## 2022-05-12 VITALS — Temp 97.2°F | Wt 274.5 lb

## 2022-05-12 DIAGNOSIS — M545 Low back pain, unspecified: Secondary | ICD-10-CM

## 2022-05-12 DIAGNOSIS — M25559 Pain in unspecified hip: Secondary | ICD-10-CM

## 2022-05-12 DIAGNOSIS — M47816 Spondylosis without myelopathy or radiculopathy, lumbar region: Secondary | ICD-10-CM

## 2022-05-12 NOTE — Procedures (Signed)
[facility] MANAGEMENT, [facility]  [place] [address]  Operated by [place]  Procedure Note    Name: Riggen Raba MRN:  [id number]   Date: 05/12/2022 DOB:  [birth year] (67 y.o.)         INJ DIAGNOSTIC/THERAPEUTIC AGENT, PARAVERTEBRAL FACET JOINT LUMBAR/SACRAL,UP TO 3 LEVELS    Performed by: Ashok Cordia, MD  Authorized by: Richrd Prime, FNP-C    Number of levels::  2      Ashok Cordia, MD      Lumbar Medial Branch Blocks with Fluoro    Pre Procedure Dx:  lumbar spondylosis    Post Procedure Dx:  lumbar spondylosis    Guidance:  Fluoroscopy    Side:  left    Facets Blocked: L4/5/S1    Skin:  none    Injectate:  1.79ml 0.25% bupi    Consent and timeout done.  Skin prepped and draped.  Sterility maintained throughout.  Junction of SAP and transverse process at:  left L4/5  as well as the sacral ala identified with fluoroscopy.  Skin anesthetized.  Spinal needles advanced until periosteum contacted at the above mentioned sites.  After negative aspiration, injectate administered.  Patient tolerated well.    Ashok Cordia, MD

## 2022-05-12 NOTE — Patient Instructions (Addendum)
PAIN MANAGEMENT, [facility]  [address]  [place] [address]  Dept: [phone]  Dept Fax: [phone]  [phone]                                           SPECIAL PROCEDURES                                          DISCHARGE FORM                                               [phone]      Please follow the instructions listed below for your procedures.  If you have questions concerning your procedure, you may call and leave a message.  Messages will be returned by the end of the next business day.  If you have an emergency, proceed to your local Emergency Department.      PROCEDURE: Medial Branch Block-LEFT               1.  Do not drive a car or operate machinery today.  2.  It is normal to feel numb at the site of injection for several hours.  3.  No heavy lifting.  4.  Perform your normal daily activities, especially those that usually are bothersome.  5. Please keep a journal of your pain relief on the day of injection.      JOURNAL:  ______________________________________________________________________________________________________________________________________________________________________________________________________________________________________________________________________________________________________________________________________________________________________________________________________________________________________________________________________________________________________________________________________________________________________________________________________________________________________________________    These instructions have been reviewed with the patient and appropriate questions have answers.  Wandalee Ferdinand, LPN [phone] [id number]

## 2022-05-12 NOTE — Nursing Note (Signed)
Pain and Function:     [place] Pain Rating Scale     On a scale of 0-10, during the past 24 hours, pain has interfered with you usual activity:  4    On a scale of 0-10, during the past 24 hours, pain has interfered with your sleep:  0    On a scale of 0-10, during the past 24 hours, pain has affected your mood:   0    On a scale of 0-10, during the past 24 hours, pain has contributed to your stress:   0    On a scale of 0-10, what is your overall pain Rating:   4

## 2022-05-19 ENCOUNTER — Ambulatory Visit (INDEPENDENT_AMBULATORY_CARE_PROVIDER_SITE_OTHER): Payer: Self-pay | Admitting: Rehabilitative and Restorative Service Providers"

## 2022-05-19 ENCOUNTER — Other Ambulatory Visit: Payer: Self-pay

## 2022-05-19 DIAGNOSIS — M545 Low back pain, unspecified: Secondary | ICD-10-CM

## 2022-05-19 NOTE — Progress Notes (Signed)
[facility]  [address] [place], [place] [address]  [phone]    Name:  Henry [name]  MRN:  [id number]  Date:  05/19/2022  Birthday: 10-12-1955  Age:  67 y.o.  Gender  male    Subjective:  Chief Complaint   Patient presents with    Back Pain     Pain Level:  "Tight"    Objective:  Palpation:  Hypertonicity in bilateral paraspinals and hamstrings.    Today's Treatment:  Applied Therapeutic Massage, Trigger Point/Neuromuscular Therapy and Myofascial Release to Affected Areas.    Assessment:  Pain Level:  Patient reports feeling "good>"    Plan:  Continue with care as outlined in the treatment plan.    Duration:  20 Min.    Joya Gaskins, MT

## 2022-05-21 ENCOUNTER — Ambulatory Visit (INDEPENDENT_AMBULATORY_CARE_PROVIDER_SITE_OTHER): Payer: Self-pay | Admitting: Rehabilitative and Restorative Service Providers"

## 2022-05-21 ENCOUNTER — Other Ambulatory Visit: Payer: Self-pay

## 2022-05-21 ENCOUNTER — Encounter (HOSPITAL_BASED_OUTPATIENT_CLINIC_OR_DEPARTMENT_OTHER): Payer: Self-pay

## 2022-05-21 DIAGNOSIS — M545 Low back pain, unspecified: Secondary | ICD-10-CM

## 2022-05-21 NOTE — Progress Notes (Signed)
[facility]  [address]  [place], [place] [address]  [phone]    Name:  Silis Gaetz  MRN:  [id number]  Date:  05/21/2022  Birthday: 1955/03/27  Age:  67 y.o.  Gender  male    Subjective:  Chief Complaint   Patient presents with    Low Back Pain     Pain Level:  "Tight"    Objective:  Palpation:  Hypertonicity in bilateral paraspinals and hamstrings.    Today's Treatment:  Applied Therapeutic Massage, Trigger Point/Neuromuscular Therapy and Myofascial Release to Affected Areas.    Assessment:  Pain Level:  Patient reports feeling "good".     Plan:  Continue with care as outlined in the treatment plan.    Duration:  20 Min.    Primus Bravo, MT

## 2022-05-25 ENCOUNTER — Ambulatory Visit (INDEPENDENT_AMBULATORY_CARE_PROVIDER_SITE_OTHER): Payer: Self-pay | Admitting: Rehabilitative and Restorative Service Providers"

## 2022-05-25 ENCOUNTER — Other Ambulatory Visit: Payer: Self-pay

## 2022-05-25 DIAGNOSIS — M545 Low back pain, unspecified: Secondary | ICD-10-CM

## 2022-05-25 NOTE — H&P (Unsigned)
[facility]  Orthopaedic Medical Optimization Program  History and Physical     Richey, Doolittle, 67 y.o. male Medical Record Number: [id number]   Date of Birth:  06-05-1955 Date of Service:  05/26/2022    Information Obtained from: patient and history reviewed via medical record Surgeon:  Eliott Nine, MD     CHIEF COMPLAINT  Pre-Operative History and Physical Examination for pre operative exam requested by Eliott Nine, MD.    ASSESSMENT & PLAN  Problem List Items Addressed This Visit        Osteoarthritis of the left hip  Pre operative exam  Extremity pain   - Patient has failed conservative therapy, operative management is planned  - Pre-operative labs ordered, pending.   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  - Pain management as per surgical team     - Antibiotic prophylaxis: Ancef  - VTE prophylaxis: Xarelto      Morbid obesity  - Weight goal ultimately 261 lbs  - BMI < 40 for scheduling, this is 275 lbs. He has surpassed that goal today      HTN  - Hold losartan, HCTZ the AM of surgery   - Continue metoprolol      HLD  - Continue Lipitor      Hx DVT  - Recommend DOAC for [facility]     Pre diabetes  - A1C 6.4 12/23, recheck today   - On no medications currently  - SSI inpatient       Patient has been provided the following medication instructions in anticipation of surgery:  Medication Comments documented by Tommy Rainwater, MD on 05/25/2022 at 1116.  The patient was provided with the following medication instructions prior to surgery:    14 days before surgery: hold OTC supplements and Multivitamins (Garlic, Gingko, St. John's Wart, Fish Oil, Omega 3 Fatty Acids).    7 days before surgery: hold Aspirin and any aspirin-containing products (excedrin, alka seltzer).    3 days before surgery: hold NSAIDs/anti-inflammatories (Advil, Aleve, naproxen, Mobic, diclofenac, ibuprofen).    The  morning of surgery: hold hydrochlorothiazide, losartan    Take your metoprolol the morning of surgery     Please call Louanna Raw, RN, with any questions or concerns at [phone] ext [phone].        Orders Placed This Encounter    COMPREHENSIVE METABOLIC PANEL, NON-FASTING    CBC/DIFF    HGA1C (HEMOGLOBIN A1C WITH EST AVG GLUCOSE)    ECG 12-LEAD (UTC ONLY)    TYPE AND SCREEN         Operative Reference  Pain contract on file No    Pt hx of DVT? Yes     Latest doppler date/result: NA   Recommended DVT prophylaxis xarelto    Patient history of No thrombotic events in the past 6 months    Prophylactic antibiotics Ancef    Sleep Apnea Yes -Compliant with CPAP    Current Tobacco use No    Current Drug use No    Current Alcohol use No    Fast track No    Allergy to the following: latex, metal/nickel, iodine, acrylic No      Open wounds/rashes No    Research patient Ineligible for any study at this time.    History of MRSA No    History of Chemo/XRT No  At risk worker No    Recent hospitalization No    Personal or family history of anesthesia problems No          HISTORY OF PRESENT ILLNESS:   Henry [name] is a 67 y.o. male that presents with need for pre-operative history and physical.  He has a history of DJD left  hip requiring left total Hip replacement. He was admitted 09/2021 for abdominal pain believed to be related to cholangitis. Had a sphincterotomy with antibiotics and stent placement. The stent was then removed 11/2021. Recommended he have a cholecystectomy No hospitalizations or abdominal pain since then. No rashes or wounds. No new chest pain, dyspnea, or syncope.     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)     Chronic pain     CPAP (continuous positive airway pressure) dependence     CPAP 9 cm H2O    CPAP  (continuous positive airway pressure) dependence     Deep vein thrombosis (DVT) (CMS HCC)     left leg    Eczema     GERD (gastroesophageal reflux disease)     well controlled on omeprazole    Heartburn     HTN     Hyperlipidemia     Hypertension     Irritable bowel syndrome     Melanoma (CMS HCC)     Morbid obesity with BMI of 40.0-44.9, adult (CMS HCC)     Neck problem     obstructive sleep apnea 01/2010    AHI 53.8    Osteoarthritis of back     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     Past Surgical History:   Procedure Laterality Date    GALLBLADDER SURGERY      HX ANKLE FRACTURE TX Right     HX APPENDECTOMY      HX HIP REPLACEMENT Right 05/22/2013    Per Dr. Graciela Husbands    PB COLONOSCOPY,DIAGNOSTIC       Allergies   Allergen Reactions    Sulfa (Sulfonamides) Rash    Shellfish Containing Products Itching and Swelling     Shrimp - eye swelling, itching     Outpatient Medications Marked as Taking for the 05/26/22 encounter (Office Visit) with Tommy Rainwater, MD   Medication Sig    atorvastatin (LIPITOR) 40 mg Oral Tablet Take 1 Tablet (40 mg total) by mouth Once a day    diclofenac sodium (VOLTAREN) 75 mg Oral Tablet, Delayed Release (E.C.) Take 1 Tablet (75 mg total) by mouth Twice daily    dicyclomine (BENTYL) 20 mg Oral Tablet Take 1 Tablet (20 mg total) by mouth Four times a day    GLUCOS CHOND CPLX ADVANCED ORAL Take 2 Tabs by mouth Once a day Glucosamine-Chondroitin 1500 mg    hydroCHLOROthiazide (HYDRODIURIL) 25 mg Oral Tablet Take 1 Tablet (25 mg total) by mouth Once a day    losartan (COZAAR) 100 mg Oral Tablet Take 1 Tablet (100 mg total) by mouth Once a day    metoprolol tartrate (LOPRESSOR) 50 mg Oral Tablet Take 1 Tablet (50 mg total) by mouth Twice daily  multivitamin Oral Tablet Take 1 Tablet by mouth Once a day    Non-Formulary/Special Preparation (MEDICATION HELP) CBD 2 capsules per day     Omeprazole 20 mg Oral Tablet, Delayed Release  (E.C.) Take by mouth Once a day     Social History     Tobacco Use    Smoking status: Former     Types: Cigars     Quit date: 2012     Years since quitting: 12.2    Smokeless tobacco: Never    Tobacco comments:     quit cigars in 2012   Vaping Use    Vaping status: Never Used   Substance Use Topics    Alcohol use: Yes     Alcohol/week: 2.0 standard drinks of alcohol     Types: 2 Cans of beer per week     Comment: per week    Drug use: No     Comment: capsule     Family Medical History:       Problem Relation (Age of Onset)    Asthma Father    Atherosclerosis Paternal Grandmother    Breast Cancer Maternal Grandmother    COPD Father    Cancer Mother, Father    Coronary Artery Disease Mother    Diabetes Mother    High Cholesterol Mother    Hypertension (High Blood Pressure) Mother    Melanoma Brother    Migraines Mother    Other Maternal Grandfather    Prostate Cancer Paternal Grandfather    Stomach Cancer Maternal Grandmother              ROS:  Review of Systems   Constitutional: Negative for activity change, chills, nightsweats and fever.   HENT: Negative.    Respiratory: Negative for cough, chest tightness, shortness of breath and wheezing.    Cardiovascular: Negative for chest pain and palpitations.   Gastrointestinal: Negative.  Negative for abdominal distention, abdominal pain, constipation and diarrhea.   Genitourinary: Negative for difficulty urinating.   Musculoskeletal: Positive for arthralgias  Skin: Negative.    Neurological: Negative for dizziness and headaches.   Psychiatric/Behavioral: Negative for agitation, confusion and decreased concentration. The patient is not nervous/anxious.        Exam:  BP (!) 161/81   Pulse 67   Temp 36.1 C (97 F) (Temporal)   Ht 1.757 m (5' 9.17")   Wt 123 kg (272 lb 0.8 oz)   BMI 39.97 kg/m       Body mass index is 39.97 kg/m.  Physical Exam  Vitals reviewed.   Constitutional:       Appearance: Normal appearance. He is not ill-appearing.   HENT:      Head:  Normocephalic and atraumatic.      Right Ear: External ear normal.      Left Ear: External ear normal.      Nose: Nose normal. No rhinorrhea.      Mouth/Throat:      Mouth: Mucous membranes are moist.      Pharynx: Oropharynx is clear.   Eyes:      Conjunctiva/sclera: Conjunctivae normal.      Pupils: Pupils are equal, round, and reactive to light.   Cardiovascular:      Rate and Rhythm: Normal rate and regular rhythm.      Pulses: Normal pulses.      Heart sounds: Normal heart sounds. No murmur heard.  Pulmonary:      Effort: Pulmonary effort is normal. No respiratory distress.  Breath sounds: No wheezing.   Abdominal:      General: Abdomen is flat. There is no distension.   Musculoskeletal:      Right lower leg: No edema.      Left lower leg: No edema.   Skin:     General: Skin is warm.      Capillary Refill: Capillary refill takes less than 2 seconds.      Coloration: Skin is not pale.      Findings: No bruising, lesion or rash.   Neurological:      General: No focal deficit present.      Mental Status: He is alert and oriented to person, place, and time. Mental status is at baseline.      Gait: Gait abnormal.   Psychiatric:         Thought Content: Thought content normal.         Judgment: Judgment normal.       Diagnostics:  BMP   Last BMP  (Last result in the past 2 years)        Na   K   Cl   CO2   BUN   Cr   Calcium   Glucose   Glucose-Fasting        01/27/22 0840 139   4.3   104   26   15   0.86   9.3  Comment: Gadolinium-containing contrast can interfere with calcium measurement.   136                    LFTs   Lab Results   Component Value Date    AST 26 01/27/2022    ALT 42 01/27/2022    ALKPHOS 90 01/27/2022    TOTBILIRUBIN 0.5 01/27/2022    BILIRUBINCON 4.1 (H) 09/17/2021    TOTALPROTEIN 7.0 01/27/2022    ALBUMIN 3.9 01/27/2022   No results for input(s): "PREALBUMIN", "LIPASE", "UROBILINOGEN", "GAMMAGT", "LDH", "AMYLASE", "AMMONIA" in the last 72 hours.     CBC w diff   Last CBC  (Last result in  the past 2 years)        WBC   HGB   HCT   MCV   Platelets      01/27/22 0840 6.8   14.5   42.3   83.1   248                      Coagulation Studies   Lab Results   Component Value Date    PROTHROMTME 10.8 01/27/2022    INR 0.93 01/27/2022         Inflammatory markers   No results found for: "AESR", "CREAPROINFLA"     IMAGING:  I have personally reviewed the following reports:    XR hip series 4/23      EKG:   Normal sinus rhythm  Normal ECG  When compared with ECG of 31-Mar-2021 16:28,  No significant change was found  Confirmed by John Giovanniaccamo, Marco ([id number]) on 09/16/2021 7:20:01 AM          Cardiac testing: None    Physician Notes/Consults: Dr. Jerl MinaSingh        Revised Goldman Cardiac Risk Index:  Of the six independent predictors of major cardiac complications, patient exhibits the following:  None           Total Points: 0    Rate of cardiac death, nonfatal myocardial infarction, and nonfatal cardiac arrest  No risk factors -  0.4 percent (95%CI 0.1-0.8 percent)  This represents the percentage risk of major cardiac complications (cardiac death, non-fatal MI, non-fatal cardiac arrest, post-operative cardiogenic pulmonary edema, and/or complete heart block).    ASA: ASA 3 - Patient with moderate systemic disease with functional limitations    METs: >4      The patient was encouraged to call with questions or a change in health status.  Eliott Nine, MD comments related to surgery may be added after this signature for additional information and surgical plans.    Tommy Rainwater, MD  05/26/22 13:37  Orthopaedic Medical Optimization Program, Department of Orthopaedics  Pediatric Hospitalist, Department of Pediatrics   [facility]  [place]

## 2022-05-25 NOTE — Progress Notes (Signed)
[facility]  [address]  [place], [place] [address]  [phone]    Name:  Henry [name]  MRN:  [id number]  Date:  05/25/2022  Birthday: 05-Sep-1955  Age:  67 y.o.  Gender  male    Subjective:  Chief Complaint   Patient presents with    Low Back Pain     Pain Level:  "Not bad"    Objective:  Palpation:  Hypertonicity in bilateral paraspinals and hamstrings.    Today's Treatment:  Applied Therapeutic Massage, Trigger Point/Neuromuscular Therapy and Myofascial Release to Affected Areas.    Assessment:  Pain Level:  Patient reports feeling "good".     Plan:  Continue with care as outlined in the treatment plan.    Duration:  20 Min.    Primus Bravo, MT

## 2022-05-26 ENCOUNTER — Encounter (INDEPENDENT_AMBULATORY_CARE_PROVIDER_SITE_OTHER): Payer: Self-pay

## 2022-05-26 ENCOUNTER — Inpatient Hospital Stay (HOSPITAL_BASED_OUTPATIENT_CLINIC_OR_DEPARTMENT_OTHER)
Admission: RE | Admit: 2022-05-26 | Discharge: 2022-05-26 | Disposition: A | Discharge status: Home / Self Care | Payer: Medicare Other | Source: Ambulatory Visit

## 2022-05-26 ENCOUNTER — Ambulatory Visit (HOSPITAL_BASED_OUTPATIENT_CLINIC_OR_DEPARTMENT_OTHER): Payer: Self-pay | Admitting: Student in an Organized Health Care Education/Training Program

## 2022-05-26 ENCOUNTER — Ambulatory Visit (HOSPITAL_BASED_OUTPATIENT_CLINIC_OR_DEPARTMENT_OTHER): Payer: Self-pay | Admitting: Internal Medicine

## 2022-05-26 ENCOUNTER — Ambulatory Visit (HOSPITAL_BASED_OUTPATIENT_CLINIC_OR_DEPARTMENT_OTHER): Payer: Self-pay

## 2022-05-26 ENCOUNTER — Ambulatory Visit (HOSPITAL_BASED_OUTPATIENT_CLINIC_OR_DEPARTMENT_OTHER): Payer: Medicare Other

## 2022-05-26 ENCOUNTER — Encounter (HOSPITAL_COMMUNITY): Payer: Self-pay

## 2022-05-26 ENCOUNTER — Encounter (HOSPITAL_BASED_OUTPATIENT_CLINIC_OR_DEPARTMENT_OTHER): Payer: Self-pay | Admitting: Orthopaedic Surgery

## 2022-05-26 ENCOUNTER — Ambulatory Visit
Payer: Medicare Other | Attending: Student in an Organized Health Care Education/Training Program | Admitting: Student in an Organized Health Care Education/Training Program

## 2022-05-26 ENCOUNTER — Encounter (HOSPITAL_BASED_OUTPATIENT_CLINIC_OR_DEPARTMENT_OTHER): Payer: Self-pay | Admitting: Internal Medicine

## 2022-05-26 ENCOUNTER — Ambulatory Visit (HOSPITAL_BASED_OUTPATIENT_CLINIC_OR_DEPARTMENT_OTHER): Payer: Medicare Other | Admitting: Internal Medicine

## 2022-05-26 ENCOUNTER — Other Ambulatory Visit (HOSPITAL_BASED_OUTPATIENT_CLINIC_OR_DEPARTMENT_OTHER): Payer: Medicare Other

## 2022-05-26 ENCOUNTER — Encounter (HOSPITAL_BASED_OUTPATIENT_CLINIC_OR_DEPARTMENT_OTHER): Payer: Self-pay

## 2022-05-26 VITALS — BP 161/81 | HR 67 | Temp 97.0°F | Ht 69.17 in | Wt 272.0 lb

## 2022-05-26 DIAGNOSIS — R7303 Prediabetes: Secondary | ICD-10-CM

## 2022-05-26 DIAGNOSIS — Z86718 Personal history of other venous thrombosis and embolism: Secondary | ICD-10-CM

## 2022-05-26 DIAGNOSIS — Z6841 Body Mass Index (BMI) 40.0 and over, adult: Secondary | ICD-10-CM

## 2022-05-26 DIAGNOSIS — M79609 Pain in unspecified limb: Secondary | ICD-10-CM

## 2022-05-26 DIAGNOSIS — M1612 Unilateral primary osteoarthritis, left hip: Secondary | ICD-10-CM

## 2022-05-26 DIAGNOSIS — E785 Hyperlipidemia, unspecified: Secondary | ICD-10-CM

## 2022-05-26 DIAGNOSIS — Z01818 Encounter for other preprocedural examination: Secondary | ICD-10-CM

## 2022-05-26 DIAGNOSIS — I10 Essential (primary) hypertension: Secondary | ICD-10-CM

## 2022-05-26 LAB — COMPREHENSIVE METABOLIC PANEL, NON-FASTING
ALBUMIN: 4.1 g/dL (ref 3.4–4.8)
ALKALINE PHOSPHATASE: 89 U/L (ref 45–115)
ALT (SGPT): 50 U/L (ref 10–55)
ANION GAP: 6 mmol/L (ref 4–13)
AST (SGOT): 32 U/L (ref 8–45)
BILIRUBIN TOTAL: 0.5 mg/dL (ref 0.3–1.3)
BUN/CREA RATIO: 19 (ref 6–22)
BUN: 15 mg/dL (ref 8–25)
CALCIUM: 9.9 mg/dL (ref 8.6–10.3)
CHLORIDE: 101 mmol/L (ref 96–111)
CO2 TOTAL: 29 mmol/L (ref 23–31)
CREATININE: 0.78 mg/dL (ref 0.75–1.35)
ESTIMATED GFR - MALE: >90 mL/min/BSA (ref >=60)
GLUCOSE: 108 mg/dL (ref 65–125)
POTASSIUM: 4.1 mmol/L (ref 3.5–5.1)
PROTEIN TOTAL: 7.2 g/dL (ref 6.0–8.0)
SODIUM: 136 mmol/L (ref 136–145)

## 2022-05-26 LAB — CBC WITH DIFF
BASOPHIL #: <0.1 10*3/uL (ref <=0.20)
BASOPHIL %: 0.5 %
EOSINOPHIL #: 0.28 10*3/uL (ref <=0.50)
EOSINOPHIL %: 3.7 %
HCT: 43.2 % (ref 38.9–52.0)
HGB: 14.7 g/dL (ref 13.4–17.5)
IMMATURE GRANULOCYTE #: <0.1 10*3/uL (ref <0.10)
IMMATURE GRANULOCYTE %: 0.5 % (ref 0.0–1.0)
LYMPHOCYTE #: 2.31 10*3/uL (ref 1.00–4.80)
LYMPHOCYTE %: 30.6 %
MCH: 27.9 pg (ref 26.0–32.0)
MCHC: 34 g/dL (ref 31.0–35.5)
MCV: 82.1 fL (ref 78.0–100.0)
MONOCYTE #: 0.55 10*3/uL (ref 0.20–1.10)
MONOCYTE %: 7.3 %
MPV: 9.3 fL (ref 8.7–12.5)
NEUTROPHIL #: 4.32 10*3/uL (ref 1.50–7.70)
NEUTROPHIL %: 57.4 %
PLATELETS: 267 10*3/uL (ref 150–400)
RBC: 5.26 10*6/uL (ref 4.50–6.10)
RDW-CV: 13.2 % (ref 11.5–15.5)
WBC: 7.5 10*3/uL (ref 3.7–11.0)

## 2022-05-26 LAB — TYPE AND SCREEN
ABO/RH(D): O POS
ANTIBODY SCREEN: NEGATIVE

## 2022-05-26 LAB — HGA1C (HEMOGLOBIN A1C WITH EST AVG GLUCOSE)
ESTIMATED AVERAGE GLUCOSE: 134 mg/dL
HEMOGLOBIN A1C: 6.3 % — ABNORMAL HIGH (ref 4.0–5.6)

## 2022-05-26 NOTE — Progress Notes (Signed)
Total [facility] History and Physical Teaching  HPI:   Henry [name] is a 67 y.o. male [facility] for pre op teaching leading up to L THA with Dr. Graciela Husbands.    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)     Chronic pain     CPAP (continuous positive airway pressure) dependence     CPAP 9 cm H2O    CPAP (continuous positive airway pressure) dependence     Deep vein thrombosis (DVT) (CMS HCC)     left leg    Eczema     GERD (gastroesophageal reflux disease)     well controlled on omeprazole    Heartburn     HTN     Hyperlipidemia     Hypertension     Irritable bowel syndrome     Melanoma (CMS HCC)     Morbid obesity with BMI of 40.0-44.9, adult (CMS HCC)     Neck problem     obstructive sleep apnea 01/2010    AHI 53.8    Osteoarthritis of back     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                            Past Surgical History:   Procedure Laterality Date    GALLBLADDER SURGERY      HX ANKLE FRACTURE TX Right     HX APPENDECTOMY      HX HIP REPLACEMENT Right 05/22/2013    Per Dr. Graciela Husbands    PB COLONOSCOPY,DIAGNOSTIC           Social Hx:  reports that he quit smoking about 12 years ago. His smoking use included cigars. He has never used smokeless tobacco. He reports current alcohol use of about 2.0 standard drinks of alcohol per week. He reports that he does not use drugs.                     Social History     Tobacco Use   Smoking Status Former    Types: Cigars    Quit date: 2012    Years since quitting: 12.2   Smokeless Tobacco Never   Tobacco Comments    quit cigars in 2012                      Pre operative Assessment:   Home environment:  Lives in a house with 2 steps to enter from garage.   Main level set up with walk in shower.  There are steps to basement level with full bath with tub/shower.      Social situation:  Lives with wife    Home Equipment:  FWW, cane, raised toilet and riser    Current Level of Function: antalgic gait with limited/modified activity    Patient Goals: walk with wife, travel    Pt reviewed Playbook for Total Joint Replacement with patient.  Discussed home set up and adaptations to meet post op needs.      Anticipated D/C Needs:   Equipment: none anticipated  Recommendation: home with assistance    Therapist :     Fabio Bering, PT 05/26/2022 11:37  Pager #: [phone]

## 2022-05-26 NOTE — Nursing Note (Addendum)
PRE-OP ORTHOPAEDIC CM ASSESSMENT    Care Management Pre-admission Assessment - Met w patient at [facility] Pre-op Class.    Home Set-up - The patient lives w his wife in a house w 2 STE and stay on the main floor w a walk-in shower.    Equipment - Walker, toilet riser, cane    Planned Therapy - Home w assist    RAPT - 9    Planned DVT Prophylaxis - Xarelto, no PA required.    Planned Medicines - celecoxib, ondansetron, oxycodone. Silverscript, BIN - [id number], Group - [id number], PCN - [id number], Member - [id number], meds do not require a PA.    Pharmacy - [facility] [address]    Transport/Assist - family    Surgeon/Surgery/Date - Dr Graciela Husbands L THA 06/15/22

## 2022-05-26 NOTE — Nursing Note (Signed)
Patient attended pre op education class with their coach.  They have a playbook with my contact information including my pager number. Education material reviewed with patient.   They were encouraged to bring this information with them in their playbooks to surgery.  They were instructed that they would receive 42 tablets of oxycodone for post op pain control and that this medication would not be refilled.  They asked and answered questions appropriately. To determine what will be used for DVT prevention after discussion of PEPPER study

## 2022-05-26 NOTE — Progress Notes (Signed)
[facility]  67 y.o. man  presents today for pre op H & P for planned left total Hip replacement  Date of surgery: 06/15/22.  He has tried all reasonable attempts to manage pain with over the counter pain relievers, prescription NSAIDs, intra-articular injections, and activity modification  He has had to curtail important activities of daily living as well as recreational activities  He rates the pain a 7/10 in the affected joint  The severity of  his condition is to the point that any additional non-surgical care is futile.  The patient has been cleared for surgery by our medical optimization specialists     Review of systems   Denies recent illness or infection  Denies recent hospitalization or visit to the ER  Denies a change in medication  The remainder of the review of systems is negative    Brief Exam:  BP (!) 161/81   Pulse 67   Temp 36.1 C (97 F) (Temporal)   Ht 1.757 m (5' 9.17")   Wt 123 kg (272 lb 0.8 oz)   BMI 39.97 kg/m        Intended surgical site is without blemish  Pedal pulses are Normal   Range of motion left hip: limited secondary to pain and stiffness    Radiographs  Imaging: Today's images of the left hip reveals severe DJD as evidenced by severe joint space narrowing and subchondral sclerosis.    Impression  Primary osteoarthritis of left hip   Nearly class 3 obesity with BMI of 39+    Plan  left total Hip replacement   We made the decision to proceed with surgery today after being cleared for surgery by our OMOP colleagues  Implants: Katrinka Blazing and [employer] and R3  Bearing surface chosen after conferring with patient: metal on poly  Implant fixation chosen after conferring with patient: cementless fixation  Pre op testing per OMOP  Anesthesia discussed with patient: Spinal.  DVT prophylaxis: xarelto.  Prophylactic antibiotic:Ancef.  Tranexamic Acid: Parenterally  Hospital length of stay: 1 night.  Planned disposition: home with family  Post-op  prescriptions were not issued  Blood products: Accept.  The patient will Accept photographs and video at the time of surgery.  Through shared decision making we selected the surgical approach, implant fixation and bearing surface.  The risks of the proposed surgery were reviewed at great length.  Specifically we described the risks of surgery to include the risk of infection, dislocation, bone fracture, limb length inequality, surgical site numbness, nerve or vascular injury, deep vein thrombosis, chronic pain, the need for future revisions, and even the risk of death as a result of things we could not possibly predict like a heart attack, stroke, pneumonia, sepsis and pulmonary embolus. We explained that infection is the number one most common serious post-operative complication that can be so severe as to necessitate multiple surgeries, permanent removal of the prosthesis resulting in a shortened painful limb, or even amputation.  We explained that these complications are relatively rare and sometimes devastating.  We explained that we are obligated to describe the risks and alternative treatments so that he can make an informed decision.  The consent was signed and witnessed and we believe this to be proper informed consent.  Preoperative instructions were reviewed.  See "patient instructions" in the AVS.    The patient was encouraged to call with questions or a change in health status.      This patient was  seen independently in clinic.  Veatrice Kells, PA-C  05/26/2022, 14:29      The patient's condition and plan of care was discussed with me.  The note above was personally reviewed and appended before signature.    Kathee Delton MD  Associate Professor of Orthopaedic Surgery  Ph   [phone]  Fax [phone]  Pager [phone]  05/26/2022 19:08        Cc    Self, Referral  No address on file    Marchelle Gearing, MD  [address]  [place] [address]

## 2022-05-28 DIAGNOSIS — Z01818 Encounter for other preprocedural examination: Secondary | ICD-10-CM

## 2022-05-28 DIAGNOSIS — M1612 Unilateral primary osteoarthritis, left hip: Secondary | ICD-10-CM

## 2022-05-28 DIAGNOSIS — Z6841 Body Mass Index (BMI) 40.0 and over, adult: Secondary | ICD-10-CM

## 2022-05-28 LAB — ECG 12-LEAD (UTC ONLY)
Atrial Rate: 59 {beats}/min
Calculated P Axis: 42 degrees
Calculated R Axis: 31 degrees
Calculated T Axis: 62 degrees
PR Interval: 196 ms
QRS Duration: 92 ms
QT Interval: 412 ms
QTC Calculation: 407 ms
Ventricular rate: 59 {beats}/min

## 2022-05-29 ENCOUNTER — Inpatient Hospital Stay (HOSPITAL_COMMUNITY)
Admission: RE | Admit: 2022-05-29 | Discharge: 2022-05-29 | Disposition: A | Discharge status: Home / Self Care | Payer: Medicare Other | Source: Ambulatory Visit

## 2022-05-29 ENCOUNTER — Encounter (HOSPITAL_COMMUNITY): Payer: Self-pay

## 2022-06-02 ENCOUNTER — Ambulatory Visit (INDEPENDENT_AMBULATORY_CARE_PROVIDER_SITE_OTHER): Payer: Self-pay | Admitting: Rehabilitative and Restorative Service Providers"

## 2022-06-02 ENCOUNTER — Other Ambulatory Visit: Payer: Self-pay

## 2022-06-02 DIAGNOSIS — M545 Low back pain, unspecified: Secondary | ICD-10-CM

## 2022-06-02 NOTE — Progress Notes (Signed)
[facility]  [address]  [place], [place] [address]  [phone]    Name:  Henry [name]  MRN:  [id number]  Date:  06/02/2022  Birthday: 1955/04/15  Age:  67 y.o.  Gender  male    Subjective:  Chief Complaint   Patient presents with    Low Back Pain     Pain Level:  "Tight"    Objective:  Palpation:  Hypertonicity in bilateral paraspinals.    Today's Treatment:  Applied Therapeutic Massage, Trigger Point/Neuromuscular Therapy and Myofascial Release to Affected Areas.    Assessment:  Pain Level:  Patient reports feeling "good".     Plan:  Continue with care as outlined in the treatment plan.    Duration:  20 Min.    Primus Bravo, MT

## 2022-06-03 ENCOUNTER — Other Ambulatory Visit (HOSPITAL_BASED_OUTPATIENT_CLINIC_OR_DEPARTMENT_OTHER): Payer: Self-pay | Admitting: Student in an Organized Health Care Education/Training Program

## 2022-06-03 DIAGNOSIS — I10 Essential (primary) hypertension: Secondary | ICD-10-CM

## 2022-06-09 ENCOUNTER — Other Ambulatory Visit: Payer: Self-pay

## 2022-06-09 ENCOUNTER — Ambulatory Visit (INDEPENDENT_AMBULATORY_CARE_PROVIDER_SITE_OTHER): Payer: Self-pay | Admitting: Rehabilitative and Restorative Service Providers"

## 2022-06-09 DIAGNOSIS — M549 Dorsalgia, unspecified: Secondary | ICD-10-CM

## 2022-06-09 DIAGNOSIS — M545 Low back pain, unspecified: Secondary | ICD-10-CM

## 2022-06-09 NOTE — Progress Notes (Signed)
[facility]IN [facility]  [address]  [place], [place] [address]  [phone]    Name:  Henry [name]  MRN:  [id number]  Date:  06/09/2022  Birthday: September 04, 1955  Age:  67 y.o.  Gender  male    Subjective:  Chief Complaint   Patient presents with    Back Pain     Pain Level:  "Tight"    Objective:  Palpation:  Hypertonicity in bilateral trapezius and hamstrings.    Today's Treatment:  Applied Therapeutic Massage, Trigger Point/Neuromuscular Therapy and Myofascial Release to Affected Areas.    Assessment:  Pain Level:  Patient reports feeling "good".     Plan:  Continue with care as outlined in the treatment plan.    Duration:  20 Min.    Primus Bravo, MT

## 2022-06-12 ENCOUNTER — Encounter (HOSPITAL_BASED_OUTPATIENT_CLINIC_OR_DEPARTMENT_OTHER): Payer: Self-pay | Admitting: Orthopaedic Surgery

## 2022-06-14 ENCOUNTER — Encounter (HOSPITAL_BASED_OUTPATIENT_CLINIC_OR_DEPARTMENT_OTHER): Payer: Self-pay | Admitting: Orthopaedic Surgery

## 2022-06-14 NOTE — Anesthesia Preprocedure Evaluation (Signed)
ANESTHESIA PRE-OP EVALUATION  Planned Procedure: ARTHROPLASTY HIP TOTAL (Left: Hip)  Review of Systems     anesthesia history negative     patient summary reviewed          Pulmonary   sleep apnea, CPAP and past history of smoking ,  no COPD, no asthma and not a current smoker   Cardiovascular    Hypertension, ECG reviewed, ACE / ARB inhibitor use, hyperlipidemia and DVT (Hx) ,No valvular problems/murmurs, no past MI, no dysrhythmias, no CABG and no cardiac stents,  Exercise Tolerance: > or = 4 METS   ,beta blocker therapy      GI/Hepatic/Renal    GERD, well controlled and liver disease (fatty liver per patient) no acute renal failure and no renal insufficiency        Endo/Other    No reported diabetes but most recent HbA1c is 6.3, osteoarthritis and morbid obesity, no blood dyscrasia, nocoagulation disorder and no drug induced coagulopathy      Neuro/Psych/MS    back abnormality, Neck problems no seizures, no TIA, no CVA       Cancer  CA (hx melanoma and prostate CA both in remission),                       Physical Assessment      Airway       Mallampati: I      Neck ROM: limited  Mouth Opening: good.  Facial hair    No endotracheal tube present      Dental                    Pulmonary    Breath sounds clear to auscultation       Cardiovascular    Rhythm: regular  Rate: Normal       Other findings              Plan  ASA 3     Planned anesthesia type: spinal           PONV Plan:  I plan to administer pharmcologic prophalaxis antiemetics      SLEEP APNEA  Patient is at risk of obstructive sleep apnea and Education provided regarding risk of obstructive sleep apnea        Intravenous induction     Anesthesia issues/risks discussed are: Dental Injuries, Eye /Visual Loss, Post-op Intubation/Ventilation, Post-op Pain Management, Post-op Agitation/Tantrum, Stroke, Aspiration, Difficult Airway, High Neuraxial Block, Spinal Headache, Nerve Injuries, PONV, Post-op Cognitive Dysfunction, Cardiac Events/MI, Intraoperative  Awareness/ Recall, Blood Loss, Sore Throat and Failure of Block.  Anesthetic plan and risks discussed with patient  signed consent obtained      Use of blood products discussed with patient who consented to blood products.      Patient's NPO status is appropriate for Anesthesia.           Plan discussed with attending, physician and resident.                   Studies Labs   Results for orders placed during the hospital encounter of 05/26/22    ECG 12-LEAD (UTC ONLY) 05/28/2022  9:46 AM    Narrative  Sinus bradycardia  Otherwise normal ECG  When compared with ECG of 15-Sep-2021 21:06,  No significant change was found  Preliminary by fellow Merrilyn Puma, Paulina [phone]) on 05/26/2022 9:34:24 PM  Confirmed by Gean Quint ([id number]) on 05/28/2022 9:46:49 AM  CBC  Lab Results   Component Value Date    WBC 7.5 05/26/2022    HGB 14.7 05/26/2022    HCT 43.2 05/26/2022    PLTCNT 267 05/26/2022      CMP  Lab Results   Component Value Date    SODIUM 136 05/26/2022    POTASSIUM 4.1 05/26/2022    CHLORIDE 101 05/26/2022    CO2 29 05/26/2022    BUN 15 05/26/2022    CREATININE 0.78 05/26/2022    GLUCOSENF 120 10/26/2018    GLUCOSEFAST 110 (H) 05/28/2011    ANIONGAP 6 05/26/2022    BUNCRRATIO 19 05/26/2022    GFR >90 05/26/2022    CALCIUM 9.9 05/26/2022    MAGNESIUM 2.5 09/17/2021     LFTs  Lab Results   Component Value Date    AST 32 05/26/2022    ALT 50 05/26/2022    ALKPHOS 89 05/26/2022    TOTBILIRUBIN 0.5 05/26/2022    BILIRUBINCON 4.1 (H) 09/17/2021    TOTALPROTEIN 7.2 05/26/2022    ALBUMIN 4.1 05/26/2022

## 2022-06-15 ENCOUNTER — Observation Stay
Admission: RE | Admit: 2022-06-15 | Discharge: 2022-06-16 | Disposition: A | Discharge status: Home / Self Care | Payer: Medicare Other | Source: Ambulatory Visit | Attending: Orthopaedic Surgery | Admitting: Orthopaedic Surgery

## 2022-06-15 ENCOUNTER — Observation Stay (HOSPITAL_COMMUNITY): Payer: Medicare Other

## 2022-06-15 ENCOUNTER — Observation Stay (HOSPITAL_BASED_OUTPATIENT_CLINIC_OR_DEPARTMENT_OTHER): Payer: Medicare Other

## 2022-06-15 ENCOUNTER — Encounter (HOSPITAL_COMMUNITY): Payer: Self-pay | Admitting: Orthopaedic Surgery

## 2022-06-15 ENCOUNTER — Observation Stay (HOSPITAL_COMMUNITY): Payer: Medicare Other | Admitting: Orthopaedic Surgery

## 2022-06-15 ENCOUNTER — Other Ambulatory Visit: Payer: Self-pay

## 2022-06-15 ENCOUNTER — Encounter (HOSPITAL_COMMUNITY)
Admission: RE | Disposition: A | Discharge status: Home / Self Care | Payer: Self-pay | Source: Ambulatory Visit | Attending: Orthopaedic Surgery

## 2022-06-15 DIAGNOSIS — E7800 Pure hypercholesterolemia, unspecified: Secondary | ICD-10-CM | POA: Diagnosis present

## 2022-06-15 DIAGNOSIS — Z87891 Personal history of nicotine dependence: Secondary | ICD-10-CM | POA: Insufficient documentation

## 2022-06-15 DIAGNOSIS — M169 Osteoarthritis of hip, unspecified: Secondary | ICD-10-CM | POA: Diagnosis present

## 2022-06-15 DIAGNOSIS — Z9981 Dependence on supplemental oxygen: Secondary | ICD-10-CM | POA: Insufficient documentation

## 2022-06-15 DIAGNOSIS — G4733 Obstructive sleep apnea (adult) (pediatric): Secondary | ICD-10-CM | POA: Diagnosis present

## 2022-06-15 DIAGNOSIS — Z86718 Personal history of other venous thrombosis and embolism: Secondary | ICD-10-CM | POA: Insufficient documentation

## 2022-06-15 DIAGNOSIS — M1612 Unilateral primary osteoarthritis, left hip: Principal | ICD-10-CM | POA: Insufficient documentation

## 2022-06-15 DIAGNOSIS — K589 Irritable bowel syndrome without diarrhea: Secondary | ICD-10-CM | POA: Diagnosis present

## 2022-06-15 DIAGNOSIS — I10 Essential (primary) hypertension: Secondary | ICD-10-CM | POA: Diagnosis present

## 2022-06-15 DIAGNOSIS — Z6841 Body Mass Index (BMI) 40.0 and over, adult: Secondary | ICD-10-CM | POA: Insufficient documentation

## 2022-06-15 DIAGNOSIS — Z79899 Other long term (current) drug therapy: Secondary | ICD-10-CM | POA: Insufficient documentation

## 2022-06-15 DIAGNOSIS — Z96642 Presence of left artificial hip joint: Secondary | ICD-10-CM

## 2022-06-15 DIAGNOSIS — K219 Gastro-esophageal reflux disease without esophagitis: Secondary | ICD-10-CM | POA: Diagnosis present

## 2022-06-15 LAB — TYPE AND SCREEN
ABO/RH(D): O POS
ANTIBODY SCREEN: NEGATIVE

## 2022-06-15 LAB — POC BLOOD GLUCOSE (RESULTS)
GLUCOSE, POC: 145 mg/dl — ABNORMAL HIGH (ref 70–105)
GLUCOSE, POC: 250 mg/dl — ABNORMAL HIGH (ref 70–105)

## 2022-06-15 SURGERY — ARTHROPLASTY HIP TOTAL
Anesthesia: Spinal | Site: Hip | Laterality: Left | Wound class: Clean Wound: Uninfected operative wounds in which no inflammation occurred

## 2022-06-15 MED ORDER — MORPHINE 2 MG/ML INTRAVENOUS SYRINGE
2.0000 mg | INJECTION | INTRAVENOUS | Status: DC | PRN
Start: 2022-06-15 — End: 2022-06-16

## 2022-06-15 MED ORDER — SODIUM CHLORIDE 0.9 % IRRIGATION SOLUTION
1000.0000 mL | Status: DC | PRN
Start: 2022-06-15 — End: 2022-06-15
  Administered 2022-06-15: 1000 mL

## 2022-06-15 MED ORDER — SENNOSIDES 8.6 MG-DOCUSATE SODIUM 50 MG TABLET
1.0000 | ORAL_TABLET | Freq: Two times a day (BID) | ORAL | Status: DC
Start: 2022-06-15 — End: 2022-06-16
  Administered 2022-06-15 – 2022-06-16 (×3): 1 via ORAL
  Filled 2022-06-15 (×3): qty 1

## 2022-06-15 MED ORDER — METOPROLOL TARTRATE 12.5 MG HALF TABLET
50.0000 mg | ORAL_TABLET | Freq: Two times a day (BID) | ORAL | Status: DC
Start: 2022-06-15 — End: 2022-06-16
  Administered 2022-06-15 – 2022-06-16 (×3): 50 mg via ORAL
  Filled 2022-06-15 (×3): qty 4

## 2022-06-15 MED ORDER — ONDANSETRON HCL (PF) 4 MG/2 ML INJECTION SOLUTION
4.0000 mg | Freq: Once | INTRAMUSCULAR | Status: DC
Start: 2022-06-15 — End: 2022-06-15

## 2022-06-15 MED ORDER — OXYCODONE 5 MG TABLET
5.0000 mg | ORAL_TABLET | ORAL | Status: DC | PRN
Start: 2022-06-15 — End: 2022-06-16
  Administered 2022-06-16: 5 mg via ORAL
  Filled 2022-06-15: qty 1

## 2022-06-15 MED ORDER — PANTOPRAZOLE 40 MG TABLET,DELAYED RELEASE
40.0000 mg | DELAYED_RELEASE_TABLET | Freq: Every evening | ORAL | Status: DC
Start: 2022-06-15 — End: 2022-06-16
  Administered 2022-06-15: 40 mg via ORAL
  Filled 2022-06-15: qty 1

## 2022-06-15 MED ORDER — SODIUM CHLORIDE 0.9 % IRRIGATION SOLUTION
Freq: Once | Status: DC | PRN
Start: 2022-06-15 — End: 2022-06-15

## 2022-06-15 MED ORDER — MIDAZOLAM 1 MG/ML INJECTION SOLUTION
INTRAMUSCULAR | Status: AC
Start: 2022-06-15 — End: 2022-06-15
  Filled 2022-06-15: qty 2

## 2022-06-15 MED ORDER — ACETAMINOPHEN 325 MG TABLET
650.0000 mg | ORAL_TABLET | Freq: Four times a day (QID) | ORAL | Status: DC
Start: 2022-06-16 — End: 2022-06-16
  Administered 2022-06-15 – 2022-06-16 (×2): 650 mg via ORAL
  Filled 2022-06-15 (×3): qty 2

## 2022-06-15 MED ORDER — RIVAROXABAN 10 MG TABLET
10.0000 mg | ORAL_TABLET | Freq: Every evening | ORAL | Status: DC
Start: 2022-06-16 — End: 2022-06-16
  Filled 2022-06-15: qty 1

## 2022-06-15 MED ORDER — CEFAZOLIN 1 GRAM SOLUTION FOR INJECTION
INTRAMUSCULAR | Status: AC
Start: 2022-06-15 — End: 2022-06-15
  Filled 2022-06-15: qty 10

## 2022-06-15 MED ORDER — PROPOFOL 10 MG/ML INTRAVENOUS EMULSION
INTRAVENOUS | Status: AC
Start: 2022-06-15 — End: 2022-06-15
  Filled 2022-06-15: qty 20

## 2022-06-15 MED ORDER — ONDANSETRON HCL (PF) 4 MG/2 ML INJECTION SOLUTION
4.0000 mg | Freq: Four times a day (QID) | INTRAMUSCULAR | Status: DC | PRN
Start: 2022-06-15 — End: 2022-06-16

## 2022-06-15 MED ORDER — SODIUM CHLORIDE 0.9 % INTRAVENOUS SOLUTION
INTRAVENOUS | Status: DC
Start: 2022-06-15 — End: 2022-06-16

## 2022-06-15 MED ORDER — FENTANYL (PF) 50 MCG/ML INJECTION SOLUTION
INTRAMUSCULAR | Status: AC
Start: 2022-06-15 — End: 2022-06-15
  Filled 2022-06-15: qty 2

## 2022-06-15 MED ORDER — TRANEXAMIC ACID 1,000 MG/100 ML(10 MG/ML)IN SOD CHLOR,ISO IV PIGGYBACK
INJECTION | INTRAVENOUS | Status: AC
Start: 2022-06-15 — End: 2022-06-15
  Filled 2022-06-15: qty 200

## 2022-06-15 MED ORDER — TRANEXAMIC ACID 1,000 MG/100 ML(10 MG/ML)IN SOD CHLOR,ISO IV PIGGYBACK
1000.0000 mg | INJECTION | Freq: Once | INTRAVENOUS | Status: DC
Start: 2022-06-15 — End: 2022-06-15

## 2022-06-15 MED ORDER — ETHYL ALCOHOL 62 % (NOZIN NASAL SANITIZER) NASAL SOLUTION - BULK BOTTLE
3.0000 | Freq: Once | NASAL | Status: AC
Start: 2022-06-15 — End: 2022-06-15
  Administered 2022-06-15: 3 via NASAL

## 2022-06-15 MED ORDER — PHENYLEPHRINE 1 MG/10 ML (100 MCG/ML) IN 0.9 % SOD.CHLORIDE IV SYRINGE
INJECTION | Freq: Once | INTRAVENOUS | Status: DC | PRN
Start: 2022-06-15 — End: 2022-06-15
  Administered 2022-06-15: 100 ug via INTRAVENOUS

## 2022-06-15 MED ORDER — HYDROCHLOROTHIAZIDE 25 MG TABLET
25.0000 mg | ORAL_TABLET | Freq: Every day | ORAL | Status: DC
Start: 2022-06-16 — End: 2022-06-16

## 2022-06-15 MED ORDER — SODIUM CHLORIDE 0.9 % (FLUSH) INJECTION SYRINGE
2.0000 mL | INJECTION | Freq: Three times a day (TID) | INTRAMUSCULAR | Status: DC
Start: 2022-06-15 — End: 2022-06-16
  Administered 2022-06-15 – 2022-06-16 (×2): 2 mL

## 2022-06-15 MED ORDER — SODIUM CHLORIDE 0.9 % (FLUSH) INJECTION SYRINGE
2.0000 mL | INJECTION | INTRAMUSCULAR | Status: DC | PRN
Start: 2022-06-15 — End: 2022-06-16

## 2022-06-15 MED ORDER — LACTULOSE 20 GRAM/30 ML ORAL SOLUTION
30.0000 mL | Freq: Four times a day (QID) | ORAL | Status: DC | PRN
Start: 2022-06-15 — End: 2022-06-16

## 2022-06-15 MED ORDER — LIDOCAINE (PF) 20 MG/ML (2 %) INJECTION SOLUTION
INTRAMUSCULAR | Status: AC
Start: 2022-06-15 — End: 2022-06-15
  Filled 2022-06-15: qty 5

## 2022-06-15 MED ORDER — PROPOFOL 10 MG/ML INTRAVENOUS EMULSION
INTRAVENOUS | Status: AC
Start: 2022-06-15 — End: 2022-06-15
  Filled 2022-06-15: qty 50

## 2022-06-15 MED ORDER — TRANEXAMIC ACID 1000 MG IN NS 100 ML IVPB - ANES
Freq: Once | INTRAVENOUS | Status: DC | PRN
Start: 2022-06-15 — End: 2022-06-15
  Administered 2022-06-15: 1000 mg via INTRAVENOUS

## 2022-06-15 MED ORDER — PROCHLORPERAZINE EDISYLATE 10 MG/2 ML (5 MG/ML) INJECTION SOLUTION
10.0000 mg | Freq: Four times a day (QID) | INTRAMUSCULAR | Status: DC | PRN
Start: 2022-06-15 — End: 2022-06-16

## 2022-06-15 MED ORDER — PROPOFOL 10 MG/ML IV BOLUS
INJECTION | Freq: Once | INTRAVENOUS | Status: DC | PRN
Start: 2022-06-15 — End: 2022-06-15
  Administered 2022-06-15: 20 mg via INTRAVENOUS
  Administered 2022-06-15: 50 mg via INTRAVENOUS

## 2022-06-15 MED ORDER — DEXAMETHASONE SODIUM PHOSPHATE (PF) 10 MG/ML INJECTION SOLUTION
10.0000 mg | Freq: Once | INTRAMUSCULAR | Status: AC
Start: 2022-06-15 — End: 2022-06-15
  Administered 2022-06-15: 4 mg via INTRAVENOUS

## 2022-06-15 MED ORDER — POLYETHYLENE GLYCOL 3350 17 GRAM ORAL POWDER PACKET
17.0000 g | Freq: Every day | ORAL | Status: DC
Start: 2022-06-15 — End: 2022-06-16
  Administered 2022-06-15: 0 g via ORAL
  Administered 2022-06-16: 17 g via ORAL
  Filled 2022-06-15: qty 1

## 2022-06-15 MED ORDER — DIPHENHYDRAMINE 50 MG/ML INJECTION SOLUTION
25.0000 mg | Freq: Four times a day (QID) | INTRAMUSCULAR | Status: DC | PRN
Start: 2022-06-15 — End: 2022-06-16

## 2022-06-15 MED ORDER — DEXTROSE 5% IN WATER (D5W) FLUSH BAG - 250 ML
INTRAVENOUS | Status: DC | PRN
Start: 2022-06-15 — End: 2022-06-16

## 2022-06-15 MED ORDER — LACTATED RINGERS INTRAVENOUS SOLUTION
INTRAVENOUS | Status: AC
Start: 2022-06-15 — End: ?

## 2022-06-15 MED ORDER — SODIUM CHLORIDE 0.9% FLUSH BAG - 250 ML
INTRAVENOUS | Status: DC | PRN
Start: 2022-06-15 — End: 2022-06-16

## 2022-06-15 MED ORDER — ONDANSETRON HCL (PF) 4 MG/2 ML INJECTION SOLUTION
Freq: Once | INTRAMUSCULAR | Status: DC | PRN
Start: 2022-06-15 — End: 2022-06-15
  Administered 2022-06-15: 4 mg via INTRAVENOUS

## 2022-06-15 MED ORDER — CEFAZOLIN 10 GRAM SOLUTION FOR INJECTION
3.0000 g | Freq: Once | INTRAMUSCULAR | Status: DC
Start: 2022-06-15 — End: 2022-06-15
  Filled 2022-06-15: qty 15

## 2022-06-15 MED ORDER — ETHYL ALCOHOL 62 % (NOZIN NASAL SANITIZER) NASAL SOLUTION - BULK BOTTLE
1.0000 | Freq: Three times a day (TID) | NASAL | Status: DC
Start: 2022-06-15 — End: 2022-06-16
  Administered 2022-06-15 – 2022-06-16 (×3): 1 via NASAL

## 2022-06-15 MED ORDER — SODIUM CHLORIDE 0.9% FLUSH BAG - 250 ML
INTRAVENOUS | Status: AC | PRN
Start: 2022-06-15 — End: ?

## 2022-06-15 MED ORDER — LOSARTAN 25 MG TABLET
100.0000 mg | ORAL_TABLET | Freq: Every day | ORAL | Status: DC
Start: 2022-06-16 — End: 2022-06-16

## 2022-06-15 MED ORDER — APREPITANT 40 MG CAPSULE
40.0000 mg | ORAL_CAPSULE | Freq: Once | ORAL | Status: AC
Start: 2022-06-15 — End: 2022-06-15
  Administered 2022-06-15: 40 mg via ORAL
  Filled 2022-06-15: qty 1

## 2022-06-15 MED ORDER — ATORVASTATIN 40 MG TABLET
40.0000 mg | ORAL_TABLET | Freq: Every day | ORAL | Status: DC
Start: 2022-06-15 — End: 2022-06-16
  Administered 2022-06-15 – 2022-06-16 (×2): 40 mg via ORAL
  Filled 2022-06-15 (×2): qty 1

## 2022-06-15 MED ORDER — ONDANSETRON HCL (PF) 4 MG/2 ML INJECTION SOLUTION
INTRAMUSCULAR | Status: AC
Start: 2022-06-15 — End: 2022-06-15
  Filled 2022-06-15: qty 2

## 2022-06-15 MED ORDER — SENNOSIDES 8.6 MG-DOCUSATE SODIUM 50 MG TABLET
1.0000 | ORAL_TABLET | Freq: Two times a day (BID) | ORAL | 0 refills | Status: DC
Start: 2022-06-15 — End: 2022-07-28
  Filled 2022-06-15: qty 60, 30d supply, fill #0

## 2022-06-15 MED ORDER — MORPHINE 4 MG/ML INTRAVENOUS SOLUTION
4.0000 mg | INTRAVENOUS | Status: DC | PRN
Start: 2022-06-15 — End: 2022-06-16
  Administered 2022-06-15: 4 mg via INTRAVENOUS
  Filled 2022-06-15: qty 1

## 2022-06-15 MED ORDER — FENTANYL (PF) 50 MCG/ML INJECTION SOLUTION
25.0000 ug | INTRAMUSCULAR | Status: DC | PRN
Start: 2022-06-15 — End: 2022-06-15
  Administered 2022-06-15 (×2): 25 ug via INTRAVENOUS
  Filled 2022-06-15: qty 2

## 2022-06-15 MED ORDER — ROPIVACAINE (PF) 5 MG/ML (0.5 %) INJECTION SOLUTION
Freq: Once | INTRAMUSCULAR | Status: AC
Start: 2022-06-15 — End: 2022-06-15
  Filled 2022-06-15: qty 60

## 2022-06-15 MED ORDER — DEXAMETHASONE SODIUM PHOSPHATE 10 MG/ML INJECTION SOLUTION
10.0000 mg | Freq: Once | INTRAMUSCULAR | Status: AC
Start: 2022-06-16 — End: 2022-06-16
  Administered 2022-06-16: 10 mg via INTRAVENOUS
  Filled 2022-06-15: qty 10

## 2022-06-15 MED ORDER — SODIUM CHLORIDE 0.9 % (FLUSH) INJECTION SYRINGE
2.0000 mL | INJECTION | Freq: Three times a day (TID) | INTRAMUSCULAR | Status: DC
Start: 2022-06-15 — End: 2022-06-16
  Administered 2022-06-15: 6 mL
  Administered 2022-06-15 – 2022-06-16 (×2): 2 mL

## 2022-06-15 MED ORDER — OXYCODONE 10 MG TABLET
10.0000 mg | ORAL_TABLET | ORAL | Status: DC | PRN
Start: 2022-06-15 — End: 2022-06-16
  Administered 2022-06-15: 10 mg via ORAL
  Filled 2022-06-15: qty 1

## 2022-06-15 MED ORDER — INSULIN LISPRO 100 UNIT/ML SUB-Q SSIP
0.0000 [IU] | INJECTION | Freq: Four times a day (QID) | SUBCUTANEOUS | Status: DC | PRN
Start: 2022-06-15 — End: 2022-06-16

## 2022-06-15 MED ORDER — DEXAMETHASONE SODIUM PHOSPHATE 4 MG/ML INJECTION SOLUTION
INTRAMUSCULAR | Status: AC
Start: 2022-06-15 — End: 2022-06-15
  Filled 2022-06-15: qty 1

## 2022-06-15 MED ORDER — LACTATED RINGERS INTRAVENOUS SOLUTION
INTRAVENOUS | Status: DC
Start: 2022-06-15 — End: 2022-06-15
  Administered 2022-06-15: 0 mL via INTRAVENOUS

## 2022-06-15 MED ORDER — DICYCLOMINE 10 MG CAPSULE
20.0000 mg | ORAL_CAPSULE | Freq: Four times a day (QID) | ORAL | Status: DC
Start: 2022-06-15 — End: 2022-06-16
  Administered 2022-06-15 – 2022-06-16 (×3): 20 mg via ORAL
  Filled 2022-06-15 (×7): qty 2

## 2022-06-15 MED ORDER — PROPOFOL 10 MG/ML INTRAVENOUS EMULSION
INTRAVENOUS | Status: DC | PRN
Start: 2022-06-15 — End: 2022-06-15
  Administered 2022-06-15: 60 ug/kg/min via INTRAVENOUS
  Administered 2022-06-15: 0 ug/kg/min via INTRAVENOUS
  Administered 2022-06-15: 70 ug/kg/min via INTRAVENOUS
  Administered 2022-06-15: 100 ug/kg/min via INTRAVENOUS
  Administered 2022-06-15: 80 ug/kg/min via INTRAVENOUS

## 2022-06-15 MED ORDER — TRANEXAMIC ACID 1,000 MG/100 ML(10 MG/ML)IN SOD CHLOR,ISO IV PIGGYBACK
1000.0000 mg | INJECTION | Freq: Once | INTRAVENOUS | Status: AC
Start: 2022-06-15 — End: 2022-06-15
  Administered 2022-06-15: 1000 mg via INTRAVENOUS

## 2022-06-15 MED ORDER — ACETAMINOPHEN 1,000 MG/100 ML (10 MG/ML) INTRAVENOUS SOLUTION
1000.0000 mg | Freq: Once | INTRAVENOUS | Status: DC
Start: 2022-06-15 — End: 2022-06-15
  Administered 2022-06-15: 0 mg via INTRAVENOUS
  Administered 2022-06-15: 1000 mg via INTRAVENOUS
  Filled 2022-06-15: qty 100

## 2022-06-15 MED ORDER — SODIUM CHLORIDE 0.9 % (FLUSH) INJECTION SYRINGE
2.0000 mL | INJECTION | Freq: Three times a day (TID) | INTRAMUSCULAR | Status: DC
Start: 2022-06-15 — End: 2022-06-16
  Administered 2022-06-15: 2 mL
  Administered 2022-06-15: 6 mL
  Administered 2022-06-16: 2 mL

## 2022-06-15 MED ORDER — DICYCLOMINE 20 MG TABLET
20.0000 mg | ORAL_TABLET | Freq: Four times a day (QID) | ORAL | Status: DC
Start: 2022-06-15 — End: 2022-06-15

## 2022-06-15 MED ORDER — RIVAROXABAN 10 MG TABLET
10.0000 mg | ORAL_TABLET | Freq: Every day | ORAL | 0 refills | Status: DC
Start: 2022-06-15 — End: 2022-07-28
  Filled 2022-06-15: qty 30, 30d supply, fill #0

## 2022-06-15 MED ORDER — CEFAZOLIN 10 GRAM SOLUTION FOR INJECTION
2.0000 g | Freq: Three times a day (TID) | INTRAMUSCULAR | Status: AC
Start: 2022-06-15 — End: 2022-06-16
  Administered 2022-06-15: 2 g via INTRAVENOUS
  Administered 2022-06-15: 0 g via INTRAVENOUS
  Administered 2022-06-16: 2 g via INTRAVENOUS
  Administered 2022-06-16: 0 g via INTRAVENOUS
  Filled 2022-06-15 (×2): qty 10

## 2022-06-15 MED ORDER — FENTANYL (PF) 50 MCG/ML INJECTION SOLUTION
12.5000 ug | INTRAMUSCULAR | Status: DC | PRN
Start: 2022-06-15 — End: 2022-06-15

## 2022-06-15 MED ORDER — GLYCERIN (ADULT) RECTAL SUPPOSITORY
1.0000 | Freq: Every day | RECTAL | Status: DC | PRN
Start: 2022-06-15 — End: 2022-06-16
  Filled 2022-06-15: qty 1

## 2022-06-15 MED ORDER — BISACODYL 5 MG TABLET,DELAYED RELEASE
10.0000 mg | DELAYED_RELEASE_TABLET | Freq: Every day | ORAL | Status: DC | PRN
Start: 2022-06-15 — End: 2022-06-16

## 2022-06-15 MED ORDER — CEFAZOLIN 1 GRAM SOLUTION FOR INJECTION
Freq: Once | INTRAMUSCULAR | Status: DC | PRN
Start: 2022-06-15 — End: 2022-06-15
  Administered 2022-06-15 (×2): 3000 mg via INTRAVENOUS

## 2022-06-15 MED ORDER — ONDANSETRON HCL 4 MG TABLET
4.0000 mg | ORAL_TABLET | Freq: Four times a day (QID) | ORAL | 0 refills | Status: DC | PRN
Start: 2022-06-15 — End: 2022-07-28
  Filled 2022-06-15: qty 20, 5d supply, fill #0

## 2022-06-15 MED ORDER — MEPIVACAINE (PF) 15 MG/ML (1.5 %) INJECTION SOLUTION
Freq: Once | INTRAMUSCULAR | Status: DC | PRN
Start: 2022-06-15 — End: 2022-06-15
  Administered 2022-06-15: 3.5 mL

## 2022-06-15 MED ORDER — NALOXONE 0.4 MG/ML INJECTION SOLUTION
0.4000 mg | Freq: Once | INTRAMUSCULAR | Status: DC | PRN
Start: 2022-06-15 — End: 2022-06-16

## 2022-06-15 MED ORDER — OXYCODONE 5 MG TABLET
1.0000 | ORAL_TABLET | ORAL | 0 refills | Status: DC | PRN
Start: 2022-06-15 — End: 2022-07-28
  Filled 2022-06-15: qty 42, 7d supply, fill #0

## 2022-06-15 SURGICAL SUPPLY — 23 items
ADH SKNCLS CYNCRLT EXOFIN HVSC STRL TISS LF  DISP 1G (MED SURG SUPPLIES) ×1 IMPLANT
BAG ICE GEL (MED SURG SUPPLIES) ×2 IMPLANT
BLADE SAW 77.6X11.2MM RECIPROCATE OFST HVDTY SS THK.77MM LONG STRL LF  DISP (SURGICAL CUTTING SUPPLIES) ×1 IMPLANT
BLADE SAW 90X18MM SGTL THK1.27MM STRL (SURGICAL CUTTING SUPPLIES) IMPLANT
BLANKET MDTHR MUL-T-BLNKT PED 64X25IN PLMR FBRC 2 SD COLD 2 FEMALE CONN NWVN WARM NONST LF  DISP CLR (MED SURG SUPPLIES) ×1 IMPLANT
BLANKET MUL-T-BLNKT ADULT 64X25IN COLD CONN WARM NONST LF  REUSE (MED SURG SUPPLIES) ×1 IMPLANT
CONV USE 338605 - PACK SURG T/H ARTHPL NONST DISP LF (CUSTOM TRAYS & PACK) ×1 IMPLANT
COVER POSITION WHT HIPGRIP FBRC SET UPRT (MED SURG SUPPLIES) ×1 IMPLANT
DEVICE CLSR VLOC 180 45CM ABS 2-0 GS-21 37MM GRN (SUTURE/WOUND CLOSURE) ×1 IMPLANT
DEVICE CLSR VLOC 90 58CM PREM RVRS 2 ANG CUT ABS BARB PTRN SURGALLOY 4-0 P-12 3/8 CRC UNDYED (SUTURE/WOUND CLOSURE) ×1 IMPLANT
DRESS 20X10CM HDRCL SHOWERPROOF 3 ACT SIL ALVN GNTL BRDR FOAM (WOUND CARE SUPPLY) ×1 IMPLANT
GOWN SURG XL XLNG LGTH AAMI L4 BRTHBL HKLP CLSR LINT RST STRL AERO CR SMS FILM (DRAPE/PACKS/SHEETS/OR TOWEL) ×1 IMPLANT
HEAD ECHLN 36MM -3MM 12/14 TAPER HIP OXNM FEM STRL LF (IMPLANTS HIP) ×1 IMPLANT
HIP POR FEM OX HD R3 SHELL_R3 XLPE LN 71703318 (IMPLANTS HIP) ×1 IMPLANT
HOOD SRG FLYTE SURGICOOL PLWY STRL LF  DISP (GLOVES AND ACCESSORIES) ×1 IMPLANT
LINER ACET R3 20D 56MM 36MM XLPE HIP STRL (IMPLANTS HIP) ×1 IMPLANT
NEEDLE SPINAL PNK 6IN 18GA QUINCKE LONG LGTH REG WL POLYPROP QUINCKE TIP STRL LF  DISP (MED SURG SUPPLIES) ×1 IMPLANT
PACK SURG T/H ARTHPL NONST DISP LF (CUSTOM TRAYS & PACK) ×1
SHELL ACET 56MM HIP 3 H POLY POR R3 STD STRL (IMPLANTS HIP) ×1 IMPLANT
SOL IRRG 0.9% NACL 3L PLASTIC CONTAINR UROMATIC LF (MEDICATIONS/SOLUTIONS) ×1 IMPLANT
SOL SURG PREP 26ML DRPRP 74% ISPRP 0.7% IOD POVACRYLEX SLF CNTN APPL SKIN STRL PREOP (MED SURG SUPPLIES) ×1 IMPLANT
STEM FEM SYNERGY 160MM PRIM DIST BLT TIP TI POR HIP 14 HI OFST TAPER STRL (IMPLANTS HIP) ×1 IMPLANT
WRAP THRP HIP COLD SMI (MED SURG SUPPLIES) ×1 IMPLANT

## 2022-06-15 NOTE — H&P (Signed)
[facility]  H&P Update Form    Henry [name]    06/14/1955   Encounter Start Date:  06/15/2022   Inpatient Admission Date: 06/15/2022     06/15/2022     STOP: IF H&P IS GREATER THAN 30 DAYS FROM SURGICAL DAY COMPLETE NEW H&P IS REQUIRED.     H & P updated the day of the procedure.  1.  H&P completed within 30 days of surgical procedure and has been reviewed within 24 hours of admission but prior to surgery or a procedure requiring anesthesia services by Dr. Eliott Nine and Dr Ann Maki on 05/26/2022 , the patient has been examined, and no change has occured in the patients condition since the H&P was completed.       Change in medications: No        2.  Patient continues to be appropiate candidate for planned surgical procedure. YES    Femoral, peroneal and tibial nerve function is completely intact.    Pedal pulses are palpable and equal to the other extremity.     Does not appreciate leg length discrepancy       Kathee Delton MD  Associate Professor of Orthopaedic Surgery  Ph   [phone]  Fax [phone]  06/15/2022 08:59

## 2022-06-15 NOTE — Brief Op Note (Signed)
[facility]                                                     BRIEF OPERATIVE NOTE    Patient Name: Henry [name] [facility] Number: [id number]  Date of Service: 06/15/2022  Date of Birth: 02/27/1955      Pre-Operative Diagnosis: Primary osteoarthritis of left hip   Post-Operative Diagnosis: same as above  Procedure(s)/Description:  left total hip arthroplasty  Findings: see operative report     Attending Surgeon: Graciela Husbands, MD  Assistant(s): Marvel Plan, MD; Kaleia Longhi, PA-C    Tourniquet Time: None  Anesthesia Type: Spinal anesthesia  Estimated Blood Loss:  300 ml  Blood Given: None  Fluids Given: Crystalloid  Complications:  None  Wound Class: Clean Wound: Uninfected operative wounds in which no inflammation occurred    Tubes: None  Drains: None  Specimens/ Cultures: None  Implants: [employer] 14HO synergey stem and 56 R3 cup with -3 ball and hi-wall liner           Disposition: PACU - hemodynamically stable.  Condition: stable      Veatrice Kells, PA-C  06/15/2022, 11:56

## 2022-06-15 NOTE — OR Nursing (Signed)
Discussed with patient in pre op about allergy to shellfish and reaction (eye swell and itchy).  Anesthesia was present. I informed him that there is a correlation between shellfish and iodine and that we use iodine intra op topically but can use something else if he would like. I also asked if he's used iodine previously. Patient stated he was unsure if he has had it previously and he agreed to the use of iodine topically intra op. Surgery team and anesthesia aware. Proceeded with administration of iodine intra op.

## 2022-06-15 NOTE — Anesthesia Transfer of Care (Signed)
ANESTHESIA TRANSFER OF CARE   Henry [name] is a 67 y.o. 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)     Chronic pain     CPAP (continuous positive airway pressure) dependence     CPAP 9 cm H2O    CPAP (continuous positive airway pressure) dependence     Deep vein thrombosis (DVT) (CMS HCC)     left leg    Eczema     GERD (gastroesophageal reflux disease)     well controlled on omeprazole    Heartburn     HTN     Hyperlipidemia     Hypertension     Irritable bowel syndrome     Melanoma (CMS HCC)     Morbid obesity with BMI of 40.0-44.9, adult (CMS HCC)     Neck problem     obstructive sleep apnea 01/2010    AHI 53.8    Osteoarthritis of back     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      Allergy History as of 06/15/22       SULFA (SULFONAMIDES)         Noted Status Severity Type Reaction    10/25/07 Alvis Lemmings, RN 10/25/07 Active   Rash              SHELLFISH CONTAINING PRODUCTS         Noted Status Severity Type Reaction    05/16/13 1057 Lavena Bullion 04/04/13 Active Low  Itching, Swelling    Comments: Shrimp - eye swelling, itching     04/04/13 1402 Kathaleen Bury, MA 04/04/13 Active       Comments: shrimp                   I completed my transfer of care / handoff to the receiving personnel during which we discussed:  Access, Airway, All key/critical aspects of case discussed, Analgesia, Antibiotics, Expectation of post procedure, Fluids/Product, Gave opportunity for questions and acknowledgement of understanding, Labs and PMHx      Post Location: PACU                        Additional Info:Report  given to PACU RN. All questions answered. Vitals stable.    Wolfgang Phoenix, DO  PGY-1, Anesthesiology                                          Last OR Temp: Temperature: 36 C (96.8 F)  ABG:  POTASSIUM   Date Value Ref Range Status   05/26/2022 4.1 3.5 - 5.1 mmol/L Final   05/23/2013 4.5 3.5 - 5.1 mmol/L Final     KETONES   Date Value Ref Range Status   09/15/2021 Negative Negative mg/dL Final     CALCIUM   Date Value Ref Range  Status   05/26/2022 9.9 8.6 - 10.3 mg/dL Final     Comment:     Gadolinium-containing contrast can interfere with calcium measurement.   05/23/2013 8.6 8.5 - 10.4 mg/dL Final     Calculated P Axis   Date Value Ref Range Status   05/26/2022 42 degrees Final     Calculated R Axis   Date Value Ref Range Status   05/26/2022 31 degrees Final     Calculated T Axis   Date Value Ref Range Status   05/26/2022 62 degrees Final     Airway:* No LDAs found *  Blood pressure 121/87, pulse 67, temperature 36 C (96.8 F), resp. rate 14, height 1.753 m (5\' 9" ), weight 123 kg (271 lb 6.2 oz), SpO2 100%.

## 2022-06-15 NOTE — Anesthesia Postprocedure Evaluation (Signed)
Anesthesia Post Op Evaluation    Patient: Henry [name]  Procedure(s) with comments:  ARTHROPLASTY HIP TOTAL - anesthesia-SAS    Last Vitals:Temperature: 36.5 C (97.7 F) (06/15/22 1330)  Heart Rate: 68 (06/15/22 1330)  BP (Non-Invasive): 130/78 (06/15/22 1400)  Respiratory Rate: 14 (06/15/22 1330)  SpO2: 97 % (06/15/22 1400)    No notable events documented.    Patient is sufficiently recovered from the effects of anesthesia to participate in the evaluation and has returned to their pre-procedure level.  Patient location during evaluation: PACU       Patient participation: complete - patient participated  Level of consciousness: awake and alert and responsive to verbal stimuli    Pain management: adequate  Airway patency: patent    Anesthetic complications: no  Cardiovascular status: acceptable  Respiratory status: acceptable  Hydration status: acceptable  Patient post-procedure temperature: Pt Normothermic   PONV Status: Absent

## 2022-06-15 NOTE — Progress Notes (Signed)
[facility]  Status post left Procedure(s) (LRB):  ARTHROPLASTY HIP TOTAL (Left)     Exam  BP (!) 148/72   Pulse 67   Temp 36.7 C (98.1 F)   Resp 14   Ht 1.753 m (5\' 9" )   Wt 123 kg (271 lb 6.2 oz)   SpO2 97%   BMI 40.08 kg/m       Well appearing  Normal pedal pulses, Foot warm and well perfused, and Spinal still in effect - cannot assess a this time    Labs    Hemogram   Lab Results   Component Value Date/Time    WBC 7.5 05/26/2022 03:10 PM    HGB 14.7 05/26/2022 03:10 PM    HCT 43.2 05/26/2022 03:10 PM    PLTCNT 267 05/26/2022 03:10 PM    RBC 5.26 05/26/2022 03:10 PM    MCV 82.1 05/26/2022 03:10 PM    MCHC 34.0 05/26/2022 03:10 PM    MCH 27.9 05/26/2022 03:10 PM    RDW 13.9 09/10/2016 08:16 AM    MPV 9.3 05/26/2022 03:10 PM        Lab Results   Component Value Date    SODIUM 136 05/26/2022    POTASSIUM 4.1 05/26/2022    CHLORIDE 101 05/26/2022    CO2 29 05/26/2022    ANIONGAP 6 05/26/2022    BUN 15 05/26/2022    CREATININE 0.78 05/26/2022    BUNCRRATIO 19 05/26/2022    GFR >90 05/26/2022    GLUCOSENF 120 10/26/2018    GLUCOSEFAST 110 (H) 05/28/2011        X-rays: looks good     Plan  Weight bearing status: weight bearing as tolerated  Restrictions/precautions: Posterior hip precautions  VTE prophylaxis: Xarelto  Antibiotics: Ancef x 24 hours  Disposition: disposition pending evaluation by PT and OT  Expected length of stay: 1 night    Paticia Stack, MD  06/15/2022, 12:09      The patient's condition and plan of care was discussed with me.  The note above was personally reviewed and appended before signature.    Kathee Delton MD  Associate Professor of Orthopaedic Surgery  Ph   [phone]  Fax [phone]  Pager [phone]  06/15/2022 20:08

## 2022-06-15 NOTE — Care Plan (Signed)
[facility]  Rehabilitation Services  Physical Therapy Initial Evaluation    Patient Name: Henry [name]  Date of Birth: 11-08-1955  Height: Height: 175.3 cm (5' 9.02")  Weight: Weight: 123 kg (271 lb 6.2 oz)  Room/Bed: 14/A  Payor: MEDICARE / Plan: MEDICARE PART A AND B / Product Type: Medicare /     Assessment:      (P) Patient doing well with mobility. Anticipate discharge to home when medically ready.    Discharge Needs:    Equipment Recommendation: (P) none anticipated     Discharge Disposition: (P) home with assist    Plan:   Current Intervention: (P) balance training, bed mobility training, gait training, transfer training, stair training  To provide physical therapy services (P) minimum of 3x/week  for duration of (P) until discharge.    The risks/benefits of therapy have been discussed with the patient/caregiver and he/she is in agreement with the established plan of care.       Subjective & Objective        06/15/22 1805   Therapist Pager   PT Assigned/ Pager # Vilma Prader [phone]   Rehab Session   Document Type evaluation   PT Visit Date 06/15/22   Total PT Minutes: 20   Patient Effort good   Symptoms Noted During/After Treatment none   General Information   Patient Profile Reviewed yes   Onset of Illness/Injury or Date of Surgery 06/15/22   Patient/Family/Caregiver Comments/Observations cooperative   Pertinent History of Current Functional Problem s/p Left THA   Medical Lines PIV Line   Existing Precautions/Restrictions fall precautions;posterior hip precautions;weight bearing restriction   Weight-bearing Status   Extremity Weight-bearing Status left lower extremity   Left Lower Extremity weight-bearing as tolerated (WBAT)   Mutuality/Individual Preferences   Individualized Care Needs ambulate using FWW with assist x 1   Living Environment   Lives With spouse   Living Arrangements house   Home Assessment: No Problems Identified   Home Accessibility stairs to enter home   Home Main Entrance    Number of Stairs, Main Entrance two   Functional Level Prior   Ambulation 0 - independent   Transferring 0 - independent   Pre Treatment Status   Pre Treatment Patient Status Patient supine in bed   Support Present Pre Treatment  Family present   Communication Pre Treatment  Nurse   Cognitive Assessment/Interventions   Behavior/Mood Observations cooperative   Orientation Status oriented x 4   Attention WNL/WFL   Follows Commands WNL   Pain Assessment   Pretreatment Pain Rating 6/10   Posttreatment Pain Rating 7/10   Pain Location - Side Left   Pain Location - Orientation lower   Pain Location extremity   Pre/Posttreatment Pain Comment has been medicated   RLE Assessment   RLE Assessment WFL for stated baseline   LLE Assessment   LLE Assessment X-Exceptions   LLE ROM WFL while maintaining hip precautions   LLE Strength not tested   Bed Mobility Assessment/Treatment   Bed Mobility, Assistive Device bed rails;Head of Bed Elevated   Supine-Sit Independence contact guard assist   Sit to Supine, Independence contact guard assist   Safety Issues decreased use of legs for bridging/pushing   Impairments balance impaired;endurance;pain;ROM decreased;strength decreased   Transfer Assessment/Treatment   Sit-Stand Independence contact guard assist   Stand-Sit Independence contact guard assist   Sit-Stand-Sit, Assist Device walker, front wheeled   Transfer Safety Issues balance decreased during turns;step length decreased;weight-shifting ability decreased  Transfer Impairments balance impaired;endurance;pain;ROM decreased;strength decreased   Gait Assessment/Treatment   Total Distance Ambulated 200   Independence  contact guard assist   Assistive Device  walker, front wheeled   Distance in Feet 200   Deviations  cadence decreased;step length decreased;weight-shifting ability decreased   Maintain Weight Bearing Status able to maintain   Safety Issues  balance decreased during turns;weight-shifting ability decreased;step length  decreased   Impairments  balance impaired;endurance;pain;ROM decreased;strength decreased   Balance Skill Training   Comment FWW in standing   Sitting Balance: Static good balance   Sitting, Dynamic (Balance) good balance   Sit-to-Stand Balance fair - balance   Standing Balance: Static fair balance   Standing Balance: Dynamic fair - balance   Systems Impairment Contributing to Balance Disturbance musculoskeletal   Identified Impairments Contributing to Balance Disturbance pain;decreased ROM;decreased strength   Post Treatment Status   Post Treatment Patient Status Patient supine in bed   Support Present Post Treatment  Family present   Communication Post Treatement Nurse   Plan of Care Review   Plan Of Care Reviewed With patient   Basic Mobility Am-PAC/6Clicks Score (APPROVED Staff)   Turning in bed without bedrails 4   Lying on back to sitting on edge of flat bed 3   Moving to and from a bed to a chair 3   Standing up from chair 3   Walk in room 3   Climbing 3-5 steps with railing 3   6 Clicks Raw Score total 19   Standardized (t-scale) score 42.48   Patient Mobility Goal (JHHLM) 7- Walk 25 feet or more 3X/day   Exercise/Activity Level Performed 7- Walked 25 feet or more   Physical Therapy Clinical Impression   Assessment Patient doing well with mobility. Anticipate discharge to home when medically ready.   Patient/Family Goals Statement none stated   Criteria for Skilled Therapeutic meets criteria;skilled treatment is necessary   Pathology/Pathophysiology Noted musculoskeletal   Impairments Found (describe specific impairments) aerobic capacity/endurance;gait, locomotion, and balance   Functional Limitations in Following  self-care;home management   Disability: Inability to Perform community/leisure   Rehab Potential good   Therapy Frequency minimum of 3x/week   Predicted Duration of Therapy Intervention (days/wks) until discharge   Anticipated Equipment Needs at Discharge (PT) none anticipated   Anticipated  Discharge Disposition home with assist   Care Plan Goals   PT Rehab Goals Gait Training Goal;Transfer Training Goal   Gait Training  Goal, Distance to Achieve   Gait Training  Goal, Date Established 06/15/22   Gait Training  Goal, Time to Achieve by discharge   Gait Training  Goal, Independence Level modified independence   Gait Training  Goal, Assist Device walker, rolling   Gait Training  Goal, Distance to Achieve 250   Transfer Training Goal   Transfer Training Goal, Date Established 06/15/22   Transfer Training Goal, Time to Achieve by discharge   Transfer Training Goal, Activity Type sit-to-stand/stand-to-sit   Transfer Training Goal, Independence Level modified independence   Transfer Training Goal, Assist Device walker, rolling   Planned Therapy Interventions, PT Eval   Planned Therapy Interventions (PT) balance training;bed mobility training;gait training;transfer training;stair training       Therapist:   Vassie Moselle, PT   Pager #: [phone]

## 2022-06-15 NOTE — OR PostOp (Signed)
5N pacu postop  Bladder scan 329  Oral temp 36.5  Postop xrays completed   Post op TXA completed  Dermatone L4

## 2022-06-15 NOTE — Care Plan (Signed)
Goal: Activity - ambulate OOB, PT/OT, ROM  Goal: Safety - OOB with FWW, fall prevention education, call bell within reach  Goal: Infection control - IV abx therapy, hand hygiene, patient education  Goal: Pain control - Reposition, rest  Goal: Skin integrity - encourage frequent weight shift, nutritional intake  Goal: DVT prevention - Ambulation, SCD's, TED hose, xarelto  Goal: D/C - home tomorrow      Patient admitted for L THA. Patient alert and oriented. VSS. Baseline numbness in BLE.  Dressing to L Hip cdi. Patient pain being managed with rest. Patient ambulates OOB with assist of x1 & FWW. Patient tolerating diet well with no c/o nausea. Patient is tolerating IV ABX well. PT/OT recommending home. Patient educated on policies. POC reviewed. Call bell within reach. D/C pending pt/ot in am      Problem: Adult Inpatient Plan of Care  Goal: Plan of Care Review  Outcome: Ongoing (see interventions/notes)  Flowsheets (Taken 06/15/2022 1956)  Progress: no change  Plan of Care Reviewed With: patient  Goal: Patient-Specific Goal (Individualized)  Outcome: Ongoing (see interventions/notes)  Flowsheets (Taken 06/15/2022 1730)  Individualized Care Needs: none stated  Anxieties, Fears or Concerns: none stated  Patient-Specific Goals (Include Timeframe): none stated     Problem: Hip Arthroplasty  Goal: Optimal Coping  Outcome: Ongoing (see interventions/notes)  Goal: Absence of Bleeding  Outcome: Ongoing (see interventions/notes)  Goal: Effective Bowel Elimination  Outcome: Ongoing (see interventions/notes)  Goal: Fluid and Electrolyte Balance  Outcome: Ongoing (see interventions/notes)  Goal: Optimal Functional Ability  Outcome: Ongoing (see interventions/notes)  Goal: Absence of Infection Signs and Symptoms  Outcome: Ongoing (see interventions/notes)  Goal: Intact Neurovascular Status  Outcome: Ongoing (see interventions/notes)  Goal: Anesthesia/Sedation Recovery  Outcome: Ongoing (see interventions/notes)  Goal: Acceptable  Pain Control  Outcome: Ongoing (see interventions/notes)  Goal: Nausea and Vomiting Relief  Outcome: Ongoing (see interventions/notes)  Goal: Effective Urinary Elimination  Outcome: Ongoing (see interventions/notes)  Goal: Effective Oxygenation and Ventilation  Outcome: Ongoing (see interventions/notes)   Rosine Abe, RN

## 2022-06-15 NOTE — Anesthesia Procedure Notes (Addendum)
Henry [name]    Neuraxial Block    Performed by:   Performing Provider: Jerl Santos, MD   Authorizing provider: Drusilla Kanner, MD  I was present and supervised/observed the entire procedure.  Drusilla Kanner, MD 06/15/2022, 14:40  Sedation        Blocks   Block: spinal   Type of block: single shot   Indication:at surgeon's request and primary anesthetic   Diagnosis: hip pain    Requesting surgeon: [name], MD  Technique:  Pt location: In OR  Approach: midline        Preprocedure hand washing was performed sterile field maintained   Needle level: L3-4  Skin Prep: Draped, Sterile technique, Cap, Mask, Sterilely prepped and draped, Sterile field established, Antiseptic wash, Hand hygiene performed, Sterile gloves and Sterile drape   Preanesthesia Checklist:  Pre anesthesia checklist: site marked, surgical consent, monitors and equipment checked, timeout performed, anesthesia consent, emergency drugs available and Pateint positioned  Position:  Positioning: sitting   Skin Local:  Skin local: Lidocaine 1%   Spinal Needle:  Spinal needle:Pencan    Spinal Needle gauge: 24 G    Spinal needle length: 4 inch Spinal needle attempts: 1.  Epidural Needle:                Epidural Catheter:              Block Events:     Test dose:                Medications    Dosing      Patient response adequate sensory block and comfortable.    NOTES

## 2022-06-15 NOTE — Nurses Notes (Signed)
Patient arrive to room 14 s/p L THA. Alert and oriented upon arrival. Oriented to room and call bell system. Patient verbalized understanding and to use call bell for staff assistance to get up. Sitter select placed under patient for safety. Dressing to L Hip clean, dry and intact. Vitals and assessment per flowsheet. Call bell within reach. POC reviewed with patient. Will continue to monitor.

## 2022-06-16 ENCOUNTER — Other Ambulatory Visit: Payer: Self-pay

## 2022-06-16 ENCOUNTER — Telehealth (HOSPITAL_COMMUNITY): Payer: Self-pay

## 2022-06-16 DIAGNOSIS — G4733 Obstructive sleep apnea (adult) (pediatric): Secondary | ICD-10-CM

## 2022-06-16 DIAGNOSIS — E7800 Pure hypercholesterolemia, unspecified: Secondary | ICD-10-CM

## 2022-06-16 DIAGNOSIS — Z4789 Encounter for other orthopedic aftercare: Secondary | ICD-10-CM

## 2022-06-16 DIAGNOSIS — Z96642 Presence of left artificial hip joint: Principal | ICD-10-CM

## 2022-06-16 DIAGNOSIS — K589 Irritable bowel syndrome without diarrhea: Secondary | ICD-10-CM

## 2022-06-16 DIAGNOSIS — I10 Essential (primary) hypertension: Secondary | ICD-10-CM

## 2022-06-16 DIAGNOSIS — K219 Gastro-esophageal reflux disease without esophagitis: Secondary | ICD-10-CM

## 2022-06-16 LAB — CBC
HCT: 36.4 % — ABNORMAL LOW (ref 38.9–52.0)
HGB: 12.2 g/dL — ABNORMAL LOW (ref 13.4–17.5)
MCH: 28.3 pg (ref 26.0–32.0)
MCHC: 33.5 g/dL (ref 31.0–35.5)
MCV: 84.5 fL (ref 78.0–100.0)
MPV: 9 fL (ref 8.7–12.5)
PLATELETS: 218 10*3/uL (ref 150–400)
RBC: 4.31 10*6/uL — ABNORMAL LOW (ref 4.50–6.10)
RDW-CV: 13 % (ref 11.5–15.5)
WBC: 11.2 10*3/uL — ABNORMAL HIGH (ref 3.7–11.0)

## 2022-06-16 LAB — BASIC METABOLIC PANEL
ANION GAP: 9 mmol/L (ref 4–13)
BUN/CREA RATIO: 20 (ref 6–22)
BUN: 15 mg/dL (ref 8–25)
CALCIUM: 8.8 mg/dL (ref 8.6–10.3)
CHLORIDE: 103 mmol/L (ref 96–111)
CO2 TOTAL: 25 mmol/L (ref 23–31)
CREATININE: 0.74 mg/dL — ABNORMAL LOW (ref 0.75–1.35)
ESTIMATED GFR - MALE: >90 mL/min/BSA (ref >=60)
GLUCOSE: 145 mg/dL — ABNORMAL HIGH (ref 65–125)
POTASSIUM: 3.9 mmol/L (ref 3.5–5.1)
SODIUM: 137 mmol/L (ref 136–145)

## 2022-06-16 LAB — POC BLOOD GLUCOSE (RESULTS): GLUCOSE, POC: 162 mg/dl — ABNORMAL HIGH (ref 70–105)

## 2022-06-16 MED ORDER — ETHYL ALCOHOL 62 % (NOZIN NASAL SANITIZER) NASAL SOLUTION - BULK BOTTLE
1.0000 | Freq: Three times a day (TID) | NASAL | Status: DC
Start: 2022-06-16 — End: 2022-07-29

## 2022-06-16 MED ORDER — INSULIN LISPRO 100 UNIT/ML SUB-Q SSIP
0.0000 [IU] | INJECTION | Freq: Four times a day (QID) | SUBCUTANEOUS | Status: DC | PRN
Start: 2022-06-16 — End: 2022-06-16

## 2022-06-16 MED ORDER — MELOXICAM 7.5 MG TABLET
7.5000 mg | ORAL_TABLET | Freq: Every day | ORAL | 0 refills | Status: DC
Start: 2022-06-16 — End: 2022-07-29
  Filled 2022-06-16: qty 30, 30d supply, fill #0

## 2022-06-16 NOTE — Care Plan (Signed)
[facility]  Rehabilitation Services  Occupational Therapy Initial Evaluation    Patient Name: Henry [name]  Date of Birth: 1955-05-31  Height: Height: 175.3 cm (5' 9.02")  Weight: Weight: 123 kg (271 lb 6.2 oz)  Room/Bed: 14/A  Payor: MEDICARE / Plan: MEDICARE PART A AND B / Product Type: Medicare /     Assessment:   [name] tolerated this OT evaluation fairly well. Educated on posterior hip precautions, provided handout and AE; pt demo'd use of AE for LB dressing with SUP. Recommend pt to return home with assist once medically stable for d/c.      Discharge Needs:   Equipment Recommendation: none anticipated    Discharge Disposition: home with assist    JUSTIFICATION OF DISCHARGE RECOMMENDATION   Based on current diagnosis, functional performance prior to admission, and current functional performance, this patient requires continued OT services in home with assist  in order to achieve significant functional improvements.    Plan:   Current Intervention: ADL retraining, balance training, bed mobility training, transfer training    To provide Occupational therapy services 1x/day, minimum of 2x/week, until discharge.       The risks/benefits of therapy have been discussed with the patient/caregiver and he/she is in agreement with the established plan of care.       Subjective & Objective        06/16/22 1191   Therapist Pager   OT Assigned/ Pager # Idrees Quam [phone]   Rehab Session   Document Type evaluation   OT Visit Date 06/16/22   Total OT Minutes: 17   Patient Effort good   Symptoms Noted During/After Treatment none   General Information   Patient Profile Reviewed yes   Onset of Illness/Injury or Date of Surgery 06/15/22   Pertinent History of Current Functional Problem s/p L THA   Medical Lines PIV Line   Respiratory Status room air   Existing Precautions/Restrictions fall precautions;full code;posterior hip precautions;weight bearing restriction  (WBAT LLE)   Pre Treatment Status   Pre Treatment  Patient Status Patient sitting in bedside chair or w/c;Call light within reach;Telephone within reach;Patient safety alarm activated;Nurse approved session   Support Present Pre Treatment  Family present   Communication Pre Treatment  Nurse   Mutuality/Individual Preferences   Individualized Care Needs OOB with FWW and A x1   Living Environment   Lives With spouse   Living Arrangements house   Home Accessibility stairs to enter home   Home Main Entrance   Number of Stairs, Main Entrance two   Functional Level Prior   Ambulation 0 - independent   Transferring 0 - independent   Toileting 0 - independent   Bathing 0 - independent   Dressing 0 - independent   Eating 0 - independent   Prior Functional Level Comment IND PTA   Self-Care   Activity/Exercise/Self-Care Comment owns FWW, raised toilet, cane   Vital Signs   Vitals Comment no s/s of distress   Pain Assessment   Pretreatment Pain Rating 6/10   Posttreatment Pain Rating 6/10   Pain Location - Side Left   Pain Location - Orientation lower   Pain Location extremity   Coping/Psychosocial   Observed Emotional State calm;cooperative   Verbalized Emotional State acceptance   Family/Support System   Family/Support Persons spouse   Involvement in Care at bedside;attentive to patient   Coping/Psychosocial Response Interventions   Plan Of Care Reviewed With patient   Cognitive Assessment/Interventions   Behavior/Mood Observations  behavior appropriate to situation, WNL/WFL   Orientation Status oriented x 4   Attention WNL/WFL   Follows Commands WNL   RUE Assessment   RUE Assessment WFL- Within Functional Limits   LUE Assessment   LUE Assessment WFL- Within Functional Limits   Mobility Assessment/Training   Mobility Comment pt ambulated 241ft using FWW with SBA   Weight-bearing Status   Extremity Weight-bearing Status left lower extremity   Left Lower Extremity weight-bearing as tolerated (WBAT)   Transfer Assessment/Treatment   Sit-Stand Independence supervision required    Stand-Sit Independence supervision required   Sit-Stand-Sit, Assist Device walker, front wheeled   Transfer Safety Issues balance decreased during turns   Transfer Impairments balance impaired;strength decreased;pain   ADL Assessment/Intervention   ADL Comments educated on posterior hip precautions, provided handout and AE   Lower Body Dressing Assessment/Training   Assistive Devices reacher;sock-aid   Position sitting   DRESSING ASSESSED Don Socks;Doff Socks   Independence Level  supervision required;verbal cues required;nonverbal cues required (demo/gesture)   Impairments flexibility decreased;ROM decreased   Grooming/Oral Hygeine  Assessment/Training   Position standing   Independence Level supervision required   Impairments balance impaired   Comment oral care at sink   Balance Skill Training   Comment FWW   Sitting Balance: Static good balance   Sitting, Dynamic (Balance) good balance   Sit-to-Stand Balance fair + balance   Standing Balance: Static fair + balance   Standing Balance: Dynamic fair + balance   Systems Impairment Contributing to Balance Disturbance musculoskeletal   Identified Impairments Contributing to Balance Disturbance impaired coordination;decreased strength;pain   Post Treatment Status   Post Treatment Patient Status Patient sitting in bedside chair or w/c;Telephone within reach;Call light within reach;Patient safety alarm activated;Venodynes in place and activated   Support Present Post Treatment  Family present   Care Plan Goals   OT Rehab Goals Bed Mobility Goal;Transfer Training Goal 2;Toileting Goal;LB Dressing Goal   Bed Mobility Goal   Bed Mobility Goal, Date Established 06/16/22   Bed Mobility Goal, Time to Achieve by discharge   Bed Mobility Goal, Activity Type all bed mobility activities   Bed Mobility Goal, Independence Level modified independence   Bed Mobility Goal, Assistive Device leg lifter   LB Dressing Goal   LB Dressing Goal, Date Established 06/16/22   LB Dressing Goal,  Time to Achieve by discharge   LB Dressing Goal, Activity Type all lower body dressing tasks   LB Dressing Goal, Independence Level independent   Toileting Goal   Toileting Goal, Date Established 06/16/22   Toileting Goal, Time to Achieve by discharge   Toileting Goal, Activity Type all toileting tasks   Toileting Goal, Independence Level independent   Transfer Training Goal 2   Transfer Training Goal, Date Established 06/16/22   Transfer Training Goal, Time to Achieve by discharge   Transfer Training Goal, Activity Type all transfers   Transfer Training Goal, Independence Level modified independence   Transfer Training Goal, Assist Device   (FWW)   Planned Therapy Interventions, OT Eval   Planned Therapy Interventions ADL retraining;balance training;bed mobility training;transfer training   Functional Impairment   Overall Functional Impairments/Problem List balance impaired;pain;strength decreased;ROM decreased   Clinical Impression   Functional Level at Time of Session Mr Kroeker tolerated this OT evaluation fairly well. Educated on posterior hip precautions, provided handout and AE; pt demo'd use of AE for LB dressing with SUP. Recommend pt to return home with assist once medically stable for d/c.  Criteria for Skilled Therapeutic Interventions Met (OT) yes;meets criteria;skilled treatment is necessary   Rehab Potential good   Therapy Frequency 1x/day;minimum of 2x/week   Predicted Duration of Therapy until discharge   Anticipated Equipment Needs at Discharge none anticipated   Anticipated Discharge Disposition home with assist   Highest level of Mobility score   Exercise/Activity Level Performed 7- Walked 25 feet or more   Evaluation Complexity Justification   Occupational Profile Review Expanded review   Performance Deficits 3-5 deficits;Pain;Mobility;Balance;Range of motion;Strength   Clinical Decision Making Moderate analytic complexity   Evaluation Complexity Moderate       Therapist:   Vira Agar, OT    Pager #: [phone]

## 2022-06-16 NOTE — OR Surgeon (Signed)
PATIENT NAME: Henry [name], Henry [name] [facility] NUMBER:  [id number]  DATE OF SERVICE: 06/15/2022  DATE OF BIRTH:  1955-12-24    OPERATIVE REPORT    PREOPERATIVE DIAGNOSES:  1. Severe arthritis left hip.  2. Morbid obesity with a BMI of 40.    POSTOPERATIVE DIAGNOSES:  1. Severe arthritis left hip.  2. Morbid obesity with a BMI of 40.    NAME OF PROCEDURE:  Left total hip replacement, modifier 22 plus 25% due to difficulties associated with the patient's body habitus, size, and weight which added 25% more time and expertise to the operative procedure.    SURGEON:  [name], MD.    ASSISTANTS:  1. Saundra Shelling, MD.  (Fellow physician required for positioning, retraction, technical aspects of the surgical procedure and postop wound closure as no qualified resident physician was available.).  2. Veatrice Kells, PA-C.    ANESTHESIA:   Surgical spinal.    INDICATIONS FOR PROCEDURE:  Henry [name] is a 67 year old gentleman with unremitting left hip pain and moderate to severe arthritis.  He has failed conservative treatment.  He responded well to an intra-articular corticosteroid injection.  He is a good candidate for total hip replacement surgery.  The procedure, well-known risks, and expectations for recovery were reviewed.  Consent for the procedure was provided.    DESCRIPTION OF PROCEDURE:  The patient taken to the operating room on June 15, 2022.  He underwent surgical spinal with bupivacaine.  A preoperative antibiotic was administered.  The patient was then positioned in the lateral decubitus position with the operative hip up.  Extra help was called to the OR to help with positioning due to his size. A time-out was performed to ensure the correct side.  The operative extremity was then degreased with alcohol, prepped with DuraPrep and draped in the sterile fashion.  A timeout was repeated.  A curvilinear incision was created over the lateral hip.  Dissection was carried down to fascia using sharp  dissection.  Due to adiposity, this was a deep hole. Bleeding points were identified and cauterized.  The fascia was then split along the length of the incision.  A self-retaining retractor was inserted.  The short external rotators were detached and tagged for later repair.  The hip capsule was opened and each corner tagged for later repair.  The femoral neck was exposed.  The hip was dislocated.  The femoral neck was osteotomized according to preoperative templating.  An anterior capsulotomy was performed to enhance exposure.  With some difficulty due to his size, the acetabulum was then exposed in a circumferential fashion.  The acetabular labrum was excised.  The acetabular teardrop was identified.  The acetabulum was then sequentially reamed with progressively larger reamers until a hemispherical bed of bleeding bone was encountered.  Satisfied with this, a trial acetabular component was impacted and found to be stable.  The final acetabular implant was selected and impacted into place.  Satisfied with the press-fit fixation, an appropriate-sized highly cross-linked polyethylene bearing was impacted into place and its stability assessed.  Satisfied with the acetabulum, attention was then turned to femoral preparation.  The femoral medullary canal was opened with a box-cutting chisel and T-handled canal finder.  The canal was then sequentially broached until axial and rotational stability was achieved.  Trial reductions were performed with various combinations of neck length and offset.  Once satisfied with stability, range of motion and leg length restoration, trial components were removed.  The final implant  was selected and impacted down to the level of the calcar.  Trial reduction was repeated with various length neck trials.  The final component was selected and inserted onto a clean and dry Morse taper.  The hip was reduced.  The wound was lavaged with pulsatile irrigation.  The fascia was closed with a  running #2 Quill suture and 0 Vicryl suture.  The subcutaneous tissue was approximated with 2-0 Vicryl suture.  The skin was closed with a running 4-0 V-Loc suture followed by Dermabond.  Dressings were applied.  The patient was awakened, extubated, and taken to recovery in satisfactory condition.    ESTIMATED BLOOD LOSS:  400 mL.    COMPLICATIONS:  None.    SPECIMENS:  None.    OPERATIVE FINDINGS:  Severe arthritis left hip as evidenced by bony sclerosis and osteophytosis.    FINAL COMPONENTS IMPLANTED:  Smith and Nephew size 56 R3 acetabular component with a 20-degree high wall polyethylene liner, size 14 high offset Synergy stem with a -3 x 36 oxidized zirconium femoral head.        [name], MD  Associate Professor  [facility] Department of Orthopaedics         Marchelle Gearing, MD  [address] [place], [place] [address]       DD:  06/15/2022 19:12:57  DT:  06/16/2022 01:51:43 MH  D#:  [id number]

## 2022-06-16 NOTE — Telephone Encounter (Signed)
Attempted to call patient to review rivaroxaban education.     Voicemail left with ASP nurse callback information, patient asked to return phone call to review education.         Inocente Salles, RN  06/16/2022, at 17:22  Antithrombotic Stewardship Program  Anticoagulation Education Nurse Specialist  Phone: [phone]

## 2022-06-16 NOTE — Care Management Notes (Signed)
[facility]  Care Management Initial Evaluation    Patient Name: Henry [name] [facility]  Date of Birth: [birth year]  Sex: male  Date/Time of Admission: 06/15/2022  8:07 AM  Room/Bed: 14/A  Payor: MEDICARE / Plan: MEDICARE PART A AND B / Product Type: Medicare /   Primary Care Providers:  Marchelle Gearing, MD, MD (General)    Pharmacy Info:   Preferred Pharmacy       [phone] [address], [place] - [address]    [address] [place] [address]    Phone: [phone] Fax: [phone]    Hours: Not open 24 hours    [facility]    [address] [facility] [place] [address]    Phone: [phone] Fax: [phone]    Hours: 24/7          Emergency Contact Info:   Extended Emergency Contact Information  Primary Emergency Contact: [facility]  Address: [address]           [place], [place] [address] [place]  Work Phone: [phone]  Mobile Phone: [phone]  Relation: Wife    History:   Butler Vegh is a 67 y.o., male, admitted with DJD of hip.    Height/Weight: 175.3 cm (5' 9.02") / 123 kg (271 lb 6.2 oz)     LOS: 0 days   Admitting Diagnosis: Degenerative joint disease (DJD) of hip [M16.9]    Assessment:      06/16/22 0916   Assessment Details   Assessment Type Admission   Date of Care Management Update 06/16/22   Date of Next DCP Update 06/17/22   Readmission   Is this a readmission? No   Insurance Information/Type   Insurance type Medicare   Employment/Financial   Patient has Prescription Coverage?  Yes   Living Environment   Lives With spouse   Living Arrangements house   Able to Return to Prior Arrangements yes   Home Safety   Home Assessment: No Problems Identified   Home Accessibility no concerns;stairs to enter home   Care Management Plan   Discharge Planning Status initial meeting   Projected Discharge Date 06/16/22   Discharge plan discussed with: Patient;Spouse   CM will evaluate for rehabilitation potential yes    Patient choice offered to patient/family no   Form for patient choice reviewed/signed and on chart no   Discharge Needs Assessment   Equipment Currently Used at Home walker, front wheeled;raised toilet;cane, straight;crutches   Equipment Needed After Discharge none   Discharge Facility/Level of Care Needs Home (Patient/Family Member/other)(code 1)   Transportation Available car;family or friend will provide   Referral Information   Admission Type observation   Address Verified verified-no changes   Arrived From home or self-care   Observation Form   Observation Form Given MOON form given   MOON form explained/reviewed with:  Patient   MOON form given to Arnoldo Hildreth   MOON form date of delivery 06/16/22   MOON form time of delivery 0909   ADVANCE DIRECTIVES   Does the Patient have an Advance Directive? Yes, Patient Does Have Advance Directive for Healthcare Treatment   Type of Advance Directive Completed Medical Power of Attorney;Medical Living Will   Copy of Advance Directives in Chart? Yes, Copy on Chart.(Specify in Comment Which Advance Directive)   Name of MPOA or Healthcare Surrogate Tammy Skidgel-spouse   Phone Number of MPOA or Healthcare Surrogate [phone]   Patient Requests Assistance  in Having Advance Directive Notarized. N/A   LAY CAREGIVER    Appointed Lay Caregiver? I Decline   Home Main Entrance   Number of Stairs, Main Entrance two         Discharge Plan:  Home (Patient/Family Member/other) (code 1)  IP consult received for discharge needs. Patient resides with his spouse in their 1 story home with bed/bath on main level, 2 entry steps. No Home Health services prior to admission. MSW confirmed address, pharmacy, and PCP information. Patient has prescription coverage and transportation home upon discharge via spouse.     The patient will continue to be evaluated for developing discharge needs.     Case Manager: [place], LGSW  Phone: [phone]

## 2022-06-16 NOTE — Consults (Signed)
Orthopaedic Medical Optimization Program Consult Note  Requesting provider: No att. providers found  Henry [name] [facility] [id number]  06/16/2022  21:39        Reason for Consult: Procedure(s) (LRB):  ARTHROPLASTY HIP TOTAL (Left)  Assessment/Plan:   Active Hospital Problems   (*Primary Problem)    Diagnosis    *S/P total left hip arthroplasty: VTE and pain management per primary team.    Hypertension: continue losartan, metoprolol and hydrochlorothiazide    GERD (gastroesophageal reflux disease): continue omeprazole    OSA on CPAP: continue CPAP    Hypercholesterolemia: continue atorvastatin    Irritable bowel syndrome: continue dicyclomine prn       HPI: Jaevin is 67 y.o. male who underwent ARTHROPLASTY HIP TOTAL: 21308 (CPT) and is 1 Day Post-Op . Patient denies chest pain, shortness of breath, dyspnea on exertion, or syncope.  Patient has had no palpitations, near-syncope, orthopnea or PND. Pain is adequately controlled.  Patient reports that he is eating and drinking well. He had no abdominal pain.    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    He has a past surgical history that includes pb colonoscopy,diagnostic; hx appendectomy; hx ankle fracture tx (Right); hx hip replacement (Right, 05/22/2013); Gallbladder surgery; and hx ercp.    He reports that he quit smoking about 12 years  ago. His smoking use included cigars. He has never used smokeless tobacco.  He reports current alcohol use of about 2.0 standard drinks of alcohol per week.  He reports no history of drug use.      His family history includes Asthma in his father; Atherosclerosis in his paternal grandmother; Breast Cancer in his maternal grandmother; COPD in his father; Cancer in his father and mother; Coronary Artery Disease in his mother; Diabetes in his mother; High Cholesterol in his mother; Hypertension (High Blood Pressure) in his mother; Melanoma in his brother; Migraines in his mother; Other in his maternal grandfather; Prostate Cancer in his paternal grandfather; Stomach Cancer in his maternal grandmother.  Physical Exam  Temp  Avg: 36.7 C (98 F)  Min: 36.5 C (97.7 F)  Max: 36.7 C (98.1 F)  Pulse  Avg: 69.5  Min: 64  Max: 77 BP  Min: 102/55  Max: 142/67  Resp  Avg: 16.8  Min: 16  Max: 18 SpO2  Avg: 96.8 %  Min: 95 %  Max: 99 %    Vitals reviewed.   General : Alert, In no acute distress, Resting comfortably  HENT : Normocephalic, Atraumatic, Mucosa moist  Cardiovascular : Well Perfused, RRR, No murmur appreciated  Pulmonary: Normal respiratory effort, Clear to auscultation bilaterally  Abdominal/Gastrointestinal: Soft, Non-distended, Non-tender, Bowel sounds normoactive  Extremities: No cyanosis, No edema bilaterally  Integumentary: Warm and dry, No rash  Psychiatric: Normal affect and behavior    I have reviewed the following diagnostics:  BMP:   137 (04/30 0640) 103 (04/30 0640) 15 (04/30 0640)    /         3.9 (04/30  1610) 25 (04/30 9604) 0.74* (04/30 0640) \            CBC:     11.2* (04/30 5409) \   12.2* (04/30 8119) /   218 (04/30 0640)      / 36.4* (04/30 0640) \         Recent Labs     06/15/22  1810 06/15/22  2119 06/16/22  0706   GLUIP 145* 250* 162*      Antihyperglycemics (last 24 hours)       None           VTE Prophylaxis   Anticoagulants/Antiplatelets (last 24 hours)       None          MDM:  All  questions were answered to patient's satisfaction.  I have evaluated, performed and/or ordered the following for MDM: Labs, Medication management, and discussion with primary team.      Falisa Lamora D. Kaysen Sefcik, MD, MAS  06/16/22 21:39  Assistant Professor  Interior and spatial designer, Risk analyst, Clinical Decision Support [facility]  Associate CMIO, [place] [facility]  [facility] [employer]  [place]

## 2022-06-16 NOTE — Pharmacy (Signed)
[facility] / Department of Pharmaceutical Services   Antithrombotic Stewardship Program     New Start Oral Anticoagulant Transitions of Care - Pharmacy     Name: Quavion Boule MRN:  [id number]   Date: 10/24/2021 Age: 67 y.o.       Date of Admission: 06/15/2022    Primary Contact: Patient    Primary Contact Phone Number: [phone]     Alternate Contact:  Tammy Chaudhari - Wife  (Name and Relationship)    Alternate Contact Phone Number: [phone]     Oral Anticoagulant Utilized: rivaroxaban     Prescribed Oral Anticoagulant Regimen: Take 1 Tablet (10 mg total) by mouth Once a day     New initiation of oral anticoagulant? New Start    Insurance Provider: [facility] [employer] [facility]     Prior Authorization Required? No    Medication Cost: $ $119.68(ded)+$105.72 copay) =$225.40    Patient Assistance Required? No    [facility]acy[facility] [place], [place]  [phone]    Despina Arias, Pharmacy Technician  06/16/2022, 12:43

## 2022-06-16 NOTE — Care Plan (Signed)
[facility]  Rehabilitation Services  Physical Therapy Progress Note      Patient Name: Henry [name]  Date of Birth: 25-Nov-1955  Height:  175.3 cm (5' 9.02")  Weight:  123 kg (271 lb 6.2 oz)  Room/Bed: 14/A  Payor: MEDICARE / Plan: MEDICARE PART A AND B / Product Type: Medicare /     Assessment:     (P) Patient doing well with mobility. Anticipate discharge to home when medically ready.    Discharge Needs:   Equipment Recommendation: none anticipated    Discharge Disposition: home with assist    Plan:   Continue to follow patient according to established plan of care.  The risks/benefits of therapy have been discussed with the patient/caregiver and he/she is in agreement with the established plan of care.     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   Medical Lines PIV Line   Existing Precautions/Restrictions fall precautions;posterior hip precautions;weight bearing restriction   Weight-bearing Status   Left Lower Extremity weight-bearing as tolerated (WBAT)   Mutuality/Individual Preferences   Individualized Care Needs ambulate using FWW with supervision   Pre Treatment Status   Pre Treatment Patient Status Patient sitting in bedside chair or w/c   Support Present Pre Treatment  Family present   Communication Pre Treatment  Nurse   Cognitive Assessment/Interventions   Behavior/Mood Observations cooperative   Orientation Status oriented x 4   Attention WNL/WFL   Follows Commands WNL   Pain Assessment   Pretreatment Pain Rating 6/10    Posttreatment Pain Rating 6/10   Pain Location - Side Left   Pain Location - Orientation lower   Pain Location extremity   Pre/Posttreatment Pain Comment has been medicated   Mobility Assessment/Training   Additional Documentation Stairs Assessment/Treatment (Group)   Transfer Assessment/Treatment   Sit-Stand Independence supervision required   Stand-Sit Independence supervision required   Sit-Stand-Sit, Assist Device walker, front wheeled   Transfer Safety Issues balance decreased during turns;step length decreased;weight-shifting ability decreased   Transfer Impairments balance impaired;endurance;pain;ROM decreased;strength decreased   Gait Assessment/Treatment   Total Distance Ambulated 300   Independence  supervision required   Assistive Device  walker, front wheeled   Distance in Feet 300   Deviations  cadence decreased;step length decreased;weight-shifting ability decreased   Maintain Weight Bearing Status able to maintain   Safety Issues  balance decreased during turns;step length decreased;weight-shifting ability decreased   Impairments  balance impaired;endurance;pain;ROM decreased;strength decreased   Stairs Assessment/Treatment   Number of Stairs 3   Impairments balance impaired;endurance;pain;ROM decreased;strength decreased   Handrail Location both sides   Independence Level supervision required   Safety Issues balance decreased during turns;weight-shifting ability decreased   Technique Used step to step (ascending);step to step (descending)   Maintain Weight Bearing Status able to maintain weight bearing status   Balance Skill Training   Comment FWW in standing   Sitting Balance: Static good balance   Sitting, Dynamic (Balance) good balance  Sit-to-Stand Balance fair balance   Standing Balance: Static fair + balance   Standing Balance: Dynamic fair balance   Systems Impairment Contributing to Balance Disturbance musculoskeletal   Identified Impairments Contributing to Balance Disturbance  pain;decreased ROM;decreased strength   Post Treatment Status   Post Treatment Patient Status Patient sitting in bedside chair or w/c   Support Present Post Treatment  Family present   Communication Post Treatement Nurse   Plan of Care Review   Plan Of Care Reviewed With patient   Basic Mobility Am-PAC/6Clicks Score (APPROVED Staff)   Turning in bed without bedrails 4   Lying on back to sitting on edge of flat bed 4   Moving to and from a bed to a chair 4   Standing up from chair 4   Walk in room 4   Climbing 3-5 steps with railing 3   6 Clicks Raw Score total 23   Standardized (t-scale) score 50.88   Patient Mobility Goal (JHHLM) 7- Walk 25 feet or more 3X/day   Exercise/Activity Level Performed 8- Walked 250 feet or more   Physical Therapy Clinical Impression   Assessment Patient doing well with mobility. Anticipate discharge to home when medically ready.   Patient/Family Goals Statement none stated   Criteria for Skilled Therapeutic meets criteria;skilled treatment is necessary   Pathology/Pathophysiology Noted musculoskeletal   Impairments Found (describe specific impairments) aerobic capacity/endurance;gait, locomotion, and balance   Functional Limitations in Following  self-care;home management   Disability: Inability to Perform community/leisure   Rehab Potential good   Therapy Frequency minimum of 3x/week   Predicted Duration of Therapy Intervention (days/wks) until discharge   Anticipated Equipment Needs at Discharge (PT) none anticipated   Anticipated Discharge Disposition home with assist       Therapist:   Vassie Moselle, PT   Pager #: [phone]

## 2022-06-16 NOTE — Nurses Notes (Signed)
Patient to be d/c to home. Educational handouts were provided regarding Oxycodone, Zofran, Meloxicam, and Nozin. AVS reviewed with patient. Patient educated on activity/weight bearing status, diet, incision care/dressing changes, medications, nozin administration, importance of regular BM's, fall prevention, ss of infection, hip precautions, DVT prevention/detection, follow up appointments, and when to seek medical attention. Patient given additional pair of TED hose, dressing and nozin supplies. Patient verbalized understanding with no further questions at this time. PIV removed, catheter intact. Family at bedside for transportation. Prescription medications delivered to room by d/c pharmacy. Patient has all belongings.

## 2022-06-16 NOTE — Progress Notes (Signed)
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 Pain well controlled. Denies cp/sob, n/v. Tolerating a diet.     Exam  Filed Vitals:    06/15/22 1738 06/15/22 2032 06/15/22 2355 06/16/22 0455   BP: (!) 116/59 122/67 (!) 102/55 (!) 111/59   Pulse: 67 80 77 70   Resp: 15 18 17 16    Temp: 36.4 C (97.5 F) 36.7 C (98.1 F) 36.6 C (97.9 F) 36.5 C (97.7 F)   SpO2: 96% 93% 96% 95%     04/28 0700 - 04/29 1859  In: 1880 [P.O.:720; I.V.:800]  Out: 1050 [Urine:800]   General: Well appearing  Breathing: non-labored  Surgical Site: dressing clean, dry and intact  Neurovascular Exam: Normal pedal pulses, Foot warm and well perfused, and Peroneal and tibial nerve intact    Results for orders placed or performed during the hospital encounter of 06/15/22 (from the past 24 hour(s))   TYPE AND SCREEN   Result Value Ref Range    UNITS ORDERED NOT STATED     ABO/RH(D) O POSITIVE     ANTIBODY SCREEN NEGATIVE     SPECIMEN EXPIRATION DATE 06/18/2022,2359    POC BLOOD GLUCOSE (RESULTS)   Result Value Ref Range    GLUCOSE, POC 145 (H) 70 - 105 mg/dl   POC BLOOD GLUCOSE (RESULTS)   Result Value Ref Range    GLUCOSE, POC 250 (H) 70 - 105 mg/dl         Impression:  Active Hospital Problems    Diagnosis    Primary Problem: Degenerative joint disease (DJD) of hip        Plan:  Weight-bearing: Weight bearing as tolerated with a walker or two crutches at all times until further notice  Activity restrictions: Posterior hip precautions  Diet: Regular  Antibiotics (last 24 hours)       Date/Time Action Medication Dose Rate    06/16/22 0049 New Bag/New Syringe    ceFAZolin (ANCEF) 2 g in D5W 50 mL IVPB 2 g 240 mL/hr    06/15/22 1822 New Bag/New Syringe    ceFAZolin (ANCEF) 2 g in D5W 50 mL IVPB 2 g 240 mL/hr           Anticoagulation: Xarelto  Bowel function: passing gas Date of Last  Bowel Movement: 06/15/22   Bladder function: voiding spontaneously  Disposition: disposition pending evaluation by PT and OT  Discharge Readiness: likely today     Paticia Stack, MD  06/16/2022, 06:26      Awaiting am labs  Should be able to go home today  The patient's condition and plan of care was discussed with me.  The note above was personally reviewed and appended before signature.    Kathee Delton MD  Associate Professor of Orthopaedic Surgery  Ph   [phone]  Fax [phone]  Pager [phone]  06/16/2022 06:46

## 2022-06-16 NOTE — Care Plan (Signed)
Goal: Pain control: PRN and scheduled medications, ice, and repositioning.  Goal: Improve Mobility: Patient OOB for meals, ambulate 3 times a day for a total of 150 feet. PT/OT evaluation- home.  Goal: Improve Oxygenation: supplemental oxygen as needed, encourage IS, and deep breathing.  Goal: DVT Prevention: TED hose, SCD, ambulation, and adequate hydration are encouraged and maintained.  Goal: Safety: hourly rounding, and educate on using call bell for assistance before ambulating/toileting.  Goal: Infection Prevention: 24 hours of antibiotics, hand hygiene education, and maintaining a clean environment.      Problem: Adult Inpatient Plan of Care  Goal: Plan of Care Review  Outcome: Ongoing (see interventions/notes)  Goal: Patient-Specific Goal (Individualized)  Outcome: Ongoing (see interventions/notes)  Flowsheets (Taken 06/15/2022 2000)  Anxieties, Fears or Concerns: none voiced  Goal: Absence of Hospital-Acquired Illness or Injury  Outcome: Ongoing (see interventions/notes)  Intervention: Identify and Manage Fall Risk  Recent Flowsheet Documentation  Taken 06/16/2022 0000 by Rush Barer, RN  Safety Promotion/Fall Prevention:   activity supervised   safety round/check completed   toileting scheduled  Taken 06/15/2022 2000 by Rush Barer, RN  Safety Promotion/Fall Prevention:   activity supervised   safety round/check completed   toileting scheduled  Intervention: Prevent Skin Injury  Recent Flowsheet Documentation  Taken 06/15/2022 2000 by Rush Barer, RN  Body Position: supine, head elevated  Intervention: Prevent and Manage VTE (Venous Thromboembolism) Risk  Recent Flowsheet Documentation  Taken 06/16/2022 0400 by Rush Barer, RN  VTE Prevention/Management:   ambulation promoted   dorsiflexion/plantar flexion performed   sequential compression devices on  Taken 06/16/2022 0000 by Rush Barer, RN  VTE Prevention/Management:   ambulation promoted   dorsiflexion/plantar flexion performed   sequential  compression devices on  Taken 06/15/2022 2000 by Rush Barer, RN  VTE Prevention/Management:   ambulation promoted   dorsiflexion/plantar flexion performed   sequential compression devices on  Goal: Optimal Comfort and Wellbeing  Outcome: Ongoing (see interventions/notes)  Goal: Rounds/Family Conference  Outcome: Ongoing (see interventions/notes)     Problem: Hip Arthroplasty  Goal: Optimal Coping  Outcome: Ongoing (see interventions/notes)  Goal: Absence of Bleeding  Outcome: Ongoing (see interventions/notes)  Goal: Effective Bowel Elimination  Outcome: Ongoing (see interventions/notes)  Goal: Fluid and Electrolyte Balance  Outcome: Ongoing (see interventions/notes)  Goal: Optimal Functional Ability  Outcome: Ongoing (see interventions/notes)  Intervention: Promote Optimal Functional Status  Recent Flowsheet Documentation  Taken 06/15/2022 2000 by Rush Barer, RN  Activity Management: patient refused/activity encouraged  Goal: Absence of Infection Signs and Symptoms  Outcome: Ongoing (see interventions/notes)  Goal: Intact Neurovascular Status  Outcome: Ongoing (see interventions/notes)  Goal: Anesthesia/Sedation Recovery  Outcome: Ongoing (see interventions/notes)  Intervention: Optimize Anesthesia Recovery  Recent Flowsheet Documentation  Taken 06/16/2022 0000 by Rush Barer, RN  Safety Promotion/Fall Prevention:   activity supervised   safety round/check completed   toileting scheduled  Taken 06/15/2022 2000 by Rush Barer, RN  Safety Promotion/Fall Prevention:   activity supervised   safety round/check completed   toileting scheduled  Goal: Acceptable Pain Control  Outcome: Ongoing (see interventions/notes)  Goal: Nausea and Vomiting Relief  Outcome: Ongoing (see interventions/notes)  Goal: Effective Urinary Elimination  Outcome: Ongoing (see interventions/notes)  Goal: Effective Oxygenation and Ventilation  Outcome: Ongoing (see interventions/notes)     Problem: Pain Acute  Goal: Optimal Pain Control  and Function  Outcome: Ongoing (see interventions/notes)     Problem: VTE (Venous Thromboembolism)  Goal: VTE (Venous Thromboembolism) Symptom Resolution  Outcome: Ongoing (see interventions/notes)  Intervention:  Prevent or Manage VTE (Venous Thromboembolism)  Recent Flowsheet Documentation  Taken 06/16/2022 0400 by Rush Barer, RN  VTE Prevention/Management:   ambulation promoted   dorsiflexion/plantar flexion performed   sequential compression devices on  Taken 06/16/2022 0000 by Rush Barer, RN  VTE Prevention/Management:   ambulation promoted   dorsiflexion/plantar flexion performed   sequential compression devices on  Taken 06/15/2022 2000 by Rush Barer, RN  VTE Prevention/Management:   ambulation promoted   dorsiflexion/plantar flexion performed   sequential compression devices on

## 2022-06-16 NOTE — Discharge Summary (Signed)
[facility]  DEPARTMENT OF ORTHOPAEDICS  DISCHARGE SUMMARY  DATE OF SERVICE: 06/16/2022    [facility]  [address]  [place], [place] [address]  O: [phone]  F: [phone]      PATIENT NAME:  Henry [name], Henry [name]  MRN:  [id number]  DOB:  1955-12-26    ENCOUNTER DATE:  06/15/2022  INPATIENT ADMISSION DATE:   DISCHARGE DATE:  06/16/2022    ATTENDING PHYSICIAN: [name], MD  SERVICE: [place]  PRIMARY CARE PHYSICIAN: Marchelle Gearing, MD         LAY CAREGIVER:  ,  ,        DISCHARGE MEDICATIONS:     Current Discharge Medication List        START taking these medications.        Details   alcohol 62% Solution  Commonly known as: NOZIN NASAL SANITIZER   1 Each, Each Nostril, EVERY 8 HOURS (SCHEDULED)  Refills: 0     meloxicam 7.5 mg Tablet  Commonly known as: MOBIC   7.5 mg, Oral, DAILY  Qty: 30 Tablet  Refills: 0     ondansetron 4 mg Tablet  Commonly known as: ZOFRAN   4 mg, Oral, EVERY 6 HOURS PRN  Qty: 20 Tablet  Refills: 0     oxyCODONE 5 mg Tablet  Commonly known as: ROXICODONE   5-10 mg, Oral, EVERY 4 HOURS PRN  Qty: 42 Tablet  Refills: 0     Stimulant Laxative Plus 8.6-50 mg Tablet  Generic drug: sennosides-docusate sodium   1 Tablet, Oral, 2 TIMES DAILY  Qty: 60 Tablet  Refills: 0     Xarelto 10 mg Tablet  Generic drug: rivaroxaban   10 mg, Oral, DAILY  Qty: 30 Tablet  Refills: 0            CONTINUE these medications - NO CHANGES were made during your visit.        Details   Acetaminophen 500 mg Capsule   500 mg, Oral, EVERY 6 HOURS PRN  Refills: 0     atorvastatin 40 mg Tablet  Commonly known as: LIPITOR   40 mg, Oral, DAILY  Qty: 90 Tablet  Refills: 3     dicyclomine 20 mg Tablet  Commonly known as: BENTYL   20 mg, Oral, 4 TIMES DAILY  Qty: 360 Tablet  Refills: 5     GLUCOS CHOND CPLX ADVANCED ORAL   2 Tablets, Oral, DAILY, Glucosamine-Chondroitin 1500 mg  Refills: 0     hydroCHLOROthiazide 25 mg Tablet  Commonly known as: HYDRODIURIL   25 mg, Oral,  DAILY  Qty: 90 Tablet  Refills: 4     losartan 100 mg Tablet  Commonly known as: COZAAR   100 mg, Oral, DAILY  Qty: 90 Tablet  Refills: 3     metoprolol tartrate 50 mg Tablet  Commonly known as: LOPRESSOR   50 mg, Oral, 2 TIMES DAILY  Qty: 180 Tablet  Refills: 3     metroNIDAZOLE 1 % Gel with Pump   DAILY  Refills: 0     multivitamin Tablet   1 Tablet, Oral, DAILY  Refills: 0     Non-Formulary/Special Preparation  Commonly known as: MEDICATION HELP   CBD 2 capsules per day  Refills: 0     Omeprazole 20 mg Tablet, Delayed Release (E.C.)   Oral, DAILY, am  Refills: 0     triamcinolone acetonide 0.1 % Cream   Refills: 0  STOP taking these medications.      diclofenac sodium 75 mg Tablet, Delayed Release (E.C.)  Commonly known as: VOLTAREN            Discharge med list refreshed?  YES                ALLERGIES:  Allergies   Allergen Reactions    Sulfa (Sulfonamides) Rash    Shellfish Containing Products Itching and Swelling     Shrimp - eye swelling, itching             HOSPITAL PROCEDURE(S):   Bedside Procedures:  No orders of the defined types were placed in this encounter.    Surgical Procedures:  Procedure(s):  ARTHROPLASTY HIP TOTAL      REASON FOR HOSPITALIZATION AND HOSPITAL COURSE     Diagnosis on admission:  DJD left  hip    Other diagnoses:    Patient Active Problem List    Diagnosis    S/P total left hip arthroplasty    Degenerative joint disease (DJD) of hip    Lower back pain    Hip pain    Varicose veins of left lower extremity    Paresthesia of upper and lower extremities of both sides    Left leg pain    Clinical trial participant    Cervical disc disease    S/P right total hip arthroplasty    Sleep apnea    CPAP (continuous positive airway pressure) dependence    Hypertension    GERD (gastroesophageal reflux disease)    OSA on CPAP    Normal colonoscopy 2008    Primary osteoarthritis of left hip    Osteoarthritis, knee    Hypercholesterolemia    Irritable bowel syndrome    Hyperlipemia        Procedure performed: left total Hip replacement    Significant events during hospital stay:  None    Labs during hospital stay:    Hemogram   Lab Results   Component Value Date/Time    WBC 11.2 (H) 06/16/2022 06:40 AM    HGB 12.2 (L) 06/16/2022 06:40 AM    HCT 36.4 (L) 06/16/2022 06:40 AM    PLTCNT 218 06/16/2022 06:40 AM    RBC 4.31 (L) 06/16/2022 06:40 AM    MCV 84.5 06/16/2022 06:40 AM    MCHC 33.5 06/16/2022 06:40 AM    MCH 28.3 06/16/2022 06:40 AM    RDW 13.9 09/10/2016 08:16 AM    MPV 9.0 06/16/2022 06:40 AM         Basic Metabolic Profile    Lab Results   Component Value Date/Time    SODIUM 137 06/16/2022 06:40 AM    POTASSIUM 3.9 06/16/2022 06:40 AM    CHLORIDE 103 06/16/2022 06:40 AM    CO2 25 06/16/2022 06:40 AM    ANIONGAP 9 06/16/2022 06:40 AM    Lab Results   Component Value Date/Time    BUN 15 06/16/2022 06:40 AM    CREATININE 0.74 (L) 06/16/2022 06:40 AM    GLUCOSEFAST 110 (H) 05/28/2011 08:13 AM    GLUCOSENF 120 10/26/2018 10:00 AM         Antibiotic therapy:  Routine prophylactic Ancef completed    VTE prophylaxis: TED hose for 4 weeks and Xarelto 10 mg by mouth once daily for 30 days    Weight bearing on operative extremity:  Weight bearing as tolerated with a walker or two crutches at all times until further notice  Wound care: daily dry dressing change. Signs of infection are to be reported immediately. and any wound drainage past post-op day 6 is to be reported to the surgeon immediately.. The patient should be sent to the [facility] ER if contact with the surgeon or surgical team is unsuccessful    Restrictions:  Posterior hip precautions    Therapy needed:  Total hip replacement protocol    DME needed:  the patient has all the necessary DME already    Disposition:  home with family    Condition at discharge: Safe but needs assistance    Follow up with the surgeon: in 2 weeks    See discharge instructions below.  These apply whether patient is home or transferred to extended care  facility.      CONDITION ON DISCHARGE:  General condition: Pain controlled, tolerating PO intake, passing gas  Cognitive status: A&O  Code status at discharge: FULL  Dressing: Left in place  Wound: C/d/i, no signs of infection    Please review attached discharge instructions and call with any questions or concerns.      OPIOID PRESCRIPTION - FIRST PRESCRIPTION  Diagnosis requiring prescription: S/p left THA    Medication Dosage/Frequency being prescribed: Oxycodone 5mg  (1-2 tablets every 4 hours as needed for pain)    I have reviewed prior medication history in the medical record for this patient.  I have also reviewed information contained in the state controlled prescription drug monitoring database.    I have discussed any history of non-pharmacological treatment with the patient.  The patient reports no prior treatment history.      The patient denies history of substance abuse treatment.      Physical exam findings and/or clinical history warranting use of opioid treatment include: Orthopaedic joint replacement surgery    My goals for treatment include: Pain control and resumption of ADLs    I have discussed the risk of opioid addiction with the patient.  I have also discussed the risk of using sedatives and alcohol while taking opioids.      DISCHARGE DISPOSITION:  Home discharge and Will stay with family             DISCHARGE INSTRUCTIONS:       TOTAL HIP REPLACEMENT DISCHARGE INSTRUCTIONS    Total Hip Replacement Discharge Instructions    -Weight bearing status on the operative extremity: weight bearing as tolerated    -You Must use two crutches or a walker until further notice.    - You may not switch to the use of a cane.     - No driving until further notice.  The decision to commence driving will be decided at your first postoperative visit.    - Resume a healthy diet when you get home.    - Resume your usual home medications when you get home unless otherwise indicated at the time of discharge.    -  Smoking raises one's risk of post-operative infection, wound healing problems and DVT (blood clots).  If you are a smoker and cannot quit permanently, it is best that you wait at least four weeks from surgery before you resume smoking.        Exercise and restrictions    -Observe hip dislocation precautions as instructed by the therapy staff for the first 6 weeks.  Please refer to the physical therapist's instructions and therapy diagrams.     -joint center posterior hip precautions:         *Do  not sit in low chairs or low commodes.       *Do not rotate your thigh inward or cross your legs.       *Do sleep with a pillow between your legs.    - Physical therapy is rarely necessary after total hip replacement.  The only exercise we recommend is walking and gluteal, quadriceps and calf contractions.  These do not require the supervision by a physical therapist.    - No weightlifting (resistive exercise) with the operative leg for the first 6 weeks.    - No straight leg raising exercise after total hip replacement for 6 weeks as it causes groin pain.        Wound care    - Change the bandage daily with dry gauze.  The incision may be left open to air if there is no drainage.  Someone should inspect the incision daily for you.      - Two days after wound drainage stops, you may shower and wet the incision but you may not swim or soak in a tub.    - Do not apply creams, lotions, salves or medicated ointments to the incision.  Simply apply dry gauze and nothing more.    - I am to be called for ANY DRAINAGE that occurs more than 6 days after surgery    - If you have sutures or staples, only I or one of my staff are to remove them.  They are not to be removed by anyone else unless personally approved by me.        DVT (blood clots)    -Swelling of the operative leg is normal after discharge because you will be more active at home. If however you develop new pain in the groin, thigh, or calf and if swelling is unrelieved  with elevation, this may be a sign of a DVT (blood clot). Please notify your surgeon immediately if this occurs or go the nearest ER to have an ultrasound of your leg.    -Bruising of the operative leg is normal and sometimes the bruising travels all the way to the ankle, foot and toes.  This will gradually go from purple to green to yellow before it disappears completely.    -To prevent DVT (blood clot), the following is advised: Xarelto 10 mg by mouth once daily for 30 days    -TED hose by providing compression can help prevent DVT and are to be worn on both legs 18 hours per day for 4 weeks.        Post-operative Infection    -Increasing pain, redness, warmth, shaking chills, sweats or fever may be a sign of a post-operative infection.  Please notify me immediately by calling [phone].  If after hours, ask to speak to the orthopaedic resident on call.    -A fever is not uncommon the first three days after surgery.  A fever of 100.5 or higher more than 72 hours after surgery is a concern and you should notify us if this occurs.    - Please inspect the incision every day.  A little spotting on the dressing is normal.  Drainage from the incision that persists more than 6 days after surgery may be a sign of infection and requires my immediate attention.  Please call [phone] and ask to speak to me (or the resident on call after hours or on weekends).    -No one should place you on an antibiotic for signs of a  surgical site infection without first consulting this surgeon.  Infection of the hip wound is a surgical emergency and cannot be cured with antibiotics alone.    - No dental procedures for 6 weeks as this can cause infection in your new joint replacement    - You must receive antibiotics immediately prior to dental visits and procedures like colonoscopy and cystoscopy.  This is a life-long recommendation.        Falls    -A fall after surgery can be devastating.  Falls can result in dislocation, rupture  of a ligament or tendon, or fracture of a bone around the new prosthesis. These injuries can be extremely difficult to repair and often require a revision of the entire prosthesis.  Revision operations are associated with a higher complication rate.  Therefore it is crucial that you avoid falling after surgery.  Many falls are caused by poor lighting, a throw rug, a small pet, or too much haste.  Please take time to remove obstacles, secure pets somewhere safe, and improve lighting at home with the use of properly placed nightlights.        Nozin Nasal Swabbing    -Surgical site infections may be related to the number of times we touch our face and nose as the nostrils do harbor bacteria.  Nozin can disinfect the nostrils and reduce the risk of surgical site infection.  Please swab the inside of both nostrils three times per day with a fresh Q-tip as instructed by the nursing staff.  Do this for the first 2 weeks after surgery.        Post-operative pain    -Ice may be applied 20 minutes at a time every 2 hours as needed.  Be careful not to apply ice to bare skin as ice can cause a burn just like heat.    -PRESCRIPTION NARCOTIC PAIN RELIEVERS ARE HIGHLY ADDICTIVE.  THEY SHOULD BE RESERVED FOR MODERATE TO SEVERE PAIN.  THE PRESCRIPTION NARCOTIC WILL NOT BE REFILLED FOR THIS PROBLEM.    -For mild pain, use 2 regular strength Tylenol every 6 hours as needed  -For moderate pain, take 1 prescription pain pill every 4 hours as needed  -For more severe pain, take 2 prescription pain pills every 4 hours as needed    - Prescription pain relievers are constipating for most people.  Be prepared with over the counter remedies like Milk of Magnesia, Prune Juice, Hot Tea, etc.  Senokot has been prescribed to help with constipation.  Fleets enemas and suppositories are available over the counter if needed.    -Unused pain pills may be discarded in the trash after mixing with kitty litter or coffee grounds.      -If you experience  chest pain or shortness of breath, please call 911 immediately.  This may be a sign of a heart attack or pulmonary embolus (blood clot that travels to the heart and lung).    -Follow up with your surgeon 10 to 14 days from discharge.  The appointment date and time will be given to you either before discharge, or you will be contacted via phone/mail with the date and time.      -Please re-read these discharge instructions when you return home as many of your questions may be answered.    [facility]  [phone]              --  Pati Gallo, APRN,NP-C  Department of Orthopaedics  Pager: [phone]  Phone: [phone] (9a-5p)   06/16/2022 12:05      Copies sent to Care Team         Relationship Specialty Notifications Start End    Marchelle Gearing, MD PCP - General FAMILY MEDICINE Admissions 10/14/20     Phone: [phone] Fax: [phone]         [address] [facility] [address]    Rosaura Carpenter, APRN,NP-C Registered Nurse NURSE PRACTITIONER Admissions 12/29/21     Phone: [phone] Fax: [phone]         [address] [facility] [address]    Marta Antu, MD  PAIN MANAGEMENT Admissions 12/29/21     Phone: [phone] Fax: [phone]         [address] [facility] [address]            Referring providers can utilize [email or web address] to access their referred [facility] patient's information.       I personally reviewed the post-hospital care with the patient at the time of discharge.  Very specific instructions were reviewed with the patient in person and issued to the patient in print form.  All questions were answered and instructions regarding follow up were made.  The patient was encouraged to call my office with any questions or concerns.    Kathee Delton MD  Assistant Professor Orthopaedic Surgery  [phone]         cc: Primary CarePhysician:  Marchelle Gearing, MD  [address]  [facility] [address]     [email or web address]  Physician:  Provider Unknown  No address on file

## 2022-06-17 ENCOUNTER — Telehealth (HOSPITAL_COMMUNITY): Payer: Self-pay | Admitting: Student in an Organized Health Care Education/Training Program

## 2022-06-17 ENCOUNTER — Telehealth (HOSPITAL_COMMUNITY): Payer: Self-pay | Admitting: Orthopaedic Surgery

## 2022-06-17 ENCOUNTER — Encounter (HOSPITAL_BASED_OUTPATIENT_CLINIC_OR_DEPARTMENT_OTHER): Payer: Self-pay | Admitting: Orthopaedic Surgery

## 2022-06-17 NOTE — Telephone Encounter (Signed)
Transition of Care Contact Information  Discharge Date: 06/16/2022  Transition Facility Type--Hospital (Inpatient or Observation)  Facility Name--[facility]  Interactive Contact(s): Completed or attempted contact indicated by Date/Time  Completed Contact: 06/17/2022 11:41 AM  Contact Method(s)-- Patient/Caregiver Telephone  Clinical Staff Name/Role who Junita Push, RN  Transition Assessment  Discharge Summary obtained?--Yes  How are you recovering?--Improving  Discharge Meds obtained?--Yes  Discharge medication changes reviewed?--Yes  Full Medication Reconciliation Completed?--No  Medication understanding --knows new medication(s)--knows purpose of medication  Medication Concerns?--No  Have everything needed for recovery?--Yes  Care Coordination:   Patient has transition follow-up appointment date and time?--Yes  Primary Care Transition Visit planned?--No  Specialist Transition Visit planned?--Yes  Patient/caregiver plans to attend transition visit?--Yes  Primary Follow-up Barrier  Interventions provided --reinforced discharge instructions--follow-up appointment date/time reinforced--patient expresses understanding of follow-up plan  Home Health or DME ordered at discharge?--No  Clinician/Team notified?--No  Primary reason clinician notified?  Transition Note:Plan:      Patient requests transportation referral for follow up appointment? No  Instructions:     Reviewed After Visit [place] (AVS), Reviewed discharge instructions as outlined on AVS. Yes  Agrees to contact PCP for non-acute symptoms during the hours of 8 a.m. - 4 p.m., PCP number provided/reinforced. Yes  Population Health Nurse Navigators are available 24/7 at [phone] for questions and concerns.  Number provided to patient. Yes    Referrals to population health? No    Additional information/assessment:  No questions/concerns at this time.       PCP: Marchelle Gearing, MD    Earney Navy, RN

## 2022-06-17 NOTE — Telephone Encounter (Signed)
[facility] / Department of Pharmaceutical Services   Antithrombotic Stewardship Program       Called patient to complete rivaroxaban (Xarelto) education with Patient. Discussed details of rivaroxaban medication guide:     Indication for anticoagulation: VTE prophylaxis  Patient's specific indication for rivaroxaban  Take rivaroxaban One time(s) a day (addressed transition from loading dose to maintenance dose, if applicable)   What to do if a dose was missed or taken incorrectly  Notify your doctor if you are prescribed any new medications to make sure they do not interact with rivaroxaban  For pain, avoid NSAIDs if possible, use acetaminophen (speak to PCP about pain options in liver disease)  Avoid and/or minimize alcohol intake  Minor v. major side bleeding and when to seek immediate medical attention     Patient did verbalize good understanding      If concerns for understanding, what needs reinforced:   N/A    Did patient pick up medication?   Yes      Are there any concerns with being able to pick up the medication in the future, including cost or other barriers such as transportation?   No      Notes or required follow up:   N/A    Inocente Salles, RN  06/17/2022, at 08:46  Antithrombotic Stewardship Program  Anticoagulation Education Nurse Specialist  Phone: [phone]

## 2022-06-22 ENCOUNTER — Encounter (HOSPITAL_BASED_OUTPATIENT_CLINIC_OR_DEPARTMENT_OTHER): Payer: Self-pay

## 2022-06-22 ENCOUNTER — Ambulatory Visit: Payer: Medicare Other

## 2022-06-22 DIAGNOSIS — M47816 Spondylosis without myelopathy or radiculopathy, lumbar region: Secondary | ICD-10-CM

## 2022-06-22 DIAGNOSIS — M545 Low back pain, unspecified: Secondary | ICD-10-CM

## 2022-06-22 NOTE — Progress Notes (Signed)
[facility]  [address], [address]  [place], [place] [address]  [phone]        TELEMEDICINE DOCUMENTATION: Video visit  Patient Location: Home address   Patient/family aware of provider location:  Yes  Patient/family consent for telemedicine:  Yes  Examination observed and performed by:  [name], FNP-C    CC:   Chief Complaint   Patient presents with    Follow Up After Injection        HPI: 67 y.o. male contacted today via telemedicine for injection follow up. Patient had Right L4-S1 LMBB #2 with Dr. Shawnie Pons performed on 05/08/22 and Left L4-S1 LMBB #2 with Dr. Shawnie Pons on 05/12/22. Patient endorses 90-95% relief 1 day(s). Denies any side effects or complication with the procedure. Walking and getting in and out of bed was easier after both procedures. Had L THA last Monday. Now hx of Right and Left Hip. He reports the procedure went well and he is ambulating easier. He reports Dr. Graciela Husbands instructed him to wait for RFA until 6 weeks post op of his L THA. He denies red flag signs today.     Previous Update 04/21/22:  Henry [name] is a 67 y.o. male who presents to clinic for procedure follow up. Patient had Right L4-S1 LMBB #1 with Dr. Shawnie Pons performed on 03/31/22 and Left L4-S1 LMBB #1 on 04/07/22. Patient endorses 90% relief 1 day(s) for both procedures. Denies any side effects or complication with the procedure.  Standing, walking for longer periods of time were easier post procedure. He is getting a Left THA on April 29th.       Description: aching  Timing: constant   Location: low back, left hip  Onset: years   Radiating: no  Worse with: movement, bending  Better with: laying flat   Treatments tried: Tylenol  Red flag: denies dropping objects, denies issues with dexterity, denies bowel or bladder incontinence , and denies saddle anesthesia.    TREATMENT HISTORY:        Helpful Not Helpful Not Tried  Comments   MBB  X     Patient had Right L4-S1 LMBB #1 with Dr. Shawnie Pons  performed on 03/31/22 and Left L4-S1 LMBB #1 on 04/07/22. Patient endorses 90% relief 1 day(s) for both procedures.     RFA/Cryoablation           Epidural X     07/22/21 L4/5 LESI 50% relief for couple months   Transforaminal/  Nerve root block            SI injection           Intraarticular Joint            Sympathetic Block           Other            TPI           GTB            SCS           Pain pump              Physical Therapy or HEP            Massage Therapy           Acupuncture            Chiropractor           Exercise Physiologist  Counseling/CBT              [place] Pain Rating Scale     On a scale of 0-10, during the past 24 hours, pain has interfered with your usual activity: 2      On a scale of 0-10, during the past 24 hours, pain has interfered with your sleep: 3-4     On a scale of 0-10, during the past 24 hours, pain has affected your mood: 1     On a scale of 0-10, during the past 24 hours, pain has contributed to your stress: 1      On a scale of 0-10, what is your overall pain Rating: 2     PAST MEDICAL/ FAMILY/ SOCIAL HISTORY:  Past Medical History:   Diagnosis Date    Arthritis     Back problem     Cancer (CMS HCC) 11/2015    prostate cancer, melanoma back neck    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)     Chronic pain     CPAP (continuous positive airway pressure) dependence     CPAP 9 cm H2O    CPAP (continuous positive airway pressure) dependence     Deep vein thrombosis (DVT) (CMS HCC)     left leg    Eczema     GERD (gastroesophageal reflux disease)     well controlled on omeprazole    Heartburn     HTN     Hyperlipidemia     Hypertension     Irritable bowel syndrome     Melanoma (CMS HCC)     Morbid obesity with BMI of 40.0-44.9, adult (CMS HCC)     Neck problem     obstructive sleep apnea 01/2010    AHI 53.8    Osteoarthritis of back     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          Allergies   Allergen Reactions    Sulfa (Sulfonamides) Rash    Shellfish Containing Products Itching and Swelling     Shrimp - eye swelling, itching     Current Outpatient Medications   Medication Sig    Acetaminophen 500 mg Oral Capsule Take 1 Cap (500 mg total) by mouth Every 6 hours as needed    alcohol (NOZIN NASAL SANITIZER) 62% Nasal Solution Administer 1 Each into each nostril Every 8 hours for 14 days    atorvastatin (LIPITOR) 40 mg Oral Tablet Take 1 Tablet (40 mg total) by mouth Once a day (Patient taking differently: Take 1 Tablet (40 mg total) by mouth Once a day am)    dicyclomine (BENTYL) 20 mg Oral Tablet Take 1 Tablet (20 mg total) by mouth Four times a day    GLUCOS CHOND CPLX ADVANCED ORAL Take 2 Tabs by mouth Once a day Glucosamine-Chondroitin 1500 mg    hydroCHLOROthiazide (HYDRODIURIL) 25 mg Oral Tablet Take 1 Tablet (25 mg total) by mouth Once a day (Patient taking differently: Take 1 Tablet (25 mg total) by mouth Once a day am)    losartan (COZAAR) 100 mg Oral Tablet Take 1 Tablet (100 mg total) by mouth Once a day    meloxicam (MOBIC) 7.5 mg Oral Tablet Take 1 Tablet (7.5 mg total) by mouth Once a day for 30 days    metoprolol tartrate (LOPRESSOR) 50  mg Oral Tablet Take 1 Tablet (50 mg total) by mouth Twice daily    metroNIDAZOLE 1 % Gel with Pump by Apply externally route Once a day    multivitamin Oral Tablet Take 1 Tablet by mouth Once a day    Non-Formulary/Special Preparation (MEDICATION HELP) CBD 2 capsules per day     Omeprazole 20 mg Oral Tablet, Delayed Release (E.C.) Take by mouth Once a day am    ondansetron (ZOFRAN) 4 mg Oral Tablet Take 1 Tablet (4 mg total) by mouth Every 6 hours as needed for Nausea/Vomiting    oxyCODONE (ROXICODONE) 5 mg Oral Tablet Take 1-2 Tablets (5-10 mg total) by mouth Every 4 hours as needed for Pain    rivaroxaban (XARELTO) 10 mg Oral Tablet Take 1 Tablet (10 mg total) by mouth Once a day    sennosides-docusate sodium (SENOKOT-S) 8.6-50 mg Oral  Tablet Take 1 Tablet by mouth Twice daily    triamcinolone acetonide 0.1 % Cream      Past Surgical History:   Procedure Laterality Date    GALLBLADDER SURGERY      HX ANKLE FRACTURE TX Right     HX APPENDECTOMY      HX ERCP      HX HIP REPLACEMENT Right 05/22/2013    Per Dr. Graciela Husbands    PB COLONOSCOPY,DIAGNOSTIC           Social History     Tobacco Use    Smoking status: Former     Types: Cigars     Quit date: 2012     Years since quitting: 12.3    Smokeless tobacco: Never    Tobacco comments:     quit cigars in 2012   Vaping Use    Vaping status: Never Used   Substance Use Topics    Alcohol use: Yes     Alcohol/week: 2.0 standard drinks of alcohol     Types: 2 Cans of beer per week     Comment: per week    Drug use: No     Comment: capsule     Family Medical History:       Problem Relation (Age of Onset)    Asthma Father    Atherosclerosis Paternal Grandmother    Breast Cancer Maternal Grandmother    COPD Father    Cancer Mother, Father    Coronary Artery Disease Mother    Diabetes Mother    High Cholesterol Mother    Hypertension (High Blood Pressure) Mother    Melanoma Brother    Migraines Mother    Other Maternal Grandfather    Prostate Cancer Paternal Grandfather    Stomach Cancer Maternal Grandmother              Review of Systems:   CONSTITUTIONAL: Denies weight change. Denies fever.   GI: Denies bowel incontinence.  GU: Denies urinary incontinence or retention.  NEUROLOGICAL: Denies paralysis. Denies tremors    PSYCHIATRIC: Denies acute changes in mood.  MUSCULOSKELETAL: See HPI.     PHYSICAL EXAMINATION:  Gen: appears stated age, NAD, well-developed  Psych: normal affect, normal mood  Head: AT, NC   Eyes: conjunctiva clear, non-icteric  Pulm: non-labored, no acute respiratory distress  Neuro: alert and oriented      IMAGING:  Recent Results (from the past [id number] hour(s))   MRI SPINE LUMBOSACRAL WO CONTRAST    Collection Time: 06/03/20 10:27 AM    Narrative    CLINICAL INFORMATION:  Henry [name]  Male, 67 years old.    MRI SPINE LUMBOSACRAL WO CONTRAST performed on 06/03/2020 10:27 AM.    REASON FOR EXAM:  M54.16: Lumbar radiculopathy    COMPARISON: Radiographs of the lumbar spine 05/05/2020.    TECHNIQUE: MRI of the lumbosacral spine was performed  without contrast. 3 plane localizer, sagittal T2, sagittal T1, coronal T2, coronal STIR, axial T2 and axial T1 sequences were obtained.    FINDINGS:   Straightening of the lumbar lordosis. Grade 1 anterolisthesis of L4 on L5. Vertebral body heights are normal. Marrow signal is heterogeneous without evidence of an aggressive marrow replacing process. Mild disc height loss is noted at L4-L5.    The conus ends at L1-L2. No clumping or thickening of the cauda equina.     Level by level evaluation is as follows:    T12-L1: No spinal canal or neural foraminal stenosis. There is mild disc bulging and bilateral facet hypertrophy.  L1-L2: No spinal canal or neural foraminal stenosis. Mild disc bulging and bilateral facet hypertrophy.  L2-L3: Disc bulging and moderate ligamentum flavum thickening result in mild spinal canal stenosis. Moderate bilateral facet hypertrophy results in mild bilateral neural foraminal stenosis.    L3-L4: Disc bulging and mild facet hypertrophy result in mild spinal canal stenosis. Posterior endplate osteophytes and moderate facet hypertrophy result in mild bilateral neural foraminal stenosis.    L4-L5: Diffuse disc bulging and advanced ligamentum flavum thickening results in severe spinal canal stenosis. There is effacement of the bilateral subarticular recesses with contact of the traversing bilateral L5 nerve roots. Advanced bilateral facet hypertrophy results in moderate bilateral neural foraminal stenosis. Far lateral disc bulging results in contact of the exiting bilateral L4 nerve roots.    L5-S1: Diffuse disc bulging and mild ligamentum flavum thickening do not result in spinal canal stenosis. Posterior endplate osteophytes and  moderate facet hypertrophy results in moderate bilateral neural foraminal stenosis. Far lateral disc bulging results in contact of the exiting right L5 nerve root.      Prevertebral soft tissues are normal.        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 There is associated effacement of the subarticular recesses with contact of the traversing bilateral L5 nerve roots.  2.Up to moderate neural foraminal stenosis of the bilateral L4-L5 and L5-S1 levels.  3.Far lateral disc bulging contacts the exiting bilateral L4 and exiting right L5 nerve roots.                LABS:  CBC  Diff   Lab Results   Component Value Date/Time    WBC 11.2 (H) 06/16/2022 06:40 AM    HGB 12.2 (L) 06/16/2022 06:40 AM    HCT 36.4 (L) 06/16/2022 06:40 AM    PLTCNT 218 06/16/2022 06:40 AM    RBC 4.31 (L) 06/16/2022 06:40 AM    MCV 84.5 06/16/2022 06:40 AM    MCHC 33.5 06/16/2022 06:40 AM    MCH 28.3 06/16/2022 06:40 AM    RDW 13.9 09/10/2016 08:16 AM    MPV 9.0 06/16/2022 06:40 AM    Lab Results   Component Value Date/Time    PMNS 57.4 05/26/2022 03:10 PM    LYMPHOCYTES 32 12/25/2015 01:28 PM    EOSINOPHIL 4 12/25/2015 01:28 PM    MONOCYTES 7.3 05/26/2022 03:10 PM    BASOPHILS 0.5 05/26/2022 03:10 PM    BASOPHILS <0.10 05/26/2022 03:10 PM    PMNABS 4.32 05/26/2022 03:10  PM    LYMPHSABS 2.31 05/26/2022 03:10 PM    EOSABS 0.28 05/26/2022 03:10 PM    MONOSABS 0.55 05/26/2022 03:10 PM    BASOSABS 0.062 09/21/2013 04:20 PM          COMPREHENSIVE METABOLIC PANEL   Lab Results   Component Value Date    SODIUM 137 06/16/2022    POTASSIUM 3.9 06/16/2022    CHLORIDE 103 06/16/2022    CO2 25 06/16/2022    ANIONGAP 9 06/16/2022    BUN 15 06/16/2022    CREATININE 0.74 (L) 06/16/2022    GLUCOSENF 120 10/26/2018    CALCIUM 8.8 06/16/2022    PHOSPHORUS 3.1 09/17/2021    ALBUMIN 4.1 05/26/2022    TOTALPROTEIN 7.2 05/26/2022    ALKPHOS 89 05/26/2022    AST 32 05/26/2022    ALT 50 05/26/2022     BILIRUBINCON 4.1 (H) 09/17/2021             ASSESSMENT:    ICD-10-CM    1. Low back pain, unspecified back pain laterality, unspecified chronicity, unspecified whether sciatica present  M54.50 RHIZOTOMY LUMBAR/SACRAL W FLUORO 2 LEVEL; 64635, 64636 Prior Auth/Scheduling Procedure     PAIN CL FL RHIZOTOMY LUMBAR SACRAL 2 LEVELS     DESTRUCTION BY NEUROLYTIC AGENT, PARAVERTEVRAL FACET JOINT NERVE, W IMAGING GUIDANCE-LUMBAR OR SACRAL; UP TO 5 FACET JOINTS (AMB ONLY)      2. Lumbar spondylosis  M47.816 RHIZOTOMY LUMBAR/SACRAL W FLUORO 2 LEVEL; 64635, [phone] Prior Auth/Scheduling Procedure     PAIN CL FL RHIZOTOMY LUMBAR SACRAL 2 LEVELS     DESTRUCTION BY NEUROLYTIC AGENT, PARAVERTEVRAL FACET JOINT NERVE, W IMAGING GUIDANCE-LUMBAR OR SACRAL; UP TO 5 FACET JOINTS (AMB ONLY)          PLAN:  Orders Placed This Encounter    DESTRUCTION BY NEUROLYTIC AGENT, PARAVERTEVRAL FACET JOINT NERVE, W IMAGING GUIDANCE-LUMBAR OR SACRAL; UP TO 5 FACET JOINTS (AMB ONLY)    PAIN CL FL RHIZOTOMY LUMBAR SACRAL 2 LEVELS    RHIZOTOMY LUMBAR/SACRAL W FLUORO 2 LEVEL; [id number], [id number] Prior Auth/Scheduling Procedure     - Bilateral L4-S1 RFA with Dr. Shawnie Pons.    Patient to call and schedule this for 6 weeks post op of his L THA.    Patient reports he is on [facility] ([facility]Rh) currently, but will be off of this 2 weeks prior to his RFA. Discussed if this changes, to notify me and let me know. Pt confirmed understanding.    Discussed no open wounds or abx for injection. Pt confirmed understanding.    No implantable devices.   - F/U 6 weeks after RFA or sooner if needed.   - Patient informed of red flags symptoms like caudal anesthesia, bowel, bladder incontinence including the inability to urinate, weakness of legs that they need to go to the ED emergently for possible impingement of the spinal nerve roots and may need urgent decompression.  Patient confirmed understanding.       12 minutes of medical discussion and decision making spent with the patient via telemedicine  and chart review.    Our impression, treatment recommendations, and plan from today's visit were reviewed in detail with the patient. All of the patient's questions were answered and wishes to move forward with above noted plan.   The patient was seen independently with a physician available via phone if needed.    [name], FNP-C, 06/22/2022

## 2022-06-23 ENCOUNTER — Encounter (HOSPITAL_BASED_OUTPATIENT_CLINIC_OR_DEPARTMENT_OTHER): Payer: Self-pay | Admitting: Orthopaedic Surgery

## 2022-06-23 ENCOUNTER — Telehealth (HOSPITAL_BASED_OUTPATIENT_CLINIC_OR_DEPARTMENT_OTHER): Payer: Self-pay | Admitting: Orthopaedic Surgery

## 2022-06-23 NOTE — Telephone Encounter (Signed)
Returned call he left on my office voicemail.  He states he has "heat rash" on the small of his back. He states it does "itch"  It is only localized to the small of his back.  Denies it being anywhere else.  He is taking Mobic and his Xarelto.  He is going to send [place] a picture when he is able.  Advised he may use Benadryl by mouth/Benadryl cream. Will await picture and discuss with Dr. Graciela Husbands

## 2022-06-24 ENCOUNTER — Encounter (HOSPITAL_COMMUNITY): Payer: Self-pay | Admitting: Orthopaedic Surgery

## 2022-06-24 ENCOUNTER — Encounter (INDEPENDENT_AMBULATORY_CARE_PROVIDER_SITE_OTHER): Payer: Self-pay

## 2022-06-26 ENCOUNTER — Ambulatory Visit (HOSPITAL_BASED_OUTPATIENT_CLINIC_OR_DEPARTMENT_OTHER): Payer: Self-pay | Admitting: Student in an Organized Health Care Education/Training Program

## 2022-06-30 ENCOUNTER — Other Ambulatory Visit: Payer: Self-pay

## 2022-06-30 ENCOUNTER — Ambulatory Visit
Payer: Medicare Other | Attending: Student in an Organized Health Care Education/Training Program | Admitting: Student in an Organized Health Care Education/Training Program

## 2022-06-30 ENCOUNTER — Encounter (HOSPITAL_BASED_OUTPATIENT_CLINIC_OR_DEPARTMENT_OTHER): Payer: Self-pay | Admitting: Student in an Organized Health Care Education/Training Program

## 2022-06-30 VITALS — Temp 96.6°F | Ht 69.25 in | Wt 270.9 lb

## 2022-06-30 DIAGNOSIS — Z96642 Presence of left artificial hip joint: Secondary | ICD-10-CM | POA: Insufficient documentation

## 2022-06-30 DIAGNOSIS — Z471 Aftercare following joint replacement surgery: Secondary | ICD-10-CM

## 2022-06-30 NOTE — Progress Notes (Signed)
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  The patient reports 2/10 pain.  Patient denies fever, chills or sweats.  Patient denies chest pain or shortness of breath.  The patient is using Xarelto for DVT prophylaxis.    EXAM:    Temp (!) 35.9 C (96.6 F) (Thermal Scan)   Ht 1.759 m (5' 9.25")   Wt 123 kg (270 lb 15.1 oz)   BMI 39.72 kg/m       On exam, there is no obvious sign of infection.  There is no drainage.  The incision appears to be healing nicely.  There is no obvious clinical sign DVT.  Leg lengths appear to be equal.  Peroneal and tibial nerve it intact.    IMPRESSION:     ICD-10-CM    1. S/P total left hip arthroplasty  Z96.642            PLAN:  Signs and symptoms of peri-prosthetic infection and thromboemobolism were re-iterated. Hip precautions were reinforced. The patient was encouraged to increase activity as tolerated.  Low impact activity was advised.  Return to work status:  the patient is retired.  The incision had been closed with a running subcuticular stitch and skin glue.  The patient may shower and cleanse the incision but may not swim or soak in a tub until the sixth week after surgery. The patient will follow up in 4 weeks with a left hip series.  The patient was encouraged to contact us with questions or concerns.    This patient was seen independently in clinic.  Veatrice Kells, PA-C  06/30/2022, 10:02      The patient's condition and plan of care was discussed with me.  The note above was personally reviewed and appended before signature.    Kathee Delton MD  Associate Professor of Orthopaedic Surgery  Ph   [phone]  Fax [phone]  Pager [phone]  07/01/2022 07:02        Cc:    Marchelle Gearing, MD  [facility] [address]  [facility] [address]    Self, Referral  No address on file

## 2022-07-01 ENCOUNTER — Encounter (INDEPENDENT_AMBULATORY_CARE_PROVIDER_SITE_OTHER): Payer: Self-pay

## 2022-07-27 ENCOUNTER — Telehealth (INDEPENDENT_AMBULATORY_CARE_PROVIDER_SITE_OTHER): Payer: Self-pay | Admitting: Neurological Surgery

## 2022-07-27 NOTE — Telephone Encounter (Signed)
CM contacted PT and went over Non Fasting guidelines for upcoming procedure on 06.10.24 with Dr Pratt. PT states they are not currently taking antibiotics. PT had no further questions at this time Clinic phone number was provided to PT at this time.    07/27/2022  Keishawn Darsey, CASE MANAGER

## 2022-07-28 ENCOUNTER — Ambulatory Visit: Payer: Medicare Other | Attending: Orthopaedic Surgery | Admitting: Orthopaedic Surgery

## 2022-07-28 ENCOUNTER — Encounter (HOSPITAL_BASED_OUTPATIENT_CLINIC_OR_DEPARTMENT_OTHER): Payer: Self-pay | Admitting: Orthopaedic Surgery

## 2022-07-28 ENCOUNTER — Inpatient Hospital Stay (HOSPITAL_BASED_OUTPATIENT_CLINIC_OR_DEPARTMENT_OTHER)
Admission: RE | Admit: 2022-07-28 | Discharge: 2022-07-28 | Disposition: A | Discharge status: Home / Self Care | Payer: Medicare Other | Source: Ambulatory Visit | Admitting: Radiology

## 2022-07-28 ENCOUNTER — Other Ambulatory Visit: Payer: Self-pay

## 2022-07-28 VITALS — Temp 97.2°F | Ht 68.98 in | Wt 271.8 lb

## 2022-07-28 DIAGNOSIS — M25552 Pain in left hip: Secondary | ICD-10-CM

## 2022-07-28 DIAGNOSIS — Z96642 Presence of left artificial hip joint: Secondary | ICD-10-CM | POA: Insufficient documentation

## 2022-07-28 DIAGNOSIS — Z471 Aftercare following joint replacement surgery: Secondary | ICD-10-CM

## 2022-07-28 NOTE — Progress Notes (Signed)
[facility]    SUBJECTIVE:  The patient returns today 6 weeks status post left total Hip replacement.  The patient reports 1/10 pain.  Patient denies fever, chills or sweats.  Patient denies chest pain or shortness of breath.  The patient has completed xarelto for DVT prophylaxis.    EXAM:    Temp 36.2 C (97.2 F) (Thermal Scan)   Ht 1.752 m (5' 8.98")   Wt 123 kg (271 lb 13.2 oz)   BMI 40.17 kg/m   On exam, there is no obvious sign of infection.  There is no drainage.  The incision appears to be healed.  There is no obvious sign DVT.  Leg lengths appear to be equal.  Peroneal and tibial nerve it intact.     X-RAYS:  Radiographs of the left hip performed today reveal a well aligned well fixed left total hip replacement.      IMPRESSION:     ICD-10-CM    1. S/P total left hip arthroplasty  Z96.642       2. Left hip pain  M25.552 Left Hip [facility] x-ray           PLAN:  he is welcome to return to weight training, but will start with low weight, high rep. Signs and symptoms of peri-prosthetic infection and thromboemobolism were re-iterated. Hip precautions were relaxed. The patient was encouraged to increase activity as tolerated.  Low impact activity was advised.  The patient may resume bathing in a tub or pool. The patient will follow up in 6 weeks with a left hip series.  The patient was encouraged to contact us with questions or concerns.      This patient was seen independently in clinic.  Milton Ferguson, PA-C  07/28/2022, 11:40      Cc:    Marchelle Gearing, MD  [facility] [address]  [place] [address]

## 2022-07-29 ENCOUNTER — Encounter (HOSPITAL_BASED_OUTPATIENT_CLINIC_OR_DEPARTMENT_OTHER): Payer: Self-pay | Admitting: Student in an Organized Health Care Education/Training Program

## 2022-07-29 ENCOUNTER — Ambulatory Visit
Payer: Medicare Other | Attending: Student in an Organized Health Care Education/Training Program | Admitting: Student in an Organized Health Care Education/Training Program

## 2022-07-29 VITALS — BP 134/84 | HR 70 | Temp 97.9°F | Ht 68.98 in | Wt 272.7 lb

## 2022-07-29 DIAGNOSIS — E7800 Pure hypercholesterolemia, unspecified: Secondary | ICD-10-CM | POA: Insufficient documentation

## 2022-07-29 DIAGNOSIS — R7303 Prediabetes: Secondary | ICD-10-CM | POA: Insufficient documentation

## 2022-07-29 DIAGNOSIS — G4733 Obstructive sleep apnea (adult) (pediatric): Secondary | ICD-10-CM | POA: Insufficient documentation

## 2022-07-29 DIAGNOSIS — I10 Essential (primary) hypertension: Secondary | ICD-10-CM | POA: Insufficient documentation

## 2022-07-29 DIAGNOSIS — M47816 Spondylosis without myelopathy or radiculopathy, lumbar region: Secondary | ICD-10-CM | POA: Insufficient documentation

## 2022-07-29 NOTE — Progress Notes (Signed)
Department of Family Medicine   Progress Note    Henry [name]  MRN: [id number]  DOB: [birth year]  Date of Service: 07/29/2022    CHIEF COMPLAINT  Chief Complaint   Patient presents with    Hypertension       SUBJECTIVE  Henry [name] is a 67 y.o. male who presents to clinic for follow-up.     Since his last visit he did not undergo left hip arthroplasty.  Is now 6 weeks postop and feels he is doing fairly well.  Trying to be more active.  Pain is under good control.      Hypertension:  Denies side effects from current medication.  Not having any chest pain, dyspnea, headaches, or dizziness.      Hyperlipidemia: Taking Lipitor without side effects.    OSA:  Is compliant with his CPAP.  Reports feels it is working well.      Lumbar spondylosis: Following with the Pain Clinic.  Scheduled for a rhizotomy in a week and a half.    Review of Systems:  Positive ROS discussed in HPI, otherwise all other systems negative.      Medications:   Acetaminophen 500 mg Oral Capsule, Take 1 Cap (500 mg total) by mouth Every 6 hours as needed  atorvastatin (LIPITOR) 40 mg Oral Tablet, Take 1 Tablet (40 mg total) by mouth Once a day (Patient taking differently: Take 1 Tablet (40 mg total) by mouth Once a day am)  diclofenac sodium (VOLTAREN) 75 mg Oral Tablet, Delayed Release (E.C.), Take 1 Tablet (75 mg total) by mouth Twice daily  dicyclomine (BENTYL) 20 mg Oral Tablet, Take 1 Tablet (20 mg total) by mouth Four times a day  GLUCOS CHOND CPLX ADVANCED ORAL, Take 2 Tabs by mouth Once a day Glucosamine-Chondroitin 1500 mg  hydroCHLOROthiazide (HYDRODIURIL) 25 mg Oral Tablet, Take 1 Tablet (25 mg total) by mouth Once a day (Patient taking differently: Take 1 Tablet (25 mg total) by mouth Once a day am)  losartan (COZAAR) 100 mg Oral Tablet, Take 1 Tablet (100 mg total) by mouth Once a day  metoprolol tartrate (LOPRESSOR) 50 mg Oral Tablet, Take 1 Tablet (50 mg total) by mouth Twice daily  metroNIDAZOLE 1 % Gel with Pump, by  Apply externally route Once a day  multivitamin Oral Tablet, Take 1 Tablet by mouth Once a day  Non-Formulary/Special Preparation (MEDICATION HELP), CBD 2 capsules per day   Omeprazole 20 mg Oral Tablet, Delayed Release (E.C.), Take by mouth Once a day am  triamcinolone acetonide 0.1 % Cream,   alcohol (NOZIN NASAL SANITIZER) 62% Nasal Solution, Administer 1 Each into each nostril Every 8 hours for 14 days  meloxicam (MOBIC) 7.5 mg Oral Tablet, Take 1 Tablet (7.5 mg total) by mouth Once a day for 30 days    No facility-administered medications prior to visit.      Allergies:   Allergies   Allergen Reactions    Sulfa (Sulfonamides) Rash    Shellfish Containing Products Itching and Swelling     Shrimp - eye swelling, itching         OBJECTIVE  BP 134/84   Pulse 70   Temp 36.6 C (97.9 F) (Thermal Scan)   Ht 1.752 m (5' 8.98")   Wt 124 kg (272 lb 11.3 oz)   SpO2 96%   BMI 40.30 kg/m       General: no distress  HENT:  Atraumatic, normocephalic  Lungs: clear to auscultation bilaterally  Cardiovascular:  RRR, no murmur  Abdomen:  Nondistended  Extremities: no cyanosis or edema  Skin: warm and dry, no rash  Neurologic: gait is normal, AOx3, CN 2-12 grossly intact  Psychiatric: normal affect and behavior    ASSESSMENT/PLAN  (I10) Primary hypertension  (primary encounter diagnosis)  Plan:  Chronic, controlled  -continue hydrochlorothiazide 25 mg daily and losartan 100 mg daily    (E78.00) Hypercholesterolemia  Plan:  Chronic, stable   -continue Lipitor 40 mg daily    (G47.33) OSA on CPAP  Plan:  Chronic, controlled   -continue CPAP    (M47.816) Lumbar spondylosis  Plan:  Chronic, stable   -continue diclofenac 75 mg b.i.d. and Tylenol  -continue to follow up with the Pain Clinic    (R73.03) Prediabetes  Plan:  Chronic, stable   -will get updated A1c in August      No orders of the defined types were placed in this encounter.        Return in about 6 months (around 01/28/2023), or if symptoms worsen or fail to  improve.      Marchelle Gearing, MD 07/29/2022, 12:19

## 2022-08-07 ENCOUNTER — Ambulatory Visit: Payer: Medicare Other | Admitting: Neurological Surgery

## 2022-08-07 ENCOUNTER — Encounter (INDEPENDENT_AMBULATORY_CARE_PROVIDER_SITE_OTHER): Payer: Self-pay | Admitting: Neurological Surgery

## 2022-08-07 ENCOUNTER — Other Ambulatory Visit: Payer: Self-pay

## 2022-08-07 DIAGNOSIS — M545 Low back pain, unspecified: Secondary | ICD-10-CM | POA: Insufficient documentation

## 2022-08-07 DIAGNOSIS — M47816 Spondylosis without myelopathy or radiculopathy, lumbar region: Secondary | ICD-10-CM | POA: Insufficient documentation

## 2022-08-07 NOTE — Patient Instructions (Signed)
[facility]  [address]  [place] [address]  Dept: [phone]  Dept Fax: [phone]  [phone]                                                 SPECIAL PROCEDURES                                     DISCHARGE FORM                                          [phone]      Please follow the instructions listed below for your procedures.  If you have questions concerning your procedure, you may call and leave a message.  Messages will be returned by the end of the next business day.  If you have an emergency, proceed to your local Emergency Department.      PROCEDURE: BILATERAL LUMBAR RADIOFREQUENCY ABLATION     Do not drive a car or operate machinery until tomorrow.  Rest today and return to normal activities tomorrow.  If you are on restricted activities by your physician, please continue to follow these.  If you are not sure, contact your physician.  It is possible to experience mild numbness of the lower back and legs.  This is temporary.  If you have soreness at the injection site, the application of heat or ice may be helpful. Mild analgesics may also be used.  In case of severe headache; lie flat to decrease it.  Increase all fluids especially those with caffeine.  Mild analgesics are also appropriate.  If headache is not relieved by these measures, contact the Pain Clinic.  Steroid injections may cause temporary increase of blood sugar levels.    These instructions have been reviewed with the patient and appropriate questions have answers.  Erskine Speed, LPN 8/84/1660 63:01

## 2022-08-07 NOTE — Procedures (Signed)
[facility]  [address]  [facility] [place] [address]  Operated by [facility]  Procedure Note    Name: Henry [name] MRN:  [id number]   Date: 08/07/2022 DOB:  September 07, 1955 (67 y.o.)         DESTRUCTION BY NEUROLYTIC AGENT, PARAVERTEVRAL FACET JOINT NERVE, W IMAGING GUIDANCE-LUMBAR OR SACRAL; UP TO 5 FACET JOINTS (AMB ONLY)    Performed by: Marta Antu, MD  Authorized by: [name], FNP-C    List number of facet joints::  4      Marta Antu, MD    Lumbar Medial Branch Ablation with Fluoro    Pre Procedure Dx:  lumbar spondylosis    Post Procedure Dx:  lumbar spondylosis    Guidance:  Fluoroscopy    Side:  bilateral    Level:  L4/5/s1    Skin:  6ml 1% lido    Prelesion:  6ml 2% lido, 2ml .25% bupi, and 40mg  kenalog    Postlesion:  none    Consent and timeout done.  Skin prepped and draped in routine fashion.  Sterile technique maintained throughout.    In 10 degrees ipsalateral oblique with caudal to cephalic tilt the junction of SAP and transverse processes at:     bilateral L4/5 as well as the sacral ala were identified using the fluoroscope.  Skin overlying was anesthetized.  Neurotherm needles with 1cm active tips advanced until periosteum contacted.  In an AP view each needle was slid into position.  Sensory and motor stimulation performed.  Prelesion anesthetic administered and we waiting 2 minutes then each site was lesioned at 80 degrees for 90 seconds.  Next the post lesion anesthetic was given and the needles were withdrawn.  Patient tolerated the procedure well.    Henry Hawking, MD

## 2022-08-07 NOTE — Nursing Note (Signed)
[place] Pain Rating Scale     On a scale of 0-10, during the past 24 hours, pain has interfered with you usual activity: 5     On a scale of 0-10, during the past 24 hours, pain has interfered with your sleep: 2    On a scale of 0-10, during the past 24 hours, pain has affected your mood: 2     On a scale of 0-10, during the past 24 hours, pain has contributed to your stress: 2     On a scale of 0-10, what is your overall pain Rating: 5

## 2022-08-25 ENCOUNTER — Encounter (HOSPITAL_BASED_OUTPATIENT_CLINIC_OR_DEPARTMENT_OTHER): Payer: Self-pay | Admitting: Student in an Organized Health Care Education/Training Program

## 2022-09-02 ENCOUNTER — Encounter (HOSPITAL_BASED_OUTPATIENT_CLINIC_OR_DEPARTMENT_OTHER): Payer: Self-pay

## 2022-09-03 ENCOUNTER — Encounter (INDEPENDENT_AMBULATORY_CARE_PROVIDER_SITE_OTHER): Payer: Self-pay

## 2022-09-07 ENCOUNTER — Encounter (HOSPITAL_BASED_OUTPATIENT_CLINIC_OR_DEPARTMENT_OTHER): Payer: Self-pay

## 2022-09-08 ENCOUNTER — Inpatient Hospital Stay (HOSPITAL_BASED_OUTPATIENT_CLINIC_OR_DEPARTMENT_OTHER)
Admission: RE | Admit: 2022-09-08 | Discharge: 2022-09-08 | Disposition: A | Discharge status: Home / Self Care | Payer: Medicare Other | Source: Ambulatory Visit | Admitting: Radiology

## 2022-09-08 ENCOUNTER — Ambulatory Visit: Payer: Medicare Other | Attending: Orthopaedic Surgery | Admitting: Orthopaedic Surgery

## 2022-09-08 ENCOUNTER — Other Ambulatory Visit: Payer: Self-pay

## 2022-09-08 ENCOUNTER — Ambulatory Visit (HOSPITAL_BASED_OUTPATIENT_CLINIC_OR_DEPARTMENT_OTHER): Payer: Medicare Other

## 2022-09-08 VITALS — Temp 96.8°F | Ht 68.9 in | Wt 271.2 lb

## 2022-09-08 DIAGNOSIS — Z96642 Presence of left artificial hip joint: Secondary | ICD-10-CM

## 2022-09-08 DIAGNOSIS — Z471 Aftercare following joint replacement surgery: Secondary | ICD-10-CM

## 2022-09-08 DIAGNOSIS — M25552 Pain in left hip: Secondary | ICD-10-CM | POA: Insufficient documentation

## 2022-09-08 DIAGNOSIS — Z09 Encounter for follow-up examination after completed treatment for conditions other than malignant neoplasm: Secondary | ICD-10-CM

## 2022-09-08 MED ORDER — AMOXICILLIN 500 MG CAPSULE
2000.0000 mg | ORAL_CAPSULE | Freq: Once | ORAL | 3 refills | Status: DC
Start: 2022-09-08 — End: 2022-09-21

## 2022-09-08 NOTE — Progress Notes (Addendum)
[facility]    SUBJECTIVE:  The patient returns today 12 weeks status post left total Hip replacement.  The patient reports 0/10 pain.  Patient denies symptoms of infection.  Patient denies chest pain or shortness of breath.  The patient has completed DVT prophylaxis.    EXAM:    Temp 36 C (96.8 F)   Ht 1.75 m (5' 8.9")   Wt 123 kg (271 lb 2.7 oz)   BMI 40.16 kg/m       On exam, there is no obvious sign of infection.  There is no obvious sign of DVT.  Leg lengths appear to be equal.  Peroneal and tibial nerve it intact.  Gait is near normal.  Range of motion of the left  hip is full without pain or limitations.      X-RAYS:  Radiographs of the left hip performed today reveal a well aligned well fixed total hip replacement.  There has been no component migration since the last set of radiographs.    IMPRESSION:     ICD-10-CM    1. S/P total left hip arthroplasty  Z96.642       2. Follow-up examination after orthopedic surgery  Z09 Left Hip [facility] Series x-ray           PLAN:  Signs and symptoms of peri-prosthetic infection were re-iterated. The patient has no activity limitations.  The patient was encouraged to increase activity as tolerated.  Low impact activity was advised.  Return to work status:  the patient is retired.  The patient will follow up at the one year anniversary from surgery with a bilateral hip series.  The patient was encouraged to contact us with questions or concerns.    A shared visit was performed for this patient with the co-signing physician.  Veatrice Kells, PA-C  09/08/2022, 10:27      The patient was seen in tandem with the APP and these are my findings:  S/P total left hip arthroplasty [A54.098]   I personally saw and examined the patient. I reviewed imaging. The majority of my encounter was spent in medical decision making. See physician's assistant note for additional details.     Kathee Delton MD  Associate Professor of Orthopaedic Surgery  Ph    [phone]  Fax [phone]  09/08/2022 10:56        Cc:    Marchelle Gearing, MD  [facility] [address]  [facility] [id number]    Self, Referral  No address on file

## 2022-09-09 ENCOUNTER — Encounter (INDEPENDENT_AMBULATORY_CARE_PROVIDER_SITE_OTHER): Payer: Self-pay

## 2022-09-09 ENCOUNTER — Ambulatory Visit: Payer: Medicare Other

## 2022-09-09 VITALS — BP 167/77 | HR 64 | Temp 97.2°F | Resp 20 | Ht 68.0 in | Wt 274.7 lb

## 2022-09-09 DIAGNOSIS — M791 Myalgia, unspecified site: Secondary | ICD-10-CM | POA: Insufficient documentation

## 2022-09-09 DIAGNOSIS — M549 Dorsalgia, unspecified: Secondary | ICD-10-CM | POA: Insufficient documentation

## 2022-09-09 NOTE — Progress Notes (Signed)
[facility]  [address]  [place], [place] [address]  [phone]    Progress Note    Sumit Torosian  MRN: [id number]  DOB: Sep 15, 1955  Date of Service: 09/09/2022    CHIEF COMPLAINT  Chief Complaint   Patient presents with    Follow Up After Injection        SUBJECTIVE  Jaquaris Mascarenas is a 67 y.o. male who presents to clinic for procedure follow up. Patient had bilateral L4-S1 RFA with Dr. Shawnie Pons performed on 08/07/22. Patient endorses 95% relief  still lasting . Denies any side effects or complication with the procedure. Pt reports going to the grocery store and going to the gym are easier post procedure.  Patient reports having pain in paraspinal muscles of the mid-back after prolonged walking. Patient states he's been working out for 2 hours, 3 days a week, and has been walking everyday. Patient reports brief "nerve pain" in left lumbar area after completing "bridge stretch," but states the pain isn't as severe as it once was and subsides quickly. Pt denies any red flag signs today. Overall, patient is very satisfied with relief.     Previous update from 06/22/22:     HPI: 67 y.o. male contacted today via telemedicine for injection follow up. Patient had Right L4-S1 LMBB #2 with Dr. Shawnie Pons performed on 05/08/22 and Left L4-S1 LMBB #2 with Dr. Shawnie Pons on 05/12/22. Patient endorses 90-95% relief 1 day(s). Denies any side effects or complication with the procedure. Walking and getting in and out of bed was easier after both procedures. Had L THA last Monday. Now hx of Right and Left Hip. He reports the procedure went well and he is ambulating easier. He reports Dr. Graciela Husbands instructed him to wait for RFA until 6 weeks post op of his L THA. He denies red flag signs today.      Previous Update 04/21/22:  Oldrich Nilo is a 67 y.o. male who presents to clinic for procedure follow up. Patient had Right L4-S1 LMBB #1 with Dr. Shawnie Pons performed on 03/31/22 and Left L4-S1 LMBB  #1 on 04/07/22. Patient endorses 90% relief 1 day(s) for both procedures. Denies any side effects or complication with the procedure.  Standing, walking for longer periods of time were easier post procedure. He is getting a Left THA on April 29th.       Description: aching  Timing: constant   Location: low back, left hip  Onset: years   Radiating: no  Worse with: movement, bending  Better with: laying flat   Treatments tried: Tylenol  Red flag: denies dropping objects, denies issues with dexterity, denies bowel or bladder incontinence , and denies saddle anesthesia.    TREATMENT HISTORY:        Helpful Not Helpful Not Tried  Comments   MBB  X     Patient had Right L4-S1 LMBB #1 with Dr. Shawnie Pons performed on 03/31/22 and Left L4-S1 LMBB #1 on 04/07/22. Patient endorses 90% relief 1 day(s) for both procedures.     RFA/Cryoablation           Epidural X     07/22/21 L4/5 LESI 50% relief for couple months   Transforaminal/  Nerve root block            SI injection           Intraarticular Joint            Sympathetic Block  Other            TPI           GTB            SCS           Pain pump              Physical Therapy or HEP            Massage Therapy           Acupuncture            Chiropractor           Exercise Physiologist            Counseling/CBT                Medications:   Acetaminophen 500 mg Oral Capsule, Take 1 Cap (500 mg total) by mouth Every 6 hours as needed  atorvastatin (LIPITOR) 40 mg Oral Tablet, Take 1 Tablet (40 mg total) by mouth Once a day (Patient taking differently: Take 1 Tablet (40 mg total) by mouth Once a day am)  diclofenac sodium (VOLTAREN) 75 mg Oral Tablet, Delayed Release (E.C.), Take 1 Tablet (75 mg total) by mouth Twice daily  dicyclomine (BENTYL) 20 mg Oral Tablet, Take 1 Tablet (20 mg total) by mouth Four times a day  GLUCOS CHOND CPLX ADVANCED ORAL, Take 2 Tabs by mouth Once a day Glucosamine-Chondroitin 1500 mg  hydroCHLOROthiazide (HYDRODIURIL) 25 mg Oral Tablet, Take 1  Tablet (25 mg total) by mouth Once a day (Patient taking differently: Take 1 Tablet (25 mg total) by mouth Once a day am)  losartan (COZAAR) 100 mg Oral Tablet, Take 1 Tablet (100 mg total) by mouth Once a day  metoprolol tartrate (LOPRESSOR) 50 mg Oral Tablet, Take 1 Tablet (50 mg total) by mouth Twice daily  metroNIDAZOLE 1 % Gel with Pump, by Apply externally route Once a day  multivitamin Oral Tablet, Take 1 Tablet by mouth Once a day  Non-Formulary/Special Preparation (MEDICATION HELP), CBD 2 capsules per day   Omeprazole 20 mg Oral Tablet, Delayed Release (E.C.), Take by mouth Once a day am  triamcinolone acetonide 0.1 % Cream,     No facility-administered medications prior to visit.      Allergies:   Allergies   Allergen Reactions    Sulfa (Sulfonamides) Rash    Shellfish Containing Products Itching and Swelling     Shrimp - eye swelling, itching       Review of Systems:   CONSTITUTIONAL: Denies weight change. Denies fever.   GI: Denies bowel incontinence.  GU: Denies urinary incontinence or retention.  NEUROLOGICAL: Denies paralysis. Denies tremors    PSYCHIATRIC: Denies acute changes in mood.  MUSCULOSKELETAL: See HPI.    OBJECTIVE  BP (!) 167/77   Pulse 64   Temp 36.2 C (97.2 F)   Resp 20   Ht 1.727 m (5\' 8" )   Wt 125 kg (274 lb 11.1 oz)   SpO2 98%   BMI 41.77 kg/m           General: no distress   Abdomen: soft, non-tender and non-distended   Respiratory: No increased work of breathing. No increased accessory muscle use   Cardiovascular: acyanotic, no JVD  Skin: warm and dry, no rash  Neurologic: gait is antalgic , motor 5/5 and sensory intact in b/l UEs, AOx3, CN 2-12 grossly intact, negative Hoffman's b/l  Deep Tendon Reflexes    Brachioradialis  Bicep Patellar Achilles   Right  2+ 2+ 2+ 2+   Left 2+ 2+ 2+ 2+     Psychiatric: normal affect and behavior  Musculoskeletal: no cyanosis or edema      Lumbar   ROM: Preserved   Palpation: Non-tender   Motor: 5/5 in LEs b/l   Sensory: Intact in LEs b/l    Facet loading: Negative bilaterally    Facet TTP: Negative bilaterally    Trigger points:  Lumbar paraspinal bilateral   Straight leg raise:  Negative bilaterally      SI Joint   Palpation: Non-tender bilaterally       Nursing Notes:   Lonna Duval, Ambulatory Care Assistant  09/09/22 1102  Signed  Procedure Follow Up  Shyhiem Suddarth [facility]  [id number]  Chief Complaint   Patient presents with    Follow Up After Injection        [place] Pain Rating Scale     On a scale of 0-10, during the past 24 hours, pain has interfered with you usual activity: 1     On a scale of 0-10, during the past 24 hours, pain has interfered with your sleep: 0    On a scale of 0-10, during the past 24 hours, pain has affected your mood: 0     On a scale of 0-10, during the past 24 hours, pain has contributed to your stress: 0     On a scale of 0-10, what is your overall pain Rating: 1      Vitals:    09/09/22 1054   BP: (!) 167/77   Pulse: 64   Resp: 20   Temp: 36.2 C (97.2 F)   SpO2: 98%   Weight: 125 kg (274 lb 11.1 oz)   Height: 1.727 m (5\' 8" )   PainSc:   1     Body mass index is 41.77 kg/m.    New imaging:  None  Patient is here today S/ P BL RFA that was done on 08/07/2022 and reports 95 % relief that lasted  Patient reports that it is still currently lasting .    Lonna Duval, Ambulatory Care Assistant  09/09/2022, 11:01       Imaging: Reviewed    Recent Results (from the past [id number] hour(s))   MRI SPINE LUMBOSACRAL WO CONTRAST    Collection Time: 06/03/20 10:27 AM    Narrative    CLINICAL INFORMATION:  Nolyn MICHAEL Fifer  Male, 67 years old.    MRI SPINE LUMBOSACRAL WO CONTRAST performed on 06/03/2020 10:27 AM.    REASON FOR EXAM:  M54.16: Lumbar radiculopathy    COMPARISON: Radiographs of the lumbar spine 05/05/2020.    TECHNIQUE: MRI of the lumbosacral spine was performed  without contrast. 3 plane localizer, sagittal T2, sagittal T1, coronal T2, coronal STIR, axial T2 and axial T1 sequences were obtained.    FINDINGS:    Straightening of the lumbar lordosis. Grade 1 anterolisthesis of L4 on L5. Vertebral body heights are normal. Marrow signal is heterogeneous without evidence of an aggressive marrow replacing process. Mild disc height loss is noted at L4-L5.    The conus ends at L1-L2. No clumping or thickening of the cauda equina.     Level by level evaluation is as follows:    T12-L1: No spinal canal or neural foraminal stenosis. There is mild disc bulging and bilateral facet hypertrophy.  L1-L2: No spinal canal or neural foraminal stenosis. Mild disc bulging and bilateral facet hypertrophy.  L2-L3: Disc bulging and moderate ligamentum flavum thickening result in mild spinal canal stenosis. Moderate bilateral facet hypertrophy results in mild bilateral neural foraminal stenosis.    L3-L4: Disc bulging and mild facet hypertrophy result in mild spinal canal stenosis. Posterior endplate osteophytes and moderate facet hypertrophy result in mild bilateral neural foraminal stenosis.    L4-L5: Diffuse disc bulging and advanced ligamentum flavum thickening results in severe spinal canal stenosis. There is effacement of the bilateral subarticular recesses with contact of the traversing bilateral L5 nerve roots. Advanced bilateral facet hypertrophy results in moderate bilateral neural foraminal stenosis. Far lateral disc bulging results in contact of the exiting bilateral L4 nerve roots.    L5-S1: Diffuse disc bulging and mild ligamentum flavum thickening do not result in spinal canal stenosis. Posterior endplate osteophytes and moderate facet hypertrophy results in moderate bilateral neural foraminal stenosis. Far lateral disc bulging results in contact of the exiting right L5 nerve root.      Prevertebral soft tissues are normal.        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 There is associated effacement of the subarticular recesses with contact of the traversing  bilateral L5 nerve roots.  2.Up to moderate neural foraminal stenosis of the bilateral L4-L5 and L5-S1 levels.  3.Far lateral disc bulging contacts the exiting bilateral L4 and exiting right L5 nerve roots.                ASSESSMENT    ICD-10-CM    1. Mid back pain  M54.9 Refer to [facility]     [phone] - INJ SINGLE OR MULTIPLE TRIGGER POINTS, 3 OR MORE MUSCLE -No Imaging; [id number] Prior Auth/Scheduling Procedure     20553 - INJ SINGLE OR MULTIPLE TRIGGER POINTS, 3 OR MORE MUSCLE (AMB ONLY)      2. Myalgia  M79.10 Refer to [facility]     [phone] - INJ SINGLE OR MULTIPLE TRIGGER POINTS, 3 OR MORE MUSCLE -No Imaging; 28413 Prior Auth/Scheduling Procedure     20553 - INJ SINGLE OR MULTIPLE TRIGGER POINTS, 3 OR MORE MUSCLE (AMB ONLY)            PLAN/RECOMMENDATION  Orders Placed This Encounter    24401 - INJ SINGLE OR MULTIPLE TRIGGER POINTS, 3 OR MORE MUSCLE (AMB ONLY)    Refer to [facility]    [phone] - INJ SINGLE OR MULTIPLE TRIGGER POINTS, 3 OR MORE MUSCLE -No Imaging; 36644 Prior Auth/Scheduling Procedure       1. Referral to movement specialist upstairs for back exercises and strengthening for thoracic paraspinals.  2. TPIs ordered for patient for mid-back muscle pain PRN. Instructed pt after 2-3 weeks of working with our movement specialist, if he continues to have mid-back pain he may call and schedule the TPIs. Pt confirmed understanding.   3. Reviewed and discussed patient's hip and back X-rays. Answered all questions.   4. Follow up after TPIs, or sooner if needed.   5. Discussed with patient that RFA can be repeated in 6 months as pain returns.   6. Patient informed of red flags symptoms like caudal anesthesia, bowel, bladder incontinence including the inability to urinate, weakness of legs that they need to go to the ED emergently for possible impingement of the spinal nerve roots and may need urgent decompression.  Patient confirmed understanding.      Our impression,  treatment recommendations and plan from today's visit were reviewed  in detail with the patient in the office. All of the patient's questions were answered. If a procedure was ordered, it was explained using a spine model,  including technique, benefits, alternatives and the associated risks.   The patient verbalized understanding of the above plan and the patient wishes to move forward with the above noted plan.    Marylene Land, STUDENT NURSE PRACTITIONER 09/09/2022, 11:43     I personally examined and assessed the patient. I agree with the APP's or student's findings and treatment plan. Any changes were made at the time of signing.     [name], FNP-C 09/09/2022, 11:50

## 2022-09-09 NOTE — Nursing Note (Signed)
Procedure Follow Up  Henry [name] [facility]  [id number]  Chief Complaint   Patient presents with    Follow Up After Injection        [place] Pain Rating Scale     On a scale of 0-10, during the past 24 hours, pain has interfered with you usual activity: 1     On a scale of 0-10, during the past 24 hours, pain has interfered with your sleep: 0    On a scale of 0-10, during the past 24 hours, pain has affected your mood: 0     On a scale of 0-10, during the past 24 hours, pain has contributed to your stress: 0     On a scale of 0-10, what is your overall pain Rating: 1      Vitals:    09/09/22 1054   BP: (!) 167/77   Pulse: 64   Resp: 20   Temp: 36.2 C (97.2 F)   SpO2: 98%   Weight: 125 kg (274 lb 11.1 oz)   Height: 1.727 m (5\' 8" )   PainSc:   1     Body mass index is 41.77 kg/m.    New imaging:  None  Patient is here today S/ P BL RFA that was done on 08/07/2022 and reports 95 % relief that lasted  Patient reports that it is still currently lasting .    Lonna Duval, Ambulatory Care Assistant  09/09/2022, 11:01

## 2022-09-15 ENCOUNTER — Encounter (INDEPENDENT_AMBULATORY_CARE_PROVIDER_SITE_OTHER): Payer: Self-pay | Admitting: Chiropractor

## 2022-09-15 ENCOUNTER — Ambulatory Visit (INDEPENDENT_AMBULATORY_CARE_PROVIDER_SITE_OTHER): Payer: Medicare Other | Admitting: Chiropractor

## 2022-09-15 ENCOUNTER — Other Ambulatory Visit: Payer: Self-pay

## 2022-09-15 VITALS — BP 144/86 | HR 70 | Temp 98.2°F | Resp 20 | Ht 67.99 in | Wt 275.6 lb

## 2022-09-15 DIAGNOSIS — M9902 Segmental and somatic dysfunction of thoracic region: Secondary | ICD-10-CM

## 2022-09-15 DIAGNOSIS — M791 Myalgia, unspecified site: Secondary | ICD-10-CM

## 2022-09-15 DIAGNOSIS — M549 Dorsalgia, unspecified: Secondary | ICD-10-CM

## 2022-09-15 NOTE — Progress Notes (Signed)
[facility]  [address], [address]  [place], [place] [address]  [phone]          Name: Henry [name]  MRN: [id number]  Date:09/15/2022  Birthday: [birth year]          CHIEF COMPLAINT  Chief Complaint   Patient presents with    Back Pain    Myalgia         HPI  This patient is a 67 y.o. year old male who presents for evaluation and management of mid-back pain which has been bothering him intermittently over the past 5 weeks. He states that it tends to start bothering him after he walks for more than 15 minutes. He is under the care of the Pain Clinic for his back pain. He had an RFA on 08-07-22 and still reports good relief from it.      Duration of symptoms: 5 weeks  Onset: insidious  Quality: nagging  Frequency: intermittent  Timing: the symptom is not worse at any particular time of day  Provocative: walking  Palliative: resting, stretching  Radiation/Referral: no  Pain with coughing/sneezing/using bathroom: no  Bowel/bladder dysfunction: no  Fever/chills: no  Extremity numbness/tingling: no  Extremity weakness: no   Affected activities of daily living:  Can perform, but it sometimes increases his pain.  Past treatments and results:  Past treatments: none      Good results with none               Poor or minimal results with none  Imaging for this complaint:  Nothing recent of the thoracic spine.  Recent accidents or surgeries: no  Significant past medical history: Prior hip replacements.         MEDICAL HISTORY  Current Outpatient Medications   Medication Sig    Acetaminophen 500 mg Oral Capsule Take 1 Cap (500 mg total) by mouth Every 6 hours as needed    atorvastatin (LIPITOR) 40 mg Oral Tablet Take 1 Tablet (40 mg total) by mouth Once a day (Patient taking differently: Take 1 Tablet (40 mg total) by mouth Once a day am)    diclofenac sodium (VOLTAREN) 75 mg Oral Tablet, Delayed Release (E.C.) Take 1 Tablet (75 mg total) by mouth Twice daily    dicyclomine (BENTYL) 20  mg Oral Tablet Take 1 Tablet (20 mg total) by mouth Four times a day    GLUCOS CHOND CPLX ADVANCED ORAL Take 2 Tabs by mouth Once a day Glucosamine-Chondroitin 1500 mg    hydroCHLOROthiazide (HYDRODIURIL) 25 mg Oral Tablet Take 1 Tablet (25 mg total) by mouth Once a day (Patient taking differently: Take 1 Tablet (25 mg total) by mouth Once a day am)    losartan (COZAAR) 100 mg Oral Tablet Take 1 Tablet (100 mg total) by mouth Once a day    metoprolol tartrate (LOPRESSOR) 50 mg Oral Tablet Take 1 Tablet (50 mg total) by mouth Twice daily    metroNIDAZOLE 1 % Gel with Pump by Apply externally route Once a day    multivitamin Oral Tablet Take 1 Tablet by mouth Once a day    Non-Formulary/Special Preparation (MEDICATION HELP) CBD 2 capsules per day     Omeprazole 20 mg Oral Tablet, Delayed Release (E.C.) Take by mouth Once a day am    triamcinolone acetonide 0.1 % Cream      Allergies   Allergen Reactions    Sulfa (Sulfonamides) Rash    Shellfish Containing Products Itching and Swelling  Shrimp - eye swelling, itching     Past Medical History:   Diagnosis Date    Arthritis     Back problem     Cancer (CMS HCC) 11/2015    prostate cancer, melanoma back neck    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)     Chronic pain     CPAP (continuous positive airway pressure) dependence     CPAP 9 cm H2O    CPAP (continuous positive airway pressure) dependence     Deep vein thrombosis (DVT) (CMS HCC)     left leg    Eczema     GERD (gastroesophageal reflux disease)     well controlled on omeprazole    Heartburn     HTN     Hyperlipidemia     Hypertension     Irritable bowel syndrome     Melanoma (CMS HCC)     Morbid obesity with BMI of 40.0-44.9, adult (CMS HCC)     Neck problem     obstructive sleep apnea 01/2010    AHI 53.8    Osteoarthritis of back     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 Graciela Husbands    PB COLONOSCOPY,DIAGNOSTIC           Family Medical History:       Problem Relation (Age of Onset)    Asthma Father    Atherosclerosis Paternal Grandmother    Breast Cancer Maternal Grandmother    COPD Father    Cancer Mother, Father    Coronary Artery Disease Mother    Diabetes Mother    High Cholesterol Mother    Hypertension (High Blood Pressure) Mother    Melanoma Brother    Migraines Mother    Other Maternal Grandfather    Prostate Cancer Paternal Grandfather    Stomach Cancer Maternal Grandmother            Social History     Socioeconomic History    Marital status: Married    Number of children: 0   Occupational History     Employer: [employer]    Occupation: retired     Comment: formerly worked administration in [employer]   Tobacco Use    Smoking status: Former     Types: Cigars     Quit date: 2012     Years since quitting: 12.5    Smokeless tobacco: Never    Tobacco comments:     quit cigars in 2012   Vaping Use    Vaping status: Never Used   Substance and Sexual Activity    Alcohol use: Yes     Alcohol/week: 2.0 standard drinks of alcohol     Types: 2 Cans of beer per week     Comment: per week    Drug use: No     Comment: capsule   Other Topics Concern    Abuse/Domestic Violence No    Caffeine Concern No    Calcium intake adequate Yes  Computer Use Yes    Drives Yes    Exercise Concern No    Seat Belt Yes    Special Diet No    Sunscreen used Yes    Right hand dominant Yes    Ability to Walk 1 Flight of Steps without SOB/CP Yes    Routine Exercise Yes     Comment: [facility], 3-4 times a week, 2hrs.    Ability to Walk 2 Flight of Steps without SOB/CP Yes    Ability To Do Own ADL's Yes    Other Activity Level Yes     Comment: 14 steps at home. walks a lot. shoveled snow this winter. limited by pain.     Social Determinants of Health     Social  Connections: Low Risk  (06/15/2022)    Social Connections     SDOH Social Isolation: 5 or more times a week         REVIEW OF SYSTEMS  Constitutional: Negative for fever   HENT: Negative  Eyes: Negative  Respiratory: Negative  Cardiovascular: Negative for chest pain  Gastrointestinal: Negative for abdominal pain and bowel incontinence  Endocrine: Negative  Genitourinary: Negative for bladder incontinence, dysuria, and pelvic pain.  Musculoskeletal: Positive for back pain  Skin: Negative  Neurological: Negative  Psychiatric/behavioral: Negative        EXAMINATION  BP (!) 144/86   Pulse 70   Temp 36.8 C (98.2 F) (Thermal Scan)   Resp 20   Ht 1.727 m (5' 7.99")   Wt 125 kg (275 lb 9.2 oz)   SpO2 96%   BMI 41.91 kg/m         Mental status: Alert and oriented x3  Inspection:  No noted deformities or signs of inflammation.   Respiration:  within normal limits  Neurologic:    Cranial nerves 2-12 are grossly intact.      Upper extremity DTR's: biceps, brachioradialis, and triceps all 2+ bilaterally    UE muscle testing:  deltoid, biceps, wrist extension, wrist flexion, finger extension, finger abduction, and finger adduction all 5/5 bilaterally  UE sensory:                            Grossly intact.  Lower extremity DTR's: patellar and achilles 2+ bilaterally    LE muscle testing:  iliopsoas, quadraceps, tibialis anterior, external hallucis longus, and peroneus longus and brevis are all 5/5 bilaterally, heel/toe walk within normal limits   LE sensory:                             Grossly intact.     Orthopedic:  Lumbopelvic ROM:  Decreased.  Seated Lasegue's: Right: negative. Left: negative.        Palpation:    Joint mobility restrictions: T6-T7  Tenderness: Mid-thoracic spine.  Trigger points: Thoracic paraspinals.  Hypertonicity: Thoracic paraspinals.    Outcomes Assessments:    [place] Pain Rating Scale     On a scale of 0-10, during the past 24 hours, pain has interfered with you usual activity: 4     On a scale of  0-10, during the past 24 hours, pain has interfered with your sleep: 0    On a scale of 0-10, during the past 24 hours, pain has affected your mood: 0     On a scale of 0-10, during the past 24 hours, pain has contributed to your stress:  0     On a scale of 0-10, what is your overall pain Rating: 7         IMPRESSION      ICD-10-CM    1. Segmental dysfunction of thoracic region  M99.02       2. Mid back pain  M54.9       3. Myalgia  M79.10             TREATMENT PLAN  Utilize chiropractic manipulation [phone] or [id number]) to improve joint mobility and function, soft tissue techniques to improve soft tissue mobility/flexibility (97140), and therapeutic exercise to improve strength, stability, global range of motion and proper movement patterns (97110, 97530).  Adjunct therapies may include taping or bracing procedures and activities of daily living training (56387) as needed to support the results of the other interventions.    Specific focus: Thoracic spine.    Schedule of care: 2x/week, 4 weeks    Short term goals: Decrease pain from a 7/10 to a 5/10 in 4 weeks.                               Increase lumbar ROM in order to perform ADL with less pain in 4 weeks.    Long term goals: Independent with HEP in 8 weeks.    TODAY'S TREATMENT  Chiropractic manipulation of T6 to improve joint function and mobility.   Start movement therapy next visit.  Pt tolerated today's visit well and reported feeling a little better.    CPT Codes: 56433 and [phone].    Arlyn Leak, DC

## 2022-09-17 ENCOUNTER — Ambulatory Visit (INDEPENDENT_AMBULATORY_CARE_PROVIDER_SITE_OTHER): Payer: Self-pay

## 2022-09-17 ENCOUNTER — Encounter (HOSPITAL_BASED_OUTPATIENT_CLINIC_OR_DEPARTMENT_OTHER): Payer: Self-pay | Admitting: Student in an Organized Health Care Education/Training Program

## 2022-09-17 ENCOUNTER — Other Ambulatory Visit: Payer: Self-pay

## 2022-09-17 DIAGNOSIS — M791 Myalgia, unspecified site: Secondary | ICD-10-CM

## 2022-09-17 DIAGNOSIS — M549 Dorsalgia, unspecified: Secondary | ICD-10-CM

## 2022-09-17 DIAGNOSIS — M47816 Spondylosis without myelopathy or radiculopathy, lumbar region: Secondary | ICD-10-CM

## 2022-09-17 DIAGNOSIS — M9902 Segmental and somatic dysfunction of thoracic region: Secondary | ICD-10-CM

## 2022-09-17 NOTE — Progress Notes (Signed)
[facility]  [address]       [place] [address]  [phone]            Henry [name]  YQM:[id number]  Date:09/17/2022  Birthday:Dec 13, 1955         TODAY'S TREATMENT    Treatment included:  Consultation  Therapeutic exercise    The pt presented today per referral from Henry Oxford, FNP-C for "back exercises and strengthening for thoracic paraspinals."    67 year old male  Referred for/Location of symptoms: Thoracic Spine       The following HPI Note is from Dr. Merrilee [name], DC on 09-15-2022:  HPI  This patient is a 67 y.o. year old male who presents for evaluation and management of mid-back pain which has been bothering him intermittently over the past 5 weeks. He states that it tends to start bothering him after he walks for more than 15 minutes. He is under the care of the Pain Clinic for his back pain. He had an RFA on 08-07-22 and still reports good relief from it.      Duration of symptoms: 5 weeks  Onset: insidious  Quality: nagging  Frequency: intermittent  Timing: the symptom is not worse at any particular time of day  Provocative: walking  Palliative: resting, stretching  Radiation/Referral: no  Pain with coughing/sneezing/using bathroom: no  Bowel/bladder dysfunction: no  Fever/chills: no  Extremity numbness/tingling: no  Extremity weakness: no   Affected activities of daily living:  Can perform, but it sometimes increases his pain.  Past treatments and results:     Past treatments: none                                                      Good results with none                                                      Poor or minimal results with none  Imaging for this complaint:  Nothing recent of the thoracic spine.  Recent accidents or surgeries: no  Significant past medical history: Prior hip replacements.     Subjective Note from Henry [name], [place] on 09-09-2022  SUBJECTIVE  Henry [name] is a 67 y.o. male  who presents to clinic for procedure follow up. Patient had bilateral L4-S1 RFA with Dr. Shawnie [name] performed on 08/07/22. Patient endorses 95% relief  still lasting . Denies any side effects or complication with the procedure. Pt reports going to the grocery store and going to the gym are easier post procedure.  Patient reports having pain in paraspinal muscles of the mid-back after prolonged walking. Patient states he's been working out for 2 hours, 3 days a week, and has been walking everyday. Patient reports brief "nerve pain" in left lumbar area after completing "bridge stretch," but states the pain isn't as severe as it once was and subsides quickly. Pt denies any red flag signs today. Overall, patient is very satisfied with relief.   ______________________________________________________________    EXERCISE PLAN:  Continue HEP. Stop exercises if pain or discomfort  increases.  Return next visit to begin Corrective Exercises - APCF.    [email or web address]  Access Code: [email or web address]     2x/day each day    Exercises     Wt   Set   Rep   Duration/Notes   Supine Thoracic Foam Roller    1-2 minutes   PPTs  1 15 5" holds   Seated Thoracic EXT with Towel  1 5 10" holds                                     <<< = NEW EXERCISE / INCREASED  HELD = NOT COMPLETED   = DISCHARGED    B = blue, Y = yellow, R = red, G = green, Bk = black   Db = dumbbell, T = theraband, CF = cuff weight, M = medicine ball, Kb = kettle bell, PB = physio-ball, K = keiser machine, TM = treadmill  B/L = bilateral, S/L = single-leg, LTR = lateral trunk rotation      TREATMENT TIME:  Total Time: 35 minutes  Intake/Programming: 15 minutes  Exercise: 20 minutes    On the day of the encounter, a total of 35 minutes was spent on this patient encounter including review of HPI note information, therapeutic exercise, and post-visit goals.    Henry [name], EP-C 09/17/2022, 11:14

## 2022-09-21 ENCOUNTER — Other Ambulatory Visit: Payer: Self-pay

## 2022-09-21 ENCOUNTER — Encounter (HOSPITAL_BASED_OUTPATIENT_CLINIC_OR_DEPARTMENT_OTHER): Payer: Self-pay | Admitting: FAMILY MEDICINE

## 2022-09-21 ENCOUNTER — Ambulatory Visit
Payer: Medicare Other | Attending: Student in an Organized Health Care Education/Training Program | Admitting: FAMILY MEDICINE

## 2022-09-21 VITALS — BP 136/84 | HR 80 | Temp 96.8°F | Ht 67.99 in | Wt 273.6 lb

## 2022-09-21 DIAGNOSIS — R0982 Postnasal drip: Secondary | ICD-10-CM | POA: Insufficient documentation

## 2022-09-21 MED ORDER — FLUTICASONE PROPIONATE 50 MCG/ACTUATION NASAL SPRAY,SUSPENSION
2.0000 | Freq: Every day | NASAL | 1 refills | Status: DC
Start: 2022-09-21 — End: 2022-11-13

## 2022-09-21 NOTE — Progress Notes (Signed)
[facility]  [address]  [place] [address]  Operated by [facility]  Return Patient Visit    Name: Henry [name] MRN:  [id number]   Date: 09/21/2022 DOB:  May 24, 1955 (67 y.o.)   PCP: Marchelle Gearing, MD    Chief Complaint   Patient presents with    Sinus Drainage    Cough     Subjective   Henry [name] is a 67 y.o. male who presents to clinic for acute visit.     6 weeks   Nasal drainage  Had URI symptoms, symptoms improved but nasal drainage continued  Clear  Cough dry   No fever, SOB, chest pain, facial pain   Non smoker   Tried allegra with partial improvement           Medication List  Acetaminophen 500 mg Oral Capsule, Take 1 Cap (500 mg total) by mouth Every 6 hours as needed  atorvastatin (LIPITOR) 40 mg Oral Tablet, Take 1 Tablet (40 mg total) by mouth Once a day (Patient taking differently: Take 1 Tablet (40 mg total) by mouth Once a day am)  diclofenac sodium (VOLTAREN) 75 mg Oral Tablet, Delayed Release (E.C.), Take 1 Tablet (75 mg total) by mouth Twice daily  dicyclomine (BENTYL) 20 mg Oral Tablet, Take 1 Tablet (20 mg total) by mouth Four times a day  GLUCOS CHOND CPLX ADVANCED ORAL, Take 2 Tabs by mouth Once a day Glucosamine-Chondroitin 1500 mg  hydroCHLOROthiazide (HYDRODIURIL) 25 mg Oral Tablet, Take 1 Tablet (25 mg total) by mouth Once a day (Patient taking differently: Take 1 Tablet (25 mg total) by mouth Once a day am)  losartan (COZAAR) 100 mg Oral Tablet, Take 1 Tablet (100 mg total) by mouth Once a day  metoprolol tartrate (LOPRESSOR) 50 mg Oral Tablet, Take 1 Tablet (50 mg total) by mouth Twice daily  metroNIDAZOLE 1 % Gel with Pump, by Apply externally route Once a day  multivitamin Oral Tablet, Take 1 Tablet by mouth Once a day  Non-Formulary/Special Preparation (MEDICATION HELP), CBD 2 capsules per day   Omeprazole 20 mg Oral Tablet, Delayed Release (E.C.), Take by mouth Once a day am  triamcinolone acetonide 0.1 %  Cream,   amoxicillin (AMOXIL) 500 mg Oral Capsule, Take 4 Capsules (2,000 mg total) by mouth One time for 1 dose 1 hour prior to dental procedure    No facility-administered medications prior to visit.    Objective   BP 136/84 (Site: Right Arm, Patient Position: Sitting, Cuff Size: Adult Large)   Pulse 80   Temp 36 C (96.8 F) (Thermal Scan)   Ht 1.727 m (5' 7.99")   Wt 124 kg (273 lb 9.5 oz)   SpO2 95%   BMI 41.61 kg/m     Physical Exam  Constitutional:       Appearance: Normal appearance.   HENT:      Right Ear: Tympanic membrane, ear canal and external ear normal.      Left Ear: Tympanic membrane, ear canal and external ear normal.      Nose: Rhinorrhea present.      Mouth/Throat:      Pharynx: Oropharynx is clear. No oropharyngeal exudate.   Eyes:      Conjunctiva/sclera: Conjunctivae normal.   Cardiovascular:      Rate and Rhythm: Normal rate and regular rhythm.      Heart sounds: Normal heart sounds. No murmur heard.  Pulmonary:  Effort: Pulmonary effort is normal. No respiratory distress.      Breath sounds: Normal breath sounds.   Lymphadenopathy:      Cervical: No cervical adenopathy.         Assessment & Plan    Henry [name] is a 67 y.o. male who was seen today for:    (R09.82) Post-nasal drip  (primary encounter diagnosis)  Plan: fluticasone propionate (FLONASE) 50         mcg/actuation Nasal Spray, Suspension  Likely postnasal drip.  First trying Flonase.  Continue Allegra.  Use over-the-counter nasal saline spray    Clinical presentation not suggestive of bacterial rhinosinusitis.  However with no improvement with the current management, may consider        Return if symptoms worsen or fail to improve.    Sharon Mt, MD    I discussed the patient's care with the Fellow prior to the patient leaving the clinic. Any significant discussion points are noted .    Marchelle Gearing, MD

## 2022-09-25 ENCOUNTER — Ambulatory Visit (INDEPENDENT_AMBULATORY_CARE_PROVIDER_SITE_OTHER): Payer: Self-pay

## 2022-09-25 ENCOUNTER — Other Ambulatory Visit: Payer: Self-pay

## 2022-09-25 DIAGNOSIS — M9902 Segmental and somatic dysfunction of thoracic region: Secondary | ICD-10-CM

## 2022-09-25 DIAGNOSIS — M791 Myalgia, unspecified site: Secondary | ICD-10-CM

## 2022-09-25 DIAGNOSIS — M545 Low back pain, unspecified: Secondary | ICD-10-CM

## 2022-09-25 DIAGNOSIS — M549 Dorsalgia, unspecified: Secondary | ICD-10-CM

## 2022-09-25 DIAGNOSIS — M47816 Spondylosis without myelopathy or radiculopathy, lumbar region: Secondary | ICD-10-CM

## 2022-09-25 NOTE — Progress Notes (Signed)
[facility]  [address]       [place] [address]  [phone]            Henry [name]  YQM:[id number]  Date:09/25/2022  Birthday:11/03/1955         TODAY'S TREATMENT    Treatment included:  Therapeutic exercise    67 year old male  Referred for/Location of symptoms: Thoracic Spine    The pt returned today per referral from [name], FNP-C for "back exercises and strengthening for thoracic paraspinals."    The pt reported he is doing well with his exercises and has been doing them alone with his [facility] [employer] exercises. He reported feeling okay following today's new exercises and changes below.    ______________________________________________________________    EXERCISE PLAN:  Continue HEP. Stop exercises if pain or discomfort increases.  Return next visit to begin Corrective Exercises - APCF.    [email or web address]  Access Code: [email or web address]     2x/day each day    Exercises     Wt   Set   Rep   Duration/Notes   Supine Thoracic Foam Roller    1-2 minutes   Supine PPTs Holds  1 15 5" holds   Supine PPT March  1 20 continuous count   Thoracic Wall Angel with Towel Roll  1 10 Hand towel   Bent Over Db Shoulder Ts 5# Db 2 10 B/L - with table support   Bent Over Db Scaption 5# Db 2 10 B/L - with table support                <<< = NEW EXERCISE / INCREASED  HELD = NOT COMPLETED   = DISCHARGED    B = blue, Y = yellow, R = red, G = green, Bk = black   Db = dumbbell, T = theraband, CF = cuff weight, M = medicine ball, Kb = kettle bell, PB = physio-ball, K = keiser machine, TM = treadmill  B/L = bilateral, S/L = single-leg, LTR = lateral trunk rotation      TREATMENT TIME:  Exercise: 45 minutes    On the day of the encounter, a total of 45 minutes was spent on this patient encounter including therapeutic exercise, and post-visit goals.    Robb Matar, EP-C 09/25/2022, 12:53

## 2022-09-30 ENCOUNTER — Other Ambulatory Visit (HOSPITAL_BASED_OUTPATIENT_CLINIC_OR_DEPARTMENT_OTHER): Payer: Self-pay | Admitting: Student in an Organized Health Care Education/Training Program

## 2022-09-30 DIAGNOSIS — I10 Essential (primary) hypertension: Secondary | ICD-10-CM

## 2022-10-01 ENCOUNTER — Other Ambulatory Visit: Payer: Self-pay

## 2022-10-01 ENCOUNTER — Ambulatory Visit (INDEPENDENT_AMBULATORY_CARE_PROVIDER_SITE_OTHER): Payer: Self-pay

## 2022-10-01 DIAGNOSIS — M549 Dorsalgia, unspecified: Secondary | ICD-10-CM

## 2022-10-01 DIAGNOSIS — M791 Myalgia, unspecified site: Secondary | ICD-10-CM

## 2022-10-01 DIAGNOSIS — M9902 Segmental and somatic dysfunction of thoracic region: Secondary | ICD-10-CM

## 2022-10-01 NOTE — Progress Notes (Signed)
[facility]  [address]       [place] [address]  [phone]            Henry [name]  QIO:[id number]  Date:10/01/2022  Birthday:10/30/1955         TODAY'S TREATMENT    Treatment included:  Therapeutic exercise    67 year old male  Referred for/Location of symptoms: Thoracic Spine    The pt returned today per referral from [name], FNP-C for "back exercises and strengthening for thoracic paraspinals."    The pt reported he is doing well with his exercises for his thoracic spine, but his lower back on the left has been bothering him a little more. Exercises were adjusted below and the pt reported feeling good with the adjustments.  Pt also completed his [facility] Low Back Stretches today as well.  He reported feeling okay following today's exercises and changes below.    ______________________________________________________________    EXERCISE PLAN:  Continue HEP. Stop exercises if pain or discomfort increases.  Return next visit to continue Corrective Exercises - APCF.  Pt completed his [facility] Low Back Stretches as well:  Double Knees to chest, LTRs, Single-Leg Fall Outs    [email or web address]  Access Code: [email or web address]     2x/day each day    Exercises     Wt   Set   Rep   Duration/Notes   Supine Thoracic Foam Roller    1-2 minutes   Supine PPTs Holds  1 15 5" holds   Supine PPT 05/19/22  1 20 B/L   Supine Dead bug Isometric  1 5 10" holds   Bent Over Db Shoulder Ts 5# Db 2 10 B/L - with table support   Bent Over Db Scaption 5# Db 2 10 B/L - with table support   Resisted Anti-Rotation Press BT 1 20 B/L       <<< = NEW EXERCISE / INCREASED  HELD = NOT COMPLETED   = DISCHARGED    B = blue, Y = yellow, R = red, G = green, Bk = black   Db = dumbbell, T = theraband, CF = cuff weight, M = medicine ball, Kb = kettle bell, PB = physio-ball, K = keiser machine, TM = treadmill  B/L = bilateral, S/L = single-leg, LTR =  lateral trunk rotation      TREATMENT TIME:  Exercise: 45 minutes    On the day of the encounter, a total of 45 minutes was spent on this patient encounter including therapeutic exercise, and post-visit goals.    Robb Matar, EP-C 10/01/2022, 10:15

## 2022-10-06 ENCOUNTER — Ambulatory Visit (INDEPENDENT_AMBULATORY_CARE_PROVIDER_SITE_OTHER): Payer: Self-pay | Admitting: Rehabilitative and Restorative Service Providers"

## 2022-10-06 ENCOUNTER — Other Ambulatory Visit: Payer: Self-pay

## 2022-10-06 DIAGNOSIS — M9902 Segmental and somatic dysfunction of thoracic region: Secondary | ICD-10-CM

## 2022-10-06 NOTE — Progress Notes (Signed)
[facility]  [address]  [place], [place] [address]  [phone]    Name:  Henry [name]  MRN:  [id number]  Date:  10/06/2022  Birthday: 05-May-1955  Age:  67 y.o.  Gender  male    Subjective:  Chief Complaint   Patient presents with    Back Pain     Pain Level:  "Tight"    Objective:  Palpation:  Hypertonicity in bilateral trapezius and paraspinals.    Today's Treatment:  Applied Therapeutic Massage, Trigger Point/Neuromuscular Therapy and Myofascial Release to Affected Areas.    Assessment:  Patient tolerated treatment.    Plan:  Return as needed.    Duration:  20 Min.    Primus Bravo, MT

## 2022-10-08 ENCOUNTER — Other Ambulatory Visit: Payer: Self-pay

## 2022-10-08 ENCOUNTER — Other Ambulatory Visit: Payer: Medicare Other

## 2022-10-08 DIAGNOSIS — R7303 Prediabetes: Secondary | ICD-10-CM | POA: Insufficient documentation

## 2022-10-08 DIAGNOSIS — Z8546 Personal history of malignant neoplasm of prostate: Secondary | ICD-10-CM | POA: Insufficient documentation

## 2022-10-08 DIAGNOSIS — I10 Essential (primary) hypertension: Secondary | ICD-10-CM | POA: Insufficient documentation

## 2022-10-08 DIAGNOSIS — Z08 Encounter for follow-up examination after completed treatment for malignant neoplasm: Secondary | ICD-10-CM | POA: Insufficient documentation

## 2022-10-08 DIAGNOSIS — Z13220 Encounter for screening for lipoid disorders: Secondary | ICD-10-CM | POA: Insufficient documentation

## 2022-10-08 DIAGNOSIS — E7800 Pure hypercholesterolemia, unspecified: Secondary | ICD-10-CM | POA: Insufficient documentation

## 2022-10-08 LAB — COMPREHENSIVE METABOLIC PANEL, NON-FASTING
ALBUMIN: 3.9 g/dL (ref 3.4–4.8)
ALKALINE PHOSPHATASE: 87 U/L (ref 45–115)
ALT (SGPT): 42 U/L (ref 10–55)
ANION GAP: 7 mmol/L (ref 4–13)
AST (SGOT): 27 U/L (ref 8–45)
BILIRUBIN TOTAL: 0.5 mg/dL (ref 0.3–1.3)
BUN/CREA RATIO: 18 (ref 6–22)
BUN: 15 mg/dL (ref 8–25)
CALCIUM: 9.4 mg/dL (ref 8.6–10.3)
CHLORIDE: 104 mmol/L (ref 96–111)
CO2 TOTAL: 28 mmol/L (ref 23–31)
CREATININE: 0.83 mg/dL (ref 0.75–1.35)
ESTIMATED GFR - MALE: >90 mL/min/BSA (ref >=60)
GLUCOSE: 133 mg/dL — ABNORMAL HIGH (ref 65–125)
POTASSIUM: 4.2 mmol/L (ref 3.5–5.1)
PROTEIN TOTAL: 6.7 g/dL (ref 5.6–7.6)
SODIUM: 139 mmol/L (ref 136–145)

## 2022-10-08 LAB — LIPID PANEL
CHOL/HDL RATIO: 3.9
CHOLESTEROL: 136 mg/dL (ref 100–200)
HDL CHOL: 35 mg/dL — ABNORMAL LOW (ref >=50)
LDL CALC: 64 mg/dL (ref <100)
NON-HDL: 101 mg/dL (ref <=190)
TRIGLYCERIDES: 228 mg/dL — ABNORMAL HIGH (ref <150)
VLDL CALC: 34 mg/dL — ABNORMAL HIGH (ref <30)

## 2022-10-08 LAB — HGA1C (HEMOGLOBIN A1C WITH EST AVG GLUCOSE)
ESTIMATED AVERAGE GLUCOSE: 134 mg/dL
HEMOGLOBIN A1C: 6.3 % — ABNORMAL HIGH (ref 4.0–5.6)

## 2022-10-08 LAB — PSA DIAGNOSTIC WITH FREE PSA REFLEX: PSA: 0.33 ng/mL (ref <=4.00)

## 2022-10-13 ENCOUNTER — Other Ambulatory Visit: Payer: Self-pay

## 2022-10-13 ENCOUNTER — Ambulatory Visit (INDEPENDENT_AMBULATORY_CARE_PROVIDER_SITE_OTHER): Payer: Medicare Other | Admitting: Rehabilitative and Restorative Service Providers"

## 2022-10-13 DIAGNOSIS — M9902 Segmental and somatic dysfunction of thoracic region: Secondary | ICD-10-CM

## 2022-10-13 NOTE — Progress Notes (Signed)
[facility]  [address]  [place], [place] [address]  [phone]    Name:  Henry [name]  MRN:  [id number]  Date:  10/13/2022  Birthday: [birth year]  Age:  67 y.o.  Gender  male    Subjective:  Chief Complaint   Patient presents with    Low Back Pain     Pain Level:  "Sore"    Objective:  Palpation:  Hypertonicity in bilateral paraspinals.    Today's Treatment:  Applied Therapeutic Massage, Trigger Point/Neuromuscular Therapy and Myofascial Release to Affected Areas.    Assessment:  Patient tolerated treatment.    Plan:  Return as needed.    Duration:  20 Min.    Primus Bravo, MT

## 2022-10-15 ENCOUNTER — Telehealth (INDEPENDENT_AMBULATORY_CARE_PROVIDER_SITE_OTHER): Payer: Self-pay

## 2022-10-15 ENCOUNTER — Ambulatory Visit (INDEPENDENT_AMBULATORY_CARE_PROVIDER_SITE_OTHER): Payer: Self-pay

## 2022-10-15 ENCOUNTER — Other Ambulatory Visit: Payer: Self-pay

## 2022-10-15 DIAGNOSIS — M9902 Segmental and somatic dysfunction of thoracic region: Secondary | ICD-10-CM

## 2022-10-15 DIAGNOSIS — M791 Myalgia, unspecified site: Secondary | ICD-10-CM

## 2022-10-15 DIAGNOSIS — M549 Dorsalgia, unspecified: Secondary | ICD-10-CM

## 2022-10-15 NOTE — Telephone Encounter (Signed)
Copied from CRM [id number]. Topic: Clinical - Question  >> Oct 15, 2022  2:27 PM Kemper Durie wrote:  Vickie Epley called with a clinical question.   Pt would like to speak to [facility] about rhizotomy, its wearing off and pt is in pain. Please call to advise, thanks, Reece Agar

## 2022-10-15 NOTE — Progress Notes (Signed)
[facility]  [address]       [place] [address]  [phone]            Henry [name]  QQV:[id number]  Date:10/15/2022  Birthday:19-Sep-1955         TODAY'S TREATMENT    Treatment included:  Therapeutic exercise    67 year old male  Referred for/Location of symptoms: Thoracic Spine    The pt returned today per referral from [name], FNP-C for "back exercises and strengthening for thoracic paraspinals."    The pt reported he is doing well with his exercises for his thoracic spine and that his mid-back feels good.  The pt reported he will continue his exercises and expressed wanting to be as needed. He stated he is pleased with treatment.    ______________________________________________________________    EXERCISE PLAN:  Continue HEP. Stop exercises if pain or discomfort increases.    Pt completed his [employer] Low Back Stretches as well:  Double Knees to chest, LTRs, Single-Leg Fall Outs    [email or web address]  Access Code: [email or web address]     2x/day each day    Exercises     Wt   Set   Rep   Duration/Notes   Supine Thoracic Foam Roller    1-2 minutes   Supine PPTs Holds  1 15 5" holds   Supine PPT April 30, 2022  1 20 B/L   Supine Dead bug Isometric  1 5 10" holds   Supine Dead bug  3 5 B/L   Bent Over Db Shoulder Ts 5# Db 2 10 B/L - with table support   Bent Over Db Scaption 5# Db 2 10 B/L - with table support   Resisted Anti-Rotation Stir the Pot BT 1 20 B/L       <<< = NEW EXERCISE / INCREASED  HELD = NOT COMPLETED   = DISCHARGED    B = blue, Y = yellow, R = red, G = green, Bk = black   Db = dumbbell, T = theraband, CF = cuff weight, M = medicine ball, Kb = kettle bell, PB = physio-ball, K = keiser machine, TM = treadmill  B/L = bilateral, S/L = single-leg, LTR = lateral trunk rotation      TREATMENT TIME:  Exercise: 40 minutes    On the day of the encounter, a total of 40 minutes was spent on this patient encounter  including therapeutic exercise, and post-visit goals.    Robb Matar, EP-C 10/15/2022, 15:40

## 2022-10-20 ENCOUNTER — Encounter (INDEPENDENT_AMBULATORY_CARE_PROVIDER_SITE_OTHER): Payer: Self-pay

## 2022-10-20 ENCOUNTER — Ambulatory Visit (HOSPITAL_BASED_OUTPATIENT_CLINIC_OR_DEPARTMENT_OTHER): Payer: Medicare Other

## 2022-10-20 ENCOUNTER — Other Ambulatory Visit: Payer: Self-pay

## 2022-10-20 VITALS — BP 168/73 | HR 67 | Temp 98.2°F | Resp 20 | Ht 67.99 in | Wt 276.4 lb

## 2022-10-20 DIAGNOSIS — Z029 Encounter for administrative examinations, unspecified: Secondary | ICD-10-CM

## 2022-10-20 NOTE — Nursing Note (Signed)
Return Patient Visit    Domingo Mendizabal [facility]    Chief Complaint   Patient presents with    Low Back Pain         [place] Pain Rating Scale     On a scale of 0-10, during the past 24 hours, pain has interfered with you usual activity: 6     On a scale of 0-10, during the past 24 hours, pain has interfered with your sleep: 3    On a scale of 0-10, during the past 24 hours, pain has affected your mood: 2     On a scale of 0-10, during the past 24 hours, pain has contributed to your stress: 2     On a scale of 0-10, what is your overall pain Rating: 5        Vitals:    10/20/22 1314   BP: (!) 168/73   Pulse: 67   Resp: 20   Temp: 36.8 C (98.2 F)   SpO2: 96%   Weight: 125 kg (276 lb 6.4 oz)   Height: 1.727 m (5' 7.99")   PainSc:   5   PainLoc: Back       Body mass index is 42.04 kg/m.    New imaging since last visit:  None  Reason for today's visit: Pt presents for a follow up of lower back pain.     Jerlyn Ly, LPN  09/16/8297, 37:16

## 2022-10-20 NOTE — Progress Notes (Signed)
TPIs scheduled. Patient will re-follow after injection, sooner if needed.

## 2022-10-22 ENCOUNTER — Ambulatory Visit (INDEPENDENT_AMBULATORY_CARE_PROVIDER_SITE_OTHER): Payer: Self-pay | Admitting: Rehabilitative and Restorative Service Providers"

## 2022-10-22 ENCOUNTER — Other Ambulatory Visit: Payer: Self-pay

## 2022-10-22 DIAGNOSIS — M549 Dorsalgia, unspecified: Secondary | ICD-10-CM

## 2022-10-22 DIAGNOSIS — M9902 Segmental and somatic dysfunction of thoracic region: Secondary | ICD-10-CM

## 2022-10-22 NOTE — Progress Notes (Signed)
[facility]  [address]  [place], [place] [address]  [phone]    Name:  Henry [name]  MRN:  [id number]  Date:  10/22/2022  Birthday: 1955/10/12  Age:  67 y.o.  Gender  male    Subjective:  Chief Complaint   Patient presents with    Back Pain     Pain Level:  "Not bad"    Objective:  Palpation:  Hypertonicity in bilateral paraspinals.    Today's Treatment:  Applied Therapeutic Massage, Trigger Point/Neuromuscular Therapy and Myofascial Release to Affected Areas.    Assessment:  Patient tolerated treatment.    Plan:  Return as needed.    Duration:  20 Min.    Primus Bravo, MT

## 2022-10-27 ENCOUNTER — Ambulatory Visit (INDEPENDENT_AMBULATORY_CARE_PROVIDER_SITE_OTHER): Payer: Self-pay | Admitting: Rehabilitative and Restorative Service Providers"

## 2022-10-27 ENCOUNTER — Other Ambulatory Visit: Payer: Self-pay

## 2022-10-27 DIAGNOSIS — M545 Low back pain, unspecified: Secondary | ICD-10-CM

## 2022-10-27 DIAGNOSIS — M549 Dorsalgia, unspecified: Secondary | ICD-10-CM

## 2022-10-27 NOTE — Progress Notes (Signed)
[facility]  [address]  [place], [place] [address]  [phone]    Name:  Henry [name]  MRN:  [id number]  Date:  10/27/2022  Birthday: [birth year]  Age:  67 y.o.  Gender  male    Subjective:  Chief Complaint   Patient presents with    Back Pain     Pain Level:  "Not bad"    Objective:  Palpation:  Hypertonicity in bilateral QL    Today's Treatment:  Applied Therapeutic Massage, Trigger Point/Neuromuscular Therapy and Myofascial Release to Affected Areas.    Assessment:  Patient tolerated treatment.    Plan:  Return as needed.    Duration:  20 Min.    Primus Bravo, MT

## 2022-11-03 ENCOUNTER — Ambulatory Visit (HOSPITAL_BASED_OUTPATIENT_CLINIC_OR_DEPARTMENT_OTHER): Payer: Self-pay | Admitting: Nurse Practitioner

## 2022-11-03 ENCOUNTER — Encounter (INDEPENDENT_AMBULATORY_CARE_PROVIDER_SITE_OTHER): Payer: Self-pay | Admitting: Neurological Surgery

## 2022-11-03 ENCOUNTER — Other Ambulatory Visit: Payer: Self-pay

## 2022-11-03 ENCOUNTER — Ambulatory Visit (INDEPENDENT_AMBULATORY_CARE_PROVIDER_SITE_OTHER): Payer: Self-pay | Admitting: Rehabilitative and Restorative Service Providers"

## 2022-11-03 ENCOUNTER — Ambulatory Visit: Payer: Medicare Other | Admitting: Neurological Surgery

## 2022-11-03 VITALS — Temp 97.5°F | Wt 276.1 lb

## 2022-11-03 DIAGNOSIS — M791 Myalgia, unspecified site: Secondary | ICD-10-CM | POA: Insufficient documentation

## 2022-11-03 DIAGNOSIS — M545 Low back pain, unspecified: Secondary | ICD-10-CM | POA: Insufficient documentation

## 2022-11-03 DIAGNOSIS — M549 Dorsalgia, unspecified: Secondary | ICD-10-CM

## 2022-11-03 NOTE — Nursing Note (Signed)
[place] Pain Rating Scale     On a scale of 0-10, during the past 24 hours, pain has interfered with you usual activity: 5     On a scale of 0-10, during the past 24 hours, pain has interfered with your sleep: 1    On a scale of 0-10, during the past 24 hours, pain has affected your mood: 3     On a scale of 0-10, during the past 24 hours, pain has contributed to your stress: 3     On a scale of 0-10, what is your overall pain Rating: 5

## 2022-11-03 NOTE — Procedures (Signed)
[facility], [facility]  [address]  [facility] [address]  Operated by [facility]  Procedure Note    Name: Henry [name] MRN:  [id number]   Date: 11/03/2022 DOB:  [birth year] (67 y.o.)         Procedures    Marta Antu, MD      Trigger Point Injections    Dx:  myofascial pain    Assisting:  Jonswold    Injectate:  40 mg kenalog and 9ml 0.25% bupi    Muscles:  bilateral lumbar paraspinals    Consent and timeout done.  TPs palpated, marked, and prepped.    Using a 27 gauge needle the solution above was divided between the muscles listed.  Tolerated well.    Jacquelin Hawking, MD

## 2022-11-03 NOTE — Progress Notes (Signed)
[facility]  [address]  [place], [place] [address]  [phone]    Name:  Henry [name]  MRN:  [id number]  Date:  11/03/2022  Birthday: 04/16/1955  Age:  67 y.o.  Gender  male    Subjective:  Chief Complaint   Patient presents with    Back Pain     Pain Level:  Post TPI    Objective:  Palpation:  Hypertonicity in bilateral QL and trapezius.    Today's Treatment:  Applied Therapeutic Massage, Trigger Point/Neuromuscular Therapy and Myofascial Release to Affected Areas.    Assessment:  Patient tolerated treatment.    Plan:  Return as needed.    Duration:  20 Min.    Primus Bravo, MT

## 2022-11-03 NOTE — Patient Instructions (Signed)
PAIN MANAGEMENT, [facility]  [address]  [place] [address]  Dept: [phone]  Dept Fax: [phone]  [phone]                                                 SPECIAL PROCEDURES                                     DISCHARGE FORM                                          [phone]      Please follow the instructions listed below for your procedures.  If you have questions concerning your procedure, you may call and leave a message.  Messages will be returned by the end of the next business day.  If you have an emergency, proceed to your local Emergency Department.      PROCEDURE: TRIGGER POINT INJECTION     Do not drive a car or operate machinery until tomorrow.  Rest today and return to normal activities tomorrow.  If you are on restricted activities by your physician, please continue to follow these.  If you are not sure, contact your physician.  It is possible to experience mild numbness of the lower back and legs.  This is temporary.  If you have soreness at the injection site, the application of heat or ice may be helpful. Mild analgesics may also be used.  In case of severe headache; lie flat to decrease it.  Increase all fluids especially those with caffeine.  Mild analgesics are also appropriate.  If headache is not relieved by these measures, contact the Pain Clinic.  Steroid injections may cause temporary increase of blood sugar levels.    These instructions have been reviewed with the patient and appropriate questions have answers.  Erskine Speed, LPN 2/44/0102 72:53

## 2022-11-10 ENCOUNTER — Other Ambulatory Visit (HOSPITAL_BASED_OUTPATIENT_CLINIC_OR_DEPARTMENT_OTHER): Payer: Self-pay | Admitting: Student in an Organized Health Care Education/Training Program

## 2022-11-10 DIAGNOSIS — E785 Hyperlipidemia, unspecified: Secondary | ICD-10-CM

## 2022-11-12 ENCOUNTER — Other Ambulatory Visit (HOSPITAL_BASED_OUTPATIENT_CLINIC_OR_DEPARTMENT_OTHER): Payer: Self-pay | Admitting: FAMILY MEDICINE

## 2022-11-12 DIAGNOSIS — R0982 Postnasal drip: Secondary | ICD-10-CM

## 2022-11-13 ENCOUNTER — Other Ambulatory Visit (HOSPITAL_BASED_OUTPATIENT_CLINIC_OR_DEPARTMENT_OTHER): Payer: Self-pay | Admitting: Student in an Organized Health Care Education/Training Program

## 2022-11-13 DIAGNOSIS — M1990 Unspecified osteoarthritis, unspecified site: Secondary | ICD-10-CM

## 2022-11-16 ENCOUNTER — Ambulatory Visit: Payer: Medicare Other | Attending: NURSE PRACTITIONER-GERONTOLOGY

## 2022-11-16 ENCOUNTER — Other Ambulatory Visit: Payer: Self-pay

## 2022-11-16 ENCOUNTER — Encounter (INDEPENDENT_AMBULATORY_CARE_PROVIDER_SITE_OTHER): Payer: Self-pay

## 2022-11-16 ENCOUNTER — Ambulatory Visit (INDEPENDENT_AMBULATORY_CARE_PROVIDER_SITE_OTHER): Payer: Self-pay | Admitting: Rehabilitative and Restorative Service Providers"

## 2022-11-16 VITALS — BP 135/81 | HR 70 | Temp 97.6°F | Resp 20 | Ht 67.99 in | Wt 272.9 lb

## 2022-11-16 DIAGNOSIS — M545 Low back pain, unspecified: Secondary | ICD-10-CM

## 2022-11-16 DIAGNOSIS — M549 Dorsalgia, unspecified: Secondary | ICD-10-CM

## 2022-11-16 DIAGNOSIS — M47816 Spondylosis without myelopathy or radiculopathy, lumbar region: Secondary | ICD-10-CM | POA: Insufficient documentation

## 2022-11-16 NOTE — Progress Notes (Signed)
[facility]  [address]  [place], [place] [address]  [phone]    Name:  Henry [name]  MRN:  [id number]  Date:  11/16/2022  Birthday: 01/18/1956  Age:  67 y.o.  Gender  male    Subjective:  Chief Complaint   Patient presents with    Back Pain     Pain Level:  Post TPI    Objective:  Palpation:  Hypertonicity in bilateral paraspinals.    Today's Treatment:  Applied Therapeutic Massage, Trigger Point/Neuromuscular Therapy and Myofascial Release to Affected Areas.    Assessment:  Patient tolerated treatment.    Plan:  Return as needed.    Duration:  20 Min.    Primus Bravo, MT

## 2022-11-16 NOTE — Nursing Note (Signed)
Procedure Follow Up  Henry [name] [facility]  [id number]  Chief Complaint   Patient presents with    Low Back Pain    After Procedure Follow Up       [place] Pain Rating Scale     On a scale of 0-10, during the past 24 hours, pain has interfered with you usual activity: 3     On a scale of 0-10, during the past 24 hours, pain has interfered with your sleep: 0    On a scale of 0-10, during the past 24 hours, pain has affected your mood: 0     On a scale of 0-10, during the past 24 hours, pain has contributed to your stress: 0     On a scale of 0-10, what is your overall pain Rating: 2      Vitals:    11/16/22 1317   BP: 135/81   Pulse: 70   Resp: 20   Temp: 36.4 C (97.6 F)   SpO2: 98%   Weight: 124 kg (272 lb 14.4 oz)   Height: 1.727 m (5' 7.99")   PainSc:   2   PainLoc: Back     Body mass index is 41.5 kg/m.    New imaging:    Patient is here today S/ P TPI that was done on 11/03/22 and reports 75 % relief that is lasting.  Kerman Passey, Ambulatory Care Assistant  11/16/2022, 13:20

## 2022-11-16 NOTE — Progress Notes (Addendum)
[facility]  [address]  [place], [place] [address]  [phone]    Progress Note    Henry [name]  MRN: [id number]  DOB: February 01, 1956  Date of Service: 11/16/2022    CHIEF COMPLAINT  Chief Complaint   Patient presents with    Low Back Pain    After Procedure Follow Up       SUBJECTIVE  Henry [name] is a 67 y.o. male who presents to clinic for procedure follow up. Patient had TPIs performed by Dr. Shawnie Pons on 11/03/22. Patient endorses 75% relief still lasting . Denies any side effects or complication with the procedure. Patient continues to do home exercises and goes to the gym regularly. He continues to do the exercises he learned from the movement therapist as well. He also continues to follow with massage therapy here at the clinic. He denies any red flag signs today.     Previous Update from [name], NP on 09/09/22:  Henry [name] is a 67 y.o. male who presents to clinic for procedure follow up. Patient had bilateral L4-S1 RFA with Dr. Shawnie Pons performed on 08/07/22. Patient endorses 95% relief  still lasting . Denies any side effects or complication with the procedure. Pt reports going to the grocery store and going to the gym are easier post procedure.  Patient reports having pain in paraspinal muscles of the mid-back after prolonged walking. Patient states he's been working out for 2 hours, 3 days a week, and has been walking everyday. Patient reports brief "nerve pain" in left lumbar area after completing "bridge stretch," but states the pain isn't as severe as it once was and subsides quickly. Pt denies any red flag signs today. Overall, patient is very satisfied with relief.     TREATMENT HISTORY:      Helpful Not Helpful Not Tried  Comments   MBB X   Patient had Right L4-S1 LMBB #1 with Dr. Shawnie Pons performed on 03/31/22 and Left L4-S1 LMBB #1 on 04/07/22. Patient endorses 90% relief 1 day(s) for both procedures.    RFA/Cryoablation X   Patient had  bilateral L4-S1 RFA with Dr. Shawnie Pons performed on 08/07/22. Patient endorses 95% relief  still lasting .    Epidural X   07/22/21 L4/5 LESI 50% relief for couple months    Transforaminal/  Nerve root block        SI injection       Intraarticular Joint        Sympathetic Block       Other        TPI X   Patient had TPIs performed by Dr. Shawnie Pons on 11/03/22. Patient endorses 75% relief still lasting.   GTB        SCS       Pain pump         Physical Therapy or HEP        Massage Therapy       Acupuncture        Chiropractor       Exercise Physiologist        Counseling/CBT         Medications:   Acetaminophen 500 mg Oral Capsule, Take 1 Cap (500 mg total) by mouth Every 6 hours as needed  atorvastatin (LIPITOR) 40 mg Oral Tablet, Take 1 Tablet (40 mg total) by mouth Once a day  diclofenac sodium (VOLTAREN) 75 mg Oral Tablet, Delayed Release (E.C.), Take 1 Tablet (75 mg  total) by mouth Twice daily  dicyclomine (BENTYL) 20 mg Oral Tablet, Take 1 Tablet (20 mg total) by mouth Four times a day  fluticasone propionate (FLONASE) 50 mcg/actuation Nasal Spray, Suspension, Administer 2 Sprays into each nostril Once a day  GLUCOS CHOND CPLX ADVANCED ORAL, Take 2 Tabs by mouth Once a day Glucosamine-Chondroitin 1500 mg  hydroCHLOROthiazide (HYDRODIURIL) 25 mg Oral Tablet, Take 1 Tablet (25 mg total) by mouth Once a day  losartan (COZAAR) 100 mg Oral Tablet, Take 1 Tablet (100 mg total) by mouth Once a day  metoprolol tartrate (LOPRESSOR) 50 mg Oral Tablet, Take 1 Tablet (50 mg total) by mouth Twice daily  metroNIDAZOLE 1 % Gel with Pump, by Apply externally route Once a day  multivitamin Oral Tablet, Take 1 Tablet by mouth Once a day  Non-Formulary/Special Preparation (MEDICATION HELP), CBD 2 capsules per day   Omeprazole 20 mg Oral Tablet, Delayed Release (E.C.), Take by mouth Once a day am  triamcinolone acetonide 0.1 % Cream,     No facility-administered medications prior to visit.      Allergies:   Allergies   Allergen Reactions     Sulfa (Sulfonamides) Rash    Shellfish Containing Products Itching and Swelling     Shrimp - eye swelling, itching       Review of Systems:   CONSTITUTIONAL: Denies weight change. Denies fever.   GI: Denies bowel incontinence.  GU: Denies urinary incontinence or retention.  NEUROLOGICAL: Denies paralysis. Denies tremors    PSYCHIATRIC: Denies acute changes in mood.  MUSCULOSKELETAL: See HPI.    OBJECTIVE  BP 135/81   Pulse 70   Temp 36.4 C (97.6 F)   Resp 20   Ht 1.727 m (5' 7.99")   Wt 124 kg (272 lb 14.4 oz)   SpO2 98%   BMI 41.50 kg/m     General: no distress   Abdomen: soft, non-tender and non-distended   Respiratory: No increased work of breathing. No increased accessory muscle use   Cardiovascular: acyanotic, no JVD  Skin: warm and dry, no rash  Neurologic: gait is antalgic , motor 5/5 and sensory intact in b/l UEs, AOx3, CN 2-12 grossly intact, negative Hoffman's b/l  Deep Tendon Reflexes    Brachioradialis Bicep Patellar Achilles   Right  2+ 2+ 2+ 2+   Left 2+ 2+ 2+ 2+     Psychiatric: normal affect and behavior  Musculoskeletal: no cyanosis or edema  Lumbar   ROM: Preserved   Palpation: Non-tender   Motor: 5/5 in LEs b/l   Sensory: Intact in LEs b/l                   Nursing Notes:   Kerman Passey, Ambulatory Care Assistant  11/16/22 1321  Signed  Procedure Follow Up  Zavian Cheers [facility]  [id number]  Chief Complaint   Patient presents with    Low Back Pain    After Procedure Follow Up       [place] Pain Rating Scale     On a scale of 0-10, during the past 24 hours, pain has interfered with you usual activity: 3     On a scale of 0-10, during the past 24 hours, pain has interfered with your sleep: 0    On a scale of 0-10, during the past 24 hours, pain has affected your mood: 0     On a scale of 0-10, during the past 24 hours, pain has contributed to your stress: 0  On a scale of 0-10, what is your overall pain Rating: 2      Vitals:    11/16/22 1317   BP: 135/81   Pulse: 70   Resp: 20   Temp: 36.4 C  (97.6 F)   SpO2: 98%   Weight: 124 kg (272 lb 14.4 oz)   Height: 1.727 m (5' 7.99")   PainSc:   2   PainLoc: Back     Body mass index is 41.5 kg/m.    New imaging:    Patient is here today S/ P TPI that was done on 11/03/22 and reports 75 % relief that is lasting.  Kerman Passey, Ambulatory Care Assistant  11/16/2022, 13:20      Imaging: Reviewed    Recent Results (from the past [id number] hour(s))   MRI SPINE LUMBOSACRAL WO CONTRAST    Collection Time: 06/03/20 10:27 AM    Narrative    CLINICAL INFORMATION:  Rowdy MICHAEL Beranek  Male, 67 years old.    MRI SPINE LUMBOSACRAL WO CONTRAST performed on 06/03/2020 10:27 AM.    REASON FOR EXAM:  M54.16: Lumbar radiculopathy    COMPARISON: Radiographs of the lumbar spine 05/05/2020.    TECHNIQUE: MRI of the lumbosacral spine was performed  without contrast. 3 plane localizer, sagittal T2, sagittal T1, coronal T2, coronal STIR, axial T2 and axial T1 sequences were obtained.    FINDINGS:   Straightening of the lumbar lordosis. Grade 1 anterolisthesis of L4 on L5. Vertebral body heights are normal. Marrow signal is heterogeneous without evidence of an aggressive marrow replacing process. Mild disc height loss is noted at L4-L5.    The conus ends at L1-L2. No clumping or thickening of the cauda equina.     Level by level evaluation is as follows:    T12-L1: No spinal canal or neural foraminal stenosis. There is mild disc bulging and bilateral facet hypertrophy.  L1-L2: No spinal canal or neural foraminal stenosis. Mild disc bulging and bilateral facet hypertrophy.  L2-L3: Disc bulging and moderate ligamentum flavum thickening result in mild spinal canal stenosis. Moderate bilateral facet hypertrophy results in mild bilateral neural foraminal stenosis.    L3-L4: Disc bulging and mild facet hypertrophy result in mild spinal canal stenosis. Posterior endplate osteophytes and moderate facet hypertrophy result in mild bilateral neural foraminal stenosis.    L4-L5: Diffuse disc bulging  and advanced ligamentum flavum thickening results in severe spinal canal stenosis. There is effacement of the bilateral subarticular recesses with contact of the traversing bilateral L5 nerve roots. Advanced bilateral facet hypertrophy results in moderate bilateral neural foraminal stenosis. Far lateral disc bulging results in contact of the exiting bilateral L4 nerve roots.    L5-S1: Diffuse disc bulging and mild ligamentum flavum thickening do not result in spinal canal stenosis. Posterior endplate osteophytes and moderate facet hypertrophy results in moderate bilateral neural foraminal stenosis. Far lateral disc bulging results in contact of the exiting right L5 nerve root.      Prevertebral soft tissues are normal.        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 There is associated effacement of the subarticular recesses with contact of the traversing bilateral L5 nerve roots.  2.Up to moderate neural foraminal stenosis of the bilateral L4-L5 and L5-S1 levels.  3.Far lateral disc bulging contacts the exiting bilateral L4 and exiting right L5 nerve roots.  ASSESSMENT    ICD-10-CM    1. Low back pain, unspecified back pain laterality, unspecified chronicity, unspecified whether sciatica present  M54.50 MRI SPINE LUMBOSACRAL WO CONTRAST     RHIZOTOMY LUMBAR/SACRAL W FLUORO 2 LEVEL; 64635, [phone] Prior Auth/Scheduling Procedure     PAIN CL FL RHIZOTOMY LUMBAR SACRAL 2 LEVELS     DESTRUCTION BY NEUROLYTIC AGENT, PARAVERTEVRAL FACET JOINT NERVE, W IMAGING GUIDANCE-LUMBAR OR SACRAL; UP TO 5 FACET JOINTS (AMB ONLY)      2. Lumbar spondylosis  M47.816 MRI SPINE LUMBOSACRAL WO CONTRAST     RHIZOTOMY LUMBAR/SACRAL W FLUORO 2 LEVEL; 64635, [phone] Prior Auth/Scheduling Procedure     PAIN CL FL RHIZOTOMY LUMBAR SACRAL 2 LEVELS     DESTRUCTION BY NEUROLYTIC AGENT, PARAVERTEVRAL FACET JOINT NERVE, W IMAGING GUIDANCE-LUMBAR OR SACRAL; UP TO 5 FACET  JOINTS (AMB ONLY)            PLAN/RECOMMENDATION  Orders Placed This Encounter    DESTRUCTION BY NEUROLYTIC AGENT, PARAVERTEVRAL FACET JOINT NERVE, W IMAGING GUIDANCE-LUMBAR OR SACRAL; UP TO 5 FACET JOINTS (AMB ONLY)    MRI SPINE LUMBOSACRAL WO CONTRAST    PAIN CL FL RHIZOTOMY LUMBAR SACRAL 2 LEVELS    RHIZOTOMY LUMBAR/SACRAL W FLUORO 2 LEVEL; 01093, [id number] Prior Auth/Scheduling Procedure       1. MRI Lumbar Spine ordered today. Patient has anxiety, so ordered with anesthesia. Patient continues to do home exercises and goes to the gym regularly. He continues to do the exercises he learned from the movement therapist as well.  2. Bilateral L4-S1 RFA ordered. Patient to obtained this after 02/06/23. Patient verbalized understanding that if he has any new symptoms he needs to be re-evaluated prior to procedure.    Not on AC. No implantable devices.   Patient had 80% relief for past six months with previous L4-S1 RFA.   3. Follow up after imagining and/or RFA, sooner if needed.  4. Patient informed of red flags symptoms like caudal anesthesia, bowel, bladder incontinence including the inability to urinate, weakness of legs that they need to go to the ED emergently for possible impingement of the spinal nerve roots and may need urgent decompression.  Patient confirmed understanding.     Our impression, treatment recommendations and plan from today's visit were reviewed in detail with the patient in the office. All of the patient's questions were answered. If a procedure was ordered, it was explained using a spine model,  including technique, benefits, alternatives and the associated risks.   The patient verbalized understanding of the above plan and the patient wishes to move forward with the above noted plan.    Birdie Riddle, APRN,AGPCNP-BC 11/16/2022, 13:51    I personally examined and assessed the patient. I agree with the APP's or student's findings and treatment plan. Any changes were made at the time of signing.      [name], FNP-C 11/16/2022, 14:57

## 2022-11-23 ENCOUNTER — Encounter (HOSPITAL_COMMUNITY): Payer: Self-pay

## 2022-11-23 NOTE — Nurses Notes (Signed)
Do you take more than 2 medications for your blood pressure?   [x] Yes [] No  Do you have COPD?   [x] Yes [] No  Do you have sleep apnea?   [x] Yes [] No  Any bleeding disorders?   [] Yes [] No  Do you have cirrhosis of the liver?   [] Yes [] No  Do you get short of breath if you walk up 10 stairs?   [] Yes [] No  Are you on dialysis?   [] Yes [] No   Do you take Insulin for Diabetes?   [] Yes [] No   Do you have a pacemaker or defibrillator?   [] Yes [] No   Do you have pulmonary hypertension?   [] Yes [] No   Do you weight over 300 pounds?   [] Yes [] No   Do you have chest pain?   [] Yes [] No   Do you have stents in your heart or have had bypass surgery?   [] Yes [] No   Have you ever had a heart attack or told you have heart failure?   [] Yes [] No   Have you ever had a stroke?   [] Yes [] No   Do you routinely follow up with a cardiologist?   [] Yes [] No   Have you ever been told it was difficult to place a breathing tube in your airway (Difficult Intubation)?   [] Yes [] No   In the past 3 months, have you had increased shortness of breath, chest pain, dizziness, fatigue or palpitations?   [] Yes [] No   Do you get short of breath if you lay flat, or use just one pillow for 30 minutes?   [] Yes [] No   Do you ever use oxygen at home?   [] Yes [] No   Do you use an inhaler almost every day to help you breathe?   [] Yes [] No   Are you under the regular care of a hematologist?   [] Yes [] No   Do you take a prescription blood thinner?   [] Yes [] No   Have you ever had a kidney, liver, or heart transplant?   [] Yes [] No   Are you currently wearing a life vest?   [] Yes [] No   If patient answers yes to any of these questions they should be evaluated in the [facility] unless they have had surgery with the same anesthesia within the last 30 days and report no change in conditions.   Recommending PEC based on these questions   [x] Yes [] No

## 2022-11-24 ENCOUNTER — Other Ambulatory Visit: Payer: Self-pay

## 2022-11-24 ENCOUNTER — Ambulatory Visit: Payer: Medicare Other | Attending: Family Medicine

## 2022-11-24 ENCOUNTER — Ambulatory Visit (INDEPENDENT_AMBULATORY_CARE_PROVIDER_SITE_OTHER): Payer: Self-pay | Admitting: Rehabilitative and Restorative Service Providers"

## 2022-11-24 DIAGNOSIS — M545 Low back pain, unspecified: Secondary | ICD-10-CM

## 2022-11-24 DIAGNOSIS — Z23 Encounter for immunization: Secondary | ICD-10-CM | POA: Insufficient documentation

## 2022-11-24 NOTE — Patient Instructions (Signed)
Vaccine Information Statement    Influenza (Flu) Vaccine (Inactivated or Recombinant): What You Need to Know    Many vaccine information statements are available in Spanish and other languages. See www.immunize.org/vis.  Hojas de informacin sobre vacunas estn disponibles en espaol y en muchos otros idiomas. Visite www.immunize.org/vis.    1. Why get vaccinated?    Influenza vaccine can prevent influenza (flu).    Flu is a contagious disease that spreads around the [place] every year, usually between October and May. Anyone can get the flu, but it is more dangerous for some people. Infants and young children, people 65 years and older, pregnant people, and people with certain health conditions or a weakened immune system are at greatest risk of flu complications.    Pneumonia, bronchitis, sinus infections, and ear infections are examples of flu-related complications. If you have a medical condition, such as heart disease, cancer, or diabetes, flu can make it worse.    Flu can cause fever and chills, sore throat, muscle aches, fatigue, cough, headache, and runny or stuffy nose. Some people may have vomiting and diarrhea, though this is more common in children than adults.     In an average year, thousands of people in the [place] die from flu, and many more are hospitalized. Flu vaccine prevents millions of illnesses and flu-related visits to the doctor each year.    2. Influenza vaccines     CDC recommends everyone 6 months and older get vaccinated every flu season. Children 6 months through 8 years of age may need 2 doses during a single flu season. Everyone else needs only 1 dose each flu season.    It takes about 2 weeks for protection to develop after vaccination.    There are many flu viruses, and they are always changing. Each year a new flu vaccine is made to protect against the influenza viruses believed to be likely to cause disease in the upcoming flu season. Even when the vaccine doesn't  exactly match these viruses, it may still provide some protection.     Influenza vaccine does not cause flu.    Influenza vaccine may be given at the same time as other vaccines.    3. Talk with your health care provider    Tell your vaccination provider if the person getting the vaccine:  . Has had an allergic reaction after a previous dose of influenza vaccine, or has any severe, life-threatening allergies   . Has ever had Guillain-Barr Syndrome (also called "GBS")    In some cases, your health care provider may decide to postpone influenza vaccination until a future visit.    Influenza vaccine can be administered at any time during pregnancy. People who are or will be pregnant during influenza season should receive inactivated influenza vaccine.    People with minor illnesses, such as a cold, may be vaccinated. People who are moderately or severely ill should usually wait until they recover before getting influenza vaccine.    Your health care provider can give you more information.    4. Risks of a vaccine reaction    . Soreness, redness, and swelling where the shot is given, fever, muscle aches, and headache can happen after influenza vaccination.  . There may be a very small increased risk of Guillain-Barr Syndrome (GBS) after inactivated influenza vaccine (the flu shot).    Young children who get the flu shot along with pneumococcal vaccine (PCV13) and/or DTaP vaccine at the same time might be   slightly more likely to have a seizure caused by fever. Tell your health care provider if a child who is getting flu vaccine has ever had a seizure.    People sometimes faint after medical procedures, including vaccination. Tell your provider if you feel dizzy or have vision changes or ringing in the ears.    As with any medicine, there is a very remote chance of a vaccine causing a severe allergic reaction, other serious injury, or death.    5. What if there is a serious problem?    An allergic reaction could occur  after the vaccinated person leaves the clinic. If you see signs of a severe allergic reaction (hives, swelling of the face and throat, difficulty breathing, a fast heartbeat, dizziness, or weakness), call 9-1-1 and get the person to the nearest hospital.    For other signs that concern you, call your health care provider.    Adverse reactions should be reported to the Vaccine Adverse Event Reporting System (VAERS). Your health care provider will usually file this report, or you can do it yourself. Visit the VAERS website at www.vaers.hhs.gov or call 1-800-822-7967. VAERS is only for reporting reactions, and VAERS staff members do not give medical advice.    6. The National Vaccine Injury Compensation Program    The National Vaccine Injury Compensation Program (VICP) is a federal program that was created to compensate people who may have been injured by certain vaccines. Claims regarding alleged injury or death due to vaccination have a time limit for filing, which may be as short as two years. Visit the VICP website at www.hrsa.gov/vaccinecompensation or call 1-800-338-2382 to learn about the program and about filing a claim.     7. How can I learn more?    . Ask your health care provider.   . Call your local or state health department.   . Visit the website of the Food and Drug Administration (FDA) for vaccine package inserts and additional information at www.fda.gov/vaccines-blood-biologics/vaccines.  . Contact the Centers for Disease Control and Prevention (CDC):  - Call 1-800-232-4636 (1-800-CDC-INFO) or  - Visit CDC's influenza website at www.cdc.gov/flu.    Vaccine Information Statement   Inactivated Influenza Vaccine   09/22/2019  42 U.S.C.  [id number]   Department of Health and Human Services  Centers for Disease Control and Prevention    Office Use Only

## 2022-11-24 NOTE — Progress Notes (Signed)
[facility]  [address]  [place], [place] [address]  [phone]    Name:  Henry [name]  MRN:  [id number]  Date:  11/24/2022  Birthday: 02-17-1956  Age:  67 y.o.  Gender  male    Subjective:  Chief Complaint   Patient presents with    Low Back Pain     Pain Level:  "Not bad"    Objective:  Palpation:  Hypertonicity in bilateral paraspinals.    Today's Treatment:  Applied Therapeutic Massage, Trigger Point/Neuromuscular Therapy and Myofascial Release to Affected Areas.    Assessment:  Patient tolerated treatment.    Plan:  Return as needed.    Duration:  20 Min.    Primus Bravo, MT

## 2022-11-24 NOTE — Nursing Note (Signed)
1. Are you 67 years of age or older? no  2. Have you ever had a severe reaction to a flu shot? no  3. Are you allergic to eggs? no  4. Are you allergic to latex? no  5. Are you allergic to Thimerosol? no  6. Are you experiencing acute illness symptoms or have you been running a fever? no  7. Do you have a medical condition or taking medications that suppress your immune system? no  8. Have you ever had Guillain-Barre syndrome or other neurologic disorder? no  9. Are you pregnant or breastfeeding? no    Immunization administered       Name Date Dose VIS Date Route    Influenza Vaccine, 65+ 11/24/2022 0.5 mL 09/22/2019 Intramuscular    Site: Left deltoid    Given By: Stanford Breed, LPN    Manufacturer: [employer]    Lot: [id number]    NDC: [id number]            Stanford Breed, LPN 47/09/2954, 21:30

## 2022-12-08 ENCOUNTER — Ambulatory Visit (INDEPENDENT_AMBULATORY_CARE_PROVIDER_SITE_OTHER): Payer: Self-pay | Admitting: Rehabilitative and Restorative Service Providers"

## 2022-12-08 ENCOUNTER — Other Ambulatory Visit: Payer: Self-pay

## 2022-12-08 DIAGNOSIS — M549 Dorsalgia, unspecified: Secondary | ICD-10-CM

## 2022-12-08 DIAGNOSIS — M545 Low back pain, unspecified: Secondary | ICD-10-CM

## 2022-12-08 NOTE — Progress Notes (Signed)
[facility]  [address]  [place], [place] [address]  [phone]    Name:  Henry [name]  MRN:  [id number]  Date:  12/08/2022  Birthday: 17-Nov-1955  Age:  67 y.o.  Gender  male    Subjective:  Chief Complaint   Patient presents with    Back Pain     Pain Level:  "Tight"    Objective:  Palpation:  Hypertonicity in bilateral paraspinals.    Today's Treatment:  Applied Therapeutic Massage, Trigger Point/Neuromuscular Therapy and Myofascial Release to Affected Areas.    Assessment:  Patient tolerated treatment.    Plan:  Return as needed.    Duration:  20 Min.    Primus Bravo, MT

## 2022-12-15 ENCOUNTER — Other Ambulatory Visit: Payer: Self-pay

## 2022-12-15 ENCOUNTER — Ambulatory Visit (INDEPENDENT_AMBULATORY_CARE_PROVIDER_SITE_OTHER): Payer: Self-pay | Admitting: Rehabilitative and Restorative Service Providers"

## 2022-12-15 DIAGNOSIS — M545 Low back pain, unspecified: Secondary | ICD-10-CM

## 2022-12-15 NOTE — Progress Notes (Signed)
[facility]  [address]  [place], [place] [address]  [phone]    Name:  Henry [name]  MRN:  [id number]  Date:  12/15/2022  Birthday: [birth year]  Age:  67 y.o.  Gender  male    Subjective:  Chief Complaint   Patient presents with    Back Pain     Pain Level:  "Not bad"    Objective:  Palpation:  Hypertonicity in bilateral paraspinals.    Today's Treatment:  Applied Therapeutic Massage, Trigger Point/Neuromuscular Therapy and Myofascial Release to Affected Areas.    Assessment:  Patient tolerated treatment.    Plan:  Return as needed.    Duration:  20 Min.    Primus Bravo, MT

## 2022-12-29 ENCOUNTER — Other Ambulatory Visit: Payer: Self-pay

## 2022-12-29 ENCOUNTER — Ambulatory Visit (INDEPENDENT_AMBULATORY_CARE_PROVIDER_SITE_OTHER): Payer: Self-pay | Admitting: Rehabilitative and Restorative Service Providers"

## 2022-12-29 DIAGNOSIS — M545 Low back pain, unspecified: Secondary | ICD-10-CM

## 2022-12-29 NOTE — Progress Notes (Signed)
[facility]  [address]  [place], [place] [address]  [phone]    Name:  Henry [name]  MRN:  [id number]  Date:  12/29/2022  Birthday: 12-17-1955  Age:  67 y.o.  Gender  male    Subjective:  Chief Complaint   Patient presents with    Low Back Pain     Pain Level:  "Not bad"    Objective:  Palpation:  Hypertonicity in bilateral paraspinals.    Today's Treatment:  Applied Therapeutic Massage, Trigger Point/Neuromuscular Therapy and Myofascial Release to Affected Areas.    Assessment:  Patient tolerated treatment.    Plan:  Return as needed.    Duration:  20 Min.    Primus Bravo, MT

## 2023-01-05 ENCOUNTER — Ambulatory Visit (INDEPENDENT_AMBULATORY_CARE_PROVIDER_SITE_OTHER): Payer: Self-pay | Admitting: Rehabilitative and Restorative Service Providers"

## 2023-01-05 ENCOUNTER — Other Ambulatory Visit: Payer: Self-pay

## 2023-01-05 DIAGNOSIS — M545 Low back pain, unspecified: Secondary | ICD-10-CM

## 2023-01-05 NOTE — Progress Notes (Signed)
[facility]  [address]  [place], [place] [address]  [phone]    Name:  Henry [name]  MRN:  [id number]  Date:  01/05/2023  Birthday: Nov 04, 1955  Age:  67 y.o.  Gender  male    Subjective:  Chief Complaint   Patient presents with    Low Back Pain     Pain Level:  "Sore"    Objective:  Palpation:  Hypertonicity in bilateral paraspinals.    Today's Treatment:  Applied Therapeutic Massage, Trigger Point/Neuromuscular Therapy and Myofascial Release to Affected Areas.    Assessment:  Patient tolerated treatment.    Plan:  Return as needed.    Duration:  20 Min.    Primus Bravo, MT

## 2023-01-07 ENCOUNTER — Other Ambulatory Visit (HOSPITAL_BASED_OUTPATIENT_CLINIC_OR_DEPARTMENT_OTHER): Payer: Self-pay | Admitting: FAMILY MEDICINE

## 2023-01-07 DIAGNOSIS — R0982 Postnasal drip: Secondary | ICD-10-CM

## 2023-01-19 ENCOUNTER — Ambulatory Visit (INDEPENDENT_AMBULATORY_CARE_PROVIDER_SITE_OTHER): Payer: Self-pay | Admitting: Rehabilitative and Restorative Service Providers"

## 2023-01-19 ENCOUNTER — Other Ambulatory Visit: Payer: Self-pay

## 2023-01-19 DIAGNOSIS — M545 Low back pain, unspecified: Secondary | ICD-10-CM

## 2023-01-19 NOTE — Progress Notes (Signed)
[facility]  [address]  [place], [place] [address]  [phone]    Name:  Henry [name]  MRN:  [id number]  Date:  01/19/2023  Birthday: 1955-11-19  Age:  67 y.o.  Gender  male    Subjective:  Chief Complaint   Patient presents with    Back Pain     Pain Level:  "Not bad"    Objective:  Palpation:  Hypertonicity in bilateral paraspinals.    Today's Treatment:  Applied Therapeutic Massage, Trigger Point/Neuromuscular Therapy and Myofascial Release to Affected Areas.    Assessment:  Patient tolerated treatment.    Plan:  Return as needed.    Duration:  20 Min.    Primus Bravo, MT

## 2023-01-26 ENCOUNTER — Ambulatory Visit (INDEPENDENT_AMBULATORY_CARE_PROVIDER_SITE_OTHER): Payer: Self-pay | Admitting: Rehabilitative and Restorative Service Providers"

## 2023-02-04 ENCOUNTER — Ambulatory Visit (HOSPITAL_BASED_OUTPATIENT_CLINIC_OR_DEPARTMENT_OTHER): Payer: Self-pay | Admitting: Student in an Organized Health Care Education/Training Program

## 2023-02-10 ENCOUNTER — Other Ambulatory Visit (HOSPITAL_BASED_OUTPATIENT_CLINIC_OR_DEPARTMENT_OTHER): Payer: Self-pay | Admitting: Student in an Organized Health Care Education/Training Program

## 2023-02-10 DIAGNOSIS — M1990 Unspecified osteoarthritis, unspecified site: Secondary | ICD-10-CM

## 2023-02-10 DIAGNOSIS — K589 Irritable bowel syndrome without diarrhea: Secondary | ICD-10-CM

## 2023-02-11 ENCOUNTER — Telehealth (INDEPENDENT_AMBULATORY_CARE_PROVIDER_SITE_OTHER): Payer: Self-pay | Admitting: Neurological Surgery

## 2023-02-11 NOTE — Telephone Encounter (Signed)
CM contacted PT and went over Non Fasting guidelines for upcoming procedure on 01.07.24 with Dr Shawnie Pons. PT states they are not currently taking antibiotics. PT had no further questions at this time Clinic phone number was provided to PT at this time.    02/11/2023  Arminda Resides, CASE MANAGER

## 2023-02-12 ENCOUNTER — Other Ambulatory Visit (HOSPITAL_BASED_OUTPATIENT_CLINIC_OR_DEPARTMENT_OTHER): Payer: Self-pay | Admitting: Student in an Organized Health Care Education/Training Program

## 2023-02-12 DIAGNOSIS — R0982 Postnasal drip: Secondary | ICD-10-CM

## 2023-02-12 MED ORDER — FLUTICASONE PROPIONATE 50 MCG/ACTUATION NASAL SPRAY,SUSPENSION
2.0000 | Freq: Every day | NASAL | 3 refills | Status: AC
Start: 2023-02-12 — End: ?

## 2023-02-12 NOTE — Nursing Note (Signed)
90 day supply requested  Eliora Nienhuis, MA

## 2023-02-23 ENCOUNTER — Other Ambulatory Visit: Payer: Self-pay

## 2023-02-23 ENCOUNTER — Encounter (INDEPENDENT_AMBULATORY_CARE_PROVIDER_SITE_OTHER): Payer: Self-pay | Admitting: Neurological Surgery

## 2023-02-23 ENCOUNTER — Telehealth (HOSPITAL_COMMUNITY): Payer: Self-pay

## 2023-02-23 ENCOUNTER — Ambulatory Visit: Payer: Medicare Other | Attending: NURSE PRACTITIONER-GERONTOLOGY | Admitting: Neurological Surgery

## 2023-02-23 DIAGNOSIS — M47816 Spondylosis without myelopathy or radiculopathy, lumbar region: Secondary | ICD-10-CM | POA: Insufficient documentation

## 2023-02-23 DIAGNOSIS — M545 Low back pain, unspecified: Secondary | ICD-10-CM | POA: Insufficient documentation

## 2023-02-23 NOTE — Procedures (Signed)
PAIN MANAGEMENT, [facility]  [address]  [facility] [place] [address]  Operated by [facility]  Procedure Note    Name: Olanda Carufel MRN:  [id number]   Date: 02/23/2023 DOB:  1955/04/22 (68 y.o.)         DESTRUCTION BY NEUROLYTIC AGENT, PARAVERTEVRAL FACET JOINT NERVE, W IMAGING GUIDANCE-LUMBAR OR SACRAL; UP TO 5 FACET JOINTS (AMB ONLY)    Performed by: Marta Antu, MD  Authorized by: Birdie Riddle, APRN,AGPCNP-BC    List number of facet joints::  4      Marta Antu, MD      Lumbar Medial Branch Ablation with Fluoro    Pre Procedure Dx:  lumbar spondylosis    Post Procedure Dx:  lumbar spondylosis    Guidance:  Fluoroscopy    Assisting:  none    Side:  bilateral    Level:  L4/5/s1    Skin:  6ml 1% lido    Prelesion:  6ml 2% lido, 2ml .25% bupi, and 40mg  kenalog    Postlesion:  none    Consent and timeout done.  Skin prepped and draped in routine fashion.  Sterile technique maintained throughout.    In 10 degrees ipsalateral oblique with caudal to cephalic tilt the junction of SAP and transverse processes at:   bilateral L4/5 as well as the sacral ala were identified using the fluoroscope.  Skin overlying was anesthetized.  Neurotherm needles with 1cm active tips advanced until periosteum contacted.  In an AP view each needle was slid into position.  Sensory and motor stimulation performed.  Prelesion anesthetic administered and we waiting 2 minutes then each site was lesioned at 80 degrees for 90 seconds.  Next the post lesion anesthetic was given and the needles were withdrawn.  Patient tolerated the procedure well.    Jacquelin Hawking, MD

## 2023-02-23 NOTE — Patient Instructions (Signed)
PAIN MANAGEMENT, [facility]  [address]  [place] [address]  Dept: [phone]  Dept Fax: [phone]  [phone]                                                 SPECIAL PROCEDURES                                     DISCHARGE FORM                                          [phone]      Please follow the instructions listed below for your procedures.  If you have questions concerning your procedure, you may call and leave a message.  Messages will be returned by the end of the next business day.  If you have an emergency, proceed to your local Emergency Department.      PROCEDURE: BILATERAL LUMBAR RADIOFREQUENCY ABLATION    Do not drive a car or operate machinery until tomorrow.  Rest today and return to normal activities tomorrow.  If you are on restricted activities by your physician, please continue to follow these.  If you are not sure, contact your physician.  It is possible to experience mild numbness of the lower back and legs.  This is temporary.  If you have soreness at the injection site, the application of heat or ice may be helpful. Mild analgesics may also be used.  In case of severe headache; lie flat to decrease it.  Increase all fluids especially those with caffeine.  Mild analgesics are also appropriate.  If headache is not relieved by these measures, contact the Pain Clinic.  Steroid injections may cause temporary increase of blood sugar levels.    These instructions have been reviewed with the patient and appropriate questions have answers.  Rogers Blocker, RT 02/23/2023 09:45

## 2023-02-23 NOTE — Nursing Note (Signed)
MRI Anesthesia Patient Instructions        Henry [name], Henry [name], 68 y.o. male  Date of Birth:  [birth year]      Called and spoke to patient/ family member regarding MRI with anesthesia. Instructions given as follows:          Allergies   Allergen Reactions    Sulfa (Sulfonamides) Rash    Shellfish Containing Products Itching and Swelling     Shrimp - eye swelling, itching       Prior to Admission Meds:   Cannot display prior to admission medications because the patient has not been admitted in this contact.            Known Implants:  [x] Yes  [] No       Comments: B/L hips  Fever, cough, cold symptoms: [] Yes [] No  Comments:           Anesthesia notified? [] Yes  [] No   Hx of anesthesia complications: [] Yes [] No   Comments:   Contrast Allergy:    [] Yes   [] No  if yes, then explain:    Premedication ordered:    [] Yes   [] No         Instructions given:      [] Yes   [] No     MRI scheduled for :  Date:  02/25/23  Time: 1000  PEC Appointment Date:     The patient will be given general anesthesia, consent will need to be obtained prior from patient/legal guardian. Please being any relevant paperwork / identification.     The patient must have a driver and someone to stay with them 24 hours after recovery available with them on arrival and at the time of discharge.      Please arrive 30-60 minutes prior to your appointment time and check in at the [employer] of MRI in the Main Lobby of [facility].     No zippers, metal buttons, snaps, glitter on clothing, make-up or in hair.     Nothing by mouth 8 hours prior to your arrival. Date: 02/25/23 Time: 0200    You may have clear liquids (apple juice, water, Gatorade - light colored) up until 2 hours prior to your arrival. Date:    02/25/23 Time: 0800    Morning medications may be taken with small sip of water.    Medication(s) to hold: HCTZ, cozaar  Directions to MRI given?  [] Yes [] NO      Deanne Coffer, RN  02/23/2023, 09:46

## 2023-02-23 NOTE — Nursing Note (Signed)
[place] Pain Rating Scale     On a scale of 0-10, during the past 24 hours, pain has interfered with you usual activity: 7     On a scale of 0-10, during the past 24 hours, pain has interfered with your sleep: 5    On a scale of 0-10, during the past 24 hours, pain has affected your mood: 3     On a scale of 0-10, during the past 24 hours, pain has contributed to your stress: 3     On a scale of 0-10, what is your overall pain Rating: 7

## 2023-02-24 ENCOUNTER — Ambulatory Visit: Payer: Medicare Other | Attending: Nurse Practitioner | Admitting: Nurse Practitioner

## 2023-02-24 VITALS — BP 153/76 | HR 70 | Temp 98.9°F | Resp 18 | Ht 69.0 in | Wt 281.7 lb

## 2023-02-24 DIAGNOSIS — G4733 Obstructive sleep apnea (adult) (pediatric): Secondary | ICD-10-CM | POA: Insufficient documentation

## 2023-02-24 NOTE — Progress Notes (Signed)
[facility]  [facility]    Patient Name: Henry [name]  MRN#: [id number]  DOB: 1956/02/01  Date of Service: 02/24/2023    CC: Sleep Apnea        Last Clinic Visit: 11/04/2021      Sleep Disorders Current Therapy  Positive Pressure: CPAP 9 cm H2O     Mask: Used to use the zest nasal- not sure which nasal mask he has currently      HPI: The patient 68 y.o. with obstructive sleep apnea presenting for routine follow up. He states he uses CPAP nightly and perceives good benefit from use. He likes mask and pressure is comfortable. He is retired. He has no concerns or complaints to mention today.       Apnea Hypopnea index (diagnostic): ANP 01/23/2010 AHI 53.8 Desat 63%    -Titration study 03/03/2010 CPAP 7 cm H2O      Problems with Therapy:    Mask: N/A    Machine: Denies    Nasal: Denies    Epworth Sleepiness Scale: 5        Sleep structure:   - bedtime: 0000, sleep latency: minutes, Frequent night time awakening: minimal, Up: 0500  -Nap: sometimes a short afternoon nap for up to an hour  -Caffeine: Minimal to none        Review of Systems:   Constitutional: (-) fever (-) chills (-) fatigue (-) unintentional weight loss  Eyes: (-) vision changes (-) itchy or burning eyes  Ears/Nose/Thoat/Mouth: (-) snoring  (-) oral lesions  (-) sore throat (-) nasal congestion  CV: (-) chest pain (-) palpitations (-) edema   Pulm: (-) shortness of breath (-) cough (-) nocturnal gasping  (-) dyspnea on exertion  GI: (-) abl pain (-) nausea (-) vomiting (-) diarrhea (-) constipation  Genitourinary: (-) dysuria (-) nocturia (-) incontinence (-) urgency  Musculoskeletal: (-) joint pain (-) joint swelling (-) muscle pain  Neurologic: (-) memory loss (-) paresthesias (-) dizziness (-) headaches  Skin: (-) rash (-) itching (-) jaundice  Psych: (-) anxiety (-) depression (-) insomnia   Endocrine: (-) heat intolerance (-) cold intolerance  Heme/Lymph: (-) lymphadenopathy (-) easy bruising  Allergy/Immune: (-) hives  (-) seasonal allergies       Past Medical History:   Past Medical History:   Diagnosis Date    Arthritis     Back problem     Cancer (CMS HCC) 11/2015    prostate cancer, melanoma back neck    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)     Chronic pain     CPAP (continuous positive airway pressure) dependence     CPAP 9 cm H2O    CPAP (continuous positive airway pressure) dependence     Deep vein thrombosis (DVT) (CMS HCC)     left leg    Eczema     GERD (gastroesophageal reflux disease)     well controlled on omeprazole    Heartburn     HTN     Hyperlipidemia     Hypertension     Irritable bowel syndrome     Melanoma (CMS HCC)     Morbid obesity with BMI of 40.0-44.9, adult (CMS HCC)     Neck problem     obstructive sleep apnea 01/2010    AHI 53.8    Osteoarthritis of back     Prostate cancer (CMS HCC) 12/11/2015    Rosacea  Tinnitus     Wears glasses     Glasses and contacts.           Allergies:   Allergies   Allergen Reactions    Sulfa (Sulfonamides) Rash    Shellfish Containing Products Itching and Swelling     Shrimp - eye swelling, itching         Current Medications:   Prior to Admission medications    Medication Sig Start Date End Date Taking? Authorizing Provider   Acetaminophen 500 mg Oral Capsule Take 1 Cap (500 mg total) by mouth Every 6 hours as needed 10/07/18   Glynn Octave, MD   atorvastatin (LIPITOR) 40 mg Oral Tablet Take 1 Tablet (40 mg total) by mouth Once a day 11/11/22   Dionicia Abler, PA-C   diclofenac sodium (VOLTAREN) 75 mg Oral Tablet, Delayed Release (E.C.) Take 1 Tablet (75 mg total) by mouth Twice daily 02/12/23   Rickards, Freida Busman, MD   dicyclomine (BENTYL) 20 mg Oral Tablet Take 1 Tablet (20 mg total) by mouth Four times a day 02/12/23   Rickards, Freida Busman, MD   fluticasone propionate (FLONASE) 50 mcg/actuation Nasal Spray, Suspension Administer 2 Sprays into each nostril Once a day 02/12/23   Rickards, Freida Busman, MD   GLUCOS CHOND CPLX ADVANCED ORAL Take  2 Tabs by mouth Once a day Glucosamine-Chondroitin 1500 mg    Provider, Historical   hydroCHLOROthiazide (HYDRODIURIL) 25 mg Oral Tablet Take 1 Tablet (25 mg total) by mouth Once a day 09/30/22   Rickards, Freida Busman, MD   losartan (COZAAR) 100 mg Oral Tablet Take 1 Tablet (100 mg total) by mouth Once a day 06/03/22   Marchelle Gearing, MD   metoprolol tartrate (LOPRESSOR) 50 mg Oral Tablet Take 1 Tablet (50 mg total) by mouth Twice daily 04/10/22   Rickards, Freida Busman, MD   metroNIDAZOLE 1 % Gel with Pump by Apply externally route Once a day    Provider, Historical   multivitamin Oral Tablet Take 1 Tablet by mouth Once a day    Provider, Historical   Non-Formulary/Special Preparation (MEDICATION HELP) CBD 2 capsules per day     Provider, Historical   Omeprazole 20 mg Oral Tablet, Delayed Release (E.C.) Take by mouth Once a day am    Provider, Historical   triamcinolone acetonide 0.1 % Cream  08/07/21   Provider, Historical   diclofenac sodium (VOLTAREN) 75 mg Oral Tablet, Delayed Release (E.C.) Take 1 Tablet (75 mg total) by mouth Twice daily 11/16/22 02/12/23  Rickards, Freida Busman, MD   dicyclomine (BENTYL) 20 mg Oral Tablet Take 1 Tablet (20 mg total) by mouth Four times a day 11/25/21 02/12/23  Rickards, Freida Busman, MD   fluticasone propionate (FLONASE) 50 mcg/actuation Nasal Spray, Suspension Administer 2 Sprays into each nostril Once a day 01/07/23 02/12/23  Marchelle Gearing, MD         Physical Exam: The patient's current vitals are BP (!) 153/76   Pulse 70   Temp 37.2 C (98.9 F)   Resp 18   Ht 1.753 m (5\' 9" )   Wt 128 kg (281 lb 12 oz)   SpO2 97%   BMI 41.61 kg/m       Constitutional: NAD, pleasant, appears stated age  Eyes: Conjunctiva clear, no drainage noted.   Oropharynx: Oropharynx Mallampati Class IV, dentition good   Neck: 17.5 Inches @ circothyroid membrane and neck supple  Musculoskeletal: Moving all extremities, No observed joint swelling or erythema  Skin: Skin warm and dry without discoloration  Psychiatric:  Patient is alert, displaying appropriate behavior and speech       PAP Compliance Report Data Review: 12/18/2022 - 02/22/2023  DME: Elisha Headland  Machine Model: Doy Mince II  Setting: CPAP 9 cm of H2O  Average usage (days): 5 hours 22 minutes  % of sleep >=4 hours use: 94%  Avg air leak time: 6.2  AHI: 1      ASSESSMENT/ PLAN:      1. Obstructive Sleep Apnea  *Good response and benefit of therapy  *Apnea well controlled.   *Continue to do well on positive airway pressure therapy with great subjective and objective compliance.  *Advise obtain replacement equipment every three to six months.   *Advised patient do not drive or operate heavy equipment or machinery if fatigued, drowsy or sleepy due to risk of injury.  *Pt informed comorbid conditions may worsen if sleep apnea is not treated adequately.    *Reviewed manufacturer recommended methods for machine and supply maintenance   *Supply orders renewed with DME        RTC: 1 year follow up or prior if patient has issues or problems.      Henry Carpenter, APRN,NP-C 02/24/23 13:00      Henry Cruise D. Taquila Leys, APRN, FNP-C  [facility]  Department of Medicine  Section of Pulmonary and Sleep Medicine

## 2023-02-25 ENCOUNTER — Ambulatory Visit (HOSPITAL_COMMUNITY): Payer: Medicare Other | Admitting: INTERNAL MEDICINE

## 2023-02-25 ENCOUNTER — Other Ambulatory Visit: Payer: Self-pay

## 2023-02-25 ENCOUNTER — Ambulatory Visit (HOSPITAL_BASED_OUTPATIENT_CLINIC_OR_DEPARTMENT_OTHER): Payer: Medicare Other | Admitting: INTERNAL MEDICINE

## 2023-02-25 ENCOUNTER — Ambulatory Visit
Admission: RE | Admit: 2023-02-25 | Discharge: 2023-02-25 | Disposition: A | Discharge status: Home / Self Care | Payer: Medicare Other | Source: Ambulatory Visit | Attending: NURSE PRACTITIONER-GERONTOLOGY | Admitting: NURSE PRACTITIONER-GERONTOLOGY

## 2023-02-25 DIAGNOSIS — M47816 Spondylosis without myelopathy or radiculopathy, lumbar region: Secondary | ICD-10-CM | POA: Insufficient documentation

## 2023-02-25 DIAGNOSIS — M48061 Spinal stenosis, lumbar region without neurogenic claudication: Secondary | ICD-10-CM

## 2023-02-25 DIAGNOSIS — M545 Low back pain, unspecified: Secondary | ICD-10-CM | POA: Insufficient documentation

## 2023-02-25 MED ORDER — LIDOCAINE (PF) 100 MG/5 ML (2 %) INTRAVENOUS SYRINGE
INJECTION | Freq: Once | INTRAVENOUS | Status: DC | PRN
Start: 2023-02-25 — End: 2023-02-25
  Administered 2023-02-25: 100 mg via INTRAVENOUS

## 2023-02-25 MED ORDER — EPHEDRINE SULFATE 5 MG/ML INTRAVENOUS SOLUTION
INTRAVENOUS | Status: AC
Start: 2023-02-25 — End: 2023-02-25
  Filled 2023-02-25: qty 10

## 2023-02-25 MED ORDER — ONDANSETRON HCL (PF) 4 MG/2 ML INJECTION SOLUTION
INTRAMUSCULAR | Status: AC
Start: 2023-02-25 — End: 2023-02-25
  Filled 2023-02-25: qty 2

## 2023-02-25 MED ORDER — MIDAZOLAM 1 MG/ML INJECTION SOLUTION
INTRAMUSCULAR | Status: AC
Start: 2023-02-25 — End: 2023-02-25
  Filled 2023-02-25: qty 2

## 2023-02-25 MED ORDER — EPHEDRINE SULFATE 5 MG/ML INTRAVENOUS SOLUTION
Freq: Once | INTRAVENOUS | Status: DC | PRN
Start: 2023-02-25 — End: 2023-02-25
  Administered 2023-02-25: 10 mg via INTRAVENOUS

## 2023-02-25 MED ORDER — SODIUM CHLORIDE 0.9 % INTRAVENOUS SOLUTION
INTRAVENOUS | Status: DC | PRN
Start: 2023-02-25 — End: 2023-02-25
  Administered 2023-02-25: 0 via INTRAVENOUS

## 2023-02-25 MED ORDER — PHENYLEPHRINE 1 MG/10 ML (100 MCG/ML) IN 0.9 % SOD.CHLORIDE IV SYRINGE
INJECTION | Freq: Once | INTRAVENOUS | Status: DC | PRN
Start: 2023-02-25 — End: 2023-02-25
  Administered 2023-02-25 (×2): 200 ug via INTRAVENOUS
  Administered 2023-02-25 (×2): 100 ug via INTRAVENOUS

## 2023-02-25 MED ORDER — FENTANYL (PF) 50 MCG/ML INJECTION SOLUTION
INTRAMUSCULAR | Status: AC
Start: 2023-02-25 — End: 2023-02-25
  Filled 2023-02-25: qty 2

## 2023-02-25 MED ORDER — ONDANSETRON HCL (PF) 4 MG/2 ML INJECTION SOLUTION
Freq: Once | INTRAMUSCULAR | Status: DC | PRN
Start: 2023-02-25 — End: 2023-02-25
  Administered 2023-02-25: 4 mg via INTRAVENOUS

## 2023-02-25 MED ORDER — MIDAZOLAM (PF) 1 MG/ML INJECTION SOLUTION
Freq: Once | INTRAMUSCULAR | Status: DC | PRN
Start: 2023-02-25 — End: 2023-02-25
  Administered 2023-02-25: 2 mg via INTRAVENOUS

## 2023-02-25 MED ORDER — DEXAMETHASONE SODIUM PHOSPHATE 4 MG/ML INJECTION SOLUTION
INTRAMUSCULAR | Status: AC
Start: 2023-02-25 — End: 2023-02-25
  Filled 2023-02-25: qty 1

## 2023-02-25 MED ORDER — DEXAMETHASONE SODIUM PHOSPHATE 4 MG/ML INJECTION SOLUTION
Freq: Once | INTRAMUSCULAR | Status: DC | PRN
Start: 2023-02-25 — End: 2023-02-25
  Administered 2023-02-25: 4 mg via INTRAVENOUS

## 2023-02-25 MED ORDER — FENTANYL (PF) 50 MCG/ML INJECTION SOLUTION
Freq: Once | INTRAMUSCULAR | Status: DC | PRN
Start: 2023-02-25 — End: 2023-02-25
  Administered 2023-02-25: 50 ug via INTRAVENOUS

## 2023-02-25 MED ORDER — LIDOCAINE (PF) 20 MG/ML (2 %) INJECTION SOLUTION
INTRAMUSCULAR | Status: AC
Start: 2023-02-25 — End: 2023-02-25
  Filled 2023-02-25: qty 5

## 2023-02-25 MED ORDER — PROPOFOL 10 MG/ML IV BOLUS
INJECTION | Freq: Once | INTRAVENOUS | Status: DC | PRN
Start: 2023-02-25 — End: 2023-02-25
  Administered 2023-02-25: 200 mg via INTRAVENOUS

## 2023-02-25 NOTE — Anesthesia Transfer of Care (Signed)
ANESTHESIA TRANSFER OF CARE   Henry [name] is a 69 y.o. ,male,     had * No procedures listed *  performed  02/25/23   Primary Service:     Past Medical History:   Diagnosis Date    Arthritis     Back problem     Cancer (CMS HCC) 11/2015    prostate cancer, melanoma back neck    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)     Chronic pain     CPAP (continuous positive airway pressure) dependence     CPAP 9 cm H2O    CPAP (continuous positive airway pressure) dependence     Deep vein thrombosis (DVT) (CMS HCC)     left leg    Eczema     GERD (gastroesophageal reflux disease)     well controlled on omeprazole    Heartburn     HTN     Hyperlipidemia     Hypertension     Irritable bowel syndrome     Melanoma (CMS HCC)     Morbid obesity with BMI of 40.0-44.9, adult (CMS HCC)     Neck problem     obstructive sleep apnea 01/2010    AHI 53.8    Osteoarthritis of back     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      Allergy History as of 02/25/23       SULFA (SULFONAMIDES)         Noted Status Severity Type Reaction    10/25/07 Alvis Lemmings, RN 10/25/07 Active   Rash              SHELLFISH CONTAINING PRODUCTS         Noted Status Severity Type Reaction    05/16/13 1057 Lavena Bullion 04/04/13 Active Low  Itching, Swelling    Comments: Shrimp - eye swelling, itching     04/04/13 1402 Kathaleen Bury, MA 04/04/13 Active       Comments: shrimp                   I completed my transfer of care / handoff to the receiving personnel during which we discussed:  Access, Airway, All key/critical aspects of case discussed, Analgesia, Antibiotics, Expectation of post procedure, Fluids/Product, Gave opportunity for questions and acknowledgement of understanding, Labs and PMHx      Post Location: PACU                        Additional Info:Report given to PACU RN. AQA. VSS.     Eusebio Me, MD                                       Last  OR Temp: Temperature: (!) 35.2 C (95.4 F)  ABG:  POTASSIUM   Date Value Ref Range Status   10/08/2022 4.2 3.5 - 5.1 mmol/L Final   05/23/2013 4.5 3.5 - 5.1 mmol/L Final     KETONES   Date Value Ref Range Status   09/15/2021 Negative Negative mg/dL Final     CALCIUM   Date Value Ref Range Status   10/08/2022 9.4 8.6 - 10.3 mg/dL Final     Comment:  Gadolinium-containing contrast can interfere with calcium measurement.     05/23/2013 8.6 8.5 - 10.4 mg/dL Final     Calculated P Axis   Date Value Ref Range Status   05/26/2022 42 degrees Final     Calculated R Axis   Date Value Ref Range Status   05/26/2022 31 degrees Final     Calculated T Axis   Date Value Ref Range Status   05/26/2022 62 degrees Final     Airway:* No LDAs found *  Blood pressure (!) 152/78, pulse 69, temperature (!) 35.2 C (95.4 F), resp. rate 12, SpO2 100%.

## 2023-02-25 NOTE — Nurses Notes (Signed)
Patient arrived for his MRI under anesthesia, accompanied by his wife. He changed into gown. IV started, 22 g to left hand, x1 attempt. Dr. Haynes Bast into explain the anesthesia and obtain a signed consent.

## 2023-02-25 NOTE — Nurses Notes (Signed)
Patient returned to preanesthesia baseline at this time. PIV removed. Discharge instructions provided to and discussed with patient and responsible party. Escorted to lobby with patient navigator.     Letta Median, RN

## 2023-02-25 NOTE — Anesthesia Preprocedure Evaluation (Signed)
ANESTHESIA PRE-OP EVALUATION  Planned Procedure: MRI SPINE LUMBOSACRAL WO CONTRAST  Review of Systems     anesthesia history negative     patient summary reviewed  nursing notes reviewed        Pulmonary   sleep apnea and CPAP,   Cardiovascular    Hypertension, well controlled, ACE / ARB inhibitor use, ACE inhibitor taken in the last 24 hours and DVT ,No peripheral edema,  Exercise Tolerance: > or = 4 METS   ,beta blocker therapy  ,taken in last 24 hours     GI/Hepatic/Renal    GERD and well controlled        Endo/Other    morbid obesity,      Neuro/Psych/MS    back abnormality, Neck problems     Cancer  prostate cancer,                       Physical Assessment      Airway       Mallampati: II    TM distance: >3 FB    Neck ROM: full  Mouth Opening: good.  Facial hair  No Beard        Dental       Dentition intact             Pulmonary    Breath sounds clear to auscultation  (-) no rhonchi, no decreased breath sounds, no wheezes, no rales and no stridor     Cardiovascular    Rhythm: regular  Rate: Normal  (-) no friction rub, carotid bruit is not present, no peripheral edema and no murmur     Other findings              Plan  ASA 3     Planned anesthesia type: general     general anesthesia with endotracheal tube intubation      PONV Plan:  I plan to administer pharmcologic prophalaxis antiemetics              Intravenous induction     Anesthesia issues/risks discussed are: Dental Injuries, Eye /Visual Loss, PONV, Nerve Injuries, Sore Throat, Aspiration, Stroke, Cardiac Events/MI and Difficult Airway.  Anesthetic plan and risks discussed with patient  signed consent obtained      Use of blood products discussed with patient who consented to blood products.      Patient's NPO status is appropriate for Anesthesia.           Eusebio Me, MD  )

## 2023-02-25 NOTE — Anesthesia Postprocedure Evaluation (Signed)
Anesthesia Post Op Evaluation    Patient: Henry [name]  * No procedures listed *    Last Vitals:Temperature: (!) 35.2 C (95.4 F) (02/25/23 0953)  Heart Rate: 69 (02/25/23 0953)  BP (Non-Invasive): (!) 152/78 (02/25/23 1191)  Respiratory Rate: 12 (02/25/23 0953)  SpO2: 100 % (02/25/23 0953)    No notable events documented.      Patient location during evaluation: PACU       Patient participation: complete - patient participated  Level of consciousness: responsive to verbal stimuli and sleepy but conscious    Pain management: adequate  Airway patency: patent    Anesthetic complications: no  Cardiovascular status: acceptable  Respiratory status: acceptable  Hydration status: acceptable    PONV Status: Absent

## 2023-02-26 ENCOUNTER — Ambulatory Visit (INDEPENDENT_AMBULATORY_CARE_PROVIDER_SITE_OTHER): Payer: Self-pay

## 2023-02-26 NOTE — Telephone Encounter (Signed)
Informed pt MRI is in and will be reviewed at next visit which has been moved up to 03/16/22. No new RFSx. Pt just mentions same (not worsening) tingling in hands and toes.    Denies worsening pain/symptoms, bladder retention, bowel/bladder incontinence, saddle anesthesia, new/worsening upper extremity or lower extremity weakness.  Jannet Mantis, RN

## 2023-03-02 ENCOUNTER — Ambulatory Visit (HOSPITAL_BASED_OUTPATIENT_CLINIC_OR_DEPARTMENT_OTHER): Payer: Self-pay | Admitting: Nurse Practitioner

## 2023-03-02 NOTE — Telephone Encounter (Signed)
Sleep office sent script to DME for pap supply renewal [employer]  via fax job number  [id number]    PRESSURE CPAP 9  HEATED HUMIDIFICATION  MASK NASAL MASK FFC  HEADGEAR  FILTERS  TUBING  WATER CHAMBER    Haidyn Chadderdon, RPSGT  03/02/2023, 04:19

## 2023-03-05 ENCOUNTER — Ambulatory Visit (HOSPITAL_BASED_OUTPATIENT_CLINIC_OR_DEPARTMENT_OTHER): Payer: Self-pay | Admitting: Nurse Practitioner

## 2023-03-05 NOTE — Telephone Encounter (Signed)
Sleep office sent script to DME [employer] for pap supply renewal  via fax job number [phone]    PRESSURE   HEATED HUMIDIFICATION  MASK Nasal   HEADGEAR  FILTERS  TUBING  WATER CHAMBER    Villa Herb, RRT,RPSGT  03/05/2023, 14:49

## 2023-03-17 ENCOUNTER — Encounter (INDEPENDENT_AMBULATORY_CARE_PROVIDER_SITE_OTHER): Payer: Self-pay

## 2023-03-17 ENCOUNTER — Other Ambulatory Visit: Payer: Self-pay

## 2023-03-17 ENCOUNTER — Ambulatory Visit: Payer: Medicare Other

## 2023-03-17 VITALS — BP 157/82 | HR 71 | Temp 96.4°F | Resp 20 | Ht 69.02 in | Wt 275.7 lb

## 2023-03-17 DIAGNOSIS — M48061 Spinal stenosis, lumbar region without neurogenic claudication: Secondary | ICD-10-CM | POA: Insufficient documentation

## 2023-03-17 DIAGNOSIS — M47816 Spondylosis without myelopathy or radiculopathy, lumbar region: Secondary | ICD-10-CM | POA: Insufficient documentation

## 2023-03-17 DIAGNOSIS — M545 Low back pain, unspecified: Secondary | ICD-10-CM | POA: Insufficient documentation

## 2023-03-17 DIAGNOSIS — M791 Myalgia, unspecified site: Secondary | ICD-10-CM | POA: Insufficient documentation

## 2023-03-17 NOTE — Progress Notes (Signed)
[facility]  [address], [address]  [place], [place] [address]  [phone]    Progress Note    Henry [name]  MRN: [id number]  DOB: 11/14/1955  Date of Service: 03/17/2023    CHIEF COMPLAINT  Chief Complaint   Patient presents with    Low Back Pain    After Procedure Follow Up       SUBJECTIVE  Henry [name] is a 68 y.o. male who presents to clinic for procedure follow up. Patient had bilateral L4-S1 RFA with Dr. Shawnie Pons performed on 02/23/23. Patient endorses 90% relief  still lasting . Denies any side effects or complication with the procedure. Mobility increased post procedure. He has tingling and numbness in hands, reports previous EMG showed carpal tunnel. He will occasionally have sharp pain in big toe on right side. He is overall satisfied with relief. He continues to workout three times a week. He denies red flag signs today.      Previous Update from [name], NP on 09/09/22:  Henry [name] is a 68 y.o. male who presents to clinic for procedure follow up. Patient had bilateral L4-S1 RFA with Dr. Shawnie Pons performed on 08/07/22. Patient endorses 95% relief  still lasting . Denies any side effects or complication with the procedure. Pt reports going to the grocery store and going to the gym are easier post procedure.  Patient reports having pain in paraspinal muscles of the mid-back after prolonged walking. Patient states he's been working out for 2 hours, 3 days a week, and has been walking everyday. Patient reports brief "nerve pain" in left lumbar area after completing "bridge stretch," but states the pain isn't as severe as it once was and subsides quickly. Pt denies any red flag signs today. Overall, patient is very satisfied with relief.      TREATMENT HISTORY:        Helpful Not Helpful Not Tried  Comments   MBB X     Patient had Right L4-S1 LMBB #1 with Dr. Shawnie Pons performed on 03/31/22 and Left L4-S1 LMBB #1 on 04/07/22. Patient endorses 90% relief 1 day(s)  for both procedures.    RFA/Cryoablation X     Patient had bilateral L4-S1 RFA with Dr. Shawnie Pons performed on 02/23/23. Patient endorses 90% relief  still lasting   Patient had bilateral L4-S1 RFA with Dr. Shawnie Pons performed on 08/07/22. Patient endorses 95% relief  still lasting .    Epidural X     07/22/21 L4/5 LESI 50% relief for couple months    Transforaminal/  Nerve root block            SI injection           Intraarticular Joint            Sympathetic Block           Other            TPI X     Patient had TPIs performed by Dr. Shawnie Pons on 11/03/22. Patient endorses 75% relief still lasting.   GTB            SCS           Pain pump              Physical Therapy or HEP            Massage Therapy           Acupuncture  Chiropractor           Exercise Physiologist            Counseling/CBT                Medications:   Acetaminophen 500 mg Oral Capsule, Take 1 Cap (500 mg total) by mouth Every 6 hours as needed  atorvastatin (LIPITOR) 40 mg Oral Tablet, Take 1 Tablet (40 mg total) by mouth Once a day  diclofenac sodium (VOLTAREN) 75 mg Oral Tablet, Delayed Release (E.C.), Take 1 Tablet (75 mg total) by mouth Twice daily  dicyclomine (BENTYL) 20 mg Oral Tablet, Take 1 Tablet (20 mg total) by mouth Four times a day  fluticasone propionate (FLONASE) 50 mcg/actuation Nasal Spray, Suspension, Administer 2 Sprays into each nostril Once a day  GLUCOS CHOND CPLX ADVANCED ORAL, Take 2 Tabs by mouth Once a day Glucosamine-Chondroitin 1500 mg  hydroCHLOROthiazide (HYDRODIURIL) 25 mg Oral Tablet, Take 1 Tablet (25 mg total) by mouth Once a day  losartan (COZAAR) 100 mg Oral Tablet, Take 1 Tablet (100 mg total) by mouth Once a day  metoprolol tartrate (LOPRESSOR) 50 mg Oral Tablet, Take 1 Tablet (50 mg total) by mouth Twice daily  metroNIDAZOLE 1 % Gel with Pump, by Apply externally route Once a day  multivitamin Oral Tablet, Take 1 Tablet by mouth Once a day  Non-Formulary/Special Preparation (MEDICATION HELP), CBD 2 capsules  per day   Omeprazole 20 mg Oral Tablet, Delayed Release (E.C.), Take by mouth Once a day am  triamcinolone acetonide 0.1 % Cream,     No facility-administered medications prior to visit.      Allergies:   Allergies   Allergen Reactions    Sulfa (Sulfonamides) Rash    Shellfish Containing Products Itching and Swelling     Shrimp - eye swelling, itching       Review of Systems:   CONSTITUTIONAL: Denies weight change. Denies fever.   GI: Denies bowel incontinence.  GU: Denies urinary incontinence or retention.  NEUROLOGICAL: Denies paralysis. Denies tremors    PSYCHIATRIC: Denies acute changes in mood.  MUSCULOSKELETAL: See HPI.    OBJECTIVE  BP (!) 157/82   Pulse 71   Temp (!) 35.8 C (96.4 F)   Resp 20   Ht 1.753 m (5' 9.02")   Wt 125 kg (275 lb 11.2 oz)   SpO2 98%   BMI 40.69 kg/m     General: no distress   Abdomen: soft, non-tender and non-distended   Respiratory: No increased work of breathing. No increased accessory muscle use   Cardiovascular: acyanotic, no JVD  Skin: warm and dry, no rash  Neurologic: gait is antalgic , motor 5/5 and sensory intact in b/l UEs, AOx3, CN 2-12 grossly intact, negative Hoffman's b/l  Deep Tendon Reflexes    Brachioradialis Bicep Patellar Achilles   Right  2+ 2+ 2+ 2+   Left 2+ 2+ 2+ 2+     Psychiatric: normal affect and behavior  Musculoskeletal: no cyanosis or edema    Lumbar   ROM: Limited extension and Limited rotation   Palpation: Tender   Motor: 5/5 in LEs b/l   Sensory: Intact in LEs b/l   Facet loading: Negative bilaterally    Facet TTP: Negative bilaterally    Trigger points:  Lumbar paraspinal bilateral   Straight leg raise:  Negative bilaterally        Nursing Notes:   Kerman Passey, Ambulatory Care Assistant  03/17/23 1325  Signed  Procedure Follow  Up  Henry [name]  [id number]  Chief Complaint   Patient presents with    Low Back Pain    After Procedure Follow Up       [place] Pain Rating Scale     On a scale of 0-10, during the past 24 hours, pain has  interfered with you usual activity: 3     On a scale of 0-10, during the past 24 hours, pain has interfered with your sleep: 1    On a scale of 0-10, during the past 24 hours, pain has affected your mood: 0     On a scale of 0-10, during the past 24 hours, pain has contributed to your stress: 0     On a scale of 0-10, what is your overall pain Rating: 3      Vitals:    03/17/23 1321   BP: (!) 157/82   Pulse: 71   Resp: 20   Temp: (!) 35.8 C (96.4 F)   SpO2: 98%   Weight: 125 kg (275 lb 11.2 oz)   Height: 1.753 m (5' 9.02")   PainSc:   3   PainLoc: Back     Body mass index is 40.69 kg/m.    New imaging:  MRI L-spine 02/25/23  Patient is here today S/ P bilateral lumbar RFA that was done on 02/23/23 and reports 90 % relief that is lasting.    Kerman Passey, Ambulatory Care Assistant  03/17/2023, 13:24        Imaging: Reviewed    Recent Results (from the past [id number] hour(s))   MRI SPINE LUMBOSACRAL WO CONTRAST    Collection Time: 02/25/23 11:16 AM    Narrative    CLINICAL INFORMATION:  Kaynen MICHAEL Bart  Male, 68 years old.    MRI SPINE LUMBOSACRAL WO CONTRAST performed on 02/25/2023 11:16 AM.    REASON FOR EXAM:  M54.50: Low back pain, unspecified back pain laterality, unspecified chronicity, unspecified whether sciatica present  M47.816: Lumbar spondylosis      COMPARISON: MRI dated 06/03/2020    TECHNIQUE: MRI of the lumbosacral spine was performed  without contrast.     FINDINGS:   Lumbar lordosis is maintained with stable grade 1 anterolisthesis of L4 on L5. The vertebral body heights are maintained in the lumbar spine. There is no marrow edema.  There is multilevel desiccation and height loss of the intervertebral discs, most pronounced at L5-S1 where it is advanced on the right.  The conus medullaris terminates at L1. There is new tortuosity of the cauda equina nerve roots.  T12-L1: No significant spinal canal or foraminal stenosis.  L1-2: No significant spinal canal or foraminal stenosis.  L2-3: Central disc  bulge and facet hypertrophy contribute to mild spinal canal stenosis, mild right and no significant left foraminal stenosis.  L3-4: Broad-based disc bulge, endplate osteophyte formation, facet hypertrophy, and endplate osteophyte formation contribute to mild spinal canal stenosis, moderate right and severe left foraminal stenosis.  L4-5: Anterolisthesis with disc uncovering, facet hypertrophy, and ligamentum flavum thickening contribute to moderate to severe spinal canal stenosis, moderate right and severe left foraminal stenosis. Lateral disc material contacts the left greater than right exiting L4 nerve roots in the extraforaminal zone. There is a small left facet joint effusion.  L5-S1: Broad-based disc bulge, facet hypertrophy, and endplate osteophyte formation do not contribute to significant spinal canal stenosis, and contribute to moderate bilateral foraminal stenosis. Lateral disc material contacts the right greater than left exiting L5 nerve roots  in the extraforaminal zone.  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      Impression     Spondylotic changes of the lumbar spine as described above, most pronounced at L4-5 where there is moderate to severe spinal canal stenosis. There is new tortuosity of the cauda equina nerve roots.           ASSESSMENT    ICD-10-CM    1. Low back pain, unspecified back pain laterality, unspecified chronicity, unspecified whether sciatica present  M54.50 XR LUMBAR SPINE AP/LAT W/FLEX EXTENSION     Refer to [facility]      2. Lumbar spondylosis  M47.816 XR LUMBAR SPINE AP/LAT W/FLEX EXTENSION     Refer to [facility]      3. Myalgia  M79.10 Refer to [facility]      4. Lumbar stenosis  M48.061 Refer to [facility]            PLAN/RECOMMENDATION  Orders Placed This Encounter    XR LUMBAR SPINE AP/LAT W/FLEX EXTENSION    Refer to [facility]     1. Lumbar MRI reviewed with patient. Answered all questions.   2. Flex/Ex  Lumbar X-Ray ordered to assess for instability. Pt verbalized understanding he may walk in to any [place] facility to obtain.   3. Recommend ortho consult for further evaluation of carpal tunnel but pt declining at this time.   4. Continue workout regimen. Will re-send to movement specialist for guidance on workout regimen.   5. Recommended dietitian, declining at this time.   6. Educated RFA can be repeated as soon as six months.   7. Follow up PRN.    Patient informed of red flags symptoms like caudal anesthesia, bowel, bladder incontinence including the inability to urinate, weakness of legs that they need to go to the ED emergently for possible impingement of the spinal nerve roots and may need urgent decompression. Patient confirmed understanding.     Our impression, treatment recommendations and plan from today's visit were reviewed in detail with the patient in the office. All of the patient's questions were answered. If a procedure was ordered, it was explained using a spine model,  including technique, benefits, alternatives and the associated risks.   The patient verbalized understanding of the above plan and the patient wishes to move forward with the above noted plan.    [name], FNP-C 03/17/2023, 13:50

## 2023-03-17 NOTE — Nursing Note (Signed)
Procedure Follow Up  [facility]  [id number]  Chief Complaint   Patient presents with    Low Back Pain    After Procedure Follow Up       [place] Pain Rating Scale     On a scale of 0-10, during the past 24 hours, pain has interfered with you usual activity: 3     On a scale of 0-10, during the past 24 hours, pain has interfered with your sleep: 1    On a scale of 0-10, during the past 24 hours, pain has affected your mood: 0     On a scale of 0-10, during the past 24 hours, pain has contributed to your stress: 0     On a scale of 0-10, what is your overall pain Rating: 3      Vitals:    03/17/23 1321   BP: (!) 157/82   Pulse: 71   Resp: 20   Temp: (!) 35.8 C (96.4 F)   SpO2: 98%   Weight: 125 kg (275 lb 11.2 oz)   Height: 1.753 m (5' 9.02")   PainSc:   3   PainLoc: Back     Body mass index is 40.69 kg/m.    New imaging:  MRI L-spine 02/25/23  Patient is here today S/ P bilateral lumbar RFA that was done on 02/23/23 and reports 90 % relief that is lasting.    Kerman Passey, Ambulatory Care Assistant  03/17/2023, 13:24

## 2023-03-24 ENCOUNTER — Other Ambulatory Visit (HOSPITAL_BASED_OUTPATIENT_CLINIC_OR_DEPARTMENT_OTHER): Payer: Self-pay | Admitting: Student in an Organized Health Care Education/Training Program

## 2023-03-24 DIAGNOSIS — I10 Essential (primary) hypertension: Secondary | ICD-10-CM

## 2023-04-02 ENCOUNTER — Ambulatory Visit
Admission: RE | Admit: 2023-04-02 | Discharge: 2023-04-02 | Disposition: A | Discharge status: Home / Self Care | Payer: Medicare Other | Source: Ambulatory Visit

## 2023-04-02 DIAGNOSIS — M545 Low back pain, unspecified: Secondary | ICD-10-CM | POA: Insufficient documentation

## 2023-04-02 DIAGNOSIS — M47816 Spondylosis without myelopathy or radiculopathy, lumbar region: Secondary | ICD-10-CM | POA: Insufficient documentation

## 2023-04-05 ENCOUNTER — Ambulatory Visit (INDEPENDENT_AMBULATORY_CARE_PROVIDER_SITE_OTHER): Payer: Self-pay

## 2023-04-05 DIAGNOSIS — M47816 Spondylosis without myelopathy or radiculopathy, lumbar region: Secondary | ICD-10-CM

## 2023-04-05 DIAGNOSIS — M5136 Other intervertebral disc degeneration, lumbar region with discogenic back pain only: Secondary | ICD-10-CM

## 2023-04-13 ENCOUNTER — Encounter (INDEPENDENT_AMBULATORY_CARE_PROVIDER_SITE_OTHER): Payer: Self-pay

## 2023-04-15 ENCOUNTER — Ambulatory Visit (INDEPENDENT_AMBULATORY_CARE_PROVIDER_SITE_OTHER): Payer: Self-pay

## 2023-04-21 ENCOUNTER — Ambulatory Visit (HOSPITAL_BASED_OUTPATIENT_CLINIC_OR_DEPARTMENT_OTHER): Payer: Self-pay | Admitting: Student in an Organized Health Care Education/Training Program

## 2023-04-29 ENCOUNTER — Ambulatory Visit: Payer: Self-pay

## 2023-04-29 ENCOUNTER — Ambulatory Visit (INDEPENDENT_AMBULATORY_CARE_PROVIDER_SITE_OTHER): Payer: Self-pay

## 2023-04-29 DIAGNOSIS — M48062 Spinal stenosis, lumbar region with neurogenic claudication: Secondary | ICD-10-CM

## 2023-04-29 DIAGNOSIS — M791 Myalgia, unspecified site: Secondary | ICD-10-CM

## 2023-04-29 DIAGNOSIS — M545 Low back pain, unspecified: Secondary | ICD-10-CM

## 2023-04-29 DIAGNOSIS — M47816 Spondylosis without myelopathy or radiculopathy, lumbar region: Secondary | ICD-10-CM

## 2023-04-29 DIAGNOSIS — M48061 Spinal stenosis, lumbar region without neurogenic claudication: Secondary | ICD-10-CM

## 2023-04-29 NOTE — Progress Notes (Signed)
 [facility]  [address], [address]  [place], [place] [address]  [phone]        TELEMEDICINE DOCUMENTATION: Video visit  Patient Location: Home address   Patient/family aware of provider location:  Yes  Patient/family consent for telemedicine:  Yes  Examination observed and performed by:  [name], FNP-C    CC:   Chief Complaint   Patient presents with    Low Back Pain       HPI: 68 y.o. male contacted today via telemedicine for lumbar X-Ray follow up. He is inquiring about more permanent solutions for his pain today.     Positive for neurogenic claudication. Symptoms are present with prolonged standing, walking and lifting heavy loads between 25 and 50 lbs. Pain is alleviated with sitting or bending forward. He leans over a grocery cart for relief when shopping.      Description: dull, ache  Timing: constant  Location: low back left side worse  Onset: 2 weeks ago   Inciting event: very activities over last two weeks, rode a bus, slept in different mattress, and was up and down stairs .  Radiating: had burning in anterior legs, but this has resolved now.   Worse with: bending over, prolonged standing and walking  Better with: sitting  Treatments tried: Voltaren, Tylenol  Red flag: denies dropping objects, denies issues with dexterity, denies bowel or bladder incontinence , and denies saddle anesthesia     TREATMENT HISTORY:        Helpful Not Helpful Not Tried  Comments   MBB X     Patient had Right L4-S1 LMBB #1 with Dr. Shawnie Pons performed on 03/31/22 and Left L4-S1 LMBB #1 on 04/07/22. Patient endorses 90% relief 1 day(s) for both procedures.    RFA/Cryoablation X     Patient had bilateral L4-S1 RFA with Dr. Shawnie Pons performed on 02/23/23. Patient endorses 90% relief  still lasting   Patient had bilateral L4-S1 RFA with Dr. Shawnie Pons performed on 08/07/22. Patient endorses 95% relief  still lasting .    Epidural X     07/22/21 L4/5 LESI 50% relief for couple months    Transforaminal/  Nerve  root block            SI injection           Intraarticular Joint            Sympathetic Block           Other            TPI X     Patient had TPIs performed by Dr. Shawnie Pons on 11/03/22. Patient endorses 75% relief still lasting.   GTB            SCS           Pain pump              Physical Therapy or HEP            Massage Therapy           Acupuncture            Chiropractor           Exercise Physiologist            Counseling/CBT                Ault Pain Rating Scale     On a scale of 0-10, during the past 24 hours, pain has interfered with  your usual activity: 7      On a scale of 0-10, during the past 24 hours, pain has interfered with your sleep: 5     On a scale of 0-10, during the past 24 hours, pain has affected your mood: 5     On a scale of 0-10, during the past 24 hours, pain has contributed to your stress: 5      On a scale of 0-10, what is your overall pain Rating: 6     PAST MEDICAL/ FAMILY/ SOCIAL HISTORY:  Past Medical History:   Diagnosis Date    Arthritis     Back problem     Cancer (CMS HCC) 11/2015    prostate cancer, melanoma back neck    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    Chronic pain     CPAP (continuous positive airway pressure) dependence     CPAP 9 cm H2O    CPAP (continuous positive airway pressure) dependence     Deep vein thrombosis (DVT) (CMS HCC)     left leg    Eczema     GERD (gastroesophageal reflux disease)     well controlled on omeprazole    Heartburn     HTN     Hyperlipidemia     Hypertension     Irritable bowel syndrome     Melanoma (CMS HCC)     Morbid obesity with BMI of 40.0-44.9, adult (CMS HCC) 09/16/2021    Neck problem     obstructive sleep apnea 01/2010    AHI 53.8    Osteoarthritis of back     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     Allergies   Allergen Reactions    Sulfa (Sulfonamides) Rash    Shellfish Containing Products Itching and Swelling     Shrimp -  eye swelling, itching     Current Outpatient Medications   Medication Sig    Acetaminophen 500 mg Oral Capsule Take 1 Cap (500 mg total) by mouth Every 6 hours as needed    atorvastatin (LIPITOR) 40 mg Oral Tablet Take 1 Tablet (40 mg total) by mouth Once a day    diclofenac sodium (VOLTAREN) 75 mg Oral Tablet, Delayed Release (E.C.) Take 1 Tablet (75 mg total) by mouth Twice daily    dicyclomine (BENTYL) 20 mg Oral Tablet Take 1 Tablet (20 mg total) by mouth Four times a day    fluticasone propionate (FLONASE) 50 mcg/actuation Nasal Spray, Suspension Administer 2 Sprays into each nostril Once a day    GLUCOS CHOND CPLX ADVANCED ORAL Take 2 Tabs by mouth Once a day Glucosamine-Chondroitin 1500 mg    hydroCHLOROthiazide (HYDRODIURIL) 25 mg Oral Tablet Take 1 Tablet (25 mg total) by mouth Once a day    losartan (COZAAR) 100 mg Oral Tablet Take 1 Tablet (100 mg total) by mouth Once a day    metoprolol tartrate (LOPRESSOR) 50 mg Oral Tablet Take 1 Tablet (50 mg total) by mouth Twice daily    metroNIDAZOLE 1 % Gel with Pump by Apply externally route Once a day    multivitamin Oral Tablet Take 1 Tablet by mouth Once a day    Non-Formulary/Special Preparation (MEDICATION HELP) CBD 2 capsules per day     Omeprazole 20 mg Oral Tablet, Delayed Release (E.C.) Take by mouth Once a  day am    triamcinolone acetonide 0.1 % Cream      Past Surgical History:   Procedure Laterality Date    GALLBLADDER SURGERY      HX ANKLE FRACTURE TX Right     HX APPENDECTOMY      HX ERCP      HX HIP REPLACEMENT Right 05/22/2013    Per Dr. Graciela Husbands    PB COLONOSCOPY,DIAGNOSTIC       Social History     Tobacco Use    Smoking status: Former     Types: Cigars     Quit date: 2012     Years since quitting: 13.2    Smokeless tobacco: Never    Tobacco comments:     quit cigars in 2012   Vaping Use    Vaping status: Never Used   Substance Use Topics    Alcohol use: Yes     Alcohol/week: 2.0 standard drinks of alcohol     Types: 2 Cans of beer per week      Comment: per week    Drug use: No     Comment: capsule     Family Medical History:       Problem Relation (Age of Onset)    Asthma Father    Atherosclerosis Paternal Grandmother    Breast Cancer Maternal Grandmother    COPD Father    Cancer Mother, Father    Coronary Artery Disease Mother    Diabetes Mother    High Cholesterol Mother    Hypertension (High Blood Pressure) Mother    Melanoma Brother    Migraines Mother    Other Maternal Grandfather    Prostate Cancer Paternal Grandfather    Stomach Cancer Maternal Grandmother              Review of Systems:   CONSTITUTIONAL: Denies weight change. Denies fever.   GI: Denies bowel incontinence.  GU: Denies urinary incontinence or retention.  NEUROLOGICAL: Denies paralysis. Denies tremors    PSYCHIATRIC: Denies acute changes in mood.  MUSCULOSKELETAL: See HPI.     PHYSICAL EXAMINATION:  Gen: appears stated age, NAD, well-developed  Psych: normal affect, normal mood  Head: AT, NC   Eyes: conjunctiva clear, non-icteric  Pulm: non-labored, no acute respiratory distress  Neuro: alert and oriented      IMAGING:  Recent Results (from the past [id number] hours)   MRI SPINE LUMBOSACRAL WO CONTRAST    Collection Time: 02/25/23 11:16 AM    Narrative    CLINICAL INFORMATION:  Victorious MICHAEL Vanderzanden  Male, 68 years old.    MRI SPINE LUMBOSACRAL WO CONTRAST performed on 02/25/2023 11:16 AM.    REASON FOR EXAM:  M54.50: Low back pain, unspecified back pain laterality, unspecified chronicity, unspecified whether sciatica present  M47.816: Lumbar spondylosis      COMPARISON: MRI dated 06/03/2020    TECHNIQUE: MRI of the lumbosacral spine was performed  without contrast.     FINDINGS:   Lumbar lordosis is maintained with stable grade 1 anterolisthesis of L4 on L5. The vertebral body heights are maintained in the lumbar spine. There is no marrow edema.  There is multilevel desiccation and height loss of the intervertebral discs, most pronounced at L5-S1 where it is advanced on the right.  The  conus medullaris terminates at L1. There is new tortuosity of the cauda equina nerve roots.  T12-L1: No significant spinal canal or foraminal stenosis.  L1-2: No significant spinal canal or foraminal stenosis.  L2-3: Central  disc bulge and facet hypertrophy contribute to mild spinal canal stenosis, mild right and no significant left foraminal stenosis.  L3-4: Broad-based disc bulge, endplate osteophyte formation, facet hypertrophy, and endplate osteophyte formation contribute to mild spinal canal stenosis, moderate right and severe left foraminal stenosis.  L4-5: Anterolisthesis with disc uncovering, facet hypertrophy, and ligamentum flavum thickening contribute to moderate to severe spinal canal stenosis, moderate right and severe left foraminal stenosis. Lateral disc material contacts the left greater than right exiting L4 nerve roots in the extraforaminal zone. There is a small left facet joint effusion.  L5-S1: Broad-based disc bulge, facet hypertrophy, and endplate osteophyte formation do not contribute to significant spinal canal stenosis, and contribute to moderate bilateral foraminal stenosis. Lateral disc material contacts the right greater than left exiting L5 nerve roots in the extraforaminal zone.  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      Impression     Spondylotic changes of the lumbar spine as described above, most pronounced at L4-5 where there is moderate to severe spinal canal stenosis. There is new tortuosity of the cauda equina nerve roots.            Study Result    Narrative & Impression   Parminder MICHAEL Wivell  Male, 68 years old.     XR LUMBAR SPINE AP/LAT W/FLEX EXTENSION performed on 04/02/2023 12:05 PM.     REASON FOR EXAM:  M54.50: Low back pain, unspecified back pain laterality, unspecified chronicity, unspecified whether sciatica present  M47.816: Lumbar spondylosis     TECHNIQUE: 2 views/4 images submitted for interpretation.     COMPARISON:  06/10/2021     IMPRESSION:  FINDINGS AND  IMPRESSION:  Vertebral body heights and alignment are unchanged. Multilevel degenerative disc disease and facet arthropathy. Surgical changes in the pelvis. Alignment is stable flexion and extension     LABS:  CBC  Diff   Lab Results   Component Value Date/Time    WBC 11.2 (H) 06/16/2022 06:40 AM    HGB 12.2 (L) 06/16/2022 06:40 AM    HCT 36.4 (L) 06/16/2022 06:40 AM    PLTCNT 218 06/16/2022 06:40 AM    RBC 4.31 (L) 06/16/2022 06:40 AM    MCV 84.5 06/16/2022 06:40 AM    MCHC 33.5 06/16/2022 06:40 AM    MCH 28.3 06/16/2022 06:40 AM    RDW 13.9 09/10/2016 08:16 AM    MPV 9.0 06/16/2022 06:40 AM    Lab Results   Component Value Date/Time    PMNS 57.4 05/26/2022 03:10 PM    LYMPHOCYTES 32 12/25/2015 01:28 PM    EOSINOPHIL 4 12/25/2015 01:28 PM    MONOCYTES 7.3 05/26/2022 03:10 PM    BASOPHILS 0.5 05/26/2022 03:10 PM    BASOPHILS <0.10 05/26/2022 03:10 PM    PMNABS 4.32 05/26/2022 03:10 PM    LYMPHSABS 2.31 05/26/2022 03:10 PM    EOSABS 0.28 05/26/2022 03:10 PM    MONOSABS 0.55 05/26/2022 03:10 PM    BASOSABS 0.062 09/21/2013 04:20 PM          COMPREHENSIVE METABOLIC PANEL  Lab Results   Component Value Date    SODIUM 139 10/08/2022    POTASSIUM 4.2 10/08/2022    CHLORIDE 104 10/08/2022    CO2 28 10/08/2022    ANIONGAP 7 10/08/2022    BUN 15 10/08/2022    CREATININE 0.83 10/08/2022    GLUCOSENF 120 10/26/2018    GLUCOSE 133 (H) 10/08/2022    CALCIUM 9.4 10/08/2022    PHOSPHORUS 3.1 09/17/2021  ALBUMIN 3.9 10/08/2022    TOTALPROTEIN 6.7 10/08/2022    ALKPHOS 87 10/08/2022    AST 27 10/08/2022    ALT 42 10/08/2022    GFR >90 10/08/2022       ASSESSMENT:    ICD-10-CM    1. Low back pain, unspecified back pain laterality, unspecified chronicity, unspecified whether sciatica present  M54.50 20553 - INJ SINGLE OR MULTIPLE TRIGGER POINTS, 3 OR MORE MUSCLE -No Imaging; 44010 Prior Auth/Scheduling Procedure     20553 - INJ SINGLE OR MULTIPLE TRIGGER POINTS, 3 OR MORE MUSCLE (AMB ONLY)     Referral to Spine Center      2.  Myalgia  M79.10 20553 - INJ SINGLE OR MULTIPLE TRIGGER POINTS, 3 OR MORE MUSCLE -No Imaging; 27253 Prior Auth/Scheduling Procedure     20553 - INJ SINGLE OR MULTIPLE TRIGGER POINTS, 3 OR MORE MUSCLE (AMB ONLY)     Referral to Spine Center      3. Lumbar spondylosis  M47.816 20553 - INJ SINGLE OR MULTIPLE TRIGGER POINTS, 3 OR MORE MUSCLE -No Imaging; [id number] Prior Auth/Scheduling Procedure     20553 - INJ SINGLE OR MULTIPLE TRIGGER POINTS, 3 OR MORE MUSCLE (AMB ONLY)     Referral to Spine Center      4. Lumbar stenosis  M48.061 20553 - INJ SINGLE OR MULTIPLE TRIGGER POINTS, 3 OR MORE MUSCLE -No Imaging; [id number] Prior Auth/Scheduling Procedure     20553 - INJ SINGLE OR MULTIPLE TRIGGER POINTS, 3 OR MORE MUSCLE (AMB ONLY)     Referral to Spine Center          PLAN:  Orders Placed This Encounter    20553 - INJ SINGLE OR MULTIPLE TRIGGER POINTS, 3 OR MORE MUSCLE (AMB ONLY)    59563 - INJ SINGLE OR MULTIPLE TRIGGER POINTS, 3 OR MORE MUSCLE -No Imaging; [id number] Prior Auth/Scheduling Procedure    Referral to Spine Center     - Lumbar X-Ray reviewed with patient today. Answered all questions.    - Continue Tylenol and Voltaren prescribed by other clinician.   - TPIs with Dr. Shawnie Pons.   - Referral to Spine Center for further evaluation.   - F/U after Spine center and injection.   - Discussed SCS, patient would like to hold off on this at this time.  - Consider LESI in future.     31 minutes of medical discussion and decision making spent with the patient via telemedicine and chart review.  Our impression, treatment recommendations, and plan from today's visit were reviewed in detail with the patient. All of the patient's questions were answered and wishes to move forward with above noted plan. Patient informed of red flags symptoms like caudal anesthesia, bowel, bladder incontinence including the inability to urinate, weakness of legs that they need to go to the ED emergently for possible impingement of the spinal nerve roots and may  need urgent decompression. Patient confirmed understanding.   The patient was seen independently with a physician available via phone if needed.    [name], FNP-C, 04/29/2023

## 2023-04-30 ENCOUNTER — Ambulatory Visit

## 2023-05-11 ENCOUNTER — Other Ambulatory Visit (INDEPENDENT_AMBULATORY_CARE_PROVIDER_SITE_OTHER): Payer: Self-pay

## 2023-05-11 DIAGNOSIS — M47816 Spondylosis without myelopathy or radiculopathy, lumbar region: Secondary | ICD-10-CM

## 2023-05-11 DIAGNOSIS — M545 Low back pain, unspecified: Secondary | ICD-10-CM

## 2023-05-11 DIAGNOSIS — M48061 Spinal stenosis, lumbar region without neurogenic claudication: Secondary | ICD-10-CM

## 2023-05-11 DIAGNOSIS — M791 Myalgia, unspecified site: Secondary | ICD-10-CM

## 2023-05-12 ENCOUNTER — Encounter (HOSPITAL_COMMUNITY): Payer: Self-pay

## 2023-05-12 ENCOUNTER — Other Ambulatory Visit (INDEPENDENT_AMBULATORY_CARE_PROVIDER_SITE_OTHER): Payer: Self-pay | Admitting: Neurological Surgery

## 2023-05-12 DIAGNOSIS — M48061 Spinal stenosis, lumbar region without neurogenic claudication: Secondary | ICD-10-CM

## 2023-05-12 DIAGNOSIS — M545 Low back pain, unspecified: Secondary | ICD-10-CM

## 2023-05-12 DIAGNOSIS — M4316 Spondylolisthesis, lumbar region: Secondary | ICD-10-CM

## 2023-05-12 DIAGNOSIS — M5416 Radiculopathy, lumbar region: Secondary | ICD-10-CM

## 2023-05-26 ENCOUNTER — Other Ambulatory Visit: Payer: Self-pay

## 2023-05-26 ENCOUNTER — Encounter (HOSPITAL_BASED_OUTPATIENT_CLINIC_OR_DEPARTMENT_OTHER): Payer: Self-pay | Admitting: Student in an Organized Health Care Education/Training Program

## 2023-05-26 ENCOUNTER — Ambulatory Visit (HOSPITAL_BASED_OUTPATIENT_CLINIC_OR_DEPARTMENT_OTHER)

## 2023-05-26 ENCOUNTER — Ambulatory Visit
Payer: Self-pay | Attending: Student in an Organized Health Care Education/Training Program | Admitting: Student in an Organized Health Care Education/Training Program

## 2023-05-26 VITALS — BP 132/84 | HR 74 | Temp 97.5°F | Ht 69.02 in | Wt 275.4 lb

## 2023-05-26 DIAGNOSIS — R7303 Prediabetes: Secondary | ICD-10-CM | POA: Insufficient documentation

## 2023-05-26 DIAGNOSIS — Z0000 Encounter for general adult medical examination without abnormal findings: Secondary | ICD-10-CM | POA: Insufficient documentation

## 2023-05-26 DIAGNOSIS — E785 Hyperlipidemia, unspecified: Secondary | ICD-10-CM

## 2023-05-26 DIAGNOSIS — Z8639 Personal history of other endocrine, nutritional and metabolic disease: Secondary | ICD-10-CM

## 2023-05-26 DIAGNOSIS — I10 Essential (primary) hypertension: Secondary | ICD-10-CM

## 2023-05-26 DIAGNOSIS — N529 Male erectile dysfunction, unspecified: Secondary | ICD-10-CM | POA: Insufficient documentation

## 2023-05-26 DIAGNOSIS — K6289 Other specified diseases of anus and rectum: Secondary | ICD-10-CM | POA: Insufficient documentation

## 2023-05-26 DIAGNOSIS — Z0184 Encounter for antibody response examination: Secondary | ICD-10-CM

## 2023-05-26 DIAGNOSIS — K219 Gastro-esophageal reflux disease without esophagitis: Secondary | ICD-10-CM | POA: Insufficient documentation

## 2023-05-26 DIAGNOSIS — Z6841 Body Mass Index (BMI) 40.0 and over, adult: Secondary | ICD-10-CM | POA: Insufficient documentation

## 2023-05-26 LAB — COMPREHENSIVE METABOLIC PANEL, NON-FASTING
ALBUMIN: 4.1 g/dL (ref 3.4–4.8)
ALKALINE PHOSPHATASE: 83 U/L (ref 45–115)
ALT (SGPT): 49 U/L — ABNORMAL HIGH (ref <43)
ANION GAP: 10 mmol/L (ref 4–13)
AST (SGOT): 35 U/L — ABNORMAL HIGH (ref 11–34)
BILIRUBIN TOTAL: 0.6 mg/dL (ref 0.3–1.3)
BUN/CREA RATIO: 18 (ref 6–22)
BUN: 14 mg/dL (ref 8–25)
CALCIUM: 9.3 mg/dL (ref 8.6–10.3)
CHLORIDE: 101 mmol/L (ref 96–111)
CO2 TOTAL: 26 mmol/L (ref 23–31)
CREATININE: 0.77 mg/dL (ref 0.75–1.35)
ESTIMATED GFR - MALE: >90 mL/min/BSA (ref >=60)
GLUCOSE: 118 mg/dL (ref 65–125)
POTASSIUM: 4.2 mmol/L (ref 3.7–5.3)
PROTEIN TOTAL: 6.7 g/dL (ref 5.6–7.6)
SODIUM: 137 mmol/L (ref 136–145)

## 2023-05-26 LAB — CBC WITH DIFF
BASOPHIL #: <0.1 10*3/uL (ref <=0.20)
BASOPHIL %: 0.9 %
EOSINOPHIL #: 0.25 10*3/uL (ref <=0.50)
EOSINOPHIL %: 3.3 %
HCT: 44 % (ref 38.9–52.0)
HGB: 14.8 g/dL (ref 13.4–17.5)
IMMATURE GRANULOCYTE #: <0.1 10*3/uL (ref <0.10)
IMMATURE GRANULOCYTE %: 0.7 % (ref 0.0–1.0)
LYMPHOCYTE #: 2.2 10*3/uL (ref 1.00–4.80)
LYMPHOCYTE %: 29.4 %
MCH: 28.2 pg (ref 26.0–32.0)
MCHC: 33.6 g/dL (ref 31.0–35.5)
MCV: 83.8 fL (ref 78.0–100.0)
MONOCYTE #: 0.57 10*3/uL (ref 0.20–1.10)
MONOCYTE %: 7.6 %
MPV: 9.3 fL (ref 8.7–12.5)
NEUTROPHIL #: 4.34 10*3/uL (ref 1.50–7.70)
NEUTROPHIL %: 58.1 %
PLATELETS: 252 10*3/uL (ref 150–400)
RBC: 5.25 10*6/uL (ref 4.50–6.10)
RDW-CV: 13.3 % (ref 11.5–15.5)
WBC: 7.5 10*3/uL (ref 3.7–11.0)

## 2023-05-26 LAB — IRON TRANSFERRIN AND TIBC
IRON (TRANSFERRIN) SATURATION: 26 % (ref 15–50)
IRON: 92 ug/dL (ref 55–175)
TOTAL IRON BINDING CAPACITY: 354 ug/dL (ref 224–476)
TRANSFERRIN: 253 mg/dL (ref 160–340)

## 2023-05-26 LAB — HGA1C (HEMOGLOBIN A1C WITH EST AVG GLUCOSE)
ESTIMATED AVERAGE GLUCOSE: 140 mg/dL
HEMOGLOBIN A1C: 6.5 % — ABNORMAL HIGH (ref 4.0–5.6)

## 2023-05-26 MED ORDER — OMEPRAZOLE 20 MG TABLET,DELAYED RELEASE
20.0000 mg | DELAYED_RELEASE_TABLET | ORAL | Status: AC
Start: 2023-05-26 — End: 2023-06-09

## 2023-05-26 MED ORDER — SILDENAFIL 100 MG TABLET
100.0000 mg | ORAL_TABLET | ORAL | 2 refills | Status: AC | PRN
Start: 2023-05-26 — End: ?

## 2023-05-26 MED ORDER — FAMOTIDINE 40 MG TABLET
40.0000 mg | ORAL_TABLET | Freq: Two times a day (BID) | ORAL | 3 refills | Status: AC
Start: 2023-05-26 — End: ?

## 2023-05-26 NOTE — Progress Notes (Signed)
 FAMILY MEDICINE, [place] [address]  [address]  [place] [address]  Operated by [facility]  Medicare Annual Wellness Visit    Name: Henry [name] MRN:  [id number]   Date: 05/26/2023 Age: 68 y.o.       SUBJECTIVE:   Henry [name] is a 68 y.o. male for presenting for Medicare Wellness exam.   I have reviewed and reconciled the medication list with the patient today.        05/26/2023    11:26 AM 12/29/2021    10:58 AM   Comprehensive Health Assessment-Adult   Do you wish to complete this form? Yes Yes   During the past 4 weeks, how would you rate your health in general? Good Good   During the past 4 weeks, how much difficulty have you had doing your usual activities inside and outside your home because of medical or emotional problems? No difficulty at all A little bit of difficulty       arthritis and left hip pain worse later in the day   During the past 4 weeks, was someone available to help you if you needed and wanted help? Yes, as much as I wanted Yes, as much as I wanted   In the past year, how many times have you gone to the emergency department or been admitted to a hospital for a health problem? 1 time 1 time   Are you generally satisfied with your sleep? Yes Yes   Do you have enough money to buy things you need in everyday life, such as food, clothing, medicines, and housing? Yes, always Yes, always   Can you get to places beyond walking distance without help?  (For example, can you drive your own car or travel alone on buses)? Yes Yes   Do you fasten your seatbelt when you are in a car? Yes, usually Yes, usually   Do you exercise 20 minutes 3 or more days per week (such as walking, dancing, biking, mowing grass, swimming)? Yes, most of the time Yes, most of the time       almost 2 hours at [facility] 3 days week   How often do you eat food that is healthy (fruits, vegetables, lean meats) instead of unhealthy (sweets, fast food, junk food, fatty foods)?  Almost always Almost always   Have your parents, brothers or sisters had any of the following problems before the age of 74? (check all that apply) Diabetes (sugar);Cancer;Heart problems, or hardening of the arteries;High cholesterol;Other family illness Diabetes (sugar);Heart problems, or hardening of the arteries;High cholesterol;Cancer;Other family illness   How often do you have trouble taking medicines the eay you are told to take them? I always take them as prescribed I always take them as prescribed   Do you need any help communicating with your doctors and nurses because of vision or hearing problems? No No   During the past 12 months, have you experienced confusion or memory loss that is happening more often or is getting worse? No No   Do you have one person you think of as your personal doctor (primary care provider or family doctor)? Yes Yes   If you are seeing a Primary Care Provider (PCP) or family doctor. please list their name Dr. Freida Busman Jaclin Finks Dr. Freida Busman Jerrell Mangel   Are you now also seeing any specialist physician(s) (such as eye doctor, foot doctor, skin doctor)? Yes Yes   If you are seeing a specialist for anything  such as foot, eye, skin, etc.  please list their name(s) see list see list   How confident are you that you can control or manage most of your health problems? Very confident Very confident       I have reviewed and updated as appropriate the past medical, family and social history. 05/26/2023 as summarized below:  Past Medical History:   Diagnosis Date    Arthritis     Back problem     Cancer (CMS HCC) 11/2015    prostate cancer, melanoma back neck    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    Chronic pain     CPAP (continuous positive airway pressure) dependence     CPAP 9 cm H2O    CPAP (continuous positive airway pressure) dependence     Deep vein thrombosis (DVT)     left leg    Eczema     GERD (gastroesophageal reflux disease)     well controlled on  omeprazole    Heartburn     HTN     Hyperlipidemia     Hypertension     Irritable bowel syndrome     Melanoma     Morbid obesity with BMI of 40.0-44.9, adult (CMS HCC) 09/16/2021    Neck problem     obstructive sleep apnea 01/2010    AHI 53.8    Osteoarthritis of back     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    Pb colonoscopy,diagnostic       Current Outpatient Medications   Medication Sig    atorvastatin (LIPITOR) 40 mg Oral Tablet Take 1 Tablet (40 mg total) by mouth Once a day    diclofenac sodium (VOLTAREN) 75 mg Oral Tablet, Delayed Release (E.C.) Take 1 Tablet (75 mg total) by mouth Twice daily    dicyclomine (BENTYL) 20 mg Oral Tablet Take 1 Tablet (20 mg total) by mouth Four times a day    famotidine (PEPCID) 40 mg Oral Tablet Take 1 Tablet (40 mg total) by mouth Twice daily    GLUCOS CHOND CPLX ADVANCED ORAL Take 2 Tabs by mouth Once a day Glucosamine-Chondroitin 1500 mg    hydroCHLOROthiazide (HYDRODIURIL) 25 mg Oral Tablet Take 1 Tablet (25 mg total) by mouth Once a day    losartan (COZAAR) 100 mg Oral Tablet Take 1 Tablet (100 mg total) by mouth Once a day    metoprolol tartrate (LOPRESSOR) 50 mg Oral Tablet Take 1 Tablet (50 mg total) by mouth Twice daily    metroNIDAZOLE 1 % Gel with Pump by Apply externally route Once a day    multivitamin Oral Tablet Take 1 Tablet by mouth Daily    Omeprazole 20 mg Oral Tablet, Delayed Release (E.C.) Take 1 Tablet (20 mg total) by mouth Every other day for 14 days am    Sildenafil (VIAGRA) 100 mg Oral Tablet Take 1 Tablet (100 mg total) by mouth Every 24 hours as needed    triamcinolone acetonide 0.1 % Cream      Family Medical History:  Problem Relation (Age of Onset)    Asthma Father    Atherosclerosis Paternal  Grandmother    Breast Cancer Maternal Grandmother    COPD Father    Cancer Mother, Father    Coronary Artery Disease Mother    Diabetes Mother    High Cholesterol Mother    Hypertension (High Blood Pressure) Mother    Melanoma Brother    Migraines Mother    Other Maternal Grandfather    Prostate Cancer Paternal Grandfather    Stomach Cancer Maternal Grandmother            Social History     Socioeconomic History    Marital status: Married    Number of children: 0   Occupational History     Employer: [employer]    Occupation: retired     Comment: formerly worked administration in [employer]   Tobacco Use    Smoking status: Former     Types: Cigars     Quit date: 2012     Years since quitting: 13.2    Smokeless tobacco: Never    Tobacco comments:     quit cigars in 2012   Vaping Use    Vaping status: Never Used   Substance and Sexual Activity    Alcohol use: Yes     Alcohol/week: 2.0 standard drinks of alcohol     Types: 2 Cans of beer per week     Comment: per week    Drug use: No     Comment: capsule     Social Determinants of Health     Social Connections: Low Risk  (06/15/2022)    Social Connections     SDOH Social Isolation: 5 or more times a week   Health Literacy: Low Risk  (06/15/2022)    Health Literacy     SDOH Health Literacy: Never         List of Current Health Care Providers   Care Team       PCP       Name Type Specialty Phone Number    Marchelle Gearing, MD Physician FAMILY MEDICINE [phone]              Care Team       Name Type Specialty Phone Number    Rosaura Carpenter [facility] Nurse Practitioner FAMILY NURSE PRACTITIONER [phone]    Marta Antu, MD Physician PAIN MANAGEMENT [phone]    Billie Lade, MD Physician [facility] SURGERY [phone]                      Health Maintenance   Topic Date Due    RSV Adult 60+ or Pregnancy (1 - Risk 60-74 years 1-dose series) Never done    Depression Screening  05/25/2024    Medicare Annual Wellness Visit  05/25/2024    Prostate Cancer  Screening  10/07/2024    Colonoscopy  04/15/2028    Adult Tdap-Td (3 - Td or Tdap) 06/12/2030    Hepatitis C screening  Completed    AAA Screening  Completed    Influenza Vaccine  Completed    Shingles Vaccine  Completed    Pneumococcal Vaccination, Age 66+  Completed    Covid-19 Vaccine  Discontinued     Medicare Wellness Assessment   Medicare initial or wellness physical in the last year?: No  Advance Directives   Does patient have a living will or MPOA: Yes   Has patient provided [employer] with a copy?:  Yes              Activities of Daily Living   Do you need help with dressing, bathing, or walking?: No (lot of back issues but still manages)   Do you need help with shopping, housekeeping, medications, or finances?: No   Do you have rugs in hallways, broken steps, or poor lighting?: No   Do you have grab bars in your bathroom, non-slip strips in your tub, and hand rails on your stairs?: Yes (no grab bars)   Cognitive Function Screen (1=Yes, 0=No)   What is you age?: Correct   What is the time to the nearest hour?: Correct   What is the year?: Correct   What is the name of this clinic?: Correct   Can the patient recognize two persons (the doctor, the nurse, home help, etc.)?: Correct   What is the date of your birth? (day and month sufficient) : Correct   In what year did World War II end?: Correct   Who is the current president of the [place]?: Correct   Count from 20 down to 1?: Correct   What address did I give you earlier?: Correct   Total Score: 10   Interpretation of Total Score: Greater than 6 Normal   Fall Risk Screen   Do you feel unsteady when standing or walking?: Yes  Do you worry about falling?: No  Have you fallen in the past year?: No  Timed up and go test (in seconds): 8   Depression Screen     Little interest or pleasure in doing things.: Not at all  Feeling down, depressed, or hopeless: Not at all  PHQ 2 Total: 0     Pain Score   Pain Score:   4 (lower back stenosise and DJD; joints)     Substance Use-Abuse Screening     Tobacco Use     In Past 12 MONTHS, how often have you used any tobacco product (for example, cigarettes, e-cigarettes, cigars, pipes, or smokeless tobacco)?: Never     Alcohol use     In the PAST 12 MONTHS, how often have you had 5 (men)/4 (women) or more drinks containing alcohol in one day?: Never     Prescription Drug Use     In the PAST 12 months, how often have you used any prescription medications just for the feeling, more than prescribed, or that were not prescribed for you? Prescriptions may include: opioids, benzodiazepines, medications for ADHD: Never           Illicit Drug Use   In the PAST 12 MONTHS, how often have you used any drugs, including marijuana, cocaine or crack, heroin, methamphetamine, hallucinogens, ecstasy/MDMA?: Never            Urine Incontinence Screen   Urinary Incontinence Screen  Do you ever leak urine when you don't want to?: YES (occassionally hx of Prostate CA)                      OBJECTIVE:   BP 132/84   Pulse 74   Temp 36.4 C (97.5 F) (Thermal Scan)   Ht 1.753 m (5' 9.02")   Wt 125 kg (275 lb 5.7 oz)   SpO2 97%   BMI 40.64 kg/m        Other appropriate exam:    Health Maintenance Due   Topic Date Due    RSV Adult 60+ or Pregnancy (1 - Risk 60-74 years  1-dose series) Never done      ASSESSMENT & PLAN:   Assessment/Plan   1. Medicare annual wellness visit, subsequent    2. Body mass index (BMI) 40.0-44.9, adult (CMS HCC)    3. Hypertension    4. Hyperlipemia    5. Prediabetes    6. History of iron deficiency    7. Erectile dysfunction    8. Immunity status testing    9. Gastroesophageal reflux disease, unspecified whether esophagitis present    10. Rectal mass       Identified Risk Factors/ Recommended Actions   (Z00.00) Medicare annual wellness visit, subsequent  (primary encounter diagnosis)  Plan: See Below    (Z68.41) Body mass index (BMI) 40.0-44.9, adult (CMS HCC)  Plan: Patient continues to modify his lifestyle by exercising  about 2 hours for 3 days/wk and eating healthy.  Continue to monitor.  Follow up with PCP.    (I10) Hypertension  Plan: COMPREHENSIVE METABOLIC PANEL, NON-FASTING        Continue taking home medications as prescribed. Continue to monitor.  Follow up with PCP.  BP Readings from Last 4 Encounters:   05/26/23 132/84   03/17/23 (!) 157/82   02/25/23 133/67   02/24/23 (!) 153/76     (E78.5) Hyperlipemia  Plan: COMPREHENSIVE METABOLIC PANEL, NON-FASTING        Continue taking home medications as prescribed. Continue to monitor.  Follow up with PCP.    Lab Results   Component Value Date    CHOLESTEROL 136 10/08/2022    HDLCHOL 35 (L) 10/08/2022    LDLCHOL 64 10/08/2022    LDLCHOLDIR 65 07/04/2013    TRIG 228 (H) 10/08/2022      (R73.03) Prediabetes  Plan: COMPREHENSIVE METABOLIC PANEL, NON-FASTING,         HGA1C (HEMOGLOBIN A1C WITH EST AVG GLUCOSE)        Continue to monitor.  Follow up with PCP.  Lab Results   Component Value Date    HA1C 6.3 (H) 10/08/2022     (Z86.39) History of iron deficiency  Plan: CBC/DIFF, IRON TRANSFERRIN AND TIBC        Continue taking home medications as prescribed. Continue to monitor.  Follow up with PCP.    (N52.9) Erectile dysfunction  Plan: Sildenafil (VIAGRA) 100 mg Oral Tablet        Continue taking home medications as prescribed. Continue to monitor.  Follow up with PCP.    (Z01.84) Immunity status testing  Plan: MEASLES IGG    (K21.9) Gastroesophageal reflux disease, unspecified whether esophagitis present  Plan: famotidine (PEPCID) 40 mg Oral Tablet,         Omeprazole 20 mg Oral Tablet, Delayed Release         (E.C.)        Continue taking home medications as prescribed. Continue to monitor.  Follow up with PCP.    Fall Risk Follow up plan of care: Discussed optimizing home safety  The PHQ 2 Total: 0 depression screen is interpreted as negative.    Urinary Incontinence Plan of Care: Behavioral interventions with bladder training, pelvic floor muscle training, and/or prompted  voiding    Orders Placed This Encounter    CBC/DIFF    IRON TRANSFERRIN AND TIBC    MEASLES IGG    COMPREHENSIVE METABOLIC PANEL, NON-FASTING    HGA1C (HEMOGLOBIN A1C WITH EST AVG GLUCOSE)    Refer to [facility] Colorectal Surgery    Sildenafil (VIAGRA) 100 mg Oral Tablet  famotidine (PEPCID) 40 mg Oral Tablet    Omeprazole 20 mg Oral Tablet, Delayed Release (E.C.)        The patient has been educated about risk factors and recommended preventive care. Written Prevention Plan completed/ updated and given to patient (see After Visit Summary).    Return in about 4 months (around 09/25/2023), or if symptoms worsen or fail to improve.    A shared visit was performed with the co-signing physician.  Renda Rolls, RN 05/26/2023 15:25    I personally met face-to-face with the patient today. See wellness nurse's note for additional details. My participation in the visit: I reviewed and discussed the prevention plan with the patient    Marchelle Gearing, MD

## 2023-05-26 NOTE — Progress Notes (Signed)
 FAMILY MEDICINE, [place] [address]  [address]  [place] [address]  Operated by [facility]  Return Patient Visit    Name: Henry [name] MRN:  [id number]   Date: 05/26/2023 DOB:  10-Feb-1956 (68 y.o.)   PCP: Marchelle Gearing, MD    Chief Complaint   Patient presents with    Medicare Annual    Hypertension    Growth(s)     "At the top of anus for the past two months."     Assessment & Plan    Henry [name] is a 68 y.o. male who was seen today for:  (Z00.00) Medicare annual wellness visit, subsequent  (primary encounter diagnosis)  Plan: See separate medicare wellness note    (Z68.41) Body mass index (BMI) 40.0-44.9, adult (CMS HCC)  Plan: Chronic, stable  -Encourage exercise and physical activity    (I10) Hypertension  Plan: COMPREHENSIVE METABOLIC PANEL, NON-FASTING        Chronic, controlled  -Continue losartan 100 mg daily and HCTZ 25 mg daily     (E78.5) Hyperlipemia  Plan: COMPREHENSIVE METABOLIC PANEL, NON-FASTING  Chronic, stable  -Continue Lipitor 40 mg daily     (R73.03) Prediabetes  Plan: COMPREHENSIVE METABOLIC PANEL, NON-FASTING,         HGA1C (HEMOGLOBIN A1C WITH EST AVG GLUCOSE)       Chronic, stable  -Recheck A1C    (Z86.39) History of iron deficiency  Plan: CBC/DIFF, IRON TRANSFERRIN AND TIBC    (N52.9) Erectile dysfunction  Plan: Sildenafil (VIAGRA) 100 mg Oral Tablet  -Chronic, not controlled  -Increase Viagra to 100 mg daily prn    (Z01.84) Immunity status testing  Plan: MEASLES IGG    (K21.9) Gastroesophageal reflux disease, unspecified whether esophagitis present  Plan: famotidine (PEPCID) 40 mg Oral Tablet,         Omeprazole 20 mg Oral Tablet, Delayed Release         (E.C.)  Chronic, controlled  -Will attempt to titrate off Prilosec to pepcid    (K62.89) Rectal mass  Plan: Refer to [facility] Colorectal Surgery  -subacute  -DDx: hemorrhoid, anal fissure, malignancy  -Refer to colorectal for anoscopy         Subjective   Henry [name] is a 68 y.o.  male who presents to clinic for return monitoring.     Acute concern of rectal mass. Noted it about 2 months ago. History of hemorrhoids but feels different. Not pain, itching or bleeding. On the superior aspect of the rectum.    HTN: Compliant with meds. Not checking BP consistently at home. When checked outpatient has been controlled. No SE of meds.    HLD: Compliant with meds.     GERD: Would like to wean off of Prilosec after reading about long term effects. No current symptoms. No SE. No weight loss or dysphagia.     ED: Previously prescribed Viagra by urology. Had some effect at 50 mg dosage. Previously hx of prostate ca.     Objective   BP 132/84   Pulse 74   Temp 36.4 C (97.5 F) (Thermal Scan)   Ht 1.753 m (5' 9.02")   Wt 125 kg (275 lb 5.7 oz)   SpO2 97%   BMI 40.64 kg/m     General: Alert, In no acute distress    HENT: Normocephalic, Atraumatic  Pulmonary: Normal respiratory effort, Clear to auscultation bilaterally  Cardiovascular: Well Perfused, RRR, No murmur appreciated  Abdominal/Gastrointestinal: Non-distended  Rectal: Chaperoned  by Lindy Hoffa. No external abnormalities. Rough area palpable on the superior aspect of the rectum. No obvious hemorrhoids. No blood.   Extremities: No cyanosis, No edema bilaterally  Integumentary: Warm and dry, No rash  Neurologic: Alert and oriented, CN II-XII grossly in tact, Normal speech  Psychiatric: Normal affect and behavior     Return in about 4 months (around 09/25/2023), or if symptoms worsen or fail to improve.    Marchelle Gearing, MD

## 2023-05-26 NOTE — Patient Instructions (Addendum)
 Medicare Preventive Services  Medicare coverage information Recommendation for YOU   Heart Disease and Diabetes   Lipid profile every 5 years or more often if at risk for cardiovascular disease  Lab Results   Component Value Date    CHOLESTEROL 136 10/08/2022    HDLCHOL 35 (L) 10/08/2022    LDLCHOL 64 10/08/2022    LDLCHOLDIR 65 07/04/2013    TRIG 228 (H) 10/08/2022       Diabetes Screening with Blood Glucose test or Glucose Tolerance Test Yearly for those at risk for diabetes, up to two tests per year for those with prediabetes  Last Glucose:       Diabetes Self-Management Training   Initial training ten hours per year, and follow-up training two hours per subsequent year. Optional for those with diabetes    Medical Nutrition Therapy   Three hours of one-on-one counseling in first year, two hours in subsequent years. Optional for those with diabetes, kidney disease   Intensive Behavioral Therapy for Obesity  Face-to-face counseling, first month every week, month 2-6 every other week, month 7-12 every month if continued progress is documented Optional for those with Body Mass Index 30 or higher  Your Body mass index is 40.64 kg/m.   Tobacco Cessation (Quitting) Counseling   Two attempts per year, max 4 sessions per attempt, up to 8 sessions per year Optional for those who use tobacco    Cancer Screening Last Completion Date   Colorectal screening   For anyone age 78 to 63 or any age if high risk:  Screening Colonoscopy every 10 yrs if low risk,  more frequent if higher risk  OR  Cologuard Stool DNA test once every 3 years OR  Fecal Occult Blood Testing yearly OR  Flexible  Sigmoidoscopy  every 5 yr OR  CT Colonography every 5 yrs  --04/15/2018 Colonoscopy completed, repeat in 10 years.  See below for due date if applicable.   Prostate Cancer Screening  Prostate Specific Antigen blood test based on joint decision making with your provider for ages 63-69  A joint decision between you and your primary care provider    Lung Cancer Screening  Annual low dose computed tomography (LDCT scan) is recommended for those age 78-80 who smoked 20 pack-years and are current smokers or quit smoking within past 15 years, after counseling by your doctor or nurse clinician about the possible benefits or harms.   See below for due date if applicable.   Vaccinations   Respiratory syncytial virus (RSV)  Age 63 years or older: Based on shared clinical decision-making with your provider.  Pneumococcal Vaccine  Recommended routinely age 55+ with one or two separate vaccines based on your risk. Recommended before age 15 if medical conditions with increased risk  Seasonal Influenza Vaccine  Once every flu season   Hepatitis B Vaccine  3 doses if risk (including anyone with diabetes or liver disease)  Shingles Vaccine  Two doses at age 76 or older  Diphtheria Tetanus Pertussis Vaccine  ONCE as adult, booster every 10 years     Immunization History   Administered Date(s) Administered    Covid-19 Vaccine,Pfizer Bivalent,38mcg/0.3ml,12 yrs+ 11/15/2020    Covid-19 Vaccine,Pfizer-BioNTech,Gray Top,15yrs+ 06/27/2020    Covid-19 Vaccine,Pfizer-BioNTech,Purple Top,83yrs+ 04/18/2019, 05/06/2019, 12/09/2019    Diptheria & Tetanus Toxoid,Adsorbed, 48YRS & OLDER 04/15/1999    Diptheria/Tetanus,Adsorbed (ADMIN) 01/30/2010    INFLUENZA VIRUS VACCINE (ADMIN) 01/23/2002, 01/02/2006, 10/30/2010    Influenza Vaccine, 6 month-adult 01/17/2014, 12/18/2014, 11/24/2016, 12/21/2017, 11/21/2019  Influenza Vaccine, 65+ 11/15/2020, 11/20/2021, 11/24/2022    Pneumovax 06/11/2020    Shingrix - Zoster Vaccine 08/27/2016, 10/19/2017    Tetanus Toxoid/Diphtheria Toxoid/Acellular Pertussis Vaccine, Adsorbed 06/11/2020    VAXNEUVANCE 11/20/2021     RSV vaccine is available at pharmacies or local health department without a prescription.   Other Preventative Screening  Last Completion Date   Glaucoma Screening   Yearly if in high risk group such as diabetes, family history, African  American age 59+ or Hispanic American age 18+ See your Eye Care Provider   Hepatitis C Screening   Recommended  for those born between ages 18-79 years. --09/10/2016  See below for due date if applicable.     HIV Testing  Recommended routinely at least ONCE, covered every year for age 49 to 7 regardless of risk, and every year for age over 64 who ask for the test or higher risk. Yearly or up to 3 times in pregnancy    See below for due date if applicable.  Bone Densitometry   Screening is recommended for Men ages 8 and above with one or more risk factor: androgen deprivation therapy for prostate cancer, hypogonadism, frailty, primary hyperparathyroidism, hyperthyroidism  For men diagnosed with osteoporosis, follow up is recommended every years or a frequency recommended by your provider     See below for due date if applicable.   Abdominal Aortic Aneurysm Screening Ultrasound   Once with a family history of abdominal aortic aneurysms OR a male between 27-75 and have smoked at least 100 cigarettes in your lifetime.   --10/22/2020  See below for due date if applicable.       Your Personalized Schedule for Preventive Tests     Health Maintenance: Pending and Last Completed         Date Due Completion Date    RSV Adult 60+ or Pregnancy (1 - Risk 60-74 years 1-dose series) Never done ---    Medicare Annual Wellness Visit 12/30/2022 12/29/2021    Depression Screening 05/25/2024 05/26/2023    Prostate Cancer Screening 10/07/2024 10/08/2022    Colonoscopy 04/15/2028 04/15/2018    Override on 03/04/2006: Done    Adult Tdap-Td (3 - Td or Tdap) 06/12/2030 06/11/2020    Override on 01/30/2010: Done (Had done here at family medicine)                For Information on Advanced Directives for Health Care:  [place]:  [email or web address]  [place], [place], [place], [place] General Information: [email or web address]     Will see you next year for Annual Wellness  Visit!  It was nice meeting you!  Renda Rolls RN

## 2023-05-27 LAB — MEASLES IGG: MEASLES IGG QUALITATIVE: POSITIVE

## 2023-05-28 ENCOUNTER — Other Ambulatory Visit (HOSPITAL_BASED_OUTPATIENT_CLINIC_OR_DEPARTMENT_OTHER): Payer: Self-pay | Admitting: Student in an Organized Health Care Education/Training Program

## 2023-05-28 ENCOUNTER — Ambulatory Visit (HOSPITAL_COMMUNITY): Payer: Self-pay | Admitting: Student in an Organized Health Care Education/Training Program

## 2023-05-28 DIAGNOSIS — R7303 Prediabetes: Secondary | ICD-10-CM

## 2023-05-28 DIAGNOSIS — I10 Essential (primary) hypertension: Secondary | ICD-10-CM

## 2023-06-01 ENCOUNTER — Ambulatory Visit (INDEPENDENT_AMBULATORY_CARE_PROVIDER_SITE_OTHER): Payer: Self-pay | Admitting: Neurological Surgery

## 2023-06-02 ENCOUNTER — Encounter (INDEPENDENT_AMBULATORY_CARE_PROVIDER_SITE_OTHER): Payer: Self-pay

## 2023-06-02 ENCOUNTER — Encounter (HOSPITAL_BASED_OUTPATIENT_CLINIC_OR_DEPARTMENT_OTHER): Payer: Self-pay

## 2023-06-03 ENCOUNTER — Ambulatory Visit (INDEPENDENT_AMBULATORY_CARE_PROVIDER_SITE_OTHER): Payer: Self-pay

## 2023-06-07 ENCOUNTER — Encounter (HOSPITAL_BASED_OUTPATIENT_CLINIC_OR_DEPARTMENT_OTHER): Payer: Self-pay

## 2023-06-07 NOTE — H&P (Signed)
 COLORECTAL SURGERY HISTORY and PHYSICAL    Patient: Henry [name]  D.O.B.: 06/22/1955  MRN# [id number]  Date of Service: 06/07/2023    Referring Provider: Enrique Harvest, MD      Chief Complaint: Rectal Mass     HPI  Henry [name] is a 68 y.o. male presents for evaluation of of perianal mass he has felt for the last several months but does not feel like it is getting any bigger. He has a history of prostate cancer with radiation in 2017. He does have a history of hemorrhoids and when applying over the counter hemorrhoid cream he says he felt what appeared to be a mass. He was referred to me for further evaluation.           Allergies:  Allergies   Allergen Reactions    Sulfa (Sulfonamides) Rash    Shellfish Containing Products Itching and Swelling     Shrimp - eye swelling, itching       Current Outpatient Prescriptions  No outpatient medications have been marked as taking for the 06/10/23 encounter (Appointment) with Doshie Maggi, Ernestina Headland, MD.        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    Chronic pain     CPAP (continuous positive airway pressure) dependence     CPAP 9 cm H2O    CPAP (continuous positive airway pressure) dependence     Deep vein thrombosis (DVT)     left leg    Eczema     GERD (gastroesophageal reflux disease)     well controlled on omeprazole     Heartburn     HTN     Hyperlipidemia     Hypertension     Irritable bowel syndrome     Melanoma     Morbid obesity with BMI of 40.0-44.9, adult (CMS HCC) 09/16/2021    Neck problem     obstructive sleep apnea 01/2010    AHI 53.8    Osteoarthritis of back     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           Past Surgical History:    Past Surgical History:   Procedure Laterality Date    GALLBLADDER  SURGERY      HX ANKLE FRACTURE TX Right     HX APPENDECTOMY      HX ERCP      HX HIP REPLACEMENT Right 05/22/2013    Per Dr. Rodolfo Clan    PB COLONOSCOPY,DIAGNOSTIC             Family History:  Family Medical History:       Problem Relation (Age of Onset)    Asthma Father    Atherosclerosis Paternal Grandmother    Breast Cancer Maternal Grandmother    COPD Father    Cancer Mother, Father    Coronary Artery Disease Mother    Diabetes Mother    High Cholesterol Mother    Hypertension (High Blood Pressure) Mother    Melanoma Brother    Migraines Mother    Other Maternal Grandfather    Prostate Cancer Paternal Grandfather  Stomach Cancer Maternal Grandmother              Social History:    Social History     Socioeconomic History    Marital status: Married     Spouse name: Not on file    Number of children: 0    Years of education: Not on file    Highest education level: Not on file   Occupational History     Employer: [employer]    Occupation: retired     Comment: formerly worked administration in [employer]   Tobacco Use    Smoking status: Former     Types: Cigars     Quit date: 2012     Years since quitting: 13.3    Smokeless tobacco: Never    Tobacco comments:     quit cigars in 2012   Vaping Use    Vaping status: Never Used   Substance and Sexual Activity    Alcohol  use: Yes     Alcohol /week: 2.0 standard drinks of alcohol      Types: 2 Cans of beer per week     Comment: per week    Drug use: No     Comment: capsule    Sexual activity: Not on file   Other Topics Concern    Abuse/Domestic Violence No    Caffeine Concern No    Calcium intake adequate Yes    Computer Use Yes    Drives Yes    Exercise Concern No    Helmet Use Not Asked    Seat Belt Yes    Special Diet No    Sunscreen used Yes    Uses Cane Not Asked    Uses walker Not Asked    Uses wheelchair Not Asked    Right hand dominant Yes    Left hand dominant Not Asked    Ambidextrous Not Asked    Shift Work Not Asked    Unusual Sleep-Wake Schedule Not Asked    Ability  to Walk 1 Flight of Steps without SOB/CP Yes    Routine Exercise Yes     Comment: [employer], 3-4 times a week, 2hrs.    Ability to Walk 2 Flight of Steps without SOB/CP Yes    Unable to Ambulate Not Asked    Total Care Not Asked    Ability To Do Own ADL's Yes    Uses Walker Not Asked    Other Activity Level Yes     Comment: 14 steps at home. walks a lot. shoveled snow this winter. limited by pain.    Uses Cane Not Asked   Social History Narrative    Not on file     Social Determinants of Health     Financial Resource Strain: Not on file   Transportation Needs: Not on file   Social Connections: Low Risk  (06/15/2022)    Social Connections     SDOH Social Isolation: 5 or more times a week   Intimate Partner Violence: Not on file   Housing Stability: Not on file         Objective:  BP (!) 150/78   Pulse 69   Temp 36.7 C (98.1 F)   Ht 1.753 m (5\' 9" )   Wt 124 kg (273 lb 5.9 oz)   SpO2 96%   BMI 40.37 kg/m         Physical Exam:  Constitutional: Pleasant, AAOx3, NAD  HEENT: Normocephalic, atraumatic. No scleral icterus.  Neck: Trachea midline,  supple.   Cardiovascular: RRR; No audible murmurs.  Pulmonary: Normal respiratory effort. No retractions.  No wheezes or stridor.  Abdomen: Abdomen soft and supple; No tenderness. Non-distended; No masses or HSM present.  Extremities: WWP.  Musculoskeletal: Normal ROM in all four extremities.   Skin: No rashes. Warm and dry.  Psychiatric: Normal mood and affect; Judgement and thought content normal.  Anorectal: external hemorrhoids. Anoscopy with small skin tag right posterior that does not appear to be malignant. Circumferential internal hemorrhoids.    Procedure Note  Type of Procedure: Anoscopy  Procedure Date: 06/10/2023    Performing Provider: Genevieve Kerry. Tova Vater, MD  Clinical Indications: rectal mass    Description of Procedure: After verbal consent was obtained, the patient was placed in the prone position on the proctology table.  After performing an external visual  examination of the perianal area and performing a digital rectal examination, a well-lubricated disposable lighted anoscope was gently inserted through the anal sphincter complex and advanced to the distal rectum easily. A 360 degree visual examination of the anal canal mucosa was performed. The mucosa was examined and there was circumferential internal hemorrhoids and small right posterior skin tag.      The patient tolerated the procedure well.  I performed the procedure.    Specimens sent: none  Estimated Blood Loss: none        Assessment:  Janard Culp is a 68 y.o. male with concern for a rectal mass found to have a right posterior skin tag which does not appear to be malignant    Plan:  We discussed options moving forward. I do not feel this is a malignant process but we discussed excision for definitive pathology/management. We also discussed returning for re-evaluation in 3 months.  He would like to return in three months to discuss further.      On the day of the encounter, a total of 45 minutes was spent on this patient encounter including review of historical information, examination, documentation and post-visit activities, >50% of which was spent directly counseling the patient regarding treatment options and coordinating care.     Jacqueline Matsu, MD  Colon and Rectal Surgery

## 2023-06-10 ENCOUNTER — Ambulatory Visit: Payer: Self-pay | Attending: GENERAL SURGERY | Admitting: Orthopaedic Surgery

## 2023-06-10 ENCOUNTER — Telehealth (INDEPENDENT_AMBULATORY_CARE_PROVIDER_SITE_OTHER): Payer: Self-pay | Admitting: Neurological Surgery

## 2023-06-10 ENCOUNTER — Ambulatory Visit (HOSPITAL_BASED_OUTPATIENT_CLINIC_OR_DEPARTMENT_OTHER)
Admission: RE | Admit: 2023-06-10 | Discharge: 2023-06-10 | Disposition: A | Discharge status: Home / Self Care | Source: Ambulatory Visit | Admitting: Radiology

## 2023-06-10 ENCOUNTER — Other Ambulatory Visit: Payer: Self-pay

## 2023-06-10 ENCOUNTER — Encounter (INDEPENDENT_AMBULATORY_CARE_PROVIDER_SITE_OTHER): Payer: Self-pay | Admitting: GENERAL SURGERY

## 2023-06-10 ENCOUNTER — Ambulatory Visit (HOSPITAL_BASED_OUTPATIENT_CLINIC_OR_DEPARTMENT_OTHER): Payer: Self-pay

## 2023-06-10 ENCOUNTER — Ambulatory Visit (HOSPITAL_BASED_OUTPATIENT_CLINIC_OR_DEPARTMENT_OTHER): Payer: Self-pay | Admitting: GENERAL SURGERY

## 2023-06-10 VITALS — Temp 96.8°F | Ht 68.9 in | Wt 272.3 lb

## 2023-06-10 DIAGNOSIS — K644 Residual hemorrhoidal skin tags: Secondary | ICD-10-CM

## 2023-06-10 DIAGNOSIS — Z09 Encounter for follow-up examination after completed treatment for conditions other than malignant neoplasm: Secondary | ICD-10-CM

## 2023-06-10 DIAGNOSIS — Z87891 Personal history of nicotine dependence: Secondary | ICD-10-CM | POA: Insufficient documentation

## 2023-06-10 DIAGNOSIS — Z471 Aftercare following joint replacement surgery: Secondary | ICD-10-CM | POA: Insufficient documentation

## 2023-06-10 DIAGNOSIS — Z96643 Presence of artificial hip joint, bilateral: Secondary | ICD-10-CM | POA: Insufficient documentation

## 2023-06-10 DIAGNOSIS — K6289 Other specified diseases of anus and rectum: Secondary | ICD-10-CM

## 2023-06-10 DIAGNOSIS — Z96642 Presence of left artificial hip joint: Secondary | ICD-10-CM

## 2023-06-10 DIAGNOSIS — Z96641 Presence of right artificial hip joint: Secondary | ICD-10-CM

## 2023-06-10 DIAGNOSIS — Z8546 Personal history of malignant neoplasm of prostate: Secondary | ICD-10-CM | POA: Insufficient documentation

## 2023-06-10 DIAGNOSIS — Z923 Personal history of irradiation: Secondary | ICD-10-CM | POA: Insufficient documentation

## 2023-06-10 DIAGNOSIS — K648 Other hemorrhoids: Secondary | ICD-10-CM | POA: Insufficient documentation

## 2023-06-10 NOTE — Telephone Encounter (Signed)
 CM contacted PT and went over Non Fasting guidelines for upcoming procedure on 04.29.25 with Dr Adriana Hopping. PT states they are not currently taking antibiotics. PT had no further questions at this time Clinic phone number was provided to PT at this time.    06/10/2023  Drusilla Gerlach, CASE MANAGER

## 2023-06-10 NOTE — Progress Notes (Addendum)
 [facility]    Patient name:  Henry [name]  MRN:  [id number]  Date of Birth:  [birth year]  Encounter Date:  06/10/2023    Subjective  This 68 y.o. male patient follows up 1years status post left total Hip replacement and 10 years status post right total hip replacement with Dr. Rodolfo Clan.  He offers no complaints relative to the prosthetic hip.  There is nothing that he cannot do at this point. The patient reports 1/10 pain. Patient does endorse low back pain that aggravates him, but he is scheduled to see Dr. Landa Pine in May.     Exam:  On exam, he is well appearing and in no apparent distress.  Temp 36 C (96.8 F) (Thermal Scan)   Ht 1.75 m (5' 8.9")   Wt 124 kg (272 lb 4.3 oz)   BMI 40.33 kg/m       Range of motion is full within bilateral hips   The surgical site is without signs of infection such as erythema, edema, or warmth.  Leg lengths are equal.  There is no obvious sign of DVT.    X-rays:  Today's x-rays were reviewed and show a well-fixed, well aligned bilateral total hip replacement without signs of loosening, lysis or excessive polyethylene wear.    Impression:    ICD-10-CM    1. S/P total left hip arthroplasty  Z96.642 XR HIPS BILATERAL-JOINT CENTER      2. Follow-up examination after orthopedic surgery  Z09 XR HIPS BILATERAL-JOINT CENTER      3. Status post total replacement of right hip  Z96.641            Plan:  The patient was encouraged to remain active and engage in low impact activity.  He was reminded of the signs and symptoms of prosthetic joint infection and things that place the patient at risk such as an abscessed tooth, bladder infection or even pneumonia.  The patient was encouraged to get those infections treated promptly and return to us  immediately if he develops new onset pain, swelling, or warmth.  Otherwise the patient will return in 2 years for routine follow up with an updated bilateral total hip series.  He was encouraged to call with questions or  concerns.    A shared visit was performed for this patient with the co-signing physician.  Nonnie Beagle, PA-C  06/10/2023, 09:51          Cc:    Enrique Harvest, MD  [facility] [address]  [facility] [facility] [address]    No referring provider defined for this encounter.

## 2023-06-11 DIAGNOSIS — Z471 Aftercare following joint replacement surgery: Secondary | ICD-10-CM

## 2023-06-11 DIAGNOSIS — Z96643 Presence of artificial hip joint, bilateral: Secondary | ICD-10-CM

## 2023-06-14 ENCOUNTER — Ambulatory Visit (INDEPENDENT_AMBULATORY_CARE_PROVIDER_SITE_OTHER): Payer: Self-pay

## 2023-06-15 ENCOUNTER — Other Ambulatory Visit: Payer: Self-pay

## 2023-06-15 ENCOUNTER — Ambulatory Visit: Payer: Self-pay | Attending: Neurological Surgery | Admitting: Neurological Surgery

## 2023-06-15 DIAGNOSIS — M48061 Spinal stenosis, lumbar region without neurogenic claudication: Secondary | ICD-10-CM | POA: Insufficient documentation

## 2023-06-15 DIAGNOSIS — M545 Low back pain, unspecified: Secondary | ICD-10-CM | POA: Insufficient documentation

## 2023-06-15 DIAGNOSIS — M5416 Radiculopathy, lumbar region: Secondary | ICD-10-CM | POA: Insufficient documentation

## 2023-06-15 DIAGNOSIS — M4316 Spondylolisthesis, lumbar region: Secondary | ICD-10-CM | POA: Insufficient documentation

## 2023-06-15 NOTE — Patient Instructions (Signed)
 [facility]  [address]  [place] [address]  Dept: [phone]  Dept Fax: [phone]  [phone]                                                 SPECIAL PROCEDURES                                     DISCHARGE FORM                                          [phone]      Please follow the instructions listed below for your procedures.  If you have questions concerning your procedure, you may call and leave a message.  Messages will be returned by the end of the next business day.  If you have an emergency, proceed to your local Emergency Department.      PROCEDURE: TRANSFORAMINAL EPIDURAL STEROID INJECTION    Do not drive a car or operate machinery until tomorrow.  Rest today and return to normal activities tomorrow.  If you are on restricted activities by your physician, please continue to follow these.  If you are not sure, contact your physician.  It is possible to experience mild numbness of the lower back and legs.  This is temporary.  If you have soreness at the injection site, the application of heat or ice may be helpful. Mild analgesics may also be used.  In case of severe headache; lie flat to decrease it.  Increase all fluids especially those with caffeine.  Mild analgesics are also appropriate.  If headache is not relieved by these measures, contact the Pain Clinic.  Steroid injections may cause temporary increase of blood sugar levels.    These instructions have been reviewed with the patient and appropriate questions have answers.  [address], [place] 06/15/2023 11:46

## 2023-06-15 NOTE — Procedures (Signed)
 [facility]  [address]  [facility] [place] [address]  Operated by [facility]  Procedure Note    Name: Lloyde Dechene MRN:  [id number]   Date: 06/15/2023 DOB:  [birth year] (68 y.o.)         INJ ANESTHETIC/STEROID-TRANSFORAMINAL EPIDURAL,LUMBAR/SACRAL,W FLUORO,UP TO 3 LEVELS    Performed by: Eva Hikes, MD  Authorized by: Eva Hikes, MD    Number of levels::  1      Eva Hikes, MD      Lumbosacral Transforaminal Epidural Steroid Injection with Fluoro    Pre Procedure Dx:  lumbar radic    Post Procedure Dx:  lumbar radic    Guidance:  Fluoroscopy    Assisting:  none    Level:  L4    Side:  left    Skin: 2ml 1% lido    Contrast:  3ml    Injectate:  10 mg dex and 2ml 0.25% bupi    Consent and timeout were done.  Patient was placed in the prone position.  Skin was prepped and draped in the routine fashion and sterile technique was maintained throughout.    In 30 degrees of ipsilateral oblique the fluoroscope was used to identify the neuroforamen  at:   left L4                 Overlying skin was anesthetized.  Then, a 22 gauge spinal needle was advanced until trajectory was established toward the superior foramen.  Next, a lateral view was obtained and the needle was advanced until it reached the superior/anterior margin of the foramen.  After negative aspiration, contrast was injected under live fluoroscopy in both AP and lateral views.  Characteristic epidural spread was noted.  No vascular spread was noted.  After repeat negative aspiration the above solution was injected.  Patient tolerated this very well.  Patient returned to the recovery area with no complaints or apparent complications.    Eva Hikes, MD

## 2023-06-15 NOTE — Nursing Note (Signed)
 [place] Pain Rating Scale     On a scale of 0-10, during the past 24 hours, pain has interfered with you usual activity: 7     On a scale of 0-10, during the past 24 hours, pain has interfered with your sleep: 4    On a scale of 0-10, during the past 24 hours, pain has affected your mood: 3     On a scale of 0-10, during the past 24 hours, pain has contributed to your stress: 3     On a scale of 0-10, what is your overall pain Rating: 7

## 2023-06-20 ENCOUNTER — Encounter (INDEPENDENT_AMBULATORY_CARE_PROVIDER_SITE_OTHER): Payer: Self-pay | Admitting: GENERAL SURGERY

## 2023-07-01 ENCOUNTER — Ambulatory Visit: Payer: Self-pay

## 2023-07-01 ENCOUNTER — Ambulatory Visit (INDEPENDENT_AMBULATORY_CARE_PROVIDER_SITE_OTHER): Payer: Self-pay

## 2023-07-01 ENCOUNTER — Other Ambulatory Visit: Payer: Self-pay

## 2023-07-01 ENCOUNTER — Encounter (INDEPENDENT_AMBULATORY_CARE_PROVIDER_SITE_OTHER): Payer: Self-pay

## 2023-07-01 VITALS — BP 151/78 | HR 65 | Temp 97.0°F | Resp 18 | Ht 69.0 in | Wt 272.7 lb

## 2023-07-01 DIAGNOSIS — M48062 Spinal stenosis, lumbar region with neurogenic claudication: Secondary | ICD-10-CM | POA: Insufficient documentation

## 2023-07-01 DIAGNOSIS — M4316 Spondylolisthesis, lumbar region: Secondary | ICD-10-CM | POA: Insufficient documentation

## 2023-07-01 DIAGNOSIS — M5416 Radiculopathy, lumbar region: Secondary | ICD-10-CM | POA: Insufficient documentation

## 2023-07-01 DIAGNOSIS — M791 Myalgia, unspecified site: Secondary | ICD-10-CM

## 2023-07-01 DIAGNOSIS — M48061 Spinal stenosis, lumbar region without neurogenic claudication: Secondary | ICD-10-CM

## 2023-07-01 DIAGNOSIS — M47816 Spondylosis without myelopathy or radiculopathy, lumbar region: Secondary | ICD-10-CM | POA: Insufficient documentation

## 2023-07-01 DIAGNOSIS — M545 Low back pain, unspecified: Secondary | ICD-10-CM

## 2023-07-01 NOTE — Progress Notes (Signed)
 [facility]  DEPARTMENT OF [place], [place] [address]      New Patient H&P    NAME: Karan Ramnauth   MRN: [id number]   CSN: [id number]   DATE OF SERVICE: 07/01/2023     Chief Complaint:   Back and leg pain    HPI:   68 year old male with several years of back and leg pain. This has been ongoing for several years and worsening with time. Lying down helps. Walking makes his pain worse. He also has pain rolling in bed. Extending his spine is worse, flexing his spine makes his pain better. His toes are numb on bilateral feet. He has attempted PT (helps), rhizotomy (>90% relief for 4-5 months), L4-L5 ESI (90% relief for 3 days), diclofenac , tylenol . He wants to know if there is anything else he can do, or if this is only manageable with non-operative management.     PMH:   Past Medical History:   Diagnosis Date    Arthritis     Back problem     Cancer (CMS HCC) 11/2015    prostate cancer, melanoma back neck    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    Chronic pain     CPAP (continuous positive airway pressure) dependence     CPAP 9 cm H2O    CPAP (continuous positive airway pressure) dependence     Deep vein thrombosis (DVT)     left leg    Eczema     GERD (gastroesophageal reflux disease)     well controlled on omeprazole     Heartburn     HTN     Hyperlipidemia     Hypertension     Irritable bowel syndrome     Melanoma     Morbid obesity with BMI of 40.0-44.9, adult (CMS HCC) 09/16/2021    Neck problem     obstructive sleep apnea 01/2010    AHI 53.8    Osteoarthritis of back     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     Medications:  atorvastatin  (LIPITOR) 40 mg Oral Tablet, Take 1 Tablet (40 mg total) by mouth Once a day  diclofenac  sodium (VOLTAREN ) 75 mg Oral Tablet, Delayed Release (E.C.), Take 1 Tablet (75 mg total) by mouth Twice daily  dicyclomine  (BENTYL ) 20 mg Oral Tablet,  Take 1 Tablet (20 mg total) by mouth Four times a day  famotidine  (PEPCID ) 40 mg Oral Tablet, Take 1 Tablet (40 mg total) by mouth Twice daily  GLUCOS CHOND CPLX ADVANCED ORAL, Take 2 Tabs by mouth Once a day Glucosamine-Chondroitin 1500 mg  hydroCHLOROthiazide  (HYDRODIURIL ) 25 mg Oral Tablet, Take 1 Tablet (25 mg total) by mouth Once a day  losartan  (COZAAR ) 100 mg Oral Tablet, Take 1 Tablet (100 mg total) by mouth Daily  metoprolol  tartrate (LOPRESSOR ) 50 mg Oral Tablet, Take 1 Tablet (50 mg total) by mouth Twice daily  metroNIDAZOLE  1 % Gel with Pump, by Apply externally route Once a day  multivitamin Oral Tablet, Take 1 Tablet by mouth Daily  Sildenafil  (VIAGRA ) 100 mg Oral Tablet, Take 1 Tablet (100 mg total) by mouth Every 24 hours as needed  triamcinolone  acetonide 0.1 % Cream,     No facility-administered medications prior to visit.     PSH:  Past Surgical History:   Procedure  Laterality Date    GALLBLADDER SURGERY      HX ANKLE FRACTURE TX Right     HX APPENDECTOMY      HX ERCP      HX HIP REPLACEMENT Right 05/22/2013    Per Dr. Rodolfo Clan    PB COLONOSCOPY,DIAGNOSTIC       PFH:  Family Medical History:       Problem Relation (Age of Onset)    Asthma Father    Atherosclerosis Paternal Grandmother    Breast Cancer Maternal Grandmother    COPD Father    Cancer Mother, Father    Coronary Artery Disease Mother    Diabetes Mother    High Cholesterol Mother    Hypertension (High Blood Pressure) Mother    Melanoma Brother    Migraines Mother    Other Maternal Grandfather    Prostate Cancer Paternal Grandfather    Stomach Cancer Maternal Grandmother          Allergies:  Allergies   Allergen Reactions    Sulfa (Sulfonamides) Rash    Shellfish Containing Products Itching and Swelling     Shrimp - eye swelling, itching      Social:  Social History     Tobacco Use    Smoking status: Former     Types: Cigars     Quit date: 2012     Years since quitting: 13.3    Smokeless tobacco: Never    Tobacco comments:     quit cigars in  2012   Vaping Use    Vaping status: Never Used   Substance Use Topics    Alcohol  use: Yes     Alcohol /week: 2.0 standard drinks of alcohol      Types: 2 Cans of beer per week     Comment: per week    Drug use: No     Comment: capsule      Physical Exam:  Patient is well-appearing and in no acute distress. Breathing is non-labored.    MOTOR   D B WE T FF HI  Right  5 5 5 5 5 5   Left 5 5 5 5 5 5      HF Q TA EHL G  Right 5 5 5 5 5   Left 5 5 5 5 5     SENSORY   C5 C6 C7 C8 T1  Right 2 2 2 2 2    Left 2 2 2 2 2       L2 L3 L4 L5 S1  Right 2 2 2 2 2   Left 2 2 2 2 2     Imaging  XR of the spine/MRI of the spine were reviewed by me and Dr. Landa Pine, which demonstrates multilevel spinal stenosis most severe at L4-L5 with spondylolisthesis grade I at that level.    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 His pain is primarily in the back with an element that radiates down his left leg in the L5 distribution.     Plan  Discussed options with patient including operative and nonoperative management.  Patient elected towards L4-L5 ESI (interlaminar) at this time.   Will see back for surgical discussion after he talks with his wife. Would likely be a 1 level fusion. Will need to see OMOP.   The patient demonstrated understanding and was agreeable with the above plan, with all questions answered.    Betti Browns MD  Orthopaedic Surgery Resident      I saw  and examined the patient. I independently reviewed the relevant imaging and my interpretation is noted below. I reviewed the resident's note.  I agree with the findings and plan of care as documented in the resident's note.  Any exceptions/additions are edited/noted.    68 y.o. male who presents for evaluation of back and bilateral leg pain consistent with a mixed claudicant and radicular picture. Radiographs of the lumbar spine obtained 04/02/23 demonstrate asymmetric disc degeneration at L3-L4 without significant scoliotic  curvature, a grade 1 spondylolisthesis at L4-L5, and facet arthropathy in the distal lumbar spine. There is approximately 7mm of listhesis between flexion and extension views, consistent with a mobile spondylolisthesis. LL 38, L4-S1 LL 15, SS 8, PI 51, PT 29, L1PA 17, PI-LL 13.  MRI of the lumbar spine done 02/25/23 demonstrates grade 1 spondylolisthesis at L4-L5 with effusion within the facet joints at this level.  Facet hypertrophy combines with spondylolisthesis to cause severe bilateral lateral recess and moderate central stenosis at this level in addition to severe bilateral foraminal stenosis.  Of note, there are mild degenerative changes throughout the lumbar spine with mild central stenosis at L2-L3 and mild-moderate bilateral lateral recess stenosis at L3-L4.  There is moderate foraminal stenosis on the right at L5-S1 and L3-L4.  He has previously tried PT, medical management and multiple injections with significant but temporary relief. We had a pleasant discussion today regarding his diagnoses, imaging findings, and treatment options. Since he has had good success with injections previously, we will try an interlaminar ESI at L4-L5 to see if this provides him with longer lasting. We discussed surgical options which for him I would recommend a L4-L5 TLIF vs bilateral PLIFs since he has a mobile spondylolisthesis. He would like to discuss this with his wife. If he opts to proceed with surgery, I encouraged him to bring his wife or a family member for a thorough discussion. He will also need EOS biplanars for surgical planning and an OMOP evaluation. I will see him back 2 weeks after the injection to assess his response.     Anette Barb, MD

## 2023-07-02 ENCOUNTER — Encounter (INDEPENDENT_AMBULATORY_CARE_PROVIDER_SITE_OTHER): Payer: Self-pay

## 2023-07-02 NOTE — Addendum Note (Signed)
 Addended by: Roniya Tetro ELIZABETH on: 07/02/2023 11:27 AM     Modules accepted: Level of Service

## 2023-07-06 ENCOUNTER — Ambulatory Visit (INDEPENDENT_AMBULATORY_CARE_PROVIDER_SITE_OTHER): Payer: Self-pay

## 2023-07-29 ENCOUNTER — Other Ambulatory Visit: Payer: Self-pay

## 2023-07-29 ENCOUNTER — Encounter (INDEPENDENT_AMBULATORY_CARE_PROVIDER_SITE_OTHER): Payer: Self-pay | Admitting: Anesthesiology

## 2023-07-29 ENCOUNTER — Ambulatory Visit: Payer: Self-pay | Admitting: Anesthesiology

## 2023-07-29 DIAGNOSIS — M47816 Spondylosis without myelopathy or radiculopathy, lumbar region: Secondary | ICD-10-CM | POA: Insufficient documentation

## 2023-07-29 NOTE — Procedures (Signed)
 [facility]  [address]  [facility] [address]  Operated by [facility]  Procedure Note    Name: [name] MRN:  [id number]   Date: 07/29/2023 DOB:  December 05, 1955 (68 y.o.)         [address]323 - INJECT DIAGN/THERAP SUBSTANCE W/ IMAGE GUIDANCE, INTERLAMINAR EPIDURAL/SUBARACHNOID, LUMBAR/SACRAL (AMB ONLY)    Performed by: Bettye Bruins, MD  Authorized by: Anette Barb, MD    Time Out:     Immediately before the procedure, a time out was called:  Yes    Patient verified:  Yes    Procedure Verified:  Yes    Site Verified:  Yes  Documentation:      L4-5 ESI   [facility]  [address]  [place] [address]  [phone]    Before the procedure was performed the patient was introduced to all the staff in the room and their duties or level of training  including providers.  If the patient had any questions they were answered completely at this time.    PATIENT NAME: Henry [name]  CHART GLOVFI:[id number]  DATE OF BIRTH: 1955/12/20  DATE OF SERVICE:07/29/2023    SITE OF SERVICE  [facility]  [facility]  [address]  [address]  [place] [address]  [phone]    PREOPERATIVE DIAGNOSIS:     ICD-10-CM    1. Lumbar spondylosis  M47.816                POSTOPERATIVE DIAGNOSIS: Same      NAME OF PROCEDURE: Lumbar Epidural steroid injectionL4-5 with fluoroscopy.  .     SURGEON: Bettye Bruins, MD    Assistant: Amie Bald DO          ANESTHESIA: Local        ESTIMATED BLOOD LOSS:None      COMPLICATIONS:  None: None      DESCRIPTION OF PROCEDURE:  After informed consent was obtained, the patient was taken to the procedure room and placed in the prone position.  The back was prepped with Hibiclens and draped with a sterile drape.  Fluoroscopy used to identify the L4-5 interspace.  Local infiltration with 1% lidocaine  was performed.  Loss of resistance  was used to confirm the epidural space.  No blood or CSF was noted.  Fluoroscopic guidance was used as needed.  4 mL volume injected  including 1.5 cc Dexamethasone  and 3cc of 0.2% Saline. The patient tolerated the procedure well.  The patient will follow up in clinic.    Linward Rick MD, MBA  Associate Professor Anesthesiology and Psychiatry   Director Outpatient Pain Services  [facility]        Bettye Bruins, MD 07/29/2023, 10:42        Bettye Bruins, MD

## 2023-07-29 NOTE — Patient Instructions (Signed)
 [facility]  [address]  [place] [address]  Dept: [phone]  Dept Fax: [phone]  [phone]                                                 SPECIAL PROCEDURES                                     DISCHARGE FORM                                          [phone]      Please follow the instructions listed below for your procedures.  If you have questions concerning your procedure, you may call and leave a message.  Messages will be returned by the end of the next business day.  If you have an emergency, proceed to your local Emergency Department.      PROCEDURE: LUMBAR EPIDURAL STERIOD INJECTION     Do not drive a car or operate machinery until tomorrow.  Rest today and return to normal activities tomorrow.  If you are on restricted activities by your physician, please continue to follow these.  If you are not sure, contact your physician.  It is possible to experience mild numbness of the lower back and legs.  This is temporary.  If you have soreness at the injection site, the application of heat or ice may be helpful. Mild analgesics may also be used.  In case of severe headache; lie flat to decrease it.  Increase all fluids especially those with caffeine.  Mild analgesics are also appropriate.  If headache is not relieved by these measures, contact the Pain Clinic.  Steroid injections may cause temporary increase of blood sugar levels.    These instructions have been reviewed with the patient and appropriate questions have answers.  Keturah Peer, Ambulatory Care Assistant 07/29/2023 10:25

## 2023-07-29 NOTE — Nursing Note (Signed)
 [place] Pain Rating Scale     On a scale of 0-10, during the past 24 hours, pain has interfered with you usual activity: 7     On a scale of 0-10, during the past 24 hours, pain has interfered with your sleep: 5    On a scale of 0-10, during the past 24 hours, pain has affected your mood: 4     On a scale of 0-10, during the past 24 hours, pain has contributed to your stress: 3     On a scale of 0-10, what is your overall pain Rating: 8

## 2023-08-12 ENCOUNTER — Encounter (INDEPENDENT_AMBULATORY_CARE_PROVIDER_SITE_OTHER): Payer: Self-pay

## 2023-08-12 ENCOUNTER — Other Ambulatory Visit: Payer: Self-pay

## 2023-08-12 ENCOUNTER — Ambulatory Visit: Payer: Self-pay

## 2023-08-12 DIAGNOSIS — M4316 Spondylolisthesis, lumbar region: Secondary | ICD-10-CM

## 2023-08-12 DIAGNOSIS — M48062 Spinal stenosis, lumbar region with neurogenic claudication: Secondary | ICD-10-CM

## 2023-08-12 DIAGNOSIS — M545 Low back pain, unspecified: Secondary | ICD-10-CM | POA: Insufficient documentation

## 2023-08-12 DIAGNOSIS — M47816 Spondylosis without myelopathy or radiculopathy, lumbar region: Secondary | ICD-10-CM

## 2023-08-12 DIAGNOSIS — M5416 Radiculopathy, lumbar region: Secondary | ICD-10-CM

## 2023-08-12 MED ORDER — GABAPENTIN 100 MG CAPSULE
100.0000 mg | ORAL_CAPSULE | Freq: Three times a day (TID) | ORAL | 0 refills | Status: DC
Start: 2023-08-12 — End: 2023-09-15

## 2023-08-16 ENCOUNTER — Other Ambulatory Visit (INDEPENDENT_AMBULATORY_CARE_PROVIDER_SITE_OTHER): Payer: Self-pay | Admitting: Physician Assistant

## 2023-08-16 MED ORDER — TRAMADOL 50 MG TABLET
1.0000 | ORAL_TABLET | Freq: Four times a day (QID) | ORAL | 0 refills | Status: AC | PRN
Start: 2023-08-16 — End: ?

## 2023-08-18 NOTE — Progress Notes (Signed)
 PATIENT NAME: Henry [name], Henry [name] [facility] Orthopaedic Surgery NUMBER:  [id number]  DATE OF SERVICE: 08/12/2023  DATE OF BIRTH:  [birth year]    PROGRESS NOTE    REASON FOR VISIT:  LESI at L4-L5 injection followup.    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 Pain is predominantly back pain with an element of L5 radiculopathy on the left.  Today, the patient reports that he is getting some symptoms on the right, but it is more on the left.  He rates his pain as 60% back pain versus 40% leg pain.  This most recent injection provided 60% relief for 2 weeks.  He said that it helped quite a bit and made his symptoms more tolerable and he was able to be on his feet more.  They did travel to [facility] and he did well during that trip.  He previously underwent a right TFESI at this same level, which was also helpful.  Radiofrequency ablations have been the most effective procedure to date.  Today, he notes more numbness and tingling in the bottoms of both of his feet.  Regarding his back pain, sitting does help.  He has noticed some toe dragging as well.  The patient denies bowel or bladder changes.  Denies saddle anesthesia.    OBJECTIVE:  The patient is sitting comfortably in a chair in no acute distress.  Sensation is intact to light touch bilateral lower extremities.  He has full strength in lumbar myotomes, 5 out of 5.    IMAGING:  No new imaging completed today.    ASSESSMENT AND PLAN:  A 68 year old male presents today for an injection followup.  Most recent LESI at L4-L5 did provide improvement in his symptoms.  The patient questions if he should move forward with surgery.  At this time, Dr. Elza recommends a trial of gabapentin  100 mg t.i.d. He will be traveling to the beach in 2 weeks.  He would like to follow up after that to see how he is doing.  Should he need additional pain medication, Dr. Elza is amenable to send in  tramadol  to help with his pain.  We can also try either repeating his ESI.  The patient can move forward with repeat radiofrequency ablation to target his axial back pain.  The patient will call in September with an update.  Otherwise, we will wait to hear from him.    I saw the patient in conjunction with the cosigning physician.        Vanessa Nageotte, PA-C    I personally saw and evaluated the patient. All relevant imaging was independently reviewed by me and my interpretations are noted below. See mid-level's note for additional details. My findings/participation are 68 y.o. 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  His pain is predominantly back pain with an element of L5 radiculopathy on the left.  This has been well managed with injections to date.  Medication adjustments as detailed above.  We again had a full surgical discussion including risks, benefits, and expected recovery.  He would like to think about this as he has several trips coming up in his so getting some relief from his last injection.  He will give us  an update in September on how he is doing and how he would like to proceed.  We  discussed that we can always repeat the injections as needed.  I will see him back as needed.    Maclean Foister E. Elza, MD                DD:  08/17/2023 09:27:26  DT:  08/18/2023 14:27:20 MH  D#:  [id number]

## 2023-08-24 ENCOUNTER — Encounter (INDEPENDENT_AMBULATORY_CARE_PROVIDER_SITE_OTHER): Payer: Self-pay

## 2023-08-25 ENCOUNTER — Encounter (INDEPENDENT_AMBULATORY_CARE_PROVIDER_SITE_OTHER): Payer: Self-pay

## 2023-09-08 ENCOUNTER — Encounter (INDEPENDENT_AMBULATORY_CARE_PROVIDER_SITE_OTHER): Payer: Self-pay

## 2023-09-13 ENCOUNTER — Other Ambulatory Visit (INDEPENDENT_AMBULATORY_CARE_PROVIDER_SITE_OTHER): Payer: Self-pay | Admitting: Physician Assistant

## 2023-09-15 ENCOUNTER — Other Ambulatory Visit (INDEPENDENT_AMBULATORY_CARE_PROVIDER_SITE_OTHER): Payer: Self-pay | Admitting: Physician Assistant

## 2023-09-15 MED ORDER — GABAPENTIN 300 MG CAPSULE
300.0000 mg | ORAL_CAPSULE | Freq: Three times a day (TID) | ORAL | 0 refills | Status: DC
Start: 2023-09-15 — End: 2023-11-10

## 2023-09-16 ENCOUNTER — Other Ambulatory Visit (HOSPITAL_BASED_OUTPATIENT_CLINIC_OR_DEPARTMENT_OTHER): Payer: Self-pay | Admitting: Student in an Organized Health Care Education/Training Program

## 2023-09-16 DIAGNOSIS — I10 Essential (primary) hypertension: Secondary | ICD-10-CM

## 2023-09-29 ENCOUNTER — Ambulatory Visit (HOSPITAL_BASED_OUTPATIENT_CLINIC_OR_DEPARTMENT_OTHER): Payer: Self-pay | Admitting: Student in an Organized Health Care Education/Training Program

## 2023-10-07 ENCOUNTER — Ambulatory Visit (INDEPENDENT_AMBULATORY_CARE_PROVIDER_SITE_OTHER): Payer: Self-pay | Admitting: GENERAL SURGERY

## 2023-10-10 NOTE — H&P (Signed)
 COLORECTAL SURGERY HISTORY and PHYSICAL    Patient: Henry [name]  D.O.B.: 12/05/1955  MRN# [id number]  Date of Service: 10/12/2023    Referring Provider: Franky Mcardle, MD      Chief Complaint: Rectal Mass     HPI from visit on 06/10/23:  Henry [name] is a 68 y.o. male presents for evaluation of of perianal mass he has felt for the last several months but does not feel like it is getting any bigger. He has a history of prostate cancer with radiation in 2017. He does have a history of hemorrhoids and when applying over the counter hemorrhoid cream he says he felt what appeared to be a mass. He was referred to me for further evaluation.    Subjective:   At that visit we discussed options moving forward. I did not feel this was a malignant process but we discussed excision for definitive pathology/management. We also discussed returning for re-evaluation in 3 months and he elected to return for re-evaluation. Since that visit he has no new complaints or changes in his bowel habits other than occasional blood on toilet paper when wiping.         Allergies:  Allergies   Allergen Reactions    Sulfa (Sulfonamides) Rash    Shellfish Containing Products Itching and Swelling     Shrimp - eye swelling, itching       Current Outpatient Prescriptions  Outpatient Medications Marked as Taking for the 10/12/23 encounter (Office Visit) with Jd Mccaster, Franky, MD   Medication Sig    atorvastatin  (LIPITOR) 40 mg Oral Tablet Take 1 Tablet (40 mg total) by mouth Once a day    diclofenac  sodium (VOLTAREN ) 75 mg Oral Tablet, Delayed Release (E.C.) Take 1 Tablet (75 mg total) by mouth Twice daily    dicyclomine  (BENTYL ) 20 mg Oral Tablet Take 1 Tablet (20 mg total) by mouth Four times a day    famotidine  (PEPCID ) 40 mg Oral Tablet Take 1 Tablet (40 mg total) by mouth Twice daily    gabapentin  (NEURONTIN ) 300 mg Oral Capsule Take 1 Capsule (300 mg total) by mouth Three times a day    GLUCOS CHOND CPLX ADVANCED ORAL Take 2 Tabs by  mouth Once a day Glucosamine-Chondroitin 1500 mg    hydroCHLOROthiazide  (HYDRODIURIL ) 25 mg Oral Tablet Take 1 Tablet (25 mg total) by mouth Daily    losartan  (COZAAR ) 100 mg Oral Tablet Take 1 Tablet (100 mg total) by mouth Daily    metoprolol  tartrate (LOPRESSOR ) 50 mg Oral Tablet Take 1 Tablet (50 mg total) by mouth Twice daily    metroNIDAZOLE  1 % Gel with Pump by Apply externally route Once a day    multivitamin Oral Tablet Take 1 Tablet by mouth Daily    Sildenafil  (VIAGRA ) 100 mg Oral Tablet Take 1 Tablet (100 mg total) by mouth Every 24 hours as needed    traMADoL  (ULTRAM ) 50 mg Oral Tablet Take 1 Tablet (50 mg total) by mouth Every 6 hours as needed for Pain    triamcinolone  acetonide 0.1 % Cream         Past Medical History:    Past Medical History:   Diagnosis Date    Arthritis     Back problem     Cancer (CMS HCC) 11/2015    prostate cancer, melanoma back neck    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    Chronic pain     CPAP (continuous positive airway pressure)  dependence     CPAP 9 cm H2O    CPAP (continuous positive airway pressure) dependence     Deep vein thrombosis (DVT)     left leg    Eczema     GERD (gastroesophageal reflux disease)     well controlled on omeprazole     Heartburn     HTN     Hyperlipidemia     Hypertension     Irritable bowel syndrome     Melanoma     Morbid obesity with BMI of 40.0-44.9, adult (CMS HCC) 09/16/2021    Neck problem     obstructive sleep apnea 01/2010    AHI 53.8    Osteoarthritis of back     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           Past Surgical History:    Past Surgical History:   Procedure Laterality Date    GALLBLADDER SURGERY      HX ANKLE FRACTURE TX Right     HX APPENDECTOMY      HX ERCP      HX HIP REPLACEMENT Right 05/22/2013    Per Dr. Fernande    PB COLONOSCOPY,DIAGNOSTIC             Family History:  Family Medical History:       Problem Relation (Age of  Onset)    Asthma Father    Atherosclerosis Paternal Grandmother    Breast Cancer Maternal Grandmother    COPD Father    Cancer Mother, Father    Coronary Artery Disease Mother    Diabetes Mother    High Cholesterol Mother    Hypertension (High Blood Pressure) Mother    Melanoma Brother    Migraines Mother    Other Maternal Grandfather    Prostate Cancer Paternal Grandfather    Stomach Cancer Maternal Grandmother              Social History:    Social History     Socioeconomic History    Marital status: Married     Spouse name: Not on file    Number of children: 0    Years of education: Not on file    Highest education level: Not on file   Occupational History     Employer: [employer]    Occupation: retired     Comment: formerly worked administration in [employer]   Tobacco Use    Smoking status: Former     Types: Cigars     Quit date: 2012     Years since quitting: 13.6    Smokeless tobacco: Never    Tobacco comments:     quit cigars in 2012   Vaping Use    Vaping status: Never Used   Substance and Sexual Activity    Alcohol  use: Yes     Alcohol /week: 2.0 standard drinks of alcohol      Types: 2 Cans of beer per week     Comment: per week    Drug use: No     Comment: capsule    Sexual activity: Not on file   Other Topics Concern    Abuse/Domestic Violence No    Caffeine Concern No    Calcium intake adequate Yes    Computer Use Yes    Drives Yes    Exercise Concern No    Helmet Use Not  Asked    Seat Belt Yes    Special Diet No    Sunscreen used Yes    Uses Cane Not Asked    Uses walker Not Asked    Uses wheelchair Not Asked    Right hand dominant Yes    Left hand dominant Not Asked    Ambidextrous Not Asked    Shift Work Not Asked    Unusual Sleep-Wake Schedule Not Asked    Ability to Walk 1 Flight of Steps without SOB/CP Yes    Routine Exercise Yes     Comment: [employer], 3-4 times a week, 2hrs.    Ability to Walk 2 Flight of Steps without SOB/CP Yes    Unable to Ambulate Not Asked    Total Care Not Asked    Ability To  Do Own ADL's Yes    Uses Walker Not Asked    Other Activity Level Yes     Comment: 14 steps at home. walks a lot. shoveled snow this winter. limited by pain.    Uses Cane Not Asked   Social History Narrative    Not on file     Social Determinants of Health     Financial Resource Strain: Not on file   Transportation Needs: Not on file   Social Connections: Low Risk  (06/15/2022)    Social Connections     SDOH Social Isolation: 5 or more times a week   Intimate Partner Violence: Not on file   Housing Stability: Not on file         Objective:  BP (!) 146/80 (Patient Position: Sitting) Comment: high bp; no other symptoms; md notified  Pulse 76   Temp 36.4 C (97.5 F)   Wt 124 kg (274 lb 7.6 oz)   SpO2 100%   BMI 40.53 kg/m         Physical Exam:  Constitutional: Pleasant, AAOx3, NAD  HEENT: Normocephalic, atraumatic. No scleral icterus.  Neck: Trachea midline, supple.   Cardiovascular: RRR; No audible murmurs.  Pulmonary: Normal respiratory effort. No retractions.  No wheezes or stridor.  Abdomen: Abdomen soft and supple; No tenderness. Non-distended; No masses or HSM present.  Extremities: WWP.  Musculoskeletal: Normal ROM in all four extremities.   Skin: No rashes. Warm and dry.  Psychiatric: Normal mood and affect; Judgement and thought content normal.  Anorectal: anoscopy with small posterior midline sentinel pile with very small fissure.     Procedure Note  Type of Procedure: Anoscopy  Procedure Date: 10/12/2023    Performing Provider: Franky HERO. Namita Yearwood, MD  Clinical Indications: rectal mass    Description of Procedure: After verbal consent was obtained, the patient was placed in the prone position on the proctology table.  After performing an external visual examination of the perianal area and performing a digital rectal examination, a well-lubricated disposable lighted anoscope was gently inserted through the anal sphincter complex and advanced to the distal rectum easily. A 360 degree visual examination of  the anal canal mucosa was performed. The mucosa was examined and there was circumferential internal hemorrhoids and small posterior skin tag consistent with sentinel pile.      The patient tolerated the procedure well.  I performed the procedure.    Specimens sent: none  Estimated Blood Loss: none        Assessment:  Henry [name] is a 68 y.o. male who initially presented in April with concern for a rectal mass found to have a posterior skin tag which  does not appear to be malignant. On examination today the skin tag does not appear to have changed size or worsened.    Plan:  We discussed options moving forward. I do not feel this is a malignant process. If he were to notice any changes in bowel habits or the size, should contact the office.   Okay to follow up prn for now    On the day of the encounter, a total of 20 minutes was spent on this patient encounter including review of historical information, examination, documentation and post-visit activities, >50% of which was spent directly counseling the patient regarding treatment options and coordinating care.     Franky Mcardle, MD  Colon and Rectal Surgery

## 2023-10-12 ENCOUNTER — Encounter (INDEPENDENT_AMBULATORY_CARE_PROVIDER_SITE_OTHER): Payer: Self-pay

## 2023-10-12 ENCOUNTER — Ambulatory Visit: Attending: GENERAL SURGERY | Admitting: GENERAL SURGERY

## 2023-10-12 ENCOUNTER — Encounter (INDEPENDENT_AMBULATORY_CARE_PROVIDER_SITE_OTHER): Payer: Self-pay | Admitting: GENERAL SURGERY

## 2023-10-12 ENCOUNTER — Other Ambulatory Visit: Payer: Self-pay

## 2023-10-12 VITALS — BP 146/80 | HR 76 | Temp 97.5°F | Wt 274.5 lb

## 2023-10-12 DIAGNOSIS — K6289 Other specified diseases of anus and rectum: Secondary | ICD-10-CM | POA: Insufficient documentation

## 2023-10-12 DIAGNOSIS — Z923 Personal history of irradiation: Secondary | ICD-10-CM | POA: Insufficient documentation

## 2023-10-12 DIAGNOSIS — Z8546 Personal history of malignant neoplasm of prostate: Secondary | ICD-10-CM | POA: Insufficient documentation

## 2023-10-12 DIAGNOSIS — Z87891 Personal history of nicotine dependence: Secondary | ICD-10-CM | POA: Insufficient documentation

## 2023-10-12 DIAGNOSIS — K649 Unspecified hemorrhoids: Secondary | ICD-10-CM | POA: Insufficient documentation

## 2023-10-14 ENCOUNTER — Other Ambulatory Visit (INDEPENDENT_AMBULATORY_CARE_PROVIDER_SITE_OTHER): Payer: Self-pay | Admitting: Physician Assistant

## 2023-10-14 MED ORDER — GABAPENTIN 400 MG CAPSULE
400.0000 mg | ORAL_CAPSULE | Freq: Three times a day (TID) | ORAL | 0 refills | Status: DC
Start: 2023-10-14 — End: 2023-11-10

## 2023-10-14 MED ORDER — GABAPENTIN 300 MG CAPSULE
300.0000 mg | ORAL_CAPSULE | Freq: Every day | ORAL | 0 refills | Status: DC
Start: 2023-10-14 — End: 2023-11-10

## 2023-10-15 ENCOUNTER — Other Ambulatory Visit (INDEPENDENT_AMBULATORY_CARE_PROVIDER_SITE_OTHER): Payer: Self-pay | Admitting: Physician Assistant

## 2023-10-15 DIAGNOSIS — M4316 Spondylolisthesis, lumbar region: Secondary | ICD-10-CM

## 2023-10-15 DIAGNOSIS — M48062 Spinal stenosis, lumbar region with neurogenic claudication: Secondary | ICD-10-CM

## 2023-10-15 DIAGNOSIS — M5416 Radiculopathy, lumbar region: Secondary | ICD-10-CM

## 2023-10-19 ENCOUNTER — Other Ambulatory Visit (HOSPITAL_BASED_OUTPATIENT_CLINIC_OR_DEPARTMENT_OTHER): Payer: Self-pay | Admitting: Student in an Organized Health Care Education/Training Program

## 2023-10-19 DIAGNOSIS — E785 Hyperlipidemia, unspecified: Secondary | ICD-10-CM

## 2023-10-20 MED ORDER — ATORVASTATIN 40 MG TABLET
40.0000 mg | ORAL_TABLET | Freq: Every day | ORAL | 3 refills | Status: AC
Start: 2023-10-20 — End: ?

## 2023-10-31 ENCOUNTER — Encounter (INDEPENDENT_AMBULATORY_CARE_PROVIDER_SITE_OTHER): Payer: Self-pay

## 2023-11-08 ENCOUNTER — Other Ambulatory Visit (INDEPENDENT_AMBULATORY_CARE_PROVIDER_SITE_OTHER): Payer: Self-pay | Admitting: Physician Assistant

## 2023-11-09 ENCOUNTER — Other Ambulatory Visit: Payer: Self-pay

## 2023-11-09 ENCOUNTER — Ambulatory Visit: Attending: Student in an Organized Health Care Education/Training Program

## 2023-11-09 DIAGNOSIS — R7303 Prediabetes: Secondary | ICD-10-CM | POA: Insufficient documentation

## 2023-11-09 LAB — HGA1C (HEMOGLOBIN A1C WITH EST AVG GLUCOSE)
ESTIMATED AVERAGE GLUCOSE: 140 mg/dL
HEMOGLOBIN A1C: 6.5 % — ABNORMAL HIGH (ref 4.0–5.6)

## 2023-11-10 ENCOUNTER — Encounter (HOSPITAL_BASED_OUTPATIENT_CLINIC_OR_DEPARTMENT_OTHER): Payer: Self-pay | Admitting: Student in an Organized Health Care Education/Training Program

## 2023-11-10 ENCOUNTER — Ambulatory Visit
Attending: Student in an Organized Health Care Education/Training Program | Admitting: Student in an Organized Health Care Education/Training Program

## 2023-11-10 ENCOUNTER — Ambulatory Visit (HOSPITAL_BASED_OUTPATIENT_CLINIC_OR_DEPARTMENT_OTHER): Payer: Self-pay | Admitting: Student in an Organized Health Care Education/Training Program

## 2023-11-10 ENCOUNTER — Other Ambulatory Visit (INDEPENDENT_AMBULATORY_CARE_PROVIDER_SITE_OTHER): Payer: Self-pay | Admitting: Physician Assistant

## 2023-11-10 VITALS — BP 134/74 | HR 72 | Temp 97.3°F | Ht 69.02 in | Wt 272.0 lb

## 2023-11-10 DIAGNOSIS — Z135 Encounter for screening for eye and ear disorders: Secondary | ICD-10-CM

## 2023-11-10 DIAGNOSIS — E66813 Obesity, class 3: Secondary | ICD-10-CM | POA: Insufficient documentation

## 2023-11-10 DIAGNOSIS — M5090 Cervical disc disorder, unspecified, unspecified cervical region: Secondary | ICD-10-CM | POA: Insufficient documentation

## 2023-11-10 DIAGNOSIS — E119 Type 2 diabetes mellitus without complications: Secondary | ICD-10-CM | POA: Insufficient documentation

## 2023-11-10 DIAGNOSIS — M159 Polyosteoarthritis, unspecified: Secondary | ICD-10-CM | POA: Insufficient documentation

## 2023-11-10 DIAGNOSIS — Z6841 Body Mass Index (BMI) 40.0 and over, adult: Secondary | ICD-10-CM | POA: Insufficient documentation

## 2023-11-10 DIAGNOSIS — I10 Essential (primary) hypertension: Secondary | ICD-10-CM | POA: Insufficient documentation

## 2023-11-10 LAB — DIABETIC RETINAL PHOTOS-OPTOS: REPEAT IMAGING: 1

## 2023-11-10 LAB — MICROALBUMIN/CREATININE RATIO, URINE, RANDOM
CREATININE RANDOM URINE: 46 mg/dL
MICROALBUMIN RANDOM URINE: 0.8 mg/dL
MICROALBUMIN/CREATININE RATIO RANDOM URINE: 17.4 mg/g (ref <30.0)

## 2023-11-10 MED ORDER — BLOOD GLUCOSE CONTROL, NORMAL SOLUTION
11 refills | Status: AC
Start: 2023-11-10 — End: 2023-11-11

## 2023-11-10 MED ORDER — LANCETS 26 GAUGE
11 refills | Status: AC
Start: 2023-11-10 — End: 2023-11-11

## 2023-11-10 MED ORDER — GABAPENTIN 400 MG CAPSULE
400.0000 mg | ORAL_CAPSULE | Freq: Three times a day (TID) | ORAL | 1 refills | Status: DC
Start: 2023-11-10 — End: 2023-12-30

## 2023-11-10 MED ORDER — BLOOD SUGAR DIAGNOSTIC STRIPS
ORAL_STRIP | 3 refills | Status: AC
Start: 2023-11-10 — End: 2023-11-11

## 2023-11-10 MED ORDER — BLOOD-GLUCOSE METER
0 refills | Status: DC
Start: 2023-11-10 — End: 2023-11-24

## 2023-11-10 NOTE — Progress Notes (Signed)
 Department of Family Medicine   Outpatient Follow Up  Henry [name] [facility]  [id number]  [birth year]     History of Present Illness  Henry [name] is a 68 year old male with arthritis and type 2 diabetes who presents for follow-up on pain management and diabetes control.    He has experienced significant improvement in pain management since starting gabapentin , now at 1200 mg per day, which has enhanced his ability to perform daily activities and exercise. He still experiences some pain, particularly in his back and hips, influenced by weather conditions. Arthritis affects his knees, back, and hips, and he has two artificial hips. His previous work involved long hours on concrete floors, which he believes contributed to his condition. There is a family history of arthritis.    His type 2 diabetes is currently managed without medication, with an A1c of 6.5. He aims to improve his diet and exercise routine, as he is a stress eater. There is a family history of type 2 diabetes.    He continues to experience knee pain despite normal x-ray findings. His blood pressure is well-controlled, and he avoids caffeine due to IBS. He has recently received flu and RSV vaccinations with mild side effects. He is scheduled for a cruise in November, indicating improved mobility and quality of life.    Past medical and social history reviewed with patient and updated in EHR.    Objective   BP 134/74   Pulse 72   Temp 36.3 C (97.3 F) (Thermal Scan)   Ht 1.753 m (5' 9.02)   Wt 123 kg (272 lb 0.8 oz)   SpO2 97%   BMI 40.16 kg/m       General: Alert, In no acute distress    HENT: Normocephalic, Atraumatic  Pulmonary: Normal respiratory effort, Clear to auscultation bilaterally  Cardiovascular: Well Perfused, RRR, No murmur appreciated  Abdominal/Gastrointestinal: Non-distended  Extremities: No cyanosis, No edema bilaterally  Integumentary: Warm and dry, No rash  Neurologic: Alert and oriented, CN II-XII grossly in tact, Normal  speech  Psychiatric: Normal affect and behavior         Assessment & Plan  Type 2 diabetes mellitus, diet controlled  Type 2 diabetes mellitus confirmed with an A1c of 6.5%, managed with diet. No medication required.  - Provide supplies for blood glucose monitoring.  - Order A1c and comprehensive metabolic panel in 4 months.  - Encourage dietary changes and increased exercise.  - Discuss potential use of injectables like Ozempic or Trulicity if A1c does not improve in 4 months.  - Check urine sample and retinal imaging for diabetes-related damage.    Morbid obesity (BMI 40.0-44.9)  Morbid obesity with BMI 40.0-44.9. Weight loss crucial for diabetes management.  - Encourage dietary changes and increased physical activity.  - Discuss potential use of weight loss medications if lifestyle changes are insufficient.    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 Symptoms improved with gabapentin .  - Continue gabapentin  at current dose.  - Plan for injection prior to travel in November.    Primary hypertension  Primary hypertension well-controlled with current medication regimen.  - Continue current antihypertensive medications.    General Health Maintenance  Recent vaccinations include flu and RSV shots. Advised against multiple vaccinations on the same day.  - Ensure vaccinations are up to date.       Return in about 4 months (around 03/11/2024), or if symptoms worsen  or fail to improve.    This note was created with assistance from [employer] via capture of conversational audio.  Consent was obtained from the patient prior to recording.      Liisa Bott, MD   Family Medicine     [facility]

## 2023-11-11 ENCOUNTER — Ambulatory Visit: Attending: Ophthalmology

## 2023-11-11 DIAGNOSIS — E119 Type 2 diabetes mellitus without complications: Secondary | ICD-10-CM | POA: Insufficient documentation

## 2023-11-11 NOTE — Progress Notes (Signed)
 [facility]  [address]  [place] [address]  Operated by [facility]    Name: Henry [name] MRN:  [id number]   Date: 11/11/2023 DOB:  25-Oct-1955 (68 y.o.)      Please see Anc Orders tab for OPTOS report and images.     Interpretation:    Lab Results   Component Value Date    DIABETIC Yes 11/10/2023    IMAGQUAL Good - gradable 11/10/2023    ODDRDX None Observed 11/10/2023    OSDRDX None Observed 11/10/2023    DRDX None Observed 11/10/2023    DMEDX None Observed 11/10/2023    REF No 11/10/2023    REFTIM No Referral 11/10/2023    REPIMAG 1 year 11/10/2023

## 2023-11-24 ENCOUNTER — Other Ambulatory Visit (HOSPITAL_BASED_OUTPATIENT_CLINIC_OR_DEPARTMENT_OTHER): Payer: Self-pay | Admitting: Student in an Organized Health Care Education/Training Program

## 2023-11-24 DIAGNOSIS — E119 Type 2 diabetes mellitus without complications: Secondary | ICD-10-CM

## 2023-11-24 MED ORDER — BLOOD-GLUCOSE METER
0 refills | Status: AC
Start: 2023-11-24 — End: 2023-11-25

## 2023-12-06 ENCOUNTER — Encounter (INDEPENDENT_AMBULATORY_CARE_PROVIDER_SITE_OTHER): Payer: Self-pay

## 2023-12-06 ENCOUNTER — Telehealth (INDEPENDENT_AMBULATORY_CARE_PROVIDER_SITE_OTHER): Payer: Self-pay | Admitting: Neurological Surgery

## 2023-12-06 NOTE — Telephone Encounter (Signed)
 CM left voice message requesting a return phone call to review guidelines. A MyChart message was sent to PT about Guidelines.    12/06/2023  Toribio Molt, CASE MANAGER

## 2023-12-10 ENCOUNTER — Encounter (INDEPENDENT_AMBULATORY_CARE_PROVIDER_SITE_OTHER): Payer: Self-pay | Admitting: Physician Assistant

## 2023-12-10 NOTE — Nursing Note (Signed)
 Received gabapentin  refill request. Patient advised he should have one refill on previous prescription. Instructed to contact pharmacy for refill. If any issues, call back and I will send in refill. Vanessa Nageotte, PA-C  12/10/2023

## 2023-12-20 ENCOUNTER — Other Ambulatory Visit: Payer: Self-pay

## 2023-12-20 ENCOUNTER — Ambulatory Visit: Payer: Self-pay | Attending: Neurological Surgery | Admitting: Neurological Surgery

## 2023-12-20 VITALS — Temp 97.3°F | Resp 20 | Wt 272.8 lb

## 2023-12-20 DIAGNOSIS — M5416 Radiculopathy, lumbar region: Secondary | ICD-10-CM | POA: Insufficient documentation

## 2023-12-20 DIAGNOSIS — M4316 Spondylolisthesis, lumbar region: Secondary | ICD-10-CM | POA: Insufficient documentation

## 2023-12-20 DIAGNOSIS — M48062 Spinal stenosis, lumbar region with neurogenic claudication: Secondary | ICD-10-CM | POA: Insufficient documentation

## 2023-12-20 DIAGNOSIS — E119 Type 2 diabetes mellitus without complications: Secondary | ICD-10-CM | POA: Insufficient documentation

## 2023-12-20 LAB — POC BLOOD GLUCOSE (RESULTS): GLUCOSE, POC: 182 mg/dL — ABNORMAL HIGH (ref 65–125)

## 2023-12-20 NOTE — Nursing Note (Signed)
 [place] Pain Rating Scale     On a scale of 0-10, during the past 24 hours, pain has interfered with you usual activity: 7     On a scale of 0-10, during the past 24 hours, pain has interfered with your sleep: 2    On a scale of 0-10, during the past 24 hours, pain has affected your mood: 2     On a scale of 0-10, during the past 24 hours, pain has contributed to your stress: 1     On a scale of 0-10, what is your overall pain Rating: 5

## 2023-12-20 NOTE — Patient Instructions (Signed)
 [facility]  [address]  [place] [address]  Dept: [phone]  Dept Fax: [phone]  [phone]                                                 SPECIAL PROCEDURES                                     DISCHARGE FORM                                          [phone]      Please follow the instructions listed below for your procedures.  If you have questions concerning your procedure, you may call and leave a message.  Messages will be returned by the end of the next business day.  If you have an emergency, proceed to your local Emergency Department.      PROCEDURE: LESI    Do not drive a car or operate machinery until tomorrow.  Rest today and return to normal activities tomorrow.  If you are on restricted activities by your physician, please continue to follow these.  If you are not sure, contact your physician.  It is possible to experience mild numbness of the lower back and legs.  This is temporary.  If you have soreness at the injection site, the application of heat or ice may be helpful. Mild analgesics may also be used.  In case of severe headache; lie flat to decrease it.  Increase all fluids especially those with caffeine.  Mild analgesics are also appropriate.  If headache is not relieved by these measures, contact the Pain Clinic.  Steroid injections may cause temporary increase of blood sugar levels.    These instructions have been reviewed with the patient and appropriate questions have answers.  Hardy, [place] 12/20/2023 08:42

## 2023-12-20 NOTE — Procedures (Signed)
 [facility]  [address]  [facility] [place] [address]  Operated by [facility]  Procedure Note    Name: Henry [name] MRN:  [id number]   Date: 12/20/2023 DOB:  Oct 27, 1955 (68 y.o.)         [address] - INJECT DIAGN/THERAP SUBSTANCE W/ IMAGE GUIDANCE, INTERLAMINAR EPIDURAL/SUBARACHNOID, LUMBAR/SACRAL (AMB ONLY)    Performed by: Fredirick Dorn ORN, MD  Authorized by: Aline Hawk, PA-C        Dorn ORN Fredirick, MD    Lumbar Epidural Steroid Injection with Fluoro    Pre Procedure Dx:  lumbar stenosis    Post Procedure Dx:  lumbar stenosis    Guidance:  Fluoroscopy saved to PACS    Assisting:  none    Level:  L4/5    Skin anesthetic:  5ml 1% lido    Injectate:  10 mg dex and 4ml 0.25% bupi    Contrast:  5ml    Consent and timeout were done.  Patient was placed in the prone position.  Skin was prepped and draped in the routine fashion and sterile technique was maintained throughout.    In an AP fluoroscopic view the level listed above was identified.  Skin was anesthetized.  Next, a 20 gauge Touhey needle was advanced in the midline until trajectory was established.  Next, a lateral view was obtained.  Once the needle reached the posterior aspect of the spinous processes, loss of resistance technique was utilized.  Once the loss of resistance occurred, contrast was injected between AP and lateral views under live fluoroscopy.  Characteristic epidural spread was noted.  No vascular spread was noted.  After repeat negative aspiration the above solution was injected.  Patient tolerated this very well.  Patient returned to the recovery area with no complaints or apparent complications.    Dorn ORN Fredirick, MD

## 2023-12-22 ENCOUNTER — Encounter (HOSPITAL_BASED_OUTPATIENT_CLINIC_OR_DEPARTMENT_OTHER): Payer: Self-pay

## 2023-12-23 ENCOUNTER — Encounter (INDEPENDENT_AMBULATORY_CARE_PROVIDER_SITE_OTHER): Payer: Self-pay

## 2023-12-30 ENCOUNTER — Ambulatory Visit: Attending: Physician Assistant

## 2023-12-30 ENCOUNTER — Encounter (INDEPENDENT_AMBULATORY_CARE_PROVIDER_SITE_OTHER): Payer: Self-pay

## 2023-12-30 ENCOUNTER — Other Ambulatory Visit: Payer: Self-pay

## 2023-12-30 DIAGNOSIS — M4316 Spondylolisthesis, lumbar region: Secondary | ICD-10-CM | POA: Insufficient documentation

## 2023-12-30 MED ORDER — GABAPENTIN 400 MG CAPSULE: 400.0000 mg | ORAL_CAPSULE | Freq: Three times a day (TID) | ORAL | 1 refills | Status: DC

## 2023-12-30 NOTE — Progress Notes (Addendum)
 [facility]  [address]  [place], [place] [address]  [phone]            DATE OF SERVICE:  12/30/2023  NAME: Henry [name]   DOB: 1955/06/27   REFERRING: Elza Hang, MD  [address]  [address]  [place],  [place] [address]    NAME OF PROCEDURE:  LESI L4-L5 on 12/20/2023.    SUBJECTIVE:  68 year old male returns today for follow up for above-mentioned epidural injection.  The patient reports most recent injection is providing about 80% relief in his back and leg pain.  Prior to injection he was experiencing some burning pain into the left hip over GTB.  This is since resolved.  The patient reports going up and down steps as 100% better.  The patient continues to go to the gym and exercise for 2 hours 3 times a week and he actually feels really good afterwards.  Prior LESI L4-5 provided 60% relief for 2 weeks.  He denies any bowel or bladder changes with the exception of some constipation.  History includes IBS.      OBJECTIVE:  The patient is sitting comfortably in chair in no acute distress.  He is alert and oriented.  He walks without assistive device.  Sensation intact to light touch bilateral lower extremities.  He has full strength in lumbar myotomes 5/5.      IMAGING:  No new imaging completed today.  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      ASSESSMENT AND PLAN:  Very pleasant 68 year old male returns today for injection follow up.  Most recent epidural injection has been very effective.  He also believes gabapentin  is helpful as well.  We will send in refill today.  Discussed potentially weaning medication.  The patient instructed to follow up via phone/MyChart upon his return from cruise to further discuss decreasing gabapentin  and instructions.  Hopefully most recent epidural injection will provide durable relief.  Should relief be short term, patient to schedule a follow up with Dr. Elza to discuss surgical intervention.          I saw  the patient independently.  Discussed plan of care with Dr. Elza.  Vanessa Nageotte, PA-C  12/30/2023      Case and imaging reviewed and discussed with APP listed above. Patient seen independently by APP.     Tyrese Ficek E. Elza, MD

## 2024-01-06 ENCOUNTER — Other Ambulatory Visit (HOSPITAL_COMMUNITY): Payer: Self-pay

## 2024-01-11 ENCOUNTER — Other Ambulatory Visit (HOSPITAL_BASED_OUTPATIENT_CLINIC_OR_DEPARTMENT_OTHER): Payer: Self-pay | Admitting: Student in an Organized Health Care Education/Training Program

## 2024-01-11 DIAGNOSIS — I10 Essential (primary) hypertension: Secondary | ICD-10-CM

## 2024-01-16 ENCOUNTER — Encounter (INDEPENDENT_AMBULATORY_CARE_PROVIDER_SITE_OTHER): Payer: Self-pay

## 2024-02-22 ENCOUNTER — Ambulatory Visit (HOSPITAL_BASED_OUTPATIENT_CLINIC_OR_DEPARTMENT_OTHER): Payer: Self-pay | Admitting: Nurse Practitioner

## 2024-03-09 ENCOUNTER — Other Ambulatory Visit: Payer: Self-pay

## 2024-03-09 ENCOUNTER — Encounter (HOSPITAL_COMMUNITY): Payer: Self-pay

## 2024-03-09 ENCOUNTER — Emergency Department (HOSPITAL_COMMUNITY)
Admission: EM | Admit: 2024-03-09 | Discharge: 2024-03-09 | Disposition: A | Discharge status: Home / Self Care | Attending: Emergency Medicine | Admitting: Emergency Medicine

## 2024-03-09 ENCOUNTER — Telehealth: Payer: Self-pay

## 2024-03-09 ENCOUNTER — Emergency Department (HOSPITAL_COMMUNITY)

## 2024-03-09 DIAGNOSIS — G8929 Other chronic pain: Secondary | ICD-10-CM | POA: Insufficient documentation

## 2024-03-09 DIAGNOSIS — M25551 Pain in right hip: Secondary | ICD-10-CM | POA: Insufficient documentation

## 2024-03-09 MED ORDER — HYDROMORPHONE HCL 1 MG/ML IJ SOLN
1.0000 mg | Freq: Once | INTRAMUSCULAR | Status: AC
  Administered 2024-03-09: 1 mg via INTRAVENOUS
  Filled 2024-03-09: qty 1

## 2024-03-09 MED ORDER — FENTANYL CITRATE (PF) 50 MCG/ML IJ SOSY
50.0000 ug | PREFILLED_SYRINGE | Freq: Once | INTRAMUSCULAR | Status: AC
  Administered 2024-03-09: 50 ug via INTRAVENOUS
  Filled 2024-03-09: qty 1

## 2024-03-09 MED ORDER — HYDROMORPHONE HCL 1 MG/ML IJ SOLN
0.5000 mg | Freq: Once | INTRAMUSCULAR | Status: AC
  Administered 2024-03-09: 0.5 mg via INTRAVENOUS
  Filled 2024-03-09: qty 1

## 2024-03-09 MED ORDER — HYDROCODONE-ACETAMINOPHEN 5-325 MG PO TABS: 1.0000 | ORAL_TABLET | Freq: Once | ORAL | Status: DC

## 2024-03-09 NOTE — Care Management (Signed)
 Received consult for [facility] needs. RNCM attempted to contact pt. Prior to d/c. No answer.

## 2024-03-09 NOTE — ED Notes (Signed)
 Present nurse called Natalie Benjamin (Daughter), no answer.

## 2024-03-09 NOTE — Care Management (Signed)
 RNCM received call back from [facility] representative who provided contact information for SW Deloras Solian @ [phone] ext. [phone]. Patient's PCP  at the [facility] is [facility].  RNCM attempted call to SW. LDVM regarding HH orders.

## 2024-03-09 NOTE — ED Triage Notes (Signed)
 Pt BIBA from home, c/o worsening right hip pain.  Pt was taking meds from the [place] but started taking THC gummies to help and [facility] stopped prescribing pain [facility]. 200 mcg fentanyl  20G LFA  BP 200/110 HR 110 RR 24 O2 100 RA

## 2024-03-09 NOTE — ED Provider Notes (Signed)
 I spoke to daughter, we discussed at length that patient will likely need nursing home placement.  However he prefers to go home for now, and await case management and home health. She agrees with this plan.   Midge Golas, MD 03/09/24 [phone]

## 2024-03-09 NOTE — ED Notes (Signed)
 Daughter Laneta was contacted by patient phone and call was transferred to [facility] MD.

## 2024-03-09 NOTE — ED Provider Notes (Signed)
 " [place] EMERGENCY DEPARTMENT AT [facility] Provider Note   CSN: [id number] Arrival date & time: 03/09/24  9956     Patient presents with: No chief complaint on file.   Martin Singleton is a 69 y.o. male.   The history is provided by the patient.  Patient with previous history of right hip surgery, presents with increasing chronic right hip pain. No falls or trauma.  No new weakness in the leg.  He typically has difficulty moving the right leg Called 911 tonight due to severe pain. He has had issues getting pain management due to use of THC Gummies    Prior to Admission medications  Medication Sig Start Date End Date Taking? Authorizing Provider  acetaminophen  (TYLENOL ) 500 MG tablet Take 500 mg by mouth every 6 (six) hours as needed.    [provider]  celecoxib (CELEBREX) 200 MG capsule Take 200 mg by mouth 2 (two) times daily.    [provider]  docusate sodium (COLACE) 100 MG capsule Take 100 mg by mouth 2 (two) times daily.    [provider]  hydrophilic ointment Apply topically as needed for dry skin.    [provider]  magnesium oxide (MAG-OX) 400 (240 Mg) MG tablet Take 400 mg by mouth daily.    [provider]  mirtazapine (REMERON) 15 MG tablet Take 15 mg by mouth at bedtime.    [provider]  omeprazole (PRILOSEC) 20 MG capsule Take 40 mg by mouth daily.    [provider]  psyllium (METAMUCIL) 58.6 % packet Take 1 packet by mouth daily.    [provider]  tamsulosin (FLOMAX) 0.4 MG CAPS capsule Take 0.4 mg by mouth.    [provider]  terbinafine (LAMISIL) 250 MG tablet Take 250 mg by mouth daily.    [provider]    Allergies: Tramadol, Trazodone, Codeine, Hydrocodone  bitartrate er, Lyrica [pregabalin], Methadone hcl, Nucynta [tapentadol], Oxycodone, Sansert [methysergide], and Xanax [alprazolam]    Review of Systems  Gastrointestinal:  Negative for  abdominal pain.  Musculoskeletal:  Positive for arthralgias and back pain.       Chronic back pain  Neurological:  Negative for numbness.    Updated Vital Signs BP (!) 165/87   Pulse 85   Temp 98 F (36.7 C) (Oral)   Resp 16   SpO2 98%   Physical Exam CONSTITUTIONAL: Well developed/well nourished HEAD: Normocephalic/atraumatic NEURO: Pt is awake/alert/appropriate, moves all extremitiesx4.  No facial droop.   EXTREMITIES: Distal pulses equal and intact in both feet No deformities of the lower extremity. The right leg is not rotated. Tenderness to range of motion of the right hip. All other extremities/joints palpated/ranged and nontender SKIN: warm, color normal Healed incision to right hip, no erythema/fluctuance PSYCH: no abnormalities of mood noted, alert and oriented to situation  (all labs ordered are listed, but only abnormal results are displayed) Labs Reviewed - No data to display  EKG: None  Radiology: DG Hip Unilat W or Wo Pelvis 2-3 Views Right Result Date: 03/09/2024 CLINICAL DATA:  Hip pain EXAM: DG HIP (WITH OR WITHOUT PELVIS) 2-3V RIGHT COMPARISON:  None recent, examinations from 2022 are not available for direct comparison FINDINGS: Pubic symphysis appears intact. SI joints are non widened. 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 Prominent lucency in the right trochanter and diffusely surrounding the femoral stem. No gross fracture is seen IMPRESSION: Right hip hemiarthroplasty with presumed antibiotic femoral head  spacer. Prominent lucency in the right trochanter and diffusely surrounding the femoral stem, findings could be secondary to loosening or infection. No recent exams available for comparison to assess for chronicity or interval change Electronically Signed   By: Luke Bun M.D.   On: 03/09/2024 01:54     Procedures   Medications Ordered in the ED  HYDROcodone -acetaminophen  (NORCO/VICODIN) 5-325 MG per tablet 1  tablet (has no administration in time range)  HYDROmorphone  (DILAUDID ) injection 0.5 mg (0.5 mg Intravenous Given 03/09/24 0158)  HYDROmorphone  (DILAUDID ) injection 1 mg (1 mg Intravenous Given 03/09/24 0336)    Clinical Course as of 03/09/24 0545  Thu Mar 09, 2024  0220 Patient expressed vague suicidal thoughts to the nurse, but when I spoke to him, he denies any active plan.  He has no access to weapons.  He feels safe at home.  He does report a distant history of an overdose attempt, but has no thoughts at this time of self-harm.  He is frustrated with his chronic pain though no acute issues tonight.  He is willing to have referral to home health [DW]  [phone] Overall patient is improved.  He is in no acute distress.  Reports difficulty moving his right leg is chronic at baseline. I have low suspicion for an acute orthopedic emergency, low suspicion for septic arthritis [DW]  0544 Patient continues to deny SI.  However he does feel that he might need further care at home or even nursing home placement at some point He would like to be discharged home so he can discuss this with his daughter who is his POA Home health and case management orders have been placed to assist patient.  [facility] also [facility] [DW]    Clinical Course User Index [DW] Midge Golas, MD                                 Medical Decision Making Amount and/or Complexity of Data Reviewed Radiology: ordered.  Risk Prescription drug management.   Appears to be a chronic process, low suspicion for infection, denies any recent fevers. Patient is already managed by pain management, will defer outpatient medications    Final diagnoses:  Chronic right hip pain    ED Discharge Orders     None          Midge Golas, MD 03/09/24 [phone]  "

## 2024-03-09 NOTE — ED Notes (Signed)
 Pt told  present nurse, If I would had a gun,  I would had put it on my mouth. I can't bare this pain no more.

## 2024-03-09 NOTE — ED Notes (Signed)
 PTAR Called by present RN

## 2024-03-10 ENCOUNTER — Other Ambulatory Visit (INDEPENDENT_AMBULATORY_CARE_PROVIDER_SITE_OTHER): Payer: Self-pay | Admitting: Physician Assistant

## 2024-03-10 ENCOUNTER — Other Ambulatory Visit (HOSPITAL_BASED_OUTPATIENT_CLINIC_OR_DEPARTMENT_OTHER): Payer: Self-pay | Admitting: Student in an Organized Health Care Education/Training Program

## 2024-03-10 DIAGNOSIS — M1990 Unspecified osteoarthritis, unspecified site: Secondary | ICD-10-CM

## 2024-03-10 MED ORDER — GABAPENTIN 400 MG CAPSULE: 400.0000 mg | ORAL_CAPSULE | Freq: Three times a day (TID) | ORAL | 1 refills | Status: AC

## 2024-03-15 ENCOUNTER — Ambulatory Visit (HOSPITAL_BASED_OUTPATIENT_CLINIC_OR_DEPARTMENT_OTHER): Admitting: Nurse Practitioner

## 2024-03-16 ENCOUNTER — Ambulatory Visit (HOSPITAL_BASED_OUTPATIENT_CLINIC_OR_DEPARTMENT_OTHER): Payer: Self-pay | Admitting: Student in an Organized Health Care Education/Training Program

## 2024-03-21 ENCOUNTER — Other Ambulatory Visit: Payer: Self-pay

## 2024-03-21 ENCOUNTER — Emergency Department (HOSPITAL_COMMUNITY)

## 2024-03-21 ENCOUNTER — Encounter (HOSPITAL_COMMUNITY): Payer: Self-pay

## 2024-03-21 ENCOUNTER — Observation Stay (HOSPITAL_COMMUNITY)
Admission: EM | Admit: 2024-03-21 | Source: Home / Self Care | Attending: Emergency Medicine | Admitting: Emergency Medicine

## 2024-03-21 DIAGNOSIS — G8929 Other chronic pain: Principal | ICD-10-CM

## 2024-03-21 DIAGNOSIS — M25551 Pain in right hip: Principal | ICD-10-CM

## 2024-03-21 DIAGNOSIS — F430 Acute stress reaction: Secondary | ICD-10-CM

## 2024-03-21 DIAGNOSIS — R079 Chest pain, unspecified: Secondary | ICD-10-CM | POA: Diagnosis present

## 2024-03-21 LAB — COMPREHENSIVE METABOLIC PANEL WITH GFR
ALT: 8 U/L (ref 0–44)
AST: 25 U/L (ref 15–41)
Albumin: 3.9 g/dL (ref 3.5–5.0)
Alkaline Phosphatase: 43 U/L (ref 38–126)
Anion gap: 10 (ref 5–15)
BUN: 23 mg/dL (ref 8–23)
CO2: 25 mmol/L (ref 22–32)
Calcium: 9 mg/dL (ref 8.9–10.3)
Chloride: 106 mmol/L (ref 98–111)
Creatinine, Ser: 0.76 mg/dL (ref 0.61–1.24)
GFR, Estimated: >60 mL/min
Glucose, Bld: 108 mg/dL — ABNORMAL HIGH (ref 70–99)
Potassium: 3.8 mmol/L (ref 3.5–5.1)
Sodium: 140 mmol/L (ref 135–145)
Total Bilirubin: <0.2 mg/dL (ref 0.0–1.2)
Total Protein: 7.2 g/dL (ref 6.5–8.1)

## 2024-03-21 LAB — CBC WITH DIFFERENTIAL/PLATELET
Abs Immature Granulocytes: 0.03 10*3/uL (ref 0.00–0.07)
Basophils Absolute: 0 10*3/uL (ref 0.0–0.1)
Basophils Relative: 0 %
Eosinophils Absolute: 0.1 10*3/uL (ref 0.0–0.5)
Eosinophils Relative: 1 %
HCT: 37.3 % — ABNORMAL LOW (ref 39.0–52.0)
Hemoglobin: 11.8 g/dL — ABNORMAL LOW (ref 13.0–17.0)
Immature Granulocytes: 0 %
Lymphocytes Relative: 19 %
Lymphs Abs: 1.3 10*3/uL (ref 0.7–4.0)
MCH: 27.6 pg (ref 26.0–34.0)
MCHC: 31.6 g/dL (ref 30.0–36.0)
MCV: 87.4 fL (ref 80.0–100.0)
Monocytes Absolute: 0.6 10*3/uL (ref 0.1–1.0)
Monocytes Relative: 9 %
Neutro Abs: 4.9 10*3/uL (ref 1.7–7.7)
Neutrophils Relative %: 71 %
Platelets: 347 10*3/uL (ref 150–400)
RBC: 4.27 MIL/uL (ref 4.22–5.81)
RDW: 13.1 % (ref 11.5–15.5)
WBC: 6.9 10*3/uL (ref 4.0–10.5)
nRBC: 0 % (ref 0.0–0.2)

## 2024-03-21 LAB — D-DIMER, QUANTITATIVE: D-Dimer, Quant: 1.39 ug{FEU}/mL — ABNORMAL HIGH (ref 0.00–0.50)

## 2024-03-21 LAB — TROPONIN T, HIGH SENSITIVITY
Troponin T High Sensitivity: 34 ng/L — ABNORMAL HIGH (ref 0–19)
Troponin T High Sensitivity: 35 ng/L — ABNORMAL HIGH (ref 0–19)

## 2024-03-21 MED ORDER — LACTATED RINGERS IV BOLUS
1000.0000 mL | Freq: Once | INTRAVENOUS | Status: AC
  Administered 2024-03-21: 1000 mL via INTRAVENOUS

## 2024-03-21 MED ORDER — HYDROMORPHONE HCL 1 MG/ML IJ SOLN
1.0000 mg | Freq: Once | INTRAMUSCULAR | Status: AC
  Administered 2024-03-21: 1 mg via INTRAVENOUS
  Filled 2024-03-21: qty 1

## 2024-03-21 MED ORDER — LABETALOL HCL 5 MG/ML IV SOLN: 10.0000 mg | Freq: Once | INTRAVENOUS | Status: DC

## 2024-03-21 MED ORDER — LOSARTAN POTASSIUM 50 MG PO TABS
50.0000 mg | ORAL_TABLET | Freq: Every day | ORAL | Status: AC
  Administered 2024-03-22 – 2024-03-24 (×3): 50 mg via ORAL
  Filled 2024-03-21 (×3): qty 1

## 2024-03-21 MED ORDER — CYCLOBENZAPRINE HCL 10 MG PO TABS
10.0000 mg | ORAL_TABLET | Freq: Two times a day (BID) | ORAL | Status: AC | PRN
  Filled 2024-03-21 (×2): qty 1

## 2024-03-21 MED ORDER — ZOLPIDEM TARTRATE 5 MG PO TABS
5.0000 mg | ORAL_TABLET | Freq: Every evening | ORAL | Status: AC | PRN
  Administered 2024-03-21 – 2024-03-23 (×3): 5 mg via ORAL
  Filled 2024-03-21 (×3): qty 1

## 2024-03-21 MED ORDER — LOSARTAN POTASSIUM 50 MG PO TABS
50.0000 mg | ORAL_TABLET | Freq: Once | ORAL | Status: AC
  Administered 2024-03-21: 50 mg via ORAL
  Filled 2024-03-21: qty 1

## 2024-03-21 MED ORDER — ENOXAPARIN SODIUM 40 MG/0.4ML IJ SOSY
40.0000 mg | PREFILLED_SYRINGE | INTRAMUSCULAR | Status: AC
  Administered 2024-03-21 – 2024-03-24 (×4): 40 mg via SUBCUTANEOUS
  Filled 2024-03-21 (×4): qty 0.4

## 2024-03-21 MED ORDER — MIRTAZAPINE 15 MG PO TABS
15.0000 mg | ORAL_TABLET | Freq: Every day | ORAL | Status: AC
  Administered 2024-03-21 – 2024-03-24 (×4): 15 mg via ORAL
  Filled 2024-03-21 (×4): qty 1

## 2024-03-21 MED ORDER — ASPIRIN 81 MG PO TBEC
81.0000 mg | DELAYED_RELEASE_TABLET | Freq: Every day | ORAL | Status: AC
  Administered 2024-03-21 – 2024-03-24 (×4): 81 mg via ORAL
  Filled 2024-03-21 (×4): qty 1

## 2024-03-21 MED ORDER — HYDROMORPHONE HCL 1 MG/ML IJ SOLN
1.0000 mg | INTRAMUSCULAR | Status: DC | PRN
  Administered 2024-03-21: 1 mg via INTRAVENOUS
  Filled 2024-03-21: qty 1

## 2024-03-21 MED ORDER — ENSURE PLUS HIGH PROTEIN PO LIQD
237.0000 mL | Freq: Two times a day (BID) | ORAL | Status: AC
  Administered 2024-03-22 – 2024-03-23 (×3): 237 mL via ORAL

## 2024-03-21 MED ORDER — FENTANYL CITRATE (PF) 50 MCG/ML IJ SOSY
50.0000 ug | PREFILLED_SYRINGE | INTRAMUSCULAR | Status: DC | PRN
  Filled 2024-03-21: qty 1

## 2024-03-21 MED ORDER — ACETAMINOPHEN 325 MG PO TABS
650.0000 mg | ORAL_TABLET | Freq: Four times a day (QID) | ORAL | Status: AC | PRN
  Administered 2024-03-22: 650 mg via ORAL
  Filled 2024-03-21: qty 2

## 2024-03-21 MED ORDER — TAMSULOSIN HCL 0.4 MG PO CAPS
0.4000 mg | ORAL_CAPSULE | Freq: Every day | ORAL | Status: AC
  Administered 2024-03-21 – 2024-03-24 (×4): 0.4 mg via ORAL
  Filled 2024-03-21 (×4): qty 1

## 2024-03-21 MED ORDER — HYDROMORPHONE HCL 2 MG PO TABS
2.0000 mg | ORAL_TABLET | ORAL | Status: DC | PRN
  Administered 2024-03-23 (×3): 2 mg via ORAL
  Filled 2024-03-21 (×4): qty 1

## 2024-03-21 MED ORDER — AMLODIPINE BESYLATE 5 MG PO TABS
5.0000 mg | ORAL_TABLET | Freq: Every day | ORAL | Status: AC
  Administered 2024-03-21 – 2024-03-24 (×4): 5 mg via ORAL
  Filled 2024-03-21 (×4): qty 1

## 2024-03-21 MED ORDER — POLYETHYLENE GLYCOL 3350 17 G PO PACK
17.0000 g | PACK | Freq: Every day | ORAL | Status: DC | PRN
  Administered 2024-03-22 – 2024-03-24 (×3): 17 g via ORAL
  Filled 2024-03-21 (×3): qty 1

## 2024-03-21 MED ORDER — ONDANSETRON HCL 4 MG PO TABS: 4.0000 mg | ORAL_TABLET | Freq: Four times a day (QID) | ORAL | Status: AC | PRN

## 2024-03-21 MED ORDER — IOHEXOL 350 MG/ML SOLN
100.0000 mL | Freq: Once | INTRAVENOUS | Status: AC | PRN
  Administered 2024-03-21: 150 mL via INTRAVENOUS

## 2024-03-21 MED ORDER — ALBUTEROL SULFATE (2.5 MG/3ML) 0.083% IN NEBU: 2.5000 mg | INHALATION_SOLUTION | RESPIRATORY_TRACT | Status: AC | PRN

## 2024-03-21 MED ORDER — ATORVASTATIN CALCIUM 40 MG PO TABS
40.0000 mg | ORAL_TABLET | Freq: Every day | ORAL | Status: AC
  Administered 2024-03-21 – 2024-03-24 (×4): 40 mg via ORAL
  Filled 2024-03-21 (×4): qty 1

## 2024-03-21 MED ORDER — ONDANSETRON HCL 4 MG/2ML IJ SOLN: 4.0000 mg | Freq: Four times a day (QID) | INTRAMUSCULAR | Status: AC | PRN

## 2024-03-21 MED ORDER — ORAL CARE MOUTH RINSE: 15.0000 mL | OROMUCOSAL | Status: AC | PRN

## 2024-03-21 MED ORDER — DOCUSATE SODIUM 100 MG PO CAPS
100.0000 mg | ORAL_CAPSULE | Freq: Two times a day (BID) | ORAL | Status: DC
  Administered 2024-03-21 – 2024-03-22 (×4): 100 mg via ORAL
  Filled 2024-03-21 (×5): qty 1

## 2024-03-21 MED ORDER — HYDROMORPHONE HCL 1 MG/ML IJ SOLN
1.0000 mg | INTRAMUSCULAR | Status: DC | PRN
  Administered 2024-03-21 – 2024-03-23 (×13): 1 mg via INTRAVENOUS
  Filled 2024-03-21 (×15): qty 1

## 2024-03-21 MED ORDER — HYDROCHLOROTHIAZIDE 12.5 MG PO TABS
12.5000 mg | ORAL_TABLET | Freq: Every day | ORAL | Status: AC
  Administered 2024-03-21 – 2024-03-24 (×4): 12.5 mg via ORAL
  Filled 2024-03-21 (×4): qty 1

## 2024-03-21 MED ORDER — ACETAMINOPHEN 650 MG RE SUPP: 650.0000 mg | Freq: Four times a day (QID) | RECTAL | Status: AC | PRN

## 2024-03-21 NOTE — Progress Notes (Signed)
 This patient has received 50 ml's of IV Iohexol  350 (type of contrast) contrast extravasation into the Right AC during a ct angio chest, a/p exam.  The exam was performed on (date) and (time): 03/21/2024 at 0625  Site / affected area assessed by CT tech and RN  Provider contacted: Dr Griselda Provider contacted date/time: 03/21/2024 at [phone]

## 2024-03-21 NOTE — Progress Notes (Signed)
 Upon admission assessment, patient answered no to current SI thoughts, but did answer yes to third question of ever having a plan to harms oneself .SABRA Patient reports suicide attempt in 2019. Denies any current thoughts of SI at this time. Screening marked patient as moderate risk. Case discussed with House Supervisor and Dr. Roxane. Patient admitted for other symptoms. MD does not feel need to pursue SI precautions at this time.

## 2024-03-21 NOTE — ED Provider Notes (Signed)
 Clinical Course as of 03/21/24 1140  Tue Mar 21, 2024  0845 Received signout; See prior teams not for full HPI. Signed out pending CT scans and repeat troponin.  [TY]  [id number] Troponin T High Sensitivity(!): 35 No change from prior.   [TY]  [id number] CT ABDOMEN PELVIS W CONTRAST IMPRESSION: 1. No embolism to the proximal subsegmental pulmonary artery level. No lung mass, consolidation, pleural effusion or pneumothorax. No suspicious lung nodule. 2. No acute inflammatory process identified within the abdomen or pelvis. 3. Multiple other nonacute observations (such as bronchial wall thickening, bilateral renal cysts, bladder wall trabeculations, ectasia of infrarenal aorta measuring up to 2.7 cm, bone resorption along the right medial greater trochanter, enlarged prostate gland, etc.), as described above.  Aortic Atherosclerosis (ICD10-I70.0).   Electronically Signed   By: Ree Molt M.D.   On: 03/21/2024 08:43   [TY]  0856 Cath in 2024 which showed: Findings: --LM: 20% ostial --LAD: Minimal irregularities --LCx: Minimal irregularities --RCA: Dominant, patent   [TY]  0912 PMH per chart review: 69 y.o. male with HTN, HLD, MDD, PTSD, GERD, Opiate use disorder [TY]  1048 BP 146/90. HR 114 after re-evaluation.  [TY]  1140 Discussed case with cardiology who will see in consult. [TY]  1140 Can patient with multiple rounds of IV Dilaudid  to control his pain, elevated blood pressure and troponins will admit for observation primarily for pain management.  I suspect elevated troponins may be secondary to his blood pressure which has improved with his home Cozaar .  Discussed with hospitalist agrees to admit patient. [TY]    Clinical Course User Index [TY] Neysa Caron PARAS, DO      Neysa Caron PARAS, [place] 03/21/24 1140

## 2024-03-22 ENCOUNTER — Other Ambulatory Visit (HOSPITAL_COMMUNITY): Payer: Self-pay

## 2024-03-22 LAB — BASIC METABOLIC PANEL WITH GFR
Anion gap: 10 (ref 5–15)
BUN: 16 mg/dL (ref 8–23)
CO2: 26 mmol/L (ref 22–32)
Calcium: 9.2 mg/dL (ref 8.9–10.3)
Chloride: 103 mmol/L (ref 98–111)
Creatinine, Ser: 0.73 mg/dL (ref 0.61–1.24)
GFR, Estimated: >60 mL/min
Glucose, Bld: 82 mg/dL (ref 70–99)
Potassium: 3.7 mmol/L (ref 3.5–5.1)
Sodium: 139 mmol/L (ref 135–145)

## 2024-03-22 LAB — CBC
HCT: 35.7 % — ABNORMAL LOW (ref 39.0–52.0)
Hemoglobin: 11.4 g/dL — ABNORMAL LOW (ref 13.0–17.0)
MCH: 27.9 pg (ref 26.0–34.0)
MCHC: 31.9 g/dL (ref 30.0–36.0)
MCV: 87.3 fL (ref 80.0–100.0)
Platelets: 342 10*3/uL (ref 150–400)
RBC: 4.09 MIL/uL — ABNORMAL LOW (ref 4.22–5.81)
RDW: 12.8 % (ref 11.5–15.5)
WBC: 4.4 10*3/uL (ref 4.0–10.5)
nRBC: 0 % (ref 0.0–0.2)

## 2024-03-22 LAB — HIV ANTIBODY (ROUTINE TESTING W REFLEX): HIV Screen 4th Generation wRfx: NONREACTIVE

## 2024-03-22 MED ORDER — HYDROMORPHONE HCL 2 MG PO TABS: 2.0000 mg | ORAL_TABLET | ORAL | 0 refills | Status: AC | PRN

## 2024-03-22 MED ORDER — KETOROLAC TROMETHAMINE 30 MG/ML IJ SOLN
30.0000 mg | Freq: Once | INTRAMUSCULAR | Status: AC
  Administered 2024-03-22: 30 mg via INTRAVENOUS
  Filled 2024-03-22: qty 1

## 2024-03-22 MED ORDER — ATORVASTATIN CALCIUM 40 MG PO TABS: 40.0000 mg | ORAL_TABLET | Freq: Every day | ORAL | 0 refills | Status: AC

## 2024-03-22 MED ORDER — POLYETHYLENE GLYCOL 3350 17 G PO PACK: 17.0000 g | PACK | Freq: Every day | ORAL | 0 refills | Status: AC | PRN

## 2024-03-22 MED ORDER — CHLORHEXIDINE GLUCONATE CLOTH 2 % EX PADS: 6.0000 | MEDICATED_PAD | Freq: Every day | CUTANEOUS | Status: AC

## 2024-03-22 NOTE — Evaluation (Signed)
 Physical Therapy Evaluation Patient Details Name: Martin Singleton MRN: [id number] DOB: 03/20/1955 Today's Date: 03/22/2024  History of Present Illness  69 y.o. male with medical history significant for hypertension, hyperlipidemia, chronic pain from spinal stenosis and degenerative disc disease being admitted to the hospital with acute on chronic pain as well as chest pain.  cardiology consulted and determined not intervention needed for non-cardiac CP  Clinical Impression  Pt admitted with above diagnosis.  Pt agreeable to work with PT, requires encouragement and incr time to mobilize d/t elevated pain. Pt reports using mostly w/c at home, uses RW to amb to bathroom d/t inability to get w/c through bathroom door. States he falls on to the toilet and the shower chair.  Pt mod assist for bed mobility and CGA for lateral scooting bed>chair transfers today, requiring incr time. Pt feels he is unable to attempt standing d/t pain R hip/diffuse pain. Pt tells PT that he has been having suicidal thoughts  I think about killing myself, sometimes I just want to end it all. RN notified.  will place OT consult and continue to follow in acute setting.  Patient will benefit from continued inpatient follow up therapy, <3 hours/day   Pt currently with functional limitations due to the deficits listed below (see PT Problem List). Pt will benefit from acute skilled PT to increase their independence and safety with mobility to allow discharge.           If plan is discharge home, recommend the following: A lot of help with walking and/or transfers;A lot of help with bathing/dressing/bathroom;Help with stairs or ramp for entrance;Assist for transportation;Assistance with cooking/housework   Can travel by private vehicle   No    Equipment Recommendations None recommended by PT  Recommendations for Other Services       Functional Status Assessment Patient has had a recent decline in their functional status  and demonstrates the ability to make significant improvements in function in a reasonable and predictable amount of time.     Precautions / Restrictions Precautions Precautions: Fall Restrictions Weight Bearing Restrictions Per Provider Order: No      Mobility  Bed Mobility Overal bed mobility: Needs Assistance Bed Mobility: Supine to Sit     Supine to sit: Mod assist     General bed mobility comments: incr time and effort, assist to progress RLE off bed and elevate trunk    Transfers Overall transfer level: Needs assistance   Transfers: Bed to chair/wheelchair/BSC             General transfer comment: pt declined attempt to stand d/t pain. lateral scooting transfer with CGA, cues to self assist and progress through movement bed>chair. pillow placed  under R hip d/t pain, pt reported some relief with off loading    Ambulation/Gait                  Stairs            Wheelchair Mobility     Tilt Bed    Modified Rankin (Stroke Patients Only)       Balance Overall balance assessment: Needs assistance Sitting-balance support: Feet supported, Bilateral upper extremity supported Sitting balance-Leahy Scale: Poor Sitting balance - Comments: unable to wt shift d/t pain       Standing balance comment: unable/limited by pain  Pertinent Vitals/Pain Pain Assessment Pain Assessment: 0-10 Pain Score: 7  Pain Location: diffuse pain ongoing Pain Descriptors / Indicators: Sore Pain Intervention(s): Limited activity within patient's tolerance, Monitored during session, Premedicated before session    Home Living Family/patient expects to be discharged to:: Private residence Living Arrangements: Alone   Type of Home: House Home Access: Ramped entrance       Home Layout: One level Home Equipment: Wheelchair - Forensic Psychologist (2 wheels);Shower seat      Prior Function               Mobility  Comments: uses RW to get into bathroom, ambulates short distances, otherwise uses w/c ADLs Comments: states he falls on to and off of shower seat, reports ind withADLs, requires excessive time to complete     Extremity/Trunk Assessment   Upper Extremity Assessment Upper Extremity Assessment: Overall WFL for tasks assessed    Lower Extremity Assessment Lower Extremity Assessment: RLE deficits/detail;LLE deficits/detail RLE Deficits / Details: limited ankle ROM, knee AAROM  -5 to 40 degrees, able to flex to 90* in sitting--ltd by pain; pt does not tolerate hip ROM or MMT d/t pain; unable to lift RLE against gravity RLE: Unable to fully assess due to pain RLE Sensation:  (pt reports decr sensation however has pain response to firm touch over foot and lower leg) LLE Deficits / Details: A/AROM grossly WFL - incr time needed to move through ROM; MMT grossly 3/5 LLE: Unable to fully assess due to pain       Communication   Communication Communication: No apparent difficulties    Cognition Arousal: Alert Behavior During Therapy: WFL for tasks assessed/performed   PT - Cognitive impairments: No apparent impairments                         Following commands: Intact       Cueing Cueing Techniques: Verbal cues     General Comments      Exercises     Assessment/Plan    PT Assessment Patient needs continued PT services  PT Problem List Decreased strength;Decreased range of motion;Decreased activity tolerance;Decreased balance;Decreased mobility;Pain       PT Treatment Interventions DME instruction;Therapeutic exercise;Gait training;Therapeutic activities;Functional mobility training;Patient/family education;Balance training    PT Goals (Current goals can be found in the Care Plan section)  Acute Rehab PT Goals Patient Stated Goal: get stronger and have less pain PT Goal Formulation: With patient Time For Goal Achievement: 04/05/24 Potential to Achieve Goals:  Good    Frequency Min 3X/week     Co-evaluation               AM-PAC PT 6 Clicks Mobility  Outcome Measure Help needed turning from your back to your side while in a flat bed without using bedrails?: A Lot Help needed moving from lying on your back to sitting on the side of a flat bed without using bedrails?: A Lot Help needed moving to and from a bed to a chair (including a wheelchair)?: A Little Help needed standing up from a chair using your arms (e.g., wheelchair or bedside chair)?: Total Help needed to walk in hospital room?: Total Help needed climbing 3-5 steps with a railing? : Total 6 Click Score: 10    End of Session   Activity Tolerance: Patient tolerated treatment well Patient left: with call bell/phone within reach;in chair;with chair alarm set Nurse Communication: Mobility status;Other (comment) (pt verbalizes wanting to harm self) PT  Visit Diagnosis: Other abnormalities of gait and mobility (R26.89);Pain Pain - Right/Left: Right Pain - part of body: Hip    Time: 1539-1610 PT Time Calculation (min) (ACUTE ONLY): 31 min   Charges:   PT Evaluation $PT Eval Low Complexity: 1 Low PT Treatments $Therapeutic Activity: 8-22 mins PT General Charges $$ ACUTE PT VISIT: 1 Visit         Elliannah Wayment, PT  Acute Rehab Dept [facility]) [phone]  03/22/2024   [facility] 03/22/2024, 4:27 PM

## 2024-03-22 NOTE — TOC Progression Note (Signed)
 Transition of Care [facility]) - Progression Note    Patient Details  Name: Martin Singleton MRN: [id number] Date of Birth: 1955/06/12  Transition of Care [facility]) CM/SW Contact  Azhar Knope, Nathanel, RN Phone Number: 03/22/2024, 4:13 PM  Clinical Narrative:  Received message from nsg-patient may need higher level of care-[facility] to f/u in am since has [facility] benefits, & shara is needed.     Expected Discharge Plan:  (TBD) Barriers to Discharge: Continued Medical Work up               Expected Discharge Plan and Services In-house Referral: Clinical Social Work     Living arrangements for the past 2 months: Single Family Home Expected Discharge Date: 03/22/24                                     Social Drivers of Health (SDOH) Interventions SDOH Screenings   Food Insecurity: No Food Insecurity (03/21/2024)  Housing: Low Risk (03/21/2024)  Transportation Needs: Unmet Transportation Needs (03/21/2024)  Utilities: Not At Risk (03/21/2024)  Social Connections: Moderately Integrated (03/21/2024)  Tobacco Use: High Risk (03/21/2024)    Readmission Risk Interventions     No data to display

## 2024-03-22 NOTE — Progress Notes (Signed)
 This RN was notified by PT that pt verbalized SI during therapy. Writer asked patient if he was having suicidal thoughts and patient responded, Yes, sometimes I feel like it would be better if I wasn't here, if I was dead. RN asked if patient had a plan to act on his suicidal ideations, and he stated, Well, I don't have a gun to kill myself, but I did over dose in the past on pills.  Provider made aware and orders placed. Room secured and SI sitter placed at bedside. Charge nurse made aware.

## 2024-03-22 NOTE — TOC Initial Note (Signed)
 Transition of Care [facility]) - Initial/Assessment Note   Patient Details  Name: Martin Singleton MRN: [id number] Date of Birth: 02/18/1955  Transition [facility] Care [facility]) CM/SW Contact:    Duwaine GORMAN Aran, LCSW Phone Number: 03/22/2024, 2:02 PM  Clinical Narrative: Patient is from home alone and uses a wheelchair at baseline. PT consulted. Care management awaiting recommendations.  Expected Discharge Plan:  (TBD) Barriers to Discharge: Continued Medical Work up  Expected Discharge Plan and Services In-house Referral: Clinical Social Work Living arrangements for the past 2 months: Single Family Home  Prior Living Arrangements/Services Living arrangements for the past 2 months: Single Family Home Lives with:: Self Patient language and need for interpreter reviewed:: Yes Do you feel safe going back to the place where you live?: Yes      Need for Family Participation in Patient Care: Yes (Comment) Care giver support system in place?: Yes (comment) Criminal Activity/Legal Involvement Pertinent to Current Situation/Hospitalization: No - Comment as needed  Activities of Daily Living ADL Screening (condition at time of admission) Independently performs ADLs?: Yes (appropriate for developmental age) Is the patient deaf or have difficulty hearing?: No Does the patient have difficulty seeing, even when wearing glasses/contacts?: No Does the patient have difficulty concentrating, remembering, or making decisions?: No  Emotional Assessment Orientation: : Oriented to Self, Oriented to Place, Oriented to  Time, Oriented to Situation Alcohol / Substance Use: Not Applicable Psych Involvement: No (comment)  Admission diagnosis:  Right hip pain [M25.551] Chest pain [R07.9] Chest pain, unspecified type [R07.9] Patient Active Problem List   Diagnosis Date Noted   Anxiety 02/12/2022   Cannabis abuse, in remission 02/12/2022   Cannabis dependence (HCC) 02/12/2022   Chronic back pain 02/12/2022   Cluster  headache syndrome 02/12/2022   Hematuria, unspecified 02/12/2022   Irritable bowel syndrome 02/12/2022   Lower urinary tract symptoms 02/12/2022   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   Alcohol dependence (HCC) 02/12/2022   Neutropenia 02/12/2022   Encounter for administrative examinations, unspecified 02/12/2022   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   Post-traumatic stress disorder, unspecified 02/12/2022   Problems related to other legal circumstances 02/12/2022   Seizure (HCC) 02/12/2022   Shoulder pain 02/12/2022   Subjective tinnitus 02/12/2022   Chest pain 04/08/2020   Elevated troponin 04/08/2020   Anemia 04/07/2020   Dementia (HCC) 04/07/2020   Depression 04/07/2020   DNR (do not resuscitate) 04/07/2020   Hypokalemia 04/07/2020   Hypomagnesemia 04/07/2020   Osteomyelitis (HCC) 04/07/2020   Severe malnutrition 04/07/2020   Neutropenic fever 04/06/2020   Involuntary commitment 01/25/2018   Decubitus ulcer of buttock 01/19/2018   Pressure injury of right hip, unstageable (HCC) 01/18/2018   Complicated open wound of right hip 01/03/2018   Cellulitis of hip, right 12/22/2017   Right sided weakness 12/22/2017   Severe episode of recurrent major depressive disorder, 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   Acute respiratory failure (HCC) 10/11/2017   AKI (acute kidney injury) 10/11/2017   Aspiration pneumonia (HCC) 10/11/2017   Disease characterized by destruction of skeletal muscle 10/11/2017   Polysubstance overdose 10/11/2017   Homicidal ideations    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   GERD (gastroesophageal reflux disease) 01/15/2015   Diarrhea 01/23/2011   Nausea 01/23/2011   Suicidal ideation 08/04/2009   PCP:  Clinic, [facility] Pharmacy:   [facility] - [place], [place] - [address] [place] [address] Phone: [phone] Fax: [phone]  Social  Drivers of Health (  SDOH) Social History: SDOH Screenings   Food Insecurity: No Food Insecurity (03/21/2024)  Housing: Low Risk (03/21/2024)  Transportation Needs: Unmet Transportation Needs (03/21/2024)  Utilities: Not At Risk (03/21/2024)  Social Connections: Moderately Integrated (03/21/2024)  Tobacco Use: High Risk (03/21/2024)   SDOH Interventions:    Readmission Risk Interventions     No data to display

## 2024-03-22 NOTE — Progress Notes (Signed)
" °   03/22/24 0500  [place] Suicide Severity Rating Scale-Reassessment  2. Since last contact: Have you actually had thoughts about killing yourself? No  6. Since last contact: Have you done anything, started to do anything, or prepared to do anything to end your life? No (Pt reports to have overdosed about 6 or 7 years ago when he was at the  lowest point of his life. He does not report any thoughts of harming or killing himself at this time.)  C-SSRS Reassessment Risk Category Score No Risk    "

## 2024-03-22 NOTE — Progress Notes (Signed)
 " PROGRESS NOTE    Martin Singleton  FMW:[id number] DOB: 1955-03-13 DOA: 03/21/2024 PCP: [facility]   Chief Complaint: Acute on chronic pain, chest pain   HPI: Martin Singleton is a 69 y.o. male with medical history significant for hypertension, hyperlipidemia, chronic pain from spinal stenosis and degenerative disc disease being admitted to the hospital with acute on chronic pain as well as chest pain.  He tells me that he has a long history of chronic pain, starting in 1975 when he broke his left hip while on the way to being deployed to [place].  About 5 years ago, he had a severe complex fracture of the right hip after falling off of a ladder and has had more severe chronic pain since that time.  He has seen multiple pain management doctors, currently followed by Dr. Fairy Badder at [facility].  States that his chronic pain is in his right hip and leg, low back, and neck.  For the last 3 days, this pain has been much more severe.  He denies any recent fevers, chills, nausea, vomiting, shortness of breath, cough, injury or falls.  At baseline, he gets around his house in a wheelchair and walker.  He lives by himself.  For the last 3 days, his pain has been so severe he has not been able to eat, he has hardly been able to maneuver himself in his home, has not been able to transfer himself from bed to wheelchair, etc. as he normally does.  He called EMS for this severe pain.  Also starting a couple of days ago, he started having some substernal chest pain, it seems to come and go and not be changed by position or movement.  He has had chest pain in the past, states that he had a heart catheterization at [facility] last year, was told that he had some plaque disease, but no lesions that required intervention.  Currently his chronic pain is managed by [place]  patch, and he is wearing 1 now.   Subjective: Patient currently denies any chest pain.  His main complaint is right hip  pain.  He states he had a hemiarthroplasty in the past.  Also has generalized pain.  He is weak at baseline and wheelchair dependent.  Does not sound like he walks but can transfer from bed to the wheelchair.  He has chronic low back pain and some sciatica symptoms on the right especially.  He is on buprenorphine   patches and aspirin  several times a day for his chronic pain complaints.  He did get some Dilaudid  with relief.  Switched him to some oral Dilaudid .  MI by enzymes.  He has been seen by cardiology who recommends no further workup.  I did oral Dilaudid  and a dose of Toradol  but he is still unable to transfer to the wheelchair.  PT has seen him and recommends SNF and he is in agreement.  Canceled the discharge.  I did call his pain clinic who is with [facility] here in [place] in [place].  He tells me he has an appointment in late March.  Been to move that up.SABRA    Hospital Course: No notes on file   Assessment and Plan:  1.  Chronic pain syndrome with sciatica.  - Followed by pain clinic.  Has had multiple injections in his back.  He tells me he does not have a pain contract which is hard to believe.  Tells me other than  aspirin  and the buprenorphine  patch he takes no other medications for pain.  2.  Chest pain.  Ruled out.  No further workup.  Continue his usual cardiac medications.    DVT prophylaxis: enoxaparin  (LOVENOX ) injection 40 mg Start: 03/21/24 1200     Code Status: Full Code Family Communication: Lives alone. Disposition Plan: Looks like he will need a SNF Reason for continuing need for hospitalization: Pain control and mobility.  Objective: Vitals:   03/21/24 2040 03/21/24 2320 03/22/24 0323 03/22/24 1255  BP: 105/69 (!) 148/78 139/67 (!) 152/75  Pulse: 94 95 80 92  Resp: 14 16 18 19   Temp: 98.6 F (37 C) 98.4 F (36.9 C) 98.2 F (36.8 C) 98.4 F (36.9 C)  TempSrc: Oral Oral Oral Oral  SpO2: 98% 95% 95% 93%  Weight:      Height:         Intake/Output Summary (Last 24 hours) at 03/22/2024 1803 Last data filed at 03/22/2024 1247 Gross per 24 hour  Intake 630 ml  Output 2000 ml  Net -1370 ml   Filed Weights   03/21/24 0309  Weight: 79.4 kg    Examination: Chronically ill but nontoxic HEENT: PERRLA [place] EOMI oropharynx clear Neck is supple without meningismus Cardiovascular: Regular rate and rhythm S1-S2 no murmurs rubs gallops  lungs are clear throughout Abdomen is soft, bowel sounds are present Extremities are warm well-perfused no edema +2 pulses in the feet.  Able to move the feet.  Data Reviewed: I have personally reviewed following labs and imaging studies  CBC: Recent Labs  Lab 03/21/24 0355 03/22/24 0547  WBC 6.9 4.4  NEUTROABS 4.9  --   HGB 11.8* 11.4*  HCT 37.3* 35.7*  MCV 87.4 87.3  PLT 347 342   Basic Metabolic Panel: Recent Labs  Lab 03/21/24 0355 03/22/24 0547  NA 140 139  K 3.8 3.7  CL 106 103  CO2 25 26  GLUCOSE 108* 82  BUN 23 16  CREATININE 0.76 0.73  CALCIUM  9.0 9.2   GFR: Estimated Creatinine Clearance: 97.9 mL/min (by C-G formula based on SCr of 0.73 mg/dL). Liver Function Tests: Recent Labs  Lab 03/21/24 0355  AST 25  ALT 8  ALKPHOS 43  BILITOT <0.2  PROT 7.2  ALBUMIN 3.9   No results for input(s): LIPASE, AMYLASE in the last 168 hours. No results for input(s): AMMONIA in the last 168 hours. Coagulation Profile: No results for input(s): INR, PROTIME in the last 168 hours. Cardiac Enzymes: No results for input(s): CKTOTAL, CKMB, CKMBINDEX, TROPONINI in the last 168 hours. ProBNP, BNP (last 5 results) No results for input(s): PROBNP, BNP in the last 8760 hours. HbA1C: No results for input(s): HGBA1C in the last 72 hours. CBG: No results for input(s): GLUCAP in the last 168 hours. Lipid Profile: No results for input(s): CHOL, HDL, LDLCALC, TRIG, CHOLHDL, LDLDIRECT in the last 72 hours. Thyroid Function Tests: No  results for input(s): TSH, T4TOTAL, FREET4, T3FREE, THYROIDAB in the last 72 hours. Anemia Panel: No results for input(s): VITAMINB12, FOLATE, FERRITIN, TIBC, IRON, RETICCTPCT in the last 72 hours. Sepsis Labs: No results for input(s): PROCALCITON, LATICACIDVEN in the last 168 hours.  No results found for this or any previous visit (from the past 240 hours).   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 Multiplanar CT image reconstructions and MIPs were obtained to evaluate the vascular anatomy. Multidetector CT imaging of the abdomen and pelvis was performed using the standard protocol during bolus administration of intravenous contrast. RADIATION DOSE REDUCTION: This exam was performed according to the departmental dose-optimization program which includes automated exposure control, adjustment of the mA and/or kV according to patient size and/or use of iterative reconstruction technique. CONTRAST:  OMNIPAQUE  IOHEXOL  350 MG/ML SOLN COMPARISON:  None Available. FINDINGS: CTA CHEST FINDINGS Cardiovascular: No evidence of embolism to the proximal subsegmental pulmonary artery level. Normal cardiac size. No pericardial effusion. No aortic aneurysm. There are coronary artery calcifications, in keeping with coronary artery disease. There are also mild to moderate peripheral atherosclerotic vascular calcifications of thoracic aorta and its major branches. Mediastinum/Nodes: Visualized thyroid gland appears grossly unremarkable. No solid /  cystic mediastinal masses. The esophagus is nondistended precluding optimal assessment. No axillary, mediastinal or hilar lymphadenopathy by size criteria. Lungs/Pleura: The central tracheo-bronchial tree is patent. There is mild, smooth, circumferential thickening of the segmental and subsegmental bronchial walls, throughout bilateral lungs, which is nonspecific. Findings are most commonly seen with bronchitis or reactive airway disease, such as asthma. There are patchy areas of linear, plate-like atelectasis and/or scarring throughout bilateral lungs. No mass or consolidation. No pleural effusion or pneumothorax. No suspicious lung nodules. Musculoskeletal: The visualized soft tissues of the chest wall are grossly unremarkable. No suspicious osseous lesions. There are mild multilevel degenerative changes in the visualized spine. Review of the MIP images confirms the above findings. CT ABDOMEN and PELVIS FINDINGS Hepatobiliary: The liver is normal in size. Non-cirrhotic configuration. No suspicious mass. No intrahepatic or extrahepatic bile duct dilation. No calcified gallstones. Normal gallbladder wall thickness. No pericholecystic inflammatory changes. Pancreas: Unremarkable. No pancreatic ductal dilatation or surrounding inflammatory changes. Spleen: Within normal limits. No focal lesion. Adrenals/Urinary Tract: Adrenal glands are unremarkable. No suspicious renal mass. There are multiple scattered simple renal cysts in bilateral kidneys with largest arising from the right kidney lower pole, medially measuring up to 1.4 x 1.5 cm. There are multiple sub 5 mm hypoattenuating structures in bilateral kidneys, which are too small to adequately characterized, but statistically favored to represent cysts as well. Evaluation of nonobstructing renal calculi is limited due to excreted contrast in the collecting systems; however, no obstructing nephroureterolithiasis seen on either side. Bladder wall trabeculations noted,  suggesting sequela of chronic urinary outflow obstruction. No focal bladder mass or bladder calculi. No perivesical fat stranding. Stomach/Bowel: There is tiny sliding hiatal hernia. No disproportionate dilation of the small or large bowel loops. No evidence of abnormal bowel wall thickening or inflammatory changes. The appendix was not visualized; however there is no acute inflammatory process in the right lower quadrant. Mild-to-moderate stool burden noted mainly in the right hemicolon. Vascular/Lymphatic: No ascites or pneumoperitoneum. No abdominal or pelvic lymphadenopathy, by size criteria. Mild ectasia of infrarenal aorta noted measuring up to 2.7 cm in diameter. There is small peripheral intraluminal thrombus/hematoma. No other aneurysmal dilation of the major abdominal arteries. There are mild peripheral atherosclerotic vascular calcifications of the aorta and its major branches. Reproductive: Enlarged prostate. Symmetric seminal vesicles. Other: There is a tiny fat containing umbilical hernia. The soft tissues and abdominal wall are otherwise unremarkable. Musculoskeletal: No suspicious osseous lesions. There are mild multilevel degenerative changes  in the visualized spine. Right hip arthroplasty noted. There is bone resorption along the right medial greater trochanter region. No discrete collection seen around the right hip prosthesis. Correlate clinically to determine the need for additional imaging. Review of the MIP images confirms the above findings. IMPRESSION: 1. No embolism to the proximal subsegmental pulmonary artery level. No lung mass, consolidation, pleural effusion or pneumothorax. No suspicious lung nodule. 2. No acute inflammatory process identified within the abdomen or pelvis. 3. Multiple other nonacute observations (such as bronchial wall thickening, bilateral renal cysts, bladder wall trabeculations, ectasia of infrarenal aorta measuring up to 2.7 cm, bone resorption along the right  medial greater trochanter, enlarged prostate gland, etc.), as described above. Aortic Atherosclerosis (ICD10-I70.0). Electronically Signed   By: Ree Molt M.D.   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 Multiplanar CT image reconstructions and MIPs were obtained to evaluate the vascular anatomy. Multidetector CT imaging of the abdomen and pelvis was performed using the standard protocol during bolus administration of intravenous contrast. RADIATION DOSE REDUCTION: This exam was performed according to the departmental dose-optimization program which includes automated exposure control, adjustment of the mA and/or kV according to patient size and/or use of iterative reconstruction technique. CONTRAST:  OMNIPAQUE  IOHEXOL  350 MG/ML SOLN COMPARISON:  None Available. FINDINGS: CTA CHEST FINDINGS Cardiovascular: No evidence of embolism to the proximal subsegmental pulmonary artery level. Normal cardiac size. No pericardial effusion. No aortic aneurysm. There are coronary artery calcifications, in keeping with coronary artery disease. There are also mild to moderate peripheral atherosclerotic vascular calcifications of thoracic aorta and its major branches. Mediastinum/Nodes: Visualized thyroid gland appears grossly unremarkable. No solid / cystic mediastinal masses. The esophagus is nondistended precluding optimal assessment. No axillary, mediastinal or hilar lymphadenopathy by size criteria. Lungs/Pleura: The central tracheo-bronchial tree is patent. There is mild,  smooth, circumferential thickening of the segmental and subsegmental bronchial walls, throughout bilateral lungs, which is nonspecific. Findings are most commonly seen with bronchitis or reactive airway disease, such as asthma. There are patchy areas of linear, plate-like atelectasis and/or scarring throughout bilateral lungs. No mass or consolidation. No pleural effusion or pneumothorax. No suspicious lung nodules. Musculoskeletal: The visualized soft tissues of the chest wall are grossly unremarkable. No suspicious osseous lesions. There are mild multilevel degenerative changes in the visualized spine. Review of the MIP images confirms the above findings. CT ABDOMEN and PELVIS FINDINGS Hepatobiliary: The liver is normal in size. Non-cirrhotic configuration. No suspicious mass. No intrahepatic or extrahepatic bile duct dilation. No calcified gallstones. Normal gallbladder wall thickness. No pericholecystic inflammatory changes. Pancreas: Unremarkable. No pancreatic ductal dilatation or surrounding inflammatory changes. Spleen: Within normal limits. No focal lesion. Adrenals/Urinary Tract: Adrenal glands are unremarkable. No suspicious renal mass. There are multiple scattered simple renal cysts in bilateral kidneys with largest arising from the right kidney lower pole, medially measuring up to 1.4 x 1.5 cm. There are multiple sub 5 mm hypoattenuating structures in bilateral kidneys, which are too small to adequately characterized, but statistically favored to represent cysts as well. Evaluation of nonobstructing renal calculi is limited due to excreted contrast in the collecting systems;  however, no obstructing nephroureterolithiasis seen on either side. Bladder wall trabeculations noted, suggesting sequela of chronic urinary outflow obstruction. No focal bladder mass or bladder calculi. No perivesical fat stranding. Stomach/Bowel: There is tiny sliding hiatal hernia. No disproportionate dilation of the small or  large bowel loops. No evidence of abnormal bowel wall thickening or inflammatory changes. The appendix was not visualized; however there is no acute inflammatory process in the right lower quadrant. Mild-to-moderate stool burden noted mainly in the right hemicolon. Vascular/Lymphatic: No ascites or pneumoperitoneum. No abdominal or pelvic lymphadenopathy, by size criteria. Mild ectasia of infrarenal aorta noted measuring up to 2.7 cm in diameter. There is small peripheral intraluminal thrombus/hematoma. No other aneurysmal dilation of the major abdominal arteries. There are mild peripheral atherosclerotic vascular calcifications of the aorta and its major branches. Reproductive: Enlarged prostate. Symmetric seminal vesicles. Other: There is a tiny fat containing umbilical hernia. The soft tissues and abdominal wall are otherwise unremarkable. Musculoskeletal: No suspicious osseous lesions. There are mild multilevel degenerative changes in the visualized spine. Right hip arthroplasty noted. There is bone resorption along the right medial greater trochanter region. No discrete collection seen around the right hip prosthesis. Correlate clinically to determine the need for additional imaging. Review of the MIP images confirms the above findings. IMPRESSION: 1. No embolism to the proximal subsegmental pulmonary artery level. No lung mass, consolidation, pleural effusion or pneumothorax. No suspicious lung nodule. 2. No acute inflammatory process identified within the abdomen or pelvis. 3. Multiple other nonacute observations (such as bronchial wall thickening, bilateral renal cysts, bladder wall trabeculations, ectasia of infrarenal aorta measuring up to 2.7 cm, bone resorption along the right medial greater trochanter, enlarged prostate gland, etc.), as described above. Aortic Atherosclerosis (ICD10-I70.0). Electronically Signed   By: Ree Molt M.D.   On: 03/21/2024 08:43   DG Chest Port 1 View Result Date:  03/21/2024 EXAM: 1 VIEW(S) XRAY OF THE CHEST 03/21/2024 03:48:00 AM COMPARISON: None available. CLINICAL HISTORY: Chest pain. FINDINGS: LUNGS AND PLEURA: No focal pulmonary opacity. No pleural effusion. No pneumothorax. HEART AND MEDIASTINUM: Atherosclerotic plaque. No acute abnormality of the cardiac and mediastinal silhouettes. BONES AND SOFT TISSUES: No acute osseous abnormality. IMPRESSION: 1. No acute findings. Electronically signed by: Greig Pique MD 03/21/2024 03:53 AM EST RP Workstation: [id number]   DG Hip Unilat W or Wo Pelvis 2-3 Views Right Result Date: 03/21/2024 EXAM: 2 or 3 VIEW(S) XRAY OF THE PELVIS AND RIGHT HIP 03/21/2024 03:47:00 AM COMPARISON: Prior examination dated 03/09/2024. CLINICAL HISTORY: CP Chest pain. FINDINGS: BONES AND JOINTS: SI joints are symmetric. Right hip hemiarthroplasty is again seen unchanged in position. Lucency surrounding the proximal stem component near the greater trochanter measures up to 8 mm and appears unchanged from the prior examination. No acute fracture or dislocation identified. Bones are diffusely osteopenic. The left hip demonstrates normal alignment. SOFT TISSUES: Unremarkable. IMPRESSION: 1. Right hip hemiarthroplasty in expected position with stable lucency surrounding the proximal stem component near the greater trochanter measuring up to 8 mm. Findings may be secondary to loosening or infection. 2. No acute fracture or dislocation identified. 3. Diffuse osteopenia. Electronically signed by: Greig Pique MD 03/21/2024 03:53 AM EST RP Workstation: [id number]    Scheduled Meds:  amLODipine   5 mg Oral Daily   aspirin  EC  81 mg Oral Daily   atorvastatin   40 mg Oral QHS   Chlorhexidine  Gluconate Cloth  6 each Topical Daily   docusate sodium   100 mg Oral BID   enoxaparin  (LOVENOX ) injection  40 mg Subcutaneous Q24H   feeding supplement  237 mL Oral BID BM   hydrochlorothiazide   12.5 mg Oral Daily   losartan   50 mg Oral Daily   mirtazapine   15 mg  Oral QHS   tamsulosin   0.4 mg Oral Daily   Continuous Infusions:   LOS: 0 days   Time spent: 45 minutes  Lonni KANDICE Moose, MD  [facility]  03/22/2024, 6:03 PM   "

## 2024-03-23 DIAGNOSIS — F430 Acute stress reaction: Secondary | ICD-10-CM

## 2024-03-23 MED ORDER — BUPRENORPHINE 10 MCG/HR TD PTWK: 1.0000 | MEDICATED_PATCH | TRANSDERMAL | Status: AC

## 2024-03-23 MED ORDER — DOCUSATE SODIUM 100 MG PO CAPS
200.0000 mg | ORAL_CAPSULE | Freq: Every day | ORAL | Status: AC
  Administered 2024-03-23 – 2024-03-24 (×2): 200 mg via ORAL
  Filled 2024-03-23: qty 2

## 2024-03-23 MED ORDER — FAMOTIDINE 20 MG PO TABS
20.0000 mg | ORAL_TABLET | Freq: Two times a day (BID) | ORAL | Status: AC
  Administered 2024-03-23 – 2024-03-24 (×4): 20 mg via ORAL
  Filled 2024-03-23 (×4): qty 1

## 2024-03-23 MED ORDER — PANTOPRAZOLE SODIUM 40 MG PO TBEC
40.0000 mg | DELAYED_RELEASE_TABLET | Freq: Every day | ORAL | Status: AC
  Filled 2024-03-23: qty 1

## 2024-03-23 MED ORDER — OXYCODONE HCL 5 MG PO TABS
10.0000 mg | ORAL_TABLET | ORAL | Status: DC | PRN
  Administered 2024-03-23 – 2024-03-24 (×3): 10 mg via ORAL
  Filled 2024-03-23 (×3): qty 2

## 2024-03-23 NOTE — Consult Note (Cosign Needed Addendum)
 [facility] Psychiatric Consult Initial  Patient Name: .TONIO SEIDER  MRN: [id number]  DOB: 04-21-1955  Consult Order details:  Orders (From admission, onward)     Start     Ordered   03/23/24 0813  IP CONSULT TO PSYCHIATRY       Ordering Provider: Maranda Lonni MATSU, MD  Provider:  (Not yet assigned)  Question Answer Comment  Location [facility]   Reason for Consult? Suicidal Ideations      03/23/24 0817             Mode of Visit: In person    Psychiatry Consult Evaluation  Service Date: March 23, 2024 LOS:  LOS: 1 day  Chief Complaint I just feel like the owrld would be better off if I werent in it. I know that is not the right way. [facility] and they sent a sheriff to my house. I reached out for help and all they could do is send a sheriff to my house. Historu of od in 10/10/2017 OD on MSIR, one bottle full. [place], and my brother [facility]) called for help. I got Narcan to bring me back to life.    Primary Psychiatric Diagnoses  Acute stress reaction 2.  MDD 3.  Adjustment disorder  Assessment  KENDRIX ORMAN is a 69 y.o. male admitted: Medicallyfor 03/21/2024  2:57 AM for chest pain. He carries the psychiatric diagnoses of MDD and has a past medical history of  chronic pain, hypertension, hyperlipidemia, and DDD.   His current presentation of passive suicidal ideation in the context of chronic pain and functional decline is most consistent with depression exacerbated by poorly controlled pain, rather than an acute primary suicidal crisis. He meets criteria for Major Depressive Disorder, based on history of depressed mood, functional impairment, and prior suicide attempt. Current outpatient psychotropic medications include mirtazapine , which he reports has been helpful historically. He appears to have been medication compliant prior to admission, as evidenced by consistent [facility] follow[facility]up.    The patient is a 69 year old male admitted to [facility] for chest pain and tremors. Psychiatry was consulted after the patient made a passive death statement prompting concern for suicidal ideation.  During todays evaluation, the patient repeatedly emphasized that chronic pain and loss of mobility are major contributors to his worsening depressive symptoms and intermittent suicidal thoughts. He provided a recent example, stating that he had requested pain medication approximately 30 minutes prior and had not yet received relief, which increased his frustration. He stated, Jesus been taking Tylenol  and aspirin  around the clock and nothing is helping, and it doesnt seem like the team here is doing much either.  He denied current suicidal or homicidal ideation at the time of assessment and denied intent or plan. He denied hallucinations, delusions, or paranoia. He became mildly frustrated during questioning regarding suicidality but remained cooperative overall. His primary stated goal at this time is improved pain management, which he believes would significantly improve his mood.  Although the patient has significant chronic suicide risk factors, including prior suicide attempt, chronic pain, disability, social isolation, and firearm access, he denies current suicidal intent or plan and does not demonstrate evidence of imminent risk at this time. His suicidal thoughts appear situational and pain-driven, and he remains future-oriented and engaged in care.  Diagnoses:  Active Hospital problems: Principal Problem:   Chest pain    Plan   ##  Psychiatric Medication Recommendations:  -Continue current medications  -Consider referral to suboxone clinic for pain management.   ## Medical Decision Making Capacity: Not specifically addressed in this encounter  ## Further Work-up:  -- Defer to primary team EKG, While pt on Qtc prolonging medications, please monitor & replete K+ to 4 and  Mg2+ to 2, TOC consult for substance abuse resources, U/A, or UDS -- most recent EKG on 02/03 had QtC of 428 -- Pertinent labwork reviewed earlier this admission includes: troponin 35, D-Dimer 1.39   ## Disposition:-- There are no psychiatric contraindications to discharge at this time  ## Behavioral / Environmental: - No specific recommendations at this time.     ## Safety and Observation Level:  - Based on my clinical evaluation, I estimate the patient to be at low risk of self harm in the current setting. - At this time, we recommend  routine. This decision is based on my review of the chart including patient's history and current presentation, interview of the patient, mental status examination, and consideration of suicide risk including evaluating suicidal ideation, plan, intent, suicidal or self-harm behaviors, risk factors, and protective factors. This judgment is based on our ability to directly address suicide risk, implement suicide prevention strategies, and develop a safety plan while the patient is in the clinical setting. Please contact our team if there is a concern that risk level has changed.  CSSR Risk Category:C-SSRS RISK CATEGORY: High Risk  Suicide Risk Assessment: Patient has following modifiable risk factors for suicide: under treated depression , social isolation, current symptoms: anxiety/panic, insomnia, impulsivity, anhedonia, hopelessness, and pain, medical illness (ie new dx of cancer), which we are addressing by outpatient management. Patient has following non-modifiable or demographic risk factors for suicide: male gender Patient has the following protective factors against suicide: Access to outpatient mental health care, Supportive family, Supportive friends, Cultural, spiritual, or religious beliefs that discourage suicide, Frustration tolerance, no history of suicide attempts, and no history of NSSIB  Thank you for this consult request. Recommendations have  been communicated to the primary team.  We will sign off at this time.   Majel GORMAN Ramp, FNP       History of Present Illness   Patient Report:  On interview, the patient reports that he currently feels pretty good. He states that he had been feeling depressed recently in the context of stress related to the stock market plunging, but notes that prior to this stressor he had been doing fairly well. He acknowledged making the statement that the world would be better off without me, but clarified, I know thats not the right way to think.  The patient endorsed a prior suicide attempt in 2019 via morphine sulfate overdose, stating, I took a full bottle and passed out on the floor at my beach [place]. My brother called for help and they gave me Narcan to bring me back. He reports that this occurred during a period of significant distress.  Psych ROS:  Depression: Frustration Anxiety:  excessive worrying Mania (lifetime and current): Denies Psychosis: (lifetime and current): Denies  Collateral information:  Not available  Review of Systems  Psychiatric/Behavioral:  Positive for depression.   All other systems reviewed and are negative.    Psychiatric and Social History  Psychiatric History:  Information collected from chart review and patient reports  Prev Dx/Sx: frustration, sadness Current Psych Provider: [facility] managed Home Meds (current): Mirtazepine 15 Previous Med Trials: Fluoxetine Therapy: None at this time  Prior Psych  Hospitalization: Denies  Prior Self Harm: Denies Prior Violence: Denies  Family Psych History: Denies  Family Hx suicide: Denies  Social History: 1 daughter (33yo)  and 2 grand daughters. 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 Divorce after 32 years of marriage.   Developmental Hx: WNL Educational Hx: Associates  Occupational Hx: Disabled veteran Legal Hx: Denies Living Situation:  Lives alone in his childhood home.  Spiritual Hx: [employer] Access to weapons/lethal means: Denies   Substance History Alcohol: Not at this time  Type of alcohol Beer Last Drink >10years ago Number of drinks per day No History of alcohol withdrawal seizures No History of DT's No Tobacco: Quit> 20 years ago Illicit drugs: Hx of marijuana use- quit about 3 years ago. 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  Prescription drug abuse: No Rehab hx: No   Exam Findings  Physical Exam: age appropriate male, appears to be in pain.  Vital Signs:  Temp:  [98.4 F (36.9 C)-98.7 F (37.1 C)] 98.7 F (37.1 C) (02/04 2114) Pulse Rate:  [92] 92 (02/04 2114) Resp:  [18-19] 18 (02/04 2114) BP: (120-152)/(61-75) 120/61 (02/04 2114) SpO2:  [93 %-94 %] 94 % (02/04 2114) Blood pressure 120/61, pulse 92, temperature 98.7 F (37.1 C), temperature source Oral, resp. rate 18, height 6' 1 (1.854 m), weight 79.4 kg, SpO2 94%. Body mass index is 23.09 kg/m.  Physical Exam Vitals and nursing note reviewed.  Constitutional:      Appearance: He is well-developed and normal weight.  Skin:    General: Skin is warm and dry.  Neurological:     General: No focal deficit present.     Mental Status: He is alert and oriented to person, place, and time.  Psychiatric:        Mood and Affect: Mood normal.        Behavior: Behavior normal.     Mental Status Exam: General Appearance: Fairly Groomed  Orientation:  Full (Time, Place, and Person)  Memory:  Immediate;   Good Recent;   Fair Remote;   Good  Concentration:  Concentration: Good and Attention Span: Good  Recall:  Fair  Attention  Fair  Eye Contact:  Good  Speech:  Clear and Coherent and Normal Rate  Language:  Good  Volume:  Normal  Mood: Im pretty good  Affect:  Appropriate and Congruent  Thought Process:  Coherent, Linear, and Descriptions of Associations: Intact  Thought Content:  Logical  Suicidal Thoughts:  No  Homicidal Thoughts:  No   Judgement:  Fair  Insight:  Good  Psychomotor Activity:  Normal  Akathisia:  No  Fund of Knowledge:  Good      Assets:  Communication Skills Desire for Improvement Financial Resources/Insurance Leisure Time Physical Health Social Support  Cognition:  WNL  ADL's:  Intact  AIMS (if indicated):        Other History   These have been pulled in through the EMR, reviewed, and updated if appropriate.  Family History:  The patient's family history is not on file.  Medical History: Past Medical History:  Diagnosis Date   Cluster headaches    entered in Seroquel study at [facility] 9-09   Colon polyps    5 tubular adenomas on 11-06-11   Degenerative disc disease, thoracic    Low back pain    from Spinal stenosis   Neck pain    from cervical spondylosis   Other social stressor  with depression   Pure hypercholesterolemia    Seizures (HCC)     Surgical History: Past Surgical History:  Procedure Laterality Date   CERVICAL DISCECTOMY     COLONOSCOPY     KNEE ARTHROSCOPY       Medications:  Current Medications[1]  Allergies: Allergies[2]  Majel GORMAN Ramp, FNP     [1]  Current Facility-Administered Medications:    acetaminophen  (TYLENOL ) tablet 650 mg, 650 mg, Oral, Q6H PRN, 650 mg at 03/22/24 2131 **OR** acetaminophen  (TYLENOL ) suppository 650 mg, 650 mg, Rectal, Q6H PRN, Zella, Mir M, MD   albuterol  (PROVENTIL ) (2.5 MG/3ML) 0.083% nebulizer solution 2.5 mg, 2.5 mg, Nebulization, Q2H PRN, Zella, Mir M, MD   amLODipine  (NORVASC ) tablet 5 mg, 5 mg, Oral, Daily, Zella, Mir M, MD, 5 mg at 03/23/24 0820   aspirin  EC tablet 81 mg, 81 mg, Oral, Daily, Zella, Mir M, MD, 81 mg at 03/23/24 0820   atorvastatin  (LIPITOR) tablet 40 mg, 40 mg, Oral, QHS, Ikramullah, Mir M, MD, 40 mg at 03/22/24 2131   [START ON 03/25/2024] buprenorphine  (BUTRANS ) 10 MCG/HR 1 patch, 1 patch, Transdermal, Weekly, Maranda Lonni MATSU, MD   Chlorhexidine  Gluconate Cloth 2 %  PADS 6 each, 6 each, Topical, Daily, Sattenfield, Ole ORN, NP   cyclobenzaprine  (FLEXERIL ) tablet 10 mg, 10 mg, Oral, BID PRN, Zella, Mir M, MD   docusate sodium  (COLACE) capsule 200 mg, 200 mg, Oral, Daily, Maranda Lonni MATSU, MD, 200 mg at 03/23/24 9179   enoxaparin  (LOVENOX ) injection 40 mg, 40 mg, Subcutaneous, Q24H, Zella, Mir M, MD, 40 mg at 03/23/24 1114   famotidine  (PEPCID ) tablet 20 mg, 20 mg, Oral, BID, Maranda Lonni MATSU, MD, 20 mg at 03/23/24 1113   feeding supplement (ENSURE PLUS HIGH PROTEIN) liquid 237 mL, 237 mL, Oral, BID BM, Zella, Mir M, MD, 237 mL at 03/23/24 0825   hydrochlorothiazide  (HYDRODIURIL ) tablet 12.5 mg, 12.5 mg, Oral, Daily, Zella, Mir M, MD, 12.5 mg at 03/23/24 9180   HYDROmorphone  (DILAUDID ) injection 1 mg, 1 mg, Intravenous, Q2H PRN, Zella, Mir M, MD, 1 mg at 03/22/24 2131   HYDROmorphone  (DILAUDID ) tablet 2 mg, 2 mg, Oral, Q3H PRN, Zella, Mir M, MD, 2 mg at 03/23/24 9180   losartan  (COZAAR ) tablet 50 mg, 50 mg, Oral, Daily, Zella, Mir M, MD, 50 mg at 03/23/24 9179   mirtazapine  (REMERON ) tablet 15 mg, 15 mg, Oral, QHS, Zella, Mir M, MD, 15 mg at 03/22/24 2131   ondansetron  (ZOFRAN ) tablet 4 mg, 4 mg, Oral, Q6H PRN **OR** ondansetron  (ZOFRAN ) injection 4 mg, 4 mg, Intravenous, Q6H PRN, Zella, Mir M, MD   Oral care mouth rinse, 15 mL, Mouth Rinse, PRN, Zella, Mir M, MD   pantoprazole  (PROTONIX ) EC tablet 40 mg, 40 mg, Oral, Daily, Maranda Lonni MATSU, MD   polyethylene glycol (MIRALAX  / GLYCOLAX ) packet 17 g, 17 g, Oral, Daily PRN, Zella, Mir M, MD, 17 g at 03/23/24 0818   tamsulosin  (FLOMAX ) capsule 0.4 mg, 0.4 mg, Oral, Daily, Zella, Mir M, MD, 0.4 mg at 03/23/24 9180   zolpidem  (AMBIEN ) tablet 5 mg, 5 mg, Oral, QHS PRN, Zella, Mir M, MD, 5 mg at 03/23/24 0056 [2]  Allergies Allergen Reactions   Tramadol Other (See Comments)    Unknown    Trazodone Other (See Comments)    Headache    Codeine Other (See Comments)    Headaches   Hydrocodone  Bitartrate Er Other (See Comments)    Headaches   Lyrica [Pregabalin] Other (See Comments)  Leg cramps    Methadone Hcl Other (See Comments)    Seizures    Nucynta [Tapentadol] Other (See Comments)    Unknown    Oxycodone  Other (See Comments)    absent seizures   Sansert [Methysergide] Other (See Comments)    Leg cramps    Xanax [Alprazolam] Other (See Comments)    Sedation

## 2024-03-23 NOTE — Consult Note (Incomplete)
 This is a 69 year old male who was admitted to [facility] for complaints of chest pain and tremors. He is being evaluated due to the statement I feel like the whole world would better off it I was not here. He currently feels pretty good. He stated he was feeling depressed because of the stock market plunging, prior to that he was feeling pretty good. He is currently sitting up in the bed and is cooperative. He is alert and oriented. He currently denies suicidal and homicidal ideation. He endorsed a suicide attempt in 2019 (morphine sulfate overdose). He does have access to weapons (guns) in his home. He denies hallucinations and delusions. He has a history of MDD and PTSD for 20 years. He has no family history of mental illness to his knowledge. He seeks mental health services through the [facility] (Dr. Alvie). He is divorced and currently lives alone [employer]) and has one daughter and 2 grandchildren who lives in [place]. He has a brother (deceased) and 1 sister. He denies difficulty sleeping and has a good appetite. His medication consists of Remeron , Hydrochlorothiazide , Losartan , Flomax , and Amlodipine . He has a past history of substance abuse (cocaine, marijuana, and alcohol), but currently denies substance use and tobacco. He spends a lot of time on social media. He is confined to a wheelchair due to chronic pain. 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

## 2024-03-23 NOTE — Progress Notes (Signed)
 " PROGRESS NOTE    Martin Singleton  FMW:[id number] DOB: 06-Jan-1956 DOA: 03/21/2024 PCP: [facility]   Chief Complaint: Acute on chronic pain, chest pain   HPI: Martin Singleton is a 69 y.o. male with medical history significant for hypertension, hyperlipidemia, chronic pain from spinal stenosis and degenerative disc disease being admitted to the hospital with acute on chronic pain as well as chest pain.  He tells me that he has a long history of chronic pain, starting in 1975 when he broke his left hip while on the way to being deployed to [place].  About 5 years ago, he had a severe complex fracture of the right hip after falling off of a ladder and has had more severe chronic pain since that time.  He has seen multiple pain management doctors, currently followed by Dr. Fairy Badder at [facility] management center.  States that his chronic pain is in his right hip and leg, low back, and neck.  For the last 3 days, this pain has been much more severe.  He denies any recent fevers, chills, nausea, vomiting, shortness of breath, cough, injury or falls.  At baseline, he gets around his house in a wheelchair and walker.  He lives by himself.  For the last 3 days, his pain has been so severe he has not been able to eat, he has hardly been able to maneuver himself in his home, has not been able to transfer himself from bed to wheelchair, etc. as he normally does.  He called EMS for this severe pain.  Also starting a couple of days ago, he started having some substernal chest pain, it seems to come and go and not be changed by position or movement.  He has had chest pain in the past, states that he had a heart catheterization at [facility] last year, was told that he had some plaque disease, but no lesions that required intervention.  Currently his chronic pain is managed by [place]  patch, and he is wearing 1 now.   Subjective: Patient states still having significant pain.  Thinks the Dilaudid  is not  working well.  Says the Toradol  did nothing for him.  Wants to try oxycodone .  Tells me he is no longer suicidal.    Hospital Course: No notes on file   Assessment and Plan:  1.  Chronic pain syndrome with sciatica.  - Followed by pain clinic.  Has had multiple injections in his back.  He tells me he does not have a pain contract which is hard to believe.  Tells me other than aspirin  and the buprenorphine  patch he takes no other medications for pain.  2.  Chest pain.  Ruled out.  No further workup.  Continue his usual cardiac medications.  3.  Suicidal ideation.  He denies today.  Appreciate behavioral health consult.    DVT prophylaxis: enoxaparin  (LOVENOX ) injection 40 mg Start: 03/21/24 1200     Code Status: Full Code Family Communication: Lives alone. Disposition Plan: Looks like he will need a SNF Reason for continuing need for hospitalization: Pain control and mobility.  Objective: Vitals:   03/22/24 0323 03/22/24 1255 03/22/24 2114 03/23/24 1412  BP: 139/67 (!) 152/75 120/61 125/68  Pulse: 80 92 92 87  Resp: 18 19 18 20   Temp: 98.2 F (36.8 C) 98.4 F (36.9 C) 98.7 F (37.1 C) 98.9 F (37.2 C)  TempSrc: Oral Oral Oral Oral  SpO2: 95% 93% 94% 94%  Weight:  Height:        Intake/Output Summary (Last 24 hours) at 03/23/2024 1733 Last data filed at 03/23/2024 1500 Gross per 24 hour  Intake 600 ml  Output 2750 ml  Net -2150 ml   Filed Weights   03/21/24 0309  Weight: 79.4 kg    Examination: Chronically ill but nontoxic HEENT: PERRLA Cabbell EOMI oropharynx clear Neck is supple without meningismus Cardiovascular: Regular rate and rhythm S1-S2 no murmurs rubs gallops  lungs are clear throughout Abdomen is soft, bowel sounds are present Extremities are warm well-perfused no edema +2 pulses in the feet.  Able to move the feet.  Data Reviewed: I have personally reviewed following labs and imaging studies  CBC: Recent Labs  Lab 03/21/24 0355  03/22/24 0547  WBC 6.9 4.4  NEUTROABS 4.9  --   HGB 11.8* 11.4*  HCT 37.3* 35.7*  MCV 87.4 87.3  PLT 347 342   Basic Metabolic Panel: Recent Labs  Lab 03/21/24 0355 03/22/24 0547  NA 140 139  K 3.8 3.7  CL 106 103  CO2 25 26  GLUCOSE 108* 82  BUN 23 16  CREATININE 0.76 0.73  CALCIUM  9.0 9.2   GFR: Estimated Creatinine Clearance: 97.9 mL/min (by C-G formula based on SCr of 0.73 mg/dL). Liver Function Tests: Recent Labs  Lab 03/21/24 0355  AST 25  ALT 8  ALKPHOS 43  BILITOT <0.2  PROT 7.2  ALBUMIN 3.9   No results for input(s): LIPASE, AMYLASE in the last 168 hours. No results for input(s): AMMONIA in the last 168 hours. Coagulation Profile: No results for input(s): INR, PROTIME in the last 168 hours. Cardiac Enzymes: No results for input(s): CKTOTAL, CKMB, CKMBINDEX, TROPONINI in the last 168 hours. ProBNP, BNP (last 5 results) No results for input(s): PROBNP, BNP in the last 8760 hours. HbA1C: No results for input(s): HGBA1C in the last 72 hours. CBG: No results for input(s): GLUCAP in the last 168 hours. Lipid Profile: No results for input(s): CHOL, HDL, LDLCALC, TRIG, CHOLHDL, LDLDIRECT in the last 72 hours. Thyroid Function Tests: No results for input(s): TSH, T4TOTAL, FREET4, T3FREE, THYROIDAB in the last 72 hours. Anemia Panel: No results for input(s): VITAMINB12, FOLATE, FERRITIN, TIBC, IRON, RETICCTPCT in the last 72 hours. Sepsis Labs: No results for input(s): PROCALCITON, LATICACIDVEN in the last 168 hours.  No results found for this or any previous visit (from the past 240 hours).   Radiology Studies: No results found.   Scheduled Meds:  amLODipine   5 mg Oral Daily   aspirin  EC  81 mg Oral Daily   atorvastatin   40 mg Oral QHS   [START ON 03/25/2024] buprenorphine   1 patch Transdermal Weekly   Chlorhexidine  Gluconate Cloth  6 each Topical Daily   docusate sodium   200 mg Oral  Daily   enoxaparin  (LOVENOX ) injection  40 mg Subcutaneous Q24H   famotidine   20 mg Oral BID   feeding supplement  237 mL Oral BID BM   hydrochlorothiazide   12.5 mg Oral Daily   losartan   50 mg Oral Daily   mirtazapine   15 mg Oral QHS   pantoprazole   40 mg Oral Daily   tamsulosin   0.4 mg Oral Daily   Continuous Infusions:   LOS: 1 day   Time spent: 35 minutes  Lonni KANDICE Moose, MD  [facility]  03/23/2024, 5:33 PM   "

## 2024-03-23 NOTE — TOC Progression Note (Signed)
 Transition of Care [facility]) - Progression Note   Patient Details  Name: Martin Singleton MRN: [id number] Date of Birth: 1956/01/21  [facility] CM[facility]SW Contact  Duwaine GORMAN Aran, LCSW Phone Number: 03/23/2024, 2:07 PM  Clinical Narrative: PT evaluation recommended SNF. However, psych was consulted. Care management following for disposition.  Expected Discharge Plan:  (TBD) Barriers to Discharge: Continued Medical Work up  Expected Discharge Plan and Services In-house Referral: Clinical Social Work Living arrangements for the past 2 months: Single Family Home Expected Discharge Date: 03/22/24                Social Drivers of Health (SDOH) Interventions SDOH Screenings   Food Insecurity: No Food Insecurity (03/21/2024)  Housing: Low Risk (03/21/2024)  Transportation Needs: Unmet Transportation Needs (03/21/2024)  Utilities: Not At Risk (03/21/2024)  Social Connections: Moderately Integrated (03/21/2024)  Tobacco Use: High Risk (03/21/2024)   Readmission Risk Interventions     No data to display

## 2024-03-23 NOTE — Evaluation (Signed)
 Occupational Therapy Evaluation Patient Details Name: Martin Singleton MRN: [id number] DOB: 1956/02/14 Today's Date: 03/23/2024   History of Present Illness   69 y.o. male with medical history significant for hypertension, hyperlipidemia, chronic pain from spinal stenosis and degenerative disc disease being admitted to the hospital with acute on chronic pain as well as chest pain.  cardiology consulted and determined not intervention needed for non-cardiac CP     Clinical Impressions PTA, patient lives at home alone with limited support and history of falls, pain and use of rollator and w/c for mobility and DME for showers. Has groceries delivered and prepares simple meals.  Currently, patient presents with deficits outlined below (see OT Problem List for details) most significantly pain, decreased ROM B shoulders, balance, activity tolerance deficits which limit mobility and BADL's. Patient will benefit from continued inpatient follow up therapy, <3 hours/day. Patient requires continued Acute care hospital level OT services to progress safety and functional performance and allow for discharge.       If plan is discharge home, recommend the following:   Two people to help with walking and/or transfers;A lot of help with bathing/dressing/bathroom;Assistance with cooking/housework;Assist for transportation;Help with stairs or ramp for entrance     Functional Status Assessment   Patient has had a recent decline in their functional status and demonstrates the ability to make significant improvements in function in a reasonable and predictable amount of time.     Equipment Recommendations   Other (comment) (at next venue to explore thru [place] w/c fit, lift recliner and elevated toilet)      Precautions/Restrictions   Precautions Precautions: Fall Restrictions Weight Bearing Restrictions Per Provider Order: No     Mobility Bed Mobility Overal bed mobility: Needs Assistance Bed  Mobility: Supine to Sit     Supine to sit: Mod assist     General bed mobility comments: incr time and effort, assist to progress RLE off bed and elevate trunk    Transfers Overall transfer level: Needs assistance   Transfers: Bed to chair/wheelchair/BSC             General transfer comment: pt declined attempt to stand d/t pain. lateral scooting transfer with CGA, cues to self assist and progress through movement bed>chair. pillow placed  under R hip d/t pain, pt reported some relief with off loading      Balance Overall balance assessment: Needs assistance Sitting-balance support: Feet supported, Bilateral upper extremity supported Sitting balance-Leahy Scale: Poor Sitting balance - Comments: poor tolerance for lateral leans for ADL's       Standing balance comment: unable/limited by pain                           ADL either performed or assessed with clinical judgement   ADL Overall ADL's : Needs assistance/impaired Eating/Feeding: Modified independent   Grooming: Wash/dry hands;Wash/dry face;Oral care;Sitting;Contact guard assist Grooming Details (indicate cue type and reason): pain limits tolerance upright Upper Body Bathing: Contact guard assist;Sitting   Lower Body Bathing: Maximal assistance;Sitting/lateral leans   Upper Body Dressing : Minimal assistance;Sitting   Lower Body Dressing: Maximal assistance;Total assistance;Sitting/lateral leans Lower Body Dressing Details (indicate cue type and reason): issued reacher and introduced strategies for functional reach to LB for garments Toilet Transfer:  (deferred this session) Toilet Transfer Details (indicate cue type and reason): will need DABSC Toileting- Clothing Manipulation and Hygiene: Maximal assistance Toileting - Clothing Manipulation Details (indicate cue type and reason): use of  condom catheter for voiding     Functional mobility during ADLs: Maximal assistance;+2 for physical  assistance;+2 for safety/equipment General ADL Comments: pain limits all mobility and functional reach with ADL's     Vision Baseline Vision/History: 0 No visual deficits              Pertinent Vitals/Pain Pain Assessment Pain Assessment: 0-10 Pain Score: 7  Pain Location: diffuse pain ongoing Pain Descriptors / Indicators: Sore Pain Intervention(s): Limited activity within patient's tolerance, Monitored during session, Premedicated before session, Repositioned, Ice applied, Heat applied     Extremity/Trunk Assessment Upper Extremity Assessment Upper Extremity Assessment: Right hand dominant;RUE deficits/detail;LUE deficits/detail (B shoulder pain with ROM limited to 0-120) RUE: Shoulder pain with ROM LUE: Shoulder pain with ROM   Lower Extremity Assessment Lower Extremity Assessment: Defer to PT evaluation   Cervical / Trunk Assessment Cervical / Trunk Assessment: Kyphotic   Communication Communication Communication: No apparent difficulties   Cognition Arousal: Alert Behavior During Therapy: WFL for tasks assessed/performed Cognition: No apparent impairments                               Following commands: Intact       Cueing  General Comments   Cueing Techniques: Verbal cues  pian limits all activity tolerance, tenderness and dry skin R > LE's, encouraged trial with ice for sciatica and heat for neck and shoulders and applied post tx   Exercises Exercises: Other exercises (foam cube strengthener 20 reps B hands TID)        Home Living Family/patient expects to be discharged to:: Private residence Living Arrangements: Alone   Type of Home: House Home Access: Ramped entrance     Home Layout: One level     Bathroom Shower/Tub: Chief Strategy Officer: Standard     Home Equipment: Wheelchair - Forensic Psychologist (2 wheels);Tub bench   Additional Comments: recommending a reconsult with [facility] for elevated toilet and lift  chair as well as home modification since w/c does not fit through doorways      Prior Functioning/Environment Prior Level of Function : History of Falls (last six months)             Mobility Comments: uses RW to get into bathroom, ambulates short distances, otherwise uses w/c ADLs Comments: states he falls on to and off of shower seat, reports ind withADLs, requires excessive time to complete    OT Problem List: Decreased range of motion;Decreased activity tolerance;Impaired balance (sitting and/or standing);Pain   OT Treatment/Interventions: Self-care/ADL training;Therapeutic exercise;Neuromuscular education;Energy conservation;DME and/or AE instruction;Therapeutic activities;Balance training;Patient/family education      OT Goals(Current goals can be found in the care plan section)   Acute Rehab OT Goals Patient Stated Goal: to feel less pain OT Goal Formulation: With patient Time For Goal Achievement: 04/06/24 Potential to Achieve Goals: Fair ADL Goals Pt Will Perform Lower Body Bathing: with mod assist;sitting/lateral leans;with adaptive equipment Pt Will Perform Lower Body Dressing: with mod assist;with adaptive equipment;sitting/lateral leans Pt Will Transfer to Toilet: bedside commode;with mod assist;squat pivot transfer Pt Will Perform Toileting - Clothing Manipulation and hygiene: with min assist;sitting/lateral leans Pt/caregiver will Perform Home Exercise Program: Both right and left upper extremity;Increased ROM;Increased strength;With written HEP provided;With Supervision   OT Frequency:  Min 2X/week       AM-PAC OT 6 Clicks Daily Activity     Outcome Measure Help from another person eating meals?: A  Little Help from another person taking care of personal grooming?: A Little Help from another person toileting, which includes using toliet, bedpan, or urinal?: A Lot Help from another person bathing (including washing, rinsing, drying)?: A Lot Help from  another person to put on and taking off regular upper body clothing?: A Little Help from another person to put on and taking off regular lower body clothing?: A Lot 6 Click Score: 15   End of Session Equipment Utilized During Treatment: Gait belt Nurse Communication: Mobility status  Activity Tolerance: Patient limited by pain Patient left: in bed;with call bell/phone within reach;with bed alarm set;with nursing/sitter in room  OT Visit Diagnosis: Unsteadiness on feet (R26.81);Repeated falls (R29.6);Muscle weakness (generalized) (M62.81);Other abnormalities of gait and mobility (R26.89);Pain Pain - part of body: Leg;Hip;Shoulder (back)                Time: 1310-1400 OT Time Calculation (min): 50 min Charges:  OT General Charges $OT Visit: 1 Visit OT Evaluation $OT Eval Low Complexity: 1 Low OT Treatments $Self Care/Home Management : 8-22 mins $Therapeutic Activity: 8-22 mins  Romney Compean OT/L Acute Rehabilitation Department  [phone]  03/23/2024, 4:17 PM

## 2024-03-24 MED ORDER — OXYCODONE HCL 5 MG PO TABS
10.0000 mg | ORAL_TABLET | ORAL | Status: AC | PRN
  Administered 2024-03-24: 10 mg via ORAL
  Filled 2024-03-24: qty 2

## 2024-03-24 MED ORDER — BISACODYL 10 MG RE SUPP
10.0000 mg | Freq: Once | RECTAL | Status: AC
  Administered 2024-03-24: 10 mg via RECTAL
  Filled 2024-03-24: qty 1

## 2024-03-24 MED ORDER — METHYLNALTREXONE BROMIDE 12 MG/0.6ML ~~LOC~~ SOLN
12.0000 mg | Freq: Once | SUBCUTANEOUS | Status: DC
  Filled 2024-03-24: qty 0.6

## 2024-03-24 MED ORDER — HYDROMORPHONE HCL 1 MG/ML IJ SOLN
0.5000 mg | Freq: Once | INTRAMUSCULAR | Status: AC
  Administered 2024-03-24: 0.5 mg via INTRAVENOUS
  Filled 2024-03-24: qty 0.5

## 2024-03-24 NOTE — [place] FL2 (Signed)
 " [place]  MEDICAID FL2 LEVEL OF CARE FORM     IDENTIFICATION  Patient Name: Martin Singleton Birthdate: [birth year] Sex: male Admission Date (Current Location): 03/21/2024  [facility] and [place] Number:  Producer, Television/film/video and Address:  [facility],  [address], [place] [address]      Provider Number: [id number]  Attending Physician Name and Address:  Maranda Lonni MATSU, MD  Relative Name and Phone Number:  [name] (daughter) [phone]    Current Level of Care: Hospital Recommended Level of Care: Skilled Nursing Facility Prior Approval Number:    Date Approved/Denied:   PASRR Number: [id number]  Discharge Plan: SNF    Current Diagnoses: Patient Active Problem List   Diagnosis Date Noted   Acute stress reaction 03/23/2024   Anxiety 02/12/2022   Cannabis abuse, in remission 02/12/2022   Cannabis dependence (HCC) 02/12/2022   Chronic back pain 02/12/2022   Cluster headache syndrome 02/12/2022   Hematuria, unspecified 02/12/2022   Irritable bowel syndrome 02/12/2022   Lower urinary tract symptoms 02/12/2022   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   Alcohol dependence (HCC) 02/12/2022   Neutropenia 02/12/2022   Encounter for administrative examinations, unspecified 02/12/2022   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   Post-traumatic stress disorder, unspecified 02/12/2022   Problems related to other legal circumstances 02/12/2022   Seizure (HCC) 02/12/2022   Shoulder pain 02/12/2022   Subjective tinnitus 02/12/2022   Chest pain 04/08/2020   Elevated troponin 04/08/2020   Anemia 04/07/2020   Dementia (HCC) 04/07/2020   Depression 04/07/2020   DNR (do not resuscitate) 04/07/2020   Hypokalemia 04/07/2020   Hypomagnesemia 04/07/2020   Osteomyelitis (HCC) 04/07/2020   Severe malnutrition 04/07/2020   Neutropenic fever 04/06/2020   Involuntary commitment 01/25/2018   Decubitus ulcer of buttock 01/19/2018   Pressure injury of right hip, unstageable (HCC)  01/18/2018   Complicated open wound of right hip 01/03/2018   Cellulitis of hip, right 12/22/2017   Right sided weakness 12/22/2017   Severe episode of recurrent major depressive disorder, 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   Acute respiratory failure (HCC) 10/11/2017   AKI (acute kidney injury) 10/11/2017   Aspiration pneumonia (HCC) 10/11/2017   Disease characterized by destruction of skeletal muscle 10/11/2017   Polysubstance overdose 10/11/2017   Homicidal ideations    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   GERD (gastroesophageal reflux disease) 01/15/2015   Diarrhea 01/23/2011   Nausea 01/23/2011   Suicidal ideation 08/04/2009    Orientation RESPIRATION BLADDER Height & Weight     Self, Time, Situation, Place  Normal External catheter Weight: 79.4 kg Height:  6' 1 (185.4 cm)  BEHAVIORAL SYMPTOMS/MOOD NEUROLOGICAL BOWEL NUTRITION STATUS      Continent Diet (regular)  AMBULATORY STATUS COMMUNICATION OF NEEDS Skin   Extensive Assist Verbally Bruising (erythema bilateral feet, ankle, buttocks)                       Personal Care Assistance Level of Assistance  Bathing, Feeding, Dressing Bathing Assistance: Maximum assistance Feeding assistance: Independent Dressing Assistance: Maximum assistance     Functional Limitations Info  Sight, Hearing, Speech Sight Info: Impaired (reading glasses) Hearing Info: Adequate Speech Info: Adequate    SPECIAL CARE FACTORS FREQUENCY  PT (By licensed PT), OT (By licensed OT)     PT Frequency: 5x/week OT Frequency: 5x/week            Contractures Contractures Info: Not present    Additional Factors Info  Code Status, Allergies Code Status Info: Full Code Allergies Info: Tramadol, Trazodone, Codeine, Hydrocodone  Bitartrate Er, Lyrica (Pregabalin), Methadone Hcl, Nucynta (Tapentadol), Oxycodone , Sansert (Methysergide), Xanax (Alprazolam)           Current Medications  (03/24/2024):  This is the current hospital active medication list Current Facility-Administered Medications  Medication Dose Route Frequency Provider Last Rate Last Admin   acetaminophen  (TYLENOL ) tablet 650 mg  650 mg Oral Q6H PRN Zella, Mir M, MD   650 mg at 03/22/24 2131   Or   acetaminophen  (TYLENOL ) suppository 650 mg  650 mg Rectal Q6H PRN Zella, Mir M, MD       albuterol  (PROVENTIL ) (2.5 MG/3ML) 0.083% nebulizer solution 2.5 mg  2.5 mg Nebulization Q2H PRN Zella, Mir M, MD       amLODipine  (NORVASC ) tablet 5 mg  5 mg Oral Daily Zella, Mir M, MD   5 mg at 03/24/24 0847   aspirin  EC tablet 81 mg  81 mg Oral Daily Ikramullah, Mir M, MD   81 mg at 03/24/24 9152   atorvastatin  (LIPITOR) tablet 40 mg  40 mg Oral QHS Zella, Mir M, MD   40 mg at 03/23/24 2155   [START ON 03/25/2024] buprenorphine  (BUTRANS ) 10 MCG/HR 1 patch  1 patch Transdermal Weekly Maranda Lonni MATSU, MD       Chlorhexidine  Gluconate Cloth 2 % PADS 6 each  6 each Topical Daily Delia Ole ORN, NP       cyclobenzaprine  (FLEXERIL ) tablet 10 mg  10 mg Oral BID PRN Zella, Mir M, MD       docusate sodium  (COLACE) capsule 200 mg  200 mg Oral Daily Maranda Lonni MATSU, MD   200 mg at 03/24/24 0847   enoxaparin  (LOVENOX ) injection 40 mg  40 mg Subcutaneous Q24H Zella, Mir M, MD   40 mg at 03/24/24 1316   famotidine  (PEPCID ) tablet 20 mg  20 mg Oral BID Maranda Lonni MATSU, MD   20 mg at 03/24/24 0847   feeding supplement (ENSURE PLUS HIGH PROTEIN) liquid 237 mL  237 mL Oral BID BM Zella, Mir M, MD   237 mL at 03/23/24 0825   hydrochlorothiazide  (HYDRODIURIL ) tablet 12.5 mg  12.5 mg Oral Daily Ikramullah, Mir M, MD   12.5 mg at 03/24/24 9152   losartan  (COZAAR ) tablet 50 mg  50 mg Oral Daily Ikramullah, Mir M, MD   50 mg at 03/24/24 9153   mirtazapine  (REMERON ) tablet 15 mg  15 mg Oral QHS Zella, Mir M, MD   15 mg at 03/23/24 2155   ondansetron  (ZOFRAN ) tablet 4 mg  4 mg Oral Q6H  PRN Zella Katha HERO, MD       Or   ondansetron  (ZOFRAN ) injection 4 mg  4 mg Intravenous Q6H PRN Zella Katha HERO, MD       Oral care mouth rinse  15 mL Mouth Rinse PRN Zella, Mir M, MD       oxyCODONE  (Oxy IR/ROXICODONE ) immediate release tablet 10 mg  10 mg Oral Q4H PRN Maranda Lonni MATSU, MD       pantoprazole  (PROTONIX ) EC tablet 40 mg  40 mg Oral Daily Maranda Lonni MATSU, MD       tamsulosin  (FLOMAX ) capsule 0.4 mg  0.4 mg Oral Daily Zella, Mir M, MD   0.4 mg at 03/24/24 9152   zolpidem  (AMBIEN ) tablet 5 mg  5 mg Oral QHS PRN Zella Katha HERO, MD   5 mg at 03/23/24 2156  Discharge Medications: Please see discharge summary for a list of discharge medications.  Relevant Imaging Results:  Relevant Lab Results:   Additional Information SSN [id number]  Sonda Manuella Quill, RN     "

## 2024-03-24 NOTE — TOC Progression Note (Signed)
 Transition of Care [facility]) - Progression Note    Patient Details  Name: Martin Singleton MRN: [id number] Date of Birth: 1955/03/26  [facility]) CM/SW Contact  Sonda Manuella Quill, RN Phone Number: 03/24/2024, 3:16 PM  Clinical Narrative:    Recc SNF; spoke w/ pt in room; pt agreed to recc; explained SNF process; pt verbalized understanding; he does not have facility of choice; FL2 completed and sent for co-sign; faxed out for bed offers; LVM for April Alexander, Transfer Coordinator at [facility]; awaiting return call to verify [facility] benefit.   Expected Discharge Plan: Skilled Nursing Facility Barriers to Discharge: Continued Medical Work up               Expected Discharge Plan and Services In-house Referral: Clinical Social Work Discharge Planning Services: CM Consult   Living arrangements for the past 2 months: Single Family Home Expected Discharge Date: 03/22/24                                     Social Drivers of Health (SDOH) Interventions SDOH Screenings   Food Insecurity: No Food Insecurity (03/21/2024)  Housing: Low Risk (03/21/2024)  Transportation Needs: Unmet Transportation Needs (03/21/2024)  Utilities: Not At Risk (03/21/2024)  Social Connections: Moderately Integrated (03/21/2024)  Tobacco Use: High Risk (03/21/2024)    Readmission Risk Interventions     No data to display

## 2024-03-24 NOTE — TOC Initial Note (Signed)
 Transition of Care [facility]) - Initial/Assessment Note    Patient Details  Name: Martin Singleton MRN: [id number] Date of Birth: 1955/11/07  Transition of Care [facility]) CM/SW Contact:    Bascom Service, RN Phone Number: 03/24/2024, 11:58 AM  Clinical Narrative: [facility] to [facility] Transfer coordinator April-PA [place];CSW-Bertina Duncan 336 [phone], smokes,etoh,marijuana;Psyh-MDD-otpt resources.noted has w/c. PT recc [facility]. ICM to follow.                  Expected Discharge Plan: Skilled Nursing Facility Barriers to Discharge: Continued Medical Work up   Patient Goals and CMS Choice Patient states their goals for this hospitalization and ongoing recovery are:: Rehab          Expected Discharge Plan and Services In-house Referral: Clinical Social Work Discharge Planning Services: CM Consult   Living arrangements for the past 2 months: Single Family Home Expected Discharge Date: 03/22/24                                    Prior Living Arrangements/Services Living arrangements for the past 2 months: Single Family Home Lives with:: Self Patient language and need for interpreter reviewed:: Yes Do you feel safe going back to the place where you live?: Yes      Need for Family Participation in Patient Care: Yes (Comment) Care giver support system in place?: Yes (comment)   Criminal Activity/Legal Involvement Pertinent to Current Situation/Hospitalization: No - Comment as needed  Activities of Daily Living   ADL Screening (condition at time of admission) Independently performs ADLs?: Yes (appropriate for developmental age) Is the patient deaf or have difficulty hearing?: No Does the patient have difficulty seeing, even when wearing glasses/contacts?: No Does the patient have difficulty concentrating, remembering, or making decisions?: No  Permission Sought/Granted                  Emotional Assessment       Orientation: : Oriented to Self, Oriented to Place,  Oriented to  Time, Oriented to Situation Alcohol / Substance Use: Not Applicable Psych Involvement: No (comment)  Admission diagnosis:  Right hip pain [M25.551] Chest pain [R07.9] Chest pain, unspecified type [R07.9] Patient Active Problem List   Diagnosis Date Noted   Acute stress reaction 03/23/2024   Anxiety 02/12/2022   Cannabis abuse, in remission 02/12/2022   Cannabis dependence (HCC) 02/12/2022   Chronic back pain 02/12/2022   Cluster headache syndrome 02/12/2022   Hematuria, unspecified 02/12/2022   Irritable bowel syndrome 02/12/2022   Lower urinary tract symptoms 02/12/2022   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   Alcohol dependence (HCC) 02/12/2022   Neutropenia 02/12/2022   Encounter for administrative examinations, unspecified 02/12/2022   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   Post-traumatic stress disorder, unspecified 02/12/2022   Problems related to other legal circumstances 02/12/2022   Seizure (HCC) 02/12/2022   Shoulder pain 02/12/2022   Subjective tinnitus 02/12/2022   Chest pain 04/08/2020   Elevated troponin 04/08/2020   Anemia 04/07/2020   Dementia (HCC) 04/07/2020   Depression 04/07/2020   DNR (do not resuscitate) 04/07/2020   Hypokalemia 04/07/2020   Hypomagnesemia 04/07/2020   Osteomyelitis (HCC) 04/07/2020   Severe malnutrition 04/07/2020   Neutropenic fever 04/06/2020   Involuntary commitment 01/25/2018   Decubitus ulcer of buttock 01/19/2018   Pressure injury of right hip, unstageable (HCC) 01/18/2018   Complicated open wound of right hip 01/03/2018   Cellulitis of hip, right 12/22/2017  Right sided weakness 12/22/2017   Severe episode of recurrent major depressive disorder, 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   Acute respiratory failure (HCC) 10/11/2017   AKI (acute kidney injury) 10/11/2017   Aspiration pneumonia (HCC) 10/11/2017   Disease characterized by destruction of skeletal muscle 10/11/2017   Polysubstance overdose 10/11/2017   Homicidal  ideations    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 [place] [address] Phone: [phone] Fax: [phone]     Social Drivers of Health (SDOH) Social History: SDOH Screenings   Food Insecurity: No Food Insecurity (03/21/2024)  Housing: Low Risk (03/21/2024)  Transportation Needs: Unmet Transportation Needs (03/21/2024)  Utilities: Not At Risk (03/21/2024)  Social Connections: Moderately Integrated (03/21/2024)  Tobacco Use: High Risk (03/21/2024)   SDOH Interventions:     Readmission Risk Interventions     No data to display

## 2024-03-24 NOTE — Progress Notes (Signed)
 Occupational Therapy Treatment Patient Details Name: Martin Singleton MRN: [id number] DOB: 1955/06/30 Today's Date: 03/24/2024   History of present illness 69 y.o. male with medical history significant for hypertension, hyperlipidemia, chronic pain from spinal stenosis and degenerative disc disease being admitted to the hospital with acute on chronic pain as well as chest pain.  cardiology consulted and determined not intervention needed for non-cardiac CP   OT comments  Patient seen for 2nd visit post OOB with STEDY to recliner and tolerated for 2 hrs +. Pain ongling and positioning somewhat difficult but patient stated he felt encouraged. Use of STEDY for STS x multiple trials after hair washing and care seated and supported standing level in frame. Once back to bed educated on positioning and pain management. DABSC is set up in room for needs and nursing aware to use STEDY +2 to access. Patient will benefit from continued inpatient follow up therapy, <3 hours/day. Patient requires continued Acute care hospital level OT services to progress safety and functional performance and allow for discharge.        If plan is discharge home, recommend the following:  Two people to help with walking and/or transfers;A lot of help with bathing/dressing/bathroom;Assistance with cooking/housework;Assist for transportation;Help with stairs or ramp for entrance   Equipment Recommendations  Other (comment) (see prior comments)       Precautions / Restrictions Precautions Precautions: Fall Restrictions Weight Bearing Restrictions Per Provider Order: No       Mobility Bed Mobility Overal bed mobility: Needs Assistance Bed Mobility: Sit to Supine     Supine to sit: Mod assist, HOB elevated, Used rails Sit to supine: Mod assist, Max assist, HOB elevated, Used rails   General bed mobility comments: R LE management, cues for alignment and pain reducing mvmts    Transfers Overall transfer level: Needs  assistance Equipment used: Ambulation equipment used Transfers: Sit to/from Stand, Bed to chair/wheelchair/BSC Sit to Stand: Mod assist, +2 physical assistance, +2 safety/equipment, From elevated surface, Via lift equipment           General transfer comment: significant improvement wiht STS in STEDY from built up recliner after tolerance 2 hrs Transfer via Lift Equipment: Stedy   Balance Overall balance assessment: Needs assistance Sitting-balance support: Feet supported, Bilateral upper extremity supported Sitting balance-Leahy Scale: Fair Sitting balance - Comments: continues to have low sitting tolerance due to pain   Standing balance support: Reliant on assistive device for balance, Bilateral upper extremity supported Standing balance-Leahy Scale: Poor Standing balance comment: within STEDY frame                           ADL either performed or assessed with clinical judgement   ADL Overall ADL's : Needs assistance/impaired Eating/Feeding: Modified independent;Sitting Eating/Feeding Details (indicate cue type and reason): completed lunch meal up in recliner Grooming: Sitting;Set up;Brushing hair Grooming Details (indicate cue type and reason): up in recliner this session and OT ompleted assist with hair washing and pt performed hair combing Upper Body Bathing: Contact guard assist;Sitting   Lower Body Bathing: Maximal assistance;Sitting/lateral leans Lower Body Bathing Details (indicate cue type and reason): issued and trained in long handled sponge use Upper Body Dressing : Minimal assistance;Sitting   Lower Body Dressing: Maximal assistance;Sitting/lateral leans   Toilet Transfer:  (deferred this session but instructed in DABSC use and set up as needed) Toilet Transfer Details (indicate cue type and reason): set up [facility] for needs   Toileting -  Clothing Manipulation Details (indicate cue type and reason): use of condom catheter for voiding     Functional  mobility during ADLs: Moderate assistance;+2 for physical assistance;+2 for safety/equipment;Maximal assistance General ADL Comments: use of STEDY recliner back to bed but improved STS's x 4    Extremity/Trunk Assessment Upper Extremity Assessment Upper Extremity Assessment: Generalized weakness;Right hand dominant RUE: Shoulder pain with ROM LUE: Shoulder pain with ROM   Lower Extremity Assessment Lower Extremity Assessment: Defer to PT evaluation                 Communication Communication Communication: No apparent difficulties   Cognition Arousal: Alert Behavior During Therapy: WFL for tasks assessed/performed Cognition: No apparent impairments                               Following commands: Intact        Cueing   Cueing Techniques: Verbal cues  Exercises Exercises: Other exercises (foam cube strengthener 20 reps B hands TID)       General Comments tolerated 2 hrs in padded recliner, pain ongoing and heat applied    Pertinent Vitals/ Pain       Pain Assessment Pain Assessment: 0-10 Pain Score: 8  Pain Location: diffuse pain ongoing back, neck and R LE Pain Descriptors / Indicators: Sore, Sharp, Tender, Throbbing Pain Intervention(s): Limited activity within patient's tolerance, Monitored during session, Premedicated before session, Repositioned, Patient requesting pain meds-RN notified, RN gave pain meds during session, Heat applied, Relaxation   Frequency  Min 2X/week        Progress Toward Goals  OT Goals(current goals can now be found in the care plan section)  Progress towards OT goals: Progressing toward goals  Acute Rehab OT Goals Patient Stated Goal: to keep progressing OT Goal Formulation: With patient Time For Goal Achievement: 04/06/24 Potential to Achieve Goals: Fair ADL Goals Pt Will Perform Lower Body Bathing: with mod assist;sitting/lateral leans;with adaptive equipment Pt Will Perform Lower Body Dressing: with mod  assist;with adaptive equipment;sitting/lateral leans Pt Will Transfer to Toilet: bedside commode;with mod assist;squat pivot transfer Pt Will Perform Toileting - Clothing Manipulation and hygiene: with min assist;sitting/lateral leans Pt/caregiver will Perform Home Exercise Program: Both right and left upper extremity;Increased ROM;Increased strength;With written HEP provided;With Supervision  Plan         AM-PAC OT 6 Clicks Daily Activity     Outcome Measure   Help from another person eating meals?: A Little Help from another person taking care of personal grooming?: A Little Help from another person toileting, which includes using toliet, bedpan, or urinal?: A Lot Help from another person bathing (including washing, rinsing, drying)?: A Lot Help from another person to put on and taking off regular upper body clothing?: A Little Help from another person to put on and taking off regular lower body clothing?: A Lot 6 Click Score: 15    End of Session Equipment Utilized During Treatment: Gait belt  OT Visit Diagnosis: Unsteadiness on feet (R26.81);Repeated falls (R29.6);Muscle weakness (generalized) (M62.81);Other abnormalities of gait and mobility (R26.89);Pain Pain - Right/Left: Right Pain - part of body: Leg;Hip;Shoulder   Activity Tolerance Patient limited by pain   Patient Left with call bell/phone within reach;in bed;with bed alarm set   Nurse Communication Mobility status        Time: 8679-8654 OT Time Calculation (min): 25 min  Charges: OT General Charges $OT Visit: 1 Visit OT Treatments $Self Care/Home Management :  8-22 mins $Therapeutic Activity: 8-22 mins  Sammy Douthitt OT/L Acute Rehabilitation Department  [phone]  03/24/2024, 4:39 PM

## 2024-03-24 NOTE — Progress Notes (Signed)
 Occupational Therapy Treatment Patient Details Name: Martin Singleton MRN: [id number] DOB: 02-06-1956 Today's Date: 03/24/2024   History of present illness 69 y.o. male with medical history significant for hypertension, hyperlipidemia, chronic pain from spinal stenosis and degenerative disc disease being admitted to the hospital with acute on chronic pain as well as chest pain.  cardiology consulted and determined not intervention needed for non-cardiac CP   OT comments  Patient seen for skilled OT session this am. Agreed and tolerate progression OOB to recliner with STS multiple trials within STEDY frame. Pain continues to limit tolerance in sitting, standing and with all R LE mobility despite premedication, pacing, positioning and body alignment education.See below for current ADL status.  Patient somewhat hesitant to remain in recliner but reassured with OT to return to assist back to bed as needed. Patient will benefit from continued inpatient follow up therapy, <3 hours/day. Patient requires continued Acute care hospital level OT services to progress safety and functional performance and allow for discharge.        If plan is discharge home, recommend the following:  Two people to help with walking and/or transfers;A lot of help with bathing/dressing/bathroom;Assistance with cooking/housework;Assist for transportation;Help with stairs or ramp for entrance   Equipment Recommendations  Other (comment) (see prior comments)       Precautions / Restrictions Precautions Precautions: Fall Restrictions Weight Bearing Restrictions Per Provider Order: No       Mobility Bed Mobility Overal bed mobility: Needs Assistance Bed Mobility: Supine to Sit     Supine to sit: Mod assist, HOB elevated, Used rails     General bed mobility comments: R LE management, cues for alignment and pain reducing mvmts    Transfers Overall transfer level: Needs assistance Equipment used: Ambulation equipment  used Transfers: Sit to/from Stand, Bed to chair/wheelchair/BSC Sit to Stand: Mod assist, +2 physical assistance, +2 safety/equipment, From elevated surface, Via lift equipment           General transfer comment: significant improvement wiht STS in STEDY and transfer to built up recliner for comfort Transfer via Lift Equipment: Stedy   Balance Overall balance assessment: Needs assistance Sitting-balance support: Feet supported, Bilateral upper extremity supported Sitting balance-Leahy Scale: Fair Sitting balance - Comments: continues to have low sitting tolerance due to pain   Standing balance support: Reliant on assistive device for balance, Bilateral upper extremity supported Standing balance-Leahy Scale: Poor Standing balance comment: within STEDY frame                           ADL either performed or assessed with clinical judgement   ADL Overall ADL's : Needs assistance/impaired Eating/Feeding: Modified independent;Sitting   Grooming: Wash/dry hands;Wash/dry face;Oral care;Sitting;Set up Grooming Details (indicate cue type and reason): up in recliner this session Upper Body Bathing: Contact guard assist;Sitting   Lower Body Bathing: Maximal assistance;Sitting/lateral leans Lower Body Bathing Details (indicate cue type and reason): issued and trained in long handled sponge use Upper Body Dressing : Minimal assistance;Sitting   Lower Body Dressing: Maximal assistance;Sitting/lateral leans     Toilet Transfer Details (indicate cue type and reason): set up [facility] for needs   Toileting - Clothing Manipulation Details (indicate cue type and reason): use of condom catheter for voiding     Functional mobility during ADLs: Moderate assistance;+2 for physical assistance;+2 for safety/equipment;Maximal assistance General ADL Comments: use of STEDY to recliner but improved EOB and refcliner level tolerance to BADL's this visit  Extremity/Trunk Assessment Upper  Extremity Assessment Upper Extremity Assessment: Right hand dominant;Generalized weakness RUE: Shoulder pain with ROM LUE: Shoulder pain with ROM   Lower Extremity Assessment Lower Extremity Assessment: Defer to PT evaluation                 Communication Communication Communication: No apparent difficulties   Cognition Arousal: Alert Behavior During Therapy: WFL for tasks assessed/performed Cognition: No apparent impairments                               Following commands: Intact        Cueing   Cueing Techniques: Verbal cues        General Comments redness on lower legs appears at baseline, no SOB and VSS with transfer to recliner 132/76    Pertinent Vitals/ Pain       Pain Assessment Pain Assessment: 0-10 Pain Score: 7  Pain Location: diffuse pain ongoing back, neck and R LE Pain Descriptors / Indicators: Sore, Sharp, Tender, Throbbing Pain Intervention(s): Limited activity within patient's tolerance, Monitored during session, Premedicated before session, Repositioned, Relaxation   Frequency  Min 2X/week        Progress Toward Goals  OT Goals(current goals can now be found in the care plan section)  Progress towards OT goals: Progressing toward goals  Acute Rehab OT Goals Patient Stated Goal: to stay up through lunch OT Goal Formulation: With patient Time For Goal Achievement: 04/06/24 Potential to Achieve Goals: Fair ADL Goals Pt Will Perform Lower Body Bathing: with mod assist;sitting/lateral leans;with adaptive equipment Pt Will Perform Lower Body Dressing: with mod assist;with adaptive equipment;sitting/lateral leans Pt Will Transfer to Toilet: bedside commode;with mod assist;squat pivot transfer Pt Will Perform Toileting - Clothing Manipulation and hygiene: with min assist;sitting/lateral leans Pt/caregiver will Perform Home Exercise Program: Both right and left upper extremity;Increased ROM;Increased strength;With written HEP  provided;With Supervision  Plan         AM-PAC OT 6 Clicks Daily Activity     Outcome Measure   Help from another person eating meals?: A Little Help from another person taking care of personal grooming?: A Little Help from another person toileting, which includes using toliet, bedpan, or urinal?: A Lot Help from another person bathing (including washing, rinsing, drying)?: A Lot Help from another person to put on and taking off regular upper body clothing?: A Little Help from another person to put on and taking off regular lower body clothing?: A Lot 6 Click Score: 15    End of Session Equipment Utilized During Treatment: Gait belt  OT Visit Diagnosis: Unsteadiness on feet (R26.81);Repeated falls (R29.6);Muscle weakness (generalized) (M62.81);Other abnormalities of gait and mobility (R26.89);Pain Pain - Right/Left: Right Pain - part of body: Leg;Hip;Shoulder   Activity Tolerance Patient limited by pain   Patient Left in chair;with call bell/phone within reach;with chair alarm set   Nurse Communication Mobility status        Time: 9055-8989 OT Time Calculation (min): 26 min  Charges: OT General Charges $OT Visit: 1 Visit OT Treatments $Self Care/Home Management : 8-22 mins $Therapeutic Activity: 8-22 mins  Kenzlie Disch OT/L Acute Rehabilitation Department  [phone]  03/24/2024, 4:23 PM

## 2024-03-24 NOTE — Progress Notes (Signed)
 " PROGRESS NOTE    Martin Singleton  FMW:[id number] DOB: 01-Feb-1956 DOA: 03/21/2024 PCP: [facility]   Chief Complaint: Acute on chronic pain, chest pain   HPI: Martin Singleton is a 69 y.o. male with medical history significant for hypertension, hyperlipidemia, chronic pain from spinal stenosis and degenerative disc disease being admitted to the hospital with acute on chronic pain as well as chest pain.  He tells me that he has a long history of chronic pain, starting in 1975 when he broke his left hip while on the way to being deployed to [place].  About 5 years ago, he had a severe complex fracture of the right hip after falling off of a ladder and has had more severe chronic pain since that time.  He has seen multiple pain management doctors, currently followed by Dr. Fairy Badder at [facility] pain management center.  States that his chronic pain is in his right hip and leg, low back, and neck.  For the last 3 days, this pain has been much more severe.  He denies any recent fevers, chills, nausea, vomiting, shortness of breath, cough, injury or falls.  At baseline, he gets around his house in a wheelchair and walker.  He lives by himself.  For the last 3 days, his pain has been so severe he has not been able to eat, he has hardly been able to maneuver himself in his home, has not been able to transfer himself from bed to wheelchair, etc. as he normally does.  He called EMS for this severe pain.  Also starting a couple of days ago, he started having some substernal chest pain, it seems to come and go and not be changed by position or movement.  He has had chest pain in the past, states that he had a heart catheterization at [facility] last year, was told that he had some plaque disease, but no lesions that required intervention.  Currently his chronic pain is managed by [place]  patch, and he is wearing 1 now.   Subjective: Patient states he is not had a bowel movement or flatus.  No abdominal pain  or belching.  Trial of Dulcolax suppository.  If that does not work consider technical sales engineer.  Advised him that his bowels are not moving secondary to narcotic use.  He still has his buprenorphine  patch in place states it needs to be changed tomorrow.  Denies any fecal or urinary incontinence.  Case management working on placement.  [facility].  Hospital Course: No notes on file   Assessment and Plan:  1.  Chronic pain syndrome with sciatica.  - Followed by pain clinic.  Has had multiple injections in his back.  He tells me he does not have a pain contract which is hard to believe.  Tells me other than aspirin  and the buprenorphine  patch he takes no other medications for pain.  2.  Chest pain.  Ruled out.  No further workup.  Continue his usual cardiac medications.  3.  Suicidal ideation.  He denies today.  Appreciate behavioral health consult.  4.  Constipation.  Secondary to inactivity and narcotic use.  DC IV Dilaudid .  May use oxycodone  for breakthrough pain.  Dulcolax suppository and consider a trial of Relistor  every other day.    DVT prophylaxis: enoxaparin  (LOVENOX ) injection 40 mg Start: 03/21/24 1200     Code Status: Full Code Family Communication: Lives alone. Disposition Plan: Looks  like he will need a SNF Reason for continuing need for hospitalization: Pain control and mobility.  Objective: Vitals:   03/23/24 1412 03/23/24 1947 03/24/24 0447 03/24/24 1501  BP: 125/68 110/65 125/72 138/80  Pulse: 87 98 84 94  Resp: 20 15 15 20   Temp: 98.9 F (37.2 C) 98.9 F (37.2 C) 98.7 F (37.1 C) 98.5 F (36.9 C)  TempSrc: Oral Oral Oral Oral  SpO2: 94% 95% 94% 99%  Weight:      Height:        Intake/Output Summary (Last 24 hours) at 03/24/2024 1549 Last data filed at 03/24/2024 0516 Gross per 24 hour  Intake 240 ml  Output 1150 ml  Net -910 ml   Filed Weights   03/21/24 0309  Weight: 79.4 kg     Examination: Chronically ill but nontoxic HEENT: PERRLA EOMI oropharynx clear Neck is supple without meningismus Cardiovascular: Regular rate and rhythm S1-S2 no murmurs rubs gallops  lungs are clear throughout Abdomen is soft, bowel sounds are present Extremities are warm well-perfused no edema +2 pulses in the feet.  Able to move the feet.  Data Reviewed: I have personally reviewed following labs and imaging studies  CBC: Recent Labs  Lab 03/21/24 0355 03/22/24 0547  WBC 6.9 4.4  NEUTROABS 4.9  --   HGB 11.8* 11.4*  HCT 37.3* 35.7*  MCV 87.4 87.3  PLT 347 342   Basic Metabolic Panel: Recent Labs  Lab 03/21/24 0355 03/22/24 0547  NA 140 139  K 3.8 3.7  CL 106 103  CO2 25 26  GLUCOSE 108* 82  BUN 23 16  CREATININE 0.76 0.73  CALCIUM  9.0 9.2   GFR: Estimated Creatinine Clearance: 97.9 mL/min (by C-G formula based on SCr of 0.73 mg/dL). Liver Function Tests: Recent Labs  Lab 03/21/24 0355  AST 25  ALT 8  ALKPHOS 43  BILITOT <0.2  PROT 7.2  ALBUMIN 3.9   No results for input(s): LIPASE, AMYLASE in the last 168 hours. No results for input(s): AMMONIA in the last 168 hours. Coagulation Profile: No results for input(s): INR, PROTIME in the last 168 hours. Cardiac Enzymes: No results for input(s): CKTOTAL, CKMB, CKMBINDEX, TROPONINI in the last 168 hours. ProBNP, BNP (last 5 results) No results for input(s): PROBNP, BNP in the last 8760 hours. HbA1C: No results for input(s): HGBA1C in the last 72 hours. CBG: No results for input(s): GLUCAP in the last 168 hours. Lipid Profile: No results for input(s): CHOL, HDL, LDLCALC, TRIG, CHOLHDL, LDLDIRECT in the last 72 hours. Thyroid Function Tests: No results for input(s): TSH, T4TOTAL, FREET4, T3FREE, THYROIDAB in the last 72 hours. Anemia Panel: No results for input(s): VITAMINB12, FOLATE, FERRITIN, TIBC, IRON, RETICCTPCT in the last 72  hours. Sepsis Labs: No results for input(s): PROCALCITON, LATICACIDVEN in the last 168 hours.  No results found for this or any previous visit (from the past 240 hours).   Radiology Studies: No results found.   Scheduled Meds:  amLODipine   5 mg Oral Daily   aspirin  EC  81 mg Oral Daily   atorvastatin   40 mg Oral QHS   [START ON 03/25/2024] buprenorphine   1 patch Transdermal Weekly   Chlorhexidine  Gluconate Cloth  6 each Topical Daily   docusate sodium   200 mg Oral Daily   enoxaparin  (LOVENOX ) injection  40 mg Subcutaneous Q24H   famotidine   20 mg Oral BID   feeding supplement  237 mL Oral BID BM   hydrochlorothiazide   12.5 mg Oral Daily  losartan   50 mg Oral Daily   mirtazapine   15 mg Oral QHS   pantoprazole   40 mg Oral Daily   tamsulosin   0.4 mg Oral Daily   Continuous Infusions:   LOS: 2 days   Time spent: 35 minutes  Lonni KANDICE Moose, MD  [facility]  03/24/2024, 3:49 PM   "

## 2024-03-27 ENCOUNTER — Ambulatory Visit (INDEPENDENT_AMBULATORY_CARE_PROVIDER_SITE_OTHER): Payer: Self-pay

## 2024-04-04 ENCOUNTER — Ambulatory Visit (HOSPITAL_BASED_OUTPATIENT_CLINIC_OR_DEPARTMENT_OTHER): Admitting: Nurse Practitioner

## 2024-04-05 ENCOUNTER — Ambulatory Visit (HOSPITAL_BASED_OUTPATIENT_CLINIC_OR_DEPARTMENT_OTHER): Admitting: Nurse Practitioner

## 2024-06-09 ENCOUNTER — Ambulatory Visit (HOSPITAL_BASED_OUTPATIENT_CLINIC_OR_DEPARTMENT_OTHER): Admitting: Student in an Organized Health Care Education/Training Program
